# Patient Record
Sex: Female | Born: 1940 | Race: White | Hispanic: No | Marital: Married | Smoking: Never smoker
Health system: Southern US, Community
[De-identification: ages and names within clinical notes are randomized; demographics above are authoritative.]

## PROBLEM LIST (undated history)

## (undated) DIAGNOSIS — Z8719 Personal history of other diseases of the digestive system: Secondary | ICD-10-CM

## (undated) DIAGNOSIS — W19XXXA Unspecified fall, initial encounter: Secondary | ICD-10-CM

## (undated) DIAGNOSIS — J45909 Unspecified asthma, uncomplicated: Secondary | ICD-10-CM

## (undated) DIAGNOSIS — D126 Benign neoplasm of colon, unspecified: Secondary | ICD-10-CM

## (undated) DIAGNOSIS — E119 Type 2 diabetes mellitus without complications: Secondary | ICD-10-CM

## (undated) DIAGNOSIS — J309 Allergic rhinitis, unspecified: Secondary | ICD-10-CM

## (undated) DIAGNOSIS — I10 Essential (primary) hypertension: Secondary | ICD-10-CM

## (undated) DIAGNOSIS — K317 Polyp of stomach and duodenum: Secondary | ICD-10-CM

## (undated) DIAGNOSIS — K7460 Unspecified cirrhosis of liver: Secondary | ICD-10-CM

## (undated) DIAGNOSIS — Z8659 Personal history of other mental and behavioral disorders: Secondary | ICD-10-CM

## (undated) DIAGNOSIS — K219 Gastro-esophageal reflux disease without esophagitis: Secondary | ICD-10-CM

## (undated) DIAGNOSIS — R9431 Abnormal electrocardiogram [ECG] [EKG]: Secondary | ICD-10-CM

## (undated) DIAGNOSIS — D696 Thrombocytopenia, unspecified: Secondary | ICD-10-CM

## (undated) DIAGNOSIS — I2510 Atherosclerotic heart disease of native coronary artery without angina pectoris: Secondary | ICD-10-CM

## (undated) DIAGNOSIS — R29898 Other symptoms and signs involving the musculoskeletal system: Secondary | ICD-10-CM

## (undated) DIAGNOSIS — Z973 Presence of spectacles and contact lenses: Secondary | ICD-10-CM

## (undated) DIAGNOSIS — K2210 Ulcer of esophagus without bleeding: Secondary | ICD-10-CM

## (undated) DIAGNOSIS — E785 Hyperlipidemia, unspecified: Secondary | ICD-10-CM

## (undated) DIAGNOSIS — G8929 Other chronic pain: Secondary | ICD-10-CM

## (undated) DIAGNOSIS — M25519 Pain in unspecified shoulder: Secondary | ICD-10-CM

## (undated) DIAGNOSIS — K2950 Unspecified chronic gastritis without bleeding: Secondary | ICD-10-CM

## (undated) DIAGNOSIS — D61818 Other pancytopenia: Secondary | ICD-10-CM

## (undated) DIAGNOSIS — R42 Dizziness and giddiness: Secondary | ICD-10-CM

## (undated) DIAGNOSIS — K859 Acute pancreatitis without necrosis or infection, unspecified: Secondary | ICD-10-CM

## (undated) DIAGNOSIS — E669 Obesity, unspecified: Secondary | ICD-10-CM

## (undated) DIAGNOSIS — G609 Hereditary and idiopathic neuropathy, unspecified: Secondary | ICD-10-CM

## (undated) HISTORY — PX: OTHER SURGICAL HISTORY: SHX169

## (undated) HISTORY — DX: Polyp of stomach and duodenum: K31.7

## (undated) HISTORY — DX: Personal history of other mental and behavioral disorders: Z86.59

## (undated) HISTORY — PX: ABDOMINAL HYSTERECTOMY: SHX81

## (undated) HISTORY — DX: Benign neoplasm of colon, unspecified: D12.6

## (undated) HISTORY — PX: BREAST SURGERY: SHX581

## (undated) HISTORY — DX: Obesity, unspecified: E66.9

## (undated) HISTORY — DX: Unspecified chronic gastritis without bleeding: K29.50

## (undated) HISTORY — DX: Thrombocytopenia, unspecified: D69.6

## (undated) HISTORY — DX: Unspecified fall, initial encounter: W19.XXXA

## (undated) HISTORY — PX: PARTIAL GASTRECTOMY: SHX2172

## (undated) HISTORY — DX: Hyperlipidemia, unspecified: E78.5

## (undated) HISTORY — PX: TUBAL LIGATION: SHX77

## (undated) HISTORY — PX: EYE SURGERY: SHX253

## (undated) HISTORY — DX: Hereditary and idiopathic neuropathy, unspecified: G60.9

## (undated) HISTORY — DX: Essential (primary) hypertension: I10

## (undated) HISTORY — DX: Unspecified asthma, uncomplicated: J45.909

## (undated) HISTORY — DX: Personal history of other diseases of the digestive system: Z87.19

## (undated) HISTORY — DX: Ulcer of esophagus without bleeding: K22.10

## (undated) HISTORY — DX: Atherosclerotic heart disease of native coronary artery without angina pectoris: I25.10

## (undated) HISTORY — DX: Allergic rhinitis, unspecified: J30.9

## (undated) HISTORY — DX: Type 2 diabetes mellitus without complications: E11.9

---

## 1999-09-13 ENCOUNTER — Ambulatory Visit (HOSPITAL_COMMUNITY): Admission: RE | Admit: 1999-09-13 | Discharge: 1999-09-13 | Payer: Self-pay | Admitting: Specialist

## 2000-10-09 ENCOUNTER — Encounter (INDEPENDENT_AMBULATORY_CARE_PROVIDER_SITE_OTHER): Payer: Self-pay | Admitting: Specialist

## 2000-10-09 ENCOUNTER — Other Ambulatory Visit: Admission: RE | Admit: 2000-10-09 | Discharge: 2000-10-09 | Payer: Self-pay | Admitting: Gastroenterology

## 2001-06-11 ENCOUNTER — Ambulatory Visit (HOSPITAL_COMMUNITY): Admission: RE | Admit: 2001-06-11 | Discharge: 2001-06-11 | Payer: Self-pay | Admitting: Specialist

## 2001-06-16 ENCOUNTER — Ambulatory Visit (HOSPITAL_COMMUNITY): Admission: RE | Admit: 2001-06-16 | Discharge: 2001-06-16 | Payer: Self-pay | Admitting: Specialist

## 2001-08-21 ENCOUNTER — Ambulatory Visit (HOSPITAL_COMMUNITY): Admission: RE | Admit: 2001-08-21 | Discharge: 2001-08-21 | Payer: Self-pay

## 2001-11-12 HISTORY — PX: FOOT SURGERY: SHX648

## 2001-11-12 HISTORY — PX: TUMOR EXCISION: SHX421

## 2002-12-30 ENCOUNTER — Encounter: Payer: Self-pay | Admitting: *Deleted

## 2002-12-30 ENCOUNTER — Inpatient Hospital Stay (HOSPITAL_COMMUNITY): Admission: AD | Admit: 2002-12-30 | Discharge: 2002-12-31 | Payer: Self-pay | Admitting: *Deleted

## 2003-12-01 ENCOUNTER — Emergency Department (HOSPITAL_COMMUNITY): Admission: EM | Admit: 2003-12-01 | Discharge: 2003-12-01 | Payer: Self-pay | Admitting: Emergency Medicine

## 2007-02-24 ENCOUNTER — Inpatient Hospital Stay (HOSPITAL_COMMUNITY): Admission: EM | Admit: 2007-02-24 | Discharge: 2007-02-25 | Payer: Self-pay | Admitting: *Deleted

## 2007-02-25 HISTORY — PX: CARDIAC CATHETERIZATION: SHX172

## 2007-07-29 ENCOUNTER — Ambulatory Visit (HOSPITAL_COMMUNITY): Admission: RE | Admit: 2007-07-29 | Discharge: 2007-07-29 | Payer: Self-pay | Admitting: Internal Medicine

## 2007-10-15 ENCOUNTER — Ambulatory Visit (HOSPITAL_COMMUNITY): Admission: RE | Admit: 2007-10-15 | Discharge: 2007-10-15 | Payer: Self-pay | Admitting: Internal Medicine

## 2008-03-19 ENCOUNTER — Ambulatory Visit (HOSPITAL_COMMUNITY): Admission: RE | Admit: 2008-03-19 | Discharge: 2008-03-19 | Payer: Self-pay | Admitting: Internal Medicine

## 2009-01-13 ENCOUNTER — Ambulatory Visit: Payer: Self-pay | Admitting: Cardiology

## 2009-02-04 DIAGNOSIS — E669 Obesity, unspecified: Secondary | ICD-10-CM | POA: Insufficient documentation

## 2009-02-04 DIAGNOSIS — I10 Essential (primary) hypertension: Secondary | ICD-10-CM | POA: Insufficient documentation

## 2009-02-04 DIAGNOSIS — E785 Hyperlipidemia, unspecified: Secondary | ICD-10-CM

## 2009-02-04 DIAGNOSIS — E119 Type 2 diabetes mellitus without complications: Secondary | ICD-10-CM | POA: Insufficient documentation

## 2009-02-18 ENCOUNTER — Ambulatory Visit: Payer: Self-pay | Admitting: Pulmonary Disease

## 2009-02-18 DIAGNOSIS — R0602 Shortness of breath: Secondary | ICD-10-CM

## 2009-02-18 DIAGNOSIS — J309 Allergic rhinitis, unspecified: Secondary | ICD-10-CM | POA: Insufficient documentation

## 2009-03-16 ENCOUNTER — Ambulatory Visit: Payer: Self-pay | Admitting: Pulmonary Disease

## 2009-03-16 DIAGNOSIS — J45909 Unspecified asthma, uncomplicated: Secondary | ICD-10-CM | POA: Insufficient documentation

## 2009-05-18 ENCOUNTER — Ambulatory Visit: Payer: Self-pay | Admitting: Pulmonary Disease

## 2009-12-04 ENCOUNTER — Observation Stay (HOSPITAL_COMMUNITY): Admission: EM | Admit: 2009-12-04 | Discharge: 2009-12-06 | Payer: Self-pay | Admitting: Emergency Medicine

## 2009-12-20 ENCOUNTER — Ambulatory Visit (HOSPITAL_COMMUNITY): Admission: RE | Admit: 2009-12-20 | Discharge: 2009-12-20 | Payer: Self-pay | Admitting: Family Medicine

## 2010-01-04 DIAGNOSIS — D696 Thrombocytopenia, unspecified: Secondary | ICD-10-CM

## 2010-01-04 DIAGNOSIS — G609 Hereditary and idiopathic neuropathy, unspecified: Secondary | ICD-10-CM

## 2010-01-04 DIAGNOSIS — Z8659 Personal history of other mental and behavioral disorders: Secondary | ICD-10-CM

## 2010-01-04 DIAGNOSIS — I2510 Atherosclerotic heart disease of native coronary artery without angina pectoris: Secondary | ICD-10-CM

## 2010-01-21 ENCOUNTER — Observation Stay (HOSPITAL_COMMUNITY): Admission: EM | Admit: 2010-01-21 | Discharge: 2010-01-24 | Payer: Self-pay | Admitting: Emergency Medicine

## 2010-01-21 ENCOUNTER — Ambulatory Visit: Payer: Self-pay | Admitting: Cardiology

## 2010-01-23 ENCOUNTER — Ambulatory Visit: Payer: Self-pay | Admitting: Surgery

## 2010-01-23 ENCOUNTER — Encounter (INDEPENDENT_AMBULATORY_CARE_PROVIDER_SITE_OTHER): Payer: Self-pay | Admitting: Internal Medicine

## 2010-01-25 ENCOUNTER — Inpatient Hospital Stay (HOSPITAL_COMMUNITY): Admission: EM | Admit: 2010-01-25 | Discharge: 2010-01-28 | Payer: Self-pay | Admitting: Emergency Medicine

## 2010-02-03 ENCOUNTER — Encounter: Payer: Self-pay | Admitting: Internal Medicine

## 2010-02-07 ENCOUNTER — Ambulatory Visit: Payer: Self-pay | Admitting: Internal Medicine

## 2010-02-07 DIAGNOSIS — K7460 Unspecified cirrhosis of liver: Secondary | ICD-10-CM | POA: Insufficient documentation

## 2010-02-10 ENCOUNTER — Encounter: Payer: Self-pay | Admitting: Internal Medicine

## 2010-02-13 ENCOUNTER — Telehealth (INDEPENDENT_AMBULATORY_CARE_PROVIDER_SITE_OTHER): Payer: Self-pay | Admitting: *Deleted

## 2010-02-13 DIAGNOSIS — Z860100 Personal history of colon polyps, unspecified: Secondary | ICD-10-CM | POA: Insufficient documentation

## 2010-02-13 LAB — CONVERTED CEMR LAB
ALT: 24 units/L (ref 0–35)
AST: 24 units/L (ref 0–37)
Albumin: 4.1 g/dL (ref 3.5–5.2)
Alkaline Phosphatase: 84 units/L (ref 39–117)
BUN: 13 mg/dL (ref 6–23)
Basophils Absolute: 0.1 10*3/uL (ref 0.0–0.1)
Basophils Relative: 1 % (ref 0–1)
Ceruloplasmin: 35 mg/dL (ref 21–63)
Creatinine, Ser: 0.58 mg/dL (ref 0.40–1.20)
Eosinophils Relative: 3 % (ref 0–5)
Glucose, Bld: 172 mg/dL — ABNORMAL HIGH (ref 70–99)
Hepatitis B Surface Ag: NEGATIVE
INR: 1.13 (ref <1.50)
Iron: 136 ug/dL (ref 42–145)
Lymphs Abs: 2 10*3/uL (ref 0.7–4.0)
MCHC: 34.1 g/dL (ref 30.0–36.0)
Monocytes Absolute: 0.3 10*3/uL (ref 0.1–1.0)
Neutro Abs: 2.5 10*3/uL (ref 1.7–7.7)
Prothrombin Time: 14.4 s (ref 11.6–15.2)
Saturation Ratios: 41 % (ref 20–55)
Sodium: 140 meq/L (ref 135–145)
TIBC: 332 ug/dL (ref 250–470)
Total Protein: 6.2 g/dL (ref 6.0–8.3)
UIBC: 196 ug/dL
WBC: 5 10*3/uL (ref 4.0–10.5)

## 2010-02-14 ENCOUNTER — Encounter: Payer: Self-pay | Admitting: Internal Medicine

## 2010-03-02 ENCOUNTER — Telehealth (INDEPENDENT_AMBULATORY_CARE_PROVIDER_SITE_OTHER): Payer: Self-pay

## 2010-03-08 ENCOUNTER — Ambulatory Visit (HOSPITAL_COMMUNITY): Admission: RE | Admit: 2010-03-08 | Discharge: 2010-03-08 | Payer: Self-pay | Admitting: Internal Medicine

## 2010-03-08 ENCOUNTER — Ambulatory Visit: Payer: Self-pay | Admitting: Internal Medicine

## 2010-03-08 DIAGNOSIS — D126 Benign neoplasm of colon, unspecified: Secondary | ICD-10-CM

## 2010-03-08 DIAGNOSIS — Z8719 Personal history of other diseases of the digestive system: Secondary | ICD-10-CM

## 2010-03-08 DIAGNOSIS — K2210 Ulcer of esophagus without bleeding: Secondary | ICD-10-CM

## 2010-03-08 DIAGNOSIS — K317 Polyp of stomach and duodenum: Secondary | ICD-10-CM

## 2010-03-08 HISTORY — PX: ESOPHAGOGASTRODUODENOSCOPY: SHX1529

## 2010-03-08 HISTORY — PX: COLONOSCOPY: SHX174

## 2010-03-08 HISTORY — DX: Benign neoplasm of colon, unspecified: D12.6

## 2010-03-08 HISTORY — DX: Polyp of stomach and duodenum: K31.7

## 2010-03-08 HISTORY — DX: Personal history of other diseases of the digestive system: Z87.19

## 2010-03-08 HISTORY — DX: Ulcer of esophagus without bleeding: K22.10

## 2010-03-10 ENCOUNTER — Encounter: Payer: Self-pay | Admitting: Internal Medicine

## 2010-04-11 ENCOUNTER — Ambulatory Visit: Payer: Self-pay | Admitting: Internal Medicine

## 2010-09-27 ENCOUNTER — Ambulatory Visit: Payer: Self-pay | Admitting: Internal Medicine

## 2010-09-29 DIAGNOSIS — K7581 Nonalcoholic steatohepatitis (NASH): Secondary | ICD-10-CM

## 2010-09-29 DIAGNOSIS — K219 Gastro-esophageal reflux disease without esophagitis: Secondary | ICD-10-CM | POA: Insufficient documentation

## 2010-09-29 DIAGNOSIS — K7460 Unspecified cirrhosis of liver: Secondary | ICD-10-CM | POA: Insufficient documentation

## 2010-10-04 ENCOUNTER — Encounter: Payer: Self-pay | Admitting: Internal Medicine

## 2010-11-28 ENCOUNTER — Telehealth (INDEPENDENT_AMBULATORY_CARE_PROVIDER_SITE_OTHER): Payer: Self-pay

## 2010-12-05 ENCOUNTER — Encounter: Payer: Self-pay | Admitting: Internal Medicine

## 2010-12-08 ENCOUNTER — Ambulatory Visit (HOSPITAL_COMMUNITY)
Admission: RE | Admit: 2010-12-08 | Discharge: 2010-12-08 | Payer: Self-pay | Source: Home / Self Care | Attending: Internal Medicine | Admitting: Internal Medicine

## 2010-12-12 ENCOUNTER — Encounter: Payer: Self-pay | Admitting: Internal Medicine

## 2010-12-12 NOTE — Letter (Signed)
Summary: REFERRAL FROM [facility]  REFERRAL FROM [facility]   Imported By: Rexene Alberts 10/04/2010 16:00:49  _____________________________________________________________________  External Attachment:    Type:   Image     Comment:   External Document

## 2010-12-12 NOTE — Assessment & Plan Note (Signed)
Summary: CIRRHOSIS/SS   Visit Type:  Follow-up Visit Primary Care Provider:  golding  Chief Complaint:  follow up visit- doing ok.  History of Present Illness: 70 year old lady with [name] [name] cirrhosis returns for 6 month followup. She's done well. Her ammonia was slightly elevated recently and her lactulose was increased. She takes 30 cc daily. Husband states she's been doing well; she has had no melena, hematochezia or  hematemesis. History of colonic adenoma. Due for surveillance exam for half years from now. She'll be due for a screening esophageal varices one year from now. She's been vaccinated against hepatitis A and B.  She has cut back on her dietary intake.  Reflux symptoms well-controlled.  Current Problems (verified): 1)  Colonic Polyps, Hx of  (ICD-V12.72) 2)  Cirrhosis  (ICD-571.5) 3)  Cad, Native Vessel  (ICD-414.01) 4)  Thrombocytopenia, Chronic  (ICD-287.5) 5)  Peripheral Neuropathy  (ICD-356.9) 6)  Accidental Falls, Recurrent  (ICD-E888.9) 7)  Hyperlipidemia  (ICD-272.4) 8)  Hypertension  (ICD-401.9) 9)  Obesity  (ICD-278.00) 10)  Dyspnea  (ICD-786.05) 11)  Anxiety Disorder, Hx of  (ICD-V11.2) 12)  Diabetes, Type 2  (ICD-250.00) 13)  Intrinsic Asthma, Unspecified  (ICD-493.10) 14)  Allergic Rhinitis  (ICD-477.9)  Current Medications (verified): 1)  Bayer Low Strength 81 Mg Tbec (Aspirin) .... Take 1 Tablet By Mouth Once A Day 2)  Centrum Silver  Tabs (Multiple Vitamins-Minerals) .... Take 1 Tablet By Mouth Once A Day 3)  Novolog 100 Unit/ml Soln (Insulin Aspart) .... Sliding Scale 4)  Metoprolol Tartrate 25 Mg Tabs (Metoprolol Tartrate) .Marland Kitchen.. 1 Tab At Bedtime 5)  Symbicort 80-4.5 Mcg/act Aero (Budesonide-Formoterol Fumarate) .... 2 Puffs Two Times A Day 6)  Nexium 40 Mg Cpdr (Esomeprazole Magnesium) .Marland Kitchen.. 1 Tab Once Daily 7)  Alprazolam 0.5 Mg Tabs (Alprazolam) .Marland Kitchen.. 1 Tab Every 8 Hours As Needed 8)  Novolin N 100 Unit/ml Susp (Insulin Isophane Human) .... 70 Units in Am  60 Units Q Pm 9)  Lactulose Encephalopathy 10 Gm/97ml Soln (Lactulose Encephalopathy) .... 30 Ml Once Daily  Allergies (verified): 1)  ! Compazine  Past History:  Past Medical History: Last updated: 02/07/2010 CAD, NATIVE VESSEL (ICD-414.01) THROMBOCYTOPENIA, CHRONIC (ICD-287.5) PERIPHERAL NEUROPATHY (ICD-356.9) ACCIDENTAL FALLS, RECURRENT (ICD-E888.9) HYPERLIPIDEMIA (ICD-272.4) HYPERTENSION (ICD-401.9) OBESITY (ICD-278.00) DYSPNEA (ICD-786.05) ANXIETY DISORDER, HX OF (ICD-V11.2) DIABETES, TYPE 2 (ICD-250.00) INTRINSIC ASTHMA, UNSPECIFIED (ICD-493.10) ALLERGIC RHINITIS (ICD-477.9) high amonia levels  Past Surgical History: Last updated: 02/18/2009 s/p hysterectomy and btl  Family History: Last updated: 01/04/2010 Family history of CAD and diabetes heart disease: mother, father  Mainly coronary artery disease and cancer.  Also,   diabetes runs in her family.  The patient has 3 children and many   grandchildren, all relatively healthy.   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 She is accompanied by her Lungs:  clear to auscultation Heart:  regular rate and rhythm without murmur gallop or Abdomen:  obese positive bowel sounds soft nontender liver edge indistinct. Spleen not definitely ballotable. No shifting dullness. The abdomen is soft and nontender  Impression & Recommendations: Impression: 70 year old lady with  [name] [name] cirrhosis well compensated at this time. History of a colonic adenoma. No prior varices on EGD. She may have had some low-grade encephalopathy since she was last seen. She's tolerating her lactulose veery welll.  Recommendations: Continue lactulose as per plan. She may take an extra dose during the day if she doesn't have 3-4 semiformed BM's daily. Plan for repeat colonoscopy four and a  half years. EGD in one year. Would like to retrieve the labs done through Dr. Beatrice Lecher office. She may be due for ultrasound and alpha-fetoprotein tumor marker now.  Further recommendations to follow.  Other Orders: Est. Patient Level IV ([address]109)

## 2010-12-12 NOTE — Letter (Signed)
Summary: Patient Notice, Colon Biopsy Results  [facility]  [address]   [place], [place] [address]   Phone: [phone]  Fax: [phone]       March 10, 2010   [name] [name] [address] [place], [place]  [address] Mar 19, 1941    Dear Ms. Ostergaard,  I am pleased to inform you that the biopsies taken during your recent colonoscopy did not show any evidence of cancer upon pathologic examination. Stomach biopsies revealed only mild inflammation.  Additional information/recommendations:  Continue with the treatment plan as outlined on the day of your exam.  You should have a repeat colonoscopy examination  in 5 years.  You should have a repeat EGD to check for esophageal varices in 18 months  Please call us if you are having persistent problems or have questions about your condition that have not been fully answered at this time.  Sincerely,    R. Roetta Sessions MD, FACP [facility] Ph: [phone]    Fax: [phone]   Appended Document: Patient Notice, Colon Biopsy Results letter mailed to pt  Appended Document: Patient Notice, Colon Biopsy Results Reminder appts made for EGD & TCS- cdg

## 2010-12-12 NOTE — Progress Notes (Signed)
Summary: pt came by for prep for tcs/instructions  Phone Note Call from Patient   Caller: Patient Summary of Call: Pt came by to pick-up prep and instructions. Reviewed the instructions with her. She asked about her insulin, and said she has  not been having to use it for over a week. BS running around 120. I told her i would let Dr. Darrick Penna know since Dr. Jena Gauss is out of town. She also said she is having alot of cramps in her legs and thinks her potassium might be low. Please advise. Initial call taken by: Cloria Spring LPN,  March 02, 2010 2:25 PM     Appended Document: pt came by for prep for tcs/instructions Please call pt. If she is not using her insulin then no adjustment needs to be made. If she thinks her potassium is low she should see her PCP for a blood draw.  Appended Document: pt came by for prep for tcs/instructions LM for pt to call.  Appended Document: pt came by for prep for tcs/instructions Pt informed. She will try to see someone at [place] today.

## 2010-12-12 NOTE — Assessment & Plan Note (Signed)
Summary: f/u GERD   Visit Type:  Follow-up Consult Primary Care Provider:  cresenzo  Chief Complaint:  follow up- gerd and high amonia levels.  History of Present Illness: 70 year old lady sent over out of the courtesy of Dr. Nobie Putnam to further evaluate a couple month history of intermittent confusion, word finding difficulties, vertigo symptoms and an elevated ammonia level. She is has been seen at the emergency department on multiple occasions. She has been seen by 2 different neurologistss.  Recently hospitalized here and was found to have a nodular liver and small gallstones and an enlarged spleen.  She has had intermittent episodes of confusion. She was started on lactulose syrup and dose been titrated. She takes 3 tablespoons 2-3 times daily and has 1-2 formed bowel movements daily. Recently, her blood work from  Dr. Geanie Logan office indicated her ammonia was elevated at 96  LFTs were good except for a slightly elevated total bilirubin indirect and direct component. So far she knows, she has not had a serological workup as of yet in determining the cause of cirrhosis. She is a long-term obese diabetic. She denies any history of yellow jaundice or or been told previously she had liver disease; she gives a history a single blood transfusion as child in [place] for "not eating".  There is no history of alcohol consumption;  no family history of liver disease. There is no history of illicit/ parenteral drug use.   During the last couple months of workup she has become quite anxious and agitated at times. She has been started on alprazolam which he takes at bedtime.  Past GI history is significant for colonic polyps. She previously had a colonoscopy by Dr. Sheryn Bison down in [place]. She is way overdue for surveillance exam as she reports.   Current Medications (verified): 1)  Bayer Low Strength 81 Mg Tbec (Aspirin) .... Take 1 Tablet By Mouth Once A Day 2)  Centrum Silver   Tabs (Multiple Vitamins-Minerals) .... Take 1 Tablet By Mouth Once A Day 3)  Novolog 100 Unit/ml Soln (Insulin Aspart) .... Sliding Scale 4)  Humulin N 100 Unit/ml Susp (Insulin Isophane Human) .... Use As Directed 5)  Metoprolol Tartrate 25 Mg Tabs (Metoprolol Tartrate) .Marland Kitchen.. 1 Tab At Bedtime 6)  Symbicort 80-4.5 Mcg/act Aero (Budesonide-Formoterol Fumarate) .... 2 Puffs Two Times A Day 7)  Nexium 40 Mg Cpdr (Esomeprazole Magnesium) .Marland Kitchen.. 1 Tab Once Daily 8)  Alprazolam 0.5 Mg Tabs (Alprazolam) .Marland Kitchen.. 1 Tab Every 8 Hours As Needed 9)  Novolin N 100 Unit/ml Susp (Insulin Isophane Human) .... 60 Units Three Times A Day 10)  Lactulose Encephalopathy 10 Gm/20ml Soln (Lactulose Encephalopathy) .... 30 Ml Two Times A Day  Allergies (verified): 1)  ! Compazine  Past History:  Past Surgical History: Last updated: 02/18/2009 s/p hysterectomy and btl  Risk Factors: Smoking Status: never (02/18/2009)  Past Medical History: CAD, NATIVE VESSEL (ICD-414.01) THROMBOCYTOPENIA, CHRONIC (ICD-287.5) PERIPHERAL NEUROPATHY (ICD-356.9) ACCIDENTAL FALLS, RECURRENT (ICD-E888.9) HYPERLIPIDEMIA (ICD-272.4) HYPERTENSION (ICD-401.9) OBESITY (ICD-278.00) DYSPNEA (ICD-786.05) ANXIETY DISORDER, HX OF (ICD-V11.2) DIABETES, TYPE 2 (ICD-250.00) INTRINSIC ASTHMA, UNSPECIFIED (ICD-493.10) ALLERGIC RHINITIS (ICD-477.9) high amonia levels  Family History: Reviewed history from 01/04/2010 and no changes required. Family history of CAD and diabetes heart disease: mother, father  Mainly coronary artery disease and cancer.  Also,   diabetes runs in her family.  The patient has 3 children and many   grandchildren, all relatively healthy.   Social History: Reviewed history from 01/04/2010 and no changes required. 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 not jaundiced. Do not see much in the way of any spider angiomata. She does have mild asterixis( right greater than left) Eyes:  no scleral icterus present time are pink Lungs:  clear to auscultation Heart:  regular rate and rhythm without murmur gallop rub Abdomen:  obese positive bowel sounds soft no shifting dullness or fluid wave. Abdomen is nontender. Spleen is ballotable. Liver is not enlarged by percussion. She's a right costal margin Rectal:  deferred Colonoscopy  Impression & Recommendations: Impression: Very pleasant 70 year old lady who appears to have a new diagnosis of cirrhosis, etiology not clear. It appears that some of her mental status changes are likely related to encephalopathy. Her ataxia and focal motor deficits may not all be attributable to hepatic encephalopathy. 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 It is  possible she has 2 different processes contributing to her neurological problems. Findings on ultrasound are impressive for cirrhosis. Differential diagnosis  would include steatohepatitis secondary to obesity and diabetes, or occult viral hepatitis B or C., iron overload or burned out autoimmune hepatitis.  Recommendations: Assess hepatitis A BC status; she may need vaccination.  Titrate lactulose to 3-4 semi-formed almost diarrhea stools daily. I've asked her to increase the lactulose to 45 cc orally q.i.d. One or 2 bowel movements daily is not sufficient in this setting.  Baseline alpha-fetoprotein tumor marker  Serological workup for cause of liver disease including  hepatitis B. surface antigen hepatitis C antibody, iron studies, repeat chem 20 and CBC today along with autoimmune markers including ANA and anti-smooth muscle antibody along her serum immunoglobulins  She has a history of colonic polyps and is a overdo for surveillance.  She needs to be screened for esophageal varices via  EGD.  I've offered this nice lady both a surveillance EGD and colonoscopy in the near future at the hospital. She states that her colonoscopy last time was a terrible experience; she felt everything and it hurt". Therefore, will in list the services of Dr. Jayme Cloud and associates to provide [place] MAC anesthesia in the OR. Potential for esophageal band ligation has been reviewed;  risks, benefits, alternatives and imponderables have been reviewed. Her questions were answered Her and her husband are agreeable.   We'll like further recommendations in the very near future.  Other Orders: T-AFP Tumor Markers ([id number]) T-Hepatitis B Surface Antigen [phone]) T-Hepatitis B Surface Antibody ([id number]) T-Hepatitis A Antibody ([id number]) T-Hepatitis C Antibody ([id number]) T-Iron Binding Capacity (TIBC) ([address]) T-Ferritin (40347-42595) Augusto Gamble ([id number]) T-ANA [phone]) T-Immunoglobulins, Quantitative (63016) T-Anti SMA ([id number]) T-Comprehensive Metabolic Panel [phone]) T-CBC w/Diff ([id number]) T-PT (Prothrombin Time) (17616) T-Ceruloplasmin ([id number])  Appended Document: Orders Update    Clinical Lists Changes  Problems: Added new problem of COLONIC POLYPS, HX OF (ICD-V12.72) Orders: Added new Service  order of Consultation Level IV [phone]) - Signed

## 2010-12-12 NOTE — Progress Notes (Signed)
Summary: Prep Instructions from Dr Jena Gauss  ---- Converted from flag ---- ---- 02/13/2010 1:02 PM, Jonathon Bellows MD, Caleen Essex wrote: OK... Lets try the new prep and see what happens /thx  ---- 02/13/2010 1:01 PM, Ave Filter wrote: This is the one where the pt stated she couldn't drink Colytley..You said lets wait until her labs come back and you will have a game plan.  ---- 02/13/2010 12:55 PM, R. Roetta Sessions MD, Caleen Essex wrote: what am I missing; I thought we were going to set up both an egd and tcs  ---- 02/13/2010 12:23 PM, Ave Filter wrote: What do you want to do about colonoscopy? ------------------------------

## 2010-12-12 NOTE — Letter (Signed)
Summary: TCS/EGD ORDER  TCS/EGD ORDER   Imported By: Ave Filter 02/14/2010 09:36:36  _____________________________________________________________________  External Attachment:    Type:   Image     Comment:   External Document

## 2010-12-12 NOTE — Assessment & Plan Note (Signed)
Summary: PP FU IN ONE MONTH/PT WANTS RMR ONLY/SS   Chief Complaint:  follow up- doing ok.  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 No varices on recent EGD. She multiple colonic adenomas. She is due for surveillance colonoscopy 2016. Due for screening EGD in 18 months; due for alpha-fetoprotein repeat and ultrasound 6 months from now.  Husband states she is doing well;  taking lactulose as directed having 2-4 bowel movements daily. She has been immunized against hepatitis A and B. viral markers for B. and C. came back negative acid autoimmune and iron markers. She is mildly hypokalemic and that was supplemented. Overall, she feels she is doing well.  Recent CT demonstrated no evidence of hepatoma.  Current Problems (verified): 1)  Colonic Polyps, Hx of  (ICD-V12.72) 2)  Cirrhosis  (ICD-571.5) 3)  Cad, Native Vessel  (ICD-414.01) 4)  Thrombocytopenia, Chronic  (ICD-287.5) 5)  Peripheral Neuropathy  (ICD-356.9) 6)  Accidental Falls, Recurrent  (ICD-E888.9) 7)  Hyperlipidemia  (ICD-272.4) 8)  Hypertension  (ICD-401.9) 9)  Obesity  (ICD-278.00) 10)  Dyspnea  (ICD-786.05) 11)  Anxiety Disorder, Hx of  (ICD-V11.2) 12)  Diabetes, Type 2  (ICD-250.00) 13)  Intrinsic Asthma, Unspecified  (ICD-493.10) 14)  Allergic Rhinitis  (ICD-477.9)  Current Medications (verified): 1)  Bayer Low Strength 81 Mg Tbec (Aspirin) .... Take 1 Tablet By Mouth Once A Day 2)  Centrum Silver  Tabs (Multiple Vitamins-Minerals) .... Take 1 Tablet By Mouth Once A Day 3)  Novolog 100 Unit/ml Soln (Insulin Aspart) .... Sliding Scale 4)  Humulin N 100 Unit/ml Susp (Insulin Isophane Human) .... Use As Directed 5)  Metoprolol Tartrate 25 Mg Tabs (Metoprolol Tartrate) .Marland Kitchen.. 1 Tab At Bedtime 6)  Symbicort 80-4.5 Mcg/act Aero (Budesonide-Formoterol Fumarate) .... 2 Puffs Two Times A Day 7)  Nexium 40 Mg Cpdr (Esomeprazole Magnesium) .Marland Kitchen.. 1 Tab Once Daily 8)   Alprazolam 0.5 Mg Tabs (Alprazolam) .Marland Kitchen.. 1 Tab Every 8 Hours As Needed 9)  Novolin N 100 Unit/ml Susp (Insulin Isophane Human) .... 60 Units Once Daily 10)  Lactulose Encephalopathy 10 Gm/13ml Soln (Lactulose Encephalopathy) .... 30 Ml Once Daily  Allergies (verified): 1)  ! Compazine  Past History:  Past Medical History: Last updated: 02/07/2010 CAD, NATIVE VESSEL (ICD-414.01) THROMBOCYTOPENIA, CHRONIC (ICD-287.5) PERIPHERAL NEUROPATHY (ICD-356.9) ACCIDENTAL FALLS, RECURRENT (ICD-E888.9) HYPERLIPIDEMIA (ICD-272.4) HYPERTENSION (ICD-401.9) OBESITY (ICD-278.00) DYSPNEA (ICD-786.05) ANXIETY DISORDER, HX OF (ICD-V11.2) DIABETES, TYPE 2 (ICD-250.00) INTRINSIC ASTHMA, UNSPECIFIED (ICD-493.10) ALLERGIC RHINITIS (ICD-477.9) high amonia levels  Past Surgical History: Last updated: 02/18/2009 s/p hysterectomy and btl  Family History: Last updated: 01/04/2010 Family history of CAD and diabetes heart disease: mother, father  Mainly coronary artery disease and cancer.  Also,   diabetes runs in her family.  The patient has 3 children and many   grandchildren, all relatively healthy.   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 Accompanied by her husband. Eyes:  no scleral icterus Abdomen:  massively obese positive bowel sounds soft ID Bilotta spleen liver edge palpable right costal margin no obvious fluid wave or shifting dullness she  has trace lower extremity edema  Impression & Recommendations: Impression: Nash/cirrhosis well compensated this time -  no evidence of encephalopathy.  She is to continue her lactulose regimen and titrate to 3-4 bowel movements daily. Ultrasound and AFP in 6 months from now.  EGD 18 months from now.  History of GERD - stable on Nexium continued at regimen.  History of colonic polyps due for surveillance examination 2016.  Office visit here with me 6 months.  Appended Document: Orders Update    Clinical Lists Changes  Orders: Added new Service order of Est. Patient Level III (43329) - Signed      Appended Document: PP FU IN ONE MONTH/PT WANTS RMR ONLY/SS reminder in computer

## 2010-12-14 NOTE — Progress Notes (Addendum)
Summary: ? about labs and ultrasound  Phone Note Call from Patient Call back at Home Phone [phone]   Caller: Patient Summary of Call: pt called- wants to know if she needs to have ultrasound and afp done now. Her labs from [facility] did not include an afp. please advise.  Initial call taken by: Hendricks Limes LPN,  November 28, 2010 10:06 AM     Appended Document: ? about labs and ultrasound I do not know details regarding last u/s or afp; lets go ahead and get both now and then every 6 mos  Appended Document: ? about labs and ultrasound Informed pt. Lab order faxed to [facility] for the AFP and Durward Mallard will call her for the [place] appt.  Appended Document: ? about labs and ultrasound Pt scheduled for [place] on 12/08/10@11 :00a.m. Pt aware of appt.

## 2010-12-14 NOTE — Miscellaneous (Signed)
Summary: Orders Update  Clinical Lists Changes  Orders: Added new Test order of T-AFP Tumor Markers ([id number]) - Signed 

## 2010-12-20 NOTE — Miscellaneous (Signed)
Summary: Orders Update  Clinical Lists Changes  Orders: Added new Test order of T-AFP Tumor Markers ([id number]) - Signed 

## 2011-01-29 LAB — BASIC METABOLIC PANEL
BUN: 12 mg/dL (ref 6–23)
CO2: 24 mEq/L (ref 19–32)
Creatinine, Ser: 0.62 mg/dL (ref 0.4–1.2)
GFR calc non Af Amer: >60 mL/min (ref >60)
Glucose, Bld: 196 mg/dL — ABNORMAL HIGH (ref 70–99)
Potassium: 3.5 mEq/L (ref 3.5–5.1)

## 2011-01-29 LAB — COMPREHENSIVE METABOLIC PANEL
Albumin: 3.4 g/dL — ABNORMAL LOW (ref 3.5–5.2)
Alkaline Phosphatase: 49 U/L (ref 39–117)
BUN: 11 mg/dL (ref 6–23)
Calcium: 8.7 mg/dL (ref 8.4–10.5)
Creatinine, Ser: 0.72 mg/dL (ref 0.4–1.2)
Potassium: 3.7 mEq/L (ref 3.5–5.1)
Total Protein: 5.7 g/dL — ABNORMAL LOW (ref 6.0–8.3)

## 2011-01-29 LAB — CBC
HCT: 36 % (ref 36.0–46.0)
HCT: 37.7 % (ref 36.0–46.0)
Hemoglobin: 13.1 g/dL (ref 12.0–15.0)
MCHC: 34.9 g/dL (ref 30.0–36.0)
Platelets: 100 10*3/uL — ABNORMAL LOW (ref 150–400)
Platelets: 102 10*3/uL — ABNORMAL LOW (ref 150–400)
RBC: 4.32 MIL/uL (ref 3.87–5.11)
RDW: 14 % (ref 11.5–15.5)
RDW: 14.1 % (ref 11.5–15.5)

## 2011-01-29 LAB — DIFFERENTIAL
Basophils Absolute: 0 10*3/uL (ref 0.0–0.1)
Basophils Relative: 1 % (ref 0–1)
Eosinophils Relative: 3 % (ref 0–5)
Lymphocytes Relative: 45 % (ref 12–46)
Lymphs Abs: 1.8 10*3/uL (ref 0.7–4.0)
Monocytes Absolute: 0.3 10*3/uL (ref 0.1–1.0)
Monocytes Absolute: 0.4 10*3/uL (ref 0.1–1.0)
Monocytes Relative: 8 % (ref 3–12)
Neutro Abs: 1.8 10*3/uL (ref 1.7–7.7)

## 2011-01-29 LAB — URINE MICROSCOPIC-ADD ON

## 2011-01-29 LAB — TSH: TSH: 2.395 u[IU]/mL (ref 0.350–4.500)

## 2011-01-29 LAB — GLUCOSE, CAPILLARY
Glucose-Capillary: 172 mg/dL — ABNORMAL HIGH (ref 70–99)
Glucose-Capillary: 210 mg/dL — ABNORMAL HIGH (ref 70–99)
Glucose-Capillary: 264 mg/dL — ABNORMAL HIGH (ref 70–99)

## 2011-01-29 LAB — POCT CARDIAC MARKERS
CKMB, poc: <1 ng/mL — ABNORMAL LOW (ref 1.0–8.0)
Troponin i, poc: <0.05 ng/mL (ref 0.00–0.09)

## 2011-01-29 LAB — LIPID PANEL
LDL Cholesterol: 64 mg/dL (ref 0–99)
Total CHOL/HDL Ratio: 2.5 RATIO
Triglycerides: 104 mg/dL (ref <150)
VLDL: 21 mg/dL (ref 0–40)

## 2011-01-29 LAB — URINALYSIS, ROUTINE W REFLEX MICROSCOPIC
Glucose, UA: 250 mg/dL — AB
pH: 5 (ref 5.0–8.0)

## 2011-01-29 LAB — VITAMIN B12: Vitamin B-12: 395 pg/mL (ref 211–911)

## 2011-01-29 LAB — PROTIME-INR: Prothrombin Time: 14 seconds (ref 11.6–15.2)

## 2011-01-29 LAB — CULTURE, BLOOD (ROUTINE X 2)
Culture: NO GROWTH
Report Status: 1282011
Report Status: 1282011

## 2011-01-29 LAB — HEMOGLOBIN A1C: Mean Plasma Glucose: 189 mg/dL

## 2011-01-29 LAB — HOMOCYSTEINE: Homocysteine: 10.4 umol/L (ref 4.0–15.4)

## 2011-01-30 LAB — GLUCOSE, CAPILLARY: Glucose-Capillary: 186 mg/dL — ABNORMAL HIGH (ref 70–99)

## 2011-02-02 LAB — COMPREHENSIVE METABOLIC PANEL
Albumin: 3.5 g/dL (ref 3.5–5.2)
Albumin: 3.7 g/dL (ref 3.5–5.2)
BUN: 12 mg/dL (ref 6–23)
BUN: 9 mg/dL (ref 6–23)
Chloride: 105 mEq/L (ref 96–112)
Creatinine, Ser: 0.64 mg/dL (ref 0.4–1.2)
Creatinine, Ser: 0.68 mg/dL (ref 0.4–1.2)
Total Bilirubin: 2.4 mg/dL — ABNORMAL HIGH (ref 0.3–1.2)
Total Protein: 6 g/dL (ref 6.0–8.3)
Total Protein: 6.4 g/dL (ref 6.0–8.3)

## 2011-02-02 LAB — CK TOTAL AND CKMB (NOT AT ARMC)
CK, MB: 1 ng/mL (ref 0.3–4.0)
Relative Index: INVALID (ref 0.0–2.5)
Total CK: 69 U/L (ref 7–177)

## 2011-02-02 LAB — GLUCOSE, CAPILLARY
Glucose-Capillary: 113 mg/dL — ABNORMAL HIGH (ref 70–99)
Glucose-Capillary: 130 mg/dL — ABNORMAL HIGH (ref 70–99)
Glucose-Capillary: 135 mg/dL — ABNORMAL HIGH (ref 70–99)
Glucose-Capillary: 159 mg/dL — ABNORMAL HIGH (ref 70–99)
Glucose-Capillary: 190 mg/dL — ABNORMAL HIGH (ref 70–99)
Glucose-Capillary: 240 mg/dL — ABNORMAL HIGH (ref 70–99)
Glucose-Capillary: 254 mg/dL — ABNORMAL HIGH (ref 70–99)
Glucose-Capillary: 293 mg/dL — ABNORMAL HIGH (ref 70–99)

## 2011-02-02 LAB — CBC
HCT: 40.3 % (ref 36.0–46.0)
MCHC: 35.1 g/dL (ref 30.0–36.0)
MCV: 89.1 fL (ref 78.0–100.0)
MCV: 89.5 fL (ref 78.0–100.0)
Platelets: 132 10*3/uL — ABNORMAL LOW (ref 150–400)
Platelets: 135 10*3/uL — ABNORMAL LOW (ref 150–400)
Platelets: 146 10*3/uL — ABNORMAL LOW (ref 150–400)
RDW: 13.7 % (ref 11.5–15.5)
RDW: 13.7 % (ref 11.5–15.5)
RDW: 14.1 % (ref 11.5–15.5)
WBC: 6.8 10*3/uL (ref 4.0–10.5)

## 2011-02-02 LAB — TROPONIN I: Troponin I: 0.03 ng/mL (ref 0.00–0.06)

## 2011-02-02 LAB — URINALYSIS, ROUTINE W REFLEX MICROSCOPIC
Bilirubin Urine: NEGATIVE
Ketones, ur: NEGATIVE mg/dL
Nitrite: NEGATIVE
Protein, ur: NEGATIVE mg/dL
Specific Gravity, Urine: 1.018 (ref 1.005–1.030)
Urobilinogen, UA: 2 mg/dL — ABNORMAL HIGH (ref 0.0–1.0)

## 2011-02-02 LAB — FOLATE: Folate: 7.4 ng/mL

## 2011-02-02 LAB — DIFFERENTIAL
Basophils Absolute: 0 10*3/uL (ref 0.0–0.1)
Basophils Absolute: 0 10*3/uL (ref 0.0–0.1)
Lymphocytes Relative: 29 % (ref 12–46)
Lymphocytes Relative: 37 % (ref 12–46)
Monocytes Absolute: 0.5 10*3/uL (ref 0.1–1.0)
Monocytes Absolute: 0.6 10*3/uL (ref 0.1–1.0)
Monocytes Relative: 9 % (ref 3–12)
Neutro Abs: 3.1 10*3/uL (ref 1.7–7.7)
Neutro Abs: 3.2 10*3/uL (ref 1.7–7.7)

## 2011-02-02 LAB — BASIC METABOLIC PANEL
BUN: 17 mg/dL (ref 6–23)
Chloride: 111 mEq/L (ref 96–112)
Creatinine, Ser: 0.76 mg/dL (ref 0.4–1.2)
Glucose, Bld: 115 mg/dL — ABNORMAL HIGH (ref 70–99)

## 2011-02-02 LAB — VITAMIN B12: Vitamin B-12: 426 pg/mL (ref 211–911)

## 2011-02-02 LAB — HEMOGLOBIN A1C: Mean Plasma Glucose: 169 mg/dL

## 2011-02-02 LAB — PROTIME-INR: Prothrombin Time: 14.7 seconds (ref 11.6–15.2)

## 2011-02-02 LAB — APTT: aPTT: 37 seconds (ref 24–37)

## 2011-02-05 LAB — GLUCOSE, CAPILLARY
Glucose-Capillary: 111 mg/dL — ABNORMAL HIGH (ref 70–99)
Glucose-Capillary: 120 mg/dL — ABNORMAL HIGH (ref 70–99)
Glucose-Capillary: 130 mg/dL — ABNORMAL HIGH (ref 70–99)
Glucose-Capillary: 177 mg/dL — ABNORMAL HIGH (ref 70–99)
Glucose-Capillary: 229 mg/dL — ABNORMAL HIGH (ref 70–99)
Glucose-Capillary: 229 mg/dL — ABNORMAL HIGH (ref 70–99)
Glucose-Capillary: 340 mg/dL — ABNORMAL HIGH (ref 70–99)
Glucose-Capillary: 95 mg/dL (ref 70–99)

## 2011-02-05 LAB — POCT CARDIAC MARKERS: CKMB, poc: <1 ng/mL — ABNORMAL LOW (ref 1.0–8.0)

## 2011-02-05 LAB — ANA: Anti Nuclear Antibody(ANA): NEGATIVE

## 2011-02-05 LAB — BLOOD GAS, ARTERIAL
Bicarbonate: 20.4 mEq/L (ref 20.0–24.0)
TCO2: 17.5 mmol/L (ref 0–100)
pCO2 arterial: 27.6 mmHg — ABNORMAL LOW (ref 35.0–45.0)
pH, Arterial: 7.481 — ABNORMAL HIGH (ref 7.350–7.400)

## 2011-02-05 LAB — COMPREHENSIVE METABOLIC PANEL
AST: 20 U/L (ref 0–37)
AST: 24 U/L (ref 0–37)
Albumin: 3.7 g/dL (ref 3.5–5.2)
Albumin: 3.7 g/dL (ref 3.5–5.2)
Alkaline Phosphatase: 50 U/L (ref 39–117)
CO2: 19 mEq/L (ref 19–32)
Calcium: 9.3 mg/dL (ref 8.4–10.5)
Chloride: 111 mEq/L (ref 96–112)
Creatinine, Ser: 0.73 mg/dL (ref 0.4–1.2)
Creatinine, Ser: 0.88 mg/dL (ref 0.4–1.2)
GFR calc Af Amer: >60 mL/min (ref >60)
GFR calc Af Amer: >60 mL/min (ref >60)
GFR calc non Af Amer: >60 mL/min (ref >60)
Potassium: 3.7 mEq/L (ref 3.5–5.1)
Total Bilirubin: 3 mg/dL — ABNORMAL HIGH (ref 0.3–1.2)
Total Protein: 6.2 g/dL (ref 6.0–8.3)

## 2011-02-05 LAB — RAPID URINE DRUG SCREEN, HOSP PERFORMED
Amphetamines: NOT DETECTED
Barbiturates: NOT DETECTED
Benzodiazepines: POSITIVE — AB
Opiates: NOT DETECTED

## 2011-02-05 LAB — CARDIAC PANEL(CRET KIN+CKTOT+MB+TROPI)
CK, MB: 1 ng/mL (ref 0.3–4.0)
CK, MB: 1 ng/mL (ref 0.3–4.0)
CK, MB: 1 ng/mL (ref 0.3–4.0)
Relative Index: INVALID (ref 0.0–2.5)
Total CK: 63 U/L (ref 7–177)
Total CK: 68 U/L (ref 7–177)
Troponin I: 0.01 ng/mL (ref 0.00–0.06)
Troponin I: <0.01 ng/mL (ref 0.00–0.06)

## 2011-02-05 LAB — PROTIME-INR: INR: 1.11 (ref 0.00–1.49)

## 2011-02-05 LAB — DIFFERENTIAL
Basophils Absolute: 0 10*3/uL (ref 0.0–0.1)
Eosinophils Relative: 3 % (ref 0–5)
Eosinophils Relative: 3 % (ref 0–5)
Lymphocytes Relative: 32 % (ref 12–46)
Lymphocytes Relative: 38 % (ref 12–46)
Lymphs Abs: 1.7 10*3/uL (ref 0.7–4.0)
Monocytes Absolute: 0.5 10*3/uL (ref 0.1–1.0)
Monocytes Relative: 8 % (ref 3–12)

## 2011-02-05 LAB — URINALYSIS, ROUTINE W REFLEX MICROSCOPIC
Bilirubin Urine: NEGATIVE
Hgb urine dipstick: NEGATIVE
Ketones, ur: NEGATIVE mg/dL
Nitrite: NEGATIVE
Protein, ur: NEGATIVE mg/dL
Urobilinogen, UA: 1 mg/dL (ref 0.0–1.0)

## 2011-02-05 LAB — CBC
MCHC: 35.5 g/dL (ref 30.0–36.0)
MCV: 86.8 fL (ref 78.0–100.0)
Platelets: 139 10*3/uL — ABNORMAL LOW (ref 150–400)
Platelets: 156 10*3/uL (ref 150–400)
RDW: 13.4 % (ref 11.5–15.5)
WBC: 6 10*3/uL (ref 4.0–10.5)

## 2011-02-05 LAB — HEPATITIS PANEL, ACUTE: Hepatitis B Surface Ag: NEGATIVE

## 2011-02-05 LAB — APTT: aPTT: 34 seconds (ref 24–37)

## 2011-02-05 LAB — HEMOGLOBIN A1C
Hgb A1c MFr Bld: 6.9 % — ABNORMAL HIGH (ref 4.6–6.1)
Mean Plasma Glucose: 151 mg/dL

## 2011-02-28 ENCOUNTER — Telehealth: Payer: Self-pay | Admitting: Internal Medicine

## 2011-02-28 NOTE — Telephone Encounter (Signed)
May send pt to GI clinic at [facility]

## 2011-03-09 DIAGNOSIS — K2950 Unspecified chronic gastritis without bleeding: Secondary | ICD-10-CM

## 2011-03-09 HISTORY — DX: Unspecified chronic gastritis without bleeding: K29.50

## 2011-03-27 NOTE — Assessment & Plan Note (Signed)
[facility]                            CARDIOLOGY OFFICE NOTE   NAME:Mcglinchey, AMILIANA FOUTZ                       MRN:          [id number]  DATE:01/13/2009                            DOB:          1941/10/16    I was asked by Dr. Patrica Duel to evaluate [name] [name] with shortness  of breath.   HISTORY OF PRESENT ILLNESS:  She is 70 years of age married white female  who has had increased shortness of breath.  She says sometimes she  wheezes at night.  She denies any true reflux symptoms, though there has  been some increased snoring of late.  Her husband has not noticed her  not breathing, however.   She has a history of nonobstructive coronary disease by last cath in  2008, one before that in 2004.  She has normal left ventricular systolic  function.   Other factors that might contribute her shortness of breath include  physical deconditioning, obesity, type 2 diabetes, which is up and down,  it sounds like, hypertension.  She has never been a smoker.   She denies any orthopnea, PND, or peripheral edema.  Her weight has been  relatively stable.  There has been no tachy palpitations, no syncope.   PAST MEDICAL HISTORY:  She has type 2 diabetes for about 5 years,  hypertension, hyperlipidemia.   PAST SURGICAL HISTORY:  Bilateral cataract surgery, partial gastrectomy,  bilateral tubal ligation.   ALLERGIES:  COMPAZINE.   CURRENT MEDICATIONS:  1. Lisinopril 10 mg a day.  2. Metformin 1000 mg b.i.d.  3. Lopressor 50 mg.  4. Aspirin 81 mg a day.  5. Centrum Silver.  6. Lyrica 100 mg a day.  7. Xanax 0.5 mg nightly.  8. Provera daily.  9. NovoLog 65 units b.i.d. and 75 units at night.   SOCIAL HISTORY:  She is married.  She has 3 children.  She does not use  any tobacco or alcohol.  She lives in [place].   FAMILY HISTORY:  Notable for coronary artery disease.  There is also a  history of diabetes.   REVIEW OF SYSTEMS:  Other than HPI all  review of systems are negative.  She specifically denies any hematemesis, hemoptysis, or melena.   PHYSICAL EXAMINATION:  GENERAL:  She is a very pleasant well-developed,  well-nourished overweight white female in no acute distress.  VITAL SIGNS:  Her blood pressure is 106/73.  We checked orthostatics and  she drops from 106 systolic to 98 systolic standing.  Her heart rate  stays in the low 60s and does not increase.  She stopped.  Today, weight  is 256.  HEENT:  Really unremarkable.  NECK:  Supple.  Carotid upstrokes were equal bilaterally without bruits.  Thyroid is not enlarged.  Trachea is midline.  There is no  lymphadenopathy.  CHEST:  Lungs are clear to auscultation and percussion with expiratory  rhonchi, but no true wheezing.  HEART:  Poorly-appreciated PMI, soft S1, S2.  No murmur, rub, or gallop.  ABDOMEN:  Obese with good bowel sounds.  There is no obvious  organomegaly or midline bruit or pulsatile mass.  EXTREMITIES:  No  significant edema.  Pulses are intact.  NEURO:  Intact.  There is no sign of DVT or cords.  SKIN:  Unremarkable.   EKG is normal except for mild left axis deviation.   ASSESSMENT:  1. Shortness of breath which is probably multifactorial as mentioned      above.  Contributing factors include deconditioning, obesity, type      2 diabetes, hypertension.  We need to rule out a component of sleep      apnea or aspiration.  2. Relative bradycardia, particularly in setting of standing with      orthostatic changes.   PLAN:  1. Decrease Lopressor by half.  I think she is taking 50 mg per day,      but she did not have her list.  We have cut it to 25 mg once a day      or 25 mg b.i.d. depending on what she is taking.  2. Pulmonary referral with sleep evaluation potentially.     Thomas C. Daleen Squibb, MD, [facility]  Electronically Signed    TCW/MedQ  DD: 01/13/2009  DT: 01/13/2009  Job #: [id number]   cc:   Patrica Duel, M.D.

## 2011-03-30 NOTE — Discharge Summary (Signed)
   NAME:  [name] [name], [name] [name]                          ACCOUNT NO.:  [id number]   MEDICAL RECORD NO.:  [id number]                   PATIENT TYPE:  INP   LOCATION:  4705                                 FACILITY:  [facility]   PHYSICIAN:  Cristy Hilts. Jacinto Halim, M.D.                  DATE OF BIRTH:  [birth year]   DATE OF ADMISSION:  12/30/2002  DATE OF DISCHARGE:  12/31/2002                                 DISCHARGE SUMMARY   DISCHARGE DIAGNOSES:  1. Chest pain, noncardiac in origin with essentially normal coronaries by     catheterization this admission.  2. Hypertension.  3. Non-insulin-dependent diabetes.  4. Dyslipidemia.   HOSPITAL COURSE:  The patient is a 70 year old female with a history of  hypertension and diabetes and hyperlipidemia who was referred to Dr.  Domingo Sep 12/30/02 for chest pain.  She had a remote catheterization in 1992  by Dr.Winikur which was reportedly normal.  She apparently developed some  increasing chest pain and increasing dyspnea over the last couple of months.  She was admitted from the office 12/30/02 for diagnostic catheterization.  She was put on IV heparin and nitrates.  The patient ruled out for an MI.  She was taken to the cath lab by Dr. Jacinto Halim 12/31/02.  Catheterization  revealed essentially normal coronaries.  Dr. Jacinto Halim adjusted her medications,  taking her off Lipitor and putting her on Lescol XL 80 and adding Accupril  10 mg b.i.d.  We also added Prevacid.  She is to continue her Glucotrol 10  mg twice a day and a baby aspirin.   LABS:  White count 7200, hemoglobin 13.4, hematocrit 38.3, platelets  195,000.  Sodium 143, potassium 3.8, BUN 12, creatinine 0.6.  INR 0.9.  Amylase and lipase were within normal limits.  CK-MBs and Troponins are  negative x3.  Lipid profile was pending at the time of this dictation.  Hemoglobin A1C was 7.8.  EKG reveals normal sinus rhythm with nonspecific ST  changes.   DISPOSITION:  The patient is discharged in stable condition and  will follow  up with Dr. Domingo Sep in [facility].     Abelino Derrick, P.A.                      Cristy Hilts. Jacinto Halim, M.D.    Lenard Lance  D:  12/31/2002  T:  12/31/2002  Job:  [id number]   cc:   Kem Boroughs, M.D.  [phone] [address]  [place]  [place] [address]  Fax: [phone]   Madelin Rear. Sherwood Gambler, M.D.  [address]  [place]  [place] [address]  Fax: [phone]

## 2011-03-30 NOTE — H&P (Signed)
NAMEKIT, BRUBACHER                ACCOUNT NO.:  [id number]   MEDICAL RECORD NO.:  [id number]           PATIENT TYPE:   LOCATION:                                 FACILITY:   PHYSICIAN:  Dani Gobble, MD       DATE OF BIRTH:  [birth year]   DATE OF ADMISSION:  02/24/2007  DATE OF DISCHARGE:                              HISTORY & PHYSICAL   REFERRING PHYSICIAN:  Dr. Phillips Odor and Dr. Domingo Sep.   Ms. Hoffart is a very pleasant 69 year old female with a past medical  history of diabetes mellitus, hypertension, and hyperlipidemia whom we  evaluated in 2004 for chest discomfort and shortness of breath. At that  time, she had a cardiac catheterization at [facility] which  revealed sequential 20% followed 40% stenoses in the LAD, and otherwise  her coronaries were without significant disease.  She had an ejection  fraction estimated at 50% with mild global hypokinesis.  She has had  prior renal Dopplers which suggested 60% or greater stenosis in the  proximal right renal artery.   Dr. Phillips Odor called and asked me to see Ms. Starlin urgently in the office  today with which I was happy to comply.  She reports a one-month history  of chest discomfort which she locates in the mid substernal region  associated with shortness of breath.  There is no radiation of the  discomfort.  There is no nausea, vomiting, or diaphoresis.  Duration is  approximately 5-10 minutes.  It is clearly exertional in nature and is  increased in frequency to the point where it occurs daily now.   Along the same time frame, she now has a one-month history of worsening  shortness of breath and increasing dyspnea on exertion.  She admits to  palpitations which occur 3-4 times a day and are brief in duration and  which are essentially asymptomatic.  She describes it as her heart  beating fast.  She denies lower extremity edema, PND, and orthopnea.  She does not exercise on a regular basis.  Her cardiac risk factors are  numerous including diabetes, hypertension, hyperlipidemia, and a strong  family history of coronary artery disease.  She is a nonsmoker.   PAST MEDICAL HISTORY:  1. Diabetes mellitus.  2. Hypertension.  3. Hyperlipidemia.  4. Nonobstructive CAD at catheterization in 2004.  5. EF of approximately 50% by catheterization in 2004.   PAST SURGICAL HISTORY:  1. Bilateral cataract surgery.  2. Partial gastrectomy.  3. Bilateral tubal ligation.   ALLERGIES:  COMPAZINE.   CURRENT MEDICATIONS:  1. Aspirin 81 daily.  2. Centrum Silver daily.  3. Lopressor 25 b.i.d.  4. NovoLog sliding scale insulin.  5. Lantus 55 units b.i.d.  6. Zocor 20 daily.  7. Xanax 0.5 b.i.d.   SOCIAL HISTORY:  She is married with 3 children.  She denies tobacco,  alcohol, or illicit drug use.   FAMILY HISTORY:  Notable for CAD and cancer, type unspecified, as well  as diabetes mellitus.   REVIEW OF SYSTEMS:  GENERAL: She has had significant weight gain in the  last 4 years of approximately 40 pounds.  MUSCULOSKELETAL: Negative for  arthritis.  SKIN:  Negative.  EYES:  Negative.  She does have her eyes  checked yearly given her diabetic status.  RESPIRATORY AND  CARDIOVASCULAR:  As outlined above.  GI:  Notable for indigestion but no  blood in her stool and no diarrhea or constipation.  GU:  Negative.  ENDOCRINE:  Notable for diabetes.  NEUROLOGIC:  She does admit to nurse  some dizziness and lightheadedness without frank syncope.  ENT:  Negative.  PSYCH:  She uses Xanax for anxiety disorder.  VASCULAR:  Negative, although under musculoskeletal she did mention that she has  some leg pains at night when she lies down.   PHYSICAL EXAMINATION:  GENERAL:  A pleasant, morbidly obese white female  in no acute distress.  Alert and oriented x3.  VITAL SIGNS:  Height 5 feet 7-1/2 inches tall, weight 256 pounds, up 40  pounds from last visit.  Blood pressure 130/80 with heart rate of 68.  NECK:  Negative for JVD at  90 degrees.  No bruits are noted.  No  lymphadenopathy, no thyromegaly.  LUNGS:  Clear.  CARDIAC:  Regular rate and rhythm without definitive murmurs, gallops,  or rubs appreciated.  PMI does not appear to be displaced.  No heaves or  lifts.  Carotid upstrokes normal, 2+ bilaterally without delay.  ABDOMEN:  Obese, soft, nontender, nondistended with positive bowel  sounds in all four quadrants.  She has a small bruise at the site of her  insular injections in the mid abdomen.  EXTREMITIES:  Lower extremities are negative for edema.  Distal pulses  are intact and equal bilaterally.   An EKG performed in the office today reveals normal sinus rhythm at 68  beats per minute with nonspecific ST changes.  This does not appear to  be a significant change from her prior EKG.   There is no recent lab work for review.   IMPRESSION:  1. Chest discomfort and shortness of breath worrisome for unstable      angina.  2. Nonobstructive coronary artery disease at catheterization in 2004.  3. Last ejection fraction by catheterization 50%.  4. Renal Dopplers in 2004 suggestive of possible right renal artery      stenosis.  5. Diabetes mellitus.  6. Hypertension.  7. Hyperlipidemia.  8. Not currently on Accupril as she self discontinued this due to a      low blood pressure.  9. Exogenous obesity.  10.Body habitus worrisome for problems with obstructive sleep apnea.   RECOMMENDATIONS:  1. Admit to [facility] on rule out MI protocol with serial      cardiac enzymes, chest x-ray, and routine lab work.  2. Plan for IV heparin, IV nitroglycerin, beta blocker, statin,      aspirin, and hold ACE inhibitor until after      contrast challenge given her diabetic kidneys.  3. Plan for catheterization tomorrow morning or sooner if she becomes      unstable.  4. She will need a sleep study as an outpatient at a later date.          ______________________________  Dani Gobble, MD      AB/MEDQ  D:  02/24/2007  T:  02/24/2007  Job:  [id number]

## 2011-03-30 NOTE — Cardiovascular Report (Signed)
NAME:  [name] [name], [name] [name]                          ACCOUNT NO.:  [id number]   MEDICAL RECORD NO.:  [id number]                   PATIENT TYPE:  INP   LOCATION:  4705                                 FACILITY:  [facility]   PHYSICIAN:  Cristy Hilts. Jacinto Halim, M.D.                  DATE OF BIRTH:  28-Jan-1941   DATE OF PROCEDURE:  12/31/2002  DATE OF DISCHARGE:                              CARDIAC CATHETERIZATION   PROCEDURE PERFORMED:  1. Left ventriculography.  2. Selective right and left coronary arteriography.   INDICATIONS FOR PROCEDURE:  The patient is a 70 year old female with a  history of hypertension, diabetes, hypercholesterolemia who was admitted to  the hospital with ongoing chest pain.  Although she had atypical chest pain,  given her multiple cardiac risk factors, she was brought to the cardiac  catheterization lab to evaluate her coronary anatomy.   HEMODYNAMIC DATA:  1. The left ventricular pressures were 136/12 with an end diastolic pressure     of 23 mmHg.  2. The aortic pressures were 136/82 with a mean of 106 mmHg.  3. There was no pressure gradient across the aortic valve.   ANGIOGRAPHIC DATA:  1. Left ventricle:  The left ventricular systolic function was in the lower     limit of normal, and the EF was estimated at 50% with mild global     hypokinesis.  There was no significant mitral regurgitation.  2. Right coronary artery:  The right coronary artery is a large caliber     vessel.  It was a dominant vessel.  It has a large PDA and PLV branches.  3. Left main coronary artery:  The left main coronary artery is a large     caliber vessel.  It is normal.  4. Circumflex coronary artery:  The circumflex coronary artery is a very     large caliber vessel.  It has an unusual anatomy in which the circumflex     continues as a large obtuse marginal and gives several secondary marginal     branches, and supplies the large part of the lateral wall and part of the     posterior wall.   It appears to be codominant with the right coronary     artery.  It is normal.  5. Left anterior descending artery:  The left anterior descending artery is     a large caliber vessel.  In its proximal segment, it has mild 20% luminal     irregularity followed by mild ectasia at the trifurcation region.  The     region was a trifurcation into diagonal, septal perforator, and mid LAD.     There is negative remottling noted at this trifurcation point and has, at     most, 40%  stenosis.  The LAD itself ends just at the apex.   IMPRESSION:  Mild coronary artery disease in the mid left  anterior  descending artery at the trifurcation point of diagonal 1 and septal  perforator.  This region has mild ectasia.  There is about 40% luminal  narrowing.  This appears to be slightly progressed compared to the December  1992 cardiac catheterization; however, this does not appear to be clinically  significant.   RECOMMENDATIONS:  Aggressive risk factor modification is indicated.  If in  spite of aggressive risk factor modification, she continues to have  recurrent chest pain, consideration can be given for Cardiolite stress test  to evaluate the LAD stenosis significance.   TECHNIQUE OF PROCEDURE:  Under usual sterile precautions, using 6-French  right femoral arterial access, a 6-French multipurpose B2 catheter was  advanced into the ascending aorta over a 0.035-inch J wire.  The catheter  was gently advanced to the left ventricle, and left ventricular pressures  were monitored.  Hand contrast injection of the left ventricle was  performed, both in LAO and RAO projection.  The catheter was flushed with  saline, pulled back into the ascending aorta, and pressure gradient across  the aortic valve was monitored.  The right coronary artery was selectively  engaged, and angiography was performed.  In a similar fashion, the left main  coronary artery was selectively engaged, and angiography was performed.   Then the catheter was pulled out of the body in the usual fashion.  The  patient tolerated the procedure well.                                               Cristy Hilts. Jacinto Halim, M.D.    Pilar Plate  D:  12/31/2002  T:  12/31/2002  Job:  [id number]   cc:   [facility]   [facility]   Kem Boroughs, M.D.  [phone] [address]  [place]  [place] [address]  Fax: [phone]

## 2011-03-30 NOTE — Cardiovascular Report (Signed)
[name] [name], [name] [name]                ACCOUNT NO.:  [id number]   MEDICAL RECORD NO.:  [id number]          PATIENT TYPE:  INP   LOCATION:  3732                         FACILITY:  [facility]   PHYSICIAN:  Thereasa Solo. Little, M.D. DATE OF BIRTH:  [birth year]   DATE OF PROCEDURE:  02/25/2007  DATE OF DISCHARGE:                            CARDIAC CATHETERIZATION   INDICATIONS FOR TEST:  This 70 year old female presents with a  relatively new and sudden onset of dyspnea on exertion and in the past  had had mild chest pain.  Because of this. she was admitted to [facility].  [facility] and comes in for a cardiac catheterization today.   After obtaining informed consent, the patient was prepped and draped in  the usual sterile fashion exposing the right groin.  Following local  anesthesia with 1% Xylocaine, a Smart needle was used and the right  femoral artery was cannulated.  A 5-French introducer sheath was placed  and left and right coronary arteriography and ventriculography in the  RAO projection was performed.   COMPLICATIONS:  None.   We used 5-French Judkins configuration catheters.   TOTAL CONTRAST USED:  80 mL.   RESULTS:  1. Hemodynamic monitoring:  Central aortic pressure 120/64.  Left      ventricular pressure 124/4 and there was no significant gradient      noted at the time of pullback.  2. Ventriculography.  Ventriculography in the RAO projection revealed      normal LV systolic function, EF in excess of 60%. There was no      mitral regurgitation.  End-diastolic pressure was 13.  3. Coronary arteriography.      a.     Left main normal.  It bifurcated.      b.     LAD.  The LAD had mild irregularities in the proximal       portion.  The first diagonal was free of disease.  The distal       vessel reached the apex of the heart.      c.     Circumflex.  The circumflex gave rise to a large OM vessel       that was about 4 mm in diameter.  It bifurcated.  It was free of    disease.      d.     Right coronary artery.  This was a dominant vessel with a       PDA and two posterior lateral vessels, all of which were free of       disease.   CONCLUSION:  1. Normal left ventricular systolic function.  2. Mild irregularities in the left anterior descending with no high-      grade coronary disease noted.   I have stopped her IV heparin and nitroglycerin.  She will need  outpatient pulmonary function studies and she may be able to be  discharged home later today.           ______________________________  Thereasa Solo. Little, M.D.     ABL/MEDQ  D:  02/25/2007  T:  02/25/2007  Job:  [id number]   cc:   [facility]  Dani Gobble, MD  Cath Lab

## 2011-03-30 NOTE — Discharge Summary (Signed)
[name] [name], [name] [name]                ACCOUNT NO.:  [id number]   MEDICAL RECORD NO.:  [id number]          PATIENT TYPE:  INP   LOCATION:  3732                         FACILITY:  [facility]   PHYSICIAN:  Charmian Muff, NP        DATE OF BIRTH:  18-Nov-1940   DATE OF ADMISSION:  02/24/2007  DATE OF DISCHARGE:  02/25/2007                               DISCHARGE SUMMARY   PRIMARY CARE PHYSICIAN:  Dr. Phillips Odor.   DISCHARGE DIAGNOSES:  1. Chest pain.  2. Dyspnea on exertion.  3. Palpitations.  4. Known obstructive coronary artery disease.  5. Diabetes mellitus.  6. Hypertension.  7. Dyslipidemia.  8. Normal left ventricular function.   PROCEDURES:  Left heart catheterization, coronary arteriogram were  performed on February 25, 2007, by Dr. Caprice Kluver.  The left main was  normal.  Circumflex revealed a large OM that bifurcates.  The LAD was  mildly irregular with the first diagonal okay.  The right coronary  artery consists of PDA and 2 PL, which were okay.  Left main LV revealed  a normal systolic ejection fraction of greater than 65%.  There was no  MR.   HISTORY OF PRESENT ILLNESS:  [name] is a 70 year old female with  history of diabetes, hypertension and dyslipidemia, and known  obstructive disease with sequential 40% stenosis in the LAD in 2004.  She was admitted with a 72-month history of chest pain and progressive  dyspnea on exertion, as well as palpitations.  Please see dictated  history and physical for further details.   HOSPITAL COURSE:  Ms. Kiesling was admitted as an observation patient.  She  was ruled out for myocardial infarction with serial cardiac enzymes  negative x3.  She obtained a chest x-ray and routine lab work.  She was  started on IV heparin, as well as nitroglycerin, beta blocker and statin  therapy, as well as aspirin.  She underwent cardiac catheterization on  the following morning on February 25, 2007, with the findings as listed  above.  There were no complications.   It was recommended that she have  outpatient PFTs; however, was deemed suitable for discharge later in the  day.  She received IV fluids for 4 hours and ambulated without any  difficulty, and she was deemed ready for discharge.  She is to follow up  with Dr. Domingo Sep on Mar 20, 2007, at 11:15 a.m.           ______________________________  Charmian Muff, NP     LS/MEDQ  D:  04/08/2007  T:  04/08/2007  Job:  [id number]   cc:   Edsel Petrin, D.O.

## 2011-05-02 ENCOUNTER — Other Ambulatory Visit (HOSPITAL_COMMUNITY): Payer: Self-pay | Admitting: Family Medicine

## 2011-05-02 ENCOUNTER — Ambulatory Visit (HOSPITAL_COMMUNITY)
Admission: RE | Admit: 2011-05-02 | Discharge: 2011-05-02 | Disposition: A | Discharge status: Home / Self Care | Payer: Medicare Other | Source: Ambulatory Visit | Attending: Family Medicine | Admitting: Family Medicine

## 2011-05-02 DIAGNOSIS — M79609 Pain in unspecified limb: Secondary | ICD-10-CM | POA: Insufficient documentation

## 2011-05-02 DIAGNOSIS — M7989 Other specified soft tissue disorders: Secondary | ICD-10-CM

## 2011-05-03 ENCOUNTER — Ambulatory Visit (HOSPITAL_COMMUNITY): Payer: Medicare Other

## 2011-05-08 ENCOUNTER — Encounter: Payer: Self-pay | Admitting: Internal Medicine

## 2011-05-23 ENCOUNTER — Other Ambulatory Visit: Payer: Self-pay | Admitting: Internal Medicine

## 2011-05-23 LAB — AFP TUMOR MARKER: AFP-Tumor Marker: 2.2 ng/mL (ref 0.0–8.0)

## 2011-05-29 NOTE — Progress Notes (Signed)
Reminder opv is in the computer

## 2011-05-30 ENCOUNTER — Encounter (HOSPITAL_COMMUNITY): Payer: Self-pay | Admitting: *Deleted

## 2011-05-30 ENCOUNTER — Inpatient Hospital Stay (HOSPITAL_COMMUNITY)
Admission: EM | Admit: 2011-05-30 | Discharge: 2011-06-01 | DRG: 441 | Disposition: A | Discharge status: Home Health | Payer: Medicare Other | Attending: Internal Medicine | Admitting: Internal Medicine

## 2011-05-30 DIAGNOSIS — T4275XA Adverse effect of unspecified antiepileptic and sedative-hypnotic drugs, initial encounter: Secondary | ICD-10-CM | POA: Diagnosis present

## 2011-05-30 DIAGNOSIS — E782 Mixed hyperlipidemia: Secondary | ICD-10-CM | POA: Diagnosis present

## 2011-05-30 DIAGNOSIS — IMO0001 Reserved for inherently not codable concepts without codable children: Secondary | ICD-10-CM | POA: Diagnosis present

## 2011-05-30 DIAGNOSIS — T424X5A Adverse effect of benzodiazepines, initial encounter: Secondary | ICD-10-CM | POA: Diagnosis present

## 2011-05-30 DIAGNOSIS — K7290 Hepatic failure, unspecified without coma: Principal | ICD-10-CM | POA: Diagnosis present

## 2011-05-30 DIAGNOSIS — IMO0002 Reserved for concepts with insufficient information to code with codable children: Secondary | ICD-10-CM | POA: Diagnosis present

## 2011-05-30 DIAGNOSIS — E785 Hyperlipidemia, unspecified: Secondary | ICD-10-CM | POA: Diagnosis present

## 2011-05-30 DIAGNOSIS — Z794 Long term (current) use of insulin: Secondary | ICD-10-CM

## 2011-05-30 DIAGNOSIS — I2510 Atherosclerotic heart disease of native coronary artery without angina pectoris: Secondary | ICD-10-CM | POA: Diagnosis present

## 2011-05-30 DIAGNOSIS — R739 Hyperglycemia, unspecified: Secondary | ICD-10-CM

## 2011-05-30 DIAGNOSIS — G92 Toxic encephalopathy: Secondary | ICD-10-CM | POA: Diagnosis present

## 2011-05-30 DIAGNOSIS — G929 Unspecified toxic encephalopathy: Secondary | ICD-10-CM | POA: Diagnosis present

## 2011-05-30 DIAGNOSIS — I10 Essential (primary) hypertension: Secondary | ICD-10-CM | POA: Diagnosis not present

## 2011-05-30 DIAGNOSIS — E1142 Type 2 diabetes mellitus with diabetic polyneuropathy: Secondary | ICD-10-CM | POA: Diagnosis present

## 2011-05-30 DIAGNOSIS — K7682 Hepatic encephalopathy: Principal | ICD-10-CM | POA: Diagnosis present

## 2011-05-30 DIAGNOSIS — K7460 Unspecified cirrhosis of liver: Secondary | ICD-10-CM | POA: Diagnosis present

## 2011-05-30 HISTORY — DX: Unspecified cirrhosis of liver: K74.60

## 2011-05-30 LAB — COMPREHENSIVE METABOLIC PANEL
ALT: 20 U/L (ref 0–35)
AST: 31 U/L (ref 0–37)
Alkaline Phosphatase: 76 U/L (ref 39–117)
CO2: 25 mEq/L (ref 19–32)
Calcium: 9.3 mg/dL (ref 8.4–10.5)
GFR calc Af Amer: >60 mL/min (ref >60)
GFR calc non Af Amer: >60 mL/min (ref >60)
Glucose, Bld: 351 mg/dL — ABNORMAL HIGH (ref 70–99)
Potassium: 4.2 mEq/L (ref 3.5–5.1)
Sodium: 136 mEq/L (ref 135–145)
Total Protein: 6.7 g/dL (ref 6.0–8.3)

## 2011-05-30 LAB — PROTIME-INR
INR: 1.1 (ref 0.00–1.49)
Prothrombin Time: 14.4 seconds (ref 11.6–15.2)

## 2011-05-30 LAB — DIFFERENTIAL
Basophils Absolute: 0.1 10*3/uL (ref 0.0–0.1)
Eosinophils Relative: 3 % (ref 0–5)
Lymphocytes Relative: 32 % (ref 12–46)
Lymphs Abs: 1.6 10*3/uL (ref 0.7–4.0)
Neutrophils Relative %: 55 % (ref 43–77)

## 2011-05-30 LAB — CBC
Platelets: 140 10*3/uL — ABNORMAL LOW (ref 150–400)
RBC: 4.93 MIL/uL (ref 3.87–5.11)
RDW: 13.9 % (ref 11.5–15.5)
WBC: 4.9 10*3/uL (ref 4.0–10.5)

## 2011-05-30 LAB — GLUCOSE, CAPILLARY

## 2011-05-30 MED ORDER — BUDESONIDE-FORMOTEROL FUMARATE 80-4.5 MCG/ACT IN AERO
2.0000 | INHALATION_SPRAY | Freq: Two times a day (BID) | RESPIRATORY_TRACT | Status: DC
  Administered 2011-05-30 – 2011-06-01 (×4): 2 via RESPIRATORY_TRACT
  Filled 2011-05-30: qty 6.9

## 2011-05-30 MED ORDER — SODIUM CHLORIDE 0.9 % IJ SOLN
3.0000 mL | INTRAMUSCULAR | Status: DC | PRN
  Filled 2011-05-30: qty 3

## 2011-05-30 MED ORDER — ALPRAZOLAM 0.5 MG PO TABS
0.5000 mg | ORAL_TABLET | Freq: Three times a day (TID) | ORAL | Status: DC | PRN
  Administered 2011-05-30 – 2011-05-31 (×2): 0.5 mg via ORAL
  Filled 2011-05-30 (×2): qty 1

## 2011-05-30 MED ORDER — METOPROLOL TARTRATE 50 MG PO TABS
50.0000 mg | ORAL_TABLET | Freq: Every evening | ORAL | Status: DC
Start: 2011-05-30 — End: 2011-06-01
  Administered 2011-05-31: 50 mg via ORAL
  Filled 2011-05-30: qty 1

## 2011-05-30 MED ORDER — LACTULOSE 10 GM/15ML PO SOLN
20.0000 g | Freq: Two times a day (BID) | ORAL | Status: DC
  Administered 2011-05-30 – 2011-05-31 (×3): 20 g via ORAL
  Filled 2011-05-30 (×4): qty 30

## 2011-05-30 MED ORDER — LACTULOSE ENCEPHALOPATHY 10 GM/15ML PO SOLN
20.0000 g | Freq: Two times a day (BID) | ORAL | Status: DC
  Filled 2011-05-30 (×4): qty 30

## 2011-05-30 MED ORDER — ONDANSETRON HCL 4 MG PO TABS: 4.0000 mg | ORAL_TABLET | Freq: Four times a day (QID) | ORAL | Status: DC | PRN

## 2011-05-30 MED ORDER — INSULIN ASPART 100 UNIT/ML ~~LOC~~ SOLN: 0.0000 [IU] | SUBCUTANEOUS | Status: DC

## 2011-05-30 MED ORDER — LACTULOSE 10 GM/15ML PO SOLN
30.0000 g | Freq: Every day | ORAL | Status: DC | PRN
  Administered 2011-05-30: 20 g via ORAL
  Administered 2011-05-30: 10 g via ORAL
  Filled 2011-05-30: qty 45

## 2011-05-30 MED ORDER — ASPIRIN EC 81 MG PO TBEC
81.0000 mg | DELAYED_RELEASE_TABLET | Freq: Every day | ORAL | Status: DC
  Administered 2011-05-30 – 2011-06-01 (×3): 81 mg via ORAL
  Filled 2011-05-30 (×3): qty 1

## 2011-05-30 MED ORDER — PANTOPRAZOLE SODIUM 40 MG PO PACK
80.0000 mg | PACK | Freq: Every day | ORAL | Status: DC
  Filled 2011-05-30 (×3): qty 40

## 2011-05-30 MED ORDER — INSULIN ASPART PROT & ASPART (70-30 MIX) 100 UNIT/ML ~~LOC~~ SUSP
60.0000 [IU] | Freq: Two times a day (BID) | SUBCUTANEOUS | Status: DC
  Administered 2011-05-31 – 2011-06-01 (×3): 60 [IU] via SUBCUTANEOUS
  Filled 2011-05-30: qty 3

## 2011-05-30 MED ORDER — ONDANSETRON HCL 4 MG/2ML IJ SOLN: 4.0000 mg | Freq: Four times a day (QID) | INTRAMUSCULAR | Status: DC | PRN

## 2011-05-30 MED ORDER — MECLIZINE HCL 12.5 MG PO TABS: 25.0000 mg | ORAL_TABLET | Freq: Three times a day (TID) | ORAL | Status: DC | PRN

## 2011-05-30 MED ORDER — SODIUM CHLORIDE 0.9 % IV SOLN
Freq: Once | INTRAVENOUS | Status: AC
  Administered 2011-05-30: 19:00:00 via INTRAVENOUS

## 2011-05-30 MED ORDER — INSULIN NPH ISOPHANE & REGULAR (70-30) 100 UNIT/ML ~~LOC~~ SUSP
60.0000 [IU] | Freq: Two times a day (BID) | SUBCUTANEOUS | Status: DC
  Filled 2011-05-30: qty 10

## 2011-05-30 MED ORDER — INSULIN ASPART 100 UNIT/ML ~~LOC~~ SOLN
0.0000 [IU] | Freq: Every day | SUBCUTANEOUS | Status: DC
  Administered 2011-05-30: 2 [IU] via SUBCUTANEOUS
  Filled 2011-05-30: qty 3

## 2011-05-30 MED ORDER — SODIUM CHLORIDE 0.9 % IJ SOLN
3.0000 mL | Freq: Two times a day (BID) | INTRAMUSCULAR | Status: DC
  Administered 2011-05-30 – 2011-05-31 (×3): 3 mL via INTRAVENOUS
  Filled 2011-05-30 (×2): qty 3

## 2011-05-30 MED ORDER — INSULIN ASPART 100 UNIT/ML ~~LOC~~ SOLN
0.0000 [IU] | Freq: Three times a day (TID) | SUBCUTANEOUS | Status: DC
  Administered 2011-05-31: 5 [IU] via SUBCUTANEOUS
  Administered 2011-05-31: 8 [IU] via SUBCUTANEOUS

## 2011-05-30 MED ORDER — THERA M PLUS PO TABS
1.0000 | ORAL_TABLET | Freq: Every day | ORAL | Status: DC
  Administered 2011-05-31 – 2011-06-01 (×2): 1 via ORAL
  Filled 2011-05-30 (×3): qty 1

## 2011-05-30 NOTE — ED Notes (Signed)
Patient given a sandwich, chips and drink; ok per T Triplett, PA.  Family at bedside.  Patient awaiting admission.

## 2011-05-30 NOTE — ED Provider Notes (Signed)
History     Chief Complaint  Patient presents with  . Altered Mental Status   HPI Comments: Patient with a hx of cirrhosis of the liver c/o intermittent episodes of confusion.  States she was seen by her PMD on Friday and told her ammonia level was slightly elevated and to come to ER if the confusion became worse.  She denies chest pain, focal weakness, fever, vomiting or numbness.  She also c/o pain to both lower legs for several weeks.  States her legs have been hurting since she was taken off her Lyrica.  States she has been taking it intermittently since being told to discontinue it.  She denies redness, calf swelling or numbness to her lower extremities.    Patient is a 70 y.o. female presenting with altered mental status. The history is provided by the patient and the spouse.  Altered Mental Status This is a recurrent problem. The current episode started in the past 7 days. The problem occurs intermittently. The problem has been gradually improving. Associated symptoms include abdominal pain and weakness. Pertinent negatives include no anorexia, chest pain, chills, fever, headaches, myalgias, nausea, neck pain, numbness, sore throat or vomiting. The symptoms are aggravated by nothing. Treatments tried: her own medication. The treatment provided no relief.  Altered Mental Status This is a recurrent problem. The current episode started in the past 7 days. The problem occurs intermittently. The problem has been gradually improving. Associated symptoms include abdominal pain. Pertinent negatives include no chest pain and no headaches. The symptoms are aggravated by nothing. Treatments tried: her own medication. The treatment provided no relief.    Past Medical History  Diagnosis Date  . CAD in native artery   . Thrombocytopenia, unspecified   . Unspecified hereditary and idiopathic peripheral neuropathy   . Unspecified fall   . Other and unspecified hyperlipidemia   . Unspecified essential  hypertension   . Obesity, unspecified   . Shortness of breath   . Personal history of neurosis   . Type II or unspecified type diabetes mellitus without mention of complication, not stated as uncontrolled   . Intrinsic asthma, unspecified   . Allergic rhinitis, cause unspecified   . Cirrhosis of liver     Past Surgical History  Procedure Date  . Hysterectomy and btl     s/p  . Abdominal hysterectomy   . Tumor excision     rt arm and left foot    Family History  Problem Relation Age of Onset  . Coronary artery disease      FH  . Diabetes      FH  . Heart disease Mother     History  Substance Use Topics  . Smoking status: Never Smoker   . Smokeless tobacco: Not on file  . Alcohol Use: No    OB History    Grav Para Term Preterm Abortions TAB SAB Ect Mult Living                  Review of Systems  Constitutional: Negative for fever and chills.  HENT: Negative for ear pain, sore throat and neck pain.   Respiratory: Negative.   Cardiovascular: Negative.  Negative for chest pain.  Gastrointestinal: Positive for abdominal pain. Negative for nausea, vomiting, constipation, abdominal distention, rectal pain and anorexia.  Genitourinary: Negative for dysuria, flank pain and pelvic pain.  Musculoskeletal: Negative for myalgias and back pain.  Skin: Negative.   Neurological: Positive for weakness. Negative for dizziness, numbness and  headaches.  Hematological: Does not bruise/bleed easily.  Psychiatric/Behavioral: Positive for confusion and altered mental status. Negative for hallucinations. The patient is not nervous/anxious.     Physical Exam  BP 130/67  Pulse 75  Temp(Src) 98.6 F (37 C) (Oral)  Resp 20  Ht 5\' 8"  (1.727 m)  Wt 275 lb (124.739 kg)  BMI 41.81 kg/m2  SpO2 98%  Physical Exam  Constitutional: She is oriented to person, place, and time. She appears well-developed and well-nourished.  Non-toxic appearance.  HENT:  Head: Normocephalic and  atraumatic.  Eyes: EOM are normal. Pupils are equal, round, and reactive to light.  Neck: Normal range of motion. No JVD present. No thyromegaly present.  Cardiovascular: Normal rate, regular rhythm and normal heart sounds.   Pulmonary/Chest: Effort normal and breath sounds normal.  Abdominal: Soft. Bowel sounds are normal. She exhibits no distension. There is tenderness. There is no rebound and no guarding.  Musculoskeletal: She exhibits edema. She exhibits no tenderness.  Lymphadenopathy:    She has no cervical adenopathy.  Neurological: She is alert and oriented to person, place, and time. No cranial nerve deficit. She exhibits normal muscle tone.  Skin: Skin is warm and dry.  Psychiatric: She has a normal mood and affect. Judgment and thought content normal.    ED Course  Procedures  MDM     1910  Patient with hx of cirrhosis and intermittent episodes of confusion and elevated ammonia level today.  Lactulose given in ED.  Patient care plan was discussed with EDP.  Will consult for admission.    Results for orders placed during the hospital encounter of 05/30/11  CBC      Component Value Range   WBC 4.9  4.0 - 10.5 (K/uL)   RBC 4.93  3.87 - 5.11 (MIL/uL)   Hemoglobin 14.8  12.0 - 15.0 (g/dL)   HCT 16.1  09.6 - 04.5 (%)   MCV 86.4  78.0 - 100.0 (fL)   MCH 30.0  26.0 - 34.0 (pg)   MCHC 34.7  30.0 - 36.0 (g/dL)   RDW 40.9  81.1 - 91.4 (%)   Platelets 140 (*) 150 - 400 (K/uL)  DIFFERENTIAL      Component Value Range   Neutrophils Relative 55  43 - 77 (%)   Neutro Abs 2.7  1.7 - 7.7 (K/uL)   Lymphocytes Relative 32  12 - 46 (%)   Lymphs Abs 1.6  0.7 - 4.0 (K/uL)   Monocytes Relative 9  3 - 12 (%)   Monocytes Absolute 0.4  0.1 - 1.0 (K/uL)   Eosinophils Relative 3  0 - 5 (%)   Eosinophils Absolute 0.2  0.0 - 0.7 (K/uL)   Basophils Relative 1  0 - 1 (%)   Basophils Absolute 0.1  0.0 - 0.1 (K/uL)  COMPREHENSIVE METABOLIC PANEL      Component Value Range   Sodium 136  135  - 145 (mEq/L)   Potassium 4.2  3.5 - 5.1 (mEq/L)   Chloride 103  96 - 112 (mEq/L)   CO2 25  19 - 32 (mEq/L)   Glucose, Bld 351 (*) 70 - 99 (mg/dL)   BUN 14  6 - 23 (mg/dL)   Creatinine, Ser 7.82  0.50 - 1.10 (mg/dL)   Calcium 9.3  8.4 - 95.6 (mg/dL)   Total Protein 6.7  6.0 - 8.3 (g/dL)   Albumin 3.3 (*) 3.5 - 5.2 (g/dL)   AST 31  0 - 37 (U/L)   ALT  20  0 - 35 (U/L)   Alkaline Phosphatase 76  39 - 117 (U/L)   Total Bilirubin 2.0 (*) 0.3 - 1.2 (mg/dL)   GFR calc non Af Amer >60  >60 (mL/min)   GFR calc Af Amer >60  >60 (mL/min)  AMMONIA - [place] ONLY      Component Value Range   Ammonia 111 (*) 11 - 60 (umol/L)  APTT      Component Value Range   aPTT 39 (*) 24 - 37 (seconds)  PROTIME-INR      Component Value Range   Prothrombin Time 14.4  11.6 - 15.2 (seconds)   INR 1.10  0.00 - 1.49       Medical screening examination/treatment/procedure(s) were conducted as a shared visit with non-physician practitioner(s) and myself.  I personally evaluated the patient during the encounter   Tammy L. Bushong, [place] 05/30/11 1943  Sunnie Nielsen, MD 06/02/11 1944

## 2011-05-30 NOTE — ED Notes (Signed)
Pt c/o being dizzy and confused. Pt also c/o pain in her legs. Pt was told by Dr. Phillips Odor on Friday that her ammonia level was 90 and she needed to come to the ED if she got worse. Pt's family states that her confusion is getting worse. Pt alert and oriented x 3 at this time.

## 2011-05-30 NOTE — H&P (Signed)
  Job [id number]

## 2011-05-30 NOTE — ED Notes (Signed)
Pt medicated, med from pharmacy. Pt sitting up in bed talking with family. NAD.

## 2011-05-31 DIAGNOSIS — IMO0002 Reserved for concepts with insufficient information to code with codable children: Secondary | ICD-10-CM | POA: Diagnosis present

## 2011-05-31 DIAGNOSIS — K7290 Hepatic failure, unspecified without coma: Principal | ICD-10-CM | POA: Diagnosis present

## 2011-05-31 DIAGNOSIS — E1142 Type 2 diabetes mellitus with diabetic polyneuropathy: Secondary | ICD-10-CM | POA: Diagnosis present

## 2011-05-31 DIAGNOSIS — I10 Essential (primary) hypertension: Secondary | ICD-10-CM | POA: Diagnosis not present

## 2011-05-31 DIAGNOSIS — K7460 Unspecified cirrhosis of liver: Secondary | ICD-10-CM | POA: Diagnosis present

## 2011-05-31 DIAGNOSIS — E782 Mixed hyperlipidemia: Secondary | ICD-10-CM | POA: Diagnosis present

## 2011-05-31 LAB — COMPREHENSIVE METABOLIC PANEL
ALT: 18 U/L (ref 0–35)
Alkaline Phosphatase: 69 U/L (ref 39–117)
CO2: 26 mEq/L (ref 19–32)
Chloride: 105 mEq/L (ref 96–112)
Glucose, Bld: 224 mg/dL — ABNORMAL HIGH (ref 70–99)
Potassium: 3.6 mEq/L (ref 3.5–5.1)
Sodium: 139 mEq/L (ref 135–145)
Total Bilirubin: 2.4 mg/dL — ABNORMAL HIGH (ref 0.3–1.2)

## 2011-05-31 LAB — GLUCOSE, CAPILLARY: Glucose-Capillary: 232 mg/dL — ABNORMAL HIGH (ref 70–99)

## 2011-05-31 MED ORDER — PANTOPRAZOLE SODIUM 40 MG PO TBEC
40.0000 mg | DELAYED_RELEASE_TABLET | Freq: Every day | ORAL | Status: DC
  Administered 2011-06-01: 40 mg via ORAL
  Filled 2011-05-31: qty 1

## 2011-05-31 NOTE — Progress Notes (Signed)
Subjective: 70 year old female with past medical history of cirrhosis of unclear etiology. No past medical history of alcohol consumption with negative ANA negative & acute hepatic panel. She is alert and oriented x3, confusion has resolved. Has an appetite. No complaints. Objective: Vital signs in last 24 hours: Filed Vitals:   05/30/11 2139 05/30/11 2325 05/31/11 0700 05/31/11 0832  BP:   100/50   Pulse:   68   Temp:   98.2 F (36.8 C)   TempSrc:      Resp:   20   Height:      Weight:   123.8 kg (272 lb 14.9 oz)   SpO2: 97% 97% 97% 95%   Weight change:   General appearance: alert and cooperative Resp: clear to auscultation bilaterally Cardio: regular rate and rhythm, S1, S2 normal, no murmur, click, rub or gallop Pulses: 2+ and symmetric Neurologic: Grossly normal  Lab Results:  Basename 05/31/11 0526 05/30/11 1422  NA 139 136  K 3.6 4.2  CL 105 103  CO2 26 25  GLUCOSE 224* 351*  BUN 13 14  CREATININE <0.47* 0.51  CALCIUM 9.1 9.3    Basename 05/30/11 1422  WBC 4.9  HGB 14.8  HCT 42.6  PLT 140*    Studies/Results: US Venous Img Lower Unilateral Left  05/02/2011  *RADIOLOGY REPORT*  Clinical Data: Pain and swelling in the left leg.  LEFT LOWER EXTREMITY VENOUS DUPLEX ULTRASOUND  Technique:  Gray-scale sonography with graded compression, as well as color Doppler and duplex ultrasound, were performed to evaluate the deep venous system of the lower extremity from the level of the common femoral vein through the popliteal and proximal calf veins. Spectral Doppler was utilized to evaluate flow at rest and with distal augmentation maneuvers.  Comparison:  None.  Findings: Normal color Doppler flow, compressibility, phasicity, and augmentation is present within the left common femoral vein, deep femoral vein, superficial femoral vein, popliteal vein. Limited evaluation of the veins of the calf is unremarkable.  IMPRESSION: No evidence for deep venous thrombosis.  Original  Report Authenticated By: Jamesetta Orleans. MATTERN, M.D.    Medications: I have reviewed the patient's current medications.  Assessment/Plan: 1. Acute Hepatic encephalopathy, patient alert and oriented x3 his her confusion has resolved. We'll resume her diet. Continue lactulose at current dose. Patient remains afebrile with normal white blood cell count. 2. Cirrhosis of liver, as per above  3.Uncontrolled Dm 2, discontinue sliding scale insulin, continue CBGs a.c. and at bedtime. Continue her 70/30 insulin home dose. Check a hemoglobin A1c. 4. Essential hypertension, well controlled no change.   LOS: 1 day   FELIZ ORTIZ, Sincere Berlanga 05/31/2011, 1:50 PM

## 2011-05-31 NOTE — H&P (Signed)
NAMEWILLADEEN, [name] [name]                ACCOUNT NO.:  [id number]  MEDICAL RECORD NO.:  [id number]  LOCATION:  A314                          FACILITY:  [facility]  PHYSICIAN:  Tarry Kos, MD       DATE OF BIRTH:  [birth year]  DATE OF ADMISSION:  05/30/2011 DATE OF DISCHARGE:  LH                             HISTORY & PHYSICAL   CHIEF COMPLAINT:  Confusion.  HISTORY OF PRESENT ILLNESS:  [name] [name] is a 70 year old female who has a history of cirrhosis of unclear etiology, who has been having some confusion that has worsened over the last several days.  She is already on lactulose at home.  She is compliant with this.  She has approximately 3-4 bowel movements a day.  She has had an extensive workup in the past as to what is the cause of her cirrhosis.  She states she was placed in a study about 3 years ago for her high cholesterol and diabetes and it was thought that maybe her cirrhosis with from medications she received during that study.  She has been following a GI physician.  She does not have a history of significant alcohol use, and she says she has never had a liver biopsy.  Her mental status right now is clear, but her family says that she has been more confused that has been waxing and waning and she has been more somnolent than normal.  PAST MEDICAL HISTORY: 1. Insulin-dependent diabetes. 2. Hypertension. 3. Cirrhosis of unclear etiology. 4. Hyperlipidemia. 5. Several heart caths in the past that did not require any stenting     with an EF of 50%. 6. Status post bilateral cataract surgery. 7. Status post partial gastrectomy. 8. Status post BTL.  ALLERGIES:  COMPAZINE.  MEDICATIONS:  Please see list.  SOCIAL HISTORY:  She does not smoke, never has drank any alcohol.  No IV drug abuse.  PHYSICAL EXAMINATION:  VITAL SIGNS:  Temperature is 97.8, pulse 60, respirations 20, blood pressure 123/76, and 98% O2 sats on room air. GENERAL:  She is alert and oriented with x4 right now.   No apparent distress, cooperative and friendly. HEENT:  Extraocular movements intact.  Pupils equal and reactive to light.  Oropharynx clear.  Mucous membranes moist. NECK:  No JVD.  No carotid bruit. CARDIAC:  Regular rate and rhythm without murmurs or gallops. CHEST:  Clear to auscultation bilaterally.  No rhonchi or rales. ABDOMEN:  Soft, nontender, nondistended.  Positive bowel sounds.  No hepatosplenomegaly.  EXTREMITIES:  No clubbing, cyanosis, or edema. PSYCHIATRIC:  Normal affect.  No focal neurologic deficits.  Electrolytes are normal.  Glucose is 351.  Ammonia level is 111.  Total bili is 2.  Platelets 140.  INR is 1.1.  ASSESSMENT AND PLAN:  This is a 70 year old female with a hepatic encephalopathy. 1. 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  We will increase her     lactulose dose and repeat her ammonia level in the morning.  She     will need continued outpatient followup with GI in the future. 2. Insulin-dependent diabetes.  Continue her insulin.  She is on     70/30, and we will continue  sliding scale insulin also. 3. Nonobstructive coronary artery disease, stable. 4. Morbid obesity, stable. 5. The patient is full code.  Further recommendation depending on     hospital course.                                           ______________________________ Tarry Kos, MD     RD/MEDQ  D:  05/30/2011  T:  05/31/2011  Job:  [id number]

## 2011-05-31 NOTE — Progress Notes (Signed)
UR Chart Review Completed  

## 2011-06-01 DIAGNOSIS — K7290 Hepatic failure, unspecified without coma: Principal | ICD-10-CM | POA: Diagnosis present

## 2011-06-01 DIAGNOSIS — K7682 Hepatic encephalopathy: Principal | ICD-10-CM | POA: Diagnosis present

## 2011-06-01 LAB — GLUCOSE, CAPILLARY: Glucose-Capillary: 222 mg/dL — ABNORMAL HIGH (ref 70–99)

## 2011-06-01 MED ORDER — LACTULOSE 10 GM/15ML PO SOLN: 20.0000 g | Freq: Two times a day (BID) | ORAL | Status: AC

## 2011-06-01 NOTE — Progress Notes (Signed)
Pt discharged home today with home health per Dr. Robb Matar. Pt provided with home medication list and discharge instructions. Pt verbalized understanding. Pt made aware of F/U appointments already in place. Verbalized understanding. Pt's IV site D/C'd and WNL. Pt's VS stable at this time. Pt left floor via WC accompanied by Rene Paci, NT in stable condition. Dagoberto Ligas, RN

## 2011-06-01 NOTE — Discharge Summary (Signed)
Physician Discharge Summary  Patient ID: [name] [name] MRN: [id number] DOB/AGE: 1941-09-12 70 y.o.  Admit date: 05/30/2011 Discharge date: 06/01/2011  Primary Care Physician:  Acute medical hepatic encephalopathy secondary to cirrhosis and/or medications.   Discharge Diagnoses:    Patient Active Problem List  Diagnoses  . DIABETES, TYPE 2  . HYPERLIPIDEMIA  . OBESITY  . THROMBOCYTOPENIA, CHRONIC  . PERIPHERAL NEUROPATHY  . HYPERTENSION  . CAD, NATIVE VESSEL  . ALLERGIC RHINITIS  . INTRINSIC ASTHMA, UNSPECIFIED  . GERD  . CIRRHOSIS  . OTHER CHRONIC NONALCOHOLIC LIVER DISEASE  . DYSPNEA  . ANXIETY DISORDER, HX OF  . COLONIC POLYPS, HX OF  . Cirrhosis of liver  . Hyperlipidemia  . Essential hypertension  . Uncontrolled type 2 diabetes mellitus with complication    Current Discharge Medication List    START taking these medications   Details  lactulose (CHRONULAC) 10 GM/15ML solution Take 30 mLs (20 g total) by mouth 2 (two) times daily. Qty: 240 mL, Refills: 0      CONTINUE these medications which have NOT CHANGED   Details  ALPRAZolam (XANAX) 0.5 MG tablet Take 0.5 mg by mouth every 8 (eight) hours as needed.     aspirin (BAYER LOW STRENGTH) 81 MG EC tablet Take 81 mg by mouth daily.      budesonide-formoterol (SYMBICORT) 160-4.5 MCG/ACT inhaler Inhale 2 puffs into the lungs 2 (two) times daily.      budesonide-formoterol (SYMBICORT) 80-4.5 MCG/ACT inhaler Inhale 2 puffs into the lungs 2 (two) times daily.      esomeprazole (NEXIUM) 40 MG capsule Take 40 mg by mouth daily before breakfast.      esomeprazole (NEXIUM) 40 MG packet Take 40 mg by mouth daily.      !! insulin aspart (NOVOLOG) 100 UNIT/ML injection Inject into the skin 3 (three) times daily before meals. Sliding scale    !! insulin aspart (NOVOLOG) 100 UNIT/ML injection Inject 10-40 Units into the skin daily as needed. Per sliding scale. Patient injects 10-40 units for levels above 150 to 300       insulin NPH-insulin regular (NOVOLIN 70/30) (70-30) 100 UNIT/ML injection Inject into the skin. 60 units am and 70 units at pm    !! meclizine (ANTIVERT) 25 MG tablet Take 25 mg by mouth 3 (three) times daily as needed.      !! meclizine (ANTIVERT) 25 MG tablet Take 25 mg by mouth daily as needed. For dizziness/nausea     !! metoprolol (LOPRESSOR) 50 MG tablet Take 50 mg by mouth daily at 6 (six) AM.      !! metoprolol tartrate (LOPRESSOR) 25 MG tablet Take 50 mg by mouth Nightly.     Multiple Vitamins-Minerals (CENTRUM SILVER PO) Take by mouth daily.     Multiple Vitamins-Minerals (CENTRUM SILVER PO) Take 1 tablet by mouth daily. OTC    pregabalin (LYRICA) 150 MG capsule Take 150 mg by mouth daily.     insulin NPH (HUMULIN N,NOVOLIN N) 100 UNIT/ML injection Inject into the skin. 70 units in am. 60 units q pm     VITAMIN D, CHOLECALCIFEROL, PO Take 1 tablet by mouth daily.       !! - Potential duplicate medications found. Please discuss with provider.    STOP taking these medications     lactulose, encephalopathy, (CHRONULAC) 10 GM/15ML SOLN        HISTORY OF PRESENT ILLNESS:  [name] [name] is a 70 year old female who has a  history of cirrhosis of  unclear etiology, who has been having some  confusion that has worsened over the last several days. She is already  on lactulose at home. She is compliant with this. She has  approximately 3-4 bowel movements a day. She has had an extensive  workup in the past as to what is the cause of her cirrhosis. She states  she was placed in a study about 3 years ago for her high cholesterol and  diabetes and it was thought that maybe her cirrhosis with from  medications she received during that study. She has been following a GI  physician. She does not have a history of significant alcohol use, and  she says she has never had a liver biopsy. Her mental status right now  is clear, but her family says that she has been more confused that has   been waxing and waning and she has been more somnolent than normal.  PHYSICAL EXAMINATION: VITAL SIGNS: Temperature is 97.8, pulse 60,  respirations 20, blood pressure 123/76, and 98% O2 sats on room air.  GENERAL: She is alert and oriented with x4 right now. No apparent  distress, cooperative and friendly.  HEENT: Extraocular movements intact. Pupils equal and reactive to  light. Oropharynx clear. Mucous membranes moist.  NECK: No JVD. No carotid bruit.  CARDIAC: Regular rate and rhythm without murmurs or gallops.  CHEST: Clear to auscultation bilaterally. No rhonchi or rales.  ABDOMEN: Soft, nontender, nondistended. Positive bowel sounds. No  hepatosplenomegaly. EXTREMITIES: No clubbing, cyanosis, or edema.  PSYCHIATRIC: Normal affect. No focal neurologic deficits.  Labs on admission: Electrolytes are normal. Glucose is 351. Ammonia level is 111. Total  bili is 2. Platelets 140. INR is 1.1.    Consults:  none   Significant Diagnostic Studies:  LEFT LOWER EXTREMITY VENOUS DUPLEX ULTRASOUND IMPRESSION: No evidence for deep venous thrombosis. Original Report Authenticated By: Jamesetta Orleans. MATTERN, M.D.      Hospital Course:  1 Acute metabolic encephalopathy most likely secondary to liver cirrhosis and or medications lyrica and Xanax. Her psychotropic medications were held on admission she was getting IV fluids her lactulose was increased. And by the next day her mentation was much improved. 2. Cirrhosis of liver, her lactulose was increased as above she will continue to take this. To have her bowel movements a day. 3. Hyperlipidemia,  no changes were made. 4. Essential hypertension, changes were made. 5. Uncontrolled type 2 diabetes mellitus with complication, on admission her insulin was held. As her mentation resolved she started to eat some her blood glucose was high. She was resumed on his 70/30 and has remained in around 200's. She will follow with her primary care doctor  to titrate her insulin as needed. Her hemoglobin A1c is 7.4 Disposition and Follow-up:  1.Followup with her gastroenterologist in 2-4 weeks. 2. Followup with her primary care doctor to titrate her insulin as needed in 2 weeks.   Signed: Marinda Elk 06/01/2011, 9:37 AM

## 2011-06-13 ENCOUNTER — Encounter: Payer: Self-pay | Admitting: General Practice

## 2011-06-18 ENCOUNTER — Telehealth: Payer: Self-pay | Admitting: General Practice

## 2011-06-18 ENCOUNTER — Other Ambulatory Visit: Payer: Self-pay | Admitting: General Practice

## 2011-06-18 DIAGNOSIS — K7460 Unspecified cirrhosis of liver: Secondary | ICD-10-CM

## 2011-06-18 NOTE — Telephone Encounter (Signed)
Pt scheduled for abd u/s 06/19/2011@10 :00a.m. I tried to call pt to give appt,no answer.

## 2011-06-18 NOTE — Telephone Encounter (Signed)
Pt is aware of appt

## 2011-06-19 ENCOUNTER — Ambulatory Visit (HOSPITAL_COMMUNITY)
Admission: RE | Admit: 2011-06-19 | Discharge: 2011-06-19 | Disposition: A | Discharge status: Home / Self Care | Payer: Medicare Other | Source: Ambulatory Visit | Attending: Internal Medicine | Admitting: Internal Medicine

## 2011-06-19 DIAGNOSIS — K7460 Unspecified cirrhosis of liver: Secondary | ICD-10-CM | POA: Insufficient documentation

## 2011-06-19 DIAGNOSIS — R161 Splenomegaly, not elsewhere classified: Secondary | ICD-10-CM | POA: Insufficient documentation

## 2011-06-28 ENCOUNTER — Encounter: Payer: Self-pay | Admitting: Gastroenterology

## 2011-06-28 ENCOUNTER — Ambulatory Visit (INDEPENDENT_AMBULATORY_CARE_PROVIDER_SITE_OTHER): Payer: Medicare Other | Admitting: Gastroenterology

## 2011-06-28 DIAGNOSIS — K7460 Unspecified cirrhosis of liver: Secondary | ICD-10-CM

## 2011-06-28 NOTE — Assessment & Plan Note (Addendum)
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 Resolved now. [place] of abd with concern for portal vein thrombosis, although exam was quite limited due to pt's body habitus. Mass noted on kidney as well. No confusion noted now. Do not believe lower extremity ankle edema is secondary to complications of cirrhosis. No ascites noted on exam. Pt is to see orthopedist soon due to ankle discomfort. Needs updated AFP and further evaluation of possible portal vein thrombosis. Discussed with Dr. Tyron Russell CT as well as Dr. Jena Gauss.  MRI w/wo contrast to evaluate hepatic vasculature and renal mass AFP, PT/INR (all other labs already completed while pt in hospital) Continue lactulose Surveillance EGD in Nov 2012 Low fat diet, encourage wt loss Further recommendations after MRI

## 2011-06-28 NOTE — Progress Notes (Signed)
Referring Provider: No ref. provider found Primary Care Physician:  Colette Ribas, MD Primary Gastroenterologist: Dr. Jena Gauss   Chief Complaint  Patient presents with  . Hospita follow-up    HPI:   Ms. 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 She is on lactulose. She reports 4-5 BMs per day, no confusion or mental status changes currently. Denies abdominal pain, rare nausea. Ate a biscuit today with mayonnaise on it and experienced nausea. Husband present in room and states mayonnaise does this to her.  Reports left lower extremity edema, mainly localized around ankle. Has pain on bottom of foot and ankle. Negative doppler for DVT. No RLE swelling. Denies jaundice, pruritis. No melena or brbpr.  Due for surveillance EGD in Nov 2012. Vaccinated for Hep A and B. Was 250 in Nov 2011, now 279.   [place] abd for [facility] Aug 2012: No detectable flow could be identified within the portal vein. Large body habitus made exam difficult. Mass noted on kidney, please see full report.   Past Medical History  Diagnosis Date  . CAD in native artery   . Thrombocytopenia, unspecified   . Unspecified hereditary and idiopathic peripheral neuropathy   . Unspecified fall   . Other and unspecified hyperlipidemia   . Unspecified essential hypertension   . Obesity, unspecified   . Shortness of breath   . Personal history of neurosis   . Type II or unspecified type diabetes mellitus without mention of complication, not stated as uncontrolled   . Intrinsic asthma, unspecified   . Allergic rhinitis, cause unspecified   . Cirrhosis of liver     Past Surgical History  Procedure Date  . Hysterectomy and btl     s/p  . Abdominal hysterectomy   . Tumor excision     rt arm and left foot    Current Outpatient Prescriptions  Medication Sig Dispense Refill  . ALPRAZolam (XANAX) 0.5 MG tablet Take 0.5 mg by mouth every 8 (eight) hours as needed.         Marland Kitchen aspirin (BAYER LOW STRENGTH) 81 MG EC tablet Take 81 mg by mouth daily.        . budesonide-formoterol (SYMBICORT) 160-4.5 MCG/ACT inhaler Inhale 2 puffs into the lungs 2 (two) times daily.        . enalapril (VASOTEC) 5 MG tablet Take 5 mg by mouth daily.        Marland Kitchen esomeprazole (NEXIUM) 40 MG capsule Take 40 mg by mouth daily before breakfast.        . insulin aspart (NOVOLOG) 100 UNIT/ML injection Inject 10-40 Units into the skin daily as needed. Per sliding scale. Patient injects 10-40 units for levels above 150 to 300       . insulin NPH-insulin regular (NOVOLIN 70/30) (70-30) 100 UNIT/ML injection Inject into the skin. 60 units am and 70 units at pm      . lactulose (CHRONULAC) 10 GM/15ML solution Take 20 g by mouth 3 (three) times daily. Takes 60 cc once daily in the AM       . meclizine (ANTIVERT) 25 MG tablet Take 25 mg by mouth 3 (three) times daily as needed.        . metoprolol (LOPRESSOR) 50 MG tablet Take 50 mg by mouth daily at 6 (six) AM.        . Multiple Vitamins-Minerals (CENTRUM SILVER PO) Take by mouth daily.       . pregabalin (LYRICA) 150 MG  capsule Take 150 mg by mouth daily.       Marland Kitchen VITAMIN D, CHOLECALCIFEROL, PO Take 1 tablet by mouth daily.        . budesonide-formoterol (SYMBICORT) 80-4.5 MCG/ACT inhaler Inhale 2 puffs into the lungs 2 (two) times daily.        Marland Kitchen esomeprazole (NEXIUM) 40 MG packet Take 40 mg by mouth daily.        . insulin aspart (NOVOLOG) 100 UNIT/ML injection Inject into the skin 3 (three) times daily before meals. Sliding scale      . insulin NPH (HUMULIN N,NOVOLIN N) 100 UNIT/ML injection Inject into the skin. 70 units in am. 60 units q pm       . meclizine (ANTIVERT) 25 MG tablet Take 25 mg by mouth daily as needed. For dizziness/nausea       . metoprolol tartrate (LOPRESSOR) 25 MG tablet Take 50 mg by mouth Nightly.       . Multiple Vitamins-Minerals (CENTRUM SILVER PO) Take 1 tablet by mouth daily. OTC        Allergies as of 06/28/2011 -  Review Complete 06/28/2011  Allergen Reaction Noted  . Prochlorperazine edisylate Other (See Comments)     Family History  Problem Relation Age of Onset  . Coronary artery disease      FH  . Diabetes      FH  . Heart disease Mother     History   Social History  . Marital Status: Married    Spouse Name: N/A    Number of Children: 3  . Years of Education: N/A   Social History Main Topics  . Smoking status: Never Smoker   . Smokeless tobacco: None  . Alcohol Use: No  . Drug Use: No  . Sexually Active: None   Other Topics Concern  . None   Social History Narrative  . None    Review of Systems: Gen: Denies fever, chills, anorexia. Denies fatigue, weakness, weight loss.  CV: Denies chest pain, palpitations, syncope, peripheral edema, and claudication. Resp: Denies dyspnea at rest, cough, wheezing, coughing up blood, and pleurisy. GI: Denies vomiting blood, jaundice, and fecal incontinence.   Denies dysphagia or odynophagia. Derm: Denies rash, itching, dry skin Psych: Denies depression, anxiety, memory loss, confusion. No homicidal or suicidal ideation.  Heme: Denies bruising, bleeding, and enlarged lymph nodes.  Physical Exam: BP 123/64  Pulse 66  Temp(Src) 98.5 F (36.9 C) (Tympanic)  Ht 5\' 8"  (1.727 m)  Wt 279 lb 9.6 oz (126.826 kg)  BMI 42.51 kg/m2 General:   Alert and oriented. No distress noted. Pleasant and cooperative.  Head:  Normocephalic and atraumatic. Eyes:  Conjuctiva clear without scleral icterus. Mouth:  Oral mucosa pink and moist. Good dentition. No lesions. Neck:  Supple, without mass or thyromegaly. Heart:  S1, S2 present without murmurs, rubs, or gallops. Regular rate and rhythm. Abdomen:  +BS, soft, largely obese, very difficult to appreciate HSM due to body habitus. non-tender and non-distended. No rebound or guarding. No ascites noted.  Msk:  Symmetrical without gross deformities. Normal posture. Extremities:  Trace edema left ankle, no  edema right lower extremity.  Neurologic:  Alert and  oriented x4;  grossly normal neurologically. Skin:  Intact without significant lesions or rashes. Cervical Nodes:  No significant cervical adenopathy. Psych:  Alert and cooperative. Normal mood and affect.

## 2011-06-28 NOTE — Patient Instructions (Signed)
We have set you up for an MRI to assess your liver and kidneys better.  Please complete labs.  We will be in touch very soon regarding next steps.

## 2011-06-29 ENCOUNTER — Ambulatory Visit (HOSPITAL_COMMUNITY)
Admission: RE | Admit: 2011-06-29 | Discharge: 2011-06-29 | Disposition: A | Discharge status: Home / Self Care | Payer: Medicare Other | Source: Ambulatory Visit | Attending: Gastroenterology | Admitting: Gastroenterology

## 2011-06-29 ENCOUNTER — Other Ambulatory Visit: Payer: Self-pay | Admitting: Gastroenterology

## 2011-06-29 DIAGNOSIS — K7460 Unspecified cirrhosis of liver: Secondary | ICD-10-CM | POA: Insufficient documentation

## 2011-06-29 DIAGNOSIS — R935 Abnormal findings on diagnostic imaging of other abdominal regions, including retroperitoneum: Secondary | ICD-10-CM | POA: Insufficient documentation

## 2011-06-29 DIAGNOSIS — R161 Splenomegaly, not elsewhere classified: Secondary | ICD-10-CM | POA: Insufficient documentation

## 2011-06-29 DIAGNOSIS — I81 Portal vein thrombosis: Secondary | ICD-10-CM | POA: Insufficient documentation

## 2011-06-29 DIAGNOSIS — N289 Disorder of kidney and ureter, unspecified: Secondary | ICD-10-CM | POA: Insufficient documentation

## 2011-06-29 LAB — AFP TUMOR MARKER: AFP-Tumor Marker: 2.6 ng/mL (ref 0.0–8.0)

## 2011-06-29 LAB — PROTIME-INR: INR: 1.13 (ref <1.50)

## 2011-06-29 MED ORDER — GADOBENATE DIMEGLUMINE 529 MG/ML IV SOLN
20.0000 mL | Freq: Once | INTRAVENOUS | Status: AC | PRN
  Administered 2011-06-29: 20 mL via INTRAVENOUS

## 2011-06-29 NOTE — Progress Notes (Signed)
Cc to PCP 

## 2011-07-03 NOTE — Progress Notes (Signed)
Quick Note:  Noted. MELD score: 4.  Needs AFP in 6 mos. Please have pt f/u in October with our office. Needs EGD for varices Nov 2012. ______

## 2011-07-04 ENCOUNTER — Other Ambulatory Visit: Payer: Self-pay | Admitting: Gastroenterology

## 2011-07-04 DIAGNOSIS — K7460 Unspecified cirrhosis of liver: Secondary | ICD-10-CM

## 2011-07-04 NOTE — Progress Notes (Signed)
Cc to PCP 

## 2011-07-09 ENCOUNTER — Encounter: Payer: Self-pay | Admitting: Internal Medicine

## 2011-07-12 ENCOUNTER — Encounter: Payer: Self-pay | Admitting: Orthopedic Surgery

## 2011-07-12 ENCOUNTER — Ambulatory Visit (INDEPENDENT_AMBULATORY_CARE_PROVIDER_SITE_OTHER): Payer: Medicare Other | Admitting: Orthopedic Surgery

## 2011-07-12 VITALS — HR 72 | Resp 22 | Wt 271.0 lb

## 2011-07-12 DIAGNOSIS — M76829 Posterior tibial tendinitis, unspecified leg: Secondary | ICD-10-CM | POA: Insufficient documentation

## 2011-07-12 DIAGNOSIS — M6789 Other specified disorders of synovium and tendon, multiple sites: Secondary | ICD-10-CM

## 2011-07-12 NOTE — Progress Notes (Signed)
Encounter addended by: Clarene Critchley on: 07/12/2011  6:43 AM<BR>     Documentation filed: Flowsheet VN

## 2011-07-12 NOTE — Patient Instructions (Signed)
We're Cam Walker for all ambulation for 6 weeks and return for reevaluation.  Soak foot and ice bath 20 minutes 3 times a day

## 2011-07-12 NOTE — Progress Notes (Signed)
   The patient has been referred as a new patient from [facility]  She complains of pain on the medial side of her LEFT ankle has been present for one month and start gradually.  She denies any trauma.  She describes sharp stabbing burning pain 8/10 which comes and goes and is associated with medial swelling.  She has noted Progressive flatfoot deformity.  All systems were reviewed total of 14 she reported fatigue shortness of breath wheezing unsteady gait dizziness and nervousness.  Past Medical History  Diagnosis Date  . CAD in native artery   . Thrombocytopenia, unspecified   . Unspecified hereditary and idiopathic peripheral neuropathy   . Unspecified fall   . Other and unspecified hyperlipidemia   . Unspecified essential hypertension   . Obesity, unspecified   . Shortness of breath   . Personal history of neurosis   . Type II or unspecified type diabetes mellitus without mention of complication, not stated as uncontrolled   . Intrinsic asthma, unspecified   . Allergic rhinitis, cause unspecified   . Cirrhosis of liver     Past Surgical History  Procedure Date  . Hysterectomy and btl     s/p  . Abdominal hysterectomy   . Tumor excision     rt arm and left foot     Examination her vital signs are weight 271 pounds pulse 72 pounds respiratory rate 32  The patient has mild obesity the general appearance is normal She is oriented to person she seems to have trouble with comprehension her mood and affect are flat.  Her husband tells [place] that she is recently diagnosed with cirrhosis  Her ambulation is slow and unsteady.  He has a obvious flatfoot deformity.  Examination of the LEFT ankle reveals tenderness of the posterior tibial tendon with swelling along the posterior tibial tendon sheath.  She has normal plantarflexion and dorsiflexion and her subtalar joint is still mobile.  The ankle itself is stable.  She is weak posterior tibial tendon with a poor single-leg  heel rise.  She is a positive too many toes sign.  Skin is intact.  Pulse and temperature are normal.  Sensation is normal.  No pathologic reflexes.  Balance again is poor.  X-rays were obtained:X-rays show non-collinear line between TALUS and midfoot showing joint collapse.  Midfoot arthritis mild.  Impression osteoarthritis with flatfoot deformity  Diagnoses Posterior tibial tendon dysfunction  Plan Cam Walker x 6 weeks reevaluate at that time if no improvement refer her to foot and ankle specialist

## 2011-08-28 ENCOUNTER — Ambulatory Visit: Payer: Medicare Other | Admitting: Orthopedic Surgery

## 2011-09-14 ENCOUNTER — Ambulatory Visit (INDEPENDENT_AMBULATORY_CARE_PROVIDER_SITE_OTHER): Payer: Medicare Other | Admitting: Internal Medicine

## 2011-09-14 ENCOUNTER — Encounter: Payer: Self-pay | Admitting: Internal Medicine

## 2011-09-14 DIAGNOSIS — K7460 Unspecified cirrhosis of liver: Secondary | ICD-10-CM

## 2011-09-14 DIAGNOSIS — G9349 Other encephalopathy: Secondary | ICD-10-CM | POA: Insufficient documentation

## 2011-09-14 NOTE — Progress Notes (Signed)
Primary Care Physician:  Colette Ribas, MD Primary Gastroenterologist:  Dr.   Pre-Procedure History & Physical: HPI:  [name] [name] is a 70 y.o. female here for [name] cirrhosis. Husband says no encephalopathy symptoms lately. She takes 60 cc of lactulose daily on average 5 bowel movements. Rare episodes of incontinence. Having trouble getting lactulose from [employer]. Having trouble with her left foot. She's going to get an injection in it in the near future. She also states she's never had hepatitis A vaccine as implied in our last office note. This needs to be checked into further. She states she has had hepatitis B. She's going to get the flu vaccine from Dr. Phillips Odor in the near future. She is due for a screening EGD this month  Past Medical History  Diagnosis Date  . CAD in native artery   . Thrombocytopenia, unspecified   . Unspecified hereditary and idiopathic peripheral neuropathy   . Unspecified fall   . Other and unspecified hyperlipidemia   . Unspecified essential hypertension   . Obesity, unspecified   . Shortness of breath   . Personal history of neurosis   . Type II or unspecified type diabetes mellitus without mention of complication, not stated as uncontrolled   . Intrinsic asthma, unspecified   . Allergic rhinitis, cause unspecified   . Cirrhosis of liver   . Colon adenomas 03/08/10    tcs by Dr. Jena Gauss  . Hyperplastic polyps of stomach 03/08/10    tcs by Dr. Geni Bers  . Chronic gastritis 03/09/11    egd by Dr. Jena Gauss  . History of hemorrhoids 03/08/10    tcs- internal and external  . Diverticula of colon 03/08/10    L side  . Hiatal hernia 03/08/10  . Esophagitis, erosive 03/08/10    Past Surgical History  Procedure Date  . Hysterectomy and btl     s/p  . Abdominal hysterectomy   . Tumor excision     rt arm and left foot  . Colonoscopy 03/08/10    Dr. Jena Gauss  . Esophagogastroduodenoscopy 03/08/10    Dr. Jena Gauss    Prior to Admission medications   Medication Sig  Start Date End Date Taking? Authorizing Provider  ALPRAZolam Prudy Feeler) 0.5 MG tablet Take 0.5 mg by mouth every 8 (eight) hours as needed.    Yes Historical Provider, MD  aspirin (BAYER LOW STRENGTH) 81 MG EC tablet Take 81 mg by mouth daily.     Yes Historical Provider, MD  budesonide-formoterol (SYMBICORT) 160-4.5 MCG/ACT inhaler Inhale 2 puffs into the lungs 2 (two) times daily.     Yes Historical Provider, MD  enalapril (VASOTEC) 5 MG tablet Take 5 mg by mouth daily.     Yes Historical Provider, MD  esomeprazole (NEXIUM) 40 MG capsule Take 40 mg by mouth daily before breakfast.     Yes Historical Provider, MD  insulin aspart (NOVOLOG) 100 UNIT/ML injection Inject into the skin 3 (three) times daily before meals. Sliding scale   Yes Historical Provider, MD  insulin aspart (NOVOLOG) 100 UNIT/ML injection Inject 10-40 Units into the skin daily as needed. Per sliding scale. Patient injects 10-40 units for levels above 150 to 300    Yes Historical Provider, MD  insulin detemir (LEVEMIR) 100 UNIT/ML injection Inject 50 Units into the skin 3 (three) times daily.     Yes Historical Provider, MD  lactulose (CHRONULAC) 10 GM/15ML solution Take 20 g by mouth 3 (three) times daily. Takes 60 cc once daily in the AM  Yes Historical Provider, MD  meclizine (ANTIVERT) 25 MG tablet Take 25 mg by mouth daily as needed. For dizziness/nausea    Yes Historical Provider, MD  metoprolol (LOPRESSOR) 50 MG tablet Take 50 mg by mouth daily at 6 (six) AM.     Yes Historical Provider, MD  Multiple Vitamins-Minerals (CENTRUM SILVER PO) Take by mouth daily.    Yes Historical Provider, MD  pregabalin (LYRICA) 150 MG capsule Take 150 mg by mouth daily.    Yes Historical Provider, MD  traMADol (ULTRAM) 50 MG tablet Take 50 mg by mouth every 6 (six) hours as needed.     Yes Historical Provider, MD  VITAMIN D, CHOLECALCIFEROL, PO Take 1 tablet by mouth daily.     Yes Historical Provider, MD  budesonide-formoterol (SYMBICORT)  80-4.5 MCG/ACT inhaler Inhale 2 puffs into the lungs 2 (two) times daily.      Historical Provider, MD  esomeprazole (NEXIUM) 40 MG packet Take 40 mg by mouth daily.      Historical Provider, MD  insulin NPH (HUMULIN N,NOVOLIN N) 100 UNIT/ML injection Inject into the skin. 70 units in am. 60 units q pm    Historical Provider, MD  insulin NPH-insulin regular (NOVOLIN 70/30) (70-30) 100 UNIT/ML injection Inject into the skin. 60 units am and 70 units at pm    Historical Provider, MD  meclizine (ANTIVERT) 25 MG tablet Take 25 mg by mouth 3 (three) times daily as needed.      Historical Provider, MD  metoprolol tartrate (LOPRESSOR) 25 MG tablet Take 50 mg by mouth Nightly.     Historical Provider, MD  Multiple Vitamins-Minerals (CENTRUM SILVER PO) Take 1 tablet by mouth daily. OTC    Historical Provider, MD    Allergies as of 09/14/2011 - Review Complete 09/14/2011  Allergen Reaction Noted  . Compazine  07/12/2011  . Prochlorperazine edisylate Other (See Comments)     Family History  Problem Relation Age of Onset  . Coronary artery disease      FH  . Diabetes      FH  . Heart disease Mother     History   Social History  . Marital Status: Married    Spouse Name: N/A    Number of Children: 3  . Years of Education: N/A   Occupational History  . Not on file.   Social History Main Topics  . Smoking status: Never Smoker   . Smokeless tobacco: Not on file  . Alcohol Use: No  . Drug Use: No  . Sexually Active: Not on file   Other Topics Concern  . Not on file   Social History Narrative  . No narrative on file    Review of Systems: See HPI, otherwise negative ROS  Physical Exam: BP 124/67  Pulse 80  Temp(Src) 98 F (36.7 C) (Temporal)  Ht 5\' 8"  (1.727 m)  Wt 269 lb 3.2 oz (122.108 kg)  BMI 40.93 kg/m2 General:   Alert,  Well-developed, well-nourished, pleasant and cooperative in NAD. No flap Eyes:  Sclera clear, no icterus.   Conjunctiva pink. Neck:  Supple; no masses  or thyromegaly. Abdomen:  Obese. Positive bowel sounds. Liver edge a good hand width below right costal margin smooth and nontender. Spleen palpable. No obvious ascites.  Extremities:  Without clubbing or edema. Trace lower extremity  edema Neurologic:  Alert and  oriented x4;  grossly normal neurologically. Impression/Plan:

## 2011-09-14 NOTE — Patient Instructions (Signed)
We'll set up an EGD in the near future to check for esophageal varices  We'll also check to see about your hepatitis a vaccine  He will continue lactulose at your current dose. We will check with the pharmacy to see about supply.

## 2011-09-14 NOTE — Assessment & Plan Note (Addendum)
History of encephalopathy with elevated ammonia. She appears to be back to baseline. I feel the dose of lactulose now is appropriate.  We'll need to investigate her hepatitis A status further  Recommendations: Screening EGD in the near future. Risks, benefits, limitations, alternatives and imponderables have been reviewed with the patient. Potential for esophageal dilation, biopsy etc. Have also been reviewed.  Questions have been answered. All parties agreeable.  Ultrasound and alpha-fetoprotein at 6 month intervals

## 2011-09-25 ENCOUNTER — Encounter (HOSPITAL_COMMUNITY): Payer: Self-pay | Admitting: Pharmacy Technician

## 2011-10-08 ENCOUNTER — Ambulatory Visit (HOSPITAL_COMMUNITY)
Admission: RE | Admit: 2011-10-08 | Discharge: 2011-10-08 | Disposition: A | Discharge status: Home / Self Care | Payer: Medicare Other | Source: Ambulatory Visit | Attending: Internal Medicine | Admitting: Internal Medicine

## 2011-10-08 ENCOUNTER — Encounter (HOSPITAL_COMMUNITY)
Admission: RE | Disposition: A | Discharge status: Home / Self Care | Payer: Self-pay | Source: Ambulatory Visit | Attending: Internal Medicine

## 2011-10-08 ENCOUNTER — Encounter (HOSPITAL_COMMUNITY): Payer: Self-pay | Admitting: *Deleted

## 2011-10-08 DIAGNOSIS — K2990 Gastroduodenitis, unspecified, without bleeding: Secondary | ICD-10-CM

## 2011-10-08 DIAGNOSIS — K7460 Unspecified cirrhosis of liver: Secondary | ICD-10-CM | POA: Insufficient documentation

## 2011-10-08 DIAGNOSIS — E119 Type 2 diabetes mellitus without complications: Secondary | ICD-10-CM | POA: Insufficient documentation

## 2011-10-08 DIAGNOSIS — K2970 Gastritis, unspecified, without bleeding: Secondary | ICD-10-CM

## 2011-10-08 DIAGNOSIS — I10 Essential (primary) hypertension: Secondary | ICD-10-CM | POA: Insufficient documentation

## 2011-10-08 DIAGNOSIS — G9349 Other encephalopathy: Secondary | ICD-10-CM

## 2011-10-08 DIAGNOSIS — Z01812 Encounter for preprocedural laboratory examination: Secondary | ICD-10-CM | POA: Insufficient documentation

## 2011-10-08 DIAGNOSIS — Z794 Long term (current) use of insulin: Secondary | ICD-10-CM | POA: Insufficient documentation

## 2011-10-08 DIAGNOSIS — Z79899 Other long term (current) drug therapy: Secondary | ICD-10-CM | POA: Insufficient documentation

## 2011-10-08 HISTORY — DX: Gastro-esophageal reflux disease without esophagitis: K21.9

## 2011-10-08 HISTORY — PX: ESOPHAGOGASTRODUODENOSCOPY: SHX5428

## 2011-10-08 LAB — GLUCOSE, CAPILLARY: Glucose-Capillary: 177 mg/dL — ABNORMAL HIGH (ref 70–99)

## 2011-10-08 SURGERY — EGD (ESOPHAGOGASTRODUODENOSCOPY)
Anesthesia: Moderate Sedation

## 2011-10-08 MED ORDER — MEPERIDINE HCL 100 MG/ML IJ SOLN
INTRAMUSCULAR | Status: AC
  Filled 2011-10-08: qty 2

## 2011-10-08 MED ORDER — MEPERIDINE HCL 100 MG/ML IJ SOLN
INTRAMUSCULAR | Status: DC | PRN
  Administered 2011-10-08: 25 mg via INTRAVENOUS
  Administered 2011-10-08: 50 mg via INTRAVENOUS

## 2011-10-08 MED ORDER — MIDAZOLAM HCL 5 MG/5ML IJ SOLN
INTRAMUSCULAR | Status: DC | PRN
  Administered 2011-10-08 (×2): 1 mg via INTRAVENOUS
  Administered 2011-10-08: 2 mg via INTRAVENOUS

## 2011-10-08 MED ORDER — BUTAMBEN-TETRACAINE-BENZOCAINE 2-2-14 % EX AERO
INHALATION_SPRAY | CUTANEOUS | Status: DC | PRN
  Administered 2011-10-08: 2 via TOPICAL

## 2011-10-08 MED ORDER — MIDAZOLAM HCL 5 MG/5ML IJ SOLN
INTRAMUSCULAR | Status: AC
  Filled 2011-10-08: qty 10

## 2011-10-08 MED ORDER — SODIUM CHLORIDE 0.45 % IV SOLN
Freq: Once | INTRAVENOUS | Status: AC
  Administered 2011-10-08: 10:00:00 via INTRAVENOUS

## 2011-10-08 MED ORDER — STERILE WATER FOR IRRIGATION IR SOLN
Status: DC | PRN
  Administered 2011-10-08: 11:00:00

## 2011-10-08 NOTE — H&P (Signed)
  I have seen & examined the patient prior to the procedure(s) today and reviewed the history and physical/consultation.  There have been no changes.  After consideration of the risks, benefits, alternatives and imponderables, the patient has consented to the procedure(s).   

## 2011-10-17 ENCOUNTER — Encounter (HOSPITAL_COMMUNITY): Payer: Self-pay | Admitting: Internal Medicine

## 2011-12-14 ENCOUNTER — Other Ambulatory Visit: Payer: Self-pay | Admitting: Gastroenterology

## 2011-12-17 NOTE — Progress Notes (Signed)
Quick Note:  Noted. Repeat in 6 mos. Needs [place] of abd now, then repeat in 6 mos.  Office visit May 2013. ______

## 2011-12-18 ENCOUNTER — Other Ambulatory Visit: Payer: Self-pay | Admitting: Gastroenterology

## 2011-12-18 DIAGNOSIS — K7460 Unspecified cirrhosis of liver: Secondary | ICD-10-CM

## 2011-12-19 ENCOUNTER — Other Ambulatory Visit: Payer: Self-pay | Admitting: Gastroenterology

## 2011-12-19 DIAGNOSIS — K7460 Unspecified cirrhosis of liver: Secondary | ICD-10-CM

## 2011-12-21 ENCOUNTER — Ambulatory Visit (HOSPITAL_COMMUNITY)
Admission: RE | Admit: 2011-12-21 | Discharge: 2011-12-21 | Disposition: A | Discharge status: Home / Self Care | Payer: Medicare Other | Source: Ambulatory Visit | Attending: Gastroenterology | Admitting: Gastroenterology

## 2011-12-21 DIAGNOSIS — K7460 Unspecified cirrhosis of liver: Secondary | ICD-10-CM

## 2011-12-21 DIAGNOSIS — R748 Abnormal levels of other serum enzymes: Secondary | ICD-10-CM | POA: Insufficient documentation

## 2011-12-25 ENCOUNTER — Telehealth: Payer: Self-pay | Admitting: Gastroenterology

## 2011-12-25 NOTE — Telephone Encounter (Signed)
Pt called wanting her results from her US done last week, she can be reached at [phone]

## 2011-12-25 NOTE — Progress Notes (Signed)
Quick Note:    Her last [place] was concerning for portal vein thrombosis and also renal mass. 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 CT was suggested. This was told to patient and faxed to her PCP.   First: did she have a CT performed?  Second: I will discuss with Dr. Tyron Russell the best method for following up on this specific [place], as now CBD seems to be more dilated then previously. However, noted in march 2011, CBD was 7mm. Is she having any pain or issues?  We will be in touch with the next test that needs to be done. ______

## 2011-12-25 NOTE — Telephone Encounter (Signed)
Routed to AS 

## 2011-12-26 ENCOUNTER — Telehealth: Payer: Self-pay | Admitting: Internal Medicine

## 2011-12-26 NOTE — Telephone Encounter (Signed)
Patient is calling for [place] results, Please advise??

## 2011-12-26 NOTE — Progress Notes (Signed)
Quick Note:  Tried to call pt- phone number busy. ______

## 2011-12-26 NOTE — Telephone Encounter (Signed)
Tried to call pt- phone number busy. 

## 2011-12-27 ENCOUNTER — Telehealth: Payer: Self-pay | Admitting: Internal Medicine

## 2011-12-27 NOTE — Telephone Encounter (Signed)
Pt has already had AFP done and future order on file.

## 2011-12-27 NOTE — Telephone Encounter (Signed)
Feb 2013 recall list has patient is due AFP in 6 months

## 2011-12-27 NOTE — Telephone Encounter (Signed)
Spoke with pt

## 2011-12-28 ENCOUNTER — Ambulatory Visit (HOSPITAL_COMMUNITY)
Admission: RE | Admit: 2011-12-28 | Discharge: 2011-12-28 | Disposition: A | Discharge status: Home / Self Care | Payer: Medicare Other | Source: Ambulatory Visit | Attending: Family Medicine | Admitting: Family Medicine

## 2011-12-28 ENCOUNTER — Other Ambulatory Visit (HOSPITAL_COMMUNITY): Payer: Self-pay | Admitting: Family Medicine

## 2011-12-28 DIAGNOSIS — R509 Fever, unspecified: Secondary | ICD-10-CM | POA: Insufficient documentation

## 2011-12-28 DIAGNOSIS — R05 Cough: Secondary | ICD-10-CM

## 2011-12-28 DIAGNOSIS — R059 Cough, unspecified: Secondary | ICD-10-CM | POA: Insufficient documentation

## 2011-12-28 DIAGNOSIS — R0602 Shortness of breath: Secondary | ICD-10-CM | POA: Insufficient documentation

## 2012-01-03 NOTE — Telephone Encounter (Signed)
Ultrasound not significantly different than prior study. However CAT scan recommended over 6 months ago not done. At this point, I would repeat the MRI of the liver (the abdomen including the kidneys) to evaluate the left kidney as suggested by the radiologist -  this we'll get a good look at the liver once again as well

## 2012-01-08 NOTE — Telephone Encounter (Signed)
Crystal, please schedule MRI

## 2012-01-08 NOTE — Progress Notes (Signed)
Quick Note:  Pt set up for MRI per RMR. Please see notes under telephone call. Thanks! ______

## 2012-01-09 ENCOUNTER — Other Ambulatory Visit: Payer: Self-pay | Admitting: Gastroenterology

## 2012-01-09 DIAGNOSIS — N2889 Other specified disorders of kidney and ureter: Secondary | ICD-10-CM

## 2012-01-09 NOTE — Telephone Encounter (Signed)
MRI scheduled for 03/06 @ 8am- pt aware

## 2012-01-16 ENCOUNTER — Ambulatory Visit (HOSPITAL_COMMUNITY)
Admission: RE | Admit: 2012-01-16 | Discharge: 2012-01-16 | Disposition: A | Discharge status: Home / Self Care | Payer: Medicare Other | Source: Ambulatory Visit | Attending: Internal Medicine | Admitting: Internal Medicine

## 2012-01-16 DIAGNOSIS — N289 Disorder of kidney and ureter, unspecified: Secondary | ICD-10-CM | POA: Insufficient documentation

## 2012-01-16 DIAGNOSIS — K7460 Unspecified cirrhosis of liver: Secondary | ICD-10-CM | POA: Insufficient documentation

## 2012-01-16 DIAGNOSIS — D3000 Benign neoplasm of unspecified kidney: Secondary | ICD-10-CM | POA: Insufficient documentation

## 2012-01-16 DIAGNOSIS — N2889 Other specified disorders of kidney and ureter: Secondary | ICD-10-CM

## 2012-01-16 LAB — CREATININE, SERUM: GFR calc non Af Amer: 87 mL/min — ABNORMAL LOW (ref >90)

## 2012-01-16 MED ORDER — GADOBENATE DIMEGLUMINE 529 MG/ML IV SOLN
20.0000 mL | Freq: Once | INTRAVENOUS | Status: AC | PRN
  Administered 2012-01-16: 20 mL via INTRAVENOUS

## 2012-03-26 ENCOUNTER — Inpatient Hospital Stay (HOSPITAL_COMMUNITY)
Admission: EM | Admit: 2012-03-26 | Discharge: 2012-03-28 | DRG: 694 | Disposition: A | Discharge status: Home / Self Care | Payer: Medicare Other | Attending: Internal Medicine | Admitting: Internal Medicine

## 2012-03-26 ENCOUNTER — Emergency Department (HOSPITAL_COMMUNITY): Payer: Medicare Other

## 2012-03-26 ENCOUNTER — Encounter (HOSPITAL_COMMUNITY): Payer: Self-pay | Admitting: *Deleted

## 2012-03-26 DIAGNOSIS — J45909 Unspecified asthma, uncomplicated: Secondary | ICD-10-CM | POA: Diagnosis present

## 2012-03-26 DIAGNOSIS — I10 Essential (primary) hypertension: Secondary | ICD-10-CM | POA: Diagnosis present

## 2012-03-26 DIAGNOSIS — K7290 Hepatic failure, unspecified without coma: Secondary | ICD-10-CM

## 2012-03-26 DIAGNOSIS — E119 Type 2 diabetes mellitus without complications: Secondary | ICD-10-CM

## 2012-03-26 DIAGNOSIS — D696 Thrombocytopenia, unspecified: Secondary | ICD-10-CM | POA: Diagnosis present

## 2012-03-26 DIAGNOSIS — E785 Hyperlipidemia, unspecified: Secondary | ICD-10-CM

## 2012-03-26 DIAGNOSIS — K7460 Unspecified cirrhosis of liver: Secondary | ICD-10-CM

## 2012-03-26 DIAGNOSIS — N201 Calculus of ureter: Secondary | ICD-10-CM

## 2012-03-26 DIAGNOSIS — IMO0002 Reserved for concepts with insufficient information to code with codable children: Secondary | ICD-10-CM | POA: Diagnosis present

## 2012-03-26 DIAGNOSIS — E1165 Type 2 diabetes mellitus with hyperglycemia: Secondary | ICD-10-CM

## 2012-03-26 DIAGNOSIS — N23 Unspecified renal colic: Secondary | ICD-10-CM

## 2012-03-26 DIAGNOSIS — M76829 Posterior tibial tendinitis, unspecified leg: Secondary | ICD-10-CM

## 2012-03-26 DIAGNOSIS — Z8659 Personal history of other mental and behavioral disorders: Secondary | ICD-10-CM

## 2012-03-26 DIAGNOSIS — I2510 Atherosclerotic heart disease of native coronary artery without angina pectoris: Secondary | ICD-10-CM | POA: Diagnosis present

## 2012-03-26 DIAGNOSIS — Z86010 Personal history of colonic polyps: Secondary | ICD-10-CM

## 2012-03-26 DIAGNOSIS — E669 Obesity, unspecified: Secondary | ICD-10-CM | POA: Diagnosis present

## 2012-03-26 DIAGNOSIS — R0602 Shortness of breath: Secondary | ICD-10-CM

## 2012-03-26 DIAGNOSIS — K7689 Other specified diseases of liver: Secondary | ICD-10-CM

## 2012-03-26 DIAGNOSIS — J309 Allergic rhinitis, unspecified: Secondary | ICD-10-CM

## 2012-03-26 DIAGNOSIS — G9349 Other encephalopathy: Secondary | ICD-10-CM

## 2012-03-26 DIAGNOSIS — G609 Hereditary and idiopathic neuropathy, unspecified: Secondary | ICD-10-CM

## 2012-03-26 DIAGNOSIS — K219 Gastro-esophageal reflux disease without esophagitis: Secondary | ICD-10-CM

## 2012-03-26 DIAGNOSIS — E1142 Type 2 diabetes mellitus with diabetic polyneuropathy: Secondary | ICD-10-CM | POA: Diagnosis present

## 2012-03-26 LAB — DIFFERENTIAL
Eosinophils Absolute: 0 10*3/uL (ref 0.0–0.7)
Eosinophils Relative: 1 % (ref 0–5)
Lymphocytes Relative: 30 % (ref 12–46)
Lymphs Abs: 1.1 10*3/uL (ref 0.7–4.0)
Monocytes Absolute: 0.5 10*3/uL (ref 0.1–1.0)
Monocytes Relative: 13 % — ABNORMAL HIGH (ref 3–12)

## 2012-03-26 LAB — COMPREHENSIVE METABOLIC PANEL
ALT: 18 U/L (ref 0–35)
BUN: 15 mg/dL (ref 6–23)
CO2: 20 mEq/L (ref 19–32)
Calcium: 9.1 mg/dL (ref 8.4–10.5)
Creatinine, Ser: 0.69 mg/dL (ref 0.50–1.10)
GFR calc Af Amer: >90 mL/min (ref >90)
GFR calc non Af Amer: 87 mL/min — ABNORMAL LOW (ref >90)
Glucose, Bld: 238 mg/dL — ABNORMAL HIGH (ref 70–99)
Total Protein: 6.1 g/dL (ref 6.0–8.3)

## 2012-03-26 LAB — CBC
HCT: 40.2 % (ref 36.0–46.0)
Hemoglobin: 14.1 g/dL (ref 12.0–15.0)
MCH: 29.5 pg (ref 26.0–34.0)
MCV: 84.1 fL (ref 78.0–100.0)
Platelets: 129 10*3/uL — ABNORMAL LOW (ref 150–400)
RBC: 4.78 MIL/uL (ref 3.87–5.11)
WBC: 3.6 10*3/uL — ABNORMAL LOW (ref 4.0–10.5)

## 2012-03-26 LAB — URINALYSIS, ROUTINE W REFLEX MICROSCOPIC
Glucose, UA: 100 mg/dL — AB
Specific Gravity, Urine: 1.015 (ref 1.005–1.030)
Urobilinogen, UA: 1 mg/dL (ref 0.0–1.0)

## 2012-03-26 LAB — URINE MICROSCOPIC-ADD ON

## 2012-03-26 LAB — LIPASE, BLOOD: Lipase: 25 U/L (ref 11–59)

## 2012-03-26 MED ORDER — ACETAMINOPHEN 650 MG RE SUPP: 650.0000 mg | Freq: Four times a day (QID) | RECTAL | Status: DC | PRN

## 2012-03-26 MED ORDER — ENOXAPARIN SODIUM 40 MG/0.4ML ~~LOC~~ SOLN
40.0000 mg | SUBCUTANEOUS | Status: DC
  Administered 2012-03-26 – 2012-03-27 (×2): 40 mg via SUBCUTANEOUS
  Filled 2012-03-26 (×2): qty 0.4

## 2012-03-26 MED ORDER — ONDANSETRON HCL 4 MG/2ML IJ SOLN: 4.0000 mg | Freq: Three times a day (TID) | INTRAMUSCULAR | Status: DC | PRN

## 2012-03-26 MED ORDER — LACTULOSE 10 GM/15ML PO SOLN
40.0000 g | Freq: Two times a day (BID) | ORAL | Status: DC
  Administered 2012-03-27 – 2012-03-28 (×3): 40 g via ORAL
  Filled 2012-03-26 (×4): qty 60

## 2012-03-26 MED ORDER — HYDROMORPHONE HCL PF 1 MG/ML IJ SOLN: 1.0000 mg | INTRAMUSCULAR | Status: DC | PRN

## 2012-03-26 MED ORDER — INSULIN DETEMIR 100 UNIT/ML ~~LOC~~ SOLN
50.0000 [IU] | Freq: Every day | SUBCUTANEOUS | Status: DC
  Administered 2012-03-26 – 2012-03-27 (×2): 50 [IU] via SUBCUTANEOUS
  Filled 2012-03-26: qty 10

## 2012-03-26 MED ORDER — HYDROMORPHONE HCL PF 1 MG/ML IJ SOLN
INTRAMUSCULAR | Status: AC
  Filled 2012-03-26: qty 1

## 2012-03-26 MED ORDER — INSULIN ASPART 100 UNIT/ML ~~LOC~~ SOLN
0.0000 [IU] | Freq: Three times a day (TID) | SUBCUTANEOUS | Status: DC
  Administered 2012-03-27: 5 [IU] via SUBCUTANEOUS
  Administered 2012-03-27 – 2012-03-28 (×2): 3 [IU] via SUBCUTANEOUS

## 2012-03-26 MED ORDER — PANTOPRAZOLE SODIUM 40 MG PO TBEC
80.0000 mg | DELAYED_RELEASE_TABLET | Freq: Every day | ORAL | Status: DC
  Administered 2012-03-27: 80 mg via ORAL
  Filled 2012-03-26 (×2): qty 2

## 2012-03-26 MED ORDER — INSULIN DETEMIR 100 UNIT/ML ~~LOC~~ SOLN
SUBCUTANEOUS | Status: AC
  Filled 2012-03-26: qty 10

## 2012-03-26 MED ORDER — TRAZODONE HCL 50 MG PO TABS: 25.0000 mg | ORAL_TABLET | Freq: Every evening | ORAL | Status: DC | PRN

## 2012-03-26 MED ORDER — HYDROMORPHONE HCL PF 1 MG/ML IJ SOLN
1.0000 mg | Freq: Once | INTRAMUSCULAR | Status: AC
  Administered 2012-03-26: 1 mg via INTRAVENOUS
  Filled 2012-03-26: qty 1

## 2012-03-26 MED ORDER — HYDROMORPHONE HCL PF 1 MG/ML IJ SOLN
1.0000 mg | INTRAMUSCULAR | Status: DC | PRN
  Administered 2012-03-26: 1 mg via INTRAVENOUS

## 2012-03-26 MED ORDER — OXYCODONE HCL 5 MG PO TABS: 5.0000 mg | ORAL_TABLET | ORAL | Status: DC | PRN

## 2012-03-26 MED ORDER — INSULIN DETEMIR 100 UNIT/ML ~~LOC~~ SOLN
60.0000 [IU] | Freq: Every day | SUBCUTANEOUS | Status: DC
  Filled 2012-03-26: qty 10

## 2012-03-26 MED ORDER — ASPIRIN EC 81 MG PO TBEC
81.0000 mg | DELAYED_RELEASE_TABLET | Freq: Every day | ORAL | Status: DC
  Administered 2012-03-27 – 2012-03-28 (×2): 81 mg via ORAL
  Filled 2012-03-26 (×3): qty 1

## 2012-03-26 MED ORDER — PREGABALIN 50 MG PO CAPS
100.0000 mg | ORAL_CAPSULE | Freq: Every day | ORAL | Status: DC
  Administered 2012-03-27 – 2012-03-28 (×2): 100 mg via ORAL
  Filled 2012-03-26 (×2): qty 2

## 2012-03-26 MED ORDER — ONDANSETRON HCL 4 MG/2ML IJ SOLN
4.0000 mg | Freq: Once | INTRAMUSCULAR | Status: AC
  Administered 2012-03-26: 4 mg via INTRAVENOUS
  Filled 2012-03-26: qty 2

## 2012-03-26 MED ORDER — ONDANSETRON HCL 4 MG/2ML IJ SOLN
4.0000 mg | Freq: Four times a day (QID) | INTRAMUSCULAR | Status: DC | PRN
  Administered 2012-03-27: 4 mg via INTRAVENOUS
  Filled 2012-03-26: qty 2

## 2012-03-26 MED ORDER — ONDANSETRON HCL 4 MG PO TABS: 4.0000 mg | ORAL_TABLET | ORAL | Status: DC | PRN

## 2012-03-26 MED ORDER — ALPRAZOLAM 0.5 MG PO TABS
0.5000 mg | ORAL_TABLET | Freq: Three times a day (TID) | ORAL | Status: DC | PRN
  Administered 2012-03-27: 0.5 mg via ORAL
  Filled 2012-03-26 (×2): qty 1

## 2012-03-26 MED ORDER — CLOTRIMAZOLE 1 % EX CREA
TOPICAL_CREAM | Freq: Two times a day (BID) | CUTANEOUS | Status: DC
  Administered 2012-03-27 (×2): via TOPICAL
  Filled 2012-03-26: qty 15

## 2012-03-26 MED ORDER — ACETAMINOPHEN 500 MG PO TABS: 500.0000 mg | ORAL_TABLET | Freq: Four times a day (QID) | ORAL | Status: DC | PRN

## 2012-03-26 MED ORDER — BUDESONIDE-FORMOTEROL FUMARATE 160-4.5 MCG/ACT IN AERO
2.0000 | INHALATION_SPRAY | Freq: Two times a day (BID) | RESPIRATORY_TRACT | Status: DC
  Administered 2012-03-27 – 2012-03-28 (×3): 2 via RESPIRATORY_TRACT
  Filled 2012-03-26: qty 6

## 2012-03-26 MED ORDER — POTASSIUM CHLORIDE IN NACL 20-0.9 MEQ/L-% IV SOLN
INTRAVENOUS | Status: DC
  Administered 2012-03-26 – 2012-03-27 (×2): via INTRAVENOUS

## 2012-03-26 MED ORDER — IOHEXOL 300 MG/ML  SOLN
100.0000 mL | Freq: Once | INTRAMUSCULAR | Status: AC | PRN
  Administered 2012-03-26: 100 mL via INTRAVENOUS

## 2012-03-26 MED ORDER — HYDROMORPHONE HCL PF 1 MG/ML IJ SOLN
0.5000 mg | INTRAMUSCULAR | Status: DC | PRN
  Administered 2012-03-26 – 2012-03-27 (×3): 1 mg via INTRAVENOUS
  Filled 2012-03-26 (×3): qty 1

## 2012-03-26 MED ORDER — SODIUM CHLORIDE 0.9 % IV SOLN: INTRAVENOUS | Status: DC

## 2012-03-26 MED ORDER — INSULIN ASPART 100 UNIT/ML ~~LOC~~ SOLN
0.0000 [IU] | Freq: Every day | SUBCUTANEOUS | Status: DC
  Administered 2012-03-27: 5 [IU] via SUBCUTANEOUS

## 2012-03-26 MED ORDER — LISINOPRIL 5 MG PO TABS
5.0000 mg | ORAL_TABLET | Freq: Every day | ORAL | Status: DC
  Administered 2012-03-27 – 2012-03-28 (×2): 5 mg via ORAL
  Filled 2012-03-26 (×2): qty 1

## 2012-03-26 MED ORDER — LACTULOSE 10 GM/15ML PO SOLN
30.0000 g | ORAL | Status: AC
  Administered 2012-03-26: 40 g via ORAL
  Filled 2012-03-26: qty 60

## 2012-03-26 NOTE — ED Notes (Signed)
Family at bedside. Patient does not need anything at this time. 

## 2012-03-26 NOTE — ED Notes (Signed)
Pt c/o left flank pain since Sunday. Pt denies urinary symptoms. Also c/o nausea and dizziness. Denies vomiting or diarrhea.

## 2012-03-26 NOTE — H&P (Signed)
PCP:   Colette Ribas, MD, MD   Chief Complaint:  Left flank pain for 5 days  HPI: [name] [name] is an 71 y.o. female.   Caucasian lady with multiple medical problems including diabetes with neuropathy, hypertension, hyperlipidemia, and Nash related liver cirrhosis, dense with 5 days of excruciating left posterior flank pain radiating to the anterior abdomen. Associated with anorexia and nausea, but no vomiting. She has had cold chills but has not measured any fever. She has chronic urinary frequency, and has not noted any change in urinary frequency. She has noted no hematuria but has noticed her urine is considerably darker than usual. Because of her liver condition, she takes lactulose to maintain 4-5 loose stools per day, and this has not changed.  In the emergency room a CT scan of the abdomen and pelvis with IV contrast revealed a 3 mm left ureter total stone, left hydroureter and hydronephrosis as the only acute problems.  Rewiew of Systems:  The patient denies , fever, weight loss,, vision loss, decreased hearing, hoarseness, chest pain, syncope, dyspnea on exertion, peripheral edema, balance deficits, hemoptysis, abdominal pain, melena, hematochezia, severe indigestion/heartburn, hematuria, incontinence, genital sores, muscle weakness, suspicious skin lesions, transient blindness, difficulty walking, depression, unusual weight change, abnormal bleeding, enlarged lymph nodes, angioedema, and breast masses.    Past Medical History  Diagnosis Date  . CAD in native artery   . Thrombocytopenia, unspecified   . Unspecified hereditary and idiopathic peripheral neuropathy   . Unspecified fall   . Other and unspecified hyperlipidemia   . Unspecified essential hypertension   . Obesity, unspecified   . Shortness of breath   . Personal history of neurosis   . Type II or unspecified type diabetes mellitus without mention of complication, not stated as uncontrolled   . Intrinsic asthma,  unspecified   . Allergic rhinitis, cause unspecified   . Cirrhosis of liver   . Colon adenomas 03/08/10    tcs by Dr. Jena Gauss  . Hyperplastic polyps of stomach 03/08/10    tcs by Dr. Geni Bers  . Chronic gastritis 03/09/11    egd by Dr. Jena Gauss  . History of hemorrhoids 03/08/10    tcs- internal and external  . Diverticula of colon 03/08/10    L side  . Hiatal hernia 03/08/10  . Esophagitis, erosive 03/08/10  . GERD (gastroesophageal reflux disease)     Past Surgical History  Procedure Date  . Hysterectomy and btl     s/p  . Abdominal hysterectomy   . Tumor excision     rt arm and left foot  . Colonoscopy 03/08/10    Dr. Jena Gauss  . Esophagogastroduodenoscopy 03/08/10    Dr. Jena Gauss  . Tubal ligation   . Esophagogastroduodenoscopy 10/08/2011    Procedure: ESOPHAGOGASTRODUODENOSCOPY (EGD);  Surgeon: Corbin Ade, MD;  Location: [facility];  Service: Endoscopy;  Laterality: N/A;  10:15    Medications:  HOME MEDS: Prior to Admission medications   Medication Sig Start Date End Date Taking? Authorizing Provider  ALPRAZolam Prudy Feeler) 0.5 MG tablet Take 0.5 mg by mouth 3 (three) times daily. For anxiety   Yes Historical Provider, MD  aspirin (BAYER LOW STRENGTH) 81 MG EC tablet Take 81 mg by mouth daily.     Yes Historical Provider, MD  budesonide-formoterol (SYMBICORT) 160-4.5 MCG/ACT inhaler Inhale 2 puffs into the lungs 2 (two) times daily.     Yes Historical Provider, MD  enalapril (VASOTEC) 5 MG tablet Take 5 mg by mouth daily.  Yes Historical Provider, MD  esomeprazole (NEXIUM) 40 MG capsule Take 40 mg by mouth daily before breakfast.     Yes Historical Provider, MD  insulin aspart (NOVOLOG) 100 UNIT/ML injection Inject 10-40 Units into the skin daily as needed. Per sliding scale. Patient injects 10-40 units for levels above 150 to 300    Yes Historical Provider, MD  insulin detemir (LEVEMIR) 100 UNIT/ML injection Inject 60 Units into the skin at bedtime.    Yes Historical Provider, MD    lactulose (CHRONULAC) 10 GM/15ML solution Take 60 mLs by mouth daily.    Yes Historical Provider, MD  linagliptin (TRADJENTA) 5 MG TABS tablet Take 5 mg by mouth daily.   Yes Historical Provider, MD  meclizine (ANTIVERT) 25 MG tablet Take 25 mg by mouth 3 (three) times daily as needed. For dizziness/nausea   Yes Historical Provider, MD  Multiple Vitamins-Minerals (CENTRUM SILVER PO) Take 1 tablet by mouth daily.    Yes Historical Provider, MD  pregabalin (LYRICA) 100 MG capsule Take 100 mg by mouth daily.   Yes Historical Provider, MD  quinapril (ACCUPRIL) 5 MG tablet Take 5 mg by mouth daily.   Yes Historical Provider, MD     Allergies:  Allergies  Allergen Reactions  . Prochlorperazine Edisylate Other (See Comments)    Causes anxiety/numbness    Social History:   reports that she has never smoked. She does not have any smokeless tobacco history on file. She reports that she does not drink alcohol or use illicit drugs.  Family History: Family History  Problem Relation Age of Onset  . Coronary artery disease      FH  . Diabetes      FH  . Heart disease Mother   . Colon cancer Neg Hx      Physical Exam: Filed Vitals:   03/26/12 1431 03/26/12 1640 03/26/12 2013  BP: 137/61 136/70 121/73  Pulse: 86 76 82  Temp: 97.9 F (36.6 C)  98.3 F (36.8 C)  TempSrc: Oral  Oral  Resp: 18 18 20   Height: 5\' 8"  (1.727 m)    Weight: 110.678 kg (244 lb)    SpO2: 98% 96% 95%   Blood pressure 121/73, pulse 82, temperature 98.3 F (36.8 C), temperature source Oral, resp. rate 20, height 5\' 8"  (1.727 m), weight 110.678 kg (244 lb), SpO2 95.00%.  GEN:  Pleasant elderly obese Caucasian lady lying in the stretcher in no acute distress; cooperative with exam PSYCH:  alert and oriented x4; does not appear anxious or depressed; affect is appropriate. HEENT: Mucous membranes pink and icteric; PERRLA; EOM intact; no cervical lymphadenopathy nor thyromegaly or carotid bruit; no JVD; Breasts:: Not  examined CHEST WALL: No tenderness CHEST: Normal respiration, clear to auscultation bilaterally HEART: Regular rate and rhythm; no murmurs rubs or gallops BACK: No kyphosis or scoliosis; no CVA tenderness ABDOMEN: Obese, marked tenderness left flank; no masses, normal abdominal bowel sounds; large pannus;  intertriginous candida left abdomen. Rectal Exam: Not done EXTREMITIES: arthropathy of the hands and knees and ankles; tenderness of the area the above the left ankle medially;no edema;  Genitalia: not examined PULSES: 2+ and symmetric SKIN: Normal hydration no significant rash or ulceration CNS: Cranial nerves 2-12 grossly intact no focal lateralizing neurologic deficit; no asterixis.   Labs & Imaging Results for orders placed during the hospital encounter of 03/26/12 (from the past 48 hour(s))  CBC     Status: Abnormal   Collection Time   03/26/12  3:59 PM  Component Value Range Comment   WBC 3.6 (*) 4.0 - 10.5 (K/uL)    RBC 4.78  3.87 - 5.11 (MIL/uL)    Hemoglobin 14.1  12.0 - 15.0 (g/dL)    HCT 21.3  08.6 - 57.8 (%)    MCV 84.1  78.0 - 100.0 (fL)    MCH 29.5  26.0 - 34.0 (pg)    MCHC 35.1  30.0 - 36.0 (g/dL)    RDW 46.9  62.9 - 52.8 (%)    Platelets 129 (*) 150 - 400 (K/uL)   DIFFERENTIAL     Status: Abnormal   Collection Time   03/26/12  3:59 PM      Component Value Range Comment   Neutrophils Relative 56  43 - 77 (%)    Neutro Abs 2.0  1.7 - 7.7 (K/uL)    Lymphocytes Relative 30  12 - 46 (%)    Lymphs Abs 1.1  0.7 - 4.0 (K/uL)    Monocytes Relative 13 (*) 3 - 12 (%)    Monocytes Absolute 0.5  0.1 - 1.0 (K/uL)    Eosinophils Relative 1  0 - 5 (%)    Eosinophils Absolute 0.0  0.0 - 0.7 (K/uL)    Basophils Relative 1  0 - 1 (%)    Basophils Absolute 0.0  0.0 - 0.1 (K/uL)   COMPREHENSIVE METABOLIC PANEL     Status: Abnormal   Collection Time   03/26/12  3:59 PM      Component Value Range Comment   Sodium 140  135 - 145 (mEq/L)    Potassium 3.5  3.5 - 5.1 (mEq/L)      Chloride 108  96 - 112 (mEq/L)    CO2 20  19 - 32 (mEq/L)    Glucose, Bld 238 (*) 70 - 99 (mg/dL)    BUN 15  6 - 23 (mg/dL)    Creatinine, Ser 4.13  0.50 - 1.10 (mg/dL)    Calcium 9.1  8.4 - 10.5 (mg/dL)    Total Protein 6.1  6.0 - 8.3 (g/dL)    Albumin 3.2 (*) 3.5 - 5.2 (g/dL)    AST 28  0 - 37 (U/L)    ALT 18  0 - 35 (U/L)    Alkaline Phosphatase 73  39 - 117 (U/L)    Total Bilirubin 4.7 (*) 0.3 - 1.2 (mg/dL)    GFR calc non Af Amer 87 (*) >90 (mL/min)    GFR calc Af Amer >90  >90 (mL/min)   LIPASE, BLOOD     Status: Normal   Collection Time   03/26/12  3:59 PM      Component Value Range Comment   Lipase 25  11 - 59 (U/L)   AMMONIA     Status: Abnormal   Collection Time   03/26/12  4:00 PM      Component Value Range Comment   Ammonia 114 (*) 11 - 60 (umol/L)   URINALYSIS, ROUTINE W REFLEX MICROSCOPIC     Status: Abnormal   Collection Time   03/26/12  5:45 PM      Component Value Range Comment   Color, Urine YELLOW  YELLOW     APPearance CLEAR  CLEAR     Specific Gravity, Urine 1.015  1.005 - 1.030     pH 5.5  5.0 - 8.0     Glucose, UA 100 (*) NEGATIVE (mg/dL)    Hgb urine dipstick LARGE (*) NEGATIVE     Bilirubin Urine SMALL (*)  NEGATIVE     Ketones, ur NEGATIVE  NEGATIVE (mg/dL)    Protein, ur NEGATIVE  NEGATIVE (mg/dL)    Urobilinogen, UA 1.0  0.0 - 1.0 (mg/dL)    Nitrite NEGATIVE  NEGATIVE     Leukocytes, UA NEGATIVE  NEGATIVE    URINE MICROSCOPIC-ADD ON     Status: Abnormal   Collection Time   03/26/12  5:45 PM      Component Value Range Comment   Squamous Epithelial / LPF FEW (*) RARE     WBC, UA 0-2  <3 (WBC/hpf)    RBC / HPF 11-20  <3 (RBC/hpf)    Bacteria, UA RARE  RARE     Ct Abdomen Pelvis W Contrast  03/26/2012  *RADIOLOGY REPORT*  Clinical Data: Left flank pain, nausea, dizziness, history hypertension, diabetes, diverticulosis, cirrhosis, hiatal hernia, colonic adenomas, GERD, hysterectomy, asthma  CT ABDOMEN AND PELVIS WITH CONTRAST  Technique:   Multidetector CT imaging of the abdomen and pelvis was performed following the standard protocol during bolus administration of intravenous contrast. Sagittal and coronal MPR images reconstructed from axial data set.  Contrast: OMNIPAQUE IOHEXOL 300 MG/ML SOLN. Dilute oral contrast.  Comparison: None Correlation:  MRI abdomen 01/16/2012, 06/29/2011  Findings: Minimal atelectasis left lung base. Fat attenuation mass lesion left kidney 1.8 x 1.2 cm compatible with angiomyolipoma. Second tiny angiomyolipoma 6 mm diameter lateral mid left kidney coronal image 62. 1.5 x 1.2 cm diameter lesion centrally within the liver of intermediate attenuation, unchanged since 06/29/2011, question cyst. Second questionable lesion within the left lobe image 12 question present on prior MRI and unchanged as well. Hepatic margins are nodular consistent with cirrhosis.  Dependent calculi within gallbladder. Spleen is prominent size measuring 13.9 x 8.5 x 14.0 cm in size. Minimal dilatation of the left renal collecting system of left ureter terminating at a 3 mm distal left ureteral calculus image 75. No additional abnormalities of the liver, spleen, pancreas, kidneys, or adrenal glands. Normal appendix. Tiny umbilical hernia containing fat. Uterus surgically absent.  Probable tiny bilateral ovarian cysts, larger on left 2.2 x 1.7 cm. Sigmoid diverticulosis with slight hyperemia of the sigmoid mesocolon question mild sigmoid wall thickening, though this could be an underdistension artifact. No definite pericolic inflammatory changes identified. Cannot completely exclude subtle diverticulitis. Bladder unremarkable. Stomach and remaining bowel loops normal appearance. No additional mass, adenopathy, free fluid, or acute osseous findings.  IMPRESSION:  Cirrhotic liver with splenomegaly. Nonspecific small stable hepatic lesions. Angiomyolipomas of the left kidney. Mild left hydronephrosis and hydroureter secondary to a 3 mm diameter  distal left ureteral calculus. Cholelithiasis. Sigmoid diverticulosis with questionable subtle sigmoid wall thickening versus underdistension artifact and slight hypervascularity of the sigmoid mesocolon; unable to exclude subtle diverticulitis in this setting.  Original Report Authenticated By: Lollie Marrow, M.D.      Assessment Present on Admission:   .Renal colic .Uncontrolled type 2 diabetes mellitus with complication  .HYPERLIPIDEMIA .OBESITY .Posterior tibial tendon dysfunction  .Essential hypertension .Other chronic nonalcoholic liver disease .CIRRHOSIS .GERD .Intrinsic asthma, unspecified .CAD, NATIVE VESSEL .PERIPHERAL NEUROPATHY .THROMBOCYTOPENIA, CHRONIC   PLAN: Admit patient for pain control and hydration; Consult urology; Continue management of chronic medical conditions  Other plans as per orders.    Miah Boye 03/26/2012, 8:22 PM

## 2012-03-26 NOTE — ED Provider Notes (Signed)
History   This chart was scribed for [name] Booze, MD scribed by Magnus Sinning. The patient was seen in room [facility]11/[facility]11 seen at 15:31    CSN: [id number]  Arrival date & time 03/26/12  1422   First MD Initiated Contact with Patient 03/26/12 1517      Chief Complaint  Patient presents with  . Flank Pain    (Consider location/radiation/quality/duration/timing/severity/associated sxs/prior treatment) HPI AWA BACHICHA is a 71 y.o. female who presents to the Emergency Department complaining of constant moderate sharp left flank pain with associated nausea, onset 5 days ago. Patient rates the pain a 8/10 currently and a 9/10 at its worse. She explains that she is unable to lay on her right side. Denies fever, chills, diaphoresis, vomiting, and diarrhea. Pt says that she does not smoke, but does has h/o cirrhosis of the liver. PCP: Dr. Phillips Odor Past Medical History  Diagnosis Date  . CAD in native artery   . Thrombocytopenia, unspecified   . Unspecified hereditary and idiopathic peripheral neuropathy   . Unspecified fall   . Other and unspecified hyperlipidemia   . Unspecified essential hypertension   . Obesity, unspecified   . Shortness of breath   . Personal history of neurosis   . Type II or unspecified type diabetes mellitus without mention of complication, not stated as uncontrolled   . Intrinsic asthma, unspecified   . Allergic rhinitis, cause unspecified   . Cirrhosis of liver   . Colon adenomas 03/08/10    tcs by Dr. Jena Gauss  . Hyperplastic polyps of stomach 03/08/10    tcs by Dr. Geni Bers  . Chronic gastritis 03/09/11    egd by Dr. Jena Gauss  . History of hemorrhoids 03/08/10    tcs- internal and external  . Diverticula of colon 03/08/10    L side  . Hiatal hernia 03/08/10  . Esophagitis, erosive 03/08/10  . GERD (gastroesophageal reflux disease)     Past Surgical History  Procedure Date  . Hysterectomy and btl     s/p  . Abdominal hysterectomy   . Tumor excision     rt arm  and left foot  . Colonoscopy 03/08/10    Dr. Jena Gauss  . Esophagogastroduodenoscopy 03/08/10    Dr. Jena Gauss  . Tubal ligation   . Esophagogastroduodenoscopy 10/08/2011    Procedure: ESOPHAGOGASTRODUODENOSCOPY (EGD);  Surgeon: Corbin Ade, MD;  Location: [facility];  Service: Endoscopy;  Laterality: N/A;  10:15    Family History  Problem Relation Age of Onset  . Coronary artery disease      FH  . Diabetes      FH  . Heart disease Mother   . Colon cancer Neg Hx     History  Substance Use Topics  . Smoking status: Never Smoker   . Smokeless tobacco: Not on file  . Alcohol Use: No   Review of Systems  All other systems reviewed and are negative.   10 Systems reviewed and are negative for acute change except as noted in the HPI. Allergies  Prochlorperazine edisylate  Home Medications   Current Outpatient Rx  Name Route Sig Dispense Refill  . ALPRAZOLAM 0.5 MG PO TABS Oral Take 0.5 mg by mouth every 8 (eight) hours as needed. For anxiety    . ASPIRIN 81 MG PO TBEC Oral Take 81 mg by mouth daily.      . BUDESONIDE-FORMOTEROL FUMARATE 160-4.5 MCG/ACT IN AERO Inhalation Inhale 2 puffs into the lungs 2 (two) times daily.      Marland Kitchen  ENALAPRIL MALEATE 5 MG PO TABS Oral Take 5 mg by mouth daily.      Marland Kitchen ESOMEPRAZOLE MAGNESIUM 40 MG PO CPDR Oral Take 40 mg by mouth daily before breakfast.      . INSULIN ASPART 100 UNIT/ML Okay SOLN Subcutaneous Inject 10-40 Units into the skin daily as needed. Per sliding scale. Patient injects 10-40 units for levels above 150 to 300     . INSULIN DETEMIR 100 UNIT/ML  SOLN Subcutaneous Inject 50 Units into the skin 3 (three) times daily.      Marland Kitchen LACTULOSE 10 GM/15ML PO SOLN Oral Take 60 mLs by mouth daily.     Marland Kitchen MECLIZINE HCL 25 MG PO TABS Oral Take 25 mg by mouth 3 (three) times daily as needed. For dizziness/nausea    . METOPROLOL TARTRATE 50 MG PO TABS Oral Take 50 mg by mouth daily at 6 (six) AM.      . CENTRUM SILVER PO Oral Take 1 tablet by mouth  daily.     Marland Kitchen PREGABALIN 150 MG PO CAPS Oral Take 150 mg by mouth daily.     . TRAMADOL HCL 50 MG PO TABS Oral Take 50 mg by mouth every 6 (six) hours as needed. For pain    . VITAMIN D (CHOLECALCIFEROL) PO Oral Take 1 tablet by mouth daily.       BP 137/61  Pulse 86  Temp(Src) 97.9 F (36.6 C) (Oral)  Resp 18  Ht 5\' 8"  (1.727 m)  Wt 244 lb (110.678 kg)  BMI 37.10 kg/m2  SpO2 98%  Physical Exam  Nursing note and vitals reviewed. Constitutional: She is oriented to person, place, and time. She appears well-developed and well-nourished. No distress.  HENT:  Head: Normocephalic and atraumatic.  Eyes: EOM are normal. Pupils are equal, round, and reactive to light.  Neck: Neck supple. No tracheal deviation present.  Cardiovascular: Normal rate.   Pulmonary/Chest: Effort normal. No respiratory distress.  Abdominal: Soft. She exhibits no distension. There is tenderness.       Mild LUQ tenderness and mild left CVA tenderness.   Musculoskeletal: Normal range of motion. She exhibits no edema.  Neurological: She is alert and oriented to person, place, and time. No sensory deficit.  Skin: Skin is warm and dry.  Psychiatric: She has a normal mood and affect. Her behavior is normal.    ED Course  Procedures (including critical care time) DIAGNOSTIC STUDIES: Oxygen Saturation is 98% on room air, normal by my interpretation.    COORDINATION OF CARE: Medication Orders 1545: DILAUDID injection 1 mg Once              ZOFRAN injection 4 mg Once 1700:  DILAUDID injection 1 mg Once  17:27:OMNIPAQUE 300 MG/ML solution 100 mL  1815: CHRONULAC 10 GM/15ML solution 30 g   18:12: Physician informs patient that she has a small kidney stone and that her ammonium level is up, similar to the two times she was previously admitted. Physician also recommends pt be admitted to be kept for further monitoring and evaluation. Patient reports feeling better at this time and agrees with the plan of action.    18:22: Consult complete with Dr. Lendell Caprice, internist. Patient case explained and discussed. Dr. Lendell Caprice agrees to admit patient for further evaluation and treatment.   Results for orders placed during the hospital encounter of 03/26/12  URINALYSIS, ROUTINE W REFLEX MICROSCOPIC      Component Value Range   Color, Urine YELLOW  YELLOW  APPearance CLEAR  CLEAR    Specific Gravity, Urine 1.015  1.005 - 1.030    pH 5.5  5.0 - 8.0    Glucose, UA 100 (*) NEGATIVE (mg/dL)   Hgb urine dipstick LARGE (*) NEGATIVE    Bilirubin Urine SMALL (*) NEGATIVE    Ketones, ur NEGATIVE  NEGATIVE (mg/dL)   Protein, ur NEGATIVE  NEGATIVE (mg/dL)   Urobilinogen, UA 1.0  0.0 - 1.0 (mg/dL)   Nitrite NEGATIVE  NEGATIVE    Leukocytes, UA NEGATIVE  NEGATIVE   CBC      Component Value Range   WBC 3.6 (*) 4.0 - 10.5 (K/uL)   RBC 4.78  3.87 - 5.11 (MIL/uL)   Hemoglobin 14.1  12.0 - 15.0 (g/dL)   HCT 16.1  09.6 - 04.5 (%)   MCV 84.1  78.0 - 100.0 (fL)   MCH 29.5  26.0 - 34.0 (pg)   MCHC 35.1  30.0 - 36.0 (g/dL)   RDW 40.9  81.1 - 91.4 (%)   Platelets 129 (*) 150 - 400 (K/uL)  DIFFERENTIAL      Component Value Range   Neutrophils Relative 56  43 - 77 (%)   Neutro Abs 2.0  1.7 - 7.7 (K/uL)   Lymphocytes Relative 30  12 - 46 (%)   Lymphs Abs 1.1  0.7 - 4.0 (K/uL)   Monocytes Relative 13 (*) 3 - 12 (%)   Monocytes Absolute 0.5  0.1 - 1.0 (K/uL)   Eosinophils Relative 1  0 - 5 (%)   Eosinophils Absolute 0.0  0.0 - 0.7 (K/uL)   Basophils Relative 1  0 - 1 (%)   Basophils Absolute 0.0  0.0 - 0.1 (K/uL)  COMPREHENSIVE METABOLIC PANEL      Component Value Range   Sodium 140  135 - 145 (mEq/L)   Potassium 3.5  3.5 - 5.1 (mEq/L)   Chloride 108  96 - 112 (mEq/L)   CO2 20  19 - 32 (mEq/L)   Glucose, Bld 238 (*) 70 - 99 (mg/dL)   BUN 15  6 - 23 (mg/dL)   Creatinine, Ser 7.82  0.50 - 1.10 (mg/dL)   Calcium 9.1  8.4 - 95.6 (mg/dL)   Total Protein 6.1  6.0 - 8.3 (g/dL)   Albumin 3.2 (*) 3.5 - 5.2 (g/dL)   AST  28  0 - 37 (U/L)   ALT 18  0 - 35 (U/L)   Alkaline Phosphatase 73  39 - 117 (U/L)   Total Bilirubin 4.7 (*) 0.3 - 1.2 (mg/dL)   GFR calc non Af Amer 87 (*) >90 (mL/min)   GFR calc Af Amer >90  >90 (mL/min)  LIPASE, BLOOD      Component Value Range   Lipase 25  11 - 59 (U/L)  AMMONIA      Component Value Range   Ammonia 114 (*) 11 - 60 (umol/L)  URINE MICROSCOPIC-ADD ON      Component Value Range   Squamous Epithelial / LPF FEW (*) RARE    WBC, UA 0-2  <3 (WBC/hpf)   RBC / HPF 11-20  <3 (RBC/hpf)   Bacteria, UA RARE  RARE    Ct Abdomen Pelvis W Contrast  03/26/2012  *RADIOLOGY REPORT*  Clinical Data: Left flank pain, nausea, dizziness, history hypertension, diabetes, diverticulosis, cirrhosis, hiatal hernia, colonic adenomas, GERD, hysterectomy, asthma  CT ABDOMEN AND PELVIS WITH CONTRAST  Technique:  Multidetector CT imaging of the abdomen and pelvis was performed following the standard protocol during  bolus administration of intravenous contrast. Sagittal and coronal MPR images reconstructed from axial data set.  Contrast: OMNIPAQUE IOHEXOL 300 MG/ML SOLN. Dilute oral contrast.  Comparison: None Correlation:  MRI abdomen 01/16/2012, 06/29/2011  Findings: Minimal atelectasis left lung base. Fat attenuation mass lesion left kidney 1.8 x 1.2 cm compatible with angiomyolipoma. Second tiny angiomyolipoma 6 mm diameter lateral mid left kidney coronal image 62. 1.5 x 1.2 cm diameter lesion centrally within the liver of intermediate attenuation, unchanged since 06/29/2011, question cyst. Second questionable lesion within the left lobe image 12 question present on prior MRI and unchanged as well. Hepatic margins are nodular consistent with cirrhosis.  Dependent calculi within gallbladder. Spleen is prominent size measuring 13.9 x 8.5 x 14.0 cm in size. Minimal dilatation of the left renal collecting system of left ureter terminating at a 3 mm distal left ureteral calculus image 75. No additional  abnormalities of the liver, spleen, pancreas, kidneys, or adrenal glands. Normal appendix. Tiny umbilical hernia containing fat. Uterus surgically absent.  Probable tiny bilateral ovarian cysts, larger on left 2.2 x 1.7 cm. Sigmoid diverticulosis with slight hyperemia of the sigmoid mesocolon question mild sigmoid wall thickening, though this could be an underdistension artifact. No definite pericolic inflammatory changes identified. Cannot completely exclude subtle diverticulitis. Bladder unremarkable. Stomach and remaining bowel loops normal appearance. No additional mass, adenopathy, free fluid, or acute osseous findings.  IMPRESSION:  Cirrhotic liver with splenomegaly. Nonspecific small stable hepatic lesions. Angiomyolipomas of the left kidney. Mild left hydronephrosis and hydroureter secondary to a 3 mm diameter distal left ureteral calculus. Cholelithiasis. Sigmoid diverticulosis with questionable subtle sigmoid wall thickening versus underdistension artifact and slight hypervascularity of the sigmoid mesocolon; unable to exclude subtle diverticulitis in this setting.  Original Report Authenticated By: Lollie Marrow, M.D.       1. Hepatic encephalopathy   2. Ureteral calculus, left       MDM  Left flank pain. Old records of been reviewed and she had an MRI of the abdomen which shows that she has a fatty tumor on the left kidney. I am concerned that she may have hemorrhaged into the tumor. CT scan will be obtained. Also, since the patient states that she is noted that she has been confused and this happens when she has an elevated ammonia level, ammonia level will be checked.  CT scan shows ureterolithiasis with hydronephrosis which accounts for her pain. She got good pain relief with IV hydromorphone. I'm concerned that giving her narcotics may worsen the symptoms of hepatic encephalopathy. Therefore, she will be admitted to and get those problems are treated where she could be observed. She is  given initial dose of lactulose. Case has been discussed with Dr. Lendell Caprice of [facility] who agrees to admit the patient.   I personally performed the services described in this documentation, which was scribed in my presence. The recorded information has been reviewed and considered.           [name] Booze, MD 03/26/12 [phone]

## 2012-03-27 DIAGNOSIS — N23 Unspecified renal colic: Secondary | ICD-10-CM

## 2012-03-27 DIAGNOSIS — K7460 Unspecified cirrhosis of liver: Secondary | ICD-10-CM

## 2012-03-27 DIAGNOSIS — E1165 Type 2 diabetes mellitus with hyperglycemia: Secondary | ICD-10-CM

## 2012-03-27 DIAGNOSIS — IMO0001 Reserved for inherently not codable concepts without codable children: Secondary | ICD-10-CM

## 2012-03-27 LAB — COMPREHENSIVE METABOLIC PANEL
ALT: 16 U/L (ref 0–35)
AST: 24 U/L (ref 0–37)
Alkaline Phosphatase: 66 U/L (ref 39–117)
Calcium: 8.5 mg/dL (ref 8.4–10.5)
Glucose, Bld: 158 mg/dL — ABNORMAL HIGH (ref 70–99)
Potassium: 3.3 mEq/L — ABNORMAL LOW (ref 3.5–5.1)
Sodium: 139 mEq/L (ref 135–145)
Total Protein: 5.1 g/dL — ABNORMAL LOW (ref 6.0–8.3)

## 2012-03-27 LAB — GLUCOSE, CAPILLARY: Glucose-Capillary: 167 mg/dL — ABNORMAL HIGH (ref 70–99)

## 2012-03-27 LAB — MAGNESIUM: Magnesium: 1.6 mg/dL (ref 1.5–2.5)

## 2012-03-27 LAB — CBC
Hemoglobin: 13 g/dL (ref 12.0–15.0)
MCH: 30 pg (ref 26.0–34.0)
MCHC: 35.4 g/dL (ref 30.0–36.0)
Platelets: 125 10*3/uL — ABNORMAL LOW (ref 150–400)
RBC: 4.34 MIL/uL (ref 3.87–5.11)

## 2012-03-27 MED ORDER — TAMSULOSIN HCL 0.4 MG PO CAPS
0.4000 mg | ORAL_CAPSULE | Freq: Every day | ORAL | Status: DC
  Administered 2012-03-27 – 2012-03-28 (×2): 0.4 mg via ORAL
  Filled 2012-03-27 (×2): qty 1

## 2012-03-27 MED ORDER — POTASSIUM CHLORIDE CRYS ER 20 MEQ PO TBCR
40.0000 meq | EXTENDED_RELEASE_TABLET | Freq: Once | ORAL | Status: AC
  Administered 2012-03-27: 40 meq via ORAL
  Filled 2012-03-27: qty 2

## 2012-03-27 NOTE — Consult Note (Signed)
Dictation [id number]

## 2012-03-27 NOTE — Progress Notes (Signed)
UR Chart Review Completed  

## 2012-03-27 NOTE — Progress Notes (Signed)
Subjective: This lady was admitted with classic symptoms of left-sided ureteric colic. Indeed she does have a distal ureter 3 mm stone. She does not have any nausea vomiting. She is receiving analgesics. 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 She is diabetic.           Physical Exam: Blood pressure 118/76, pulse 72, temperature 98.3 F (36.8 C), temperature source Oral, resp. rate 20, height 5\' 8"  (1.727 m), weight 113.3 kg (249 lb 12.5 oz), SpO2 95.00%. She does look systemically well at the present time. She is obese. Abdomen is soft and nontender. The left loin is particularly not tender. Heart sounds are present and normal. Lung fields are clear. She is alert and orientated. There is no evidence of hepatic encephalopathy.   Investigations:     Basic Metabolic Panel:  Basename 03/27/12 0535 03/26/12 1559  NA 139 140  K 3.3* 3.5  CL 106 108  CO2 22 20  GLUCOSE 158* 238*  BUN 13 15  CREATININE 0.65 0.69  CALCIUM 8.5 9.1  MG 1.6 --  PHOS -- --   Liver Function Tests:  [facility] 03/27/12 0535 03/26/12 1559  AST 24 28  ALT 16 18  ALKPHOS 66 73  BILITOT 4.3* 4.7*  PROT 5.1* 6.1  ALBUMIN 2.9* 3.2*     CBC:  Basename 03/27/12 0535 03/26/12 1559  WBC 4.9 3.6*  NEUTROABS -- 2.0  HGB 13.0 14.1  HCT 36.7 40.2  MCV 84.6 84.1  PLT 125* 129*    Ct Abdomen Pelvis W Contrast  03/26/2012  *RADIOLOGY REPORT*  Clinical Data: Left flank pain, nausea, dizziness, history hypertension, diabetes, diverticulosis, cirrhosis, hiatal hernia, colonic adenomas, GERD, hysterectomy, asthma  CT ABDOMEN AND PELVIS WITH CONTRAST  Technique:  Multidetector CT imaging of the abdomen and pelvis was performed following the standard protocol during bolus administration of intravenous contrast. Sagittal and coronal MPR images reconstructed from axial data set.  Contrast: OMNIPAQUE IOHEXOL 300 MG/ML SOLN. Dilute oral contrast.  Comparison: None Correlation:  MRI  abdomen 01/16/2012, 06/29/2011  Findings: Minimal atelectasis left lung base. Fat attenuation mass lesion left kidney 1.8 x 1.2 cm compatible with angiomyolipoma. Second tiny angiomyolipoma 6 mm diameter lateral mid left kidney coronal image 62. 1.5 x 1.2 cm diameter lesion centrally within the liver of intermediate attenuation, unchanged since 06/29/2011, question cyst. Second questionable lesion within the left lobe image 12 question present on prior MRI and unchanged as well. Hepatic margins are nodular consistent with cirrhosis.  Dependent calculi within gallbladder. Spleen is prominent size measuring 13.9 x 8.5 x 14.0 cm in size. Minimal dilatation of the left renal collecting system of left ureter terminating at a 3 mm distal left ureteral calculus image 75. No additional abnormalities of the liver, spleen, pancreas, kidneys, or adrenal glands. Normal appendix. Tiny umbilical hernia containing fat. Uterus surgically absent.  Probable tiny bilateral ovarian cysts, larger on left 2.2 x 1.7 cm. Sigmoid diverticulosis with slight hyperemia of the sigmoid mesocolon question mild sigmoid wall thickening, though this could be an underdistension artifact. No definite pericolic inflammatory changes identified. Cannot completely exclude subtle diverticulitis. Bladder unremarkable. Stomach and remaining bowel loops normal appearance. No additional mass, adenopathy, free fluid, or acute osseous findings.  IMPRESSION:  Cirrhotic liver with splenomegaly. Nonspecific small stable hepatic lesions. Angiomyolipomas of the left kidney. Mild left hydronephrosis and hydroureter secondary to a 3 mm diameter distal left ureteral calculus. Cholelithiasis. Sigmoid diverticulosis with questionable subtle sigmoid wall thickening  versus underdistension artifact and slight hypervascularity of the sigmoid mesocolon; unable to exclude subtle diverticulitis in this setting.  Original Report Authenticated By: Lollie Marrow, M.D.       Medications:  Scheduled:   . aspirin EC  81 mg Oral Daily  . budesonide-formoterol  2 puff Inhalation BID  . clotrimazole   Topical BID  . enoxaparin  40 mg Subcutaneous Q24H  . HYDROmorphone      . HYDROmorphone  1 mg Intravenous Once  . HYDROmorphone  1 mg Intravenous Once  . insulin aspart  0-15 Units Subcutaneous TID WC  . insulin aspart  0-5 Units Subcutaneous QHS  . insulin detemir  50 Units Subcutaneous QHS  . lactulose  30 g Oral STAT  . lactulose  40 g Oral BID  . lisinopril  5 mg Oral Daily  . ondansetron  4 mg Intravenous Once  . pantoprazole  80 mg Oral Q1200  . potassium chloride  40 mEq Oral Once  . pregabalin  100 mg Oral Daily  . DISCONTD: sodium chloride   Intravenous STAT  . DISCONTD: insulin detemir  60 Units Subcutaneous QHS    Impression: 1. Left ureteric colic. 2. Cirrhosis of the liver. 3. Type 2 diabetes mellitus. 4. Obesity.     Plan: 1. Continue with conservative management, pain relief. 2. Urology consultation today. Hopefully she will pass this stone spontaneously.     LOS: 1 day   Wilson Singer Pager [phone]  03/27/2012, 8:37 AM

## 2012-03-28 DIAGNOSIS — K7460 Unspecified cirrhosis of liver: Secondary | ICD-10-CM

## 2012-03-28 DIAGNOSIS — E1165 Type 2 diabetes mellitus with hyperglycemia: Secondary | ICD-10-CM

## 2012-03-28 DIAGNOSIS — N23 Unspecified renal colic: Secondary | ICD-10-CM

## 2012-03-28 DIAGNOSIS — IMO0001 Reserved for inherently not codable concepts without codable children: Secondary | ICD-10-CM

## 2012-03-28 LAB — CBC
MCH: 30.3 pg (ref 26.0–34.0)
MCV: 85.5 fL (ref 78.0–100.0)
Platelets: 97 10*3/uL — ABNORMAL LOW (ref 150–400)
RDW: 14.8 % (ref 11.5–15.5)
WBC: 3.3 10*3/uL — ABNORMAL LOW (ref 4.0–10.5)

## 2012-03-28 LAB — COMPREHENSIVE METABOLIC PANEL
AST: 21 U/L (ref 0–37)
Albumin: 2.8 g/dL — ABNORMAL LOW (ref 3.5–5.2)
Calcium: 8.4 mg/dL (ref 8.4–10.5)
Creatinine, Ser: 0.61 mg/dL (ref 0.50–1.10)
Total Protein: 4.8 g/dL — ABNORMAL LOW (ref 6.0–8.3)

## 2012-03-28 LAB — GLUCOSE, CAPILLARY: Glucose-Capillary: 152 mg/dL — ABNORMAL HIGH (ref 70–99)

## 2012-03-28 MED ORDER — OXYCODONE HCL 5 MG PO TABS: 5.0000 mg | ORAL_TABLET | ORAL | Status: AC | PRN

## 2012-03-28 MED ORDER — CLOTRIMAZOLE 1 % EX CREA: TOPICAL_CREAM | Freq: Two times a day (BID) | CUTANEOUS | Status: AC

## 2012-03-28 NOTE — Progress Notes (Signed)
Patient received discharge instructions along with follow up appointments. Patient verbalized understanding of all instructions. Patient was escorted by staff via wheelchair to vehicle. Patient discharged to home in stable condition. 

## 2012-03-28 NOTE — Care Management Note (Signed)
    Page 1 of 1   03/28/2012     9:31:21 AM   CARE MANAGEMENT NOTE 03/28/2012  Patient:  [name] [name], [name] [name]   Account Number:  [id number]  Date Initiated:  03/28/2012  Documentation initiated by:  Rosemary Holms  Subjective/Objective Assessment:   Pt admitted from home where he lives with spouse. C/o abdominal pain which was diagnosed as a kidney stone.     Action/Plan:   Pt to discharge home to follow as outpatient. No HH needs identified   Anticipated DC Date:  03/28/2012   Anticipated DC Plan:  HOME/SELF CARE      DC Planning Services  CM consult      Choice offered to / List presented to:             Status of service:  In process, will continue to follow Medicare Important Message given?   (If response is "NO", the following Medicare IM given date fields will be blank) Date Medicare IM given:   Date Additional Medicare IM given:    Discharge Disposition:  HOME/SELF CARE  Per UR Regulation:    If discussed at Long Length of Stay Meetings, dates discussed:    Comments:  03/28/12 920 Airelle Everding RN BSN CM

## 2012-03-28 NOTE — Consult Note (Signed)
NAMETAMORA, HUNEKE                ACCOUNT NO.:  [id number]  MEDICAL RECORD NO.:  [id number]  LOCATION:  A304                          FACILITY:  [facility]  PHYSICIAN:  Ky Barban, M.D.DATE OF BIRTH:  [birth year]  DATE OF CONSULTATION: DATE OF DISCHARGE:                                CONSULTATION   CHIEF COMPLAINT:  Recurrent left renal colic.  HISTORY:  A 71 year old female for several days having pain in her left flank associated with nausea, no vomiting, fever, or chills.  Came to the emergency room where a CT scan showed that she has distal left ureteral calculus, 3 mm in size causing minimum obstruction.  She had a CT scan done with contrast.  She has two small angio lipomas in left kidney.  Minimum dilatation of left renal collecting system, left ureter, terminating at 3 mm distal left ureter.  No other stones were seen in the urinary tract.  She is getting IV fluids, parenteral analgesia.  When the pain goes away, she can be discharged home.  It is a 3 mm stone, good chance if she will pass it.  If she goes home, then I can follow her in the office in 2 weeks.  I appreciate as far as angiomyolipoma that small benign tumor they can be left alone.  Her past medical history includes coronary artery disease, thrombocytopenia, idiopathic peripheral neuropathy, essential hypertension, obesity, type 2 diabetes and cirrhosis of liver, colon adenoma.  PAST SURGICAL HISTORY:  Include hysterectomy, bilateral salpingo oophorectomy.  Benign tumor removed, right from on left foot.  PERSONAL HISTORY:  She never smoked.  Does not take any drugs.  REVIEW OF SYSTEMS:  Unremarkable.  PHYSICAL EXAMINATION:  VITAL SIGNS:  Blood pressure 121/73, temperature is normal. GENERAL:  Moderately obese female, not in acute distress.  ABDOMEN: Soft, flat.  Liver, spleen, kidney is not palpable.  No CVA tenderness. PELVIC:  Deferred.  LABORATORY DATA:  WBC count is 3.6, hematocrit 40.2.  Sodium  140, potassium 3.5, chloride 108, CO2 is 20, BUN 15, creatinine 0.69. Urinalysis shows 11-20 rbc's.  IMPRESSION:  As I mentioned above, distal right ureteral calculus, 3 mm good chance she will pass it.  If she continues to have pain, she may need stone basket.  We will follow.  Thank you very much.     Ky Barban, M.D.     MIJ/MEDQ  D:  03/27/2012  T:  03/28/2012  Job:  [id number]

## 2012-03-28 NOTE — Discharge Summary (Signed)
Physician Discharge Summary  Patient ID: [name] [name] MRN: [id number] DOB/AGE: 1941/06/02 71 y.o. Primary Care Physician:GOLDING,JOHN CABOT, MD, MD Admit date: 03/26/2012 Discharge date: 03/28/2012    Discharge Diagnoses:  1. Left ureteric colic, resolved. 3 mm distal ureter stone on the left side. 2. Cirrhosis secondary to NASH. 3. Hypertension. 4. Type 2 diabetes mellitus. 5. Obesity.   Medication List  As of 03/28/2012  8:31 AM   STOP taking these medications         quinapril 5 MG tablet         TAKE these medications         ALPRAZolam 0.5 MG tablet   Commonly known as: XANAX   Take 0.5 mg by mouth 3 (three) times daily. For anxiety      BAYER LOW STRENGTH 81 MG EC tablet   Generic drug: aspirin   Take 81 mg by mouth daily.      budesonide-formoterol 160-4.5 MCG/ACT inhaler   Commonly known as: SYMBICORT   Inhale 2 puffs into the lungs 2 (two) times daily.      CENTRUM SILVER PO   Take 1 tablet by mouth daily.      clotrimazole 1 % cream   Commonly known as: LOTRIMIN   Apply topically 2 (two) times daily.      enalapril 5 MG tablet   Commonly known as: VASOTEC   Take 5 mg by mouth daily.      esomeprazole 40 MG capsule   Commonly known as: NEXIUM   Take 40 mg by mouth daily before breakfast.      insulin aspart 100 UNIT/ML injection   Commonly known as: novoLOG   Inject 10-40 Units into the skin daily as needed. Per sliding scale. Patient injects 10-40 units for levels above 150 to 300      insulin detemir 100 UNIT/ML injection   Commonly known as: LEVEMIR   Inject 60 Units into the skin at bedtime.      lactulose 10 GM/15ML solution   Commonly known as: CHRONULAC   Take 60 mLs by mouth daily.      meclizine 25 MG tablet   Commonly known as: ANTIVERT   Take 25 mg by mouth 3 (three) times daily as needed. For dizziness/nausea      oxyCODONE 5 MG immediate release tablet   Commonly known as: Oxy IR/ROXICODONE   Take 1 tablet (5 mg total) by  mouth every 4 (four) hours as needed.      pregabalin 100 MG capsule   Commonly known as: LYRICA   Take 100 mg by mouth daily.      TRADJENTA 5 MG Tabs tablet   Generic drug: linagliptin   Take 5 mg by mouth daily.            Discharged Condition: Stable and improved.    Consults: Urology, Dr. Jerre Simon.  Significant Diagnostic Studies: Ct Abdomen Pelvis W Contrast  03/26/2012  *RADIOLOGY REPORT*  Clinical Data: Left flank pain, nausea, dizziness, history hypertension, diabetes, diverticulosis, cirrhosis, hiatal hernia, colonic adenomas, GERD, hysterectomy, asthma  CT ABDOMEN AND PELVIS WITH CONTRAST  Technique:  Multidetector CT imaging of the abdomen and pelvis was performed following the standard protocol during bolus administration of intravenous contrast. Sagittal and coronal MPR images reconstructed from axial data set.  Contrast: OMNIPAQUE IOHEXOL 300 MG/ML SOLN. Dilute oral contrast.  Comparison: None Correlation:  MRI abdomen 01/16/2012, 06/29/2011  Findings: Minimal atelectasis left lung base. Fat attenuation mass lesion left  kidney 1.8 x 1.2 cm compatible with angiomyolipoma. Second tiny angiomyolipoma 6 mm diameter lateral mid left kidney coronal image 62. 1.5 x 1.2 cm diameter lesion centrally within the liver of intermediate attenuation, unchanged since 06/29/2011, question cyst. Second questionable lesion within the left lobe image 12 question present on prior MRI and unchanged as well. Hepatic margins are nodular consistent with cirrhosis.  Dependent calculi within gallbladder. Spleen is prominent size measuring 13.9 x 8.5 x 14.0 cm in size. Minimal dilatation of the left renal collecting system of left ureter terminating at a 3 mm distal left ureteral calculus image 75. No additional abnormalities of the liver, spleen, pancreas, kidneys, or adrenal glands. Normal appendix. Tiny umbilical hernia containing fat. Uterus surgically absent.  Probable tiny bilateral ovarian  cysts, larger on left 2.2 x 1.7 cm. Sigmoid diverticulosis with slight hyperemia of the sigmoid mesocolon question mild sigmoid wall thickening, though this could be an underdistension artifact. No definite pericolic inflammatory changes identified. Cannot completely exclude subtle diverticulitis. Bladder unremarkable. Stomach and remaining bowel loops normal appearance. No additional mass, adenopathy, free fluid, or acute osseous findings.  IMPRESSION:  Cirrhotic liver with splenomegaly. Nonspecific small stable hepatic lesions. Angiomyolipomas of the left kidney. Mild left hydronephrosis and hydroureter secondary to a 3 mm diameter distal left ureteral calculus. Cholelithiasis. Sigmoid diverticulosis with questionable subtle sigmoid wall thickening versus underdistension artifact and slight hypervascularity of the sigmoid mesocolon; unable to exclude subtle diverticulitis in this setting.  Original Report Authenticated By: Lollie Marrow, M.D.    Lab Results: Basic Metabolic Panel:  Basename 03/28/12 0543 03/27/12 0535  NA 142 139  K 3.6 3.3*  CL 109 106  CO2 23 22  GLUCOSE 191* 158*  BUN 12 13  CREATININE 0.61 0.65  CALCIUM 8.4 8.5  MG -- 1.6  PHOS -- --   Liver Function Tests:  [facility] 03/28/12 0543 03/27/12 0535  AST 21 24  ALT 14 16  ALKPHOS 62 66  BILITOT 1.8* 4.3*  PROT 4.8* 5.1*  ALBUMIN 2.8* 2.9*     CBC:  Basename 03/28/12 0543 03/27/12 0535 03/26/12 1559  WBC 3.3* 4.9 --  NEUTROABS -- -- 2.0  HGB 11.5* 13.0 --  HCT 32.4* 36.7 --  MCV 85.5 84.6 --  PLT 97* 125* --       Hospital Course: This very pleasant 70 year old lady was admitted with symptoms of left flank pain for 5 days. This was associated with nausea and anorexia but no vomiting. She noticed her urine was darker than usual. When she was seen in the emergency room, a CT scan of abdomen and pelvis with IV contrast revealed a 3 mm left ureter distal stone, left hydroureter and hydronephrosis. She was  therefore admitted, given IV fluids and analgesics. She was seen by Dr. Jerre Simon, urology, who felt that she would probably pass the stone. Indeed, this morning she feels much better and her pain is resolved. She cannot remember if she passed a stone or visualized it in the urine. I suspect she has in fact passed a stone.  Discharge Exam: Blood pressure 108/68, pulse 86, temperature 98 F (36.7 C), temperature source Oral, resp. rate 20, height 5\' 8"  (1.727 m), weight 114.5 kg (252 lb 6.8 oz), SpO2 94.00%. She looks systemically well. Heart sounds are present and normal. Lung fields are clear. Abdomen is soft nontender. She is alert and orientated without any focal neurological signs.  Disposition: Home. She will followup with Dr. Jerre Simon in the next couple weeks.  Discharge Orders    Future Appointments: Provider: Department: Dept Phone: Center:   04/08/2012 9:00 AM Tiffany Kocher, PA [facility] [phone] [facility]     Future Orders Please Complete By Expires   Diet - low sodium heart healthy      Increase activity slowly         Follow-up Information    Follow up with Alleen Borne I, MD in 2 weeks. (If symptoms worsen if symptoms worsen)    Contact information:   [address] [place] [address] [phone]          Signed: Wilson Singer Pager [phone]  03/28/2012, 8:31 AM

## 2012-03-28 NOTE — Progress Notes (Signed)
Discharge summary sent to payer through [facility]  

## 2012-04-04 ENCOUNTER — Other Ambulatory Visit (HOSPITAL_COMMUNITY): Payer: Self-pay | Admitting: Family Medicine

## 2012-04-04 ENCOUNTER — Encounter: Payer: Self-pay | Admitting: Internal Medicine

## 2012-04-04 DIAGNOSIS — Z139 Encounter for screening, unspecified: Secondary | ICD-10-CM

## 2012-04-08 ENCOUNTER — Encounter: Payer: Self-pay | Admitting: Gastroenterology

## 2012-04-08 ENCOUNTER — Ambulatory Visit: Payer: Medicare Other | Admitting: Gastroenterology

## 2012-04-08 ENCOUNTER — Ambulatory Visit (INDEPENDENT_AMBULATORY_CARE_PROVIDER_SITE_OTHER): Payer: Medicare Other | Admitting: Gastroenterology

## 2012-04-08 VITALS — BP 117/74 | HR 90 | Temp 98.1°F | Ht 68.0 in | Wt 256.6 lb

## 2012-04-08 DIAGNOSIS — D649 Anemia, unspecified: Secondary | ICD-10-CM | POA: Insufficient documentation

## 2012-04-08 DIAGNOSIS — G9349 Other encephalopathy: Secondary | ICD-10-CM

## 2012-04-08 DIAGNOSIS — K7460 Unspecified cirrhosis of liver: Secondary | ICD-10-CM

## 2012-04-08 NOTE — Patient Instructions (Signed)
Please have your blood work done. Please split your current lactulose dose into two doses, one in morning and one after lunch.

## 2012-04-08 NOTE — Progress Notes (Signed)
Primary Care Physician: Colette Ribas, MD, MD  Primary Gastroenterologist:  Roetta Sessions, MD   Chief Complaint  Patient presents with  . Follow-up    HPI: [name] [name] is a 71 y.o. female here for six month f/u. Last seen by Dr. Jena Gauss 09/2011. H/O NASH cirrhosis. In hospital 02/2012 with left ureteric colic. Ammonia level elavated (114) during hospitalization as well as Tbili (4.7 but not differentiated). Some confusion in the hospital, still feels sluggish. Takes lactulose once daily (cupful, ?4tbsp). BM 5 per day, watery. No blood, melena. No abdominal cramps/gas. Intentional weight loss 13 pounds since 09/2011). Trying to get of insulin. Seeing Dr. Fransico Him for DM management. Denies abdominal pain, vomiting, GERD.   Current Outpatient Prescriptions  Medication Sig Dispense Refill  . ALPRAZolam (XANAX) 0.5 MG tablet Take 0.5 mg by mouth 3 (three) times daily. For anxiety      . aspirin (BAYER LOW STRENGTH) 81 MG EC tablet Take 81 mg by mouth daily.        . budesonide-formoterol (SYMBICORT) 160-4.5 MCG/ACT inhaler Inhale 2 puffs into the lungs 2 (two) times daily.        . clotrimazole (LOTRIMIN) 1 % cream Apply topically 2 (two) times daily.  30 g  0  . enalapril (VASOTEC) 5 MG tablet Take 5 mg by mouth daily.        Marland Kitchen esomeprazole (NEXIUM) 40 MG capsule Take 40 mg by mouth daily before breakfast.        . insulin aspart (NOVOLOG) 100 UNIT/ML injection Inject 10-40 Units into the skin daily as needed. Per sliding scale. Patient injects 10-40 units for levels above 150 to 300       . insulin detemir (LEVEMIR) 100 UNIT/ML injection Inject 60 Units into the skin at bedtime.       Marland Kitchen lactulose (CHRONULAC) 10 GM/15ML solution Take 60 mLs by mouth daily.       Marland Kitchen linagliptin (TRADJENTA) 5 MG TABS tablet Take 5 mg by mouth daily.      . meclizine (ANTIVERT) 25 MG tablet Take 25 mg by mouth 3 (three) times daily as needed. For dizziness/nausea      . Multiple Vitamins-Minerals (CENTRUM SILVER  PO) Take 1 tablet by mouth daily.       . pregabalin (LYRICA) 100 MG capsule Take 100 mg by mouth daily.      Marland Kitchen oxyCODONE (OXY IR/ROXICODONE) 5 MG immediate release tablet Take 1 tablet (5 mg total) by mouth every 4 (four) hours as needed.  30 tablet  0    Allergies as of 04/08/2012 - Review Complete 04/08/2012  Allergen Reaction Noted  . Prochlorperazine edisylate Other (See Comments)     ROS:  General: Negative for anorexia, unintentional weight loss, fever, chills, fatigue, weakness. C/o unsteady on feet. ENT: Negative for hoarseness, difficulty swallowing, nasal congestion. CV: Negative for chest pain, angina, palpitations, dyspnea on exertion, peripheral edema.  Respiratory: Negative for dyspnea at rest, dyspnea on exertion, cough, sputum, wheezing.  GI: See history of present illness. GU:  Negative for dysuria, hematuria, urinary incontinence, urinary frequency, nocturnal urination.  Endo: Negative for unusual weight change.    Physical Examination:   BP 117/74  Pulse 90  Temp(Src) 98.1 F (36.7 C) (Temporal)  Ht 5\' 8"  (1.727 m)  Wt 256 lb 9.6 oz (116.393 kg)  BMI 39.02 kg/m2  General: Well-nourished, well-developed in no acute distress. Accompanied by husband. Eyes: No icterus. Mouth: Oropharyngeal mucosa moist and pink , no lesions erythema or  exudate. Lungs: Clear to auscultation bilaterally.  Heart: Regular rate and rhythm, no murmurs rubs or gallops.  Abdomen: Bowel sounds are normal, nontender, nondistended, no hepatosplenomegaly or masses, no abdominal bruits or hernia , no rebound or guarding.   Extremities: No lower extremity edema. No clubbing or deformities. Neuro: Alert and oriented x 4. No obvious asterixis. Slight tremor. Skin: Warm and dry, no jaundice.   Psych: Alert and cooperative, normal mood and affect.  Labs:  Ammonia 114. Lab Results  Component Value Date   WBC 3.3* 03/28/2012   HGB 11.5* 03/28/2012   HCT 32.4* 03/28/2012   MCV 85.5 03/28/2012     PLT 97* 03/28/2012   Lab Results  Component Value Date   ALT 14 03/28/2012   AST 21 03/28/2012   ALKPHOS 62 03/28/2012   BILITOT 1.8* 03/28/2012   Lab Results  Component Value Date   CREATININE 0.61 03/28/2012   BUN 12 03/28/2012   NA 142 03/28/2012   K 3.6 03/28/2012   CL 109 03/28/2012   CO2 23 03/28/2012    Imaging Studies: Ct Abdomen Pelvis W Contrast  03/26/2012  *RADIOLOGY REPORT*  Clinical Data: Left flank pain, nausea, dizziness, history hypertension, diabetes, diverticulosis, cirrhosis, hiatal hernia, colonic adenomas, GERD, hysterectomy, asthma  CT ABDOMEN AND PELVIS WITH CONTRAST  Technique:  Multidetector CT imaging of the abdomen and pelvis was performed following the standard protocol during bolus administration of intravenous contrast. Sagittal and coronal MPR images reconstructed from axial data set.  Contrast: OMNIPAQUE IOHEXOL 300 MG/ML SOLN. Dilute oral contrast.  Comparison: None Correlation:  MRI abdomen 01/16/2012, 06/29/2011  Findings: Minimal atelectasis left lung base. Fat attenuation mass lesion left kidney 1.8 x 1.2 cm compatible with angiomyolipoma. Second tiny angiomyolipoma 6 mm diameter lateral mid left kidney coronal image 62. 1.5 x 1.2 cm diameter lesion centrally within the liver of intermediate attenuation, unchanged since 06/29/2011, question cyst. Second questionable lesion within the left lobe image 12 question present on prior MRI and unchanged as well. Hepatic margins are nodular consistent with cirrhosis.  Dependent calculi within gallbladder. Spleen is prominent size measuring 13.9 x 8.5 x 14.0 cm in size. Minimal dilatation of the left renal collecting system of left ureter terminating at a 3 mm distal left ureteral calculus image 75. No additional abnormalities of the liver, spleen, pancreas, kidneys, or adrenal glands. Normal appendix. Tiny umbilical hernia containing fat. Uterus surgically absent.  Probable tiny bilateral ovarian cysts, larger on left  2.2 x 1.7 cm. Sigmoid diverticulosis with slight hyperemia of the sigmoid mesocolon question mild sigmoid wall thickening, though this could be an underdistension artifact. No definite pericolic inflammatory changes identified. Cannot completely exclude subtle diverticulitis. Bladder unremarkable. Stomach and remaining bowel loops normal appearance. No additional mass, adenopathy, free fluid, or acute osseous findings.  IMPRESSION:  Cirrhotic liver with splenomegaly. Nonspecific small stable hepatic lesions. Angiomyolipomas of the left kidney. Mild left hydronephrosis and hydroureter secondary to a 3 mm diameter distal left ureteral calculus. Cholelithiasis. Sigmoid diverticulosis with questionable subtle sigmoid wall thickening versus underdistension artifact and slight hypervascularity of the sigmoid mesocolon; unable to exclude subtle diverticulitis in this setting.  Original Report Authenticated By: Lollie Marrow, M.D.

## 2012-04-08 NOTE — Progress Notes (Signed)
Faxed to PCP

## 2012-04-08 NOTE — Assessment & Plan Note (Signed)
NASH cirrhosis. Next liver imaging due in 09/2012 given recent CT. Due AFP 06/2012 as planned. Some hepatic encephalopathy during recent hospitalization for renal colic/stones. Patient having numerous stools daily. C/O feeling mentally slow and tremors. Will check ammonia level. Recommend she split her lactulose dose to BID. If no improvement, could consider adding Xifaxan or changing to Xifaxan and stopping lactulose. OV with Dr. Jena Gauss in 09/2012.

## 2012-04-09 LAB — CBC WITH DIFFERENTIAL/PLATELET
Eosinophils Absolute: 0.1 10*3/uL (ref 0.0–0.7)
Lymphocytes Relative: 20 % (ref 12–46)
Lymphs Abs: 0.7 10*3/uL (ref 0.7–4.0)
MCH: 29.6 pg (ref 26.0–34.0)
Neutrophils Relative %: 70 % (ref 43–77)
Platelets: 116 10*3/uL — ABNORMAL LOW (ref 150–400)
RBC: 4.49 MIL/uL (ref 3.87–5.11)
WBC: 3.6 10*3/uL — ABNORMAL LOW (ref 4.0–10.5)

## 2012-04-10 ENCOUNTER — Ambulatory Visit (HOSPITAL_COMMUNITY)
Admission: RE | Admit: 2012-04-10 | Discharge: 2012-04-10 | Disposition: A | Discharge status: Home / Self Care | Payer: Medicare Other | Source: Ambulatory Visit | Attending: Family Medicine | Admitting: Family Medicine

## 2012-04-10 DIAGNOSIS — R928 Other abnormal and inconclusive findings on diagnostic imaging of breast: Secondary | ICD-10-CM | POA: Insufficient documentation

## 2012-04-10 DIAGNOSIS — Z1231 Encounter for screening mammogram for malignant neoplasm of breast: Secondary | ICD-10-CM | POA: Insufficient documentation

## 2012-04-10 DIAGNOSIS — Z90710 Acquired absence of both cervix and uterus: Secondary | ICD-10-CM | POA: Insufficient documentation

## 2012-04-10 DIAGNOSIS — Z13820 Encounter for screening for osteoporosis: Secondary | ICD-10-CM | POA: Insufficient documentation

## 2012-04-10 DIAGNOSIS — Z139 Encounter for screening, unspecified: Secondary | ICD-10-CM

## 2012-04-10 DIAGNOSIS — Z780 Asymptomatic menopausal state: Secondary | ICD-10-CM | POA: Insufficient documentation

## 2012-04-11 ENCOUNTER — Telehealth: Payer: Self-pay | Admitting: Internal Medicine

## 2012-04-11 NOTE — Telephone Encounter (Signed)
Pt was seen earlier this week and had labs done. She is calling to see if results are back. Please call her at [phone]

## 2012-04-11 NOTE — Telephone Encounter (Signed)
Spoke with pt- informed of ammonia level and cbc and informed her that I would see if LSL has any further recommendations and call her back on Monday.

## 2012-04-14 NOTE — Progress Notes (Signed)
Quick Note:  H/H now normal.  Ammonia better, but elevated a little. Would encourage her to split her lactulose dose into twice a day instead of once daily (as discussed at OV). If she feels like she still has some confusion/lethargy, we could add Xifaxan 550mg  BID. Let me know what she wants to do. ______

## 2012-04-14 NOTE — Telephone Encounter (Signed)
See result note.  

## 2012-04-17 ENCOUNTER — Other Ambulatory Visit: Payer: Self-pay | Admitting: Family Medicine

## 2012-04-17 DIAGNOSIS — R928 Other abnormal and inconclusive findings on diagnostic imaging of breast: Secondary | ICD-10-CM

## 2012-04-30 ENCOUNTER — Other Ambulatory Visit: Payer: Self-pay | Admitting: Family Medicine

## 2012-04-30 ENCOUNTER — Ambulatory Visit (HOSPITAL_COMMUNITY)
Admission: RE | Admit: 2012-04-30 | Discharge: 2012-04-30 | Disposition: A | Discharge status: Home / Self Care | Payer: Medicare Other | Source: Ambulatory Visit | Attending: Family Medicine | Admitting: Family Medicine

## 2012-04-30 DIAGNOSIS — R921 Mammographic calcification found on diagnostic imaging of breast: Secondary | ICD-10-CM

## 2012-04-30 DIAGNOSIS — R928 Other abnormal and inconclusive findings on diagnostic imaging of breast: Secondary | ICD-10-CM | POA: Insufficient documentation

## 2012-05-06 ENCOUNTER — Ambulatory Visit
Admission: RE | Admit: 2012-05-06 | Discharge: 2012-05-06 | Disposition: A | Discharge status: Home / Self Care | Payer: Medicare Other | Source: Ambulatory Visit | Attending: Family Medicine | Admitting: Family Medicine

## 2012-05-06 DIAGNOSIS — R921 Mammographic calcification found on diagnostic imaging of breast: Secondary | ICD-10-CM

## 2012-05-08 ENCOUNTER — Other Ambulatory Visit: Payer: Self-pay | Admitting: Family Medicine

## 2012-05-08 DIAGNOSIS — R921 Mammographic calcification found on diagnostic imaging of breast: Secondary | ICD-10-CM

## 2012-05-21 ENCOUNTER — Ambulatory Visit
Admission: RE | Admit: 2012-05-21 | Discharge: 2012-05-21 | Disposition: A | Discharge status: Home / Self Care | Payer: Medicare Other | Source: Ambulatory Visit | Attending: Family Medicine | Admitting: Family Medicine

## 2012-05-21 DIAGNOSIS — R921 Mammographic calcification found on diagnostic imaging of breast: Secondary | ICD-10-CM

## 2012-05-22 ENCOUNTER — Other Ambulatory Visit: Payer: Self-pay

## 2012-05-22 DIAGNOSIS — K7460 Unspecified cirrhosis of liver: Secondary | ICD-10-CM

## 2012-05-29 ENCOUNTER — Emergency Department (HOSPITAL_COMMUNITY): Payer: Medicare Other

## 2012-05-29 ENCOUNTER — Emergency Department (HOSPITAL_COMMUNITY)
Admission: EM | Admit: 2012-05-29 | Discharge: 2012-05-29 | Disposition: A | Discharge status: Home / Self Care | Payer: Medicare Other | Attending: Emergency Medicine | Admitting: Emergency Medicine

## 2012-05-29 ENCOUNTER — Encounter (HOSPITAL_COMMUNITY): Payer: Self-pay | Admitting: *Deleted

## 2012-05-29 DIAGNOSIS — S93609A Unspecified sprain of unspecified foot, initial encounter: Secondary | ICD-10-CM | POA: Insufficient documentation

## 2012-05-29 DIAGNOSIS — S93602A Unspecified sprain of left foot, initial encounter: Secondary | ICD-10-CM

## 2012-05-29 DIAGNOSIS — K7460 Unspecified cirrhosis of liver: Secondary | ICD-10-CM | POA: Insufficient documentation

## 2012-05-29 DIAGNOSIS — M79609 Pain in unspecified limb: Secondary | ICD-10-CM | POA: Insufficient documentation

## 2012-05-29 DIAGNOSIS — Z79899 Other long term (current) drug therapy: Secondary | ICD-10-CM | POA: Insufficient documentation

## 2012-05-29 DIAGNOSIS — I2510 Atherosclerotic heart disease of native coronary artery without angina pectoris: Secondary | ICD-10-CM | POA: Insufficient documentation

## 2012-05-29 DIAGNOSIS — Y92009 Unspecified place in unspecified non-institutional (private) residence as the place of occurrence of the external cause: Secondary | ICD-10-CM | POA: Insufficient documentation

## 2012-05-29 DIAGNOSIS — E785 Hyperlipidemia, unspecified: Secondary | ICD-10-CM | POA: Insufficient documentation

## 2012-05-29 DIAGNOSIS — Z794 Long term (current) use of insulin: Secondary | ICD-10-CM | POA: Insufficient documentation

## 2012-05-29 DIAGNOSIS — E119 Type 2 diabetes mellitus without complications: Secondary | ICD-10-CM | POA: Insufficient documentation

## 2012-05-29 DIAGNOSIS — I10 Essential (primary) hypertension: Secondary | ICD-10-CM | POA: Insufficient documentation

## 2012-05-29 DIAGNOSIS — R296 Repeated falls: Secondary | ICD-10-CM | POA: Insufficient documentation

## 2012-05-29 MED ORDER — OXYCODONE HCL 5 MG PO TABS: 5.0000 mg | ORAL_TABLET | Freq: Four times a day (QID) | ORAL | Status: DC | PRN

## 2012-05-29 MED ORDER — OXYCODONE-ACETAMINOPHEN 5-325 MG PO TABS: 1.0000 | ORAL_TABLET | Freq: Four times a day (QID) | ORAL | Status: DC | PRN

## 2012-05-29 MED ORDER — OXYCODONE-ACETAMINOPHEN 5-325 MG PO TABS
1.0000 | ORAL_TABLET | Freq: Once | ORAL | Status: DC
  Filled 2012-05-29: qty 1

## 2012-05-29 NOTE — ED Notes (Signed)
Slipped in bathtub this am , injury to lt foot.

## 2012-05-29 NOTE — ED Notes (Signed)
Pt c/o left ankle pain after falling in the shower today.

## 2012-05-29 NOTE — ED Provider Notes (Signed)
History     CSN: [id number]  Arrival date & time 05/29/12  [name] [name]   First MD Initiated Contact with Patient 05/29/12 1844      Chief Complaint  Patient presents with  . Fall    (Consider location/radiation/quality/duration/timing/severity/associated sxs/prior treatment) Patient is a 71 y.o. female presenting with foot injury. The history is provided by the patient.  Foot Injury  The incident occurred 6 to 12 hours ago. The incident occurred at home. The injury mechanism was a fall and a direct blow. The pain is present in the left foot. The quality of the pain is described as aching and throbbing. The pain is moderate. The pain has been constant since onset. Pertinent negatives include no numbness, no inability to bear weight, no loss of motion, no muscle weakness, no loss of sensation and no tingling. She reports no foreign bodies present. The symptoms are aggravated by bearing weight, palpation and activity. She has tried nothing for the symptoms. The treatment provided no relief.    Past Medical History  Diagnosis Date  . CAD in native artery   . Thrombocytopenia, unspecified   . Unspecified hereditary and idiopathic peripheral neuropathy   . Unspecified fall   . Other and unspecified hyperlipidemia   . Unspecified essential hypertension   . Obesity, unspecified   . Shortness of breath   . Personal history of neurosis   . Type II or unspecified type diabetes mellitus without mention of complication, not stated as uncontrolled   . Intrinsic asthma, unspecified   . Allergic rhinitis, cause unspecified   . Cirrhosis of liver     NASH  . Colon adenomas 03/08/10    tcs by Dr. Jena Gauss  . Hyperplastic polyps of stomach 03/08/10    tcs by Dr. Geni Bers  . Chronic gastritis 03/09/11    egd by Dr. Jena Gauss  . History of hemorrhoids 03/08/10    tcs- internal and external  . Diverticula of colon 03/08/10    L side  . Hiatal hernia 03/08/10  . Esophagitis, erosive 03/08/10  . GERD  (gastroesophageal reflux disease)     Past Surgical History  Procedure Date  . Hysterectomy and btl     s/p  . Abdominal hysterectomy   . Tumor excision     rt arm and left foot  . Colonoscopy 03/08/10    Dr. [name]-->ext/int hemorrhoids, anal paipilla, rectal polyps, desc polyps, cecal polyp, left-sided diverticula. suboptiomal prep. next TCS 02/2015. multiple adenomas  . Esophagogastroduodenoscopy 03/08/10    Dr. Dellie Catholic erosive RE, small hh, antral erosions, small 1cm are of mucosal indentation along gastric body of doubtful significance, cystic nodularity of hypophyarynx, base of tongue, base of epiglottis. benign gastric biopsies  . Tubal ligation   . Esophagogastroduodenoscopy 10/08/2011    no esophageal varices, antral erosion. next egd 09/2013  . Foot surgery   . Breast surgery     Family History  Problem Relation Age of Onset  . Coronary artery disease      FH  . Diabetes      FH  . Heart disease Mother   . Colon cancer Neg Hx     History  Substance Use Topics  . Smoking status: Never Smoker   . Smokeless tobacco: Not on file  . Alcohol Use: No    OB History    Grav Para Term Preterm Abortions TAB SAB Ect Mult Living                  Review  of Systems  Constitutional: Negative for fever, chills and fatigue.  HENT: Negative for trouble swallowing, neck pain and neck stiffness.   Respiratory: Negative for cough, shortness of breath and wheezing.   Cardiovascular: Negative for chest pain and palpitations.  Genitourinary: Negative for dysuria and difficulty urinating.  Musculoskeletal: Positive for joint swelling and arthralgias. Negative for myalgias and back pain.  Skin: Negative.  Negative for rash.  Neurological: Negative for dizziness, tingling, weakness, numbness and headaches.  Hematological: Does not bruise/bleed easily.  All other systems reviewed and are negative.    Allergies  Prochlorperazine edisylate  Home Medications   Current  Outpatient Rx  Name Route Sig Dispense Refill  . ALPRAZOLAM 0.5 MG PO TABS Oral Take 0.5 mg by mouth 3 (three) times daily. For anxiety    . ASPIRIN 81 MG PO TBEC Oral Take 81 mg by mouth daily.      . BUDESONIDE-FORMOTEROL FUMARATE 160-4.5 MCG/ACT IN AERO Inhalation Inhale 2 puffs into the lungs 2 (two) times daily.      Marland Kitchen CLOTRIMAZOLE 1 % EX CREA Topical Apply topically 2 (two) times daily. 30 g 0  . ENALAPRIL MALEATE 5 MG PO TABS Oral Take 5 mg by mouth daily.      Marland Kitchen ESOMEPRAZOLE MAGNESIUM 40 MG PO CPDR Oral Take 40 mg by mouth daily before breakfast.      . INSULIN ASPART 100 UNIT/ML [place] SOLN Subcutaneous Inject 10-40 Units into the skin daily as needed. Per sliding scale. Patient injects 10-40 units for levels above 150 to 300     . INSULIN DETEMIR 100 UNIT/ML [place] SOLN Subcutaneous Inject 60 Units into the skin at bedtime.     Marland Kitchen LACTULOSE 10 GM/15ML PO SOLN Oral Take 60 mLs by mouth daily.     Marland Kitchen LINAGLIPTIN 5 MG PO TABS Oral Take 5 mg by mouth daily.    Marland Kitchen MECLIZINE HCL 25 MG PO TABS Oral Take 25 mg by mouth 3 (three) times daily as needed. For dizziness/nausea    . CENTRUM SILVER PO Oral Take 1 tablet by mouth daily.     Marland Kitchen PREGABALIN 100 MG PO CAPS Oral Take 100 mg by mouth daily.      BP 141/58  Pulse 87  Temp 98.3 F (36.8 C) (Oral)  Resp 20  Ht 5\' 8"  (1.727 m)  Wt 250 lb (113.399 kg)  BMI 38.01 kg/m2  SpO2 98%  Physical Exam  Nursing note and vitals reviewed. Constitutional: She is oriented to person, place, and time. She appears well-developed and well-nourished. No distress.  HENT:  Head: Normocephalic and atraumatic.  Cardiovascular: Normal rate, regular rhythm, normal heart sounds and intact distal pulses.   Pulmonary/Chest: Effort normal and breath sounds normal.  Musculoskeletal: She exhibits tenderness.       Left foot: She exhibits tenderness and swelling. She exhibits normal range of motion, no bony tenderness, normal capillary refill, no crepitus, no deformity and  no laceration.       Feet:       ttp of the dorsal left foot  ROM is preserved.  Mild bruising just proximal to the second and third toes of the right foot.DP pulse is brisk, sensation intact.  No obvious deformity  Neurological: She is alert and oriented to person, place, and time. She exhibits normal muscle tone. Coordination normal.  Skin: Skin is warm and dry.    ED Course  Procedures (including critical care time)  Labs Reviewed - No data to display Dg Foot  Complete Left  05/29/2012  *RADIOLOGY REPORT*  Clinical Data: Fall, pain in the region of the head of the second metatarsal  LEFT FOOT - COMPLETE 3+ VIEW  Comparison: None.  Findings: Evidence of remote fifth metatarsal fracture.  Minimal first metatarsal phalangeal joint degenerative change.  No acute fracture or dislocation.  Plantar calcaneal spurring.  IMPRESSION: No acute finding.  Remote fifth metatarsal fracture deformity.  Original Report Authenticated By: Harrel Lemon, M.D.        MDM   Mild STS and early bruising to the proximal second and third toes of the left foot.  DP pulse is brisk,. Sensation intact, CR< 2 sec.  Pain likely related to sprain.  Advised pt to f/u in 1-2 weeks if pain is not improving.  No proximal tenderness or swelling.  Pt has a walker at home.  Prefers to f/u with Eul[facility] Pont and [facility].    The patient appears reasonably screened and/or stabilized for discharge and I doubt any other medical condition or other [facility] requiring further screening, evaluation, or treatment in the ED at this time prior to discharge.     Prescribed: Percocet #[id number]     [name] [name], [place] 06/04/12 2221

## 2012-06-04 ENCOUNTER — Encounter (INDEPENDENT_AMBULATORY_CARE_PROVIDER_SITE_OTHER): Payer: Self-pay | Admitting: General Surgery

## 2012-06-04 ENCOUNTER — Ambulatory Visit (INDEPENDENT_AMBULATORY_CARE_PROVIDER_SITE_OTHER): Payer: Medicare Other | Admitting: Surgery

## 2012-06-04 ENCOUNTER — Encounter (INDEPENDENT_AMBULATORY_CARE_PROVIDER_SITE_OTHER): Payer: Self-pay | Admitting: Surgery

## 2012-06-04 DIAGNOSIS — N6099 Unspecified benign mammary dysplasia of unspecified breast: Secondary | ICD-10-CM | POA: Insufficient documentation

## 2012-06-04 DIAGNOSIS — N6089 Other benign mammary dysplasias of unspecified breast: Secondary | ICD-10-CM

## 2012-06-04 NOTE — Progress Notes (Signed)
Patient ID: [name] [name], female   DOB: [birth year], 71 y.o.   MRN: [id number]  No chief complaint on file.   HPI [name] [name] is a 71 y.o. female. Referred by Dr. Anselmo Pickler for evaluation of right breast atypical ductal hyperplasia. Her cardiologist is Dr. Juanito Doom. Her gastroenterologist is Dr. Jena Gauss.  HPI This is a 71 year old female with multiple significant past medical problems including coronary artery disease, nonalcoholic hepatic cirrhosis, diabetes, who presents after a recent abnormal mammogram. She was having no problems prior to a screening mammogram. An abnormality was noted in both breasts. The right side had a cluster of microcalcifications at 9:00 located posteriorly in the breast. This area was biopsied and showed atypical ductal hyperplasia which was negative for malignancy. She had a small area on the left which also underwent stereotactic biopsy but this turned out to be only fibrocystic disease. She is now referred for excision on the right.  The patient has frequent angina but has not seen Dr. Daleen Squibb in at least 2 years. She also sees Dr. Jena Gauss for her nonalcoholic cirrhosis. She is on lactulose to help control her ammonia level. She does have a degree of hepatic encephalopathy.  Past Medical History  Diagnosis Date  . CAD in native artery   . Thrombocytopenia, unspecified   . Unspecified hereditary and idiopathic peripheral neuropathy   . Unspecified fall   . Other and unspecified hyperlipidemia   . Unspecified essential hypertension   . Obesity, unspecified   . Shortness of breath   . Personal history of neurosis   . Type II or unspecified type diabetes mellitus without mention of complication, not stated as uncontrolled   . Intrinsic asthma, unspecified   . Allergic rhinitis, cause unspecified   . Cirrhosis of liver     NASH  . Colon adenomas 03/08/10    tcs by Dr. Jena Gauss  . Hyperplastic polyps of stomach 03/08/10    tcs by Dr. Geni Bers  . Chronic gastritis  03/09/11    egd by Dr. Jena Gauss  . History of hemorrhoids 03/08/10    tcs- internal and external  . Diverticula of colon 03/08/10    L side  . Hiatal hernia 03/08/10  . Esophagitis, erosive 03/08/10  . GERD (gastroesophageal reflux disease)     Past Surgical History  Procedure Date  . Hysterectomy and btl     s/p  . Abdominal hysterectomy   . Tumor excision     rt arm and left foot  . Colonoscopy 03/08/10    Dr. [name]-->ext/int hemorrhoids, anal paipilla, rectal polyps, desc polyps, cecal polyp, left-sided diverticula. suboptiomal prep. next TCS 02/2015. multiple adenomas  . Esophagogastroduodenoscopy 03/08/10    Dr. Dellie Catholic erosive RE, small hh, antral erosions, small 1cm are of mucosal indentation along gastric body of doubtful significance, cystic nodularity of hypophyarynx, base of tongue, base of epiglottis. benign gastric biopsies  . Tubal ligation   . Esophagogastroduodenoscopy 10/08/2011    no esophageal varices, antral erosion. next egd 09/2013  . Foot surgery   . Breast surgery     Family History  Problem Relation Age of Onset  . Coronary artery disease      FH  . Diabetes      FH  . Heart disease Mother   . Colon cancer Neg Hx     Social History History  Substance Use Topics  . Smoking status: Never Smoker   . Smokeless tobacco: Not on file  . Alcohol Use: No  Allergies  Allergen Reactions  . Prochlorperazine Edisylate Other (See Comments)    Causes anxiety/numbness  . Compazine (Prochlorperazine Edisylate) Anxiety    Causes anxiety & nervousness    Current Outpatient Prescriptions  Medication Sig Dispense Refill  . ALPRAZolam (XANAX) 0.5 MG tablet Take 0.5 mg by mouth 3 (three) times daily. For anxiety      . aspirin (BAYER LOW STRENGTH) 81 MG EC tablet Take 81 mg by mouth daily.        Marland Kitchen BAYER CONTOUR TEST test strip       . budesonide-formoterol (SYMBICORT) 160-4.5 MCG/ACT inhaler Inhale 2 puffs into the lungs 2 (two) times daily.        .  clotrimazole (LOTRIMIN) 1 % cream Apply topically 2 (two) times daily.  30 g  0  . enalapril (VASOTEC) 5 MG tablet Take 5 mg by mouth daily.        Marland Kitchen esomeprazole (NEXIUM) 40 MG capsule Take 40 mg by mouth daily before breakfast.        . insulin aspart (NOVOLOG) 100 UNIT/ML injection Inject 10-40 Units into the skin daily as needed. Per sliding scale. Patient injects 10-40 units for levels above 150 to 300       . insulin detemir (LEVEMIR) 100 UNIT/ML injection Inject 60 Units into the skin at bedtime.       Marland Kitchen lactulose (CHRONULAC) 10 GM/15ML solution Take 60 mLs by mouth daily.       Marland Kitchen linagliptin (TRADJENTA) 5 MG TABS tablet Take 5 mg by mouth daily.      . meclizine (ANTIVERT) 25 MG tablet Take 25 mg by mouth 3 (three) times daily as needed. For dizziness/nausea      . Multiple Vitamins-Minerals (CENTRUM SILVER PO) Take 1 tablet by mouth daily.       Marland Kitchen oxyCODONE (ROXICODONE) 5 MG immediate release tablet Take 1 tablet (5 mg total) by mouth every 6 (six) hours as needed for pain.  6 tablet  0  . pregabalin (LYRICA) 100 MG capsule Take 150 mg by mouth 2 (two) times daily.       . ciprofloxacin (CIPRO) 500 MG tablet       . GENERLAC 10 GM/15ML SOLN       . lidocaine (XYLOCAINE) 5 % ointment       . traMADol (ULTRAM) 50 MG tablet         Review of Systems Review of Systems  Constitutional: Positive for fatigue. Negative for fever, chills and unexpected weight change.  HENT: Negative for hearing loss, congestion, sore throat, trouble swallowing and voice change.   Eyes: Negative for visual disturbance.  Respiratory: Negative for cough and wheezing.   Cardiovascular: Negative for chest pain, palpitations and leg swelling.  Gastrointestinal: Positive for abdominal distention. Negative for nausea, vomiting, abdominal pain, diarrhea, constipation, blood in stool and anal bleeding.  Genitourinary: Negative for hematuria, vaginal bleeding and difficulty urinating.  Musculoskeletal: Negative for  arthralgias.  Skin: Negative for rash and wound.  Neurological: Positive for dizziness and light-headedness. Negative for seizures, syncope and headaches.  Hematological: Negative for adenopathy. Does not bruise/bleed easily.  Psychiatric/Behavioral: Negative for confusion.    Physical Exam Physical Exam WDWN in NAD She appears mildly unsteady on her feet HEENT:  EOMI, sclera anicteric Neck:  No masses, no thyromegaly Breasts:  Symmetric; healed needle biopsy sites; no palpable masses in either breast; no axillary lymphadenopathy on either side Lungs:  CTA bilaterally; normal respiratory effort CV:  Regular rate and  rhythm; no murmurs Abd:  +bowel sounds, soft, non-tender, no masses Ext:  Well-perfused; no edema Skin:  Warm, dry; no sign of jaundice  Data Reviewed *RADIOLOGY REPORT*  Clinical Data: Screening.  Findings: Two views of each breast demonstrate heterogeneously  dense tissue. In the left breast, calcifications warrant further  imaging evaluation with magnified views. In the right breast,  calcifications warrant further imaging evaluation with magnified  views. Compared with priors.  Images were processed with CAD.  IMPRESSION:  Further imaging evaluation is suggested for calcifications in the  left breast.  Further imaging evaluation is suggested for calcifications in the  right breast.  RECOMMENDATION:  Diagnostic mammogramof both breasts. (Code:FI-B-11M)  BI-RADS CATEGORY 0: Incomplete. Need additional imaging  evaluation and/or prior mammograms for comparison.  Original Report Authenticated By: Patterson Hammersmith, M.D.  *RADIOLOGY REPORT*  Clinical Data: Abnormal bilateral screening mammogram for  calcifications  DIGITAL DIAGNOSTIC BILATERAL MAMMOGRAM  Comparison: Analogue mammograms dated 07/29/2007  Findings: Magnification views of the upper outer quadrant of the  right breast were performed. Far posteriorly there is a developing  cluster of  calcifications spanning an area of approximately 2.5 cm.  Calcifications are mostly punctate but some follow a ductal  pattern. There is no associated mass. Magnification views of the  left upper outer quadrant demonstrate a cluster of calcifications  spanning an area of approximately 5 mm. These are round with no  linear forms and are felt to likely be benign.  IMPRESSION:  1. Suspicious right breast calcifications. We discussed surgical  excision of the calcifications. The patient would like to attempt  a stereotactic biopsy. We discussed the procedure may be difficult  secondary to the far posterior location of calcifications but this  will be attempted at the [facility] on 05/06/2012.  2. Probable benign calcifications in the left breast. Biopsy of  the right breast calcifications will be performed and if they are  benign than I would recommend short-term interval follow-up left  mammogram in 6 months.  BI-RADS CATEGORY 4: Suspicious abnormality - biopsy should be  considered.  Original Report Authenticated By: Littie Deeds. [name] [name], M.D.      Stoney Bang, MD Fri May 09, 2012 10:20:18 AM EDT       **ADDENDUM** CREATED: 05/09/2012 10:15:22  The pathology associated with the right breast stereotactic core  biopsy demonstrates atypical ductal hyperplasia. Surgical excision  is recommended. I have discussed the findings by telephone with  the patient and answered her questions. The patient states her  biopsy site is clean and dry without hematoma formation. Surgical  consultation has been arranged with Dr. Corliss Skains on 06/04/2012. Prior  to this consultation, in view of atypical ductal hyperplasia  present on the right sided biopsy, stereotactic core biopsy of the  left sided calcifications is recommended. This has been scheduled  for 05/21/2012. The patient was encouraged to call the [facility] for additional questions or concerns.  Addended by: Rolla Plate, M.D. on 05/09/2012 10:15:22.  **END ADDENDUM** SIGNED BY: Rolla Plate, M.D.      Study Result     *RADIOLOGY REPORT*  Clinical Data: Right breast calcifications.  STEREOTACTIC-GUIDED VACUUM ASSISTED BIOPSY OF THE RIGHT BREAST AND  SPECIMEN RADIOGRAPH  Comparison: Previous exams  I met with the patient and we discussed the procedure of  stereotactic-guided biopsy, including benefits and alternatives.  We discussed the high likelihood of a successful procedure. We  discussed the risks of the procedure, including infection,  bleeding, tissue injury, clip migration, and inadequate sampling.  Informed, written consent was given.  Using sterile technique, 2% lidocaine, stereotactic guidance, and a  9 gauge vacuum assisted device, biopsy was performed of the  calcifications located posteriorly and slightly laterally within  the right breast at the 9 o'clock position. Specimen radiograph  was performed, showing representative calcifications within the  specimen. Specimens with calcifications are identified for  pathology.  At the conclusion of the procedure, a T-shaped tissue marker clip  was deployed into the biopsy cavity. Follow-up 2-view mammogram  confirmed clip to be in appropriate position.  IMPRESSION:  Stereotactic-guided biopsy of right breast calcifications as  discussed above. No apparent complications.  Original Report Authenticated By: Rolla Plate, M.D.      Assessment    Atypical ductal hyperplasia right breast     Plan    Cardiac clearance by Dr. Daleen Squibb Recommend right breast needle-localized lumpectomy.  The surgical procedure has been discussed with the patient.  Potential risks, benefits, alternative treatments, and expected outcomes have been explained.  All of the patient's questions at this time have been answered.  The likelihood of reaching the patient's treatment goal is good.  The patient understand the proposed surgical procedure and  wishes to proceed.         Loza Prell K. 06/04/2012, 4:28 PM

## 2012-06-06 ENCOUNTER — Ambulatory Visit (INDEPENDENT_AMBULATORY_CARE_PROVIDER_SITE_OTHER): Payer: Medicare Other | Admitting: Cardiology

## 2012-06-06 ENCOUNTER — Telehealth: Payer: Self-pay | Admitting: *Deleted

## 2012-06-06 ENCOUNTER — Ambulatory Visit: Payer: Medicare Other | Admitting: Cardiology

## 2012-06-06 ENCOUNTER — Encounter: Payer: Self-pay | Admitting: Cardiology

## 2012-06-06 VITALS — BP 122/70 | HR 88 | Ht 68.0 in | Wt 250.8 lb

## 2012-06-06 DIAGNOSIS — E1165 Type 2 diabetes mellitus with hyperglycemia: Secondary | ICD-10-CM

## 2012-06-06 DIAGNOSIS — Z01818 Encounter for other preprocedural examination: Secondary | ICD-10-CM

## 2012-06-06 DIAGNOSIS — Z01810 Encounter for preprocedural cardiovascular examination: Secondary | ICD-10-CM

## 2012-06-06 DIAGNOSIS — E785 Hyperlipidemia, unspecified: Secondary | ICD-10-CM

## 2012-06-06 DIAGNOSIS — I10 Essential (primary) hypertension: Secondary | ICD-10-CM

## 2012-06-06 DIAGNOSIS — I2510 Atherosclerotic heart disease of native coronary artery without angina pectoris: Secondary | ICD-10-CM

## 2012-06-06 LAB — AFP TUMOR MARKER: AFP-Tumor Marker: 3.7 ng/mL (ref 0.0–8.0)

## 2012-06-06 NOTE — Assessment & Plan Note (Addendum)
I have cleared her at low surgical risk for breast surgery. She does have multiple cardiovascular risk factors but currently has no signs of clinical disease. EKG is normal as is her exam. Her last catheterization in 2008 showed nonobstructive disease in the LAD. I have sent note to Dr.Tsui as well as anesthesia. I'll see her back when necessary.

## 2012-06-06 NOTE — Patient Instructions (Addendum)
Your physician recommends that you continue on your current medications as directed. Please refer to the Current Medication list given to you today.  Your physician recommends that you schedule a follow-up appointment as needed with Dr. Daleen Squibb.  You have been cleared for your upcoming surgery.

## 2012-06-06 NOTE — Progress Notes (Signed)
HPI [name] [name] is referred by Dr Kendrick Ranch for preoperative clearance for breast biopsy.  I have seen her in the past for some atypical chest pain and shortness of breath. At that time I felt was multifactorial.  She does have a history of nonobstructive coronary disease in the LAD with her last catheterization being in 2008. She has normal left ventricular function.  She does have multiple cardiac risk factors for progressive coronary disease. She denies any angina or chest discomfort or other ischemic symptoms.  She has normal left ventricular function with an echocardiogram in 2011. There's a question of a TIA in the past. She has not had any recurrent events.  She denies any orthopnea, PND or edema. She's had no syncope presyncope or palpitations.  Past Medical History  Diagnosis Date  . CAD in native artery   . Thrombocytopenia, unspecified   . Unspecified hereditary and idiopathic peripheral neuropathy   . Unspecified fall   . Other and unspecified hyperlipidemia   . Unspecified essential hypertension   . Obesity, unspecified   . Shortness of breath   . Personal history of neurosis   . Type II or unspecified type diabetes mellitus without mention of complication, not stated as uncontrolled   . Intrinsic asthma, unspecified   . Allergic rhinitis, cause unspecified   . Cirrhosis of liver     NASH  . Colon adenomas 03/08/10    tcs by Dr. Jena Gauss  . Hyperplastic polyps of stomach 03/08/10    tcs by Dr. Geni Bers  . Chronic gastritis 03/09/11    egd by Dr. Jena Gauss  . History of hemorrhoids 03/08/10    tcs- internal and external  . Diverticula of colon 03/08/10    L side  . Hiatal hernia 03/08/10  . Esophagitis, erosive 03/08/10  . GERD (gastroesophageal reflux disease)     Current Outpatient Prescriptions  Medication Sig Dispense Refill  . ALPRAZolam (XANAX) 0.5 MG tablet Take 0.5 mg by mouth 3 (three) times daily. For anxiety      . aspirin (BAYER LOW STRENGTH) 81 MG EC tablet  Take 81 mg by mouth daily.        Marland Kitchen BAYER CONTOUR TEST test strip       . budesonide-formoterol (SYMBICORT) 160-4.5 MCG/ACT inhaler Inhale 2 puffs into the lungs 2 (two) times daily.        . clotrimazole (LOTRIMIN) 1 % cream Apply topically 2 (two) times daily.  30 g  0  . enalapril (VASOTEC) 5 MG tablet Take 5 mg by mouth daily.        Marland Kitchen esomeprazole (NEXIUM) 40 MG capsule Take 40 mg by mouth daily before breakfast.        . insulin aspart (NOVOLOG) 100 UNIT/ML injection Inject 10-40 Units into the skin daily as needed. Per sliding scale. Patient injects 10-40 units for levels above 150 to 300       . insulin detemir (LEVEMIR) 100 UNIT/ML injection Inject 60 Units into the skin at bedtime.       Marland Kitchen lactulose (CHRONULAC) 10 GM/15ML solution Take 60 mLs by mouth daily.       Marland Kitchen lidocaine (XYLOCAINE) 5 % ointment Apply 1 application topically daily.       Marland Kitchen linagliptin (TRADJENTA) 5 MG TABS tablet Take 5 mg by mouth daily.      . meclizine (ANTIVERT) 25 MG tablet Take 25 mg by mouth 3 (three) times daily as needed. For dizziness/nausea      .  Multiple Vitamins-Minerals (CENTRUM SILVER PO) Take 1 tablet by mouth daily.       . pregabalin (LYRICA) 100 MG capsule Take 150 mg by mouth 2 (two) times daily.       . traMADol (ULTRAM) 50 MG tablet Take 50 mg by mouth daily.         Allergies  Allergen Reactions  . Prochlorperazine Edisylate Other (See Comments)    Causes anxiety/numbness  . Compazine (Prochlorperazine Edisylate) Anxiety    Causes anxiety & nervousness    Family History  Problem Relation Age of Onset  . Coronary artery disease      FH  . Diabetes      FH  . Heart disease Mother   . Colon cancer Neg Hx     History   Social History  . Marital Status: Married    Spouse Name: N/A    Number of Children: 3  . Years of Education: N/A   Occupational History  . Not on file.   Social History Main Topics  . Smoking status: Never Smoker   . Smokeless tobacco: Not on file  .  Alcohol Use: No  . Drug Use: No  . Sexually Active: Not Currently   Other Topics Concern  . Not on file   Social History Narrative  . No narrative on file    ROS ALL NEGATIVE EXCEPT THOSE NOTED IN HPI  PE  General Appearance: well developed, well nourished in no acute distress, obese HEENT: symmetrical face, PERRLA, good dentition  Neck: no JVD, thyromegaly, or adenopathy, trachea midline Chest: symmetric without deformity Cardiac: PMI non-displaced, RRR, normal S1, S2, no gallop or murmur Lung: clear to ausculation and percussion Vascular: all pulses full without bruits  Abdominal: nondistended, nontender, good bowel sounds, no HSM, no bruits Extremities: no cyanosis, clubbing or edema, no sign of DVT, no varicosities  Skin: normal color, no rashes Neuro: alert and oriented x 3, non-focal Pysch: normal affect  EKG Normal sinus rhythm, right superior axis otherwise unremarkable. BMET    Component Value Date/Time   NA 142 03/28/2012 0543   K 3.6 03/28/2012 0543   CL 109 03/28/2012 0543   CO2 23 03/28/2012 0543   GLUCOSE 191* 03/28/2012 0543   BUN 12 03/28/2012 0543   CREATININE 0.61 03/28/2012 0543   CALCIUM 8.4 03/28/2012 0543   GFRNONAA >90 03/28/2012 0543   GFRAA >90 03/28/2012 0543    Lipid Panel     Component Value Date/Time   CHOL  Value: 141        ATP III CLASSIFICATION:  <200     mg/dL   Desirable  454-098  mg/dL   Borderline High  >=119    mg/dL   High        1/47/8295 1738   TRIG 104 12/04/2009 1738   HDL 56 12/04/2009 1738   CHOLHDL 2.5 12/04/2009 1738   VLDL 21 12/04/2009 1738   LDLCALC  Value: 64        Total Cholesterol/HDL:CHD Risk Coronary Heart Disease Risk Table                     Men   Women  1/2 Average Risk   3.4   3.3  Average Risk       5.0   4.4  2 X Average Risk   9.6   7.1  3 X Average Risk  23.4   11.0        Use the calculated  Patient Ratio above and the CHD Risk Table to determine the patient's CHD Risk.        ATP III CLASSIFICATION (LDL):  <100      mg/dL   Optimal  409-811  mg/dL   Near or Above                    Optimal  130-159  mg/dL   Borderline  914-782  mg/dL   High  >956     mg/dL   Very High 12/26/863 7846    CBC    Component Value Date/Time   WBC 3.6* 04/08/2012 0948   RBC 4.49 04/08/2012 0948   HGB 13.3 04/08/2012 0948   HCT 38.9 04/08/2012 0948   PLT 116* 04/08/2012 0948   MCV 86.6 04/08/2012 0948   MCH 29.6 04/08/2012 0948   MCHC 34.2 04/08/2012 0948   RDW 14.3 04/08/2012 0948   LYMPHSABS 0.7 04/08/2012 0948   MONOABS 0.3 04/08/2012 0948   EOSABS 0.1 04/08/2012 0948   BASOSABS 0.0 04/08/2012 0948

## 2012-06-06 NOTE — ED Provider Notes (Signed)
Medical screening examination/treatment/procedure(s) were performed by non-physician practitioner and as supervising physician I was immediately available for consultation/collaboration.  Darica Goren, MD 06/06/12 0745 

## 2012-06-06 NOTE — Telephone Encounter (Signed)
Called pt about moving appt time up to 3pm. Pt and husband agree Mylo Red RN

## 2012-06-16 ENCOUNTER — Telehealth: Payer: Self-pay

## 2012-06-16 NOTE — Telephone Encounter (Signed)
Pt called wanting lab results  

## 2012-06-17 ENCOUNTER — Other Ambulatory Visit: Payer: Self-pay | Admitting: Gastroenterology

## 2012-06-17 DIAGNOSIS — K7460 Unspecified cirrhosis of liver: Secondary | ICD-10-CM

## 2012-06-17 NOTE — Progress Notes (Signed)
Quick Note:  AFP stable, normal. Repeat in 6 months. OV with RMR Oct 2013 ______

## 2012-06-17 NOTE — Telephone Encounter (Signed)
Addressed under result notes.  

## 2012-06-20 ENCOUNTER — Other Ambulatory Visit (INDEPENDENT_AMBULATORY_CARE_PROVIDER_SITE_OTHER): Payer: Self-pay | Admitting: Surgery

## 2012-06-20 DIAGNOSIS — N6099 Unspecified benign mammary dysplasia of unspecified breast: Secondary | ICD-10-CM

## 2012-06-25 ENCOUNTER — Encounter (HOSPITAL_BASED_OUTPATIENT_CLINIC_OR_DEPARTMENT_OTHER): Payer: Self-pay | Admitting: *Deleted

## 2012-06-25 ENCOUNTER — Other Ambulatory Visit: Payer: Self-pay | Admitting: Internal Medicine

## 2012-06-25 ENCOUNTER — Telehealth: Payer: Self-pay | Admitting: Internal Medicine

## 2012-06-25 DIAGNOSIS — K7460 Unspecified cirrhosis of liver: Secondary | ICD-10-CM

## 2012-06-25 NOTE — Telephone Encounter (Signed)
Patient is getting ready to have surgery and she will call back to schedule the U/S when she has recovered

## 2012-06-25 NOTE — Progress Notes (Signed)
Pt saw dr wall for cardiac clearance ekg done 7/13 Echo-3/11 Does not drive-neuropathy pain Will need istat  Denies any sleep apnea or snoring

## 2012-06-25 NOTE — Telephone Encounter (Signed)
August recall has that patient needs a repeat ultrasound in 6 months

## 2012-06-27 ENCOUNTER — Encounter (HOSPITAL_BASED_OUTPATIENT_CLINIC_OR_DEPARTMENT_OTHER): Payer: Self-pay | Admitting: *Deleted

## 2012-06-30 ENCOUNTER — Ambulatory Visit
Admission: RE | Admit: 2012-06-30 | Discharge: 2012-06-30 | Disposition: A | Discharge status: Home / Self Care | Payer: Medicare Other | Source: Ambulatory Visit | Attending: Surgery | Admitting: Surgery

## 2012-06-30 ENCOUNTER — Encounter (HOSPITAL_BASED_OUTPATIENT_CLINIC_OR_DEPARTMENT_OTHER): Payer: Self-pay | Admitting: Anesthesiology

## 2012-06-30 ENCOUNTER — Ambulatory Visit (HOSPITAL_BASED_OUTPATIENT_CLINIC_OR_DEPARTMENT_OTHER): Payer: Medicare Other | Admitting: Anesthesiology

## 2012-06-30 ENCOUNTER — Encounter (HOSPITAL_BASED_OUTPATIENT_CLINIC_OR_DEPARTMENT_OTHER)
Admission: RE | Disposition: A | Discharge status: Home / Self Care | Payer: Self-pay | Source: Ambulatory Visit | Attending: Surgery

## 2012-06-30 ENCOUNTER — Telehealth (INDEPENDENT_AMBULATORY_CARE_PROVIDER_SITE_OTHER): Payer: Self-pay

## 2012-06-30 ENCOUNTER — Ambulatory Visit (HOSPITAL_BASED_OUTPATIENT_CLINIC_OR_DEPARTMENT_OTHER)
Admission: RE | Admit: 2012-06-30 | Discharge: 2012-06-30 | Disposition: A | Discharge status: Home / Self Care | Payer: Medicare Other | Source: Ambulatory Visit | Attending: Surgery | Admitting: Surgery

## 2012-06-30 DIAGNOSIS — N6099 Unspecified benign mammary dysplasia of unspecified breast: Secondary | ICD-10-CM

## 2012-06-30 DIAGNOSIS — I2510 Atherosclerotic heart disease of native coronary artery without angina pectoris: Secondary | ICD-10-CM | POA: Insufficient documentation

## 2012-06-30 DIAGNOSIS — N6089 Other benign mammary dysplasias of unspecified breast: Secondary | ICD-10-CM | POA: Insufficient documentation

## 2012-06-30 DIAGNOSIS — I10 Essential (primary) hypertension: Secondary | ICD-10-CM | POA: Insufficient documentation

## 2012-06-30 DIAGNOSIS — F430 Acute stress reaction: Secondary | ICD-10-CM | POA: Insufficient documentation

## 2012-06-30 DIAGNOSIS — Z8711 Personal history of peptic ulcer disease: Secondary | ICD-10-CM | POA: Insufficient documentation

## 2012-06-30 DIAGNOSIS — K449 Diaphragmatic hernia without obstruction or gangrene: Secondary | ICD-10-CM | POA: Insufficient documentation

## 2012-06-30 DIAGNOSIS — E109 Type 1 diabetes mellitus without complications: Secondary | ICD-10-CM | POA: Insufficient documentation

## 2012-06-30 DIAGNOSIS — K7460 Unspecified cirrhosis of liver: Secondary | ICD-10-CM | POA: Insufficient documentation

## 2012-06-30 DIAGNOSIS — J45909 Unspecified asthma, uncomplicated: Secondary | ICD-10-CM | POA: Insufficient documentation

## 2012-06-30 HISTORY — DX: Presence of spectacles and contact lenses: Z97.3

## 2012-06-30 LAB — POCT I-STAT, CHEM 8
Calcium, Ion: 1.17 mmol/L (ref 1.13–1.30)
Creatinine, Ser: 0.7 mg/dL (ref 0.50–1.10)
Hemoglobin: 14.6 g/dL (ref 12.0–15.0)
Sodium: 139 mEq/L (ref 135–145)
TCO2: 24 mmol/L (ref 0–100)

## 2012-06-30 LAB — GLUCOSE, CAPILLARY: Glucose-Capillary: 210 mg/dL — ABNORMAL HIGH (ref 70–99)

## 2012-06-30 SURGERY — BREAST LUMPECTOMY WITH NEEDLE LOCALIZATION
Anesthesia: General | Site: Breast | Laterality: Right | Wound class: Clean

## 2012-06-30 MED ORDER — ONDANSETRON HCL 4 MG/2ML IJ SOLN
INTRAMUSCULAR | Status: DC | PRN
  Administered 2012-06-30: 4 mg via INTRAVENOUS

## 2012-06-30 MED ORDER — ONDANSETRON HCL 4 MG/2ML IJ SOLN: 4.0000 mg | INTRAMUSCULAR | Status: DC | PRN

## 2012-06-30 MED ORDER — FENTANYL CITRATE 0.05 MG/ML IJ SOLN
INTRAMUSCULAR | Status: DC | PRN
  Administered 2012-06-30: 100 ug via INTRAVENOUS
  Administered 2012-06-30: 25 ug via INTRAVENOUS

## 2012-06-30 MED ORDER — LACTATED RINGERS IV SOLN
INTRAVENOUS | Status: DC
  Administered 2012-06-30 (×2): via INTRAVENOUS

## 2012-06-30 MED ORDER — DEXAMETHASONE SODIUM PHOSPHATE 4 MG/ML IJ SOLN
INTRAMUSCULAR | Status: DC | PRN
  Administered 2012-06-30: 10 mg via INTRAVENOUS

## 2012-06-30 MED ORDER — HYDROCODONE-ACETAMINOPHEN 5-325 MG PO TABS: 1.0000 | ORAL_TABLET | ORAL | Status: AC | PRN

## 2012-06-30 MED ORDER — HYDROCODONE-ACETAMINOPHEN 5-325 MG PO TABS: 1.0000 | ORAL_TABLET | ORAL | Status: DC | PRN

## 2012-06-30 MED ORDER — LIDOCAINE HCL (CARDIAC) 20 MG/ML IV SOLN
INTRAVENOUS | Status: DC | PRN
  Administered 2012-06-30: 50 mg via INTRAVENOUS

## 2012-06-30 MED ORDER — HYDROMORPHONE HCL PF 1 MG/ML IJ SOLN: 0.2500 mg | INTRAMUSCULAR | Status: DC | PRN

## 2012-06-30 MED ORDER — INSULIN ASPART 100 UNIT/ML ~~LOC~~ SOLN
6.0000 [IU] | Freq: Once | SUBCUTANEOUS | Status: AC
  Administered 2012-06-30: 6 [IU] via SUBCUTANEOUS

## 2012-06-30 MED ORDER — BUPIVACAINE-EPINEPHRINE 0.25% -1:200000 IJ SOLN
INTRAMUSCULAR | Status: DC | PRN
  Administered 2012-06-30: 6 mL

## 2012-06-30 MED ORDER — DEXTROSE 5 % IV SOLN
3.0000 g | INTRAVENOUS | Status: AC
  Administered 2012-06-30: 3 g via INTRAVENOUS

## 2012-06-30 MED ORDER — MORPHINE SULFATE 2 MG/ML IJ SOLN: 2.0000 mg | INTRAMUSCULAR | Status: DC | PRN

## 2012-06-30 MED ORDER — PROPOFOL 10 MG/ML IV EMUL
INTRAVENOUS | Status: DC | PRN
  Administered 2012-06-30: 150 mg via INTRAVENOUS
  Administered 2012-06-30: 30 mg via INTRAVENOUS

## 2012-06-30 MED ORDER — SUCCINYLCHOLINE CHLORIDE 20 MG/ML IJ SOLN
INTRAMUSCULAR | Status: DC | PRN
  Administered 2012-06-30: 100 mg via INTRAVENOUS

## 2012-06-30 SURGICAL SUPPLY — 48 items
APPLICATOR COTTON TIP 6IN STRL (MISCELLANEOUS) ×2 IMPLANT
BENZOIN TINCTURE PRP APPL 2/3 (GAUZE/BANDAGES/DRESSINGS) ×2 IMPLANT
BLADE HEX COATED 2.75 (ELECTRODE) ×2 IMPLANT
BLADE SURG 15 STRL LF DISP TIS (BLADE) ×1 IMPLANT
BLADE SURG 15 STRL SS (BLADE) ×1
CANISTER SUCTION 1200CC (MISCELLANEOUS) ×2 IMPLANT
CHLORAPREP W/TINT 26ML (MISCELLANEOUS) ×2 IMPLANT
CLOTH BEACON ORANGE TIMEOUT ST (SAFETY) ×2 IMPLANT
COVER MAYO STAND STRL (DRAPES) ×2 IMPLANT
COVER TABLE BACK 60X90 (DRAPES) ×2 IMPLANT
DECANTER SPIKE VIAL GLASS SM (MISCELLANEOUS) IMPLANT
DEVICE DUBIN W/COMP PLATE 8390 (MISCELLANEOUS) IMPLANT
DRAPE PED LAPAROTOMY (DRAPES) ×2 IMPLANT
DRAPE UTILITY XL STRL (DRAPES) ×2 IMPLANT
DRSG TEGADERM 4X4.75 (GAUZE/BANDAGES/DRESSINGS) ×2 IMPLANT
ELECT BLADE 6.5 .24CM SHAFT (ELECTRODE) ×2 IMPLANT
ELECT REM PT RETURN 9FT ADLT (ELECTROSURGICAL) ×2
ELECTRODE REM PT RTRN 9FT ADLT (ELECTROSURGICAL) ×1 IMPLANT
GAUZE SPONGE 4X4 12PLY STRL LF (GAUZE/BANDAGES/DRESSINGS) IMPLANT
GLOVE BIO SURGEON STRL SZ7 (GLOVE) ×2 IMPLANT
GLOVE BIOGEL M STRL SZ7.5 (GLOVE) ×4 IMPLANT
GLOVE BIOGEL PI IND STRL 7.5 (GLOVE) ×1 IMPLANT
GLOVE BIOGEL PI IND STRL 8 (GLOVE) ×1 IMPLANT
GLOVE BIOGEL PI INDICATOR 7.5 (GLOVE) ×1
GLOVE BIOGEL PI INDICATOR 8 (GLOVE) ×1
GOWN PREVENTION PLUS XLARGE (GOWN DISPOSABLE) ×2 IMPLANT
GOWN PREVENTION PLUS XXLARGE (GOWN DISPOSABLE) ×2 IMPLANT
KIT MARKER MARGIN INK (KITS) ×2 IMPLANT
NEEDLE HYPO 25X1 1.5 SAFETY (NEEDLE) ×2 IMPLANT
NS IRRIG 1000ML POUR BTL (IV SOLUTION) ×2 IMPLANT
PACK BASIN DAY SURGERY FS (CUSTOM PROCEDURE TRAY) ×2 IMPLANT
PENCIL BUTTON HOLSTER BLD 10FT (ELECTRODE) ×2 IMPLANT
SLEEVE SCD COMPRESS KNEE MED (MISCELLANEOUS) ×2 IMPLANT
SPONGE LAP 4X18 X RAY DECT (DISPOSABLE) ×2 IMPLANT
STRIP CLOSURE SKIN 1/2X4 (GAUZE/BANDAGES/DRESSINGS) ×2 IMPLANT
STRIP CLOSURE SKIN 1/4X4 (GAUZE/BANDAGES/DRESSINGS) ×2 IMPLANT
SUT CHROMIC 3 0 SH 27 (SUTURE) IMPLANT
SUT MON AB 4-0 PC3 18 (SUTURE) ×2 IMPLANT
SUT SILK 2 0 SH (SUTURE) IMPLANT
SUT VIC AB 3-0 SH 27 (SUTURE) ×1
SUT VIC AB 3-0 SH 27X BRD (SUTURE) ×1 IMPLANT
SYR BULB IRRIGATION 50ML (SYRINGE) ×2 IMPLANT
SYR CONTROL 10ML LL (SYRINGE) ×2 IMPLANT
TOWEL OR 17X24 6PK STRL BLUE (TOWEL DISPOSABLE) ×2 IMPLANT
TOWEL OR NON WOVEN STRL DISP B (DISPOSABLE) ×2 IMPLANT
TUBE CONNECTING 20X1/4 (TUBING) ×2 IMPLANT
WATER STERILE IRR 1000ML POUR (IV SOLUTION) IMPLANT
YANKAUER SUCT BULB TIP NO VENT (SUCTIONS) ×2 IMPLANT

## 2012-06-30 NOTE — Anesthesia Postprocedure Evaluation (Signed)
  Anesthesia Post-op Note  Patient: [name] [name]  Procedure(s) Performed: Procedure(s) (LRB): BREAST LUMPECTOMY WITH NEEDLE LOCALIZATION (Right)  Patient Location: PACU  Anesthesia Type: General  Level of Consciousness: awake, alert  and oriented  Airway and Oxygen Therapy: Patient Spontanous Breathing  Post-op Pain: none  Post-op Assessment: Post-op Vital signs reviewed, Patient's Cardiovascular Status Stable, Respiratory Function Stable, Patent Airway and No signs of Nausea or vomiting  Post-op Vital Signs: Reviewed and stable  Complications: No apparent anesthesia complications

## 2012-06-30 NOTE — Anesthesia Preprocedure Evaluation (Addendum)
Anesthesia Evaluation  Patient identified by MRN, date of birth, ID band Patient awake    Reviewed: Allergy & Precautions, H&P , NPO status , Patient's Chart, lab work & pertinent test results  Airway Mallampati: III TM Distance: >3 FB Neck ROM: Full    Dental No notable dental hx. (+) Poor Dentition and Dental Advisory Given   Pulmonary shortness of breath, asthma ,  breath sounds clear to auscultation  Pulmonary exam normal       Cardiovascular hypertension, On Medications + CAD negative cardio ROS  Rhythm:Regular Rate:Normal     Neuro/Psych  Neuromuscular disease negative psych ROS   GI/Hepatic hiatal hernia, PUD, GERD-  Medicated and Controlled,(+) Cirrhosis -       ,   Endo/Other  Type 1, Insulin Dependent  Renal/GU negative Renal ROS  negative genitourinary   Musculoskeletal   Abdominal   Peds  Hematology negative hematology ROS (+)   Anesthesia Other Findings   Reproductive/Obstetrics negative OB ROS                         Anesthesia Physical Anesthesia Plan  ASA: III  Anesthesia Plan: General   Post-op Pain Management:    Induction: Intravenous  Airway Management Planned: Oral ETT  Additional Equipment:   Intra-op Plan:   Post-operative Plan: Extubation in OR  Informed Consent: I have reviewed the patients History and Physical, chart, labs and discussed the procedure including the risks, benefits and alternatives for the proposed anesthesia with the patient or authorized representative who has indicated his/her understanding and acceptance.   Dental advisory given  Plan Discussed with: CRNA  Anesthesia Plan Comments:         Anesthesia Quick Evaluation

## 2012-06-30 NOTE — H&P (View-Only) (Signed)
Patient ID: [name] [name], female   DOB: 01/12/1941, 70 y.o.   MRN: [id number]  No chief complaint on file.   HPI [name] [name] is a 70 y.o. female. Referred by Dr. Randy Jackson for evaluation of right breast atypical ductal hyperplasia. Her cardiologist is Dr. Tom Wall. Her gastroenterologist is Dr. [name].  HPI This is a 70-year-old female with multiple significant past medical problems including coronary artery disease, nonalcoholic hepatic cirrhosis, diabetes, who presents after a recent abnormal mammogram. She was having no problems prior to a screening mammogram. An abnormality was noted in both breasts. The right side had a cluster of microcalcifications at 9:00 located posteriorly in the breast. This area was biopsied and showed atypical ductal hyperplasia which was negative for malignancy. She had a small area on the left which also underwent stereotactic biopsy but this turned out to be only fibrocystic disease. She is now referred for excision on the right.  The patient has frequent angina but has not seen Dr. Wall in at least 2 years. She also sees Dr. [name] for her nonalcoholic cirrhosis. She is on lactulose to help control her ammonia level. She does have a degree of hepatic encephalopathy.  Past Medical History  Diagnosis Date  . CAD in native artery   . Thrombocytopenia, unspecified   . Unspecified hereditary and idiopathic peripheral neuropathy   . Unspecified fall   . Other and unspecified hyperlipidemia   . Unspecified essential hypertension   . Obesity, unspecified   . Shortness of breath   . Personal history of neurosis   . Type II or unspecified type diabetes mellitus without mention of complication, not stated as uncontrolled   . Intrinsic asthma, unspecified   . Allergic rhinitis, cause unspecified   . Cirrhosis of liver     NASH  . Colon adenomas 03/08/10    tcs by Dr. [name]  . Hyperplastic polyps of stomach 03/08/10    tcs by Dr. Rouk  . Chronic gastritis  03/09/11    egd by Dr. [name]  . History of hemorrhoids 03/08/10    tcs- internal and external  . Diverticula of colon 03/08/10    L side  . Hiatal hernia 03/08/10  . Esophagitis, erosive 03/08/10  . GERD (gastroesophageal reflux disease)     Past Surgical History  Procedure Date  . Hysterectomy and btl     s/p  . Abdominal hysterectomy   . Tumor excision     rt arm and left foot  . Colonoscopy 03/08/10    Dr. [name]-->ext/int hemorrhoids, anal paipilla, rectal polyps, desc polyps, cecal polyp, left-sided diverticula. suboptiomal prep. next TCS 02/2015. multiple adenomas  . Esophagogastroduodenoscopy 03/08/10    Dr. [name]-->mild erosive RE, small hh, antral erosions, small 1cm are of mucosal indentation along gastric body of doubtful significance, cystic nodularity of hypophyarynx, base of tongue, base of epiglottis. benign gastric biopsies  . Tubal ligation   . Esophagogastroduodenoscopy 10/08/2011    no esophageal varices, antral erosion. next egd 09/2013  . Foot surgery   . Breast surgery     Family History  Problem Relation Age of Onset  . Coronary artery disease      FH  . Diabetes      FH  . Heart disease Mother   . Colon cancer Neg Hx     Social History History  Substance Use Topics  . Smoking status: Never Smoker   . Smokeless tobacco: Not on file  . Alcohol Use: No      Allergies  Allergen Reactions  . Prochlorperazine Edisylate Other (See Comments)    Causes anxiety/numbness  . Compazine (Prochlorperazine Edisylate) Anxiety    Causes anxiety & nervousness    Current Outpatient Prescriptions  Medication Sig Dispense Refill  . ALPRAZolam (XANAX) 0.5 MG tablet Take 0.5 mg by mouth 3 (three) times daily. For anxiety      . aspirin (BAYER LOW STRENGTH) 81 MG EC tablet Take 81 mg by mouth daily.        . BAYER CONTOUR TEST test strip       . budesonide-formoterol (SYMBICORT) 160-4.5 MCG/ACT inhaler Inhale 2 puffs into the lungs 2 (two) times daily.        .  clotrimazole (LOTRIMIN) 1 % cream Apply topically 2 (two) times daily.  30 g  0  . enalapril (VASOTEC) 5 MG tablet Take 5 mg by mouth daily.        . esomeprazole (NEXIUM) 40 MG capsule Take 40 mg by mouth daily before breakfast.        . insulin aspart (NOVOLOG) 100 UNIT/ML injection Inject 10-40 Units into the skin daily as needed. Per sliding scale. Patient injects 10-40 units for levels above 150 to 300       . insulin detemir (LEVEMIR) 100 UNIT/ML injection Inject 60 Units into the skin at bedtime.       . lactulose (CHRONULAC) 10 GM/15ML solution Take 60 mLs by mouth daily.       . linagliptin (TRADJENTA) 5 MG TABS tablet Take 5 mg by mouth daily.      . meclizine (ANTIVERT) 25 MG tablet Take 25 mg by mouth 3 (three) times daily as needed. For dizziness/nausea      . Multiple Vitamins-Minerals (CENTRUM SILVER PO) Take 1 tablet by mouth daily.       . oxyCODONE (ROXICODONE) 5 MG immediate release tablet Take 1 tablet (5 mg total) by mouth every 6 (six) hours as needed for pain.  6 tablet  0  . pregabalin (LYRICA) 100 MG capsule Take 150 mg by mouth 2 (two) times daily.       . ciprofloxacin (CIPRO) 500 MG tablet       . GENERLAC 10 GM/15ML SOLN       . lidocaine (XYLOCAINE) 5 % ointment       . traMADol (ULTRAM) 50 MG tablet         Review of Systems Review of Systems  Constitutional: Positive for fatigue. Negative for fever, chills and unexpected weight change.  HENT: Negative for hearing loss, congestion, sore throat, trouble swallowing and voice change.   Eyes: Negative for visual disturbance.  Respiratory: Negative for cough and wheezing.   Cardiovascular: Negative for chest pain, palpitations and leg swelling.  Gastrointestinal: Positive for abdominal distention. Negative for nausea, vomiting, abdominal pain, diarrhea, constipation, blood in stool and anal bleeding.  Genitourinary: Negative for hematuria, vaginal bleeding and difficulty urinating.  Musculoskeletal: Negative for  arthralgias.  Skin: Negative for rash and wound.  Neurological: Positive for dizziness and light-headedness. Negative for seizures, syncope and headaches.  Hematological: Negative for adenopathy. Does not bruise/bleed easily.  Psychiatric/Behavioral: Negative for confusion.    Physical Exam Physical Exam WDWN in NAD She appears mildly unsteady on her feet HEENT:  EOMI, sclera anicteric Neck:  No masses, no thyromegaly Breasts:  Symmetric; healed needle biopsy sites; no palpable masses in either breast; no axillary lymphadenopathy on either side Lungs:  CTA bilaterally; normal respiratory effort CV:  Regular rate and   rhythm; no murmurs Abd:  +bowel sounds, soft, non-tender, no masses Ext:  Well-perfused; no edema Skin:  Warm, dry; no sign of jaundice  Data Reviewed *RADIOLOGY REPORT*  Clinical Data: Screening.  Findings: Two views of each breast demonstrate heterogeneously  dense tissue. In the left breast, calcifications warrant further  imaging evaluation with magnified views. In the right breast,  calcifications warrant further imaging evaluation with magnified  views. Compared with priors.  Images were processed with CAD.  IMPRESSION:  Further imaging evaluation is suggested for calcifications in the  left breast.  Further imaging evaluation is suggested for calcifications in the  right breast.  RECOMMENDATION:  Diagnostic mammogramof both breasts. (Code:FI-B-00M)  BI-RADS CATEGORY 0: Incomplete. Need additional imaging  evaluation and/or prior mammograms for comparison.  Original Report Authenticated By: ELIZABETH D. BROWN, M.D.  *RADIOLOGY REPORT*  Clinical Data: Abnormal bilateral screening mammogram for  calcifications  DIGITAL DIAGNOSTIC BILATERAL MAMMOGRAM  Comparison: Analogue mammograms dated 07/29/2007  Findings: Magnification views of the upper outer quadrant of the  right breast were performed. Far posteriorly there is a developing  cluster of  calcifications spanning an area of approximately 2.5 cm.  Calcifications are mostly punctate but some follow a ductal  pattern. There is no associated mass. Magnification views of the  left upper outer quadrant demonstrate a cluster of calcifications  spanning an area of approximately 5 mm. These are round with no  linear forms and are felt to likely be benign.  IMPRESSION:  1. Suspicious right breast calcifications. We discussed surgical  excision of the calcifications. The patient would like to attempt  a stereotactic biopsy. We discussed the procedure may be difficult  secondary to the far posterior location of calcifications but this  will [facility]  on 05/06/2012.  2. Probable benign calcifications in the left breast. Biopsy of  the right breast calcifications will be performed and if they are  benign than I would recommend short-term interval follow-up left  mammogram in 6 months.  BI-RADS CATEGORY 4: Suspicious abnormality - biopsy should be  considered.  Original Report Authenticated By: DINA L. ARCEO, M.D.      F. [place] Jackson, MD Fri May 09, 2012 10:20:18 AM EDT       **ADDENDUM** CREATED: 05/09/2012 10:15:22  The pathology associated with the right breast stereotactic core  biopsy demonstrates atypical ductal hyperplasia. Surgical excision  is recommended. I have discussed the findings by telephone with  the patient and answered her questions. The patient states her  biopsy site is clean and dry without hematoma formation. Surgical  consultation has been arranged with Dr. Ashanta Amoroso on 06/04/2012. Prior  to this consultation, in view of atypical ductal hyperplasia  present on the right sided biopsy, stereotactic core biopsy of the  left sided calcifications is recommended. This has been scheduled  for 05/21/2012. The patient was encouraged to call the [facility] for additional questions or concerns.  Addended by: Kent  Jackson, M.D. on 05/09/2012 10:15:22.  **END ADDENDUM** SIGNED BY: [place] Jackson, M.D.      Study Result     *RADIOLOGY REPORT*  Clinical Data: Right breast calcifications.  STEREOTACTIC-GUIDED VACUUM ASSISTED BIOPSY OF THE RIGHT BREAST AND  SPECIMEN RADIOGRAPH  Comparison: Previous exams  I met with the patient and we discussed the procedure of  stereotactic-guided biopsy, including benefits and alternatives.  We discussed the high likelihood of a successful procedure. We  discussed the risks of the procedure, including infection,    bleeding, tissue injury, clip migration, and inadequate sampling.  Informed, written consent was given.  Using sterile technique, 2% lidocaine, stereotactic guidance, and a  9 gauge vacuum assisted device, biopsy was performed of the  calcifications located posteriorly and slightly laterally within  the right breast at the 9 o'clock position. Specimen radiograph  was performed, showing representative calcifications within the  specimen. Specimens with calcifications are identified for  pathology.  At the conclusion of the procedure, a T-shaped tissue marker clip  was deployed into the biopsy cavity. Follow-up 2-view mammogram  confirmed clip to be in appropriate position.  IMPRESSION:  Stereotactic-guided biopsy of right breast calcifications as  discussed above. No apparent complications.  Original Report Authenticated By: [place] JACKSON, M.D.      Assessment    Atypical ductal hyperplasia right breast     Plan    Cardiac clearance by Dr. Wall Recommend right breast needle-localized lumpectomy.  The surgical procedure has been discussed with the patient.  Potential risks, benefits, alternative treatments, and expected outcomes have been explained.  All of the patient's questions at this time have been answered.  The likelihood of reaching the patient's treatment goal is good.  The patient understand the proposed surgical procedure and  wishes to proceed.         Gamaliel Charney K. 06/04/2012, 4:28 PM    

## 2012-06-30 NOTE — Telephone Encounter (Signed)
Patient d/c with pain medication that contains Tylenol, patient has h/o non-alcohol cirrhosis.  Does patient medication need to be changed or okay to continue taking Hydrocodone.  Please advise

## 2012-06-30 NOTE — Op Note (Signed)
Pre-Op Diagnosis: Atypical ductal hyperplasia - right breast Post-op Diagnosis:  Same Procedure performed: Right needle localized lumpectomy  Surgeon:Jandel Patriarca K. Anesthesia: Gen. Endotracheal Indications: This is a 71 year old female with multiple significant past medical problems who presents with abnormal mammogram. She had a cluster of microcalcifications at night clot in the right breast located posteriorly. Core biopsy showed atypical ductal hyperplasia. She presents now for needle localized lumpectomy. Unfortunately the radiologist, Dr. Zella Ball, had difficulty placing the wire and had to come from a lateral approach. The wire traverses almost 13 cm of her breast right on the chest wall.  Description of procedure: The patient was brought to the operating room placed in a supine position on the operating room table. After an adequate level of general anesthesia was obtained, the patient's right breast was prepped with chlor prep and draped in sterile fashion. A timeout was taken to ensure the proper patient proper procedure. We infiltrated the area around the wire with cortisone Marcaine with epinephrine. We made a transversely oriented elliptical incision around the wire. We raised skin flaps for a couple of centimeters of distraction. We then excised a very long cylinder of tissue from the posterior portion of the breast oriented a lateral to medial direction. We went all the way down to the chest wall. Some muscle fibers were taken with the specimen. The specimen is so long to my finger does not reach the end of it. We used a 10 measure to measure 13 cm. Once we had reached this area I was able to amputate the specimen. The tip of the wire is seen at the distal and of the specimen. Specimen mammogram shows that the previous biopsy clip is within the specimen. However is unclear whether all the microcalcifications have been excised. I cannot palpate any masses within the biopsy cavity as the  cavity is far too long to palpate the distal end of it. We irrigated thoroughly and cauterized for hemostasis. The wound was closed with a deep layer of 3-0 Vicryl. 4 Monocryl was used to close the skin. Steri-Strips and clean dressings were applied. The patient was extubated and brought to recovery in stable condition. All sponge, initially, and needle counts are correct.  Wilmon Arms. Corliss Skains, MD, [facility] Surgery  06/30/2012 2:52 PM

## 2012-06-30 NOTE — Interval H&P Note (Signed)
History and Physical Interval Note:  06/30/2012 12:48 PM  [name] [name]  has presented today for surgery, with the diagnosis of Right breast atypical ductal hyperplasia  The various methods of treatment have been discussed with the patient and family. After consideration of risks, benefits and other options for treatment, the patient has consented to  Procedure(s) (LRB): BREAST LUMPECTOMY WITH NEEDLE LOCALIZATION (Right) as a surgical intervention .  The patient's history has been reviewed, patient examined, no change in status, stable for surgery.  I have reviewed the patient's chart and labs.  Questions were answered to the patient's satisfaction.     Alpa Salvo K.

## 2012-06-30 NOTE — Transfer of Care (Signed)
Immediate Anesthesia Transfer of Care Note  Patient: [name] [name]  Procedure(s) Performed: Procedure(s) (LRB): BREAST LUMPECTOMY WITH NEEDLE LOCALIZATION (Right)  Patient Location: PACU  Anesthesia Type: General  Level of Consciousness: awake  Airway & Oxygen Therapy: Patient Spontanous Breathing and Patient connected to face mask oxygen  Post-op Assessment: Report given to PACU RN and Post -op Vital signs reviewed and stable  Post vital signs: Reviewed and stable  Complications: No apparent anesthesia complications

## 2012-06-30 NOTE — Anesthesia Procedure Notes (Addendum)
Procedure Name: Intubation Performed by: York Grice Pre-anesthesia Checklist: Patient identified, Timeout performed, Emergency Drugs available, Suction available and Patient being monitored Patient Re-evaluated:Patient Re-evaluated prior to inductionOxygen Delivery Method: Circle system utilized   Performed by: York Grice Preoxygenation: Pre-oxygenation with 100% oxygen Intubation Type: IV induction Ventilation: Mask ventilation without difficulty Laryngoscope Size: Miller and 2 Grade View: Grade I Tube type: Oral Number of attempts: 1 Airway Equipment and Method: Stylet Placement Confirmation: ETT inserted through vocal cords under direct vision,  breath sounds checked- equal and bilateral and positive ETCO2 Secured at: 21 cm Tube secured with: Tape Dental Injury: Teeth and Oropharynx as per pre-operative assessment

## 2012-07-01 ENCOUNTER — Telehealth (INDEPENDENT_AMBULATORY_CARE_PROVIDER_SITE_OTHER): Payer: Self-pay | Admitting: General Surgery

## 2012-07-01 NOTE — Telephone Encounter (Signed)
If she can just get by with taking her Ultram, that would be best.  If she needs something stronger, she will have to come by to get a written prescription for Oxycodone IR 5 mg PO q4-6 PRN #20 no refills.  Somebody else will have to write it.

## 2012-07-01 NOTE — Telephone Encounter (Signed)
Patient called in asking if prescription that she asked for has been issued. Advised that the prescription was written out and at the front desk to be picked up. Patient then advised that her husband went to the pharmacy yesterday and already picked up the prescription. Advised her that this prescription could not have been filled since it was not even picked up yet, it was just written this morning. The patient sounded confused.

## 2012-07-01 NOTE — Telephone Encounter (Signed)
Called pt this morning told her that we have her a written RX for her at the check in desk. I also made her a appt to come back in to see Dr Corliss Skains

## 2012-07-03 ENCOUNTER — Telehealth (INDEPENDENT_AMBULATORY_CARE_PROVIDER_SITE_OTHER): Payer: Self-pay | Admitting: General Surgery

## 2012-07-03 NOTE — Telephone Encounter (Signed)
Called pt back and told her that the pathology dr is running more test on her path and we will give her a call once we get it back

## 2012-07-15 ENCOUNTER — Encounter (INDEPENDENT_AMBULATORY_CARE_PROVIDER_SITE_OTHER): Payer: Self-pay | Admitting: Surgery

## 2012-07-15 ENCOUNTER — Ambulatory Visit (INDEPENDENT_AMBULATORY_CARE_PROVIDER_SITE_OTHER): Payer: Medicare Other | Admitting: Surgery

## 2012-07-15 VITALS — BP 150/88 | HR 74 | Temp 96.6°F | Resp 18 | Ht 68.0 in | Wt 254.0 lb

## 2012-07-15 DIAGNOSIS — N6089 Other benign mammary dysplasias of unspecified breast: Secondary | ICD-10-CM

## 2012-07-15 DIAGNOSIS — N6099 Unspecified benign mammary dysplasia of unspecified breast: Secondary | ICD-10-CM

## 2012-07-15 NOTE — Progress Notes (Signed)
This patient underwent right needle localized lumpectomy on 06/30/12. Pathology showed only atypical ductal hyperplasia with no sign of malignancy. Her incision is well-healed with no sign of infection or seroma. She still some soreness at the edge for pectoralis muscle. This is likely due to the fact that the wire localization ran through the edge of her pectoralis. She still is full range of motion of her arm and shoulder. I encouraged her to use anti-inflammatory medication such as ibuprofen or Aleve and to continue moving that shoulder. This should get better over time. Otherwise she should continue annual mammograms. Follow-up when necessary.  [name] [name]. Corliss Skains, MD, [facility] Surgery  07/15/2012 4:14 PM

## 2012-07-21 ENCOUNTER — Encounter: Payer: Self-pay | Admitting: Internal Medicine

## 2012-07-25 ENCOUNTER — Ambulatory Visit (HOSPITAL_COMMUNITY)
Admission: RE | Admit: 2012-07-25 | Discharge: 2012-07-25 | Disposition: A | Discharge status: Home / Self Care | Payer: Medicare Other | Source: Ambulatory Visit | Attending: Family Medicine | Admitting: Family Medicine

## 2012-07-25 ENCOUNTER — Other Ambulatory Visit (HOSPITAL_COMMUNITY): Payer: Self-pay | Admitting: Family Medicine

## 2012-07-25 DIAGNOSIS — M545 Low back pain, unspecified: Secondary | ICD-10-CM

## 2012-08-07 ENCOUNTER — Encounter (HOSPITAL_COMMUNITY): Payer: Self-pay

## 2012-08-07 ENCOUNTER — Emergency Department (HOSPITAL_COMMUNITY): Payer: Medicare Other

## 2012-08-07 ENCOUNTER — Inpatient Hospital Stay (HOSPITAL_COMMUNITY)
Admission: EM | Admit: 2012-08-07 | Discharge: 2012-08-09 | DRG: 093 | Disposition: A | Discharge status: Home / Self Care | Payer: Medicare Other | Attending: Internal Medicine | Admitting: Internal Medicine

## 2012-08-07 DIAGNOSIS — K449 Diaphragmatic hernia without obstruction or gangrene: Secondary | ICD-10-CM | POA: Diagnosis present

## 2012-08-07 DIAGNOSIS — I2510 Atherosclerotic heart disease of native coronary artery without angina pectoris: Secondary | ICD-10-CM | POA: Diagnosis present

## 2012-08-07 DIAGNOSIS — I10 Essential (primary) hypertension: Secondary | ICD-10-CM | POA: Diagnosis present

## 2012-08-07 DIAGNOSIS — G629 Polyneuropathy, unspecified: Secondary | ICD-10-CM | POA: Diagnosis present

## 2012-08-07 DIAGNOSIS — Z6838 Body mass index (BMI) 38.0-38.9, adult: Secondary | ICD-10-CM

## 2012-08-07 DIAGNOSIS — T424X5A Adverse effect of benzodiazepines, initial encounter: Secondary | ICD-10-CM | POA: Diagnosis present

## 2012-08-07 DIAGNOSIS — E860 Dehydration: Secondary | ICD-10-CM | POA: Diagnosis present

## 2012-08-07 DIAGNOSIS — Z888 Allergy status to other drugs, medicaments and biological substances status: Secondary | ICD-10-CM

## 2012-08-07 DIAGNOSIS — K219 Gastro-esophageal reflux disease without esophagitis: Secondary | ICD-10-CM | POA: Diagnosis present

## 2012-08-07 DIAGNOSIS — W19XXXA Unspecified fall, initial encounter: Secondary | ICD-10-CM

## 2012-08-07 DIAGNOSIS — E119 Type 2 diabetes mellitus without complications: Secondary | ICD-10-CM

## 2012-08-07 DIAGNOSIS — Z8249 Family history of ischemic heart disease and other diseases of the circulatory system: Secondary | ICD-10-CM

## 2012-08-07 DIAGNOSIS — Z9181 History of falling: Secondary | ICD-10-CM

## 2012-08-07 DIAGNOSIS — Z820 Family history of epilepsy and other diseases of the nervous system: Secondary | ICD-10-CM

## 2012-08-07 DIAGNOSIS — R42 Dizziness and giddiness: Secondary | ICD-10-CM

## 2012-08-07 DIAGNOSIS — Y92009 Unspecified place in unspecified non-institutional (private) residence as the place of occurrence of the external cause: Secondary | ICD-10-CM

## 2012-08-07 DIAGNOSIS — G609 Hereditary and idiopathic neuropathy, unspecified: Secondary | ICD-10-CM | POA: Diagnosis present

## 2012-08-07 DIAGNOSIS — E669 Obesity, unspecified: Secondary | ICD-10-CM | POA: Diagnosis present

## 2012-08-07 DIAGNOSIS — Z79899 Other long term (current) drug therapy: Secondary | ICD-10-CM

## 2012-08-07 DIAGNOSIS — J45909 Unspecified asthma, uncomplicated: Secondary | ICD-10-CM | POA: Diagnosis present

## 2012-08-07 DIAGNOSIS — R270 Ataxia, unspecified: Secondary | ICD-10-CM | POA: Diagnosis present

## 2012-08-07 DIAGNOSIS — R410 Disorientation, unspecified: Secondary | ICD-10-CM

## 2012-08-07 DIAGNOSIS — T40605A Adverse effect of unspecified narcotics, initial encounter: Secondary | ICD-10-CM | POA: Diagnosis present

## 2012-08-07 DIAGNOSIS — D696 Thrombocytopenia, unspecified: Secondary | ICD-10-CM | POA: Diagnosis present

## 2012-08-07 DIAGNOSIS — Z8719 Personal history of other diseases of the digestive system: Secondary | ICD-10-CM

## 2012-08-07 DIAGNOSIS — R296 Repeated falls: Secondary | ICD-10-CM

## 2012-08-07 DIAGNOSIS — R269 Unspecified abnormalities of gait and mobility: Principal | ICD-10-CM | POA: Diagnosis present

## 2012-08-07 DIAGNOSIS — Z794 Long term (current) use of insulin: Secondary | ICD-10-CM

## 2012-08-07 DIAGNOSIS — K7460 Unspecified cirrhosis of liver: Secondary | ICD-10-CM

## 2012-08-07 DIAGNOSIS — E785 Hyperlipidemia, unspecified: Secondary | ICD-10-CM | POA: Diagnosis present

## 2012-08-07 DIAGNOSIS — Z7982 Long term (current) use of aspirin: Secondary | ICD-10-CM

## 2012-08-07 DIAGNOSIS — Z90710 Acquired absence of both cervix and uterus: Secondary | ICD-10-CM

## 2012-08-07 LAB — URINALYSIS, ROUTINE W REFLEX MICROSCOPIC
Glucose, UA: 500 mg/dL — AB
Leukocytes, UA: NEGATIVE
Protein, ur: NEGATIVE mg/dL
Specific Gravity, Urine: 1.025 (ref 1.005–1.030)
Urobilinogen, UA: 2 mg/dL — ABNORMAL HIGH (ref 0.0–1.0)

## 2012-08-07 LAB — RAPID URINE DRUG SCREEN, HOSP PERFORMED
Amphetamines: NOT DETECTED
Cocaine: NOT DETECTED
Opiates: NOT DETECTED
Tetrahydrocannabinol: NOT DETECTED

## 2012-08-07 MED ORDER — SODIUM CHLORIDE 0.9 % IV SOLN
INTRAVENOUS | Status: DC
  Administered 2012-08-07: via INTRAVENOUS

## 2012-08-07 NOTE — ED Provider Notes (Signed)
History   This chart was scribed for Flint Melter, MD by Albertha Ghee Rifaie. This patient was seen in room APA08/APA08 and the patient's care was started at 22:37.  CSN: [id number]  Arrival date & time 08/07/12  2111   First MD Initiated Contact with Patient 08/07/12 2237      Chief Complaint  Patient presents with  . Dizziness  . Fall    The history is provided by the patient and a relative. No language interpreter was used.    [name] [name] is a 71 y.o. female who presents to the Emergency Department complaining of a fall into the floor and hit her head and leg. Pt reports falling frequently; Pt is worried that her ammonia level is high. Pt denies having fluids drained from her stomach. Pt also c/o dizziness. The daughter reports the pt has had episodes of confusion and states that the pt is confused now. Pt states that she has been having trouble walking and doesn't use a walker to help her ambulate. The pt has a h/o cirrhosis of the liver that is associated with high level of ammonia. Pt denies smoking and alcohol use.    Past Medical History  Diagnosis Date  . CAD in native artery   . Thrombocytopenia, unspecified   . Unspecified hereditary and idiopathic peripheral neuropathy   . Unspecified fall   . Other and unspecified hyperlipidemia   . Unspecified essential hypertension   . Obesity, unspecified   . Shortness of breath   . Personal history of neurosis   . Intrinsic asthma, unspecified   . Allergic rhinitis, cause unspecified   . Cirrhosis of liver     NA[facility]  . Colon adenomas 03/08/10    tcs by Dr. Jena Gauss  . Hyperplastic polyps of stomach 03/08/10    tcs by Dr. Geni Bers  . Chronic gastritis 03/09/11    egd by Dr. Jena Gauss  . History of hemorrhoids 03/08/10    tcs- internal and external  . Diverticula of colon 03/08/10    L side  . Hiatal hernia 03/08/10  . Esophagitis, erosive 03/08/10  . GERD (gastroesophageal reflux disease)   . Wears glasses   . Type II or  unspecified type diabetes mellitus without mention of complication, not stated as uncontrolled     Past Surgical History  Procedure Date  . Hysterectomy and btl     s/p  . Abdominal hysterectomy   . Tumor excision 2003    rt arm and left foot  . Colonoscopy 03/08/10    Dr. [name]-->ext/int hemorrhoids, anal paipilla, rectal polyps, desc polyps, cecal polyp, left-sided diverticula. suboptiomal prep. next TCS 02/2015. multiple adenomas  . Esophagogastroduodenoscopy 03/08/10    Dr. Dellie Catholic erosive RE, small hh, antral erosions, small 1cm are of mucosal indentation along gastric body of doubtful significance, cystic nodularity of hypophyarynx, base of tongue, base of epiglottis. benign gastric biopsies  . Tubal ligation   . Esophagogastroduodenoscopy 10/08/2011    no esophageal varices, antral erosion. next egd 09/2013  . Foot surgery 2003    lt foot  . Breast surgery   . Eye surgery     cataracts    Family History  Problem Relation Age of Onset  . Coronary artery disease      FH  . Diabetes      FH  . Heart disease Mother   . Colon cancer Neg Hx     History  Substance Use Topics  . Smoking status: Never Smoker   .  Smokeless tobacco: Not on file  . Alcohol Use: No    OB History    Grav Para Term Preterm Abortions TAB SAB Ect Mult Living                  Review of Systems  All other systems reviewed and are negative.    Allergies  Prochlorperazine edisylate and Compazine  Home Medications   Current Outpatient Rx  Name Route Sig Dispense Refill  . ALPRAZOLAM 0.5 MG PO TABS Oral Take 0.5 mg by mouth 3 (three) times daily. For anxiety    . ASPIRIN 81 MG PO TBEC Oral Take 81 mg by mouth daily.      . BUDESONIDE-FORMOTEROL FUMARATE 160-4.5 MCG/ACT IN AERO Inhalation Inhale 2 puffs into the lungs 2 (two) times daily.      Marland Kitchen CLOTRIMAZOLE 1 % EX CREA Topical Apply topically 2 (two) times daily. 30 g 0  . ENALAPRIL MALEATE 5 MG PO TABS Oral Take 5 mg by mouth daily.       Marland Kitchen ESOMEPRAZOLE MAGNESIUM 40 MG PO CPDR Oral Take 40 mg by mouth daily before breakfast.      . GENERLAC 10 GM/15ML PO SOLN Oral Take 10 g by mouth daily.     Marland Kitchen HYDROCODONE-ACETAMINOPHEN 5-325 MG PO TABS Oral Take 1 tablet by mouth every 4 (four) hours as needed. For pain    . INSULIN ASPART 100 UNIT/ML [place] SOLN Subcutaneous Inject 10-40 Units into the skin daily as needed. Per sliding scale. Patient injects 10-40 units for levels above 150 to 300     . INSULIN DETEMIR 100 UNIT/ML [place] SOLN Subcutaneous Inject 60 Units into the skin at bedtime.     Marland Kitchen LACTULOSE 10 GM/15ML PO SOLN Oral Take 60 mLs by mouth daily.     Marland Kitchen LINAGLIPTIN 5 MG PO TABS Oral Take 5 mg by mouth daily.    . CENTRUM SILVER PO Oral Take 1 tablet by mouth daily.     Marland Kitchen PREGABALIN 100 MG PO CAPS Oral Take 100 mg by mouth 2 (two) times daily.     Marland Kitchen MECLIZINE HCL 25 MG PO TABS Oral Take 25 mg by mouth 3 (three) times daily as needed. For dizziness/nausea      BP 136/53  Pulse 82  Temp 98.4 F (36.9 C) (Oral)  Resp 20  Ht 5' 7.5" (1.715 m)  Wt 250 lb (113.399 kg)  BMI 38.58 kg/m2  SpO2 100%  Physical Exam  Nursing note and vitals reviewed. Constitutional: She is oriented to person, place, and time. She appears well-developed and well-nourished. No distress.  HENT:  Head: Normocephalic and atraumatic.  Eyes: Conjunctivae normal and EOM are normal.  Neck: Neck supple. No tracheal deviation present.  Cardiovascular: Normal rate, regular rhythm and normal heart sounds.   Pulmonary/Chest: Effort normal. No respiratory distress.  Abdominal: She exhibits no distension.       Mild upper abd tenderness   Musculoskeletal: Normal range of motion.       Mild right upper back tenderness   Neurological: She is alert and oriented to person, place, and time. No sensory deficit.  Skin: Skin is dry.  Psychiatric: She has a normal mood and affect. Her behavior is normal.    ED Course  Procedures (including critical care  time)  DIAGNOSTIC STUDIES: Oxygen Saturation is 100% on room air, normal by my interpretation.    COORDINATION OF CARE: 10:50PM Discussed treatment plan with pt that includes ordering a CT scan  at bedside and pt agreed to plan.     Labs Reviewed  URINALYSIS, ROUTINE W REFLEX MICROSCOPIC - Abnormal; Notable for the following:    Glucose, UA 500 (*)     Urobilinogen, UA 2.0 (*)     All other components within normal limits  URINE RAPID DRUG SCREEN (HOSP PERFORMED)  COMPREHENSIVE METABOLIC PANEL  CBC WITH DIFFERENTIAL  LIPASE, BLOOD  URINE CULTURE  AMMONIA  ETHANOL  PROTIME-INR   Ct Head Wo Contrast  08/07/2012  *RADIOLOGY REPORT*  Clinical Data: Fall.  Head trauma.  Confusion.  CT HEAD WITHOUT CONTRAST  Technique:  Contiguous axial images were obtained from the base of the skull through the vertex without contrast.  Comparison: 01/25/2010 head CT.  Findings: No mass lesion, mass effect, midline shift, hydrocephalus, hemorrhage.  No acute territorial cortical ischemia/infarct. Atrophy and chronic ischemic white matter disease is present.  Paranasal sinuses clear.  IMPRESSION: Negative CT head.   Original Report Authenticated By: Andreas Newport, M.D.      1. Fall   2. Confusion   3. Cirrhosis       MDM  Mild confusion, and fall. No clear cause on clinical exam. CT negative for intracranial injury or CVA. Neurologic exam does not support a stroke diagnosis.      I personally performed the services described in this documentation, which was scribed in my presence. The recorded information has been reviewed and considered.     00:25- patient's initial blood was hemolyzed and required, redrawn. Care to Dr. Bebe Shaggy for disposition after labs returned    Flint Melter, MD 08/08/12 [phone]

## 2012-08-07 NOTE — ED Notes (Signed)
Falling and dizzy. History of cirrhosis of the liver and this happens when her ammonia levels get to high per family.

## 2012-08-08 ENCOUNTER — Encounter (HOSPITAL_COMMUNITY): Payer: Self-pay | Admitting: Internal Medicine

## 2012-08-08 ENCOUNTER — Observation Stay (HOSPITAL_COMMUNITY): Payer: Medicare Other

## 2012-08-08 DIAGNOSIS — E119 Type 2 diabetes mellitus without complications: Secondary | ICD-10-CM

## 2012-08-08 DIAGNOSIS — W19XXXA Unspecified fall, initial encounter: Secondary | ICD-10-CM

## 2012-08-08 DIAGNOSIS — R270 Ataxia, unspecified: Secondary | ICD-10-CM | POA: Diagnosis present

## 2012-08-08 DIAGNOSIS — R279 Unspecified lack of coordination: Secondary | ICD-10-CM

## 2012-08-08 DIAGNOSIS — R296 Repeated falls: Secondary | ICD-10-CM

## 2012-08-08 DIAGNOSIS — G629 Polyneuropathy, unspecified: Secondary | ICD-10-CM | POA: Diagnosis present

## 2012-08-08 DIAGNOSIS — K7460 Unspecified cirrhosis of liver: Secondary | ICD-10-CM

## 2012-08-08 LAB — CBC WITH DIFFERENTIAL/PLATELET
Basophils Relative: 1 % (ref 0–1)
HCT: 39.1 % (ref 36.0–46.0)
Hemoglobin: 13.5 g/dL (ref 12.0–15.0)
Lymphocytes Relative: 39 % (ref 12–46)
MCHC: 34.5 g/dL (ref 30.0–36.0)
Monocytes Relative: 7 % (ref 3–12)
Neutro Abs: 1.8 10*3/uL (ref 1.7–7.7)
Neutrophils Relative %: 51 % (ref 43–77)
RBC: 4.68 MIL/uL (ref 3.87–5.11)
WBC: 3.4 10*3/uL — ABNORMAL LOW (ref 4.0–10.5)

## 2012-08-08 LAB — COMPREHENSIVE METABOLIC PANEL
ALT: 11 U/L (ref 0–35)
BUN: 11 mg/dL (ref 6–23)
CO2: 23 mEq/L (ref 19–32)
Calcium: 9 mg/dL (ref 8.4–10.5)
Creatinine, Ser: 0.68 mg/dL (ref 0.50–1.10)
GFR calc Af Amer: >90 mL/min (ref >90)
GFR calc non Af Amer: 87 mL/min — ABNORMAL LOW (ref >90)
Glucose, Bld: 227 mg/dL — ABNORMAL HIGH (ref 70–99)
Total Protein: 5.5 g/dL — ABNORMAL LOW (ref 6.0–8.3)

## 2012-08-08 LAB — GLUCOSE, CAPILLARY: Glucose-Capillary: 249 mg/dL — ABNORMAL HIGH (ref 70–99)

## 2012-08-08 LAB — URINE CULTURE: Colony Count: NO GROWTH

## 2012-08-08 MED ORDER — SODIUM CHLORIDE 0.9 % IJ SOLN
3.0000 mL | Freq: Two times a day (BID) | INTRAMUSCULAR | Status: DC
  Administered 2012-08-08 – 2012-08-09 (×2): 3 mL via INTRAVENOUS
  Filled 2012-08-08 (×2): qty 3

## 2012-08-08 MED ORDER — ALBUTEROL SULFATE (5 MG/ML) 0.5% IN NEBU: 2.5000 mg | INHALATION_SOLUTION | RESPIRATORY_TRACT | Status: DC | PRN

## 2012-08-08 MED ORDER — INSULIN DETEMIR 100 UNIT/ML ~~LOC~~ SOLN
60.0000 [IU] | Freq: Every day | SUBCUTANEOUS | Status: DC
  Administered 2012-08-08: 60 [IU] via SUBCUTANEOUS
  Filled 2012-08-08: qty 10

## 2012-08-08 MED ORDER — HYDROCODONE-ACETAMINOPHEN 5-325 MG PO TABS: 1.0000 | ORAL_TABLET | ORAL | Status: DC | PRN

## 2012-08-08 MED ORDER — INSULIN ASPART 100 UNIT/ML ~~LOC~~ SOLN
0.0000 [IU] | Freq: Every day | SUBCUTANEOUS | Status: DC
  Administered 2012-08-08: 2 [IU] via SUBCUTANEOUS

## 2012-08-08 MED ORDER — PANTOPRAZOLE SODIUM 40 MG PO TBEC
40.0000 mg | DELAYED_RELEASE_TABLET | Freq: Every day | ORAL | Status: DC
  Administered 2012-08-08 – 2012-08-09 (×2): 40 mg via ORAL
  Filled 2012-08-08 (×2): qty 1

## 2012-08-08 MED ORDER — ASPIRIN EC 81 MG PO TBEC
81.0000 mg | DELAYED_RELEASE_TABLET | Freq: Every day | ORAL | Status: DC
  Administered 2012-08-08 – 2012-08-09 (×2): 81 mg via ORAL
  Filled 2012-08-08 (×2): qty 1

## 2012-08-08 MED ORDER — ENALAPRIL MALEATE 5 MG PO TABS
5.0000 mg | ORAL_TABLET | Freq: Every day | ORAL | Status: DC
  Administered 2012-08-08 – 2012-08-09 (×2): 5 mg via ORAL
  Filled 2012-08-08 (×2): qty 1

## 2012-08-08 MED ORDER — ONDANSETRON HCL 4 MG/2ML IJ SOLN: 4.0000 mg | Freq: Four times a day (QID) | INTRAMUSCULAR | Status: DC | PRN

## 2012-08-08 MED ORDER — PREGABALIN 50 MG PO CAPS
100.0000 mg | ORAL_CAPSULE | Freq: Two times a day (BID) | ORAL | Status: DC
  Administered 2012-08-08 – 2012-08-09 (×3): 100 mg via ORAL
  Filled 2012-08-08 (×3): qty 2

## 2012-08-08 MED ORDER — ALPRAZOLAM 0.5 MG PO TABS
0.5000 mg | ORAL_TABLET | Freq: Three times a day (TID) | ORAL | Status: DC | PRN
  Administered 2012-08-08: 0.5 mg via ORAL
  Filled 2012-08-08: qty 1

## 2012-08-08 MED ORDER — LACTULOSE 10 GM/15ML PO SOLN
40.0000 g | Freq: Every day | ORAL | Status: DC
  Administered 2012-08-08 – 2012-08-09 (×2): 40 g via ORAL
  Filled 2012-08-08 (×2): qty 60

## 2012-08-08 MED ORDER — ONDANSETRON HCL 4 MG PO TABS: 4.0000 mg | ORAL_TABLET | Freq: Four times a day (QID) | ORAL | Status: DC | PRN

## 2012-08-08 MED ORDER — SODIUM CHLORIDE 0.9 % IV SOLN
INTRAVENOUS | Status: AC
  Administered 2012-08-08: 12:00:00 via INTRAVENOUS

## 2012-08-08 MED ORDER — BUDESONIDE-FORMOTEROL FUMARATE 160-4.5 MCG/ACT IN AERO
2.0000 | INHALATION_SPRAY | Freq: Two times a day (BID) | RESPIRATORY_TRACT | Status: DC
  Administered 2012-08-08 – 2012-08-09 (×2): 2 via RESPIRATORY_TRACT
  Filled 2012-08-08: qty 6

## 2012-08-08 MED ORDER — INSULIN ASPART 100 UNIT/ML ~~LOC~~ SOLN
0.0000 [IU] | Freq: Three times a day (TID) | SUBCUTANEOUS | Status: DC
  Administered 2012-08-08: 09:00:00 via SUBCUTANEOUS
  Administered 2012-08-08: 11 [IU] via SUBCUTANEOUS
  Administered 2012-08-08 – 2012-08-09 (×2): 5 [IU] via SUBCUTANEOUS

## 2012-08-08 NOTE — ED Notes (Addendum)
Ambulated patient with assistance from ER Tech. Patient unsteady on her feet. States that she feels dizzy and weak. Patient swaying from side to side and back and forth at times. Patient returned to the bed.  Family concerned that she will fall again if taken back home. Family states that she has not been eating right and she has not been managing her diabetes well. Patient states that she has abdominal cramping all the time due to taking the lactulose that her family doctor has her on. Patient states that she tries not to eat so she will not have the abdominal cramping. She has had several episodes of being incontinent of stool due to the medication. MD notified.

## 2012-08-08 NOTE — Evaluation (Signed)
Occupational Therapy Evaluation Patient Details Name: AUNISTY EGGEBRECHT MRN: [id number] DOB: 08-21-1941 Today's Date: 08/08/2012 Time: 1345-1400 OT Time Calculation (min): 15 min  OT Assessment / Plan / Recommendation Clinical Impression  Patient is a 71 y/o female s/p Ataxia presenting to acute OT with all education complete. Patient is functioning at baseline for bathing and dressing. Patient reports that husband is with her at all times for supervision. Recommended patient intall grab bars and buy a tub bench for showers to increase safety. Patient was given red theraband and verbalized HEP. Patient verbalized and demonstrated understanding.    OT Assessment  Patient does not need any further OT services    Follow Up Recommendations  No OT follow up       Equipment Recommendations  Tub/shower seat;Other (comment) (grab bars in shower)          Precautions / Restrictions Precautions Precautions: Fall Restrictions Weight Bearing Restrictions: No   Pertinent Vitals/Pain No report of pain.    ADL  Lower Body Dressing: Performed;Supervision/safety Where Assessed - Lower Body Dressing: Supported sit to stand Toilet Transfer: Research scientist (life sciences) Method: Surveyor, minerals: Other (comment) (to recliner)      Visit Information  Last OT Received On: 08/08/12 Assistance Needed: +1    Subjective Data  Subjective: "My husband is always with me." Patient Stated Goal: To go home.   Prior Functioning     Home Living Lives With: Spouse Available Help at Discharge: Family;Available 24 hours/day Type of Home: House Home Access: Stairs to enter [employer] of Steps: 4 Entrance Stairs-Rails: Right Home Layout: One level Bathroom Shower/Tub: Engineer, manufacturing systems: Standard Home Adaptive Equipment: Bedside commode/3-in-1;Walker - rolling;Straight cane Prior Function Level of Independence: Needs assistance Needs  Assistance: Bathing;Dressing;Light Housekeeping;Meal Prep Bath: Supervision/set-up Dressing: Supervision/set-up Meal Prep: Total Light Housekeeping: Total Able to Take Stairs?: Yes Driving: No Vocation: Retired Musician: No difficulties Dominant Hand: Right         Vision/Perception   Cognition  Overall Cognitive Status: Appears within functional limits for tasks assessed/performed Arousal/Alertness: Awake/alert Orientation Level: Appears intact for tasks assessed Behavior During Session: [facility] for tasks performed    Extremity/Trunk Assessment Right Upper Extremity Assessment RUE ROM/Strength/Tone: Deficits RUE ROM/Strength/Tone Deficits: A/ROM WFL in all ranges. MMT: shoulder flexion: 4/5; elbow flexion/extension: 5/5 Left Upper Extremity Assessment LUE ROM/Strength/Tone: Deficits LUE ROM/Strength/Tone Deficits: A/ROM WFL in all ranges. MMT: shoulder flexon: 4-/5; elbow flexion/extension: 4-/5     Mobility Bed Mobility Bed Mobility: Rolling Left;Rolling Right Rolling Right: 6: Modified independent (Device/Increase time);With rail Rolling Left: 6: Modified independent (Device/Increase time);With rail Supine to Sit: HOB elevated;5: Supervision Transfers Transfers: Stand to Sit;Sit to Stand Sit to Stand: 5: Supervision;With upper extremity assist;From bed Stand to Sit: 5: Supervision;With upper extremity assist;To chair/3-in-1        Exercise General Exercises - Upper Extremity Shoulder Flexion: Strengthening;Both;10 reps;Theraband;Seated Theraband Level (Shoulder Flexion): Level 2 (Red) Shoulder Extension: Strengthening;Both;10 reps;Seated;Theraband Theraband Level (Shoulder Extension): Level 2 (Red) Shoulder Horizontal ABduction: Strengthening;10 reps;Both;Seated;Theraband Theraband Level (Shoulder Horizontal Abduction): Level 2 (Red) Shoulder Horizontal ADduction: Strengthening;10 reps;Both;Seated;Theraband Theraband Level (Shoulder Horizontal  Adduction): Level 2 (Red)       End of Session OT - End of Session Equipment Utilized During Treatment: Other (comment) (rolling walker) Activity Tolerance: Patient tolerated treatment well Patient left: in bed;with call bell/phone within reach;with family/visitor present   Limmie Patricia, OTR/L 08/08/2012, 2:08 PM

## 2012-08-08 NOTE — Progress Notes (Signed)
     Subjective: This lady came in yesterday because of unsteady gait. She has not really been confused. Since being admitted and given some intravenous fluids, she says she feels much improved now.           Physical Exam: Blood pressure 106/66, pulse 67, temperature 97.5 F (36.4 C), temperature source Oral, resp. rate 18, height 5' 7.5" (1.715 m), weight 112.4 kg (247 lb 12.8 oz), SpO2 95.00%. She looks systemically well. She is not encephalopathic. There are no signs of chronic liver disease. Heart sounds are present and normal. Lung fields are clear. She is alert and orientated without any focal neurological signs.        Basic Metabolic Panel:  Basename 08/08/12 0030  NA 136  K 3.5  CL 106  CO2 23  GLUCOSE 227*  BUN 11  CREATININE 0.68  CALCIUM 9.0  MG --  PHOS --   Liver Function Tests:  Basename 08/08/12 0030  AST 18  ALT 11  ALKPHOS 78  BILITOT 1.6*  PROT 5.5*  ALBUMIN 3.0*     CBC:  Basename 08/08/12 0030  WBC 3.4*  NEUTROABS 1.8  HGB 13.5  HCT 39.1  MCV 83.5  PLT 107*    Ct Head Wo Contrast  08/07/2012  *RADIOLOGY REPORT*  Clinical Data: Fall.  Head trauma.  Confusion.  CT HEAD WITHOUT CONTRAST  Technique:  Contiguous axial images were obtained from the base of the skull through the vertex without contrast.  Comparison: 01/25/2010 head CT.  Findings: No mass lesion, mass effect, midline shift, hydrocephalus, hemorrhage.  No acute territorial cortical ischemia/infarct. Atrophy and chronic ischemic white matter disease is present.  Paranasal sinuses clear.  IMPRESSION: Negative CT head.   Original Report Authenticated By: Andreas Newport, M.D.       Medications: I have reviewed the patient's current medications.  Impression: 1. Ataxia/gait abnormality, unclear etiology although seems to have improved now. 2. Cirrhosis of the liver secondary to [facility]. No encephalopathy clinically. 3. Type 2 diabetes mellitus. 4.  Obesity.     Plan: 1. Cancel MRI brain scan. 2. We will ask physical and occupational therapy to assess this patient for stability. 3. If she remains stable today, I think it is reasonable for her to be discharged home tomorrow.     LOS: 1 day   Wilson Singer Pager [phone]  08/08/2012, 10:02 AM

## 2012-08-08 NOTE — Plan of Care (Signed)
Problem: Phase I Progression Outcomes Goal: OOB as tolerated unless otherwise ordered Outcome: Progressing Ambulating to bathroom with x1 assist

## 2012-08-08 NOTE — Plan of Care (Signed)
Problem: Phase II Progression Outcomes Goal: Discharge plan established Outcome: Completed/Met Date Met:  08/08/12 Plans to discharge back home with husband.

## 2012-08-08 NOTE — ED Provider Notes (Signed)
Assumed patient in signout to f/u on labs Labs unremarkable, but patient has difficulty walking, she can barely get out of bed on her own She is high risk for falls ?occult cerebellar infarct (no focal motor deficits, no arm/leg drift, no facial droop) D/w dr Rito Ehrlich to admit for OBS tele   Date: 08/08/2012  Rate: 70  Rhythm: normal sinus rhythm  QRS Axis: normal  Intervals: PR prolonged  ST/T Wave abnormalities: nonspecific ST changes  Conduction Disutrbances:none  Narrative Interpretation:   Old EKG Reviewed: unchanged    Joya Gaskins, MD 08/08/12 [phone]

## 2012-08-08 NOTE — Progress Notes (Signed)
Inpatient Diabetes Program Recommendations  AACE/ADA: New Consensus Statement on Inpatient Glycemic Control  Target Ranges:  Prepandial:   less than 140 mg/dL      Peak postprandial:   less than 180 mg/dL (1-2 hours)      Critically ill patients:  140 - 180 mg/dL  Pager:  [phone] Hours:  8 am-10pm   Reason for Visit: Elevated prandial glucose:  168, 249 mg/dL  Inpatient Diabetes Program Recommendations Insulin - Meal Coverage: Add Novolog 10 units TID for meal coverage  Alfredia Client PhD, RN, BC-ADM Diabetes Coordinator  Office:  [phone] Team Pager:  [phone]

## 2012-08-08 NOTE — Plan of Care (Signed)
Problem: Phase I Progression Outcomes Goal: Initial discharge plan identified Outcome: Completed/Met Date Met:  08/08/12 Plans to return home with husband.

## 2012-08-08 NOTE — H&P (Signed)
[name] [name] is an 71 y.o. female.    PCP: Colette Ribas, MD  Her gastroenterologist is Dr. Jena Gauss  Chief Complaint: Unsteady gait  HPI: This is a 71 year old, Caucasian female, with a past medical history of [name] liver cirrhosis, hepatic encephalopathy, diabetes, who was in her usual state of health about 2 weeks ago, when she started noticing that she was very unsteady on her feet. And, she's had multiple falls in the last 2 weeks. Specifically, about 4-5 times. It has happened under different circumstances. One time she actually hit her head and at that time she went to her doctor's office and was told she may have injured her coccyx. She mentioned that she does feel a little dizzy at times. However, denies any symptoms suggestive of vertigo. She denies any visual disturbances. She denies any nausea, vomiting. No difficulty with urination or bowel movements. Her family was concerned, that she may be having encephalopathy, so, they brought her to the hospital. She doesn't have a history of strokes. Did not have any weakness on any one side of the body. No difficulty swallowing or with speech. Denies any chest pains. She is right-handed.   Home Medications: Prior to Admission medications   Medication Sig Start Date End Date Taking? Authorizing Provider  ALPRAZolam Prudy Feeler) 0.5 MG tablet Take 0.5 mg by mouth 3 (three) times daily. For anxiety   Yes Historical Provider, MD  aspirin (BAYER LOW STRENGTH) 81 MG EC tablet Take 81 mg by mouth daily.     Yes Historical Provider, MD  budesonide-formoterol (SYMBICORT) 160-4.5 MCG/ACT inhaler Inhale 2 puffs into the lungs 2 (two) times daily.     Yes Historical Provider, MD  clotrimazole (LOTRIMIN) 1 % cream Apply topically 2 (two) times daily. 03/28/12 03/28/13 Yes Nimish Normajean Glasgow, MD  enalapril (VASOTEC) 5 MG tablet Take 5 mg by mouth daily.     Yes Historical Provider, MD  esomeprazole (NEXIUM) 40 MG capsule Take 40 mg by mouth daily before  breakfast.     Yes Historical Provider, MD  GENERLAC 10 GM/15ML SOLN Take 10 g by mouth daily.  07/02/12  Yes Historical Provider, MD  HYDROcodone-acetaminophen (NORCO/VICODIN) 5-325 MG per tablet Take 1 tablet by mouth every 4 (four) hours as needed. For pain 06/30/12  Yes Historical Provider, MD  insulin aspart (NOVOLOG) 100 UNIT/ML injection Inject 10-40 Units into the skin daily as needed. Per sliding scale. Patient injects 10-40 units for levels above 150 to 300    Yes Historical Provider, MD  insulin detemir (LEVEMIR) 100 UNIT/ML injection Inject 60 Units into the skin at bedtime.    Yes Historical Provider, MD  lactulose (CHRONULAC) 10 GM/15ML solution Take 60 mLs by mouth daily.    Yes Historical Provider, MD  linagliptin (TRADJENTA) 5 MG TABS tablet Take 5 mg by mouth daily.   Yes Historical Provider, MD  Multiple Vitamins-Minerals (CENTRUM SILVER PO) Take 1 tablet by mouth daily.    Yes Historical Provider, MD  pregabalin (LYRICA) 100 MG capsule Take 100 mg by mouth 2 (two) times daily.    Yes Historical Provider, MD  meclizine (ANTIVERT) 25 MG tablet Take 25 mg by mouth 3 (three) times daily as needed. For dizziness/nausea    Historical Provider, MD    Allergies:  Allergies  Allergen Reactions  . Prochlorperazine Edisylate Other (See Comments)    Causes anxiety/numbness  . Compazine (Prochlorperazine Edisylate) Anxiety    Causes anxiety & nervousness    Past Medical History: Past Medical  History  Diagnosis Date  . CAD in native artery   . Thrombocytopenia, unspecified   . Unspecified hereditary and idiopathic peripheral neuropathy   . Unspecified fall   . Other and unspecified hyperlipidemia   . Unspecified essential hypertension   . Obesity, unspecified   . Shortness of breath   . Personal history of neurosis   . Intrinsic asthma, unspecified   . Allergic rhinitis, cause unspecified   . Cirrhosis of liver     NA[facility]  . Colon adenomas 03/08/10    tcs by Dr. Jena Gauss  .  Hyperplastic polyps of stomach 03/08/10    tcs by Dr. Geni Bers  . Chronic gastritis 03/09/11    egd by Dr. Jena Gauss  . History of hemorrhoids 03/08/10    tcs- internal and external  . Diverticula of colon 03/08/10    L side  . Hiatal hernia 03/08/10  . Esophagitis, erosive 03/08/10  . GERD (gastroesophageal reflux disease)   . Wears glasses   . Type II or unspecified type diabetes mellitus without mention of complication, not stated as uncontrolled     Past Surgical History  Procedure Date  . Hysterectomy and btl     s/p  . Abdominal hysterectomy   . Tumor excision 2003    rt arm and left foot  . Colonoscopy 03/08/10    Dr. [name]-->ext/int hemorrhoids, anal paipilla, rectal polyps, desc polyps, cecal polyp, left-sided diverticula. suboptiomal prep. next TCS 02/2015. multiple adenomas  . Esophagogastroduodenoscopy 03/08/10    Dr. Dellie Catholic erosive RE, small hh, antral erosions, small 1cm are of mucosal indentation along gastric body of doubtful significance, cystic nodularity of hypophyarynx, base of tongue, base of epiglottis. benign gastric biopsies  . Tubal ligation   . Esophagogastroduodenoscopy 10/08/2011    no esophageal varices, antral erosion. next egd 09/2013  . Foot surgery 2003    lt foot  . Breast surgery   . Eye surgery     cataracts    Social History:  reports that she has never smoked. She does not have any smokeless tobacco history on file. She reports that she does not drink alcohol or use illicit drugs.  Family History:  Family History  Problem Relation Age of Onset  . Coronary artery disease      FH  . Diabetes      FH  . Heart disease Mother   . Colon cancer Neg Hx     Review of Systems - History obtained from the patient General ROS: negative Psychological ROS: negative Ophthalmic ROS: negative ENT ROS: negative Allergy and Immunology ROS: negative Hematological and Lymphatic ROS: negative Endocrine ROS: negative Respiratory ROS: no cough, shortness of  breath, or wheezing Cardiovascular ROS: no chest pain or dyspnea on exertion Gastrointestinal ROS: she does mention some abdominal discomfort at times. Genito-Urinary ROS: no dysuria, trouble voiding, or hematuria Musculoskeletal ROS: negative Neurological ROS: as in hpi Dermatological ROS: negative  Physical Examination Blood pressure 120/55, pulse 69, temperature 97.5 F (36.4 C), temperature source Oral, resp. rate 16, height 5' 7.5" (1.715 m), weight 113.399 kg (250 lb), SpO2 99.00%.  General appearance: alert, cooperative, appears stated age, no distress and moderately obese Head: Normocephalic, without obvious abnormality, atraumatic Eyes: conjunctivae/corneas clear. PERRL, EOM's intact. Fundi benign. Throat: lips, mucosa, and tongue normal; teeth and gums normal Neck: no adenopathy, no carotid bruit, no JVD, supple, symmetrical, trachea midline and thyroid not enlarged, symmetric, no tenderness/mass/nodules Resp: clear to auscultation bilaterally Cardio: regular rate and rhythm, S1, S2 normal, no  murmur, click, rub or gallop GI: soft, non-tender; bowel sounds normal; no masses,  no organomegaly Extremities: extremities normal, atraumatic, no cyanosis or edema Pulses: 2+ and symmetric Skin: Skin color, texture, turgor normal. No rashes or lesions Lymph nodes: Cervical, supraclavicular, and axillary nodes normal. Neurologic: She is alert and oriented x3. Cranial nerves are intact. No motor deficits are present. No pronator drift is identified. Finger-to-nose was normal. She could not do the testing for dysdiadochokinesis. The heel knee shin test was also equal. Gait was not assessed.  Laboratory Data: Results for orders placed during the hospital encounter of 08/07/12 (from the past 48 hour(s))  AMMONIA     Status: Normal   Collection Time   08/07/12 10:40 PM      Component Value Range Comment   Ammonia 58  11 - 60 umol/L   URINALYSIS, ROUTINE W REFLEX MICROSCOPIC     Status:  Abnormal   Collection Time   08/07/12 11:20 PM      Component Value Range Comment   Color, Urine YELLOW  YELLOW    APPearance CLEAR  CLEAR    Specific Gravity, Urine 1.025  1.005 - 1.030    pH 6.0  5.0 - 8.0    Glucose, UA 500 (*) NEGATIVE mg/dL    Hgb urine dipstick NEGATIVE  NEGATIVE    Bilirubin Urine NEGATIVE  NEGATIVE    Ketones, ur NEGATIVE  NEGATIVE mg/dL    Protein, ur NEGATIVE  NEGATIVE mg/dL    Urobilinogen, UA 2.0 (*) 0.0 - 1.0 mg/dL    Nitrite NEGATIVE  NEGATIVE    Leukocytes, UA NEGATIVE  NEGATIVE MICROSCOPIC NOT DONE ON URINES WITH NEGATIVE PROTEIN, BLOOD, LEUKOCYTES, NITRITE, OR GLUCOSE <1000 mg/dL.  URINE RAPID DRUG SCREEN (HOSP PERFORMED)     Status: Normal   Collection Time   08/07/12 11:20 PM      Component Value Range Comment   Opiates NONE DETECTED  NONE DETECTED    Cocaine NONE DETECTED  NONE DETECTED    Benzodiazepines NONE DETECTED  NONE DETECTED    Amphetamines NONE DETECTED  NONE DETECTED    Tetrahydrocannabinol NONE DETECTED  NONE DETECTED    Barbiturates NONE DETECTED  NONE DETECTED   COMPREHENSIVE METABOLIC PANEL     Status: Abnormal   Collection Time   08/08/12 12:30 AM      Component Value Range Comment   Sodium 136  135 - 145 mEq/L    Potassium 3.5  3.5 - 5.1 mEq/L    Chloride 106  96 - 112 mEq/L    CO2 23  19 - 32 mEq/L    Glucose, Bld 227 (*) 70 - 99 mg/dL    BUN 11  6 - 23 mg/dL    Creatinine, Ser 6.21  0.50 - 1.10 mg/dL    Calcium 9.0  8.4 - 30.8 mg/dL    Total Protein 5.5 (*) 6.0 - 8.3 g/dL    Albumin 3.0 (*) 3.5 - 5.2 g/dL    AST 18  0 - 37 U/L    ALT 11  0 - 35 U/L    Alkaline Phosphatase 78  39 - 117 U/L    Total Bilirubin 1.6 (*) 0.3 - 1.2 mg/dL    GFR calc non Af Amer 87 (*) >90 mL/min    GFR calc Af Amer >90  >90 mL/min   CBC WITH DIFFERENTIAL     Status: Abnormal   Collection Time   08/08/12 12:30 AM      Component  Value Range Comment   WBC 3.4 (*) 4.0 - 10.5 K/uL    RBC 4.68  3.87 - 5.11 MIL/uL    Hemoglobin 13.5  12.0 -  15.0 g/dL    HCT 47.8  29.5 - 62.1 %    MCV 83.5  78.0 - 100.0 fL    MCH 28.8  26.0 - 34.0 pg    MCHC 34.5  30.0 - 36.0 g/dL    RDW 30.8  65.7 - 84.6 %    Platelets 107 (*) 150 - 400 K/uL    Neutrophils Relative 51  43 - 77 %    Neutro Abs 1.8  1.7 - 7.7 K/uL    Lymphocytes Relative 39  12 - 46 %    Lymphs Abs 1.3  0.7 - 4.0 K/uL    Monocytes Relative 7  3 - 12 %    Monocytes Absolute 0.2  0.1 - 1.0 K/uL    Eosinophils Relative 2  0 - 5 %    Eosinophils Absolute 0.1  0.0 - 0.7 K/uL    Basophils Relative 1  0 - 1 %    Basophils Absolute 0.0  0.0 - 0.1 K/uL   LIPASE, BLOOD     Status: Normal   Collection Time   08/08/12 12:30 AM      Component Value Range Comment   Lipase 38  11 - 59 U/L   ETHANOL     Status: Normal   Collection Time   08/08/12 12:30 AM      Component Value Range Comment   Alcohol, Ethyl (B) <11  0 - 11 mg/dL   PROTIME-INR     Status: Normal   Collection Time   08/08/12 12:30 AM      Component Value Range Comment   Prothrombin Time 14.2  11.6 - 15.2 seconds    INR 1.11  0.00 - 1.49     Radiology Reports: Ct Head Wo Contrast  08/07/2012  *RADIOLOGY REPORT*  Clinical Data: Fall.  Head trauma.  Confusion.  CT HEAD WITHOUT CONTRAST  Technique:  Contiguous axial images were obtained from the base of the skull through the vertex without contrast.  Comparison: 01/25/2010 head CT.  Findings: No mass lesion, mass effect, midline shift, hydrocephalus, hemorrhage.  No acute territorial cortical ischemia/infarct. Atrophy and chronic ischemic white matter disease is present.  Paranasal sinuses clear.  IMPRESSION: Negative CT head.   Original Report Authenticated By: Andreas Newport, M.D.     Electrocardiogram: Sinus rhythm at 74 beats per minute. Left axis deviation. No Q waves. No concerning ST changes are noted. Nonspecific T wave, changes. Intervals are normal.  Assessment/Plan  Principal Problem:  *Ataxia Active Problems:  DIABETES, TYPE 2  OBESITY  CIRRHOSIS   Frequent falls  Peripheral neuropathy   #1 unsteady gait/ataxia/frequent falls: MRI of the brain will be done to rule out any intracranial reason for her symptoms. Will check a vitamin B 12 level, along with TSH. Physical and occupational therapy will be asked to see the patient. Further workup, will depend on results of initial workup.  #2 history of, diabetes, type II: She will be placed on sliding scale insulin. Continue with her long-acting insulin. HbA1c will be checked.  #3 history of NASH Liver cirrhosis: Appears to be stable. Ammonia level is normal. Continue to monitor.  #4 she's a full code.  DVT, prophylaxis with SCDs.  Further management decisions will depend on results of further testing and patient's response to treatment.  Osvaldo Shipper  [facility] Pager [phone]  08/08/2012, 3:12 AM

## 2012-08-08 NOTE — Evaluation (Signed)
Physical Therapy Evaluation Patient Details Name: [name] [name] MRN: [id number] DOB: 1941/08/05 Today's Date: 08/08/2012 Time: 0865-7846 PT Time Calculation (min): 49 min  PT Assessment / Plan / Recommendation Clinical Impression  Pt is seen for eval.  She has a long history of gait instability, known to this therapist.  She does have weakness in L extremeties compared to the R as well a LE peripheral neuropathy  and decreased dynamic standing balance.  Her husband c/o that she doesn't have the strength to get up off of the floor when she falls and has trouble standing from a low sitting chair.  She has a walker at home but doesn't use it..."furniture walks" instead.  I have reviewed home safety issues with them and strongly advised that the pt use her walker for all gait.  I am also recommending that she come in for outpatient PT to work on strengtheninig and balance activities.  She is agreeable to this.          PT Assessment  All further PT needs can be met in the next venue of care    Follow Up Recommendations  Outpatient PT    Barriers to Discharge None      Equipment Recommendations  Tub/shower bench    Recommendations for Other Services OT consult   Frequency      Precautions / Restrictions Precautions Precautions: Fall Restrictions Weight Bearing Restrictions: No   Pertinent Vitals/Pain       Mobility  Bed Mobility Bed Mobility: Supine to Sit;Sit to Supine Supine to Sit: HOB elevated;5: Supervision Sit to Supine: Not Tested (comment) Transfers Transfers: Sit to Stand;Stand to Sit Sit to Stand: 5: Supervision;From bed;With upper extremity assist Stand to Sit: 5: Supervision;With upper extremity assist;To chair/3-in-1 Ambulation/Gait Ambulation/Gait Assistance: 5: Supervision Ambulation Distance (Feet): 80 Feet Assistive device: Rolling walker Ambulation/Gait Assistance Details: gait without walker is very unstable Gait Pattern: Within Functional Limits General  Gait Details: gait is very much compromised by poor dynamic balance and possibly peripheral neuropathy...she was strongly encouraged to use a walker for all gait (she previously had been  "furniture walking"). Stairs: No Wheelchair Mobility Wheelchair Mobility: No    Shoulder Instructions     Exercises General Exercises - Lower Extremity Ankle Circles/Pumps: Strengthening;Both;10 reps;Supine Heel Slides: AROM;Both;10 reps;Supine;Strengthening Hip ABduction/ADduction: AROM;Strengthening;10 reps;Supine Straight Leg Raises: AROM;Both;10 reps;Supine   PT Diagnosis: Difficulty walking;Generalized weakness  PT Problem List: Decreased strength;Decreased activity tolerance;Decreased balance;Decreased mobility;Decreased coordination;Impaired sensation;Obesity PT Treatment Interventions:     PT Goals    Visit Information  Last PT Received On: 08/22/12    Subjective Data  Subjective: I'm very weak Patient Stated Goal: wants to be stronger   Prior Functioning  Home Living Lives With: Spouse Available Help at Discharge: Family Type of Home: House Home Access: Stairs to enter Secretary/administrator of Steps: 4 Entrance Stairs-Rails: Right Home Layout: One level Bathroom Shower/Tub: Engineer, manufacturing systems: Standard Home Adaptive Equipment: Bedside commode/3-in-1;Walker - rolling;Straight cane;Shower chair with back Prior Function Level of Independence: Independent Able to Take Stairs?: Yes Driving: No Vocation: Retired Musician: No difficulties    Cognition  Overall Cognitive Status: Appears within functional limits for tasks assessed/performed Arousal/Alertness: Awake/alert Orientation Level: Appears intact for tasks assessed Behavior During Session: [facility] for tasks performed    Extremity/Trunk Assessment Right Lower Extremity Assessment RLE ROM/Strength/Tone: Within functional levels RLE Sensation: History of peripheral neuropathy;WFL -  Proprioception RLE Coordination: WFL - gross motor Left Lower Extremity Assessment LLE ROM/Strength/Tone: Deficits LLE ROM/Strength/Tone  Deficits: strength is generally 3/5 LLE Sensation: History of peripheral neuropathy LLE Coordination: Deficits (decreased coordination) Trunk Assessment Trunk Assessment: Normal   Balance Balance Balance Assessed: Yes Dynamic Standing Balance Dynamic Standing - Balance Support: No upper extremity supported Dynamic Standing - Level of Assistance: 3: Mod assist High Level Balance High Level Balance Comments: pt is unable to stand with a narrow base of support  End of Session PT - End of Session Equipment Utilized During Treatment: Gait belt Activity Tolerance: Patient tolerated treatment well Patient left: in chair;with call bell/phone within reach;with family/visitor present Nurse Communication: Mobility status  GP     Myrlene Broker L 08/08/2012, 1:09 PM

## 2012-08-08 NOTE — Plan of Care (Signed)
Problem: Discharge Progression Outcomes Goal: Discharge plan in place and appropriate Outcome: Completed/Met Date Met:  08/08/12 Plans to discharge home with husband.

## 2012-08-09 DIAGNOSIS — R42 Dizziness and giddiness: Secondary | ICD-10-CM

## 2012-08-09 LAB — CBC
Hemoglobin: 13.6 g/dL (ref 12.0–15.0)
MCH: 29 pg (ref 26.0–34.0)
RBC: 4.69 MIL/uL (ref 3.87–5.11)

## 2012-08-09 LAB — GLUCOSE, CAPILLARY: Glucose-Capillary: 228 mg/dL — ABNORMAL HIGH (ref 70–99)

## 2012-08-09 LAB — COMPREHENSIVE METABOLIC PANEL
ALT: 10 U/L (ref 0–35)
Alkaline Phosphatase: 72 U/L (ref 39–117)
CO2: 25 mEq/L (ref 19–32)
GFR calc Af Amer: >90 mL/min (ref >90)
GFR calc non Af Amer: 89 mL/min — ABNORMAL LOW (ref >90)
Glucose, Bld: 86 mg/dL (ref 70–99)
Potassium: 3.4 mEq/L — ABNORMAL LOW (ref 3.5–5.1)
Sodium: 142 mEq/L (ref 135–145)
Total Bilirubin: 1.6 mg/dL — ABNORMAL HIGH (ref 0.3–1.2)

## 2012-08-09 LAB — VITAMIN B12: Vitamin B-12: 433 pg/mL (ref 211–911)

## 2012-08-09 LAB — HEMOGLOBIN A1C: Mean Plasma Glucose: 180 mg/dL — ABNORMAL HIGH (ref <117)

## 2012-08-09 MED ORDER — ALPRAZOLAM 0.5 MG PO TABS: 0.5000 mg | ORAL_TABLET | Freq: Three times a day (TID) | ORAL | Status: DC | PRN

## 2012-08-09 NOTE — Discharge Summary (Signed)
Physician Discharge Summary  [name] [name] JYN:[id number] DOB: 05-25-1941 DOA: 08/07/2012  PCP: Colette Ribas, MD  Admit date: 08/07/2012 Discharge date: 08/09/2012  Recommendations for Outpatient Follow-up:  1. Follow up with primary care doctor in 1-2 weeks 2. Follow up with Dr. Jena Gauss as scheduled 3. She has been referred for outpatient physical therapy  Discharge Diagnoses:  Principal Problem:  *Ataxia Active Problems:  DIABETES, TYPE 2  OBESITY  CIRRHOSIS  Frequent falls  Peripheral neuropathy Dehydration  Discharge Condition: improved  Diet recommendation: low salt  Filed Weights   08/07/12 2113 08/08/12 0436  Weight: 113.399 kg (250 lb) 112.4 kg (247 lb 12.8 oz)    History of present illness:  This is a 71 year old, Caucasian female, with a past medical history of [name] liver cirrhosis, hepatic encephalopathy, diabetes, who was in her usual state of health about 2 weeks ago, when she started noticing that she was very unsteady on her feet. And, she's had multiple falls in the last 2 weeks. Specifically, about 4-5 times. It has happened under different circumstances. One time she actually hit her head and at that time she went to her doctor's office and was told she may have injured her coccyx. She mentioned that she does feel a little dizzy at times. However, denies any symptoms suggestive of vertigo. She denies any visual disturbances. She denies any nausea, vomiting. No difficulty with urination or bowel movements. Her family was concerned, that she may be having encephalopathy, so, they brought her to the hospital. She doesn't have a history of strokes. Did not have any weakness on any one side of the body. No difficulty swallowing or with speech. Denies any chest pains. She is right-handed.   Hospital Course:  This lady was admitted to the hospital with dizziness and falls.  She was monitored on the telemetry unit and received IV fluids.  She reportedly feels  significantly improved now, and says her symptoms have resolved.  Work up in the hospital has been unrevealing.  CT head was negative, infectious work up was negative and ammonia was normal.  We have changed her xanax from scheduled to prn basis.  She was seen by physical therapy and OT and recommended outpatient PT.  Initially plans were for MRI brain, but due to rapid improvement of her symptoms, MRI was cancelled.  She feels that she is well enough to go home.  She will be discharged later day and can follow up with her primary doctor.  Her unsteady gait and falls were likely multifactorial due to her peripheral neuropathy, element of dehydration, as well as medications including opiates and benzos.  Procedures:  none  Consultations:  none  Discharge Exam: Filed Vitals:   08/08/12 1432 08/08/12 1956 08/08/12 2106 08/09/12 0625  BP: 110/66  129/69 118/60  Pulse: 77  76 61  Temp: 98.4 F (36.9 C)  98.2 F (36.8 C) 97.4 F (36.3 C)  TempSrc: Oral  Oral Oral  Resp: 20   18  Height:      Weight:      SpO2: 97% 97% 97% 95%    General: NAD Cardiovascular: s1, s2, rrr Respiratory: CTA B  Discharge Instructions  Discharge Orders    Future Appointments: Provider: Department: Dept Phone: Center:   08/26/2012 2:30 PM Corbin Ade, MD [facility] [phone] [facility]     Future Orders Please Complete By Expires   Ambulatory referral to Physical Therapy      Diet - low sodium heart healthy  Increase activity slowly      Call MD for:  persistant dizziness or light-headedness      Call MD for:  extreme fatigue      Call MD for:  temperature >100.4          Medication List     As of 08/09/2012 12:45 PM    TAKE these medications         ALPRAZolam 0.5 MG tablet   Commonly known as: XANAX   Take 1 tablet (0.5 mg total) by mouth 3 (three) times daily as needed for sleep. For anxiety      BAYER LOW STRENGTH 81 MG EC tablet   Generic drug: aspirin   Take 81 mg by  mouth daily.      budesonide-formoterol 160-4.5 MCG/ACT inhaler   Commonly known as: SYMBICORT   Inhale 2 puffs into the lungs 2 (two) times daily.      CENTRUM SILVER PO   Take 1 tablet by mouth daily.      clotrimazole 1 % cream   Commonly known as: LOTRIMIN   Apply topically 2 (two) times daily.      enalapril 5 MG tablet   Commonly known as: VASOTEC   Take 5 mg by mouth daily.      esomeprazole 40 MG capsule   Commonly known as: NEXIUM   Take 40 mg by mouth daily before breakfast.      GENERLAC 10 GM/15ML Soln   Generic drug: lactulose (encephalopathy)   Take 10 g by mouth daily.      HYDROcodone-acetaminophen 5-325 MG per tablet   Commonly known as: NORCO/VICODIN   Take 1 tablet by mouth every 4 (four) hours as needed. For pain      insulin aspart 100 UNIT/ML injection   Commonly known as: novoLOG   Inject 10-40 Units into the skin daily as needed. Per sliding scale. Patient injects 10-40 units for levels above 150 to 300      insulin detemir 100 UNIT/ML injection   Commonly known as: LEVEMIR   Inject 60 Units into the skin at bedtime.      lactulose 10 GM/15ML solution   Commonly known as: CHRONULAC   Take 60 mLs by mouth daily.      meclizine 25 MG tablet   Commonly known as: ANTIVERT   Take 25 mg by mouth 3 (three) times daily as needed. For dizziness/nausea      pregabalin 100 MG capsule   Commonly known as: LYRICA   Take 100 mg by mouth 2 (two) times daily.      TRADJENTA 5 MG Tabs tablet   Generic drug: linagliptin   Take 5 mg by mouth daily.           Follow-up Information    Follow up with Colette Ribas, MD. Schedule an appointment as soon as possible for a visit in 2 weeks.   Contact information:   [address] [place] [address] [phone]       Follow up with Eula Listen, MD. (as scheduled)    Contact information:   [address] [place] [address] [phone]            The results of significant diagnostics from this hospitalization (including imaging, microbiology, ancillary and laboratory) are listed below for reference.    Significant Diagnostic Studies: Dg Sacrum/coccyx  07/25/2012  *RADIOLOGY REPORT*  Clinical Data: Low back pain since  falling 1.5 weeks ago.  SACRUM AND COCCYX - 2+ VIEW  Comparison: Abdominal pelvic CT 03/26/2012.  Findings: There is no evidence of acute fracture or dislocation. Lower lumbar spine and sacroiliac degenerative changes appear stable.  There are stable degenerative changes within the coccyx.  IMPRESSION: No evidence of acute sacral fracture or sacroiliac joint diastasis.   Original Report Authenticated By: Gerrianne Scale, M.D.    Ct Head Wo Contrast  08/07/2012  *RADIOLOGY REPORT*  Clinical Data: Fall.  Head trauma.  Confusion.  CT HEAD WITHOUT CONTRAST  Technique:  Contiguous axial images were obtained from the base of the skull through the vertex without contrast.  Comparison: 01/25/2010 head CT.  Findings: No mass lesion, mass effect, midline shift, hydrocephalus, hemorrhage.  No acute territorial cortical ischemia/infarct. Atrophy and chronic ischemic white matter disease is present.  Paranasal sinuses clear.  IMPRESSION: Negative CT head.   Original Report Authenticated By: Andreas Newport, M.D.     Microbiology: Recent Results (from the past 240 hour(s))  URINE CULTURE     Status: Normal   Collection Time   08/07/12 11:20 PM      Component Value Range Status Comment   Specimen Description URINE, CLEAN CATCH   Final    Special Requests NONE   Final    Culture  Setup Time 08/07/2012 23:45   Final    Colony Count NO GROWTH   Final    Culture NO GROWTH   Final    Report Status 08/08/2012 FINAL   Final      Labs: Basic Metabolic Panel:  Lab 08/09/12 1610 08/08/12 0030  NA 142 136  K 3.4* 3.5  CL 111 106  CO2 25 23  GLUCOSE 86 227*  BUN 9 11  CREATININE 0.63 0.68  CALCIUM 8.8 9.0  MG -- --    PHOS -- --   Liver Function Tests:  Lab 08/09/12 0604 08/08/12 0030  AST 17 18  ALT 10 11  ALKPHOS 72 78  BILITOT 1.6* 1.6*  PROT 5.3* 5.5*  ALBUMIN 3.0* 3.0*    Lab 08/08/12 0030  LIPASE 38  AMYLASE --    Lab 08/07/12 2240  AMMONIA 58   CBC:  Lab 08/09/12 0604 08/08/12 0030  WBC 3.3* 3.4*  NEUTROABS -- 1.8  HGB 13.6 13.5  HCT 39.1 39.1  MCV 83.4 83.5  PLT 112* 107*   Cardiac Enzymes: No results found for this basename: CKTOTAL:5,CKMB:5,CKMBINDEX:5,TROPONINI:5 in the last 168 hours BNP: BNP (last 3 results) No results found for this basename: PROBNP:3 in the last 8760 hours CBG:  Lab 08/09/12 1152 08/09/12 0733 08/08/12 2137 08/08/12 1644 08/08/12 1139  GLUCAP 228* 86 205* 316* 249*    Time coordinating discharge: greater than 30 minutes  Signed:  Grant Swager  [facility] 08/09/2012, 12:45 PM

## 2012-08-09 NOTE — Progress Notes (Signed)
Pt has been d/c'd with instructions.  She verbalizes understanding.  She left the floor via w/c with staff in stable condition.  She voices no further complaints or concerns at this time.

## 2012-08-20 ENCOUNTER — Inpatient Hospital Stay (HOSPITAL_COMMUNITY): Admission: RE | Admit: 2012-08-20 | Payer: Medicare Other | Source: Ambulatory Visit | Admitting: Physical Therapy

## 2012-08-26 ENCOUNTER — Encounter: Payer: Self-pay | Admitting: Internal Medicine

## 2012-08-26 ENCOUNTER — Ambulatory Visit (INDEPENDENT_AMBULATORY_CARE_PROVIDER_SITE_OTHER): Payer: Medicare Other | Admitting: Internal Medicine

## 2012-08-26 VITALS — BP 117/72 | HR 84 | Temp 98.0°F | Ht 68.0 in | Wt 246.4 lb

## 2012-08-26 DIAGNOSIS — K7689 Other specified diseases of liver: Secondary | ICD-10-CM

## 2012-08-26 DIAGNOSIS — K7460 Unspecified cirrhosis of liver: Secondary | ICD-10-CM

## 2012-08-26 NOTE — Patient Instructions (Addendum)
EGD in 1 year  Colonoscopy in 2016  U/s of liver now; continue every 6 months with AFP  Continue lactulose 60cc twice daily  See Dr. Phillips Odor as planned  Hepatitis A total antibody  OV here in 6 months

## 2012-08-26 NOTE — Progress Notes (Deleted)
Primary Care Physician:  Colette Ribas, MD Primary Gastroenterologist:  Dr. Jena Gauss  Pre-Procedure History & Physical: HPI:  [name] [name] is a 71 y.o. female here for   Past Medical History  Diagnosis Date  . CAD in native artery   . Thrombocytopenia, unspecified   . Unspecified hereditary and idiopathic peripheral neuropathy   . Unspecified fall   . Other and unspecified hyperlipidemia   . Unspecified essential hypertension   . Obesity, unspecified   . Shortness of breath   . Personal history of neurosis   . Intrinsic asthma, unspecified   . Allergic rhinitis, cause unspecified   . Cirrhosis of liver     NASH, afp on 06/17/12 =3.7, per pt she had hep B vaccines in 1996  . Colon adenomas 03/08/10    tcs by Dr. Jena Gauss  . Hyperplastic polyps of stomach 03/08/10    tcs by Dr. Geni Bers  . Chronic gastritis 03/09/11    egd by Dr. Jena Gauss  . History of hemorrhoids 03/08/10    tcs- internal and external  . Diverticula of colon 03/08/10    L side  . Hiatal hernia 03/08/10  . Esophagitis, erosive 03/08/10  . GERD (gastroesophageal reflux disease)   . Wears glasses   . Type II or unspecified type diabetes mellitus without mention of complication, not stated as uncontrolled     Past Surgical History  Procedure Date  . Hysterectomy and btl     s/p  . Abdominal hysterectomy   . Tumor excision 2003    rt arm and left foot  . Colonoscopy 03/08/10    Dr. Emin Foree-->ext/int hemorrhoids, anal paipilla, rectal polyps, desc polyps, cecal polyp, left-sided diverticula. suboptiomal prep. next TCS 02/2015. multiple adenomas  . Esophagogastroduodenoscopy 03/08/10    Dr. Dellie Catholic erosive RE, small hh, antral erosions, small 1cm are of mucosal indentation along gastric body of doubtful significance, cystic nodularity of hypophyarynx, base of tongue, base of epiglottis. benign gastric biopsies  . Tubal ligation   . Esophagogastroduodenoscopy 10/08/2011    Dr. Dionisio David esophageal varices, antral erosion.  next egd 09/2013  . Foot surgery 2003    lt foot  . Breast surgery   . Eye surgery     cataracts    Prior to Admission medications   Medication Sig Start Date End Date Taking? Authorizing Provider  ALPRAZolam Prudy Feeler) 0.5 MG tablet Take 1 tablet (0.5 mg total) by mouth 3 (three) times daily as needed for sleep. For anxiety 08/09/12  Yes Erick Blinks, MD  aspirin (BAYER LOW STRENGTH) 81 MG EC tablet Take 81 mg by mouth daily.     Yes Historical Provider, MD  budesonide-formoterol (SYMBICORT) 160-4.5 MCG/ACT inhaler Inhale 2 puffs into the lungs 2 (two) times daily.     Yes Historical Provider, MD  clotrimazole (LOTRIMIN) 1 % cream Apply topically 2 (two) times daily. 03/28/12 03/28/13 Yes Nimish Normajean Glasgow, MD  enalapril (VASOTEC) 5 MG tablet Take 5 mg by mouth daily.     Yes Historical Provider, MD  esomeprazole (NEXIUM) 40 MG capsule Take 40 mg by mouth daily before breakfast.     Yes Historical Provider, MD  GENERLAC 10 GM/15ML SOLN Take 10 g by mouth daily.  07/02/12  Yes Historical Provider, MD  HYDROcodone-acetaminophen (NORCO/VICODIN) 5-325 MG per tablet Take 1 tablet by mouth every 4 (four) hours as needed. For pain 06/30/12  Yes Historical Provider, MD  insulin aspart (NOVOLOG) 100 UNIT/ML injection Inject 10-40 Units into the skin daily as needed. Per sliding  scale. Patient injects 10-40 units for levels above 150 to 300    Yes Historical Provider, MD  insulin detemir (LEVEMIR) 100 UNIT/ML injection Inject 60 Units into the skin at bedtime.    Yes Historical Provider, MD  lactulose (CHRONULAC) 10 GM/15ML solution Take 60 mLs by mouth daily.    Yes Historical Provider, MD  linagliptin (TRADJENTA) 5 MG TABS tablet Take 5 mg by mouth daily.   Yes Historical Provider, MD  meclizine (ANTIVERT) 25 MG tablet Take 25 mg by mouth 3 (three) times daily as needed. For dizziness/nausea   Yes Historical Provider, MD  Multiple Vitamins-Minerals (CENTRUM SILVER PO) Take 1 tablet by mouth daily.    Yes  Historical Provider, MD  pregabalin (LYRICA) 100 MG capsule Take 100 mg by mouth 2 (two) times daily.    Yes Historical Provider, MD    Allergies as of 08/26/2012 - Review Complete 08/26/2012  Allergen Reaction Noted  . Prochlorperazine edisylate Other (See Comments)   . Compazine (prochlorperazine edisylate) Anxiety 06/04/2012    Family History  Problem Relation Age of Onset  . Coronary artery disease      FH  . Diabetes      FH  . Heart disease Mother   . Colon cancer Neg Hx     History   Social History  . Marital Status: Married    Spouse Name: N/A    Number of Children: 3  . Years of Education: N/A   Occupational History  . Not on file.   Social History Main Topics  . Smoking status: Never Smoker   . Smokeless tobacco: Not on file  . Alcohol Use: No  . Drug Use: No  . Sexually Active: Not Currently   Other Topics Concern  . Not on file   Social History Narrative  . No narrative on file    Review of Systems: See HPI, otherwise negative ROS  Physical Exam: BP 117/72  Pulse 84  Temp 98 F (36.7 C) (Temporal)  Ht 5\' 8"  (1.727 m)  Wt 246 lb 6.4 oz (111.766 kg)  BMI 37.46 kg/m2 General:   Alert,  Well-developed, well-nourished, pleasant and cooperative in NAD Skin:  Intact without significant lesions or rashes. Eyes:  Sclera clear, no icterus.   Conjunctiva pink. Ears:  Normal auditory acuity. Nose:  No deformity, discharge,  or lesions. Mouth:  No deformity or lesions. Neck:  Supple; no masses or thyromegaly. No significant cervical adenopathy. Lungs:  Clear throughout to auscultation.   No wheezes, crackles, or rhonchi. No acute distress. Heart:  Regular rate and rhythm; no murmurs, clicks, rubs,  or gallops. Abdomen: Non-distended, normal bowel sounds.  Soft and nontender without appreciable mass or hepatosplenomegaly.  Pulses:  Normal pulses noted. Extremities:  Without clubbing or edema.  Impression/Plan:  ***

## 2012-08-26 NOTE — Progress Notes (Signed)
Primary Care Physician:  Colette Ribas, MD Primary Gastroenterologist:  Dr. Jena Gauss  Pre-Procedure History & Physical: HPI:  [name] [name] is a 71 y.o. female here for followup of [name] cirrhosis. Patient reports being hospitalized for a few days with "dehydration". Having for about 4 bowel movements daily on lactulose 60 cc orally twice daily. No Xifaxin at this time. Husband states mental status at baseline recently. She has received hepatitis B vaccine. Hepatitis A status unknown. No esophageal varices on EGD one year ago. She'll be due for surveillance examination in one year for now. History of colonic adenomas-due for surveillance colonoscopy 2016. Due for ultrasound screening at this time.   Past Medical History  Diagnosis Date  . CAD in native artery   . Thrombocytopenia, unspecified   . Unspecified hereditary and idiopathic peripheral neuropathy   . Unspecified fall   . Other and unspecified hyperlipidemia   . Unspecified essential hypertension   . Obesity, unspecified   . Shortness of breath   . Personal history of neurosis   . Intrinsic asthma, unspecified   . Allergic rhinitis, cause unspecified   . Cirrhosis of liver     NASH, afp on 06/17/12 =3.7, per pt she had hep B vaccines in 1996  . Colon adenomas 03/08/10    tcs by Dr. Jena Gauss  . Hyperplastic polyps of stomach 03/08/10    tcs by Dr. Geni Bers  . Chronic gastritis 03/09/11    egd by Dr. Jena Gauss  . History of hemorrhoids 03/08/10    tcs- internal and external  . Diverticula of colon 03/08/10    L side  . Hiatal hernia 03/08/10  . Esophagitis, erosive 03/08/10  . GERD (gastroesophageal reflux disease)   . Wears glasses   . Type II or unspecified type diabetes mellitus without mention of complication, not stated as uncontrolled     Past Surgical History  Procedure Date  . Hysterectomy and btl     s/p  . Abdominal hysterectomy   . Tumor excision 2003    rt arm and left foot  . Colonoscopy 03/08/10    Dr. Lendora Keys-->ext/int  hemorrhoids, anal paipilla, rectal polyps, desc polyps, cecal polyp, left-sided diverticula. suboptiomal prep. next TCS 02/2015. multiple adenomas  . Esophagogastroduodenoscopy 03/08/10    Dr. Dellie Catholic erosive RE, small hh, antral erosions, small 1cm are of mucosal indentation along gastric body of doubtful significance, cystic nodularity of hypophyarynx, base of tongue, base of epiglottis. benign gastric biopsies  . Tubal ligation   . Esophagogastroduodenoscopy 10/08/2011    Dr. Dionisio David esophageal varices, antral erosion. next egd 09/2013  . Foot surgery 2003    lt foot  . Breast surgery   . Eye surgery     cataracts    Prior to Admission medications   Medication Sig Start Date End Date Taking? Authorizing Provider  ALPRAZolam Prudy Feeler) 0.5 MG tablet Take 1 tablet (0.5 mg total) by mouth 3 (three) times daily as needed for sleep. For anxiety 08/09/12  Yes Erick Blinks, MD  aspirin (BAYER LOW STRENGTH) 81 MG EC tablet Take 81 mg by mouth daily.     Yes Historical Provider, MD  budesonide-formoterol (SYMBICORT) 160-4.5 MCG/ACT inhaler Inhale 2 puffs into the lungs 2 (two) times daily.     Yes Historical Provider, MD  clotrimazole (LOTRIMIN) 1 % cream Apply topically 2 (two) times daily. 03/28/12 03/28/13 Yes Nimish Normajean Glasgow, MD  enalapril (VASOTEC) 5 MG tablet Take 5 mg by mouth daily.     Yes Historical Provider, MD  esomeprazole (NEXIUM) 40 MG capsule Take 40 mg by mouth daily before breakfast.     Yes Historical Provider, MD  GENERLAC 10 GM/15ML SOLN Take 10 g by mouth daily.  07/02/12  Yes Historical Provider, MD  HYDROcodone-acetaminophen (NORCO/VICODIN) 5-325 MG per tablet Take 1 tablet by mouth every 4 (four) hours as needed. For pain 06/30/12  Yes Historical Provider, MD  insulin aspart (NOVOLOG) 100 UNIT/ML injection Inject 10-40 Units into the skin daily as needed. Per sliding scale. Patient injects 10-40 units for levels above 150 to 300    Yes Historical Provider, MD  insulin  detemir (LEVEMIR) 100 UNIT/ML injection Inject 60 Units into the skin at bedtime.    Yes Historical Provider, MD  lactulose (CHRONULAC) 10 GM/15ML solution Take 60 mLs by mouth daily.    Yes Historical Provider, MD  linagliptin (TRADJENTA) 5 MG TABS tablet Take 5 mg by mouth daily.   Yes Historical Provider, MD  meclizine (ANTIVERT) 25 MG tablet Take 25 mg by mouth 3 (three) times daily as needed. For dizziness/nausea   Yes Historical Provider, MD  Multiple Vitamins-Minerals (CENTRUM SILVER PO) Take 1 tablet by mouth daily.    Yes Historical Provider, MD  pregabalin (LYRICA) 100 MG capsule Take 100 mg by mouth 2 (two) times daily.    Yes Historical Provider, MD    Allergies as of 08/26/2012 - Review Complete 08/26/2012  Allergen Reaction Noted  . Prochlorperazine edisylate Other (See Comments)   . Compazine (prochlorperazine edisylate) Anxiety 06/04/2012    Family History  Problem Relation Age of Onset  . Coronary artery disease      FH  . Diabetes      FH  . Heart disease Mother   . Colon cancer Neg Hx     History   Social History  . Marital Status: Married    Spouse Name: N/A    Number of Children: 3  . Years of Education: N/A   Occupational History  . Not on file.   Social History Main Topics  . Smoking status: Never Smoker   . Smokeless tobacco: Not on file  . Alcohol Use: No  . Drug Use: No  . Sexually Active: Not Currently   Other Topics Concern  . Not on file   Social History Narrative  . No narrative on file    Review of Systems: See HPI, otherwise negative ROS  Physical Exam: BP 117/72  Pulse 84  Temp 98 F (36.7 C) (Temporal)  Ht 5\' 8"  (1.727 m)  Wt 246 lb 6.4 oz (111.766 kg)  BMI 37.46 kg/m2 General:   Alert,  Well-developed, well-nourished, pleasant and cooperative in NAD. Accompanied by husband. Patient well oriented x4. No asterixis Skin:  Intact without significant lesions or rashes. Eyes:  Sclera clear, no icterus.   Conjunctiva  pink. Ears:  Normal auditory acuity. Nose:  No deformity, discharge,  or lesions. Mouth:  No deformity or lesions. Neck:  Supple; no masses or thyromegaly. No significant cervical adenopathy. Lungs:  Clear throughout to auscultation.   No wheezes, crackles, or rhonchi. No acute distress. Heart:  Regular rate and rhythm; no murmurs, clicks, rubs,  or gallops. Abdomen: Obese. Positive bowel sounds soft nontender. No obvious mass or hepatosplenomegaly. No shifting dullness or fluid wave.  Pulses:  Normal pulses noted. Extremities:  Without clubbing or edema.  Impression/Plan:  71 year old lady with [name] cirrhosis currently fairly well compensated. Clinically, no significant encephalopathy at this time on lactulose therapy.  We nee to determine  hepatitis A immune status. She'll be vaccinated if she is susceptible. She was urged to get her flu vaccine.  She is due for a screening ultrasound at this time. Alpha-fetoprotein has been okay.     Recommendations:  EGD in 1 year  Colonoscopy in 2016  U/s of liver now; continue every 6 months with AFP  Continue lactulose 60cc twice daily  See Dr. Phillips Odor as planned  Hepatitis A total antibody  OV here in 6 months

## 2012-08-27 ENCOUNTER — Ambulatory Visit (HOSPITAL_COMMUNITY): Payer: Medicare Other | Admitting: Physical Therapy

## 2012-08-29 ENCOUNTER — Ambulatory Visit (HOSPITAL_COMMUNITY)
Admission: RE | Admit: 2012-08-29 | Discharge: 2012-08-29 | Disposition: A | Discharge status: Home / Self Care | Payer: Medicare Other | Source: Ambulatory Visit | Attending: Internal Medicine | Admitting: Internal Medicine

## 2012-08-29 DIAGNOSIS — Q619 Cystic kidney disease, unspecified: Secondary | ICD-10-CM | POA: Insufficient documentation

## 2012-08-29 DIAGNOSIS — K7460 Unspecified cirrhosis of liver: Secondary | ICD-10-CM | POA: Insufficient documentation

## 2012-08-29 DIAGNOSIS — K8020 Calculus of gallbladder without cholecystitis without obstruction: Secondary | ICD-10-CM | POA: Insufficient documentation

## 2012-09-02 ENCOUNTER — Ambulatory Visit (HOSPITAL_COMMUNITY)
Admission: RE | Admit: 2012-09-02 | Discharge: 2012-09-02 | Disposition: A | Discharge status: Home / Self Care | Payer: Medicare Other | Source: Ambulatory Visit | Attending: Internal Medicine | Admitting: Internal Medicine

## 2012-09-02 DIAGNOSIS — R42 Dizziness and giddiness: Secondary | ICD-10-CM | POA: Insufficient documentation

## 2012-09-02 DIAGNOSIS — IMO0001 Reserved for inherently not codable concepts without codable children: Secondary | ICD-10-CM | POA: Insufficient documentation

## 2012-09-02 DIAGNOSIS — R269 Unspecified abnormalities of gait and mobility: Secondary | ICD-10-CM | POA: Insufficient documentation

## 2012-09-02 DIAGNOSIS — Z9181 History of falling: Secondary | ICD-10-CM | POA: Insufficient documentation

## 2012-09-02 DIAGNOSIS — E119 Type 2 diabetes mellitus without complications: Secondary | ICD-10-CM | POA: Insufficient documentation

## 2012-09-02 DIAGNOSIS — I10 Essential (primary) hypertension: Secondary | ICD-10-CM | POA: Insufficient documentation

## 2012-09-02 DIAGNOSIS — M6281 Muscle weakness (generalized): Secondary | ICD-10-CM | POA: Insufficient documentation

## 2012-09-02 DIAGNOSIS — G629 Polyneuropathy, unspecified: Secondary | ICD-10-CM | POA: Diagnosis present

## 2012-09-02 LAB — HEPATITIS A ANTIBODY, TOTAL: Hep A Total Ab: NEGATIVE

## 2012-09-02 NOTE — Evaluation (Signed)
Physical Therapy Evaluation  Patient Details  Name: TANEE HENERY MRN: [id number] Date of Birth: May 12, 1941  Today's Date: 09/02/2012 Time: 1300-1345 PT Time Calculation (min): 45 min Charges: 1 eval Visit#: 1  of 8   Re-eval: 10/02/12 Assessment Diagnosis: Dizziness Next MD Visit: Dr. Kerry Hough - unscheduled; Dr. Phillips Odor (PCP) - 1 month Prior Therapy: None  Authorization: [facility] Medicare  Authorization Time Period:    Authorization Visit#: 1  of 10    Past Medical History:  Past Medical History  Diagnosis Date  . CAD in native artery   . Thrombocytopenia, unspecified   . Unspecified hereditary and idiopathic peripheral neuropathy   . Unspecified fall   . Other and unspecified hyperlipidemia   . Unspecified essential hypertension   . Obesity, unspecified   . Shortness of breath   . Personal history of neurosis   . Intrinsic asthma, unspecified   . Allergic rhinitis, cause unspecified   . Cirrhosis of liver     NASH, afp on 06/17/12 =3.7, per pt she had hep B vaccines in 1996  . Colon adenomas 03/08/10    tcs by Dr. Jena Gauss  . Hyperplastic polyps of stomach 03/08/10    tcs by Dr. Geni Bers  . Chronic gastritis 03/09/11    egd by Dr. Jena Gauss  . History of hemorrhoids 03/08/10    tcs- internal and external  . Diverticula of colon 03/08/10    L side  . Hiatal hernia 03/08/10  . Esophagitis, erosive 03/08/10  . GERD (gastroesophageal reflux disease)   . Wears glasses   . Type II or unspecified type diabetes mellitus without mention of complication, not stated as uncontrolled    Past Surgical History:  Past Surgical History  Procedure Date  . Hysterectomy and btl     s/p  . Abdominal hysterectomy   . Tumor excision 2003    rt arm and left foot  . Colonoscopy 03/08/10    Dr. [name]-->ext/int hemorrhoids, anal paipilla, rectal polyps, desc polyps, cecal polyp, left-sided diverticula. suboptiomal prep. next TCS 02/2015. multiple adenomas  . Esophagogastroduodenoscopy 03/08/10    Dr.  Dellie Catholic erosive RE, small hh, antral erosions, small 1cm are of mucosal indentation along gastric body of doubtful significance, cystic nodularity of hypophyarynx, base of tongue, base of epiglottis. benign gastric biopsies  . Tubal ligation   . Esophagogastroduodenoscopy 10/08/2011    Dr. Dionisio David esophageal varices, antral erosion. next egd 09/2013  . Foot surgery 2003    lt foot  . Breast surgery   . Eye surgery     cataracts   Subjective Symptoms/Limitations Symptoms: PMH: see hx section.  Pertinent History: Pt reports that she has been very dizzy for about 2 years.  Her greatest fear is walking into her bathroom and requires supervision from her husband.  She reports she has a lot of trouble getting into and out of her bathtub.  She states she has fallen about 10-12 times in the past year with most being in the bathroom. Reports a history of B&B weakness with stress urinary incontinece.  Reports coccyx pain and has purchased a cushion for it, however does not use it.  She states that she has periphreal neuropathy to her BLE which is unbearable, also states that her eyes are burning today.  her c/co and concerns are her impaired balace and decreased strength to her legs.  Patient Stated Goals: I want to strengthening my legs in order to balance better.   Pain Assessment Currently in Pain?: Yes Pain Score:  (  did not rate pain) Pain Location: Coccyx  Precautions/Restrictions  Precautions Precautions: Fall  Prior Functioning  Home Living Lives With: Spouse Prior Function Driving: Yes (occasionaly.  Limited by dizziness) Comments: She enjoys getting out with her children and grandchildren.    Cognition/Observation Observation/Other Assessments Observations: CN Testing: + for occulomotor, + glossopharengeal,  weakness to B eye musculature with inability to trach inwards.  + RombergDizziness with R head rotation.  Other Assessments: Morbid obesity, atrophy to B gluteal region,  weakness throughout core musculature, unable to long sit secondary to decrased LE flexibility, impaired body awareness with activities.  Sensation/Coordination/Flexibility/Functional Tests Sensation Additional Comments: Decreased to BLE secondary to periphreal neuropathy Coordination Gross Motor Movements are Fluid and Coordinated: No Coordination and Movement Description: impaired core motor control Functional Tests Functional Tests: ABC: 425.%  Assessment RLE Strength Right Hip Flexion: 5/5 Right Hip Extension: 3-/5 Right Hip ABduction: 3+/5 Right Knee Flexion: 5/5 Right Knee Extension: 5/5 LLE Strength Left Hip Flexion: 5/5 Left Hip Extension: 3-/5 Left Hip ABduction: 3+/5 Left Knee Flexion: 4/5 Left Knee Extension: 5/5  Mobility/Balance  Ambulation/Gait Ambulation/Gait: Yes Gait Pattern: Trendelenburg;Trunk flexed Posture/Postural Control Posture/Postural Control: Postural limitations Postural Limitations: upper cross and lower cross sydnrome Static Standing Balance Static Standing - Comment/# of Minutes: Each position held for a max of 10 sec Single Leg Stance - Right Leg: 2  (gluteal weakness) Single Leg Stance - Left Leg: 3  (gluteal weakness) Tandem Stance - Right Leg: 3  Tandem Stance - Left Leg: 3  Rhomberg - Eyes Opened: 10  Rhomberg - Eyes Closed: 6  (imparied ankle and hip strategy)   Exercise/Treatments Supine Bridges: 10 reps;Right;Left Straight Leg Raises: Right;Left;10 reps;Limitations Straight Leg Raises Limitations: cueing for proper technique and body awareness, and TrA activaition Supine Canalith Repositioning - Right: complete without signs or symptoms of dizziness Canalith Repositioning - Left: completed without signs or symptoms of dizziness    Physical Therapy Assessment and Plan PT Assessment and Plan Clinical Impression Statement: Pt is a 71 year old female referred to PT for personal hx of falls and dizziness.  After examination it was  found she has significant weakness to muscles innervated by occulomotor as well as significant postural defects which are likely the cause of her dizziness.  She has significant LE and core weakness as well as poor body awareness which is likely causng her to fall.   Pt will benefit from skilled therapeutic intervention in order to improve on the following deficits: Decreased strength;Decreased activity tolerance;Decreased coordination;Pain;Improper body mechanics;Impaired perceived functional ability Rehab Potential: Fair Clinical Impairments Affecting Rehab Potential: secondary to co-morbitities PT Frequency: Min 2X/week PT Duration: 6 weeks PT Treatment/Interventions: Therapeutic exercise;Balance training;Therapeutic activities;Functional mobility training;Neuromuscular re-education;Patient/family education PT Plan: Please perform BERG.  Start with low level balance activities (feet together (eyes opened and closed), add pertabations when ready, tandem stance, standing on step), continue to encourage appropriate posture (t-band activities), eye exercises (gaze stabilzation and saccades)  to improve strength and decrease dizziness.    Goals Home Exercise Program Pt will Perform Home Exercise Program: Independently PT Goal: Perform Home Exercise Program - Progress: Goal set today PT Short Term Goals Time to Complete Short Term Goals: 3 weeks PT Short Term Goal 1: Pt will report a decrease in dizziness by 50% PT Short Term Goal 2: Pt will improve LE strength by 1 muscle grade.  PT Short Term Goal 3: Pt will demonstrate tandem stance x10 sec on static surface.  PT Short Term Goal 4: Pt  will improve core strength in order to sit with appropriate posture w/mod cueing x5 minutes.  PT Short Term Goal 5: Pt will demonstrate R and L stance on 6 in. step with head movements with reports of minmal dizziness. PT Long Term Goals Time to Complete Long Term Goals: Other (comment) (6 weeks) PT Long Term Goal  1: Pt will improve LE and core coordination and strength in order to tolerate ambulating on outdoor surface x10 minutes in order to participate in community ambulation with her family.  PT Long Term Goal 2: Pt will improve her core strength in order to sit and stand with appropriate posture with min cueing in order to decrease her dizziness.  Long Term Goal 3: Pt will improve her ABC to 60% for improved percieved functional ability.  Long Term Goal 4: Pt will report 0 falls and decrease in stumbling by 75% for 3 weeks to decrease fall risk and secondary impairments.  PT Long Term Goal 5: Pt will improve static and dynamic balance and score a 48/56 on BBS.   Problem List Patient Active Problem List  Diagnosis  . DIABETES, TYPE 2  . HYPERLIPIDEMIA  . OBESITY  . THROMBOCYTOPENIA, CHRONIC  . PERIPHERAL NEUROPATHY  . HYPERTENSION  . CAD, NATIVE VESSEL  . ALLERGIC RHINITIS  . Intrinsic asthma, unspecified  . GERD  . CIRRHOSIS  . Other chronic nonalcoholic liver disease  . DYSPNEA  . ANXIETY DISORDER, HX OF  . COLONIC POLYPS, HX OF  . Hyperlipidemia  . Essential hypertension  . Uncontrolled type 2 diabetes mellitus with complication  . Posterior tibial tendon dysfunction  . Encephalopathy chronic  . Renal colic  . Anemia  . 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 Preoperative clearance  . Frequent falls  . Ataxia  . Peripheral neuropathy  . Personal history of fall  . Muscle weakness (generalized)  . Dizziness   General Behavior During Session: [facility] for tasks performed Cognition: [facility] for tasks performed PT Plan of Care PT Home Exercise Plan: see scanned report  PT Patient Instructions: importance of core strength, body awareness, posture.  Discussed gaze stabilization.  Discussed talking to her optomotrist. Discussed NS and Cx policy Consulted and Agree with Plan of Care: Patient  GP Functional Assessment Tool Used: ABC 42.5% (57.5% impairment) Functional  Limitation: Mobility: Walking and moving around Mobility: Walking and Moving Around Current Status ([id number]): At least 40 percent but less than 60 percent impaired, limited or restricted Mobility: Walking and Moving Around Goal Status [phone]): At least 1 percent but less than 20 percent impaired, limited or restricted  Apollos Tenbrink, PT  09/02/2012, 2:12 PM  Physician Documentation Your signature is required to indicate approval of the treatment plan as stated above.  Please sign and either send electronically or make a copy of this report for your files and return this physician signed original.   Please mark one 1.__approve of plan  2. ___approve of plan with the following conditions.   ______________________________                                                          _____________________ Physician Signature  Date  

## 2012-09-04 ENCOUNTER — Ambulatory Visit (HOSPITAL_COMMUNITY)
Admission: RE | Admit: 2012-09-04 | Discharge: 2012-09-04 | Disposition: A | Discharge status: Home / Self Care | Payer: Medicare Other | Source: Ambulatory Visit | Attending: Internal Medicine | Admitting: Internal Medicine

## 2012-09-04 NOTE — Progress Notes (Signed)
Physical Therapy Treatment Patient Details  Name: [name] [name] MRN: [id number] Date of Birth: [birth year]  Today's Date: 09/04/2012 Time: 5784-6962 PT Time Calculation (min): 39 min  Visit#: 2  of 8   Re-eval: 10/02/12 Assessment Diagnosis: Dizziness Next MD Visit: Dr. Kerry Hough - unscheduled; Dr. Phillips Odor (PCP) - 1 month Prior Therapy: None Charge: Physical performance testing x 20' NMR 18'  Authorization: [facility] Medicare  Authorization Time Period:    Authorization Visit#: 2  of 10    Subjective: Symptoms/Limitations Symptoms: Pt reported pain free this session besides her tailbone that she hurt when falling.  Pt stated not dizzy at entrance.  Objective:   Exercise/Treatments Mobility/Balance     Berg Balance Test Sit to Stand: Able to stand without using hands and stabilize independently Standing Unsupported: Able to stand safely 2 minutes Sitting with Back Unsupported but Feet Supported on Floor or Stool: Able to sit safely and securely 2 minutes Stand to Sit: Sits safely with minimal use of hands Transfers: Able to transfer safely, minor use of hands Standing Unsupported with Eyes Closed: Able to stand 10 seconds with supervision Standing Ubsupported with Feet Together: Needs help to attain position but able to stand for 30 seconds with feet together From Standing, Reach Forward with Outstretched Arm: Can reach forward >5 cm safely (2") From Standing Position, Pick up Object from Floor: Able to pick up shoe, needs supervision From Standing Position, Turn to Look Behind Over each Shoulder: Looks behind one side only/other side shows less weight shift (left further than right) Turn 360 Degrees: Able to turn 360 degrees safely but slowly (7 seconds each direction) Standing Unsupported, Alternately Place Feet on Step/Stool: Able to complete 4 steps without aid or supervision Standing Unsupported, One Foot in Front: Needs help to step but can hold 15 seconds Standing on One Leg:  Tries to lift leg/unable to hold 3 seconds but remains standing independently Total Score: 38   Balance Exercises Standing Standing Eyes Opened: 3 reps;30 secs Standing Eyes Closed: 5 reps;Time Standing Eyes Closed Time: max 8" before LOB Tandem Stance: Eyes open;3 reps;30 secs;Limitations Tandem Stance Limitations: with min assistance Sit to Stand: Limitations;Standard surface Sit to Stand Limitations: 5 STS without HHA   Physical Therapy Assessment and Plan PT Assessment and Plan Clinical Impression Statement: BERG balance test complete, pt scored 38/52, pt explained her high predictable chance of falls and encouraged to start using her AD indoors and outdoors.  Pt encouraged to remove any loose rugs around the house and to place non-skid rugs in bathroom to reduce risk of falls.  Pt did require mod assistance with static standing balance activities and vc-ing for spatial awareness. PT Plan: Continue with low level balance activities (feet together (eyes opened and closed), add pertabations when ready, tandem stance, standing on step), continue to encourage appropriate posture (t-band activities), eye exercises (gaze stabilzation and saccades) to improve strength and decrease dizziness.    Goals    Problem List Patient Active Problem List  Diagnosis  . DIABETES, TYPE 2  . HYPERLIPIDEMIA  . OBESITY  . THROMBOCYTOPENIA, CHRONIC  . PERIPHERAL NEUROPATHY  . HYPERTENSION  . CAD, NATIVE VESSEL  . ALLERGIC RHINITIS  . Intrinsic asthma, unspecified  . GERD  . CIRRHOSIS  . Other chronic nonalcoholic liver disease  . DYSPNEA  . ANXIETY DISORDER, HX OF  . COLONIC POLYPS, HX OF  . Hyperlipidemia  . Essential hypertension  . Uncontrolled type 2 diabetes mellitus with complication  . Posterior tibial tendon  dysfunction  . Encephalopathy chronic  . Renal colic  . Anemia  . 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 Preoperative clearance  . Frequent falls  . Ataxia  .  Peripheral neuropathy  . Personal history of fall  . Muscle weakness (generalized)  . Dizziness    PT - End of Session Equipment Utilized During Treatment: Gait belt Activity Tolerance: Patient tolerated treatment well;Patient limited by fatigue General Behavior During Session: [facility] for tasks performed Cognition: [facility] for tasks performed  GP    Juel Burrow 09/04/2012, 6:26 PM

## 2012-09-09 ENCOUNTER — Ambulatory Visit (HOSPITAL_COMMUNITY): Payer: Medicare Other | Admitting: Physical Therapy

## 2012-09-11 ENCOUNTER — Ambulatory Visit (HOSPITAL_COMMUNITY)
Admission: RE | Admit: 2012-09-11 | Discharge: 2012-09-11 | Disposition: A | Discharge status: Home / Self Care | Payer: Medicare Other | Source: Ambulatory Visit | Attending: Internal Medicine | Admitting: Internal Medicine

## 2012-09-11 NOTE — Progress Notes (Signed)
Physical Therapy Treatment Patient Details  Name: [name] [name] MRN: [id number] Date of Birth: [birth year]  Today's Date: 09/11/2012 Time: 4098-1191 PT Time Calculation (min): 41 min Charges: 25' NMR, 16' TE Visit#: 3  of 8   Re-eval: 10/02/12    Authorization: [employer] Medicare  Authorization Time Period:    Authorization Visit#: 3  of 10    Subjective: Symptoms/Limitations Symptoms: Pt reports that she feels that her balance is doing better.  However comes in today stumbling and requires min A for stumbling.   Precautions/Restrictions     Exercise/Treatments Standing Standing Eyes Opened: Narrow base of support (BOS);3 reps;Time Standing Eyes Opened Time: 60 sec, 1 rep w/head turns Standing Eyes Closed: Narrow base of support (BOS);Wide (BOA);1 rep;Time Standing Eyes Closed Time: 60 sec both w.min guard Tandem Stance: Eyes open;3 reps;30 secs;Limitations Tandem Stance Limitations: min A w/ staggard stance Marching: Hand held assist (HHA) 2 Sit to Stand Limitations: 5 STS without HHA  Heel Raises: 15 reps;Limitations Heel Raises Limitations: toe raises x15 reps Hip ADduction: Limitations Hip ADduction Limitations: Hip Abduction x10 BLE 3# w/TC and VC's for proper positioning. Functional Squat: 20 reps;Limitations Functional Squat Limitations: w/manual facilitain for proper technique Rocker Board: 1 minute (S<>S) Seated Long Arc Quad: Both;15 reps;Weights Long Arc Quad Weight: 3 lbs.   Physical Therapy Assessment and Plan PT Assessment and Plan Clinical Impression Statement: Pt continues to require encouragment to use RW at home.  Comes in with significant stumbling and requires min A.  She has improved balance with eyes closed.  Overall has impaired LE coordination and requires max assistance to obtain proper positioning and needs encouragement to decrease impulsivity.  PT Plan: Continue with low level balance activities (feet together (eyes opened and closed), add  pertabations when ready, tandem stance, standing on step), improve LE strength, continue to encourage appropriate posture (t-band activities), eye exercises (gaze stabilzation and saccades) to improve strength and decrease dizziness.    Goals    Problem List Patient Active Problem List  Diagnosis  . DIABETES, TYPE 2  . HYPERLIPIDEMIA  . OBESITY  . THROMBOCYTOPENIA, CHRONIC  . PERIPHERAL NEUROPATHY  . HYPERTENSION  . CAD, NATIVE VESSEL  . ALLERGIC RHINITIS  . Intrinsic asthma, unspecified  . GERD  . CIRRHOSIS  . Other chronic nonalcoholic liver disease  . DYSPNEA  . ANXIETY DISORDER, HX OF  . COLONIC POLYPS, HX OF  . Hyperlipidemia  . Essential hypertension  . Uncontrolled type 2 diabetes mellitus with complication  . Posterior tibial tendon dysfunction  . Encephalopathy chronic  . Renal colic  . Anemia  . 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 Preoperative clearance  . Frequent falls  . Ataxia  . Peripheral neuropathy  . Personal history of fall  . Muscle weakness (generalized)  . Dizziness    PT - End of Session Equipment Utilized During Treatment: Gait belt Activity Tolerance: Patient tolerated treatment well;Patient limited by fatigue General Behavior During Session: [facility] for tasks performed Cognition: [facility] for tasks performed  GP    Cyncere Sontag 09/11/2012, 2:28 PM

## 2012-09-12 ENCOUNTER — Telehealth: Payer: Self-pay | Admitting: *Deleted

## 2012-09-12 NOTE — Telephone Encounter (Signed)
[name] [name] called today. She needs a prescription faxed to the [facility] for a hepatitis shot. Please follow up with her. Thanks.

## 2012-09-12 NOTE — Telephone Encounter (Signed)
Routed to refill box 

## 2012-09-12 NOTE — Telephone Encounter (Signed)
[facility]

## 2012-09-12 NOTE — Telephone Encounter (Signed)
Handwritten RX for Hep A vaccination per protocol done. [facility].

## 2012-09-16 ENCOUNTER — Ambulatory Visit (HOSPITAL_COMMUNITY)
Admission: RE | Admit: 2012-09-16 | Discharge: 2012-09-16 | Disposition: A | Discharge status: Home / Self Care | Payer: Medicare Other | Source: Ambulatory Visit | Attending: Internal Medicine | Admitting: Internal Medicine

## 2012-09-16 DIAGNOSIS — IMO0001 Reserved for inherently not codable concepts without codable children: Secondary | ICD-10-CM | POA: Insufficient documentation

## 2012-09-16 DIAGNOSIS — R269 Unspecified abnormalities of gait and mobility: Secondary | ICD-10-CM | POA: Insufficient documentation

## 2012-09-16 DIAGNOSIS — M6281 Muscle weakness (generalized): Secondary | ICD-10-CM | POA: Insufficient documentation

## 2012-09-16 DIAGNOSIS — E119 Type 2 diabetes mellitus without complications: Secondary | ICD-10-CM | POA: Insufficient documentation

## 2012-09-16 DIAGNOSIS — I10 Essential (primary) hypertension: Secondary | ICD-10-CM | POA: Insufficient documentation

## 2012-09-16 NOTE — Progress Notes (Signed)
Physical Therapy Treatment Patient Details  Name: [name] [name] MRN: [id number] Date of Birth: 13-Feb-1941  Today's Date: 09/16/2012 Time: 1338 (started by PTA (AF))-1423 (ended with PT (LM)) PT Time Calculation (min): 45 min Charges: 12' TE Visit#: 4  of 8   Re-eval: 10/02/12    Authorization: [employer] Medicare  Authorization Time Period:    Authorization Visit#: 4  of 10    Subjective: Symptoms/Limitations Symptoms: Pt. states MD found the amonia level in her blood is high from her Cirrhosis and that is what is causing her instability.  Currently on meds to help level this out.  Pt. came to therapy today without AD, though was advised by therapist she needs a RW.  Pt. very unstable. Pain Assessment Currently in Pain?: No/denies  Precautions/Restrictions     Exercise/Treatments Machines for Strengthening Cybex Knee Flexion: 2.5 PL BLE Cybex Leg Press: 4 PL x15 BLE Standing Heel Raises: 15 reps;Limitations Heel Raises Limitations: toe raises x15 reps Hip ADduction: Limitations Hip ADduction Limitations: Hip Abduction x10 BLE no weight, TC and VC's for proper positioning. Functional Squat: 15 reps Functional Squat Limitations: w/manual facilitain for proper technique Supine Bridges: 10 reps (3 sec holds) Straight Leg Raises: Both;10 reps Other Supine Knee Exercises: hip adduction 10x10 sec holds Other Supine Knee Exercises: iso hip flexion 10x5 sec holds BLE Sidelying Hip ABduction: Both;10 reps  Standing Sidestepping: Limitations Sidestepping Limitations: in paralell bars 5 RT w/VC for L foot placement   Physical Therapy Assessment and Plan PT Assessment and Plan Clinical Impression Statement: Pt continues to demonstrate impaired coordination with L LE movements.which may also be due to significant weakness.  Added LE strengthening activities to improve balance.  Discussed with pt and her husband importance of using an AD in community to decrease fall risk.  PT Plan:  Continue with low level balance activities (feet together (eyes opened and closed), add pertabations when ready, tandem stance, standing on step), improve LE strength, continue to encourage appropriate posture (t-band activities), eye exercises (gaze stabilzation and saccades) to improve strength and decrease dizziness.    Goals    Problem List Patient Active Problem List  Diagnosis  . DIABETES, TYPE 2  . HYPERLIPIDEMIA  . OBESITY  . THROMBOCYTOPENIA, CHRONIC  . PERIPHERAL NEUROPATHY  . HYPERTENSION  . CAD, NATIVE VESSEL  . ALLERGIC RHINITIS  . Intrinsic asthma, unspecified  . GERD  . CIRRHOSIS  . Other chronic nonalcoholic liver disease  . DYSPNEA  . ANXIETY DISORDER, HX OF  . COLONIC POLYPS, HX OF  . Hyperlipidemia  . Essential hypertension  . Uncontrolled type 2 diabetes mellitus with complication  . Posterior tibial tendon dysfunction  . Encephalopathy chronic  . Renal colic  . Anemia  . 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 Preoperative clearance  . Frequent falls  . Ataxia  . Peripheral neuropathy  . Personal history of fall  . Muscle weakness (generalized)  . Dizziness    PT - End of Session Equipment Utilized During Treatment: Gait belt Activity Tolerance: Patient tolerated treatment well;Patient limited by fatigue General Behavior During Session: [facility] for tasks performed Cognition: [facility] for tasks performed  GP    [name] [name] 09/16/2012, 2:31 PM

## 2012-09-17 ENCOUNTER — Ambulatory Visit (HOSPITAL_COMMUNITY): Payer: Medicare Other

## 2012-09-23 ENCOUNTER — Ambulatory Visit (HOSPITAL_COMMUNITY)
Admission: RE | Admit: 2012-09-23 | Discharge: 2012-09-23 | Disposition: A | Discharge status: Home / Self Care | Payer: Medicare Other | Source: Ambulatory Visit | Attending: Internal Medicine | Admitting: Internal Medicine

## 2012-09-23 NOTE — Progress Notes (Signed)
Physical Therapy Treatment Patient Details  Name: [name] [name] MRN: [id number] Date of Birth: 1941-10-19  Today's Date: 09/23/2012 Time: 1520-1600 PT Time Calculation (min): 40 min  Visit#: 5  of 8   Re-eval: 10/02/12  Charge: gait 10', NMR 18', therex 12'  Authorization: [employer] Medicare  Authorization Time Period:    Authorization Visit#: 5  of 10    Subjective: Symptoms/Limitations Symptoms: Pt entered dept with her RW.  Pt reported B anterior LE pain scale 6/10, reported pain reduced with exercises. Pain Assessment Currently in Pain?: Yes Pain Score:   6 Pain Location: Leg Pain Orientation: Left;Right;Anterior  Objective:   Exercise/Treatments Machines for Strengthening Cybex Knee Flexion: 2.5 PL BLE x 15 Cybex Leg Press: 4 PL x15 BLE Standing Functional Squat: 15 reps Functional Squat Limitations: w/manual facilitain for proper technique Rocker Board: 2 minutes;Limitations Rocker Board Limitations: R/L Gait Training: Gait training with RW with cueing for posture, heel to toe gait, and equal stride length Other Standing Knee Exercises: feet side by side eyes open 2x 1', eyes closed 5x 20" Other Standing Knee Exercises: tandem stance 2x30" each foot forward   Physical Therapy Assessment and Plan PT Assessment and Plan Clinical Impression Statement: Session focus on safe mechanics while ambulating with RW, improving balance and general LE strengthening.  Pt continues to demonstrate impaired coordination and decreased spatial awareness.  Progressed balance activities requiring min-mod assistance with LOB episodes and vc-ing to focus. PT Plan: Continue with low level balance activities (feet together (eyes opened and closed), add pertabations when ready, tandem stance, standing on step), improve LE strength, continue to encourage appropriate posture (t-band activities), eye exercises (gaze stabilzation and saccades) to improve strength and decrease dizziness.    Goals     Problem List Patient Active Problem List  Diagnosis  . DIABETES, TYPE 2  . HYPERLIPIDEMIA  . OBESITY  . THROMBOCYTOPENIA, CHRONIC  . PERIPHERAL NEUROPATHY  . HYPERTENSION  . CAD, NATIVE VESSEL  . ALLERGIC RHINITIS  . Intrinsic asthma, unspecified  . GERD  . CIRRHOSIS  . Other chronic nonalcoholic liver disease  . DYSPNEA  . ANXIETY DISORDER, HX OF  . COLONIC POLYPS, HX OF  . Hyperlipidemia  . Essential hypertension  . Uncontrolled type 2 diabetes mellitus with complication  . Posterior tibial tendon dysfunction  . Encephalopathy chronic  . Renal colic  . Anemia  . 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 Preoperative clearance  . Frequent falls  . Ataxia  . Peripheral neuropathy  . Personal history of fall  . Muscle weakness (generalized)  . Dizziness    PT - End of Session Equipment Utilized During Treatment: Gait belt Activity Tolerance: Patient tolerated treatment well;Patient limited by fatigue General Behavior During Session: [facility] for tasks performed Cognition: [facility] for tasks performed  GP    Juel Burrow 09/23/2012, 4:43 PM

## 2012-09-25 ENCOUNTER — Ambulatory Visit (HOSPITAL_COMMUNITY): Payer: Medicare Other | Admitting: Physical Therapy

## 2012-09-30 ENCOUNTER — Ambulatory Visit (HOSPITAL_COMMUNITY): Payer: Medicare Other | Admitting: Physical Therapy

## 2012-10-02 ENCOUNTER — Ambulatory Visit (HOSPITAL_COMMUNITY)
Admission: RE | Admit: 2012-10-02 | Discharge: 2012-10-02 | Disposition: A | Discharge status: Home / Self Care | Payer: Medicare Other | Source: Ambulatory Visit | Attending: Internal Medicine | Admitting: Internal Medicine

## 2012-10-02 NOTE — Evaluation (Signed)
Physical Therapy Re-Evaluation  Patient Details  Name: [name] [name] MRN: [id number] Date of Birth: 04/01/1941  Today's Date: 10/02/2012 Time: 6644-0347 PT Time Calculation (min): 44 min Charges: 15' TE, 15' PPT, 1 MMT, 10' Self Care Visit#: 6  of 10   Re-eval: 10/16/12 Assessment Diagnosis: Dizziness Next MD Visit:  Dr. Phillips Odor (PCP) - 1 month  Authorization: [facility] Medicare  Authorization Time Period:    Authorization Visit#: 6  of 10    Subjective Symptoms/Limitations Symptoms: Pt reports that she is doing some of her exercises.  She states that she has taking her medication to help with her balance about 1 hour before therapy today.  Comes in with her RW  Precautions/Restrictions  Precautions Precautions: Fall  Cognition/Observation Observation/Other Assessments Observations: + glossopharengeal, - occulomotor.    Sensation/Coordination/Flexibility/Functional Tests Coordination Gross Motor Movements are Fluid and Coordinated: No Coordination and Movement Description: impaired core motor control, impaired to L LE Finger Nose Finger Test: impaired coordination to L UE Heel Shin Test: impaired to L LE Functional Tests Functional Tests: ABC: 71.25% was: 42.5%  RLE Strength Right Hip Flexion: 4/5 (was 5/5) Right Hip Extension: 3+/5 (was 3-/5) Right Hip ABduction: 3+/5 (was 3+/5) Right Knee Flexion: 5/5 (was 5/5) Right Knee Extension: 5/5 (was 5/5) LLE Strength Left Hip Flexion: 4/5 (was 5/5) Left Hip Extension: 3+/5 (was 3+/5) Left Hip ABduction: 3+/5 (was 3+/5) Left Knee Flexion: 5/5 (was 5/5) Left Knee Extension: 5/5 (was 5/5)  Exercise/Treatments Mobility/Balance  Static Standing Balance Single Leg Stance - Right Leg: 0  (was 2) Single Leg Stance - Left Leg: 0  (was 3) Tandem Stance - Right Leg: 10  (w/supervision, was 3 sec) Tandem Stance - Left Leg: 3  Rhomberg - Eyes Opened:  (w/ supervison, was 3 sec)  Berg Balance Test: 43/56 Sit to Stand: Able to  stand without using hands and stabilize independently Standing Unsupported: Able to stand safely 2 minutes Sitting with Back Unsupported but Feet Supported on Floor or Stool: Able to sit safely and securely 2 minutes Stand to Sit: Sits safely with minimal use of hands Transfers: Able to transfer safely, minor use of hands Standing Unsupported with Eyes Closed: Able to stand 10 seconds safely Standing Ubsupported with Feet Together: Able to place feet together independently but unable to hold for 30 seconds From Standing, Reach Forward with Outstretched Arm: Can reach forward >12 cm safely (5") From Standing Position, Pick up Object from Floor: Able to pick up shoe, needs supervision From Standing Position, Turn to Look Behind Over each Shoulder: Looks behind from both sides and weight shifts well Turn 360 Degrees: Able to turn 360 degrees safely one side only in 4 seconds or less Standing Unsupported, Alternately Place Feet on Step/Stool: Able to complete 4 steps without aid or supervision Standing Unsupported, One Foot in Front: Needs help to step but can hold 15 seconds Standing on One Leg: Tries to lift leg/unable to hold 3 seconds but remains standing independently Total Score: 43    Supine Bridges: 20 reps Straight Leg Raises: Both;15 reps Other Supine Knee Exercises: hip adduction 10x10 sec holds Sidelying Hip ABduction: Both;15 reps;Limitations Hip ABduction Limitations: cueing for appropriate posture Prone  Hip Extension: Both;10 reps  Physical Therapy Assessment and Plan PT Assessment and Plan Clinical Impression Statement: Ms. Rocha has attended 6 OP PT visits to address dizziness.  She has improved occular strength, however continues to lack with LE and core strength.  As well as significant limitations with L UE  coordinated movements.  Pt reports taking medication to control balance disorders however continues to require assistance with most activities as she has significant  sway and does not have awareness of her balance disorder which is evident by her ABC score (71.25%),  Her Berg  improved to 43/56 and she is now ambulating with a RW which was encouraged due to her significant stumbling and  hx of falls. Pt will benefit from skilled therapeutic intervention in order to improve on the following deficits: Decreased balance;Decreased strength PT Frequency: Min 2X/week PT Duration:  (2 weeks) PT Plan: Focus on LE strengthening with standing and bed activities.  When time allows progress to balance activities to improve overall function. Continue to encourage slow and controlled movmements, especially with transitional movmements.      Goals Pt will Perform Home Exercise Program: Independently: Partly met PT Short Term Goals: 3 weeks Goal 1: Pt will report a decrease in dizziness by 50%: Progressing toward goal (taking meclazyne for dizziness) Goal 2: Pt will improve LE strength by 1 muscle grade: Progressing toward goal Goal 3: Pt will demonstrate tandem stance x10 sec on static surface. : Met Goal 4: Pt will improve core strength in order to sit with appropriate posture w/mod cueing x5 minutes. : Progressing toward goal Goal 5: Pt will demonstrate R and L stance on 6 in. step with head movements with reports of minmal dizziness.: Partly met (without dizziness) PT Long Term Goals: 6 weeks Goal 1: Pt will improve LE and core coordination and strength in order to tolerate ambulating on outdoor surface x10 minutes in order to participate in community ambulation with her family. : Partly met (able to ambulate with RW) Goal 2: Pt will improve her core strength in order to sit and stand with appropriate posture with min cueing in order to decrease her dizziness. : Met Goal 3: Pt will improve her ABC to 60% for improved percieved functional ability.  Goal 4: Pt will report 0 falls and decrease in stumbling by 75% for 3 weeks to decrease fall risk and secondary impairments.:  Progressing toward goal (25% decrease in stumbling, 0 falls) Goal 5: Pt will improve static and dynamic balance and score a 48/56 on BBS. : Progressing toward goal (43/56)  Problem List Patient Active Problem List  Diagnosis  . DIABETES, TYPE 2  . HYPERLIPIDEMIA  . OBESITY  . THROMBOCYTOPENIA, CHRONIC  . PERIPHERAL NEUROPATHY  . HYPERTENSION  . CAD, NATIVE VESSEL  . ALLERGIC RHINITIS  . Intrinsic asthma, unspecified  . GERD  . CIRRHOSIS  . Other chronic nonalcoholic liver disease  . DYSPNEA  . ANXIETY DISORDER, HX OF  . COLONIC POLYPS, HX OF  . Hyperlipidemia  . Essential hypertension  . Uncontrolled type 2 diabetes mellitus with complication  . Posterior tibial tendon dysfunction  . Encephalopathy chronic  . Renal colic  . Anemia  . 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 Preoperative clearance  . Frequent falls  . Ataxia  . Peripheral neuropathy  . Personal history of fall  . Muscle weakness (generalized)  . Dizziness    PT - End of Session Equipment Utilized During Treatment: Gait belt Activity Tolerance: Patient tolerated treatment well;Patient limited by fatigue General Behavior During Session: [facility] for tasks performed Cognition: [facility] for tasks performed PT Plan of Care PT Home Exercise Plan: updated with mat activities Consulted and Agree with Plan of Care: Patient  Annett Fabian, PT 10/02/2012, 4:32 PM  Physician Documentation Your signature is  required to indicate approval of the treatment plan as stated above.  Please sign and either send electronically or make a copy of this report for your files and return this physician signed original.   Please mark one 1.__approve of plan  2. ___approve of plan with the following conditions.   ______________________________                                                          _____________________ Physician Signature                                                                                                              Date

## 2012-10-06 ENCOUNTER — Ambulatory Visit (HOSPITAL_COMMUNITY)
Admission: RE | Admit: 2012-10-06 | Discharge: 2012-10-06 | Disposition: A | Discharge status: Home / Self Care | Payer: Medicare Other | Source: Ambulatory Visit | Attending: Internal Medicine | Admitting: Internal Medicine

## 2012-10-06 NOTE — Progress Notes (Signed)
Physical Therapy Treatment Patient Details  Name: [name] [name] MRN: [id number] Date of Birth: [birth year]  Today's Date: 10/06/2012 Time: 1191-4782 PT Time Calculation (min): 38 min Charges: 34' TE Visit#: 7  of 10   Re-eval: 10/16/12    Authorization: [employer] Medicare  Authorization Time Period:    Authorization Visit#: 7  of 10    Subjective: Symptoms/Limitations Symptoms: Pt reports she went to see Dr. Phillips Odor who reports that her ammonia levels are up which is causing her gait disturbances.  States he continues to recommend PT to improve her strength.  Pain Assessment Currently in Pain?: No/denies  Exercise/Treatments Aerobic Stationary Bike: 6 min @ 2.0 for strengthening Machines for Strengthening Cybex Knee Flexion: 3 PL x20 BLE Cybex Leg Press: 5 PL x20 BLE Standing Heel Raises: 10 reps (0 HHA, Toe Raises 10 reps 0 HHA) Functional Squat: 15 reps Functional Squat Limitations: w/manual facilitain for proper technique Supine Bridges: 20 reps (w/ball squeeze) Straight Leg Raises: Both;2 sets;10 reps;Limitations Straight Leg Raises Limitations: Floating Other Supine Knee Exercises: isometric hip flexion 2 sets 5x5 sec holds BLE  Physical Therapy Assessment and Plan PT Assessment and Plan Clinical Impression Statement: Added exercises to concentrate on improving LE strength (L>R).  Continues to demonstrate improved coordination with RLE, and impaired to LLE.  Encouraged pt to continue w/HEP to improve LE strength and function.  Pt will benefit from skilled therapeutic intervention in order to improve on the following deficits: Decreased balance;Decreased strength PT Frequency: Min 2X/week PT Duration:  (2 weeks) PT Plan: Continue to focus on LE strengthenging (L>R)     Goals    Problem List Patient Active Problem List  Diagnosis  . DIABETES, TYPE 2  . HYPERLIPIDEMIA  . OBESITY  . THROMBOCYTOPENIA, CHRONIC  . PERIPHERAL NEUROPATHY  . HYPERTENSION  . CAD, NATIVE  VESSEL  . ALLERGIC RHINITIS  . Intrinsic asthma, unspecified  . GERD  . CIRRHOSIS  . Other chronic nonalcoholic liver disease  . DYSPNEA  . ANXIETY DISORDER, HX OF  . COLONIC POLYPS, HX OF  . Hyperlipidemia  . Essential hypertension  . Uncontrolled type 2 diabetes mellitus with complication  . Posterior tibial tendon dysfunction  . Encephalopathy chronic  . Renal colic  . Anemia  . 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 Preoperative clearance  . Frequent falls  . Ataxia  . Peripheral neuropathy  . Personal history of fall  . Muscle weakness (generalized)  . Dizziness    PT - End of Session Equipment Utilized During Treatment: Gait belt Activity Tolerance: Patient tolerated treatment well;Patient limited by fatigue General Behavior During Session: [facility] for tasks performed Cognition: [facility] for tasks performed PT Plan of Care PT Home Exercise Plan: updated with mat activities Consulted and Agree with Plan of Care: Patient  GP    [name] [name] 10/06/2012, 2:29 PM

## 2012-10-08 ENCOUNTER — Inpatient Hospital Stay (HOSPITAL_COMMUNITY): Admission: RE | Admit: 2012-10-08 | Payer: Medicare Other | Source: Ambulatory Visit

## 2012-10-13 ENCOUNTER — Ambulatory Visit (HOSPITAL_COMMUNITY): Payer: Medicare Other | Admitting: Physical Therapy

## 2012-10-15 ENCOUNTER — Ambulatory Visit (HOSPITAL_COMMUNITY)
Admission: RE | Admit: 2012-10-15 | Discharge: 2012-10-15 | Disposition: A | Discharge status: Home / Self Care | Payer: Medicare Other | Source: Ambulatory Visit | Attending: Internal Medicine | Admitting: Internal Medicine

## 2012-10-15 DIAGNOSIS — E119 Type 2 diabetes mellitus without complications: Secondary | ICD-10-CM | POA: Insufficient documentation

## 2012-10-15 DIAGNOSIS — IMO0001 Reserved for inherently not codable concepts without codable children: Secondary | ICD-10-CM | POA: Insufficient documentation

## 2012-10-15 DIAGNOSIS — I10 Essential (primary) hypertension: Secondary | ICD-10-CM | POA: Insufficient documentation

## 2012-10-15 DIAGNOSIS — R269 Unspecified abnormalities of gait and mobility: Secondary | ICD-10-CM | POA: Insufficient documentation

## 2012-10-15 DIAGNOSIS — M6281 Muscle weakness (generalized): Secondary | ICD-10-CM | POA: Insufficient documentation

## 2012-10-15 NOTE — Progress Notes (Signed)
Physical Therapy Treatment Patient Details  Name: DORELLA LASTER MRN: [id number] Date of Birth: [birth year]  Today's Date: 10/15/2012 Time: 6295-2841 PT Time Calculation (min): 40 min Charges: 34' TE Visit#: 8  of 10   Re-eval: 10/16/12    Authorization: [facility] Medicare  Authorization Time Period:    Authorization Visit#: 8  of 10    Subjective: Symptoms/Limitations Symptoms: Pt reports she has fallen 2x since her last visit.  The first time on her BSC (due to mechanical issues with her chair), then when sitting on EOB fell off her bed into the closet.  She reports it is the first time she has fallen  Precautions/Restrictions     Exercise/Treatments Aerobic Stationary Bike: 8 min @ 2.0 for strengthening Machines for Strengthening Cybex Knee Flexion: 3.5 PL x20  Cybex Leg Press: 5.5 PL x20 Standing Heel Raises: 15 reps Forward Step Up: Both;10 reps;Hand Hold: 1;Step Height: 4" Gait Training: w/RW w VC and manual assistance for appropriate technique to keep walker on ground and pt centered in RW x910 feet   Physical Therapy Assessment and Plan PT Assessment and Plan Clinical Impression Statement: Pt able to maintain appropriate posture in RW at end of session. Continues to demonstrate significant weakness to BLE, however reports improved ability to ambulate overall and improved confidence.  Pt will benefit from skilled therapeutic intervention in order to improve on the following deficits: Decreased balance;Decreased strength PT Frequency: Min 2X/week PT Duration:  (2 weeks) PT Plan: Continue to focus on LE strengthenging (L>R)     Goals    Problem List Patient Active Problem List  Diagnosis  . DIABETES, TYPE 2  . HYPERLIPIDEMIA  . OBESITY  . THROMBOCYTOPENIA, CHRONIC  . PERIPHERAL NEUROPATHY  . HYPERTENSION  . CAD, NATIVE VESSEL  . ALLERGIC RHINITIS  . Intrinsic asthma, unspecified  . GERD  . CIRRHOSIS  . Other chronic nonalcoholic liver disease  . DYSPNEA  .  ANXIETY DISORDER, HX OF  . COLONIC POLYPS, HX OF  . Hyperlipidemia  . Essential hypertension  . Uncontrolled type 2 diabetes mellitus with complication  . Posterior tibial tendon dysfunction  . Encephalopathy chronic  . Renal colic  . Anemia  . 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 Preoperative clearance  . Frequent falls  . Ataxia  . Peripheral neuropathy  . Personal history of fall  . Muscle weakness (generalized)  . Dizziness    PT - End of Session Equipment Utilized During Treatment: Gait belt Activity Tolerance: Patient tolerated treatment well;Patient limited by fatigue General Behavior During Session: [facility] for tasks performed Cognition: [facility] for tasks performed  GP    Shamon Lobo 10/15/2012, 2:36 PM

## 2012-10-20 ENCOUNTER — Ambulatory Visit (HOSPITAL_COMMUNITY)
Admission: RE | Admit: 2012-10-20 | Discharge: 2012-10-20 | Disposition: A | Discharge status: Home / Self Care | Payer: Medicare Other | Source: Ambulatory Visit | Attending: Internal Medicine | Admitting: Internal Medicine

## 2012-10-20 NOTE — Progress Notes (Signed)
Physical Therapy Treatment Patient Details  Name: [name] [name] MRN: [id number] Date of Birth: 1941/11/03  Today's Date: 10/20/2012 Time: 1350-1430 PT Time Calculation (min): 40 min Charges: 30' TE Visit#: 9  of 12   Re-eval: 10/30/12 (updated from re-eval on 11/21)    Authorization: [employer] Medicare  Authorization Time Period: G-code on first visit  Authorization Visit#: 9  of 10    Subjective: Symptoms/Limitations Symptoms: Pt reports she has not been in the mood to do much lately. She reports she has not lost her balance since last visit.  Precautions/Restrictions     Exercise/Treatments Bike: 8' @ 2.0 for activity tolerance Standing Standing Eyes Opened: Wide (BOA);Time;Limitations Standing Eyes Opened Time: 5:45, 5:20,  Standing Eyes Opened Limitations: Decorating Tree   Seated Dynamic Sitting: Eyes opened;Time;Limitations Dynamic Sitting Time: 6:45 Dynamic Sitting Limitations: decorating tree with reaching to ground for ornaments   Physical Therapy Assessment and Plan PT Assessment and Plan Clinical Impression Statement: treatment consisted on improving activity tolerance with dynamic standing and sitting activities.  Pt able to stand for 5:45 and then has signficant weakness and pain to her back due to decreased core and LE strength.  Encouraged pt to continue with functional standing activities at home to improve LE strength and improve balance.  PT Frequency: Min 2X/week PT Duration:  (2 weeks) PT Plan: update G-code next visit w/score from ABC.  Continue to focus on improving LE strength.     Goals    Problem List Patient Active Problem List  Diagnosis  . DIABETES, TYPE 2  . HYPERLIPIDEMIA  . OBESITY  . THROMBOCYTOPENIA, CHRONIC  . PERIPHERAL NEUROPATHY  . HYPERTENSION  . CAD, NATIVE VESSEL  . ALLERGIC RHINITIS  . Intrinsic asthma, unspecified  . GERD  . CIRRHOSIS  . Other chronic nonalcoholic liver disease  . DYSPNEA  . ANXIETY DISORDER, HX OF  .  COLONIC POLYPS, HX OF  . Hyperlipidemia  . Essential hypertension  . Uncontrolled type 2 diabetes mellitus with complication  . Posterior tibial tendon dysfunction  . Encephalopathy chronic  . Renal colic  . Anemia  . 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 Preoperative clearance  . Frequent falls  . Ataxia  . Peripheral neuropathy  . Personal history of fall  . Muscle weakness (generalized)  . Dizziness    PT - End of Session Equipment Utilized During Treatment: Gait belt Activity Tolerance: Patient tolerated treatment well;Patient limited by fatigue General Behavior During Session: [facility] for tasks performed Cognition: [facility] for tasks performed  Jahmiya Guidotti, PT 10/20/2012, 5:04 PM

## 2012-10-22 ENCOUNTER — Ambulatory Visit (HOSPITAL_COMMUNITY)
Admission: RE | Admit: 2012-10-22 | Discharge: 2012-10-22 | Disposition: A | Discharge status: Home / Self Care | Payer: Medicare Other | Source: Ambulatory Visit | Attending: Internal Medicine | Admitting: Internal Medicine

## 2012-10-22 NOTE — Progress Notes (Signed)
Physical Therapy Treatment Patient Details  Name: TUNISHA RULAND MRN: [id number] Date of Birth: July 06, 1941  Today's Date: 10/22/2012 Time: 7829-5621 PT Time Calculation (min): 39 min  Visit#: 10  of 12   Re-eval: 10/30/12 (updated from re-eval on 11/21)  Charge: NMR 23', therex 15'  Authorization: [employer] Medicare  Authorization Time Period: G-code on first visit, and 10h visit  Authorization Visit#: 10  of 20    Subjective: Symptoms/Limitations Symptoms: Pt reports she has had no falls in last 2 weeks, last fall was from sitting on toilet.  Pt does feel her overall legs are getting stronger and balance improving slowly. Pain Assessment Currently in Pain?: No/denies  Objective:   Exercise/Treatments Aerobic Stationary Bike: 8 min @ 3.0 for strengthening Standing Heel Raises: 15 reps;Limitations Heel Raises Limitations: toe raises Functional Squat: 15 reps Functional Squat Limitations: w/manual facilitain for proper form Other Standing Knee Exercises: tandem stance 4x 30" min-mod assistance; side stepping 1RT Other Standing Knee Exercises: cone rotation  Seated Other Seated Knee Exercises: 10 STS w/o HHA     Physical Therapy Assessment and Plan PT Assessment and Plan Clinical Impression Statement: Session focus on LE functional strengthening and improving spatial awareness to improve BERG balance scale.  Pt required manual facilitation for form with standing activities and min-mod assistance for LOB episodes.  Added cone rotation to improve balance and hip mobility.  Pt continues to have significant weakness in core and LE musculature. PT Frequency: Min 2X/week PT Duration:  (2 weeks) PT Plan: update G-code next visit w/score from ABC.  Continue to focus on improving LE strength.     Goals    Problem List Patient Active Problem List  Diagnosis  . DIABETES, TYPE 2  . HYPERLIPIDEMIA  . OBESITY  . THROMBOCYTOPENIA, CHRONIC  . PERIPHERAL NEUROPATHY  . HYPERTENSION  .  CAD, NATIVE VESSEL  . ALLERGIC RHINITIS  . Intrinsic asthma, unspecified  . GERD  . CIRRHOSIS  . Other chronic nonalcoholic liver disease  . DYSPNEA  . ANXIETY DISORDER, HX OF  . COLONIC POLYPS, HX OF  . Hyperlipidemia  . Essential hypertension  . Uncontrolled type 2 diabetes mellitus with complication  . Posterior tibial tendon dysfunction  . Encephalopathy chronic  . Renal colic  . Anemia  . 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 Preoperative clearance  . Frequent falls  . Ataxia  . Peripheral neuropathy  . Personal history of fall  . Muscle weakness (generalized)  . Dizziness    PT - End of Session Equipment Utilized During Treatment: Gait belt Activity Tolerance: Patient tolerated treatment well;Patient limited by fatigue General Behavior During Session: [facility] for tasks performed Cognition: [facility] for tasks performed  GP Functional Limitation: Mobility: Walking and moving around Mobility: Walking and Moving Around Current Status ([id number]): At least 20 percent but less than 40 percent impaired, limited or restricted Mobility: Walking and Moving Around Goal Status [phone]): At least 1 percent but less than 20 percent impaired, limited or restricted  Juel Burrow 10/22/2012, 4:28 PM

## 2012-10-27 ENCOUNTER — Ambulatory Visit (HOSPITAL_COMMUNITY)
Admission: RE | Admit: 2012-10-27 | Discharge: 2012-10-27 | Disposition: A | Discharge status: Home / Self Care | Payer: Medicare Other | Source: Ambulatory Visit | Attending: Internal Medicine | Admitting: Internal Medicine

## 2012-10-27 NOTE — Progress Notes (Signed)
Physical Therapy Treatment Patient Details  Name: [name] [name] MRN: [id number] Date of Birth: [birth year]  Today's Date: 10/27/2012 Time: 1352-1440 PT Time Calculation (min): 48 min Visit#: 11  of 12   Re-eval: 10/30/12 (updated from re-eval on 11/21) Authorization: [facility] Medicare  Authorization Time Period: G-code on first visit, and 10h visit  Authorization Visit#: 11  of 20   Charges:  NMR 26', therex 15'  Subjective: Symptoms/Limitations Symptoms: Pt. states she continues to be fall-free since fall from toilet.  Pt. states she feels the cybex machines are helping the most.  No pain reported today.   Exercise/Treatments Aerobic Stationary Bike: 8 min @ 3.0 for strengthening Machines for Strengthening Cybex Knee Extension: 3.5 PL X 15 reps Cybex Knee Flexion: 5.5 PL X 15 reps Standing Heel Raises: 15 reps Heel Raises Limitations: toe raises 15 reps Lateral Step Up: 15 reps;Step Height: 4";Hand Hold: 1;Both Other Standing Knee Exercises: tandem stance 2x 30" each (4 total) min-mod assistance; side stepping 2RT Other Standing Knee Exercises: #1-15 on foam 1 RT Seated Other Seated Knee Exercises: 10 STS no UE      Physical Therapy Assessment and Plan PT Assessment and Plan Clinical Impression Statement: Added new balance activity (#'s on foam) with min-mod assist to maintain balance and safely.  Pt. somewhat impulsive at times and requires safety cues with activities.  Pt. fatigues easily requiring rest breaks between activities. Reluctant to lift R toe off floor completely with L lateral step ups. PT Plan: Re-evaluate next visit.     Problem List Patient Active Problem List  Diagnosis  . DIABETES, TYPE 2  . HYPERLIPIDEMIA  . OBESITY  . THROMBOCYTOPENIA, CHRONIC  . PERIPHERAL NEUROPATHY  . HYPERTENSION  . CAD, NATIVE VESSEL  . ALLERGIC RHINITIS  . Intrinsic asthma, unspecified  . GERD  . CIRRHOSIS  . Other chronic nonalcoholic liver disease  . DYSPNEA  .  ANXIETY DISORDER, HX OF  . COLONIC POLYPS, HX OF  . Hyperlipidemia  . Essential hypertension  . Uncontrolled type 2 diabetes mellitus with complication  . Posterior tibial tendon dysfunction  . Encephalopathy chronic  . Renal colic  . Anemia  . 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 Preoperative clearance  . Frequent falls  . Ataxia  . Peripheral neuropathy  . Personal history of fall  . Muscle weakness (generalized)  . Dizziness    PT - End of Session Equipment Utilized During Treatment: Gait belt Activity Tolerance: Patient tolerated treatment well;Patient limited by fatigue General Behavior During Session: [facility] for tasks performed Cognition: [facility] for tasks performed   Lurena Nida, PTA/CLT 10/27/2012, 2:54 PM

## 2012-10-29 ENCOUNTER — Ambulatory Visit (HOSPITAL_COMMUNITY)
Admission: RE | Admit: 2012-10-29 | Discharge: 2012-10-29 | Disposition: A | Discharge status: Home / Self Care | Payer: Medicare Other | Source: Ambulatory Visit | Attending: Internal Medicine | Admitting: Internal Medicine

## 2012-10-29 NOTE — Evaluation (Signed)
Physical Therapy discharge and treatment Patient Details  Name: [name] [name] MRN: [id number] Date of Birth: 1941/01/09  Today's Date: 10/29/2012 Time: 0272-5366 PT Time Calculation (min): 36 min Charges: 1 MMT, 15' self care, 10' NMR, 5' TE Visit#: 12  of 12   Re-eval: 10/30/12 (updated from re-eval on 11/21) Assessment Diagnosis: Dizziness Next MD Visit:  Dr. Phillips Odor (PCP) - unscheduled  Authorization: [facility] Medicare  Authorization Time Period: G-code on first visit, and 10h visit  Authorization Visit#: 12  of 20    Subjective Symptoms/Limitations Symptoms: I have been able to decorate my home for Christmas.  I still feel like I cannot do what I want to because my husband will not let me do anything on my own.   Precautions/Restrictions  Precautions Precautions: Fall  Sensation/Coordination/Flexibility/Functional Tests Functional Tests Functional Tests: ABC 83.75% (was 71.25%)  Assessment RLE Strength Right Hip Flexion:  (4+/5 (was 4/5)) Right Hip Extension: 4/5 (was 3+/5) Right Hip ABduction: 4/5 (was 3+/5) Right Knee Flexion: 5/5 (was 5/5) Right Knee Extension: 5/5 (was 5/5) LLE Strength Left Hip Flexion: 4/5 (was 4/5) Left Hip Extension: 4/5 (was 3+/5) Left Hip ABduction: 4/5 (was 3+/5) Left Knee Flexion: 5/5 (was 5/5) Left Knee Extension: 5/5 (was 5/5)  Exercise/Treatments Mobility/Balance  Static Standing Balance Single Leg Stance - Right Leg: 0  Single Leg Stance - Left Leg: 0  Tandem Stance - Right Leg: 5  Tandem Stance - Left Leg: 5  Rhomberg - Eyes Opened: 60  (was 10) Berg Balance Test Sit to Stand: Able to stand without using hands and stabilize independently Standing Unsupported: Able to stand safely 2 minutes Sitting with Back Unsupported but Feet Supported on Floor or Stool: Able to sit safely and securely 2 minutes Stand to Sit: Sits safely with minimal use of hands Transfers: Able to transfer safely, minor use of hands Standing Unsupported  with Eyes Closed: Able to stand 10 seconds safely Standing Ubsupported with Feet Together: Able to place feet together independently and stand 1 minute safely From Standing, Reach Forward with Outstretched Arm: Can reach forward >12 cm safely (5") From Standing Position, Pick up Object from Floor: Able to pick up shoe, needs supervision From Standing Position, Turn to Look Behind Over each Shoulder: Looks behind from both sides and weight shifts well Turn 360 Degrees: Able to turn 360 degrees safely one side only in 4 seconds or less Standing Unsupported, Alternately Place Feet on Step/Stool: Able to complete >2 steps/needs minimal assist Standing Unsupported, One Foot in Front: Loses balance while stepping or standing Standing on One Leg: Unable to try or needs assist to prevent fall Total Score: 42    Standing  Hip extension: 2x10 BLE Hip Abduction 2x10 BLE Toe Raises x20 Heel Raises x10 Foot on Step: 6 inches 2x30 sec hold BLE Static Standing: solid surface x1 minute w/pertabations  Discussed with pt in great detail about importance to continue to socialize and get out in the community to decrease depression.   Physical Therapy Assessment and Plan PT Assessment and Plan Clinical Impression Statement: [name] [name] has attended 12 OP PT visits with following findings: has met 2/5 STG and 2/5 LTG, Berg: 42/56 (decreased) continues to have greatest difficulty with L LE balanance and strength, has significant dizziness related to increased ammonia levels, mod complaince with HEP (however increased depression about laying around house does not motivate her).  She feels as if overall she has improved her balance and is now walking safely with a RW and  has improved her LE strength in order to help prevent falls.  At this time recommend f/u w/MD to discuss continued dizziness.  PT Duration:  (2 weeks) PT Plan: D/C    Goals Home Exercise Program Pt will Perform Home Exercise Program:  Independently PT Goal: Perform Home Exercise Program - Progress: Partly met PT Short Term Goals Time to Complete Short Term Goals: 3 weeks PT Short Term Goal 1: Pt will report a decrease in dizziness by 50% PT Short Term Goal 1 - Progress: Not met (Continues to be dizzy 75% of her day. ) PT Short Term Goal 2: Pt will improve LE strength by 1 muscle grade.  PT Short Term Goal 2 - Progress: Met PT Short Term Goal 3: Pt will demonstrate tandem stance x10 sec on static surface.  PT Short Term Goal 3 - Progress: Progressing toward goal PT Short Term Goal 4: Pt will improve core strength in order to sit with appropriate posture w/mod cueing x5 minutes.  PT Short Term Goal 4 - Progress: Met PT Short Term Goal 5: Pt will demonstrate R and L stance on 6 in. step with head movements with reports of minmal dizziness. PT Short Term Goal 5 - Progress: Progressing toward goal PT Long Term Goals Time to Complete Long Term Goals: Other (comment) (6 weeks) PT Long Term Goal 1: Pt will improve LE and core coordination and strength in order to tolerate ambulating on outdoor surface x10 minutes in order to participate in community ambulation with her family.  PT Long Term Goal 1 - Progress: Met (10 minutes with RW) PT Long Term Goal 2: Pt will improve her core strength in order to sit and stand with appropriate posture with min cueing in order to decrease her dizziness.  PT Long Term Goal 2 - Progress: Progressing toward goal Long Term Goal 3: Pt will improve her ABC to 60% for improved percieved functional ability.  Long Term Goal 3 Progress: Met Long Term Goal 4: Pt will report 0 falls and decrease in stumbling by 75% for 3 weeks to decrease fall risk and secondary impairments.  Long Term Goal 4 Progress: Progressing toward goal PT Long Term Goal 5: Pt will improve static and dynamic balance and score a 48/56 on BBS.  Long Term Goal 5 Progress: Not met (42/56)  Problem List Patient Active Problem List   Diagnosis  . DIABETES, TYPE 2  . HYPERLIPIDEMIA  . OBESITY  . THROMBOCYTOPENIA, CHRONIC  . PERIPHERAL NEUROPATHY  . HYPERTENSION  . CAD, NATIVE VESSEL  . ALLERGIC RHINITIS  . Intrinsic asthma, unspecified  . GERD  . CIRRHOSIS  . Other chronic nonalcoholic liver disease  . DYSPNEA  . ANXIETY DISORDER, HX OF  . COLONIC POLYPS, HX OF  . Hyperlipidemia  . Essential hypertension  . Uncontrolled type 2 diabetes mellitus with complication  . Posterior tibial tendon dysfunction  . Encephalopathy chronic  . Renal colic  . Anemia  . 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 Preoperative clearance  . Frequent falls  . Ataxia  . Peripheral neuropathy  . Personal history of fall  . Muscle weakness (generalized)  . Dizziness    PT - End of Session Equipment Utilized During Treatment: Gait belt Activity Tolerance: Patient tolerated treatment well;Patient limited by fatigue General Behavior During Session: [facility] for tasks performed Cognition: [facility] for tasks performed  GP Functional Assessment Tool Used: ABC: 83.75% Functional Limitation: Mobility: Walking and moving around Mobility: Walking and Moving Around  Goal Status [phone]): At least 1 percent but less than 20 percent impaired, limited or restricted Mobility: Walking and Moving Around Discharge Status [phone]): At least 1 percent but less than 20 percent impaired, limited or restricted  Damya Comley 10/29/2012, 2:38 PM  Physician Documentation Your signature is required to indicate approval of the treatment plan as stated above.  Please sign and either send electronically or make a copy of this report for your files and return this physician signed original.   Please mark one 1.__approve of plan  2. ___approve of plan with the following conditions.   ______________________________                                                          _____________________ Physician Signature                                                                                                              Date

## 2012-11-11 ENCOUNTER — Telehealth: Payer: Self-pay | Admitting: *Deleted

## 2012-11-11 NOTE — Telephone Encounter (Signed)
Pt on Nov recall for ABD U/S in 09/2012

## 2012-11-11 NOTE — Telephone Encounter (Signed)
Patient had U/S in Oct 2013 Results says to repeat in 6 months which will be April 2014 will send reminder then

## 2012-11-18 ENCOUNTER — Other Ambulatory Visit (HOSPITAL_COMMUNITY): Payer: Self-pay | Admitting: Family Medicine

## 2012-11-18 ENCOUNTER — Ambulatory Visit (HOSPITAL_COMMUNITY)
Admission: RE | Admit: 2012-11-18 | Discharge: 2012-11-18 | Disposition: A | Discharge status: Home / Self Care | Payer: Medicare Other | Source: Ambulatory Visit | Attending: Family Medicine | Admitting: Family Medicine

## 2012-11-18 DIAGNOSIS — J069 Acute upper respiratory infection, unspecified: Secondary | ICD-10-CM | POA: Insufficient documentation

## 2012-11-18 DIAGNOSIS — R059 Cough, unspecified: Secondary | ICD-10-CM | POA: Insufficient documentation

## 2012-11-18 DIAGNOSIS — R062 Wheezing: Secondary | ICD-10-CM | POA: Insufficient documentation

## 2012-11-18 DIAGNOSIS — R05 Cough: Secondary | ICD-10-CM | POA: Insufficient documentation

## 2012-12-01 ENCOUNTER — Other Ambulatory Visit: Payer: Self-pay

## 2012-12-01 DIAGNOSIS — K7460 Unspecified cirrhosis of liver: Secondary | ICD-10-CM

## 2012-12-04 ENCOUNTER — Encounter (HOSPITAL_COMMUNITY): Payer: Self-pay | Admitting: Emergency Medicine

## 2012-12-04 ENCOUNTER — Emergency Department (HOSPITAL_COMMUNITY): Payer: Medicare Other

## 2012-12-04 ENCOUNTER — Emergency Department (HOSPITAL_COMMUNITY)
Admission: EM | Admit: 2012-12-04 | Discharge: 2012-12-04 | Disposition: A | Discharge status: Home / Self Care | Payer: Medicare Other | Attending: Emergency Medicine | Admitting: Emergency Medicine

## 2012-12-04 DIAGNOSIS — W108XXA Fall (on) (from) other stairs and steps, initial encounter: Secondary | ICD-10-CM | POA: Insufficient documentation

## 2012-12-04 DIAGNOSIS — Z87898 Personal history of other specified conditions: Secondary | ICD-10-CM | POA: Insufficient documentation

## 2012-12-04 DIAGNOSIS — S0511XA Contusion of eyeball and orbital tissues, right eye, initial encounter: Secondary | ICD-10-CM

## 2012-12-04 DIAGNOSIS — Z85038 Personal history of other malignant neoplasm of large intestine: Secondary | ICD-10-CM | POA: Insufficient documentation

## 2012-12-04 DIAGNOSIS — I10 Essential (primary) hypertension: Secondary | ICD-10-CM | POA: Insufficient documentation

## 2012-12-04 DIAGNOSIS — E785 Hyperlipidemia, unspecified: Secondary | ICD-10-CM | POA: Insufficient documentation

## 2012-12-04 DIAGNOSIS — Z9181 History of falling: Secondary | ICD-10-CM | POA: Insufficient documentation

## 2012-12-04 DIAGNOSIS — E119 Type 2 diabetes mellitus without complications: Secondary | ICD-10-CM | POA: Insufficient documentation

## 2012-12-04 DIAGNOSIS — Z7982 Long term (current) use of aspirin: Secondary | ICD-10-CM | POA: Insufficient documentation

## 2012-12-04 DIAGNOSIS — S0510XA Contusion of eyeball and orbital tissues, unspecified eye, initial encounter: Secondary | ICD-10-CM | POA: Insufficient documentation

## 2012-12-04 DIAGNOSIS — R42 Dizziness and giddiness: Secondary | ICD-10-CM | POA: Insufficient documentation

## 2012-12-04 DIAGNOSIS — S8002XA Contusion of left knee, initial encounter: Secondary | ICD-10-CM

## 2012-12-04 DIAGNOSIS — S8000XA Contusion of unspecified knee, initial encounter: Secondary | ICD-10-CM | POA: Insufficient documentation

## 2012-12-04 DIAGNOSIS — J45909 Unspecified asthma, uncomplicated: Secondary | ICD-10-CM | POA: Insufficient documentation

## 2012-12-04 DIAGNOSIS — Z79899 Other long term (current) drug therapy: Secondary | ICD-10-CM | POA: Insufficient documentation

## 2012-12-04 DIAGNOSIS — Z8709 Personal history of other diseases of the respiratory system: Secondary | ICD-10-CM | POA: Insufficient documentation

## 2012-12-04 DIAGNOSIS — Y929 Unspecified place or not applicable: Secondary | ICD-10-CM | POA: Insufficient documentation

## 2012-12-04 DIAGNOSIS — K219 Gastro-esophageal reflux disease without esophagitis: Secondary | ICD-10-CM | POA: Insufficient documentation

## 2012-12-04 DIAGNOSIS — Z8669 Personal history of other diseases of the nervous system and sense organs: Secondary | ICD-10-CM | POA: Insufficient documentation

## 2012-12-04 DIAGNOSIS — I2510 Atherosclerotic heart disease of native coronary artery without angina pectoris: Secondary | ICD-10-CM | POA: Insufficient documentation

## 2012-12-04 DIAGNOSIS — Z8719 Personal history of other diseases of the digestive system: Secondary | ICD-10-CM | POA: Insufficient documentation

## 2012-12-04 DIAGNOSIS — E669 Obesity, unspecified: Secondary | ICD-10-CM | POA: Insufficient documentation

## 2012-12-04 DIAGNOSIS — W19XXXA Unspecified fall, initial encounter: Secondary | ICD-10-CM

## 2012-12-04 DIAGNOSIS — Z794 Long term (current) use of insulin: Secondary | ICD-10-CM | POA: Insufficient documentation

## 2012-12-04 DIAGNOSIS — S8001XA Contusion of right knee, initial encounter: Secondary | ICD-10-CM

## 2012-12-04 DIAGNOSIS — Y9301 Activity, walking, marching and hiking: Secondary | ICD-10-CM | POA: Insufficient documentation

## 2012-12-04 LAB — COMPREHENSIVE METABOLIC PANEL
ALT: 12 U/L (ref 0–35)
Alkaline Phosphatase: 111 U/L (ref 39–117)
BUN: 9 mg/dL (ref 6–23)
CO2: 24 mEq/L (ref 19–32)
Chloride: 100 mEq/L (ref 96–112)
GFR calc Af Amer: >90 mL/min (ref >90)
GFR calc non Af Amer: >90 mL/min (ref >90)
Glucose, Bld: 489 mg/dL — ABNORMAL HIGH (ref 70–99)
Potassium: 4.1 mEq/L (ref 3.5–5.1)
Sodium: 134 mEq/L — ABNORMAL LOW (ref 135–145)
Total Bilirubin: 2.6 mg/dL — ABNORMAL HIGH (ref 0.3–1.2)

## 2012-12-04 LAB — URINALYSIS, ROUTINE W REFLEX MICROSCOPIC
Bilirubin Urine: NEGATIVE
Hgb urine dipstick: NEGATIVE
Ketones, ur: NEGATIVE mg/dL
Protein, ur: NEGATIVE mg/dL
Specific Gravity, Urine: 1.02 (ref 1.005–1.030)
Urobilinogen, UA: 0.2 mg/dL (ref 0.0–1.0)

## 2012-12-04 LAB — PROTIME-INR: INR: 1.16 (ref 0.00–1.49)

## 2012-12-04 LAB — CBC WITH DIFFERENTIAL/PLATELET
Basophils Absolute: 0 10*3/uL (ref 0.0–0.1)
Basophils Relative: 1 % (ref 0–1)
Eosinophils Absolute: 0.1 10*3/uL (ref 0.0–0.7)
MCHC: 35.2 g/dL (ref 30.0–36.0)
Monocytes Absolute: 0.3 10*3/uL (ref 0.1–1.0)
Neutro Abs: 2.7 10*3/uL (ref 1.7–7.7)
Neutrophils Relative %: 64 % (ref 43–77)
RDW: 14.1 % (ref 11.5–15.5)

## 2012-12-04 LAB — AMMONIA: Ammonia: 54 umol/L (ref 11–60)

## 2012-12-04 LAB — URINE MICROSCOPIC-ADD ON

## 2012-12-04 MED ORDER — HYDROCODONE-ACETAMINOPHEN 5-325 MG PO TABS
ORAL_TABLET | ORAL | Status: AC
  Administered 2012-12-04: 1
  Filled 2012-12-04: qty 1

## 2012-12-04 MED ORDER — HYDROCODONE-ACETAMINOPHEN 5-325 MG PO TABS: 1.0000 | ORAL_TABLET | ORAL | Status: DC | PRN

## 2012-12-04 MED ORDER — INSULIN ASPART 100 UNIT/ML ~~LOC~~ SOLN
10.0000 [IU] | SUBCUTANEOUS | Status: AC
  Administered 2012-12-04: 10 [IU] via SUBCUTANEOUS
  Filled 2012-12-04: qty 1

## 2012-12-04 MED ORDER — HYDROCODONE-ACETAMINOPHEN 5-325 MG PO TABS
1.0000 | ORAL_TABLET | Freq: Once | ORAL | Status: AC
  Administered 2012-12-04: 1 via ORAL

## 2012-12-04 NOTE — ED Notes (Signed)
Pt back from xray.  Lab notified of pts return.

## 2012-12-04 NOTE — ED Provider Notes (Signed)
History   This chart was scribed for [name] Booze, MD by Sofie Rower, ED Scribe. The patient was seen in room APA03/APA03 and the patient's care was started at 3:17PM.     CSN: [id number]  Arrival date & time 12/04/12  1420   First MD Initiated Contact with Patient 12/04/12 1517      Chief Complaint  Patient presents with  . Fall    (Consider location/radiation/quality/duration/timing/severity/associated sxs/prior treatment) Patient is a 72 y.o. female presenting with fall. The history is provided by the patient and the spouse. No language interpreter was used.  Fall The accident occurred 6 to 12 hours ago. The fall occurred while walking (While walking up an outdoor staircase. ). She fell from an unknown height. She landed on concrete. The volume of blood lost was minimal. The point of impact was the head, right knee and left knee. The pain is present in the head. The pain is at a severity of 10/10. The pain is moderate. She was ambulatory at the scene. There was no entrapment after the fall. There was no drug use involved in the accident. There was no alcohol use involved in the accident. Associated symptoms include visual change, headaches and loss of consciousness. The symptoms are aggravated by pressure on the injury, extension and flexion. She has tried ice for the symptoms. The treatment provided no relief.    [name] [name] is a 72 y.o. female , with a hx of hepatic encephalopathy, hypertension and arthwritis, who presents to the Emergency Department complaining of sudden, moderate, fall, onset today (12/04/12).  Associated symptoms include LOC, headache, blurred vision located at the right eye, and bilateral knee pain. The pt's husband reports the pt suddenly slipped, fell, and impacted directly upon the right side of her head while walking up a staircase earlier this afternoon. The pt rates her headache at a 10/10 at present. In addition, the pt's husband informs that the pt has been  increasingly sleepy as of late, in comparison to her baseline. Modifying factors include certain movements and positions of the lower extremities which intensifies the knee pain.  The pt does not smoke or drink alcohol.   PCP is Dr. Phillips Odor.    Past Medical History  Diagnosis Date  . CAD in native artery   . Thrombocytopenia, unspecified   . Unspecified hereditary and idiopathic peripheral neuropathy   . Unspecified fall   . Other and unspecified hyperlipidemia   . Unspecified essential hypertension   . Obesity, unspecified   . Shortness of breath   . Personal history of neurosis   . Intrinsic asthma, unspecified   . Allergic rhinitis, cause unspecified   . Cirrhosis of liver     NASH, afp on 06/17/12 =3.7, per pt she had hep B vaccines in 1996  . Colon adenomas 03/08/10    tcs by Dr. Jena Gauss  . Hyperplastic polyps of stomach 03/08/10    tcs by Dr. Geni Bers  . Chronic gastritis 03/09/11    egd by Dr. Jena Gauss  . History of hemorrhoids 03/08/10    tcs- internal and external  . Diverticula of colon 03/08/10    L side  . Hiatal hernia 03/08/10  . Esophagitis, erosive 03/08/10  . GERD (gastroesophageal reflux disease)   . Wears glasses   . Type II or unspecified type diabetes mellitus without mention of complication, not stated as uncontrolled     Past Surgical History  Procedure Date  . Hysterectomy and btl     s/p  .  Abdominal hysterectomy   . Tumor excision 2003    rt arm and left foot  . Colonoscopy 03/08/10    Dr. [name]-->ext/int hemorrhoids, anal paipilla, rectal polyps, desc polyps, cecal polyp, left-sided diverticula. suboptiomal prep. next TCS 02/2015. multiple adenomas  . Esophagogastroduodenoscopy 03/08/10    Dr. Dellie Catholic erosive RE, small hh, antral erosions, small 1cm are of mucosal indentation along gastric body of doubtful significance, cystic nodularity of hypophyarynx, base of tongue, base of epiglottis. benign gastric biopsies  . Tubal ligation   .  Esophagogastroduodenoscopy 10/08/2011    Dr. Dionisio  esophageal varices, antral erosion. next egd 09/2013  . Foot surgery 2003    lt foot  . Breast surgery   . Eye surgery     cataracts    Family History  Problem Relation Age of Onset  . Coronary artery disease      FH  . Diabetes      FH  . Heart disease Mother   . Colon cancer Neg Hx     History  Substance Use Topics  . Smoking status: Never Smoker   . Smokeless tobacco: Not on file  . Alcohol Use: No    OB History    Grav Para Term Preterm Abortions TAB SAB Ect Mult Living                  Review of Systems  Musculoskeletal: Positive for arthralgias.  Neurological: Positive for loss of consciousness and headaches.  All other systems reviewed and are negative.    Allergies  Prochlorperazine edisylate and Compazine  Home Medications   Current Outpatient Rx  Name  Route  Sig  Dispense  Refill  . ALPRAZOLAM 0.5 MG PO TABS   Oral   Take 1 tablet (0.5 mg total) by mouth 3 (three) times daily as needed for sleep. For anxiety         . AMOXICILLIN-POT CLAVULANATE 875-125 MG PO TABS   Oral   Take 1 tablet by mouth 2 (two) times daily. For 10 days         . ASPIRIN 81 MG PO TBEC   Oral   Take 81 mg by mouth daily.           . BUDESONIDE-FORMOTEROL FUMARATE 160-4.5 MCG/ACT IN AERO   Inhalation   Inhale 2 puffs into the lungs 2 (two) times daily.           Marland Kitchen CLOTRIMAZOLE 1 % EX CREA   Topical   Apply topically 2 (two) times daily.   30 g   0   . ENALAPRIL MALEATE 5 MG PO TABS   Oral   Take 5 mg by mouth daily.           Marland Kitchen ESOMEPRAZOLE MAGNESIUM 40 MG PO CPDR   Oral   Take 40 mg by mouth daily before breakfast.           . INSULIN ASPART 100 UNIT/ML [place] SOLN   Subcutaneous   Inject 10-40 Units into the skin daily as needed. Per sliding scale. Patient injects 10-40 units for levels above 150 to 300          . INSULIN DETEMIR 100 UNIT/ML [place] SOLN   Subcutaneous   Inject 60 Units into  the skin at bedtime.          Marland Kitchen LACTULOSE 10 GM/15ML PO SOLN   Oral   Take 60 mLs by mouth daily.          Marland Kitchen  LINAGLIPTIN 5 MG PO TABS   Oral   Take 5 mg by mouth daily.         Marland Kitchen MECLIZINE HCL 25 MG PO TABS   Oral   Take 25 mg by mouth 3 (three) times daily as needed. For dizziness/nausea         . CENTRUM SILVER PO   Oral   Take 1 tablet by mouth daily.          Marland Kitchen NAPROXEN SODIUM 220 MG PO TABS   Oral   Take 440 mg by mouth daily as needed. For pain         . PREGABALIN 100 MG PO CAPS   Oral   Take 100 mg by mouth 2 (two) times daily.          Marland Kitchen CIPROFLOXACIN HCL 500 MG PO TABS   Oral   Take 500 mg by mouth 2 (two) times daily. For 7 days           BP 120/59  Pulse 77  Temp 97.9 F (36.6 C) (Oral)  Ht 5\' 8"  (1.727 m)  Wt 230 lb (104.327 kg)  BMI 34.97 kg/m2  SpO2 100%  Physical Exam  Nursing note and vitals reviewed. Constitutional: She is oriented to person, place, and time. She appears well-developed and well-nourished. No distress.  HENT:  Head: Normocephalic. Head is with abrasion.  Nose: Nose normal.  Eyes: EOM are normal. Pupils are equal, round, and reactive to light.       3 cm hematoma right periorbital with abrasion. No orbital rim step off.   Neck: Neck supple. No tracheal deviation present.       Neck mildly tender of the cervical spine.   Cardiovascular: Normal rate, regular rhythm and normal heart sounds.   Pulmonary/Chest: Effort normal and breath sounds normal. No respiratory distress.       Ecchymosis over the left breast.   Abdominal: Soft. Bowel sounds are normal. She exhibits no distension.  Musculoskeletal: Normal range of motion. She exhibits no edema.       Cervical back: She exhibits tenderness.       Left forearm : Ecchymosis of the left forearm. Bilateral Knees : Abrasions over both knees. No swelling, no deformity, nor effusion. Mild to moderate pain with passive ROM.   Neurological: She is alert and oriented to  person, place, and time. No sensory deficit.  Skin: Skin is warm and dry.  Psychiatric: She has a normal mood and affect. Her behavior is normal.    ED Course  Procedures (including critical care time)  DIAGNOSTIC STUDIES: Oxygen Saturation is 100% on room air, normal by my interpretation.    COORDINATION OF CARE:  3:23 PM- Treatment plan concerning x-ray of knees and head discussed with patient. Pt agrees with treatment.      Results for orders placed during the hospital encounter of 12/04/12  CBC WITH DIFFERENTIAL      Component Value Range   WBC 4.2  4.0 - 10.5 K/uL   RBC 4.95  3.87 - 5.11 MIL/uL   Hemoglobin 14.5  12.0 - 15.0 g/dL   HCT 96.0  45.4 - 09.8 %   MCV 83.2  78.0 - 100.0 fL   MCH 29.3  26.0 - 34.0 pg   MCHC 35.2  30.0 - 36.0 g/dL   RDW 11.9  14.7 - 82.9 %   Platelets 110 (*) 150 - 400 K/uL   Neutrophils Relative 64  43 - 77 %  Neutro Abs 2.7  1.7 - 7.7 K/uL   Lymphocytes Relative 26  12 - 46 %   Lymphs Abs 1.1  0.7 - 4.0 K/uL   Monocytes Relative 7  3 - 12 %   Monocytes Absolute 0.3  0.1 - 1.0 K/uL   Eosinophils Relative 2  0 - 5 %   Eosinophils Absolute 0.1  0.0 - 0.7 K/uL   Basophils Relative 1  0 - 1 %   Basophils Absolute 0.0  0.0 - 0.1 K/uL  PROTIME-INR      Component Value Range   Prothrombin Time 14.6  11.6 - 15.2 seconds   INR 1.16  0.00 - 1.49  COMPREHENSIVE METABOLIC PANEL      Component Value Range   Sodium 134 (*) 135 - 145 mEq/L   Potassium 4.1  3.5 - 5.1 mEq/L   Chloride 100  96 - 112 mEq/L   CO2 24  19 - 32 mEq/L   Glucose, Bld 489 (*) 70 - 99 mg/dL   BUN 9  6 - 23 mg/dL   Creatinine, Ser 1.61  0.50 - 1.10 mg/dL   Calcium 9.2  8.4 - 09.6 mg/dL   Total Protein 6.3  6.0 - 8.3 g/dL   Albumin 3.5  3.5 - 5.2 g/dL   AST 19  0 - 37 U/L   ALT 12  0 - 35 U/L   Alkaline Phosphatase 111  39 - 117 U/L   Total Bilirubin 2.6 (*) 0.3 - 1.2 mg/dL   GFR calc non Af Amer >90  >90 mL/min   GFR calc Af Amer >90  >90 mL/min  AMMONIA       Component Value Range   Ammonia 54  11 - 60 umol/L  URINALYSIS, ROUTINE W REFLEX MICROSCOPIC      Component Value Range   Color, Urine YELLOW  YELLOW   APPearance CLEAR  CLEAR   Specific Gravity, Urine 1.020  1.005 - 1.030   pH 5.5  5.0 - 8.0   Glucose, UA >1000 (*) NEGATIVE mg/dL   Hgb urine dipstick NEGATIVE  NEGATIVE   Bilirubin Urine NEGATIVE  NEGATIVE   Ketones, ur NEGATIVE  NEGATIVE mg/dL   Protein, ur NEGATIVE  NEGATIVE mg/dL   Urobilinogen, UA 0.2  0.0 - 1.0 mg/dL   Nitrite NEGATIVE  NEGATIVE   Leukocytes, UA NEGATIVE  NEGATIVE  URINE MICROSCOPIC-ADD ON      Component Value Range   Squamous Epithelial / LPF MANY (*) RARE   WBC, UA 7-10  <3 WBC/hpf   RBC / HPF 0-2  <3 RBC/hpf   Urine-Other YEAST      Ct Head Wo Contrast  12/04/2012  *RADIOLOGY REPORT*  Clinical Data:  Larey Seat on outside steps striking right side of head on concrete, hematoma lateral to right eye, visual changes, headache, loss of consciousness, somnolent, posterior neck pain, history diabetes, hypertension, coronary artery disease  CT HEAD WITHOUT CONTRAST CT MAXILLOFACIAL WITHOUT CONTRAST CT CERVICAL SPINE WITHOUT CONTRAST  Technique:  Multidetector CT imaging of the head, cervical spine, and maxillofacial structures were performed using the standard protocol without intravenous contrast. Multiplanar CT image reconstructions of the cervical spine and maxillofacial structures were also generated.  Comparison:  CT head 08/07/2012, CT cervical spine 01/21/2010  CT HEAD  Findings: Generalized atrophy. Normal ventricular morphology. No midline shift or mass effect. Minimal small vessel chronic ischemic changes of deep cerebral white matter. Few streak artifacts at skull base. No definite intracranial hemorrhage, mass lesion, or  evidence of acute infarction. No extra-axial fluid collections. Scalp hematoma identified lateral to the right supraorbital ridge. Calvaria intact. Small right parietal lucency image 59 unchanged.   IMPRESSION: Atrophy with minimal small vessel chronic ischemic changes of deep cerebral white matter. No acute intracranial abnormalities.  CT MAXILLOFACIAL  Findings: Visualized intracranial contents significant only for generalized atrophy. Tiny osteoma of the right ethmoid air cells. Paranasal sinuses, mastoid air cells and middle ear cavities otherwise clear. Intraorbital soft tissue planes clear. Focal hematoma lateral to the superior right orbit. Minimal nasal septal deviation to the right with aeration of bilateral middle turbinates. No facial bone fractures identified.  IMPRESSION:  No acute facial bony abnormalities.  CT CERVICAL SPINE  Findings: Degradation of image quality at inferior cervical spine secondary to beam hardening from the patients shoulders. Visualized skull base intact. Prevertebral soft tissues normal thickness. Disc space narrowing with endplate spur formation at C5-C6 and C6- C7. Uncovertebral spurs encroach upon bilateral cervical neural foramina at C6-C7 as well as at right C5-C6. Vertebral body heights maintained without fracture or subluxation. AP narrowing of spinal canal at C5-C6 and C6-C7. Visualized lung apices clear.  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 No definite acute cervical spine abnormalities identified.   Original Report Authenticated By: Ulyses Southward, M.D.    Ct Cervical Spine Wo Contrast  12/04/2012  *RADIOLOGY REPORT*  Clinical Data:  Larey Seat on outside steps striking right side of head on concrete, hematoma lateral to right eye, visual changes, headache, loss of consciousness, somnolent, posterior neck pain, history diabetes, hypertension, coronary artery disease  CT HEAD WITHOUT CONTRAST CT MAXILLOFACIAL WITHOUT CONTRAST CT CERVICAL SPINE WITHOUT CONTRAST  Technique:  Multidetector CT imaging of the head, cervical spine, and maxillofacial structures were performed using the standard protocol without intravenous contrast.  Multiplanar CT image reconstructions of the cervical spine and maxillofacial structures were also generated.  Comparison:  CT head 08/07/2012, CT cervical spine 01/21/2010  CT HEAD  Findings: Generalized atrophy. Normal ventricular morphology. No midline shift or mass effect. Minimal small vessel chronic ischemic changes of deep cerebral white matter. Few streak artifacts at skull base. No definite intracranial hemorrhage, mass lesion, or evidence of acute infarction. No extra-axial fluid collections. Scalp hematoma identified lateral to the right supraorbital ridge. Calvaria intact. Small right parietal lucency image 59 unchanged.  IMPRESSION: Atrophy with minimal small vessel chronic ischemic changes of deep cerebral white matter. No acute intracranial abnormalities.  CT MAXILLOFACIAL  Findings: Visualized intracranial contents significant only for generalized atrophy. Tiny osteoma of the right ethmoid air cells. Paranasal sinuses, mastoid air cells and middle ear cavities otherwise clear. Intraorbital soft tissue planes clear. Focal hematoma lateral to the superior right orbit. Minimal nasal septal deviation to the right with aeration of bilateral middle turbinates. No facial bone fractures identified.  IMPRESSION:  No acute facial bony abnormalities.  CT CERVICAL SPINE  Findings: Degradation of image quality at inferior cervical spine secondary to beam hardening from the patients shoulders. Visualized skull base intact. Prevertebral soft tissues normal thickness. Disc space narrowing with endplate spur formation at C5-C6 and C6- C7. Uncovertebral spurs encroach upon bilateral cervical neural foramina at C6-C7 as well as at right C5-C6. Vertebral body heights maintained without fracture or subluxation. AP narrowing of spinal canal at C5-C6 and C6-C7. Visualized lung apices clear.  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 No definite acute cervical spine abnormalities identified.    Original Report Authenticated By: Ulyses Southward,  M.D.    Dg Knee Complete 4 Views Left  12/04/2012  *RADIOLOGY REPORT*  Clinical Data: Fall.  Pain and bruising.  LEFT KNEE - COMPLETE 4+ VIEW  Comparison: None.  Findings: Mild prepatellar and infrapatellar soft tissue swelling is noted.  No evidence of fracture, dislocation, or knee joint effusion.  Mild degenerative spurring of the patella is noted.  No other significant bone abnormality or joint space narrowing is seen.  IMPRESSION:  1.  Mild prepatellar and infrapatellar soft tissue swelling.  No evidence of fracture or joint effusion. 2.  Mild degenerative spurring of the patella.   Original Report Authenticated By: Myles Rosenthal, M.D.    Dg Knee Complete 4 Views Right  12/04/2012  *RADIOLOGY REPORT*  Clinical Data: Fall. Right knee pain and bruising.  RIGHT KNEE - COMPLETE 4+ VIEW  Comparison: None.  Findings: No evidence of fracture or dislocation.  No evidence of knee joint effusion.  Mild tricompartmental degenerative spurring is seen without significant joint space narrowing.  Mild chondrocalcinosis also noted.  No other bone abnormality identified.  IMPRESSION:  1.  No acute findings. 2.  Mild tricompartmental osteoarthritis and chondrocalcinosis.   Original Report Authenticated By: Myles Rosenthal, M.D.    Ct Maxillofacial Wo Cm  12/04/2012  *RADIOLOGY REPORT*  Clinical Data:  Larey Seat on outside steps striking right side of head on concrete, hematoma lateral to right eye, visual changes, headache, loss of consciousness, somnolent, posterior neck pain, history diabetes, hypertension, coronary artery disease  CT HEAD WITHOUT CONTRAST CT MAXILLOFACIAL WITHOUT CONTRAST CT CERVICAL SPINE WITHOUT CONTRAST  Technique:  Multidetector CT imaging of the head, cervical spine, and maxillofacial structures were performed using the standard protocol without intravenous contrast. Multiplanar CT image reconstructions of the cervical spine and maxillofacial structures were  also generated.  Comparison:  CT head 08/07/2012, CT cervical spine 01/21/2010  CT HEAD  Findings: Generalized atrophy. Normal ventricular morphology. No midline shift or mass effect. Minimal small vessel chronic ischemic changes of deep cerebral white matter. Few streak artifacts at skull base. No definite intracranial hemorrhage, mass lesion, or evidence of acute infarction. No extra-axial fluid collections. Scalp hematoma identified lateral to the right supraorbital ridge. Calvaria intact. Small right parietal lucency image 59 unchanged.  IMPRESSION: Atrophy with minimal small vessel chronic ischemic changes of deep cerebral white matter. No acute intracranial abnormalities.  CT MAXILLOFACIAL  Findings: Visualized intracranial contents significant only for generalized atrophy. Tiny osteoma of the right ethmoid air cells. Paranasal sinuses, mastoid air cells and middle ear cavities otherwise clear. Intraorbital soft tissue planes clear. Focal hematoma lateral to the superior right orbit. Minimal nasal septal deviation to the right with aeration of bilateral middle turbinates. No facial bone fractures identified.  IMPRESSION:  No acute facial bony abnormalities.  CT CERVICAL SPINE  Findings: Degradation of image quality at inferior cervical spine secondary to beam hardening from the patients shoulders. Visualized skull base intact. Prevertebral soft tissues normal thickness. Disc space narrowing with endplate spur formation at C5-C6 and C6- C7. Uncovertebral spurs encroach upon bilateral cervical neural foramina at C6-C7 as well as at right C5-C6. Vertebral body heights maintained without fracture or subluxation. AP narrowing of spinal canal at C5-C6 and C6-C7. Visualized lung apices clear.  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 No definite acute cervical spine abnormalities identified.   Original Report Authenticated By: Ulyses Southward, M.D.       1. Fall   2. Periorbital  contusion of right eye  3. Contusion of knee, right   4. Contusion of knee, left       MDM  Fall with hematoma in the right periorbital area and bruises of both legs. Recent fall with bruises of the left breast and left arm. She has a history of hepatic encephalopathy so ammonia level be checked as well as electrolytes. CT has been requested of head, face, neck.  X-rays and CT scans are unremarkable. Ammonia level is normal. She is worried about her urine for urinalysis is obtained. It is significant for a decreased but no evidence of acute urinary tract infection. She'll be sent home with fall instructions and contusion instructions and prescriptions given for Norco for pain.   I personally performed the services described in this documentation, which was scribed in my presence. The recorded information has been reviewed and is accurate.     [name] Booze, MD 12/04/12 [phone]

## 2012-12-04 NOTE — ED Notes (Signed)
Pt states she tripped and fell. Pt states dizziness after the fall. Goose egg to right eye with abrasions to eye also. Ice pack applied to area of swelling.

## 2012-12-04 NOTE — ED Notes (Addendum)
Pt tripped and fell approximately 10 minutes pta. Pt c/o right side head and facial pain. Bleeding/hematoma noted to right eyebrow. Pt husband denies loc after fall.

## 2012-12-18 LAB — AFP TUMOR MARKER: AFP-Tumor Marker: 2.9 ng/mL (ref 0.0–8.0)

## 2012-12-18 NOTE — Progress Notes (Signed)
Quick Note:  Pt is aware. Are we still doing afp q 6 months?   Darl Pikes, please nic ov in April ______

## 2012-12-18 NOTE — Progress Notes (Signed)
Quick Note:  AFP normal OV planned for April 2014. Repeat AFP in 6 mos ______

## 2012-12-29 NOTE — Progress Notes (Signed)
Quick Note:  No, actually no longer doing AFPs. I discussed this with Dr. Jena Gauss. Will only do [place] of abdomen for hepatoma surveillance every 6 months. ______

## 2013-01-26 ENCOUNTER — Ambulatory Visit (HOSPITAL_COMMUNITY)
Admission: RE | Admit: 2013-01-26 | Discharge: 2013-01-26 | Disposition: A | Discharge status: Home / Self Care | Payer: Medicare Other | Source: Ambulatory Visit | Attending: Family Medicine | Admitting: Family Medicine

## 2013-01-26 DIAGNOSIS — R29898 Other symptoms and signs involving the musculoskeletal system: Secondary | ICD-10-CM | POA: Insufficient documentation

## 2013-01-26 DIAGNOSIS — R262 Difficulty in walking, not elsewhere classified: Secondary | ICD-10-CM | POA: Insufficient documentation

## 2013-01-26 DIAGNOSIS — R296 Repeated falls: Secondary | ICD-10-CM | POA: Insufficient documentation

## 2013-01-26 DIAGNOSIS — M6281 Muscle weakness (generalized): Secondary | ICD-10-CM | POA: Insufficient documentation

## 2013-01-26 DIAGNOSIS — IMO0001 Reserved for inherently not codable concepts without codable children: Secondary | ICD-10-CM | POA: Insufficient documentation

## 2013-01-26 DIAGNOSIS — I10 Essential (primary) hypertension: Secondary | ICD-10-CM | POA: Insufficient documentation

## 2013-01-26 DIAGNOSIS — E119 Type 2 diabetes mellitus without complications: Secondary | ICD-10-CM | POA: Insufficient documentation

## 2013-01-26 NOTE — Evaluation (Signed)
Physical Therapy Evaluation  Patient Details  Name: [name] [name] MRN: [id number] Date of Birth: 07/04/1941 Charge:  Evaluation Today's Date: 01/26/2013 Time: 1520-1608 PT Time Calculation (min): 48 min              Visit#: 1 of 12  Re-eval: 02/25/13 Assessment Diagnosis: Gait instability Prior Therapy: 2013  Authorization: [employer]       Authorization Visit#: 1 of 10   Past Medical History:  Past Medical History  Diagnosis Date  . CAD in native artery   . Thrombocytopenia, unspecified   . Unspecified hereditary and idiopathic peripheral neuropathy   . Unspecified fall   . Other and unspecified hyperlipidemia   . Unspecified essential hypertension   . Obesity, unspecified   . Shortness of breath   . Personal history of neurosis   . Intrinsic asthma, unspecified   . Allergic rhinitis, cause unspecified   . Cirrhosis of liver     NASH, afp on 06/17/12 =3.7, per pt she had hep B vaccines in 1996  . Colon adenomas 03/08/10    tcs by Dr. Jena Gauss  . Hyperplastic polyps of stomach 03/08/10    tcs by Dr. Geni Bers  . Chronic gastritis 03/09/11    egd by Dr. Jena Gauss  . History of hemorrhoids 03/08/10    tcs- internal and external  . Diverticula of colon 03/08/10    L side  . Hiatal hernia 03/08/10  . Esophagitis, erosive 03/08/10  . GERD (gastroesophageal reflux disease)   . Wears glasses   . Type II or unspecified type diabetes mellitus without mention of complication, not stated as uncontrolled    Past Surgical History:  Past Surgical History  Procedure Laterality Date  . Hysterectomy and btl      s/p  . Abdominal hysterectomy    . Tumor excision  2003    rt arm and left foot  . Colonoscopy  03/08/10    Dr. [name]-->ext/int hemorrhoids, anal paipilla, rectal polyps, desc polyps, cecal polyp, left-sided diverticula. suboptiomal prep. next TCS 02/2015. multiple adenomas  . Esophagogastroduodenoscopy  03/08/10    Dr. Dellie Catholic erosive RE, small hh, antral erosions,  small 1cm are of mucosal indentation along gastric body of doubtful significance, cystic nodularity of hypophyarynx, base of tongue, base of epiglottis. benign gastric biopsies  . Tubal ligation    . Esophagogastroduodenoscopy  10/08/2011    Dr. Dionisio David esophageal varices, antral erosion. next egd 09/2013  . Foot surgery  2003    lt foot  . Breast surgery    . Eye surgery      cataracts    Subjective Symptoms/Limitations Symptoms: [name] [name] was seen for therapy from 09/02/2012 thru 10/29/2013 for a total of 12 visits.  The patient states that since December it just seems like she is getting weaker and weaker.  She states that she has had several falls the latest being about a month ago.  How long can you sit comfortably?: sitting is no problem How long can you stand comfortably?: three minutes. How long can you walk comfortably?: Pt is walking with her walker.  She is unable to walk more than three minutes. Pain Assessment Currently in Pain?: No/denies  Precautions/Restrictions  Precautions Precautions: Fall  Balance Screening Balance Screen Has the patient fallen in the past 6 months: Yes How many times?:  (6) Has the patient had a decrease in activity level because of a fear of falling? : Yes Is the patient reluctant to leave their home because of  a fear of falling? : Yes   Cognition/Observation Observation/Other Assessments Observations: + nystagmus; pt states she is dizzy all the time.  Sensation/Coordination/Flexibility/Functional Tests Functional Tests Functional Tests: 30 sec. to stand  4/30 seconds Functional Tests: ABC 15 %; 240  Assessment RLE Strength Right Hip Flexion: 5/5 Right Hip Extension: 3/5 Right Hip ABduction: 3/5 Right Hip ADduction: 3+/5 Right Knee Flexion: 4/5 Right Knee Extension: 4/5 Right Ankle Dorsiflexion:  (4-/5) LLE Strength Left Hip Flexion:  (4-/5) Left Hip Extension: 4/5 Left Hip ABduction: 3/5 Left Hip ADduction: 3/5 Left Knee  Flexion: 3+/5 Left Knee Extension: 3+/5 Left Ankle Dorsiflexion: 3+/5  Exercise/Treatments Mobility/Balance  Berg Balance Test Sit to Stand: Able to stand without using hands and stabilize independently Standing Unsupported: Able to stand 2 minutes with supervision Sitting with Back Unsupported but Feet Supported on Floor or Stool: Able to sit safely and securely 2 minutes Stand to Sit: Uses backs of legs against chair to control descent Transfers: Able to transfer safely, definite need of hands Standing Unsupported with Eyes Closed: Able to stand 10 seconds with supervision Standing Ubsupported with Feet Together: Able to place feet together independently and stand for 1 minute with supervision From Standing, Reach Forward with Outstretched Arm: Can reach confidently >25 cm (10") From Standing Position, Pick up Object from Floor: Able to pick up shoe safely and easily From Standing Position, Turn to Look Behind Over each Shoulder: Looks behind one side only/other side shows less weight shift Turn 360 Degrees: Able to turn 360 degrees safely but slowly Standing Unsupported, Alternately Place Feet on Step/Stool: Able to stand independently and complete 8 steps >20 seconds Standing Unsupported, One Foot in Front: Able to take small step independently and hold 30 seconds Standing on One Leg: Tries to lift leg/unable to hold 3 seconds but remains standing independently Total Score: 41     Standing SLS:  (3 x 1 second max)   Supine Bridges: 5 reps Straight Leg Raises: Both;5 reps Sidelying Hip ABduction: Both;10 reps     Physical Therapy Assessment and Plan PT Assessment and Plan Clinical Impression Statement: Pt with decreased balance and decreased strength who will benefit from skilled PT to increase functional ability and reduce the risk of falling Pt will benefit from skilled therapeutic intervention in order to improve on the following deficits: Abnormal gait;Decreased activity  tolerance;Decreased balance;Decreased strength;Difficulty walking Rehab Potential: Good PT Frequency: Min 3X/week PT Duration: 4 weeks PT Treatment/Interventions: Gait training;Therapeutic activities;Therapeutic exercise;Balance training PT Plan: begin nustep; SLS, heel raise, minisquat, marching in place, heel toe gt, retro gt, sidestepping; lateral and forward step up.  Progress to rockerboard, wall squats, foam rotation , 1-5, tandem walking on foam..    Goals Home Exercise Program Pt will Perform Home Exercise Program: Independently PT Short Term Goals PT Short Term Goal 1: improved strength by 1/2 grade to allow sit to stand easier  PT Short Term Goal 2: improve Berg by 5 level to allow pt to feel more confident walking with her walker PT Short Term Goal 3: Pt to be able to stand for 5 minutes to bush teeth PT Long Term Goals Time to Complete Long Term Goals: 4 weeks PT Long Term Goal 1: improve strength by 1 grade to allow pt to come up from squatted position PT Long Term Goal 2: improve berg by 10 to have no falls in the past two weeks Long Term Goal 3: Pt to be walking with walker for 15 minutes.  Long Term Goal  4: Pt to be able to stand at kitchen counter for 10 minutes to be able to meal prep  Problem List Patient Active Problem List  Diagnosis  . DIABETES, TYPE 2  . HYPERLIPIDEMIA  . OBESITY  . THROMBOCYTOPENIA, CHRONIC  . PERIPHERAL NEUROPATHY  . HYPERTENSION  . CAD, NATIVE VESSEL  . ALLERGIC RHINITIS  . Intrinsic asthma, unspecified  . GERD  . CIRRHOSIS  . Other chronic nonalcoholic liver disease  . DYSPNEA  . ANXIETY DISORDER, HX OF  . COLONIC POLYPS, HX OF  . Hyperlipidemia  . Essential hypertension  . Uncontrolled type 2 diabetes mellitus with complication  . Posterior tibial tendon dysfunction  . Encephalopathy chronic  . Renal colic  . Anemia  . 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 Preoperative clearance  . Frequent falls  . Ataxia  .  Peripheral neuropathy  . Personal history of fall  . Muscle weakness (generalized)  . Dizziness  . Multiple falls  . Difficulty walking  . Bilateral leg weakness    PT - End of Session Equipment Utilized During Treatment: Gait belt Activity Tolerance: Patient tolerated treatment well General Behavior During Session: [facility] for tasks performed Cognition: [facility] for tasks performed PT Plan of Care PT Home Exercise Plan: given for strengthening  GP Functional Assessment Tool Used: ABC Functional Limitation: Mobility: Walking and moving around Mobility: Walking and Moving Around Current Status ([id number]): At least 80 percent but less than 100 percent impaired, limited or restricted Mobility: Walking and Moving Around Goal Status [id number]): At least 20 percent but less than 40 percent impaired, limited or restricted  Maleea Camilo,CINDY 01/26/2013, 4:33 PM  Physician Documentation Your signature is required to indicate approval of the treatment plan as stated above.  Please sign and either send electronically or make a copy of this report for your files and return this physician signed original.   Please mark one 1.__approve of plan  2. ___approve of plan with the following conditions.   ______________________________                                                          _____________________ Physician Signature                                                                                                             Date

## 2013-01-28 ENCOUNTER — Ambulatory Visit (HOSPITAL_COMMUNITY)
Admission: RE | Admit: 2013-01-28 | Discharge: 2013-01-28 | Disposition: A | Discharge status: Home / Self Care | Payer: Medicare Other | Source: Ambulatory Visit | Attending: Family Medicine | Admitting: Family Medicine

## 2013-01-28 NOTE — Progress Notes (Signed)
Physical Therapy Treatment Patient Details  Name: [name] [name] MRN: [id number] Date of Birth: 12-29-1940  Today's Date: 01/28/2013 Time: 4098-1191 PT Time Calculation (min): 39 min  Visit#: 2 of 12  Re-eval: 02/25/13 Charges: Therex x 35'  Authorization: [employer]are  Authorization Visit#: 2 of 10   Subjective: Symptoms/Limitations Symptoms: Pt reports HEP compliance. Pain Assessment Currently in Pain?: No/denies   Exercise/Treatments Aerobic Stationary Bike: NuStep 8'@1 .0 Standing Heel Raises: 10 reps;Limitations Heel Raises Limitations: toe raises x 10 Functional Squat: 10 reps SLS: R:5" L:1" max of 3 Supine Bridges: 10 reps Straight Leg Raises: 10 reps;Both Sidelying Hip ABduction: 10 reps  Physical Therapy Assessment and Plan PT Assessment and Plan Clinical Impression Statement: Progressed standing exercises with need of one standing rest break. Pt requires multi modal cueing with mat exercises in order to stabilize properly. Pt reports no increase in pain throughout session.  PT Plan: Progress to step ups, retro gait and marching in place.     Problem List Patient Active Problem List  Diagnosis  . DIABETES, TYPE 2  . HYPERLIPIDEMIA  . OBESITY  . THROMBOCYTOPENIA, CHRONIC  . PERIPHERAL NEUROPATHY  . HYPERTENSION  . CAD, NATIVE VESSEL  . ALLERGIC RHINITIS  . Intrinsic asthma, unspecified  . GERD  . CIRRHOSIS  . Other chronic nonalcoholic liver disease  . DYSPNEA  . ANXIETY DISORDER, HX OF  . COLONIC POLYPS, HX OF  . Hyperlipidemia  . Essential hypertension  . Uncontrolled type 2 diabetes mellitus with complication  . Posterior tibial tendon dysfunction  . Encephalopathy chronic  . Renal colic  . Anemia  . 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 Preoperative clearance  . Frequent falls  . Ataxia  . Peripheral neuropathy  . Personal history of fall  . Muscle weakness (generalized)  . Dizziness  . Multiple falls  .  Difficulty walking  . Bilateral leg weakness    PT - End of Session Equipment Utilized During Treatment: Gait belt Activity Tolerance: Patient tolerated treatment well General Behavior During Session: [facility] for tasks performed Cognition: [facility] for tasks performed PT Plan of Care PT Home Exercise Plan: given for strengthening  GP Functional Assessment Tool Used: ABC  Seth Bake, PTA  01/28/2013, 4:38 PM

## 2013-01-30 ENCOUNTER — Ambulatory Visit (HOSPITAL_COMMUNITY): Admission: RE | Admit: 2013-01-30 | Payer: Medicare Other | Source: Ambulatory Visit

## 2013-02-02 ENCOUNTER — Ambulatory Visit (HOSPITAL_COMMUNITY)
Admission: RE | Admit: 2013-02-02 | Discharge: 2013-02-02 | Disposition: A | Discharge status: Home / Self Care | Payer: Medicare Other | Source: Ambulatory Visit | Attending: Family Medicine | Admitting: Family Medicine

## 2013-02-02 NOTE — Progress Notes (Signed)
Physical Therapy Treatment Patient Details  Name: [name] [name] MRN: [id number] Date of Birth: [birth year]  Today's Date: 02/02/2013 Time: 4098-1191 PT Time Calculation (min): 40 min  Visit#: 3 of 12  Re-eval: 02/25/13 Charges: Therex x 38'  Authorization: united [employer] medicare  Authorization Visit#: 3 of 10   Subjective: Symptoms/Limitations Symptoms: Pt states that she lost her balance the other morning and almost fell but caught herself on the kitchen table. Pain Assessment Currently in Pain?: No/denies   Exercise/Treatments Aerobic Stationary Bike: NuStep 10'@1 .0 Standing Heel Raises: 10 reps;Limitations Heel Raises Limitations: toe raises x 10 Functional Squat: 10 reps Rocker Board: 2 minutes SLS: R:4" L:2" max of 3 Other Standing Knee Exercises: Retro gait x 1RT Other Standing Knee Exercises: Tall marching in place x 10 w/o UE assist and min/mod assist from therapist  Physical Therapy Assessment and Plan PT Assessment and Plan Clinical Impression Statement: Pt continues to require frequent rest breaks throughout session. Pt continues to be limited by her fear of falling. Tx focus on improving balance and activity tolerance. Pt is completing mat activities at home. Pt reports 0/10 pain at end of session. PT Plan: Progress to step ups next session.     Problem List Patient Active Problem List  Diagnosis  . DIABETES, TYPE 2  . HYPERLIPIDEMIA  . OBESITY  . THROMBOCYTOPENIA, CHRONIC  . PERIPHERAL NEUROPATHY  . HYPERTENSION  . CAD, NATIVE VESSEL  . ALLERGIC RHINITIS  . Intrinsic asthma, unspecified  . GERD  . CIRRHOSIS  . Other chronic nonalcoholic liver disease  . DYSPNEA  . ANXIETY DISORDER, HX OF  . COLONIC POLYPS, HX OF  . Hyperlipidemia  . Essential hypertension  . Uncontrolled type 2 diabetes mellitus with complication  . Posterior tibial tendon dysfunction  . Encephalopathy chronic  . Renal colic  . Anemia  . Atypical ductal hyperplasia of  breast - right   . Preoperative clearance  . Frequent falls  . Ataxia  . Peripheral neuropathy  . Personal history of fall  . Muscle weakness (generalized)  . Dizziness  . Multiple falls  . Difficulty walking  . Bilateral leg weakness    PT - End of Session Equipment Utilized During Treatment: Gait belt Activity Tolerance: Patient tolerated treatment well General Behavior During Session: [facility] for tasks performed Cognition: [facility] for tasks performed  GP Functional Assessment Tool Used: ABC  Seth Bake, PTA 02/02/2013, 4:37 PM

## 2013-02-04 ENCOUNTER — Ambulatory Visit (HOSPITAL_COMMUNITY)
Admission: RE | Admit: 2013-02-04 | Discharge: 2013-02-04 | Disposition: A | Discharge status: Home / Self Care | Payer: Medicare Other | Source: Ambulatory Visit | Attending: Internal Medicine | Admitting: Internal Medicine

## 2013-02-04 DIAGNOSIS — R29898 Other symptoms and signs involving the musculoskeletal system: Secondary | ICD-10-CM

## 2013-02-04 DIAGNOSIS — R296 Repeated falls: Secondary | ICD-10-CM

## 2013-02-04 DIAGNOSIS — R262 Difficulty in walking, not elsewhere classified: Secondary | ICD-10-CM

## 2013-02-04 NOTE — Progress Notes (Signed)
out Physical Therapy Treatment Patient Details  Name: [name] [name] MRN: [id number] Date of Birth: October 28, 1941 Charge there activity x 42 Today's Date: 02/04/2013 Time:1308  - 1349    Visit#: 4 of 12  Re-eval: 02/25/13    Authorization: [employer] medicare     Authorization Visit#: 4 of 10   Subjective: Symptoms/Limitations Symptoms: Pt states she is doing her exercises at home Pain Assessment Currently in Pain?: No/denies  Exercise/Treatments  Aerobic Stationary Bike: NuStep 10'@1 .0   Standing Heel Raises: 10 reps;Limitations Heel Raises Limitations: toe raises x 10 Lateral Step Up: 10 reps Forward Step Up: Both;10 reps Functional Squat: 10 reps Rocker Board: 2 minutes SLS: R:6" L:4" max of 3 Other Standing Knee Exercises: side step, tandem and retro gait  Other Standing Knee Exercises: Tall marching in place x 10 w/o UE assist and min/mod assist from therapist  Physical Therapy Assessment and Plan PT Assessment and Plan Clinical Impression Statement: Pt needed less rest breaks but frequently lost her balance today.  Added side step/ tandem gt to improve balance; lateral and forward step ups to improve LE strength PT Frequency: Min 3X/week PT Duration: 4 weeks PT Treatment/Interventions: Gait training;Therapeutic activities;Therapeutic exercise;Balance training PT Plan: begin tandem stance and proprioceptive toe tap to different areas next visit.    Goals Home Exercise Program Pt will Perform Home Exercise Program: Independently PT Short Term Goals PT Short Term Goal 1: improved strength by 1/2 grade to allow sit to stand easier  PT Short Term Goal 1 - Progress: Progressing toward goal PT Short Term Goal 2: improve Berg by 5 level to allow pt to feel more confident walking with her walker PT Short Term Goal 2 - Progress: Progressing toward goal PT Short Term Goal 3: Pt to be able to stand for 5 minutes to bush teeth PT Short Term Goal 3 - Progress:  Progressing toward goal PT Long Term Goals PT Long Term Goal 1: improve strength by 1 grade to allow pt to come up from squatted position PT Long Term Goal 1 - Progress: Progressing toward goal PT Long Term Goal 2: improve berg by 10 to have no falls in the past two weeks PT Long Term Goal 2 - Progress: Progressing toward goal Long Term Goal 3: Pt to be walking with walker for 15 minutes.  Long Term Goal 3 Progress: Progressing toward goal Long Term Goal 4: Pt to be able to stand at kitchen counter for 10 minutes to be able to meal prep  Problem List Patient Active Problem List  Diagnosis  . DIABETES, TYPE 2  . HYPERLIPIDEMIA  . OBESITY  . THROMBOCYTOPENIA, CHRONIC  . PERIPHERAL NEUROPATHY  . HYPERTENSION  . CAD, NATIVE VESSEL  . ALLERGIC RHINITIS  . Intrinsic asthma, unspecified  . GERD  . CIRRHOSIS  . Other chronic nonalcoholic liver disease  . DYSPNEA  . ANXIETY DISORDER, HX OF  . COLONIC POLYPS, HX OF  . Hyperlipidemia  . Essential hypertension  . Uncontrolled type 2 diabetes mellitus with complication  . Posterior tibial tendon dysfunction  . Encephalopathy chronic  . Renal colic  . Anemia  . 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 Preoperative clearance  . Frequent falls  . Ataxia  . Peripheral neuropathy  . Personal history of fall  . Muscle weakness (generalized)  . Dizziness  . Multiple falls  . Difficulty walking  . Bilateral leg weakness    PT - End of Session Equipment Utilized During Treatment:  Gait belt Activity Tolerance: Patient tolerated treatment well General Behavior During Session: [facility] for tasks performed Cognition: [facility] for tasks performed  GP Functional Assessment Tool Used: ABC Functional Limitation: Mobility: Walking and moving around Mobility: Walking and Moving Around Current Status (Y7829): At least 80 percent but less than 100 percent impaired, limited or restricted Mobility: Walking and Moving Around Goal Status [phone]):  At least 20 percent but less than 40 percent impaired, limited or restricted  RUSSELL,CINDY 02/04/2013, 1:51 PM

## 2013-02-06 ENCOUNTER — Ambulatory Visit (HOSPITAL_COMMUNITY): Payer: Medicare Other | Admitting: Physical Therapy

## 2013-02-09 ENCOUNTER — Ambulatory Visit (HOSPITAL_COMMUNITY)
Admission: RE | Admit: 2013-02-09 | Discharge: 2013-02-09 | Disposition: A | Discharge status: Home / Self Care | Payer: Medicare Other | Source: Ambulatory Visit | Attending: Family Medicine | Admitting: Family Medicine

## 2013-02-09 NOTE — Progress Notes (Signed)
Physical Therapy Treatment Patient Details  Name: [name] [name] MRN: [id number] Date of Birth: June 22, 1941  Today's Date: 02/09/2013 Time: 1300-1352 PT Time Calculation (min): 52 min Charge: There activity x 42 Visit#: 5 of 12  Re-eval: 02/25/13   Authorization: [employer] medicare  Authorization Time Period:    Authorization Visit#: 5 of 10  Subjective: Symptoms/Limitations Symptoms: Pt is doing well; walking 2-3 times a day now.  Exercise/Treatments Aerobic Stationary Bike: NuStep 10'@2 .0 Standing Heel Raises: 10 reps;Limitations Heel Raises Limitations: toe raises x 10 Lateral Step Up: 15 reps Forward Step Up: Both;15 reps Functional Squat: 10 reps Rocker Board: 2 minutes SLS: R:6" L:4" max of 3 Other Standing Knee Exercises: side step, tandem and retro gait  Other Standing Knee Exercises: Tall marching in place x 10 w/o UE assist and min/mod assist from therapist     Physical Therapy Assessment and Plan PT Assessment and Plan Clinical Impression Statement: Pt required multiple rest breaks with therapy today. Multiple loss of balance with therapist having to manually correct x 1 the rest of the time pt was able to self recover. PT Plan: begin tandem stance and proprioceptive toe tap to different areas next visit.    Goals Home Exercise Program Pt will Perform Home Exercise Program: Independently PT Goal: Perform Home Exercise Program - Progress: Met PT Short Term Goals PT Short Term Goal 1: improved strength by 1/2 grade to allow sit to stand easier  PT Short Term Goal 1 - Progress: Met PT Short Term Goal 2: improve Berg by 5 level to allow pt to feel more confident walking with her walker PT Short Term Goal 2 - Progress: Met PT Short Term Goal 3: Pt to be able to stand for 5 minutes to bush teeth PT Short Term Goal 3 - Progress: Progressing toward goal PT Long Term Goals PT Long Term Goal 1: improve strength by 1 grade to allow pt to come up from squatted  position PT Long Term Goal 1 - Progress: Progressing toward goal PT Long Term Goal 2: improve berg by 10 to have no falls in the past two weeks PT Long Term Goal 2 - Progress: Progressing toward goal Long Term Goal 3: Pt to be walking with walker for 15 minutes.  Long Term Goal 3 Progress: Met Long Term Goal 4: Pt to be able to stand at kitchen counter for 10 minutes to be able to meal prep Long Term Goal 4 Progress: Progressing toward goal  Problem List Patient Active Problem List  Diagnosis  . DIABETES, TYPE 2  . HYPERLIPIDEMIA  . OBESITY  . THROMBOCYTOPENIA, CHRONIC  . PERIPHERAL NEUROPATHY  . HYPERTENSION  . CAD, NATIVE VESSEL  . ALLERGIC RHINITIS  . Intrinsic asthma, unspecified  . GERD  . CIRRHOSIS  . Other chronic nonalcoholic liver disease  . DYSPNEA  . ANXIETY DISORDER, HX OF  . COLONIC POLYPS, HX OF  . Hyperlipidemia  . Essential hypertension  . Uncontrolled type 2 diabetes mellitus with complication  . Posterior tibial tendon dysfunction  . Encephalopathy chronic  . Renal colic  . Anemia  . 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 Preoperative clearance  . Frequent falls  . Ataxia  . Peripheral neuropathy  . Personal history of fall  . Muscle weakness (generalized)  . Dizziness  . Multiple falls  . Difficulty walking  . Bilateral leg weakness    PT - End of Session Equipment Utilized During Treatment: Gait belt Activity Tolerance: Patient  tolerated treatment well  GP    RUSSELL,CINDY 02/09/2013, 1:58 PM

## 2013-02-11 ENCOUNTER — Emergency Department (HOSPITAL_COMMUNITY): Payer: Medicare Other

## 2013-02-11 ENCOUNTER — Encounter (HOSPITAL_COMMUNITY): Payer: Self-pay | Admitting: *Deleted

## 2013-02-11 ENCOUNTER — Ambulatory Visit (HOSPITAL_COMMUNITY): Payer: Medicare Other | Admitting: *Deleted

## 2013-02-11 ENCOUNTER — Other Ambulatory Visit: Payer: Self-pay | Admitting: Orthopedic Surgery

## 2013-02-11 ENCOUNTER — Emergency Department (EMERGENCY_DEPARTMENT_HOSPITAL)
Admission: EM | Admit: 2013-02-11 | Discharge: 2013-02-11 | Disposition: A | Discharge status: Home / Self Care | Payer: Medicare Other | Source: Home / Self Care | Attending: Emergency Medicine | Admitting: Emergency Medicine

## 2013-02-11 DIAGNOSIS — Z9181 History of falling: Secondary | ICD-10-CM | POA: Insufficient documentation

## 2013-02-11 DIAGNOSIS — E785 Hyperlipidemia, unspecified: Secondary | ICD-10-CM | POA: Insufficient documentation

## 2013-02-11 DIAGNOSIS — Y9389 Activity, other specified: Secondary | ICD-10-CM | POA: Insufficient documentation

## 2013-02-11 DIAGNOSIS — Z8709 Personal history of other diseases of the respiratory system: Secondary | ICD-10-CM | POA: Insufficient documentation

## 2013-02-11 DIAGNOSIS — K219 Gastro-esophageal reflux disease without esophagitis: Secondary | ICD-10-CM | POA: Insufficient documentation

## 2013-02-11 DIAGNOSIS — Y92009 Unspecified place in unspecified non-institutional (private) residence as the place of occurrence of the external cause: Secondary | ICD-10-CM | POA: Insufficient documentation

## 2013-02-11 DIAGNOSIS — Z862 Personal history of diseases of the blood and blood-forming organs and certain disorders involving the immune mechanism: Secondary | ICD-10-CM | POA: Insufficient documentation

## 2013-02-11 DIAGNOSIS — Z860100 Personal history of colon polyps, unspecified: Secondary | ICD-10-CM | POA: Insufficient documentation

## 2013-02-11 DIAGNOSIS — Z8679 Personal history of other diseases of the circulatory system: Secondary | ICD-10-CM | POA: Insufficient documentation

## 2013-02-11 DIAGNOSIS — Z8719 Personal history of other diseases of the digestive system: Secondary | ICD-10-CM | POA: Insufficient documentation

## 2013-02-11 DIAGNOSIS — S0990XA Unspecified injury of head, initial encounter: Secondary | ICD-10-CM | POA: Insufficient documentation

## 2013-02-11 DIAGNOSIS — I10 Essential (primary) hypertension: Secondary | ICD-10-CM | POA: Insufficient documentation

## 2013-02-11 DIAGNOSIS — S4431XA Injury of axillary nerve, right arm, initial encounter: Secondary | ICD-10-CM

## 2013-02-11 DIAGNOSIS — S42301B Unspecified fracture of shaft of humerus, right arm, initial encounter for open fracture: Secondary | ICD-10-CM

## 2013-02-11 DIAGNOSIS — E669 Obesity, unspecified: Secondary | ICD-10-CM | POA: Insufficient documentation

## 2013-02-11 DIAGNOSIS — S4430XA Injury of axillary nerve, unspecified arm, initial encounter: Secondary | ICD-10-CM | POA: Insufficient documentation

## 2013-02-11 DIAGNOSIS — S42209A Unspecified fracture of upper end of unspecified humerus, initial encounter for closed fracture: Secondary | ICD-10-CM | POA: Insufficient documentation

## 2013-02-11 DIAGNOSIS — E119 Type 2 diabetes mellitus without complications: Secondary | ICD-10-CM | POA: Insufficient documentation

## 2013-02-11 DIAGNOSIS — I2510 Atherosclerotic heart disease of native coronary artery without angina pectoris: Secondary | ICD-10-CM | POA: Insufficient documentation

## 2013-02-11 DIAGNOSIS — S42301A Unspecified fracture of shaft of humerus, right arm, initial encounter for closed fracture: Secondary | ICD-10-CM

## 2013-02-11 DIAGNOSIS — W07XXXA Fall from chair, initial encounter: Secondary | ICD-10-CM | POA: Insufficient documentation

## 2013-02-11 DIAGNOSIS — S42201A Unspecified fracture of upper end of right humerus, initial encounter for closed fracture: Secondary | ICD-10-CM

## 2013-02-11 DIAGNOSIS — W19XXXA Unspecified fall, initial encounter: Secondary | ICD-10-CM

## 2013-02-11 DIAGNOSIS — Z8669 Personal history of other diseases of the nervous system and sense organs: Secondary | ICD-10-CM | POA: Insufficient documentation

## 2013-02-11 DIAGNOSIS — S42309A Unspecified fracture of shaft of humerus, unspecified arm, initial encounter for closed fracture: Secondary | ICD-10-CM

## 2013-02-11 HISTORY — DX: Dizziness and giddiness: R42

## 2013-02-11 MED ORDER — HYDROCODONE-ACETAMINOPHEN 10-325 MG PO TABS: 1.0000 | ORAL_TABLET | ORAL | Status: DC | PRN

## 2013-02-11 MED ORDER — MORPHINE SULFATE 4 MG/ML IJ SOLN
4.0000 mg | Freq: Once | INTRAMUSCULAR | Status: AC
  Administered 2013-02-11: 4 mg via INTRAVENOUS
  Filled 2013-02-11: qty 1

## 2013-02-11 MED ORDER — DIAZEPAM 5 MG/ML IJ SOLN
5.0000 mg | Freq: Once | INTRAMUSCULAR | Status: AC
  Administered 2013-02-11: 5 mg via INTRAVENOUS
  Filled 2013-02-11: qty 2

## 2013-02-11 MED ORDER — OXYCODONE HCL 5 MG PO TABS: 5.0000 mg | ORAL_TABLET | ORAL | Status: DC | PRN

## 2013-02-11 NOTE — ED Notes (Signed)
Ortho MD at bedside to splint arm, supplies provided

## 2013-02-11 NOTE — ED Notes (Signed)
Discharge instructions reviewed with pt, questions answered. Pt verbalized understanding.  

## 2013-02-11 NOTE — Consult Note (Signed)
Reason for Consult: Fracture right proximal humeral shaft Referring Physician: Dr. [name] [name] [name] is an 72 y.o. female.  HPI: The patient was at home fell off the potty chair injured her right arm came in with deformity pain swelling axillary nerve related numbness and inability to use her upper extremity. Consults obtained to evaluate axillary nerve and advise appropriate treatment for humeral shaft fracture  As listed below patient has significant and serious medical problems  Past Medical History  Diagnosis Date  . CAD in native artery   . Thrombocytopenia, unspecified   . Unspecified hereditary and idiopathic peripheral neuropathy   . Unspecified fall   . Other and unspecified hyperlipidemia   . Unspecified essential hypertension   . Obesity, unspecified   . Shortness of breath   . Personal history of neurosis   . Intrinsic asthma, unspecified   . Allergic rhinitis, cause unspecified   . Cirrhosis of liver     NASH, afp on 06/17/12 =3.7, per pt she had hep B vaccines in 1996  . Colon adenomas 03/08/10    tcs by Dr. Jena Gauss  . Hyperplastic polyps of stomach 03/08/10    tcs by Dr. Geni Bers  . Chronic gastritis 03/09/11    egd by Dr. Jena Gauss  . History of hemorrhoids 03/08/10    tcs- internal and external  . Diverticula of colon 03/08/10    L side  . Hiatal hernia 03/08/10  . Esophagitis, erosive 03/08/10  . GERD (gastroesophageal reflux disease)   . Wears glasses   . Type II or unspecified type diabetes mellitus without mention of complication, not stated as uncontrolled   . Vertigo     Past Surgical History  Procedure Laterality Date  . Hysterectomy and btl      s/p  . Abdominal hysterectomy    . Tumor excision  2003    rt arm and left foot  . Colonoscopy  03/08/10    Dr. [name]-->ext/int hemorrhoids, anal paipilla, rectal polyps, desc polyps, cecal polyp, left-sided diverticula. suboptiomal prep. next TCS 02/2015. multiple adenomas  . Esophagogastroduodenoscopy  03/08/10     Dr. Dellie Catholic erosive RE, small hh, antral erosions, small 1cm are of mucosal indentation along gastric body of doubtful significance, cystic nodularity of hypophyarynx, base of tongue, base of epiglottis. benign gastric biopsies  . Tubal ligation    . Esophagogastroduodenoscopy  10/08/2011    Dr. Dionisio David esophageal varices, antral erosion. next egd 09/2013  . Foot surgery  2003    lt foot  . Breast surgery    . Eye surgery      cataracts    Family History  Problem Relation Age of Onset  . Coronary artery disease      FH  . Diabetes      FH  . Heart disease Mother   . Colon cancer Neg Hx     Social History:  reports that she has never smoked. She does not have any smokeless tobacco history on file. She reports that she does not drink alcohol or use illicit drugs.  Allergies:  Allergies  Allergen Reactions  . Prochlorperazine Edisylate Other (See Comments)    Causes anxiety/numbness  . Compazine (Prochlorperazine Edisylate) Anxiety    Causes anxiety & nervousness    Medications: I have reviewed the patient's current medications.  No results found for this or any previous visit (from the past 48 hour(s)).  Ct Head Wo Contrast  02/11/2013  *RADIOLOGY REPORT*  Clinical Data:  Unwitnessed fall.  Dizziness  with headache and right shoulder pain.  CT HEAD WITHOUT CONTRAST CT CERVICAL SPINE WITHOUT CONTRAST  Technique:  Multidetector CT imaging of the head and cervical spine was performed following the standard protocol without intravenous contrast.  Multiplanar CT image reconstructions of the cervical spine were also generated.  Comparison:  Prior examinations 12/04/2012.  CT HEAD  Findings: There is no evidence of acute intracranial hemorrhage, mass effect, brain edema or extra-axial fluid collection.  The ventricles and subarachnoid spaces are diffusely prominent consistent with  atrophy.  No acute cortical infarction is seen.  The visualized paranasal sinuses are clear. There is a  stable lucent lesion within the right parietal bone on image 61.  No acute osseous findings are identified.  IMPRESSION: Stable examination.  No acute intracranial or calvarial findings.  CT CERVICAL SPINE  Findings: As before, bone detail is limited inferiorly secondary to body habitus.  The alignment is normal.  There is no evidence of acute fracture or traumatic subluxation.  There is stable spondylosis with posterior osteophytes and uncinate spurring at the C5-C6 and C6-C7 levels.  No acute soft tissue abnormalities are identified.  IMPRESSION: Stable examination demonstrating no evidence of acute fracture, subluxation or static signs of instability.  Similar spondylosis.   Original Report Authenticated By: Carey Bullocks, M.D.    Ct Cervical Spine Wo Contrast  02/11/2013  *RADIOLOGY REPORT*  Clinical Data:  Unwitnessed fall.  Dizziness with headache and right shoulder pain.  CT HEAD WITHOUT CONTRAST CT CERVICAL SPINE WITHOUT CONTRAST  Technique:  Multidetector CT imaging of the head and cervical spine was performed following the standard protocol without intravenous contrast.  Multiplanar CT image reconstructions of the cervical spine were also generated.  Comparison:  Prior examinations 12/04/2012.  CT HEAD  Findings: There is no evidence of acute intracranial hemorrhage, mass effect, brain edema or extra-axial fluid collection.  The ventricles and subarachnoid spaces are diffusely prominent consistent with  atrophy.  No acute cortical infarction is seen.  The visualized paranasal sinuses are clear. There is a stable lucent lesion within the right parietal bone on image 61.  No acute osseous findings are identified.  IMPRESSION: Stable examination.  No acute intracranial or calvarial findings.  CT CERVICAL SPINE  Findings: As before, bone detail is limited inferiorly secondary to body habitus.  The alignment is normal.  There is no evidence of acute fracture or traumatic subluxation.  There is stable  spondylosis with posterior osteophytes and uncinate spurring at the C5-C6 and C6-C7 levels.  No acute soft tissue abnormalities are identified.  IMPRESSION: Stable examination demonstrating no evidence of acute fracture, subluxation or static signs of instability.  Similar spondylosis.   Original Report Authenticated By: Carey Bullocks, M.D.    Dg Humerus Right  02/11/2013  *RADIOLOGY REPORT*  Clinical Data: Fall.  RIGHT HUMERUS - 2+ VIEW  Comparison: None.  Findings: There is an oblique fracture of the proximal shaft of the humerus about 6 cm distal to the surgical neck.  The principle distal fragment is displaced anteriorly and medially greater than 50%. It also is angulated medially about 20 degrees.  There is no additional evidence of acute skeletal trauma.  The shoulder joint and the elbow joint are normally maintained.  IMPRESSION: Displaced fracture of the proximal right humerus.   Original Report Authenticated By: Sander Radon, M.D.     Review of Systems  Constitutional: Negative for fever.  Eyes: Negative for blurred vision.  Respiratory: Positive for shortness of breath.   Cardiovascular: Negative  for chest pain.  Gastrointestinal: Positive for abdominal pain.  Genitourinary: Negative for dysuria.  Musculoskeletal: Positive for myalgias, joint pain and falls.  Skin: Negative for rash.  Neurological: Positive for tingling. Negative for headaches.       Axillary nerve related numbness resolving   Psychiatric/Behavioral: Negative for depression.   Blood pressure 117/73, pulse 86, temperature 98.5 F (36.9 C), temperature source Oral, resp. rate 20, height 5\' 8"  (1.727 m), weight 244 lb (110.678 kg), SpO2 97.00%. Physical Exam BP 117/73  Pulse 86  Temp(Src) 98.5 F (36.9 C) (Oral)  Resp 20  Ht 5\' 8"  (1.727 m)  Wt 244 lb (110.678 kg)  BMI 37.11 kg/m2  SpO2 97%  the patient is severely obese I did not observe ambulation secondary to nature of the injury  The right arm shows  some deformity which is masked by the adipose tissue. She has normal range of motion in her hand including wrist extension. Her wrist and elbow are stable her shoulder joint is not involved the muscle tone is normal skin is intact she has a good pulse normal sensation except along the lateral deltoid. Note over the course of splinting the sensory changes resolved  She has no lymphadenopathy we cannot test her right upper extremity deep tendon reflexes. Her balance has been poor although we cannot test that she has had frequent falls  Her left upper extremity shows no deformity no contracture no subluxation normal muscle tone normal skin lower extremities are without deformity. No contractures are noted. Muscle tone normal. Skin normal. Contractures not noted. Normal perfusion and sensation  she is oriented x3 her mood is pleasant Assessment/Plan:  Up initial evaluation of the fracture consideration for surgical intervention was entertained however the patient cirrhosis, obesity, shortness of breath on minimal exertion and coronary artery disease make this somewhat questionable.  Splinting will take care of things in the short-term. She may not be a candidate for a fracture cuff because of the shape of her arm  However, I would like Dr. Jaymes Graff to evaluate the patient in our make arrangements for such evaluation. Surgery not an intervention that is deemed possible with her in splinting and bracing with a fracture cuff would be advisable. If she needs surgery it would have to be done at [facility].   Fuller Canada 02/11/2013, 5:06 PM

## 2013-02-11 NOTE — ED Provider Notes (Signed)
History    This chart was scribed for Ward Givens, MD by Charolett Bumpers, ED Scribe. The patient was seen in room APA12/APA12. Patient's care was started at 11:50.   CSN: [id number]  Arrival date & time 02/11/13  1144   First MD Initiated Contact with Patient 02/11/13 1150      Chief Complaint  Patient presents with  . Fall  . Shoulder Pain    The history is provided by the patient. No language interpreter was used.   [name] [name] is a 72 y.o. female who presents to the Emergency Department via EMS complaining of un-witnessed fall that occurred PTA. She states that she got up to use her potty chair when she fell, hitting her head on the door and landed with her right shoulder into the door frame. She reports associated numbness in her right upper arm. She states that she is unsteady on her feet and ambulates with a walker at baseline. She states that she felt unsteady and was dizzy prior to her fall today. She now complains of a headache and right shoulder pain. She takes Antivert for her dizziness but did not take any today. A sling was applied PTA and EMS administered 2 mg Dilaudid en route IV.   PCP: Dr. Janace LittenEulah Pont    Past Medical History  Diagnosis Date  . CAD in native artery   . Thrombocytopenia, unspecified   . Unspecified hereditary and idiopathic peripheral neuropathy   . Unspecified fall   . Other and unspecified hyperlipidemia   . Unspecified essential hypertension   . Obesity, unspecified   . Shortness of breath   . Personal history of neurosis   . Intrinsic asthma, unspecified   . Allergic rhinitis, cause unspecified   . Cirrhosis of liver     NASH, afp on 06/17/12 =3.7, per pt she had hep B vaccines in 1996  . Colon adenomas 03/08/10    tcs by Dr. Jena Gauss  . Hyperplastic polyps of stomach 03/08/10    tcs by Dr. Geni Bers  . Chronic gastritis 03/09/11    egd by Dr. Jena Gauss  . History of hemorrhoids 03/08/10    tcs- internal and external  . Diverticula  of colon 03/08/10    L side  . Hiatal hernia 03/08/10  . Esophagitis, erosive 03/08/10  . GERD (gastroesophageal reflux disease)   . Wears glasses   . Type II or unspecified type diabetes mellitus without mention of complication, not stated as uncontrolled   . Vertigo     Past Surgical History  Procedure Laterality Date  . Hysterectomy and btl      s/p  . Abdominal hysterectomy    . Tumor excision  2003    rt arm and left foot  . Colonoscopy  03/08/10    Dr. [name]-->ext/int hemorrhoids, anal paipilla, rectal polyps, desc polyps, cecal polyp, left-sided diverticula. suboptiomal prep. next TCS 02/2015. multiple adenomas  . Esophagogastroduodenoscopy  03/08/10    Dr. Dellie Catholic erosive RE, small hh, antral erosions, small 1cm are of mucosal indentation along gastric body of doubtful significance, cystic nodularity of hypophyarynx, base of tongue, base of epiglottis. benign gastric biopsies  . Tubal ligation    . Esophagogastroduodenoscopy  10/08/2011    Dr. Dionisio David esophageal varices, antral erosion. next egd 09/2013  . Foot surgery  2003    lt foot  . Breast surgery    . Eye surgery      cataracts    Family History  Problem Relation Age of Onset  . Coronary artery disease      FH  . Diabetes      FH  . Heart disease Mother   . Colon cancer Neg Hx     History  Substance Use Topics  . Smoking status: Never Smoker   . Smokeless tobacco: Not on file  . Alcohol Use: No  She lives at home with husband Ambulates with walker at baseline She denies smoking  OB History   Grav Para Term Preterm Abortions TAB SAB Ect Mult Living                  Review of Systems  Musculoskeletal: Positive for arthralgias.  Neurological: Positive for headaches.  All other systems reviewed and are negative.    Allergies  Prochlorperazine edisylate and Compazine  Home Medications   Current Outpatient Rx  Name  Route  Sig  Dispense  Refill  . ALPRAZolam (XANAX) 0.5 MG tablet    Oral   Take 1 tablet (0.5 mg total) by mouth 3 (three) times daily as needed for sleep. For anxiety         . aspirin (BAYER LOW STRENGTH) 81 MG EC tablet   Oral   Take 81 mg by mouth daily.           . budesonide-formoterol (SYMBICORT) 160-4.5 MCG/ACT inhaler   Inhalation   Inhale 2 puffs into the lungs 2 (two) times daily.           . clotrimazole (LOTRIMIN) 1 % cream   Topical   Apply topically 2 (two) times daily.   30 g   0   . enalapril (VASOTEC) 5 MG tablet   Oral   Take 5 mg by mouth daily.           Marland Kitchen esomeprazole (NEXIUM) 40 MG capsule   Oral   Take 40 mg by mouth daily before breakfast.           . HYDROcodone-acetaminophen (NORCO/VICODIN) 5-325 MG per tablet   Oral   Take 1 tablet by mouth every 4 (four) hours as needed for pain.   15 tablet   0   . insulin aspart (NOVOLOG) 100 UNIT/ML injection   Subcutaneous   Inject 10-40 Units into the skin daily as needed. Per sliding scale. Patient injects 10-40 units for levels above 150 to 300          . insulin detemir (LEVEMIR) 100 UNIT/ML injection   Subcutaneous   Inject 60 Units into the skin at bedtime.          Marland Kitchen lactulose (CHRONULAC) 10 GM/15ML solution   Oral   Take 60 mLs by mouth daily.          Marland Kitchen linagliptin (TRADJENTA) 5 MG TABS tablet   Oral   Take 5 mg by mouth daily.         . meclizine (ANTIVERT) 25 MG tablet   Oral   Take 25 mg by mouth 3 (three) times daily as needed. For dizziness/nausea         . Multiple Vitamins-Minerals (CENTRUM SILVER PO)   Oral   Take 1 tablet by mouth daily.          . naproxen sodium (ALEVE) 220 MG tablet   Oral   Take 440 mg by mouth daily as needed. For pain         . pregabalin (LYRICA) 100 MG capsule   Oral   Take  100 mg by mouth 2 (two) times daily.            Triage Vitals: BP 153/33  Pulse 86  Temp(Src) 98.5 F (36.9 C) (Oral)  Resp 18  Ht 5\' 8"  (1.727 m)  Wt 244 lb (110.678 kg)  BMI 37.11 kg/m2  SpO2 95%  Vital  signs normal    Physical Exam  Nursing note and vitals reviewed. Constitutional: She is oriented to person, place, and time. She appears well-developed and well-nourished.  HENT:  Head: Normocephalic and atraumatic.  Right Ear: External ear normal.  Left Ear: External ear normal.  Nose: Nose normal.  Mouth/Throat: Oropharynx is clear and moist. No oropharyngeal exudate.  Head non-tender  Eyes: Conjunctivae and EOM are normal. Pupils are equal, round, and reactive to light.  Neck: Normal range of motion. Neck supple. No tracheal deviation present.  Cardiovascular: Normal rate, regular rhythm and normal heart sounds.  Exam reveals no gallop and no friction rub.   No murmur heard. Pulmonary/Chest: Effort normal and breath sounds normal. No respiratory distress. She has no wheezes. She has no rales. She exhibits no tenderness.  Abdominal: Soft. Bowel sounds are normal. She exhibits no distension. There is no tenderness. There is no rebound and no guarding.  Musculoskeletal: Normal range of motion. She exhibits tenderness.  Tenderness to the right lateral clavical shoulder area. Able to move fingers, intact distal pulses. Ribcage and pelvis non-tender.   Neurological: She is alert and oriented to person, place, and time.  Skin: Skin is warm and dry.  Small epidermal abrasion on anterior right knee. No joint effusion noted.   Psychiatric: She has a normal mood and affect. Her behavior is normal.    ED Course  Procedures (including critical care time)  Medications  morphine 4 MG/ML injection 4 mg (4 mg Intravenous Given 02/11/13 1244)  diazepam (VALIUM) injection 5 mg (5 mg Intravenous Given 02/11/13 1243)    DIAGNOSTIC STUDIES: Oxygen Saturation is 95% on room air, adequate by my interpretation.    COORDINATION OF CARE:  11:58 AM-Discussed planned course of treatment with the patient including pain medication and imaging, who is agreeable at this time.   Talked to pt and husband, she  continues to have numbness in the area of the axillary nerve, would like to talk to orthopedist, they would like to stay here  15:58 have been waiting about 2 hrs to talk to ortho about patient.  Secretary has recalled. Pt turned over to Dr Manus Gunning at change of shift.   Ct Head Wo Contrast  Ct Cervical Spine Wo Contrast  02/11/2013  *RADIOLOGY REPORT*  Clinical Data:  Unwitnessed fall.  Dizziness with headache and right shoulder pain.  CT HEAD WITHOUT CONTRAST CT CERVICAL SPINE WITHOUT CONTRAST  Technique:  Multidetector CT imaging of the head and cervical spine was performed following the standard protocol without intravenous contrast.  Multiplanar CT image reconstructions of the cervical spine were also generated.  Comparison:  Prior examinations 12/04/2012.  CT HEAD  Findings: There is no evidence of acute intracranial hemorrhage, mass effect, brain edema or extra-axial fluid collection.  The ventricles and subarachnoid spaces are diffusely prominent consistent with  atrophy.  No acute cortical infarction is seen.  The visualized paranasal sinuses are clear. There is a stable lucent lesion within the right parietal bone on image 61.  No acute osseous findings are identified.  IMPRESSION: Stable examination.  No acute intracranial or calvarial findings.  CT CERVICAL SPINE  Findings: As before,  bone detail is limited inferiorly secondary to body habitus.  The alignment is normal.  There is no evidence of acute fracture or traumatic subluxation.  There is stable spondylosis with posterior osteophytes and uncinate spurring at the C5-C6 and C6-C7 levels.  No acute soft tissue abnormalities are identified.  IMPRESSION: Stable examination demonstrating no evidence of acute fracture, subluxation or static signs of instability.  Similar spondylosis.   Original Report Authenticated By: Carey Bullocks, M.D.    Dg Humerus Right  02/11/2013  *RADIOLOGY REPORT*  Clinical Data: Fall.  RIGHT HUMERUS - 2+ VIEW   Comparison: None.  Findings: There is an oblique fracture of the proximal shaft of the humerus about 6 cm distal to the surgical neck.  The principle distal fragment is displaced anteriorly and medially greater than 50%. It also is angulated medially about 20 degrees.  There is no additional evidence of acute skeletal trauma.  The shoulder joint and the elbow joint are normally maintained.  IMPRESSION: Displaced fracture of the proximal right humerus.   Original Report Authenticated By: Sander Radon, M.D.      1. Fall at home, initial encounter   2. Fracture, humerus, proximal, right, closed, initial encounter   3. Axillary nerve injury, right, initial encounter    Disposition pending   MDM   I personally performed the services described in this documentation, which was scribed in my presence. The recorded information has been reviewed and considered.  Devoria Albe, MD, FACEP       Ward Givens, MD 02/11/13 1600

## 2013-02-11 NOTE — ED Notes (Signed)
Pt has vertigo and fell into a door, now c/o pain to right shoulder, sling applied PTA, was given a total of 2mg  of dilaudid en route IV

## 2013-02-11 NOTE — ED Provider Notes (Signed)
This chart was scribed for Glynn Octave, MD by Shari Heritage, ED Scribe. The patient was seen in room APA12/APA12. Patient's care was started at 1610.  Patient's care asumed from Dr. Devoria Albe at shift change.  Patient presents after un witnessed mechanical fall while trying to get to the bathroom. She denies syncope, lightheadedness or dizziness prior to the fall. Patient is complaining of right shoulder and upper arm pain. Patient denies headache, chest pain or any other symptoms at this time.  Physical Exam: +2 radial pulse. Cardinal hand movements intact. Paresthesias in axillary nerve distribution.   Dr. Lynelle Doctor was concerned about the patient's paresthesias in the axillary nerve distribution. Discussed with Dr. Romeo Apple who will evaluate the patient and place a splint.  Remains neurovascularly intact after splint placement.  Paresthesias have improved.  Patient to followup with Dr. Carola Frost as arranged by Dr. Romeo Apple.  BP 117/73  Pulse 86  Temp(Src) 98.5 F (36.9 C) (Oral)  Resp 20  Ht 5\' 8"  (1.727 m)  Wt 244 lb (110.678 kg)  BMI 37.11 kg/m2  SpO2 97%  I personally performed the services described in this documentation, which was scribed in my presence. The recorded information has been reviewed and is accurate.   Glynn Octave, MD 02/11/13 [phone]

## 2013-02-13 ENCOUNTER — Ambulatory Visit (HOSPITAL_COMMUNITY): Payer: Medicare Other | Admitting: Physical Therapy

## 2013-02-14 ENCOUNTER — Inpatient Hospital Stay (HOSPITAL_COMMUNITY)
Admission: EM | Admit: 2013-02-14 | Discharge: 2013-02-19 | DRG: 981 | Disposition: A | Discharge status: Skilled Nursing Facility | Payer: Medicare Other | Attending: Internal Medicine | Admitting: Internal Medicine

## 2013-02-14 ENCOUNTER — Encounter (HOSPITAL_COMMUNITY): Payer: Self-pay | Admitting: Emergency Medicine

## 2013-02-14 ENCOUNTER — Emergency Department (HOSPITAL_COMMUNITY): Payer: Medicare Other

## 2013-02-14 DIAGNOSIS — IMO0002 Reserved for concepts with insufficient information to code with codable children: Secondary | ICD-10-CM | POA: Diagnosis present

## 2013-02-14 DIAGNOSIS — D696 Thrombocytopenia, unspecified: Secondary | ICD-10-CM | POA: Diagnosis present

## 2013-02-14 DIAGNOSIS — D649 Anemia, unspecified: Secondary | ICD-10-CM

## 2013-02-14 DIAGNOSIS — R5381 Other malaise: Secondary | ICD-10-CM | POA: Diagnosis present

## 2013-02-14 DIAGNOSIS — R739 Hyperglycemia, unspecified: Secondary | ICD-10-CM

## 2013-02-14 DIAGNOSIS — E1142 Type 2 diabetes mellitus with diabetic polyneuropathy: Secondary | ICD-10-CM | POA: Diagnosis present

## 2013-02-14 DIAGNOSIS — G629 Polyneuropathy, unspecified: Secondary | ICD-10-CM | POA: Diagnosis present

## 2013-02-14 DIAGNOSIS — E785 Hyperlipidemia, unspecified: Secondary | ICD-10-CM | POA: Diagnosis present

## 2013-02-14 DIAGNOSIS — Z6838 Body mass index (BMI) 38.0-38.9, adult: Secondary | ICD-10-CM

## 2013-02-14 DIAGNOSIS — R4182 Altered mental status, unspecified: Secondary | ICD-10-CM

## 2013-02-14 DIAGNOSIS — N390 Urinary tract infection, site not specified: Principal | ICD-10-CM | POA: Diagnosis present

## 2013-02-14 DIAGNOSIS — S42309A Unspecified fracture of shaft of humerus, unspecified arm, initial encounter for closed fracture: Secondary | ICD-10-CM | POA: Diagnosis present

## 2013-02-14 DIAGNOSIS — S42301A Unspecified fracture of shaft of humerus, right arm, initial encounter for closed fracture: Secondary | ICD-10-CM

## 2013-02-14 DIAGNOSIS — E118 Type 2 diabetes mellitus with unspecified complications: Secondary | ICD-10-CM | POA: Diagnosis present

## 2013-02-14 DIAGNOSIS — S42301D Unspecified fracture of shaft of humerus, right arm, subsequent encounter for fracture with routine healing: Secondary | ICD-10-CM

## 2013-02-14 DIAGNOSIS — G9340 Encephalopathy, unspecified: Secondary | ICD-10-CM | POA: Diagnosis present

## 2013-02-14 DIAGNOSIS — E871 Hypo-osmolality and hyponatremia: Secondary | ICD-10-CM | POA: Diagnosis present

## 2013-02-14 DIAGNOSIS — E1165 Type 2 diabetes mellitus with hyperglycemia: Secondary | ICD-10-CM

## 2013-02-14 DIAGNOSIS — E876 Hypokalemia: Secondary | ICD-10-CM | POA: Clinically undetermined

## 2013-02-14 DIAGNOSIS — R42 Dizziness and giddiness: Secondary | ICD-10-CM

## 2013-02-14 DIAGNOSIS — K219 Gastro-esophageal reflux disease without esophagitis: Secondary | ICD-10-CM | POA: Diagnosis present

## 2013-02-14 DIAGNOSIS — IMO0001 Reserved for inherently not codable concepts without codable children: Secondary | ICD-10-CM | POA: Diagnosis present

## 2013-02-14 DIAGNOSIS — E669 Obesity, unspecified: Secondary | ICD-10-CM | POA: Diagnosis present

## 2013-02-14 DIAGNOSIS — G608 Other hereditary and idiopathic neuropathies: Secondary | ICD-10-CM | POA: Diagnosis present

## 2013-02-14 DIAGNOSIS — W19XXXA Unspecified fall, initial encounter: Secondary | ICD-10-CM | POA: Diagnosis present

## 2013-02-14 DIAGNOSIS — D62 Acute posthemorrhagic anemia: Secondary | ICD-10-CM | POA: Diagnosis not present

## 2013-02-14 DIAGNOSIS — K7460 Unspecified cirrhosis of liver: Secondary | ICD-10-CM | POA: Diagnosis present

## 2013-02-14 DIAGNOSIS — R531 Weakness: Secondary | ICD-10-CM | POA: Diagnosis present

## 2013-02-14 DIAGNOSIS — R29898 Other symptoms and signs involving the musculoskeletal system: Secondary | ICD-10-CM

## 2013-02-14 DIAGNOSIS — E559 Vitamin D deficiency, unspecified: Secondary | ICD-10-CM | POA: Diagnosis present

## 2013-02-14 DIAGNOSIS — K7689 Other specified diseases of liver: Secondary | ICD-10-CM | POA: Diagnosis present

## 2013-02-14 DIAGNOSIS — K5900 Constipation, unspecified: Secondary | ICD-10-CM | POA: Diagnosis present

## 2013-02-14 LAB — COMPREHENSIVE METABOLIC PANEL
ALT: 9 U/L (ref 0–35)
Alkaline Phosphatase: 74 U/L (ref 39–117)
CO2: 22 mEq/L (ref 19–32)
GFR calc Af Amer: >90 mL/min (ref >90)
GFR calc non Af Amer: 86 mL/min — ABNORMAL LOW (ref >90)
Glucose, Bld: 309 mg/dL — ABNORMAL HIGH (ref 70–99)
Potassium: 3.3 mEq/L — ABNORMAL LOW (ref 3.5–5.1)
Sodium: 133 mEq/L — ABNORMAL LOW (ref 135–145)

## 2013-02-14 LAB — URINALYSIS, ROUTINE W REFLEX MICROSCOPIC
Glucose, UA: >1000 mg/dL — AB
Ketones, ur: 15 mg/dL — AB
Nitrite: POSITIVE — AB
Protein, ur: 30 mg/dL — AB
pH: 5.5 (ref 5.0–8.0)

## 2013-02-14 LAB — CBC WITH DIFFERENTIAL/PLATELET
Eosinophils Relative: 1 % (ref 0–5)
Hemoglobin: 12.2 g/dL (ref 12.0–15.0)
Lymphocytes Relative: 15 % (ref 12–46)
Lymphs Abs: 1.9 10*3/uL (ref 0.7–4.0)
MCV: 81.4 fL (ref 78.0–100.0)
Monocytes Relative: 10 % (ref 3–12)
Neutrophils Relative %: 74 % (ref 43–77)
Platelets: 145 10*3/uL — ABNORMAL LOW (ref 150–400)
RBC: 4.15 MIL/uL (ref 3.87–5.11)
WBC: 12.8 10*3/uL — ABNORMAL HIGH (ref 4.0–10.5)

## 2013-02-14 LAB — PROTIME-INR: INR: 1.16 (ref 0.00–1.49)

## 2013-02-14 LAB — URINE MICROSCOPIC-ADD ON

## 2013-02-14 LAB — MAGNESIUM: Magnesium: 2.1 mg/dL (ref 1.5–2.5)

## 2013-02-14 LAB — HEMOGLOBIN A1C: Hgb A1c MFr Bld: 8.1 % — ABNORMAL HIGH (ref <5.7)

## 2013-02-14 MED ORDER — INSULIN ASPART 100 UNIT/ML ~~LOC~~ SOLN
6.0000 [IU] | Freq: Three times a day (TID) | SUBCUTANEOUS | Status: DC
  Administered 2013-02-14: 6 [IU] via SUBCUTANEOUS
  Administered 2013-02-15: 12:00:00 via SUBCUTANEOUS
  Administered 2013-02-15 – 2013-02-16 (×5): 6 [IU] via SUBCUTANEOUS

## 2013-02-14 MED ORDER — OXYCODONE HCL 5 MG PO TABS
5.0000 mg | ORAL_TABLET | ORAL | Status: DC | PRN
  Administered 2013-02-14 – 2013-02-18 (×13): 5 mg via ORAL
  Filled 2013-02-14 (×13): qty 1

## 2013-02-14 MED ORDER — LACTULOSE 10 GM/15ML PO SOLN
40.0000 g | Freq: Every day | ORAL | Status: DC
  Filled 2013-02-14: qty 60

## 2013-02-14 MED ORDER — ALPRAZOLAM 0.5 MG PO TABS
0.5000 mg | ORAL_TABLET | Freq: Three times a day (TID) | ORAL | Status: DC | PRN
  Administered 2013-02-14 – 2013-02-18 (×6): 0.5 mg via ORAL
  Filled 2013-02-14 (×6): qty 1

## 2013-02-14 MED ORDER — FENTANYL CITRATE 0.05 MG/ML IJ SOLN
100.0000 ug | Freq: Once | INTRAMUSCULAR | Status: AC
  Administered 2013-02-14: 100 ug via INTRAVENOUS
  Filled 2013-02-14: qty 2

## 2013-02-14 MED ORDER — LINAGLIPTIN 5 MG PO TABS
5.0000 mg | ORAL_TABLET | Freq: Every day | ORAL | Status: DC
  Administered 2013-02-15 – 2013-02-16 (×2): 5 mg via ORAL
  Filled 2013-02-14 (×4): qty 1

## 2013-02-14 MED ORDER — DEXTROSE 5 % IV SOLN
1.0000 g | Freq: Once | INTRAVENOUS | Status: AC
  Administered 2013-02-14: 1 g via INTRAVENOUS
  Filled 2013-02-14: qty 10

## 2013-02-14 MED ORDER — SODIUM CHLORIDE 0.9 % IV BOLUS (SEPSIS)
500.0000 mL | Freq: Once | INTRAVENOUS | Status: AC
  Administered 2013-02-14: 500 mL via INTRAVENOUS

## 2013-02-14 MED ORDER — PANTOPRAZOLE SODIUM 40 MG PO TBEC
80.0000 mg | DELAYED_RELEASE_TABLET | Freq: Every day | ORAL | Status: DC
  Administered 2013-02-15 – 2013-02-19 (×4): 80 mg via ORAL
  Filled 2013-02-14: qty 1
  Filled 2013-02-14 (×5): qty 2

## 2013-02-14 MED ORDER — ONDANSETRON HCL 4 MG/2ML IJ SOLN: 4.0000 mg | Freq: Four times a day (QID) | INTRAMUSCULAR | Status: DC | PRN

## 2013-02-14 MED ORDER — SODIUM CHLORIDE 0.9 % IV SOLN: INTRAVENOUS | Status: DC

## 2013-02-14 MED ORDER — ACETAMINOPHEN 325 MG PO TABS
650.0000 mg | ORAL_TABLET | Freq: Four times a day (QID) | ORAL | Status: DC | PRN
  Administered 2013-02-14 – 2013-02-15 (×2): 650 mg via ORAL
  Filled 2013-02-14 (×2): qty 2

## 2013-02-14 MED ORDER — GUAIFENESIN-DM 100-10 MG/5ML PO SYRP
5.0000 mL | ORAL_SOLUTION | ORAL | Status: DC | PRN
  Filled 2013-02-14: qty 5

## 2013-02-14 MED ORDER — ASPIRIN EC 81 MG PO TBEC
81.0000 mg | DELAYED_RELEASE_TABLET | Freq: Every day | ORAL | Status: DC
  Administered 2013-02-15 – 2013-02-19 (×4): 81 mg via ORAL
  Filled 2013-02-14 (×5): qty 1

## 2013-02-14 MED ORDER — POTASSIUM CHLORIDE CRYS ER 20 MEQ PO TBCR
40.0000 meq | EXTENDED_RELEASE_TABLET | Freq: Every day | ORAL | Status: AC
  Administered 2013-02-14: 40 meq via ORAL
  Filled 2013-02-14 (×2): qty 2

## 2013-02-14 MED ORDER — ALUM & MAG HYDROXIDE-SIMETH 200-200-20 MG/5ML PO SUSP: 30.0000 mL | Freq: Four times a day (QID) | ORAL | Status: DC | PRN

## 2013-02-14 MED ORDER — INSULIN ASPART 100 UNIT/ML ~~LOC~~ SOLN
0.0000 [IU] | Freq: Every day | SUBCUTANEOUS | Status: DC
Start: 2013-02-14 — End: 2013-02-19
  Administered 2013-02-14 – 2013-02-15 (×2): 4 [IU] via SUBCUTANEOUS
  Administered 2013-02-16: 3 [IU] via SUBCUTANEOUS
  Administered 2013-02-17 – 2013-02-19 (×2): 2 [IU] via SUBCUTANEOUS

## 2013-02-14 MED ORDER — DIPHENHYDRAMINE HCL 12.5 MG/5ML PO ELIX
12.5000 mg | ORAL_SOLUTION | Freq: Four times a day (QID) | ORAL | Status: DC | PRN
  Administered 2013-02-14 – 2013-02-18 (×3): 12.5 mg via ORAL
  Filled 2013-02-14 (×3): qty 10

## 2013-02-14 MED ORDER — ONDANSETRON HCL 4 MG PO TABS: 4.0000 mg | ORAL_TABLET | Freq: Four times a day (QID) | ORAL | Status: DC | PRN

## 2013-02-14 MED ORDER — SENNA 8.6 MG PO TABS
1.0000 | ORAL_TABLET | Freq: Every day | ORAL | Status: DC
  Administered 2013-02-14 – 2013-02-19 (×3): 8.6 mg via ORAL
  Filled 2013-02-14 (×7): qty 1

## 2013-02-14 MED ORDER — ASPIRIN 81 MG PO TBEC: 81.0000 mg | DELAYED_RELEASE_TABLET | Freq: Every day | ORAL | Status: DC

## 2013-02-14 MED ORDER — ALBUTEROL SULFATE (5 MG/ML) 0.5% IN NEBU: 2.5000 mg | INHALATION_SOLUTION | RESPIRATORY_TRACT | Status: DC | PRN

## 2013-02-14 MED ORDER — BUDESONIDE-FORMOTEROL FUMARATE 160-4.5 MCG/ACT IN AERO
2.0000 | INHALATION_SPRAY | Freq: Two times a day (BID) | RESPIRATORY_TRACT | Status: DC
  Administered 2013-02-14 – 2013-02-19 (×10): 2 via RESPIRATORY_TRACT
  Filled 2013-02-14 (×2): qty 6

## 2013-02-14 MED ORDER — CLOTRIMAZOLE 1 % EX CREA
TOPICAL_CREAM | Freq: Two times a day (BID) | CUTANEOUS | Status: DC
  Administered 2013-02-14 – 2013-02-19 (×8): via TOPICAL
  Filled 2013-02-14 (×2): qty 15

## 2013-02-14 MED ORDER — ACETAMINOPHEN 650 MG RE SUPP: 650.0000 mg | Freq: Four times a day (QID) | RECTAL | Status: DC | PRN

## 2013-02-14 MED ORDER — POTASSIUM CHLORIDE IN NACL 20-0.9 MEQ/L-% IV SOLN
INTRAVENOUS | Status: DC
  Administered 2013-02-14: 60 mL/h via INTRAVENOUS
  Filled 2013-02-14 (×2): qty 1000

## 2013-02-14 MED ORDER — INSULIN ASPART 100 UNIT/ML ~~LOC~~ SOLN
0.0000 [IU] | Freq: Three times a day (TID) | SUBCUTANEOUS | Status: DC
  Administered 2013-02-14: 15 [IU] via SUBCUTANEOUS
  Administered 2013-02-15: 4 [IU] via SUBCUTANEOUS
  Administered 2013-02-15: 11 [IU] via SUBCUTANEOUS
  Administered 2013-02-15: 4 [IU] via SUBCUTANEOUS
  Administered 2013-02-16 – 2013-02-18 (×5): 7 [IU] via SUBCUTANEOUS
  Administered 2013-02-18: 11 [IU] via SUBCUTANEOUS
  Administered 2013-02-19: 4 [IU] via SUBCUTANEOUS
  Administered 2013-02-19: 3 [IU] via SUBCUTANEOUS

## 2013-02-14 MED ORDER — ENOXAPARIN SODIUM 40 MG/0.4ML ~~LOC~~ SOLN
40.0000 mg | SUBCUTANEOUS | Status: DC
  Administered 2013-02-14: 40 mg via SUBCUTANEOUS
  Filled 2013-02-14 (×2): qty 0.4

## 2013-02-14 MED ORDER — SODIUM CHLORIDE 0.9 % IV BOLUS (SEPSIS)
2000.0000 mL | Freq: Once | INTRAVENOUS | Status: AC
  Administered 2013-02-14: 2000 mL via INTRAVENOUS

## 2013-02-14 MED ORDER — DEXTROSE 5 % IV SOLN
1.0000 g | Freq: Every morning | INTRAVENOUS | Status: DC
  Administered 2013-02-15 – 2013-02-19 (×5): 1 g via INTRAVENOUS
  Filled 2013-02-14 (×8): qty 10

## 2013-02-14 MED ORDER — INSULIN DETEMIR 100 UNIT/ML ~~LOC~~ SOLN
60.0000 [IU] | Freq: Every day | SUBCUTANEOUS | Status: DC
  Administered 2013-02-14 – 2013-02-16 (×3): 60 [IU] via SUBCUTANEOUS
  Filled 2013-02-14 (×3): qty 0.6

## 2013-02-14 MED ORDER — DIPHENHYDRAMINE HCL 50 MG/ML IJ SOLN
25.0000 mg | Freq: Once | INTRAMUSCULAR | Status: AC
  Administered 2013-02-14: 25 mg via INTRAVENOUS
  Filled 2013-02-14: qty 1

## 2013-02-14 MED ORDER — PREGABALIN 50 MG PO CAPS
100.0000 mg | ORAL_CAPSULE | Freq: Every day | ORAL | Status: DC
  Administered 2013-02-14 – 2013-02-19 (×5): 100 mg via ORAL
  Filled 2013-02-14 (×5): qty 2

## 2013-02-14 MED ORDER — ENALAPRIL MALEATE 5 MG PO TABS
5.0000 mg | ORAL_TABLET | Freq: Every day | ORAL | Status: DC
  Administered 2013-02-15 – 2013-02-19 (×4): 5 mg via ORAL
  Filled 2013-02-14 (×5): qty 1

## 2013-02-14 NOTE — ED Notes (Signed)
[facility] EMT completed manual BP: 119/51

## 2013-02-14 NOTE — H&P (Signed)
[facility] History and Physical  [name] [name]:[id number] DOB: 09/05/1941 DOA: 02/14/2013  Referring physician: ED physician, Dr. Fonnie Jarvis PCP: Colette Ribas, MD  Specialists: orthopedic surgeon, Dr. Romeo Apple; gastroenterologist, Dr. Jena Gauss.  Chief Complaint: Confusion and generalized weakness.  HPI: [name] [name] is a 72 y.o. female  with a past medical history significant for chronic bilateral lower extremity weakness, [name] cirrhosis, peripheral neuropathy, morbid obesity, and uncontrolled diabetes, who presents to the hospital today with her family because of reported generalized weakness and altered mental status. Currently, the patient is alert and oriented. Her daughter, who is in the room, states that she appears to be back to herself. The patient has a complaint of right arm pain which she has had since she sustained a humeral fracture a few days ago after falling. According to her daughter, she has not been quite the same since the fracture. She has had more generalized weakness, slightly more confusion, and a decrease in her appetite. She has had intermittent lower abdominal pain, but she denies severe pain. She has had some discomfort with urination. She denies nausea, vomiting, and diarrhea. She has had no chest pain or worsening shortness of breath.   In the emergency department, she is borderline febrile with temperature of 99.7 and hemodynamically stable. Her urinalysis is consistent with infection. Her white blood cell count is 12.8, platelet count 145, sodium 133, and potassium is 3.3. Her ammonia level is 45. Her glucose is 309. Her total bilirubin is 3.6. All of her other laboratory data are unremarkable. She is being admitted for further evaluation and management.   Review of Systems:  positive as above in history present illness. The patient denies weight loss,, vision loss, decreased hearing, hoarseness, chest pain, syncope,, hemoptysis, melena, hematochezia, severe  indigestion/heartburn, hematuria, genital sores, suspicious skin lesions, transient blindness, depression, unusual weight change, abnormal bleeding, enlarged lymph nodes, angioedema, and breast masses.    Past Medical History  Diagnosis Date  . CAD in native artery   . Thrombocytopenia, unspecified   . Unspecified hereditary and idiopathic peripheral neuropathy   . Unspecified fall   . Other and unspecified hyperlipidemia   . Unspecified essential hypertension   . Obesity, unspecified   . Shortness of breath   . Personal history of neurosis   . Intrinsic asthma, unspecified   . Allergic rhinitis, cause unspecified   . Cirrhosis of liver     NASH, afp on 06/17/12 =3.7, per pt she had hep B vaccines in 1996  . Colon adenomas 03/08/10    tcs by Dr. Jena Gauss  . Hyperplastic polyps of stomach 03/08/10    tcs by Dr. Geni Bers  . Chronic gastritis 03/09/11    egd by Dr. Jena Gauss  . History of hemorrhoids 03/08/10    tcs- internal and external  . Diverticula of colon 03/08/10    L side  . Hiatal hernia 03/08/10  . Esophagitis, erosive 03/08/10  . GERD (gastroesophageal reflux disease)   . Wears glasses   . Type II or unspecified type diabetes mellitus without mention of complication, not stated as uncontrolled   . Vertigo    Past Surgical History  Procedure Laterality Date  . Hysterectomy and btl      s/p  . Abdominal hysterectomy    . Tumor excision  2003    rt arm and left foot  . Colonoscopy  03/08/10    Dr. [name]-->ext/int hemorrhoids, anal paipilla, rectal polyps, desc polyps, cecal polyp, left-sided diverticula. suboptiomal prep. next  TCS 02/2015. multiple adenomas  . Esophagogastroduodenoscopy  03/08/10    Dr. Dellie Catholic erosive RE, small hh, antral erosions, small 1cm are of mucosal indentation along gastric body of doubtful significance, cystic nodularity of hypophyarynx, base of tongue, base of epiglottis. benign gastric biopsies  . Tubal ligation    . Esophagogastroduodenoscopy   10/08/2011    Dr. Dionisio David esophageal varices, antral erosion. next egd 09/2013  . Foot surgery  2003    lt foot  . Breast surgery    . Eye surgery      cataracts   Social History: The patient is married. She lives in [place]. She has 3 children. She is retired. She denies tobacco, alcohol, and illicit drug use. She is undergoing outpatient therapy for her right arm fracture 3 times weekly.    Allergies  Allergen Reactions  . Prochlorperazine Edisylate Other (See Comments)    Causes anxiety/numbness  . Compazine (Prochlorperazine Edisylate) Anxiety    Causes anxiety & nervousness    Family History  Problem Relation Age of Onset  . Coronary artery disease      FH  . Diabetes      FH  . Heart disease Mother   . Colon cancer Neg Hx   Her parents are deceased. Her mother had diabetes and heart disease. Her father had heart disease.   Prior to Admission medications   Medication Sig Start Date End Date Taking? Authorizing Provider  ALPRAZolam Prudy Feeler) 0.5 MG tablet Take 0.5 mg by mouth 3 (three) times daily as needed for anxiety. 08/09/12  Yes Erick Blinks, MD  aspirin (BAYER LOW STRENGTH) 81 MG EC tablet Take 81 mg by mouth daily.     Yes Historical Provider, MD  budesonide-formoterol (SYMBICORT) 160-4.5 MCG/ACT inhaler Inhale 2 puffs into the lungs 2 (two) times daily.     Yes Historical Provider, MD  clotrimazole (LOTRIMIN) 1 % cream Apply topically 2 (two) times daily. 03/28/12 03/28/13 Yes Nimish Normajean Glasgow, MD  enalapril (VASOTEC) 5 MG tablet Take 5 mg by mouth daily.     Yes Historical Provider, MD  esomeprazole (NEXIUM) 40 MG capsule Take 40 mg by mouth daily before breakfast.     Yes Historical Provider, MD  HYDROcodone-acetaminophen (NORCO) 10-325 MG per tablet Take 1 tablet by mouth every 4 (four) hours as needed for pain. 02/11/13  Yes Vickki Hearing, MD  insulin aspart (NOVOLOG) 100 UNIT/ML injection Inject 10-40 Units into the skin daily as needed. Per sliding scale.  Patient injects 10-40 units for levels above 150 to 300    Yes Historical Provider, MD  insulin detemir (LEVEMIR) 100 UNIT/ML injection Inject 60 Units into the skin at bedtime.    Yes Historical Provider, MD  lactulose (CHRONULAC) 10 GM/15ML solution Take 60 mLs by mouth daily.    Yes Historical Provider, MD  linagliptin (TRADJENTA) 5 MG TABS tablet Take 5 mg by mouth daily.   Yes Historical Provider, MD  meclizine (ANTIVERT) 25 MG tablet Take 25 mg by mouth 3 (three) times daily as needed for dizziness or nausea.    Yes Historical Provider, MD  Multiple Vitamins-Minerals (CENTRUM SILVER PO) Take 1 tablet by mouth daily.    Yes Historical Provider, MD  naproxen sodium (ALEVE) 220 MG tablet Take 440 mg by mouth daily as needed (pain).    Yes Historical Provider, MD  oxyCODONE (OXY IR/ROXICODONE) 5 MG immediate release tablet Take 5 mg by mouth every 4 (four) hours as needed for pain. 02/11/13  Yes Glynn Octave,  MD  pregabalin (LYRICA) 100 MG capsule Take 100 mg by mouth 2 (two) times daily.    Yes Historical Provider, MD   Physical Exam: Filed Vitals:   02/14/13 1432 02/14/13 1437 02/14/13 1550 02/14/13 1629  BP: 110/49 119/51 121/55 122/64  Pulse:   88 83  Temp:   99.7 F (37.6 C) 98.4 F (36.9 C)  TempSrc:   Oral   Resp:   17   SpO2:   98%      General: Alert, morbidly obese 72 year old Caucasian woman laying in bed, in no acute distress.  Head: No nodules or abrasions.  Eyes: pupils are equal, round, reactive to light. Extraocular movements are intact.  ENT: oropharynx reveals mildly dry mucous membranes. No posterior exudates or erythema.  Neck: obese, supple, no thyromegaly, no JVD.  Cardiovascular: S1, S2, with no murmurs rubs or gallops.  Respiratory: clear anteriorly with decreased breath sounds in the bases. Breathing is nonlabored.  Abdomen: morbidly obese, positive bowel sounds, soft, mildly tender over the lower abdomen/suprapubic area. No masses palpated. No  distention. No obvious hepatosplenomegaly.  Skin: mild abrasion on her right knee and scant ecchymosis on her left knee.  Musculoskeletal: right upper extremity is in a splint (not taken off). She is able to wiggle  her fingers. Pedal pulses palpable bilaterally.  Psychiatric: flat affect. Her speech is clear.   Neurologic: Cranial nerves II through XII are grossly intact. Her speech is clear. She is alert and oriented x3. She has a good recall of her medical history. Good handgrip on the left. She is able to raise each of her legs against gravity approximately 40.   Labs on Admission:  Basic Metabolic Panel:  Recent Labs Lab 02/14/13 1200  NA 133*  K 3.3*  CL 100  CO2 22  GLUCOSE 309*  BUN 14  CREATININE 0.70  CALCIUM 8.2*   Liver Function Tests:  Recent Labs Lab 02/14/13 1200  AST 20  ALT 9  ALKPHOS 74  BILITOT 3.6*  PROT 5.7*  ALBUMIN 3.0*   No results found for this basename: LIPASE, AMYLASE,  in the last 168 hours  Recent Labs Lab 02/14/13 1505  AMMONIA 45   CBC:  Recent Labs Lab 02/14/13 1200  WBC 12.8*  NEUTROABS 9.5*  HGB 12.2  HCT 33.8*  MCV 81.4  PLT 145*   Cardiac Enzymes: No results found for this basename: CKTOTAL, CKMB, CKMBINDEX, TROPONINI,  in the last 168 hours  BNP (last 3 results) No results found for this basename: PROBNP,  in the last 8760 hours CBG:  Recent Labs Lab 02/14/13 1217  GLUCAP 289*    Radiological Exams on Admission: Dg Chest 1 View  02/14/2013  *RADIOLOGY REPORT*  Clinical Data: Weakness and recent right humeral fracture.  CHEST - 1 VIEW  Comparison: 11/28/2012  Findings: Lung volumes are low and the heart is moderately enlarged.  There is some prominence of pulmonary vascularity without overt edema.  No pleural fluid or pneumothorax.  IMPRESSION: Low lung volumes and cardiomegaly.   Original Report Authenticated By: Irish Lack, M.D.    Dg Humerus Right  02/14/2013  *RADIOLOGY REPORT*  Clinical Data:  Increased pain at site of recent humeral fracture.  RIGHT HUMERUS - 2+ VIEW  Comparison: 02/11/2013  Findings: The proximal humeral shaft fracture demonstrates similar degree of displacement and angulation since the prior study with maximal displacement of approximately a full shaft width.  No associated dislocation.  Stable soft tissue prominence.  IMPRESSION: Similar displacement  and angulation at the level of the proximal shaft fracture of the right humerus.   Original Report Authenticated By: Irish Lack, M.D.     EKG: not ordered.  Assessment/Plan Principal Problem:   Encephalopathy acute Active Problems:   UTI (urinary tract infection)   OBESITY   THROMBOCYTOPENIA, CHRONIC   CIRRHOSIS   Uncontrolled type 2 diabetes mellitus with complication   Peripheral neuropathy   Muscle weakness (generalized)   Fracture of humeral shaft, right, closed   Hyponatremia   Hypokalemia   1. Acute encephalopathy and worsening generalized weakness. Her symptomatology is likely secondary to the urinary tract infection. Her ammonia level is normal, so doubt hepatic encephalopathy. Medication change is also a consideration as the patient had been taking hydrocodone but was recently switched to oxycodone due to worsening pain from her fracture. She does not believe the oxycodone is causing confusion. Nevertheless, she appears to be back to her baseline and there were no confusion or encephalopathy per my assessment and exam. 2. Urinary tract infection. 3. Hyponatremia. This is mild. Likely secondary to mild hypovolemia. 4. Hypokalemia. 5. Recent humeral shaft fracture on the right. She is followed by orthopedic surgeon Dr. Romeo Apple in [place]. We'll continue splint, therapy, and analgesics as needed. 6. Chronic generalized weakness, peripheral neuropathy, gait disorder. 7. Nash cirrhosis. She is followed by gastroenterologist Dr. Jena Gauss. She has hyperbilirubinemia and thrombocytopenia, both secondary to  cirrhosis. 8. Uncontrolled type 2 diabetes mellitus with peripheral neuropathy. We'll continue insulin with adjustments to try to improve her glycemic control.     Plan: 1. The patient was given IV fluids and IV Rocephin in the emergency department. 2. We'll continue gentle IV fluids. We'll continue Rocephin. 3. We'll continue Levemir. Will increase dose if needed. Start sliding scale NovoLog with mealtime coverage NovoLog. Adjust accordingly. We'll order hemoglobin A1c. 4. Will replete potassium chloride orally and in the IV fluids. 5. Order physical therapy and occupational therapy. 6. Urine culture was ordered in the ED. For additional evaluation, we'll order TSH and blood magnesium level.     Code Status: Full code.  Family Communication: discussed with her daughter, Angie Disposition Plan: anticipate discharge to home in 24-48 hours.   Time spent: 1 hour.  [facility] Pager [phone]  If 7PM-7AM, please contact night-coverage [email or web address] Password [email or web address] 02/14/2013, 4:30 PM

## 2013-02-14 NOTE — ED Notes (Signed)
Sling placed back on pts right arm after returning from Xray

## 2013-02-14 NOTE — ED Notes (Signed)
Report given to floor nurse Claris Che, RN. Nurse has no further questions upon report; pt will be transported to floor via [place], EMT

## 2013-02-14 NOTE — ED Provider Notes (Signed)
History     CSN: [id number]  Arrival date & time 02/14/13  1106   First MD Initiated Contact with Patient 02/14/13 1112      Chief Complaint  Patient presents with  . Arm Injury  AMS Weakness  (Consider location/radiation/quality/duration/timing/severity/associated sxs/prior treatment) HPI This 72 year old female has coronary artery disease, diabetes, cirrhosis, history of hepatic encephalopathy, at baseline has generalized weakness and walks with a walker, several days ago she fell and suffered a right humerus fracture, since she was discharged from the emergency department at any [facility] hospital she is developed progressively worsening weakness with new onset mild altered mental status and family is unable to care for her at home, they have a wheelchair but is still takes two assistants just to try to get the patient out of the wheelchair, she is now too weak to stand without two assistants, she is constant severe pain to the right upper arm since her fall worse with palpation and position changes, the splint was placed and heard any [place] has dislodged, she has continuous unchanged numbness to the upper arm from 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 Treatment prior to arrival consisted of either hydrocodone or oxycodone which made her itch and did not help her arm pain much but she states all narcotics make her itch and she takes Benadryl  whenever she takes narcotics. Past Medical History  Diagnosis Date  . CAD in native artery   . Thrombocytopenia, unspecified   . Unspecified hereditary and idiopathic peripheral neuropathy   . Unspecified fall   . Other and unspecified hyperlipidemia   . Unspecified essential hypertension   . Obesity, unspecified   . Shortness of breath   . Personal history of neurosis   . Intrinsic asthma, unspecified   . Allergic rhinitis, cause unspecified   . Cirrhosis of liver     NASH, afp on 06/17/12 =3.7, per pt she had hep B vaccines in 1996  . Colon adenomas 03/08/10    tcs by Dr. Jena Gauss  . Hyperplastic polyps of stomach 03/08/10    tcs by Dr. Geni Bers  . Chronic gastritis 03/09/11    egd by Dr. Jena Gauss  . History of hemorrhoids 03/08/10    tcs- internal and external  . Diverticula of colon 03/08/10    L side  . Hiatal hernia 03/08/10  . Esophagitis, erosive 03/08/10  . GERD (gastroesophageal reflux disease)   . Wears glasses   . Type II or unspecified type diabetes mellitus without mention of complication, not stated as uncontrolled   . Vertigo     Past Surgical History  Procedure Laterality Date  . Hysterectomy and btl      s/p  . Abdominal hysterectomy    . Tumor excision  2003    rt arm and left foot  . Colonoscopy  03/08/10    Dr. [name]-->ext/int hemorrhoids, anal paipilla, rectal polyps, desc polyps, cecal polyp, left-sided diverticula. suboptiomal prep. next  TCS 02/2015. multiple adenomas  . Esophagogastroduodenoscopy  03/08/10    Dr. Dellie Catholic erosive RE, small hh, antral erosions, small 1cm are of mucosal indentation along gastric body of doubtful significance, cystic nodularity of hypophyarynx, base of tongue, base of epiglottis. benign gastric biopsies  . Tubal ligation    . Esophagogastroduodenoscopy  10/08/2011    Dr. Dionisio David esophageal varices, antral erosion. next egd 09/2013  . Foot surgery  2003    lt foot  . Breast surgery    . Eye surgery      cataracts    Family  History  Problem Relation Age of Onset  . Coronary artery disease      FH  . Diabetes      FH  . Heart disease Mother   . Colon cancer Neg Hx     History  Substance Use Topics  . Smoking status: Never Smoker   . Smokeless tobacco: Not on file  . Alcohol Use: No    OB History   Grav Para Term Preterm Abortions TAB SAB Ect Mult Living                  Review of Systems 10 Systems reviewed and are negative for acute change except as noted in the HPI. Allergies  Prochlorperazine edisylate and Compazine  Home Medications   No current outpatient prescriptions on file.  BP 124/58  Pulse 82  Temp(Src) 98.4 F (36.9 C) (Oral)  Resp 16  Ht 5\' 8"  (1.727 m)  Wt 251 lb 5.2 oz (114 kg)  BMI 38.22 kg/m2  SpO2 99%  Physical Exam  Nursing note and vitals reviewed. Constitutional:  Awake, alert, nontoxic appearance with baseline speech for patient.  HENT:  Head: Atraumatic.  Mouth/Throat: No oropharyngeal exudate.  Eyes: EOM are normal. Pupils are equal, round, and reactive to light. Right eye exhibits no discharge. Left eye exhibits no discharge.  Neck: Neck supple.  Cardiovascular: Normal rate and regular rhythm.   No murmur heard. Pulmonary/Chest: Effort normal and breath sounds normal. No stridor. No respiratory distress. She has no wheezes. She has no rales. She exhibits no tenderness.  Abdominal: Soft. Bowel sounds are normal. She exhibits no mass. There is no tenderness. There is no rebound.  Musculoskeletal: She exhibits tenderness. She exhibits no edema.  Baseline ROM left arm and legs, moves extremities with no obvious new focal weakness. 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  Neurological:  Awake, alert at times but drowsy and falls asleep easily awakes to voice, cooperative and aware of situation but admits she is confused to time and that is unusual for her she is oriented to person and place but not to time; motor strength 3/5 bilaterally; sensation to light touch baseline all 4 extremities other than right upper arm has new decreased light touch which she has had consistently since her fall, she has baseline decreased light touch to her right hand from prior neuropathy; peripheral visual fields full to confrontation; no facial asymmetry; tongue midline; major cranial nerves appear intact; no pronator drift, normal finger to nose left arm  Skin: No rash noted.  Psychiatric: She has a normal mood and affect.  ED Course  Procedures (including critical care time) Patient / Family / Caregiver understand and agree with initial ED impression and plan with expectations set for ED visit. [facility] paged for admit when initial labs back with UTI (ammonia pending) Pt remains awake alert cooperative in ED and no longer feels sleepy like upon arrival. Automated BP cuff reading systolic BP 50-60s but Pt not presyncopal and no new clinical Sxs so ordered manual BP check. (lactate still pending)1430 D/w [facility] for admit, manual SBP 119 but feel IVF bolus reasonable since Pt has had little po intake for a few days.1445  Labs Reviewed  CBC WITH DIFFERENTIAL - Abnormal; Notable for the following:    WBC 12.8 (*)    HCT 33.8 (*)    MCHC 36.1 (*)    Platelets 145 (*)    Neutro Abs 9.5 (*)    Monocytes Absolute 1.3 (*)    All other components within normal limits  COMPREHENSIVE METABOLIC PANEL - Abnormal; Notable for the following:    Sodium 133 (*)    Potassium 3.3 (*)    Glucose, Bld 309 (*)    Calcium 8.2 (*)    Total Protein 5.7 (*)    Albumin 3.0 (*)    Total Bilirubin 3.6 (*)    GFR calc non Af Amer 86 (*)    All other components within  normal limits  URINALYSIS, ROUTINE W REFLEX MICROSCOPIC - Abnormal; Notable for the following:    Color, Urine AMBER (*)    APPearance TURBID (*)    Specific Gravity, Urine 1.039 (*)    Glucose, UA >1000 (*)    Hgb urine dipstick LARGE (*)    Bilirubin Urine SMALL (*)    Ketones, ur 15 (*)    Protein, ur 30 (*)    Nitrite POSITIVE (*)    Leukocytes, UA MODERATE (*)    All other components within normal limits  GLUCOSE, CAPILLARY - Abnormal; Notable for the following:    Glucose-Capillary 289 (*)    All other components within normal limits  URINE MICROSCOPIC-ADD ON - Abnormal; Notable for the following:    Bacteria, UA MANY (*)    All other components within normal limits  HEMOGLOBIN A1C - Abnormal; Notable for the following:    Hemoglobin A1C 8.1 (*)    Mean Plasma Glucose 186 (*)    All other components within normal limits  COMPREHENSIVE METABOLIC PANEL - Abnormal; Notable for the following:    Glucose, Bld 206 (*)    Calcium 8.3 (*)    Total Protein 5.3 (*)    Albumin 2.6 (*)    Total Bilirubin 2.4 (*)    GFR calc non Af Amer 66 (*)    GFR calc Af Amer 76 (*)    All other components within normal limits  CBC - Abnormal; Notable for the following:    Hemoglobin 11.5 (*)    HCT 32.4 (*)    All other components within normal limits  GLUCOSE, CAPILLARY - Abnormal; Notable for the following:    Glucose-Capillary 308 (*)    All other components within normal limits  GLUCOSE, CAPILLARY - Abnormal; Notable for the following:    Glucose-Capillary 305 (*)    All other components within normal limits  GLUCOSE, CAPILLARY - Abnormal; Notable for the following:    Glucose-Capillary 199 (*)    All other components within normal limits  GLUCOSE, CAPILLARY - Abnormal; Notable for the following:    Glucose-Capillary 290 (*)  All other components within normal limits  GLUCOSE, CAPILLARY - Abnormal; Notable for the following:    Glucose-Capillary 200 (*)    All other components  within normal limits  GLUCOSE, CAPILLARY - Abnormal; Notable for the following:    Glucose-Capillary 302 (*)    All other components within normal limits  URINE CULTURE  PROTIME-INR  AMMONIA  MAGNESIUM  TSH  CBC  COMPREHENSIVE METABOLIC PANEL   Dg Chest 1 View  02/14/2013  *RADIOLOGY REPORT*  Clinical Data: Weakness and recent right humeral fracture.  CHEST - 1 VIEW  Comparison: 11/28/2012  Findings: Lung volumes are low and the heart is moderately enlarged.  There is some prominence of pulmonary vascularity without overt edema.  No pleural fluid or pneumothorax.  IMPRESSION: Low lung volumes and cardiomegaly.   Original Report Authenticated By: Irish Lack, M.D.    US Abdomen Limited  02/16/2013  *RADIOLOGY REPORT*  Clinical Data: Abdominal discomfort.  Assess for ascites.  History of cirrhosis and diabetes.  LIMITED ABDOMEN ULTRASOUND FOR ASCITES  Technique:  Limited ultrasound survey for ascites was performed in all four abdominal quadrants.  Comparison:  Abdominal ultrasound performed 08/29/2012  Findings: Evaluation of all four quadrants of the abdomen demonstrates no evidence for ascites.  No focal abnormalities are identified.  IMPRESSION: No ultrasonographic evidence of ascites within the abdomen.   Original Report Authenticated By: Tonia Ghent, M.D.    Dg Humerus Right  02/14/2013  *RADIOLOGY REPORT*  Clinical Data: Increased pain at site of recent humeral fracture.  RIGHT HUMERUS - 2+ VIEW  Comparison: 02/11/2013  Findings: The proximal humeral shaft fracture demonstrates similar degree of displacement and angulation since the prior study with maximal displacement of approximately a full shaft width.  No associated dislocation.  Stable soft tissue prominence.  IMPRESSION: Similar displacement and angulation at the level of the proximal shaft fracture of the right humerus.   Original Report Authenticated By: Irish Lack, M.D.      1. Altered mental status   2. UTI (urinary tract  infection)   3. Fracture of humeral shaft, right, closed, with routine healing, subsequent encounter   4. Generalized weakness   5. Hyperglycemia   6. Encephalopathy acute   7. Hyponatremia   8. Uncontrolled type 2 diabetes mellitus with complication   9. Cirrhosis of liver without mention of alcohol       MDM  The patient appears reasonably stabilized for admission considering the current resources, flow, and capabilities available in the ED at this time, and I doubt any other [facility] requiring further screening and/or treatment in the ED prior to admission.        Hurman Horn, MD 02/16/13 [phone]

## 2013-02-14 NOTE — ED Notes (Signed)
Dr Fonnie Jarvis at bedside; pt states she was seen at [facility] on Wednesday for arm injury; pt and family states she has had difficulty taking care of her self at home; family states the splint has moved from originally position; pt states arm is numb after injury and c/o pain in arm; pt and family states pain medication given was making her itch.

## 2013-02-14 NOTE — ED Notes (Signed)
Pt alert and mentating appropriately; pt family states they feel like pt is more alert than she was previously after getting some IV fluids; pt and family notified that pt would be admitted

## 2013-02-14 NOTE — ED Notes (Signed)
Per family, since fall, pt has gotten progressively weaker and confused; per Dr Jonn Shingles assessment, pt unable to hold legs up against gravity. Pt alert with family at bedside

## 2013-02-14 NOTE — ED Notes (Signed)
Pt returned from Xray and placed back on monitor.

## 2013-02-15 ENCOUNTER — Inpatient Hospital Stay (HOSPITAL_COMMUNITY): Payer: Medicare Other

## 2013-02-15 DIAGNOSIS — K7460 Unspecified cirrhosis of liver: Secondary | ICD-10-CM

## 2013-02-15 DIAGNOSIS — G9340 Encephalopathy, unspecified: Secondary | ICD-10-CM

## 2013-02-15 DIAGNOSIS — R4182 Altered mental status, unspecified: Secondary | ICD-10-CM

## 2013-02-15 LAB — COMPREHENSIVE METABOLIC PANEL
ALT: 7 U/L (ref 0–35)
BUN: 21 mg/dL (ref 6–23)
CO2: 23 mEq/L (ref 19–32)
Calcium: 8.3 mg/dL — ABNORMAL LOW (ref 8.4–10.5)
GFR calc Af Amer: 76 mL/min — ABNORMAL LOW (ref >90)
GFR calc non Af Amer: 66 mL/min — ABNORMAL LOW (ref >90)
Glucose, Bld: 206 mg/dL — ABNORMAL HIGH (ref 70–99)
Total Protein: 5.3 g/dL — ABNORMAL LOW (ref 6.0–8.3)

## 2013-02-15 LAB — CBC
HCT: 32.4 % — ABNORMAL LOW (ref 36.0–46.0)
Hemoglobin: 11.5 g/dL — ABNORMAL LOW (ref 12.0–15.0)
MCHC: 35.5 g/dL (ref 30.0–36.0)
RDW: 14.6 % (ref 11.5–15.5)
WBC: 7.1 10*3/uL (ref 4.0–10.5)

## 2013-02-15 LAB — GLUCOSE, CAPILLARY
Glucose-Capillary: 308 mg/dL — ABNORMAL HIGH (ref 70–99)
Glucose-Capillary: 290 mg/dL — ABNORMAL HIGH (ref 70–99)
Glucose-Capillary: 200 mg/dL — ABNORMAL HIGH (ref 70–99)

## 2013-02-15 MED ORDER — FLUTICASONE PROPIONATE 50 MCG/ACT NA SUSP
1.0000 | Freq: Every day | NASAL | Status: DC
  Administered 2013-02-15 – 2013-02-19 (×4): 1 via NASAL
  Filled 2013-02-15: qty 16

## 2013-02-15 MED ORDER — LACTULOSE 10 GM/15ML PO SOLN
30.0000 g | Freq: Two times a day (BID) | ORAL | Status: DC
  Administered 2013-02-15 – 2013-02-19 (×6): 30 g via ORAL
  Filled 2013-02-15 (×11): qty 45

## 2013-02-15 MED ORDER — BISACODYL 10 MG RE SUPP
10.0000 mg | Freq: Every day | RECTAL | Status: DC | PRN
  Administered 2013-02-15: 10 mg via RECTAL
  Filled 2013-02-15: qty 1

## 2013-02-15 MED ORDER — DOCUSATE SODIUM 100 MG PO CAPS
100.0000 mg | ORAL_CAPSULE | Freq: Every day | ORAL | Status: DC
  Administered 2013-02-15 – 2013-02-19 (×4): 100 mg via ORAL
  Filled 2013-02-15 (×5): qty 1

## 2013-02-15 NOTE — Progress Notes (Signed)
Pt continues to have dizziness when up. She is very unsteady when moving to [facility]. Requires two person assist. Her urine output remains low and urine is dark amber color with a strong urine odor noted. She is drinking po's well when fluids are offered to her. Remains alert and oriented.

## 2013-02-15 NOTE — Progress Notes (Signed)
Utilization Review Completed.   Blair Lundeen, RN, BSN Nurse Case Manager  [phone]  

## 2013-02-15 NOTE — Evaluation (Signed)
Occupational Therapy Evaluation Patient Details Name: [name] [name] MRN: [id number] DOB: Jul 10, 1941 Today's Date: 02/15/2013 Time: 1535-1600 OT Time Calculation (min): 25 min  OT Assessment / Plan / Recommendation Clinical Impression  Pt admitted with acute encephalopathy. Sustained Right humeral fx several days ago s/p fall. Pt with significant dizziness with all mobility (recommending PT/OT vestibular order).  Will benefit from acute OT services to address below problem list. Recommending SNF for d/c planning.    OT Assessment  Patient needs continued OT Services    Follow Up Recommendations  SNF    Barriers to Discharge      Equipment Recommendations  3 in 1 bedside comode    Recommendations for Other Services  PT/OT vestibular order  Frequency  Min 2X/week    Precautions / Restrictions Precautions Precautions: Fall Required Braces or Orthoses: Other Brace/Splint Other Brace/Splint: right shoulder sling Restrictions Weight Bearing Restrictions: Yes Other Position/Activity Restrictions: RUE NWB until further clarified from MD   Pertinent Vitals/Pain See vitals    ADL  Grooming: Performed;Moderate assistance Where Assessed - Grooming: Supported sitting Upper Body Bathing: Simulated;Moderate assistance Where Assessed - Upper Body Bathing: Supported sitting Lower Body Bathing: Simulated;Maximal assistance Where Assessed - Lower Body Bathing: Supported sitting Upper Body Dressing: Simulated;+1 Total assistance Where Assessed - Upper Body Dressing: Supported sitting Lower Body Dressing: Performed;+1 Total assistance Where Assessed - Lower Body Dressing: Supported sitting Toilet Transfer: Simulated;+2 Total assistance Toilet Transfer: Patient Percentage: 50% Statistician Method: Surveyor, minerals:  (bed-chair) Equipment Used: Gait belt Transfers/Ambulation Related to ADLs: +2 assist for SPT from bed to chair. Pt with heavy posterior lean and  significantly limited by dizziness (has nystagmus).  Assist for steadying and step by step VCs for sequencing and posture. ADL Comments: Pt limited by dizziness and right shoulder pain.  Total assist to adjust R shoulder sling.     OT Diagnosis: Generalized weakness;Disturbance of vision;Cognitive deficits;Acute pain  OT Problem List: Decreased strength;Decreased range of motion;Decreased activity tolerance;Impaired balance (sitting and/or standing);Impaired vision/perception;Decreased knowledge of use of DME or AE;Decreased knowledge of precautions;Obesity;Impaired UE functional use;Pain;Decreased cognition OT Treatment Interventions: Self-care/ADL training;DME and/or AE instruction;Therapeutic activities;Cognitive remediation/compensation;Visual/perceptual remediation/compensation;Patient/family education;Balance training   OT Goals Acute Rehab OT Goals OT Goal Formulation: With patient Time For Goal Achievement: 03/01/13 Potential to Achieve Goals: Good ADL Goals Pt Will Perform Grooming: with min assist;Unsupported;Sitting, edge of bed ADL Goal: Grooming - Progress: Goal set today Pt Will Transfer to Toilet: with max assist;Stand pivot transfer;3-in-1 ADL Goal: Toilet Transfer - Progress: Goal set today Miscellaneous OT Goals Miscellaneous OT Goal #1: Pt will independently use compensatory strategies during functional mobility to assist in minimizing dizziness. OT Goal: Miscellaneous Goal #1 - Progress: Goal set today Miscellaneous OT Goal #2: Pt will demonstrate correct positioning of Right shoulder sling with min assist and caregiver independent in assisting. OT Goal: Miscellaneous Goal #2 - Progress: Goal set today Miscellaneous OT Goal #3: Pt will perform static sitting balance task EOB >10 min with supervision as precursor for ADL retraining. OT Goal: Miscellaneous Goal #3 - Progress: Goal set today  Visit Information  Last OT Received On: 02/15/13 Assistance Needed: +2 PT/OT  Co-Evaluation/Treatment: Yes    Subjective Data      Prior Functioning     Home Living Lives With: Spouse Available Help at Discharge: Family;Available 24 hours/day Type of Home: House Home Access: Stairs to enter [employer] of Steps: 4 Entrance Stairs-Rails: Right;Left;Can reach both Home Layout: One level Bathroom Shower/Tub: Tub/shower unit  Bathroom Toilet: Standard Bathroom Accessibility: Yes How Accessible: Accessible via walker Home Adaptive Equipment: Walker - standard;Wheelchair - manual;Bedside commode/3-in-1 Prior Function Level of Independence: Independent Driving: No Comments: Pt fell at home 4 days ago (sustaining humeral fx) and has required assist from family since. Communication Communication: No difficulties Dominant Hand: Right         Vision/Perception Vision - History Patient Visual Report: Blurring of vision;Nausea/blurring vision with head movement Vision - Assessment Additional Comments: pt with nystagmus while sitting EOB and with mobility.   Cognition  Cognition Overall Cognitive Status: Impaired Area of Impairment: Memory Arousal/Alertness: Awake/alert Orientation Level: Appears intact for tasks assessed Behavior During Session: [facility] for tasks performed Memory Deficits: difficulty recalling home setup, family having to correct pt's answers (possibly due to pain meds?)    Extremity/Trunk Assessment Right Upper Extremity Assessment RUE ROM/Strength/Tone: Deficits;Unable to fully assess;Due to pain;Due to precautions RUE ROM/Strength/Tone Deficits: shoulder and elbow not tested due to pain and humeral fx.  digit and wrist ROM WFL. Will await clarification from MD before performing any shoulder ROM. RUE Sensation: Deficits RUE Sensation Deficits: numbness Left Upper Extremity Assessment LUE ROM/Strength/Tone: WFL for tasks assessed     Mobility Bed Mobility Bed Mobility: Supine to Sit;Sitting - Scoot to Edge of Bed Supine to  Sit: 1: +2 Total assist Supine to Sit: Patient Percentage: 50% Sitting - Scoot to Edge of Bed: 2: Max assist Details for Bed Mobility Assistance: Assist to support trunk OOB. Transfers Transfers: Sit to Stand;Stand to Sit Sit to Stand: 1: +2 Total assist;From bed Sit to Stand: Patient Percentage: 50% Stand to Sit: 1: +2 Total assist;To chair/3-in-1 Stand to Sit: Patient Percentage: 50% Details for Transfer Assistance: Assist for steadying and balance due to heavy posterior lean.  Incr dizziness upon standing. VCs to stick trunk forward and bottom out to assist with balance.      Exercise     Balance Balance Balance Assessed: Yes Static Sitting Balance Static Sitting - Balance Support: Left upper extremity supported;Feet supported Static Sitting - Level of Assistance: 4: Min assist Static Sitting - Comment/# of Minutes: Pt with anterior/posterior sway due to dizziness. Sat EOB ~5 min.   End of Session OT - End of Session Equipment Utilized During Treatment: Gait belt Activity Tolerance: Patient limited by pain;Other (comment) (limited by dizziness) Patient left: in chair;with call bell/phone within reach;with family/visitor present Nurse Communication: Mobility status  GO    02/15/2013 Cipriano Mile OTR/L Pager [phone] Office [phone]  Cipriano Mile 02/15/2013, 5:02 PM

## 2013-02-15 NOTE — Consult Note (Signed)
Reason for Consult: Right humerus fracture Referring Physician: Loletta Specter is an 72 y.o. female.  HPI: Larey Seat seen by Dr. Romeo Apple splinted taken off in ER. Scheduled to see Dr. Carola Frost tomorrow.  Past Medical History  Diagnosis Date  . CAD in native artery   . Thrombocytopenia, unspecified   . Unspecified hereditary and idiopathic peripheral neuropathy   . Unspecified fall   . Other and unspecified hyperlipidemia   . Unspecified essential hypertension   . Obesity, unspecified   . Shortness of breath   . Personal history of neurosis   . Intrinsic asthma, unspecified   . Allergic rhinitis, cause unspecified   . Cirrhosis of liver     NASH, afp on 06/17/12 =3.7, per pt she had hep B vaccines in 1996  . Colon adenomas 03/08/10    tcs by Dr. Jena Gauss  . Hyperplastic polyps of stomach 03/08/10    tcs by Dr. Geni Bers  . Chronic gastritis 03/09/11    egd by Dr. Jena Gauss  . History of hemorrhoids 03/08/10    tcs- internal and external  . Diverticula of colon 03/08/10    L side  . Hiatal hernia 03/08/10  . Esophagitis, erosive 03/08/10  . GERD (gastroesophageal reflux disease)   . Wears glasses   . Type II or unspecified type diabetes mellitus without mention of complication, not stated as uncontrolled   . Vertigo     Past Surgical History  Procedure Laterality Date  . Hysterectomy and btl      s/p  . Abdominal hysterectomy    . Tumor excision  2003    rt arm and left foot  . Colonoscopy  03/08/10    Dr. [name]-->ext/int hemorrhoids, anal paipilla, rectal polyps, desc polyps, cecal polyp, left-sided diverticula. suboptiomal prep. next TCS 02/2015. multiple adenomas  . Esophagogastroduodenoscopy  03/08/10    Dr. Dellie Catholic erosive RE, small hh, antral erosions, small 1cm are of mucosal indentation along gastric body of doubtful significance, cystic nodularity of hypophyarynx, base of tongue, base of epiglottis. benign gastric biopsies  . Tubal ligation    . Esophagogastroduodenoscopy   10/08/2011    Dr. Dionisio David esophageal varices, antral erosion. next egd 09/2013  . Foot surgery  2003    lt foot  . Breast surgery    . Eye surgery      cataracts    Family History  Problem Relation Age of Onset  . Coronary artery disease      FH  . Diabetes      FH  . Heart disease Mother   . Colon cancer Neg Hx     Social History:  reports that she has never smoked. She does not have any smokeless tobacco history on file. She reports that she does not drink alcohol or use illicit drugs.  Allergies:  Allergies  Allergen Reactions  . Prochlorperazine Edisylate Other (See Comments)    Causes anxiety/numbness  . Compazine (Prochlorperazine Edisylate) Anxiety    Causes anxiety & nervousness    Medications: reviewed  Results for orders placed during the hospital encounter of 02/14/13 (from the past 48 hour(s))  URINALYSIS, ROUTINE W REFLEX MICROSCOPIC     Status: Abnormal   Collection Time    02/14/13 11:56 AM      Result Value Range   Color, Urine AMBER (*) YELLOW   Comment: BIOCHEMICALS MAY BE AFFECTED BY COLOR   APPearance TURBID (*) CLEAR   Specific Gravity, Urine 1.039 (*) 1.005 - 1.030   pH 5.5  5.0 - 8.0   Glucose, UA >1000 (*) NEGATIVE mg/dL   Hgb urine dipstick LARGE (*) NEGATIVE   Bilirubin Urine SMALL (*) NEGATIVE   Ketones, ur 15 (*) NEGATIVE mg/dL   Protein, ur 30 (*) NEGATIVE mg/dL   Urobilinogen, UA 1.0  0.0 - 1.0 mg/dL   Nitrite POSITIVE (*) NEGATIVE   Leukocytes, UA MODERATE (*) NEGATIVE  URINE MICROSCOPIC-ADD ON     Status: Abnormal   Collection Time    02/14/13 11:56 AM      Result Value Range   Squamous Epithelial / LPF RARE  RARE   WBC, UA TOO NUMEROUS TO COUNT  <3 WBC/hpf   RBC / HPF 21-50  <3 RBC/hpf   Bacteria, UA MANY (*) RARE   Urine-Other RARE YEAST    URINE CULTURE     Status: None   Collection Time    02/14/13 11:56 AM      Result Value Range   Specimen Description URINE, CLEAN CATCH     Special Requests NONE     Culture  Setup  Time 02/14/2013 22:05     Colony Count >=100,000 COLONIES/ML     Culture ESCHERICHIA COLI     Report Status PENDING    CBC WITH DIFFERENTIAL     Status: Abnormal   Collection Time    02/14/13 12:00 PM      Result Value Range   WBC 12.8 (*) 4.0 - 10.5 K/uL   RBC 4.15  3.87 - 5.11 MIL/uL   Hemoglobin 12.2  12.0 - 15.0 g/dL   HCT 16.1 (*) 09.6 - 04.5 %   MCV 81.4  78.0 - 100.0 fL   MCH 29.4  26.0 - 34.0 pg   MCHC 36.1 (*) 30.0 - 36.0 g/dL   RDW 40.9  81.1 - 91.4 %   Platelets 145 (*) 150 - 400 K/uL   Neutrophils Relative 74  43 - 77 %   Neutro Abs 9.5 (*) 1.7 - 7.7 K/uL   Lymphocytes Relative 15  12 - 46 %   Lymphs Abs 1.9  0.7 - 4.0 K/uL   Monocytes Relative 10  3 - 12 %   Monocytes Absolute 1.3 (*) 0.1 - 1.0 K/uL   Eosinophils Relative 1  0 - 5 %   Eosinophils Absolute 0.1  0.0 - 0.7 K/uL   Basophils Relative 0  0 - 1 %   Basophils Absolute 0.1  0.0 - 0.1 K/uL  COMPREHENSIVE METABOLIC PANEL     Status: Abnormal   Collection Time    02/14/13 12:00 PM      Result Value Range   Sodium 133 (*) 135 - 145 mEq/L   Potassium 3.3 (*) 3.5 - 5.1 mEq/L   Chloride 100  96 - 112 mEq/L   CO2 22  19 - 32 mEq/L   Glucose, Bld 309 (*) 70 - 99 mg/dL   BUN 14  6 - 23 mg/dL   Creatinine, Ser 7.82  0.50 - 1.10 mg/dL   Calcium 8.2 (*) 8.4 - 10.5 mg/dL   Total Protein 5.7 (*) 6.0 - 8.3 g/dL   Albumin 3.0 (*) 3.5 - 5.2 g/dL   AST 20  0 - 37 U/L   ALT 9  0 - 35 U/L   Alkaline Phosphatase 74  39 - 117 U/L   Total Bilirubin 3.6 (*) 0.3 - 1.2 mg/dL   GFR calc non Af Amer 86 (*) >90 mL/min   GFR calc Af Amer >90  >90  mL/min   Comment:            The eGFR has been calculated     using the CKD EPI equation.     This calculation has not been     validated in all clinical     situations.     eGFR's persistently     <90 mL/min signify     possible Chronic Kidney Disease.  PROTIME-INR     Status: None   Collection Time    02/14/13 12:00 PM      Result Value Range   Prothrombin Time 14.6  11.6  - 15.2 seconds   INR 1.16  0.00 - 1.49  MAGNESIUM     Status: None   Collection Time    02/14/13 12:00 PM      Result Value Range   Magnesium 2.1  1.5 - 2.5 mg/dL   Comment: HEMOLYSIS AT THIS LEVEL MAY AFFECT RESULT  HEMOGLOBIN A1C     Status: Abnormal   Collection Time    02/14/13 12:00 PM      Result Value Range   Hemoglobin A1C 8.1 (*) <5.7 %   Comment: (NOTE)                                                                               According to the ADA Clinical Practice Recommendations for 2011, when     HbA1c is used as a screening test:      >=6.5%   Diagnostic of Diabetes Mellitus               (if abnormal result is confirmed)     5.7-6.4%   Increased risk of developing Diabetes Mellitus     References:Diagnosis and Classification of Diabetes Mellitus,Diabetes     Care,2011,34(Suppl 1):S62-S69 and Standards of Medical Care in             Diabetes - 2011,Diabetes Care,2011,34 (Suppl 1):S11-S61.   Mean Plasma Glucose 186 (*) <117 mg/dL  GLUCOSE, CAPILLARY     Status: Abnormal   Collection Time    02/14/13 12:17 PM      Result Value Range   Glucose-Capillary 289 (*) 70 - 99 mg/dL  AMMONIA     Status: None   Collection Time    02/14/13  3:05 PM      Result Value Range   Ammonia 45  11 - 60 umol/L  GLUCOSE, CAPILLARY     Status: Abnormal   Collection Time    02/14/13  4:26 PM      Result Value Range   Glucose-Capillary 308 (*) 70 - 99 mg/dL  TSH     Status: None   Collection Time    02/14/13  6:05 PM      Result Value Range   TSH 1.288  0.350 - 4.500 uIU/mL  GLUCOSE, CAPILLARY     Status: Abnormal   Collection Time    02/14/13 10:15 PM      Result Value Range   Glucose-Capillary 305 (*) 70 - 99 mg/dL  GLUCOSE, CAPILLARY     Status: Abnormal   Collection Time    02/15/13  6:43 AM      Result  Value Range   Glucose-Capillary 199 (*) 70 - 99 mg/dL  COMPREHENSIVE METABOLIC PANEL     Status: Abnormal   Collection Time    02/15/13  6:44 AM      Result Value  Range   Sodium 135  135 - 145 mEq/L   Potassium 4.2  3.5 - 5.1 mEq/L   Chloride 103  96 - 112 mEq/L   CO2 23  19 - 32 mEq/L   Glucose, Bld 206 (*) 70 - 99 mg/dL   BUN 21  6 - 23 mg/dL   Creatinine, Ser 4.09  0.50 - 1.10 mg/dL   Calcium 8.3 (*) 8.4 - 10.5 mg/dL   Total Protein 5.3 (*) 6.0 - 8.3 g/dL   Albumin 2.6 (*) 3.5 - 5.2 g/dL   AST 17  0 - 37 U/L   ALT 7  0 - 35 U/L   Alkaline Phosphatase 69  39 - 117 U/L   Total Bilirubin 2.4 (*) 0.3 - 1.2 mg/dL   GFR calc non Af Amer 66 (*) >90 mL/min   GFR calc Af Amer 76 (*) >90 mL/min   Comment:            The eGFR has been calculated     using the CKD EPI equation.     This calculation has not been     validated in all clinical     situations.     eGFR's persistently     <90 mL/min signify     possible Chronic Kidney Disease.  CBC     Status: Abnormal   Collection Time    02/15/13  6:44 AM      Result Value Range   WBC 7.1  4.0 - 10.5 K/uL   RBC 3.98  3.87 - 5.11 MIL/uL   Hemoglobin 11.5 (*) 12.0 - 15.0 g/dL   HCT 81.1 (*) 91.4 - 78.2 %   MCV 81.4  78.0 - 100.0 fL   MCH 28.9  26.0 - 34.0 pg   MCHC 35.5  30.0 - 36.0 g/dL   RDW 95.6  21.3 - 08.6 %   Platelets PLATELETS APPEAR ADEQUATE  150 - 400 K/uL   Comment: REPEATED TO VERIFY     SPECIMEN CHECKED FOR CLOTS  GLUCOSE, CAPILLARY     Status: Abnormal   Collection Time    02/15/13 11:28 AM      Result Value Range   Glucose-Capillary 290 (*) 70 - 99 mg/dL   Comment 1 Notify RN    GLUCOSE, CAPILLARY     Status: Abnormal   Collection Time    02/15/13  4:06 PM      Result Value Range   Glucose-Capillary 200 (*) 70 - 99 mg/dL    Dg Chest 1 View  03/18/8468  *RADIOLOGY REPORT*  Clinical Data: Weakness and recent right humeral fracture.  CHEST - 1 VIEW  Comparison: 11/28/2012  Findings: Lung volumes are low and the heart is moderately enlarged.  There is some prominence of pulmonary vascularity without overt edema.  No pleural fluid or pneumothorax.  IMPRESSION: Low lung volumes  and cardiomegaly.   Original Report Authenticated By: Irish Lack, M.D.    Dg Humerus Right  02/14/2013  *RADIOLOGY REPORT*  Clinical Data: Increased pain at site of recent humeral fracture.  RIGHT HUMERUS - 2+ VIEW  Comparison: 02/11/2013  Findings: The proximal humeral shaft fracture demonstrates similar degree of displacement and angulation since the prior study with maximal displacement of approximately a full  shaft width.  No associated dislocation.  Stable soft tissue prominence.  IMPRESSION: Similar displacement and angulation at the level of the proximal shaft fracture of the right humerus.   Original Report Authenticated By: Irish Lack, M.D.     Review of Systems  Musculoskeletal: Positive for joint pain.  All other systems reviewed and are negative.   Blood pressure 115/53, pulse 94, temperature 99.5 F (37.5 C), temperature source Oral, resp. rate 18, height 5\' 8"  (1.727 m), weight 114 kg (251 lb 5.2 oz), SpO2 98.00%. Physical Exam  Constitutional: She is oriented to person, place, and time. She appears well-developed.  HENT:  Head: Normocephalic.  Eyes: Pupils are equal, round, and reactive to light.  Neck: Normal range of motion.  Cardiovascular: Normal rate.   Respiratory: Effort normal.  GI: Soft.  Musculoskeletal:  Right Arm splint intact. NVI.   Neurological: She is alert and oriented to person, place, and time.  Skin: Skin is warm and dry.  Psychiatric: She has a normal mood and affect.    Assessment/Plan: Right proximal humerus fracture. Plan Closed reduction splinting. NV checks. Dr. Carola Frost to see tomorrow. Will probably need ORIF humerus. XRAY following splinting pending.  Lucky Alverson C 02/15/2013, 7:14 PM

## 2013-02-15 NOTE — Evaluation (Signed)
Physical Therapy Evaluation Patient Details Name: [name] [name] MRN: [id number] DOB: 1941-02-06 Today's Date: 02/15/2013 Time: 1534-1600 PT Time Calculation (min): 26 min  PT Assessment / Plan / Recommendation Clinical Impression  Pt is a 72 yo female who recently fell and now has a R humerus fracture in conjuction with severe vestibular deficits inhibiting safe OOB mobiltiy at this time. Recommending Vestibular PT/OT consult in acute setting.  Pt to strongly benefit from vestibular therapy and SNF placement upon d/c to achieve safe mod I function prior to transition home.    PT Assessment  Patient needs continued PT services    Follow Up Recommendations  SNF;Supervision/Assistance - 24 hour    Does the patient have the potential to tolerate intense rehabilitation      Barriers to Discharge Decreased caregiver support      Equipment Recommendations  None recommended by PT    Recommendations for Other Services     Frequency Min 4X/week    Precautions / Restrictions Precautions Precautions: Fall Required Braces or Orthoses: Other Brace/Splint Other Brace/Splint: right shoulder sling Restrictions Weight Bearing Restrictions: Yes Other Position/Activity Restrictions: RUE NWB until further clarified from MD   Pertinent Vitals/Pain Pt did not rate pain, but c/o R shoulder/arm pain      Mobility  Bed Mobility Bed Mobility: Supine to Sit;Sitting - Scoot to Edge of Bed Supine to Sit: 1: +2 Total assist Supine to Sit: Patient Percentage: 50% Sitting - Scoot to Edge of Bed: 2: Max assist Details for Bed Mobility Assistance: Assist to support trunk OOB. Transfers Sit to Stand: 1: +2 Total assist;From bed Sit to Stand: Patient Percentage: 50% Stand to Sit: 1: +2 Total assist;To chair/3-in-1 Stand to Sit: Patient Percentage: 50% Details for Transfer Assistance: Assist for steadying and balance due to heavy posterior lean.  Incr dizziness upon standing. VCs to stick trunk forward  and bottom out to assist with balance.  Ambulation/Gait Ambulation/Gait Assistance: Not tested (comment) (only able to take 4 steps to chair) Assistive device: 2 person hand held assist Ambulation/Gait Assistance Details: pt with severe dizziness causing pt to have strong posterior lean requiring max verbal cues to bring shlds fwd and increase bilat hip flex for safe transfer. General Gait Details: pt with noted vestibular issues inhibiting safe ambulation at this time Stairs: No    Exercises     PT Diagnosis: Difficulty walking  PT Problem List: Decreased strength;Decreased balance;Decreased mobility (vestibular issures) PT Treatment Interventions: Gait training;Therapeutic activities;Therapeutic exercise;Balance training (vestibular therapy)   PT Goals Acute Rehab PT Goals PT Goal Formulation: With patient Time For Goal Achievement: 03/01/13 Potential to Achieve Goals: Good Pt will go Supine/Side to Sit: with min assist;with HOB 0 degrees;with rail PT Goal: Supine/Side to Sit - Progress: Goal set today Pt will go Sit to Supine/Side: with min assist;with HOB 0 degrees;with rail PT Goal: Sit to Supine/Side - Progress: Goal set today Pt will go Sit to Stand: with mod assist;with upper extremity assist PT Goal: Sit to Stand - Progress: Goal set today Pt will Transfer Bed to Chair/Chair to Bed: with mod assist PT Transfer Goal: Bed to Chair/Chair to Bed - Progress: Goal set today Pt will Ambulate: 16 - 50 feet;with mod assist;with least restrictive assistive device PT Goal: Ambulate - Progress: Goal set today Additional Goals Additional Goal #1: Pt to maintain standing x 1 min with minAx1 without posterior lean.  Visit Information  Last PT Received On: 02/15/13 Assistance Needed: +2 PT/OT Co-Evaluation/Treatment: Yes    Subjective  Data      Prior Functioning  Home Living Lives With: Spouse Available Help at Discharge: Family;Available 24 hours/day Type of Home: House Home  Access: Stairs to enter [employer] of Steps: 4 Entrance Stairs-Rails: Right;Left;Can reach both Home Layout: One level Bathroom Shower/Tub: Engineer, manufacturing systems: Standard Bathroom Accessibility: Yes How Accessible: Accessible via walker Home Adaptive Equipment: Walker - standard;Wheelchair - manual;Bedside commode/3-in-1 Prior Function Level of Independence: Independent Driving: No Comments: Pt fell at home 4 days ago (sustaining humeral fx) and has required assist from family since. Communication Communication: No difficulties Dominant Hand: Right    Cognition  Cognition Overall Cognitive Status: Impaired Area of Impairment: Memory Arousal/Alertness: Awake/alert Orientation Level: Appears intact for tasks assessed Behavior During Session: [facility] for tasks performed Memory Deficits: difficulty recalling home setup, family having to correct pt's answers (possibly due to pain meds?)    Extremity/Trunk Assessment Right Upper Extremity Assessment RUE ROM/Strength/Tone: Deficits;Unable to fully assess;Due to pain;Due to precautions RUE ROM/Strength/Tone Deficits: shoulder and elbow not tested due to pain and humeral fx.  digit and wrist ROM WFL. Will await clarification from MD before performing any shoulder ROM. RUE Sensation: Deficits RUE Sensation Deficits: numbness Left Upper Extremity Assessment LUE ROM/Strength/Tone: WFL for tasks assessed Right Lower Extremity Assessment RLE ROM/Strength/Tone: WFL for tasks assessed Left Lower Extremity Assessment LLE ROM/Strength/Tone: WFL for tasks assessed Trunk Assessment Trunk Assessment: Normal   Balance Balance Balance Assessed: Yes Static Sitting Balance Static Sitting - Balance Support: Left upper extremity supported;Feet supported Static Sitting - Level of Assistance: 4: Min assist Static Sitting - Comment/# of Minutes: Pt with anterior/posterior sway due to dizziness. Sat EOB ~5 min.  End of Session PT -  End of Session Equipment Utilized During Treatment: Gait belt Activity Tolerance:  (limited by dizziness) Patient left: in chair;with call bell/phone within reach;with family/visitor present Nurse Communication: Mobility status (vestibular issues)  GP     Marcene Brawn 02/15/2013, 5:09 PM  Lewis Shock, PT, DPT Pager #: [phone] Office #: [phone]

## 2013-02-15 NOTE — Progress Notes (Addendum)
[facility] PROGRESS NOTE  [name] [name] JYN:[id number] DOB: [birth year] DOA: 02/14/2013 PCP: Colette Ribas, MD  Assessment/Plan: 1. Encephalopathy: Improved, probably related to UTI.  2. UTI: Continue with Ceftriaxone. Follow urine culture.    3. Hyponatremia: resolved with IV fluids.  4. Hypokalemia: Resolved. 5. Nash Cirrhosis: Bilirubin mildly increase. Repeat in am. Unlikely SBP.  6. Diabetes: Continue with Linagliptin, Levemir, novolog.  7. Constipation: increase lactulose.   Code Status: Full  Family Communication: none at bedside.  Disposition Plan: expect home in 24 to 48 hours.    Consultants:  None  Procedures:  none  Antibiotics: Ceftriaxone 4-5  HPI/Subjective: Feeling ok, was eating breakfast. No BM for last couple of days. Abdominal pain is better. She is oriented to time, place.   Objective: Filed Vitals:   02/14/13 2219 02/15/13 0500 02/15/13 0546 02/15/13 0810  BP: 113/47  126/52   Pulse: 85  82   Temp: 98.4 F (36.9 C)  99.4 F (37.4 C)   TempSrc:      Resp: 16  16   Height:      Weight:  114 kg (251 lb 5.2 oz)    SpO2: 99%  100% 100%    Intake/Output Summary (Last 24 hours) at 02/15/13 1308 Last data filed at 02/15/13 0825  Gross per 24 hour  Intake   1504 ml  Output     50 ml  Net   1454 ml   Filed Weights   02/14/13 1700 02/15/13 0500  Weight: 108.863 kg (240 lb) 114 kg (251 lb 5.2 oz)    Exam:   General:  Awake, in no distress.   Cardiovascular: S 1, S 2 RRR  Respiratory: CTA  Abdomen: Bs present, soft.  Musculoskeletal: trace edema.   Data Reviewed: Basic Metabolic Panel:  Recent Labs Lab 02/14/13 1200 02/15/13 0644  NA 133* 135  K 3.3* 4.2  CL 100 103  CO2 22 23  GLUCOSE 309* 206*  BUN 14 21  CREATININE 0.70 0.88  CALCIUM 8.2* 8.3*  MG 2.1  --    Liver Function Tests:  Recent Labs Lab 02/14/13 1200 02/15/13 0644  AST 20 17  ALT 9 7  ALKPHOS 74 69  BILITOT 3.6* 2.4*  PROT 5.7* 5.3*   ALBUMIN 3.0* 2.6*   No results found for this basename: LIPASE, AMYLASE,  in the last 168 hours  Recent Labs Lab 02/14/13 1505  AMMONIA 45   CBC:  Recent Labs Lab 02/14/13 1200 02/15/13 0644  WBC 12.8* 7.1  NEUTROABS 9.5*  --   HGB 12.2 11.5*  HCT 33.8* 32.4*  MCV 81.4 81.4  PLT 145* PLATELETS APPEAR ADEQUATE   Cardiac Enzymes: No results found for this basename: CKTOTAL, CKMB, CKMBINDEX, TROPONINI,  in the last 168 hours BNP (last 3 results) No results found for this basename: PROBNP,  in the last 8760 hours CBG:  Recent Labs Lab 02/14/13 1217  GLUCAP 289*    No results found for this or any previous visit (from the past 240 hour(s)).   Studies: Dg Chest 1 View  02/14/2013  *RADIOLOGY REPORT*  Clinical Data: Weakness and recent right humeral fracture.  CHEST - 1 VIEW  Comparison: 11/28/2012  Findings: Lung volumes are low and the heart is moderately enlarged.  There is some prominence of pulmonary vascularity without overt edema.  No pleural fluid or pneumothorax.  IMPRESSION: Low lung volumes and cardiomegaly.   Original Report Authenticated By: Irish Lack, M.D.    Dg Humerus  Right  02/14/2013  *RADIOLOGY REPORT*  Clinical Data: Increased pain at site of recent humeral fracture.  RIGHT HUMERUS - 2+ VIEW  Comparison: 02/11/2013  Findings: The proximal humeral shaft fracture demonstrates similar degree of displacement and angulation since the prior study with maximal displacement of approximately a full shaft width.  No associated dislocation.  Stable soft tissue prominence.  IMPRESSION: Similar displacement and angulation at the level of the proximal shaft fracture of the right humerus.   Original Report Authenticated By: Irish Lack, M.D.     Scheduled Meds: . aspirin EC  81 mg Oral Daily  . budesonide-formoterol  2 puff Inhalation BID  . cefTRIAXone (ROCEPHIN)  IV  1 g Intravenous q morning - 10a  . clotrimazole   Topical BID  . docusate sodium  100 mg Oral  Daily  . enalapril  5 mg Oral Daily  . enoxaparin (LOVENOX) injection  40 mg Subcutaneous Q24H  . fluticasone  1 spray Each Nare Daily  . insulin aspart  0-20 Units Subcutaneous TID WC  . insulin aspart  0-5 Units Subcutaneous QHS  . insulin aspart  6 Units Subcutaneous TID WC  . insulin detemir  60 Units Subcutaneous QHS  . lactulose  30 g Oral BID  . linagliptin  5 mg Oral Daily  . pantoprazole  80 mg Oral Q1200  . pregabalin  100 mg Oral QHS  . senna  1 tablet Oral QHS   Continuous Infusions:   Principal Problem:   Encephalopathy acute Active Problems:   OBESITY   THROMBOCYTOPENIA, CHRONIC   CIRRHOSIS   Uncontrolled type 2 diabetes mellitus with complication   Peripheral neuropathy   Muscle weakness (generalized)   Fracture of humeral shaft, right, closed   UTI (urinary tract infection)   Hyponatremia   Hypokalemia    Time spent: 25 minutes.     REGALADO,BELKYS  [facility] Pager [phone]. If 7PM-7AM, please contact night-coverage at [email or web address], password [facility] 02/15/2013, 1:08 PM  LOS: 1 day    Right Humerus fracture: patient splint was removed by ED physician. I spoke with Dr Jillyn Hidden he will speak with  Ortho tech and give recommendation for splint. Dr Pilar Plate will be available tomorrow. I will consult him tomorrow.

## 2013-02-15 NOTE — Progress Notes (Signed)
Orthopedic Tech Progress Note Patient Details:  [name] [name] Apr 08, 1941 [id number]  Ortho Devices Type of Ortho Device: Ace wrap;Coapt;Sugartong splint Ortho Device/Splint Location: (R) UE Ortho Device/Splint Interventions: Ordered;Application   Jennye Moccasin 02/15/2013, 5:56 PM

## 2013-02-16 ENCOUNTER — Ambulatory Visit (HOSPITAL_COMMUNITY): Payer: Medicare Other | Admitting: *Deleted

## 2013-02-16 ENCOUNTER — Encounter (HOSPITAL_COMMUNITY): Payer: Self-pay | Admitting: Anesthesiology

## 2013-02-16 DIAGNOSIS — I517 Cardiomegaly: Secondary | ICD-10-CM

## 2013-02-16 DIAGNOSIS — D649 Anemia, unspecified: Secondary | ICD-10-CM

## 2013-02-16 LAB — COMPREHENSIVE METABOLIC PANEL
ALT: 7 U/L (ref 0–35)
AST: 15 U/L (ref 0–37)
CO2: 24 mEq/L (ref 19–32)
Calcium: 8.1 mg/dL — ABNORMAL LOW (ref 8.4–10.5)
GFR calc non Af Amer: 87 mL/min — ABNORMAL LOW (ref >90)
Potassium: 3.5 mEq/L (ref 3.5–5.1)
Sodium: 134 mEq/L — ABNORMAL LOW (ref 135–145)
Total Protein: 4.8 g/dL — ABNORMAL LOW (ref 6.0–8.3)

## 2013-02-16 LAB — URINE CULTURE

## 2013-02-16 LAB — CBC
MCH: 28.9 pg (ref 26.0–34.0)
Platelets: 116 10*3/uL — ABNORMAL LOW (ref 150–400)
RBC: 3.39 MIL/uL — ABNORMAL LOW (ref 3.87–5.11)
WBC: 5 10*3/uL (ref 4.0–10.5)

## 2013-02-16 LAB — GLUCOSE, CAPILLARY: Glucose-Capillary: 234 mg/dL — ABNORMAL HIGH (ref 70–99)

## 2013-02-16 MED ORDER — CEFAZOLIN SODIUM 1-5 GM-% IV SOLN
1.0000 g | Freq: Once | INTRAVENOUS | Status: DC
  Filled 2013-02-16 (×3): qty 50

## 2013-02-16 NOTE — Progress Notes (Addendum)
Physical Therapy Treatment/Vestibular Evaluation Patient Details Name: [name] [name] MRN: [id number] DOB: Nov 30, 1940 Today's Date: 02/16/2013 Time: 4098-1191 PT Time Calculation (min): 30 min  PT Assessment / Plan / Recommendation Comments on Treatment Session  Patient demonstrates more central cause of vertigo and imbalance with question h/o SDH or "concussion."  Potentially had peripheral problem initially and with frequent falls hit head creating central problem.  Will benefit from oculomotor exercises to improve smooth pursuits and saccades and from visual compensation for mobility as well as habituation for particular movements that aggravate her symptoms (as tolerated with right humeral fracture,) and continued balance and mobility training.  Agree with recommendation for SNF level rehab.    Follow Up Recommendations  SNF;Supervision/Assistance - 24 hour           Equipment Recommendations  None recommended by PT       Frequency Min 3X/week   Plan Discharge plan remains appropriate    Precautions / Restrictions Precautions Precautions: Fall Required Braces or Orthoses: Other Brace/Splint Other Brace/Splint: right shoulder sling, shoulder splint Restrictions RUE Weight Bearing: Non weight bearing   Pertinent Vitals/Pain C/o right UE pain with mobility    Mobility  Bed Mobility Bed Mobility: Rolling Left;Left Sidelying to Sit Rolling Left: With rail;4: Min assist Left Sidelying to Sit: 2: Max assist Sitting - Scoot to [place] of Bed: 3: Mod assist Details for Bed Mobility Assistance: assist to get legs off bed and lift trunk upright, pt pulling up with rail with left UE Transfers Sit to Stand: 1: +2 Total assist;From bed Sit to Stand: Patient Percentage: 60% Stand to Sit: 1: +2 Total assist Stand to Sit: Patient Percentage: 70% Details for Transfer Assistance: assist for balance and to increase confidence with cues for eyes on stationary  target. Ambulation/Gait Ambulation/Gait Assistance: 1: +2 Total assist Ambulation/Gait: Patient Percentage: 60% Ambulation Distance (Feet): 12 Feet Assistive device: 2 person hand held assist Ambulation/Gait Assistance Details: still dizzy, but tolerates with cues for stationary target Gait Pattern: Wide base of support;Decreased stride length;Decreased hip/knee flexion - left;Decreased hip/knee flexion - right    Vestibular Assessment   Supine positional testing: performed supine head roll Lt & Rt. and simulated dix hallpike with bed in trendelenberg.  C/o mild symptoms with simulated dix hallpike, but no testing demonstrated nystagmus or definate dizziness symptoms. Supine oculomotor assessment: smooth pursuits: demonstrates some saccadic movements with eyes to left mainly.  Has great difficulty with target maintenance due to compliance with onset of symptoms versus high distractibility.  Saccades demonstrated variety of changes including frog hopping and occasional overshooting.    Seated oculmotor assessment (con't): noted difficulty with vestibular ocular reflex (VOR) due to difficulty with target maintenance (better with me moving her head and constant cues to keep eyes on target.)  VOR cancellation with catch up saccades or nystagmus noted with leftward head movements.  Head thrust test positive bilateral for refixation saccade more iintense with left head movement.  Hearing intact to scratch test bilaterally; demonstrated slowed coordination with toe tapping and decreased coordination with heel to shin on rt more than lt.     PT Goals Acute Rehab PT Goals Pt will go Supine/Side to Sit: with min assist;with HOB 0 degrees;with rail PT Goal: Supine/Side to Sit - Progress: Progressing toward goal Pt will go Sit to Stand: with mod assist;with upper extremity assist PT Goal: Sit to Stand - Progress: Progressing toward goal Pt will Transfer Bed to Chair/Chair to Bed: with mod assist PT  Transfer Goal:  Bed to Chair/Chair to Bed - Progress: Progressing toward goal Pt will Ambulate: 16 - 50 feet;with mod assist;with least restrictive assistive device PT Goal: Ambulate - Progress: Progressing toward goal Additional Goals Additional Goal #1: Pt to maintain standing x 1 min with minAx1 without posterior lean. PT Goal: Additional Goal #1 - Progress: Progressing toward goal  Visit Information  Last PT Received On: 02/16/13    Subjective Data  Subjective: Reports having dizziness and balance trouble about 4 years.  Takes Meclizine at home; sometimes even dizzy just sitting still.  Describes as light headed and sometimes room spinning.  Has had several falls (maybe 4 in 6 months.)  Just started using walker after recent hospitalization. Reports no visual problems except uses readers.  Has had no difficulty with hearing.  Spouse states was checked out for ministrokes and found not to have, stated coming from her liver.  Reports sometimes falls due to dizziness and somtimes she trips.  Has hit her head before and had "concussion" per spouse.     Cognition  Cognition Overall Cognitive Status: Impaired Area of Impairment: Memory Arousal/Alertness: Awake/alert Orientation Level: Appears intact for tasks assessed Behavior During Session: [facility] for tasks performed    Balance  Static Sitting Balance Static Sitting - Balance Support: Bilateral upper extremity supported;Feet supported Static Sitting - Level of Assistance: 5: Stand by assistance Static Standing Balance Static Standing - Balance Support: Bilateral upper extremity supported Static Standing - Level of Assistance: 3: Mod assist Static Standing - Comment/# of Minutes: standing few seconds to allow decreased dizziness prior to ambulating in room  End of Session PT - End of Session Equipment Utilized During Treatment: Gait belt Activity Tolerance: Treatment limited secondary to medical complications (Comment) Patient left: in  chair;with family/visitor present   GP     [facility] 02/16/2013, 3:01 PM [place], PT [phone] 02/16/2013

## 2013-02-16 NOTE — Progress Notes (Signed)
Inpatient Diabetes Program Recommendations  AACE/ADA: New Consensus Statement on Inpatient Glycemic Control (2013)  Target Ranges:  Prepandial:   less than 140 mg/dL      Peak postprandial:   less than 180 mg/dL (1-2 hours)      Critically ill patients:  140 - 180 mg/dL  Results for UCHECHI, DENISON (MRN [id number]) as of 02/16/2013 10:21  Ref. Range 02/15/2013 06:43 02/15/2013 11:28 02/15/2013 16:06 02/15/2013 22:32 02/16/2013 06:30  Glucose-Capillary Latest Range: 70-99 mg/dL 409 (H) 811 (H) 914 (H) 302 (H) 243 (H)    Inpatient Diabetes Program Recommendations Insulin - Basal: Increase Levemir to 70 units  Thank you  Piedad Climes BSN, RN,CDE Inpatient Diabetes Coordinator [phone] (team pager)

## 2013-02-16 NOTE — Consult Note (Signed)
[facility] Consult Note  Referring Provider: No ref. provider found Primary Care Physician:  Colette Ribas, MD Primary Gastroenterologist:  Dr.  Antony Contras Complaint: Fractured right humerus HPI: [name] [name] is an 72 y.o. 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 She has other medical problems including asthma coronary artery disease, obesity and non-insulin-dependent diabetes mellitus. Her workup in the past has apparently been negative for any specific serologically identifiable causes of liver disease and she has never been a drinker. I.e. she has been told that she has Nash cirrhosis. She takes lactulose daily with generally good control of encephalopathy. On recent CT scan she has no ascites but does have a nodular liver and splenomegaly. Her child-Pugh score is 9 making her at the poor end of the range 4 child class B.  Past Medical History  Diagnosis Date  . CAD in native artery   . Thrombocytopenia, unspecified   . Unspecified hereditary and idiopathic peripheral neuropathy   . Unspecified fall   . Other and unspecified hyperlipidemia   . Unspecified essential hypertension   . Obesity, unspecified   . Shortness of breath   . Personal history of neurosis   . Intrinsic asthma, unspecified   . Allergic rhinitis, cause unspecified   . Cirrhosis of liver     NASH, afp on 06/17/12 =3.7, per pt she had hep B vaccines in 1996  . Colon adenomas 03/08/10    tcs by Dr. Jena Gauss  . Hyperplastic polyps of stomach 03/08/10    tcs by Dr. Geni Bers  . Chronic gastritis 03/09/11    egd by Dr. Jena Gauss  . History of hemorrhoids 03/08/10    tcs- internal and external  . Diverticula of colon 03/08/10    L side  . Hiatal hernia 03/08/10  . Esophagitis, erosive 03/08/10  . GERD (gastroesophageal reflux disease)    . Wears glasses   . Type II or unspecified type diabetes mellitus without mention of complication, not stated as uncontrolled   . Vertigo     Past Surgical History  Procedure Laterality Date  . Hysterectomy and btl      s/p  . Abdominal hysterectomy    . Tumor excision  2003    rt arm and left foot  . Colonoscopy  03/08/10    Dr. [name]-->ext/int hemorrhoids, anal paipilla, rectal polyps, desc polyps, cecal polyp, left-sided diverticula. suboptiomal prep. next TCS 02/2015. multiple adenomas  . Esophagogastroduodenoscopy  03/08/10    Dr. Dellie Catholic erosive RE, small hh, antral erosions, small 1cm are of mucosal indentation along gastric body of doubtful significance, cystic nodularity of hypophyarynx, base of tongue, base of epiglottis. benign gastric biopsies  . Tubal ligation    . Esophagogastroduodenoscopy  10/08/2011    Dr. Dionisio David esophageal varices, antral erosion. next egd 09/2013  . Foot surgery  2003    lt foot  . Breast surgery    . Eye surgery      cataracts    Medications Prior to Admission  Medication Sig Dispense Refill  . ALPRAZolam (XANAX) 0.5 MG tablet Take 0.5 mg by mouth 3 (three) times daily as needed for anxiety.      Marland Kitchen aspirin (BAYER LOW STRENGTH) 81 MG EC tablet Take 81 mg by mouth daily.        . budesonide-formoterol (SYMBICORT) 160-4.5 MCG/ACT inhaler Inhale 2 puffs into the lungs 2 (  two) times daily.        . clotrimazole (LOTRIMIN) 1 % cream Apply topically 2 (two) times daily.  30 g  0  . enalapril (VASOTEC) 5 MG tablet Take 5 mg by mouth daily.        Marland Kitchen esomeprazole (NEXIUM) 40 MG capsule Take 40 mg by mouth daily before breakfast.        . HYDROcodone-acetaminophen (NORCO) 10-325 MG per tablet Take 1 tablet by mouth every 4 (four) hours as needed for pain.      Marland Kitchen insulin aspart (NOVOLOG) 100 UNIT/ML injection Inject 10-40 Units into the skin daily as needed. Per sliding scale. Patient injects 10-40 units for levels above 150 to 300       . insulin  detemir (LEVEMIR) 100 UNIT/ML injection Inject 60 Units into the skin at bedtime.       Marland Kitchen lactulose (CHRONULAC) 10 GM/15ML solution Take 60 mLs by mouth daily.       Marland Kitchen linagliptin (TRADJENTA) 5 MG TABS tablet Take 5 mg by mouth daily.      . meclizine (ANTIVERT) 25 MG tablet Take 25 mg by mouth 3 (three) times daily as needed for dizziness or nausea.       . Multiple Vitamins-Minerals (CENTRUM SILVER PO) Take 1 tablet by mouth daily.       . naproxen sodium (ALEVE) 220 MG tablet Take 440 mg by mouth daily as needed (pain).       Marland Kitchen oxyCODONE (OXY IR/ROXICODONE) 5 MG immediate release tablet Take 5 mg by mouth every 4 (four) hours as needed for pain.      . pregabalin (LYRICA) 100 MG capsule Take 100 mg by mouth 2 (two) times daily.         Allergies:  Allergies  Allergen Reactions  . Prochlorperazine Edisylate Other (See Comments)    Causes anxiety/numbness  . Compazine (Prochlorperazine Edisylate) Anxiety    Causes anxiety & nervousness    Family History  Problem Relation Age of Onset  . Coronary artery disease      FH  . Diabetes      FH  . Heart disease Mother   . Colon cancer Neg Hx     Social History:  reports that she has never smoked. She does not have any smokeless tobacco history on file. She reports that she does not drink alcohol or use illicit drugs.  Review of Systems: negative except as above   Blood pressure 124/58, pulse 82, temperature 98.4 F (36.9 C), temperature source Oral, resp. rate 16, height 5\' 8"  (1.727 m), weight 114 kg (251 lb 5.2 oz), SpO2 99.00%. Head: Normocephalic, without obvious abnormality, atraumatic Neck: no adenopathy, no carotid bruit, no JVD, supple, symmetrical, trachea midline and thyroid not enlarged, symmetric, no tenderness/mass/nodules Resp: clear to auscultation bilaterally Cardio: regular rate and rhythm, S1, S2 normal, no murmur, click, rub or gallop GI: Abdomen soft nondistended, obese, no organomegaly palpable Extremities:  extremities normal, atraumatic, no cyanosis or edema  Results for orders placed during the hospital encounter of 02/14/13 (from the past 48 hour(s))  CG4 I-STAT (LACTIC ACID)     Status: Abnormal   Collection Time    02/14/13 12:21 PM      Result Value Range   Lactic Acid, Venous 2.36 (*) 0.5 - 2.2 mmol/L  AMMONIA     Status: None   Collection Time    02/14/13  3:05 PM      Result Value Range   Ammonia 45  11 - 60 umol/L  GLUCOSE, CAPILLARY     Status: Abnormal   Collection Time    02/14/13  4:26 PM      Result Value Range   Glucose-Capillary 308 (*) 70 - 99 mg/dL  TSH     Status: None   Collection Time    02/14/13  6:05 PM      Result Value Range   TSH 1.288  0.350 - 4.500 uIU/mL  GLUCOSE, CAPILLARY     Status: Abnormal   Collection Time    02/14/13 10:15 PM      Result Value Range   Glucose-Capillary 305 (*) 70 - 99 mg/dL  GLUCOSE, CAPILLARY     Status: Abnormal   Collection Time    02/15/13  6:43 AM      Result Value Range   Glucose-Capillary 199 (*) 70 - 99 mg/dL  COMPREHENSIVE METABOLIC PANEL     Status: Abnormal   Collection Time    02/15/13  6:44 AM      Result Value Range   Sodium 135  135 - 145 mEq/L   Potassium 4.2  3.5 - 5.1 mEq/L   Chloride 103  96 - 112 mEq/L   CO2 23  19 - 32 mEq/L   Glucose, Bld 206 (*) 70 - 99 mg/dL   BUN 21  6 - 23 mg/dL   Creatinine, Ser 1.61  0.50 - 1.10 mg/dL   Calcium 8.3 (*) 8.4 - 10.5 mg/dL   Total Protein 5.3 (*) 6.0 - 8.3 g/dL   Albumin 2.6 (*) 3.5 - 5.2 g/dL   AST 17  0 - 37 U/L   ALT 7  0 - 35 U/L   Alkaline Phosphatase 69  39 - 117 U/L   Total Bilirubin 2.4 (*) 0.3 - 1.2 mg/dL   GFR calc non Af Amer 66 (*) >90 mL/min   GFR calc Af Amer 76 (*) >90 mL/min   Comment:            The eGFR has been calculated     using the CKD EPI equation.     This calculation has not been     validated in all clinical     situations.     eGFR's persistently     <90 mL/min signify     possible Chronic Kidney Disease.  CBC      Status: Abnormal   Collection Time    02/15/13  6:44 AM      Result Value Range   WBC 7.1  4.0 - 10.5 K/uL   RBC 3.98  3.87 - 5.11 MIL/uL   Hemoglobin 11.5 (*) 12.0 - 15.0 g/dL   HCT 09.6 (*) 04.5 - 40.9 %   MCV 81.4  78.0 - 100.0 fL   MCH 28.9  26.0 - 34.0 pg   MCHC 35.5  30.0 - 36.0 g/dL   RDW 81.1  91.4 - 78.2 %   Platelets PLATELETS APPEAR ADEQUATE  150 - 400 K/uL   Comment: REPEATED TO VERIFY     SPECIMEN CHECKED FOR CLOTS  GLUCOSE, CAPILLARY     Status: Abnormal   Collection Time    02/15/13 11:28 AM      Result Value Range   Glucose-Capillary 290 (*) 70 - 99 mg/dL   Comment 1 Notify RN    GLUCOSE, CAPILLARY     Status: Abnormal   Collection Time    02/15/13  4:06 PM      Result Value Range  Glucose-Capillary 200 (*) 70 - 99 mg/dL  GLUCOSE, CAPILLARY     Status: Abnormal   Collection Time    02/15/13 10:32 PM      Result Value Range   Glucose-Capillary 302 (*) 70 - 99 mg/dL  CBC     Status: Abnormal   Collection Time    02/16/13  6:27 AM      Result Value Range   WBC 5.0  4.0 - 10.5 K/uL   RBC 3.39 (*) 3.87 - 5.11 MIL/uL   Hemoglobin 9.8 (*) 12.0 - 15.0 g/dL   HCT 16.1 (*) 09.6 - 04.5 %   MCV 80.2  78.0 - 100.0 fL   MCH 28.9  26.0 - 34.0 pg   MCHC 36.0  30.0 - 36.0 g/dL   RDW 40.9  81.1 - 91.4 %   Platelets 116 (*) 150 - 400 K/uL   Comment: PLATELET COUNT CONFIRMED BY SMEAR  COMPREHENSIVE METABOLIC PANEL     Status: Abnormal   Collection Time    02/16/13  6:27 AM      Result Value Range   Sodium 134 (*) 135 - 145 mEq/L   Potassium 3.5  3.5 - 5.1 mEq/L   Chloride 102  96 - 112 mEq/L   CO2 24  19 - 32 mEq/L   Glucose, Bld 250 (*) 70 - 99 mg/dL   BUN 17  6 - 23 mg/dL   Creatinine, Ser 7.82  0.50 - 1.10 mg/dL   Calcium 8.1 (*) 8.4 - 10.5 mg/dL   Total Protein 4.8 (*) 6.0 - 8.3 g/dL   Albumin 2.3 (*) 3.5 - 5.2 g/dL   AST 15  0 - 37 U/L   ALT 7  0 - 35 U/L   Alkaline Phosphatase 60  39 - 117 U/L   Total Bilirubin 2.1 (*) 0.3 - 1.2 mg/dL   GFR calc non  Af Amer 87 (*) >90 mL/min   GFR calc Af Amer >90  >90 mL/min   Comment:            The eGFR has been calculated     using the CKD EPI equation.     This calculation has not been     validated in all clinical     situations.     eGFR's persistently     <90 mL/min signify     possible Chronic Kidney Disease.  GLUCOSE, CAPILLARY     Status: Abnormal   Collection Time    02/16/13  6:30 AM      Result Value Range   Glucose-Capillary 243 (*) 70 - 99 mg/dL  GLUCOSE, CAPILLARY     Status: Abnormal   Collection Time    02/16/13 11:46 AM      Result Value Range   Glucose-Capillary 234 (*) 70 - 99 mg/dL   Comment 1 Notify RN     Comment 2 Documented in Chart     Dg Chest 1 View  02/14/2013  *RADIOLOGY REPORT*  Clinical Data: Weakness and recent right humeral fracture.  CHEST - 1 VIEW  Comparison: 11/28/2012  Findings: Lung volumes are low and the heart is moderately enlarged.  There is some prominence of pulmonary vascularity without overt edema.  No pleural fluid or pneumothorax.  IMPRESSION: Low lung volumes and cardiomegaly.   Original Report Authenticated By: Irish Lack, M.D.    US Abdomen Limited  02/16/2013  *RADIOLOGY REPORT*  Clinical Data: Abdominal discomfort.  Assess for ascites.  History of cirrhosis and  diabetes.  LIMITED ABDOMEN ULTRASOUND FOR ASCITES  Technique:  Limited ultrasound survey for ascites was performed in all four abdominal quadrants.  Comparison:  Abdominal ultrasound performed 08/29/2012  Findings: Evaluation of all four quadrants of the abdomen demonstrates no evidence for ascites.  No focal abnormalities are identified.  IMPRESSION: No ultrasonographic evidence of ascites within the abdomen.   Original Report Authenticated By: Tonia Ghent, M.D.    Dg Humerus Right  02/14/2013  *RADIOLOGY REPORT*  Clinical Data: Increased pain at site of recent humeral fracture.  RIGHT HUMERUS - 2+ VIEW  Comparison: 02/11/2013  Findings: The proximal humeral shaft fracture  demonstrates similar degree of displacement and angulation since the prior study with maximal displacement of approximately a full shaft width.  No associated dislocation.  Stable soft tissue prominence.  IMPRESSION: Similar displacement and angulation at the level of the proximal shaft fracture of the right humerus.   Original Report Authenticated By: Irish Lack, M.D.     Assessment: Preoperative assessment for operative/anesthesia risk a stone liver disease. Plan:  The patient is a child's B. categories with a score of 9. Arrange for child's B. is 7-9. This in and of itself would put her at least moderate surgical risk and it has been estimated that 1 year survival for surgeries in general with general anesthesia would be 80% for child's B. and 60% 2 year survival. Type of surgery, other comorbid factors would also certainly affect her individual risk in various ways. I talked to her and all her close family members and answered questions and they expressed understanding of  the discussion and the  inherent limitations of these sorts of assessments in a given individual. Perlie Scheuring C 02/16/2013, 12:18 PM

## 2013-02-16 NOTE — Progress Notes (Signed)
  Echocardiogram 2D Echocardiogram has been performed.  [name] [name] 02/16/2013, 11:06 AM

## 2013-02-16 NOTE — Consult Note (Signed)
Orthopaedic Trauma Service Consult   Reason for Consult: Right Proximal Humerus Fracture Referring Physician: S. Romeo Apple, MD (Ortho, Victory Dakin, MD (medicine)   HPI:   Pt is a 72 y/o RHD white female with numerous medical comorbidities that was seen at [facility] by Dr. Romeo Apple on 02/11/2013 for R proximal humerus fracture after a ground level fall.  Pt was splinted and sent out from the ED on 02/11/2013. She was scheduled to see OTS today 02/16/2013.  Pt was [facility]-admitted to [facility] due to altered mental status. Pt was admitted to the medicine service, was found to have UTI as well as elevated ammonia levels. Pt has been under tx for both and is doing better.   Currently pt is in room 5N14 and c/o R arm pain.  She reports baseline numbness and tingling in her right thumb and index finger and notes no acute changes.  She is concerned about potential surgery and her cirrhosis.  Pt denies any additional injuries   Pt does use a walker at baseline to mobilize with but has been in a WC since fall and has been requiring a 2 person assist    Past Medical History  Diagnosis Date  . CAD in native artery   . Thrombocytopenia, unspecified   . Unspecified hereditary and idiopathic peripheral neuropathy   . Unspecified fall   . Other and unspecified hyperlipidemia   . Unspecified essential hypertension   . Obesity, unspecified   . Shortness of breath   . Personal history of neurosis   . Intrinsic asthma, unspecified   . Allergic rhinitis, cause unspecified   . Cirrhosis of liver     NASH, afp on 06/17/12 =3.7, per pt she had hep B vaccines in 1996  . Colon adenomas 03/08/10    tcs by Dr. Jena Gauss  . Hyperplastic polyps of stomach 03/08/10    tcs by Dr. Geni Bers  . Chronic gastritis 03/09/11    egd by Dr. Jena Gauss  . History of hemorrhoids 03/08/10    tcs- internal and external  . Diverticula of colon 03/08/10    L side  . Hiatal hernia 03/08/10  . Esophagitis, erosive 03/08/10  . GERD  (gastroesophageal reflux disease)   . Wears glasses   . Type II or unspecified type diabetes mellitus without mention of complication, not stated as uncontrolled   . Vertigo     Past Surgical History  Procedure Laterality Date  . Hysterectomy and btl      s/p  . Abdominal hysterectomy    . Tumor excision  2003    rt arm and left foot  . Colonoscopy  03/08/10    Dr. [name]-->ext/int hemorrhoids, anal paipilla, rectal polyps, desc polyps, cecal polyp, left-sided diverticula. suboptiomal prep. next TCS 02/2015. multiple adenomas  . Esophagogastroduodenoscopy  03/08/10    Dr. Dellie Catholic erosive RE, small hh, antral erosions, small 1cm are of mucosal indentation along gastric body of doubtful significance, cystic nodularity of hypophyarynx, base of tongue, base of epiglottis. benign gastric biopsies  . Tubal ligation    . Esophagogastroduodenoscopy  10/08/2011    Dr. Dionisio David esophageal varices, antral erosion. next egd 09/2013  . Foot surgery  2003    lt foot  . Breast surgery    . Eye surgery      cataracts    Family History  Problem Relation Age of Onset  . Coronary artery disease      FH  . Diabetes      FH  .  Heart disease Mother   . Colon cancer Neg Hx     Social History:  reports that she has never smoked. She does not have any smokeless tobacco history on file. She reports that she does not drink alcohol or use illicit drugs.  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 Prochlorperazine Edisylate Other (See Comments)    Causes anxiety/numbness  . Compazine (Prochlorperazine Edisylate) Anxiety    Causes anxiety & nervousness    Medications:  I have reviewed the patient's current medications. Prior to Admission:  Prescriptions prior to admission  Medication Sig Dispense Refill  . ALPRAZolam (XANAX) 0.5 MG tablet Take 0.5 mg by mouth 3 (three) times daily as needed for  anxiety.      Marland Kitchen aspirin (BAYER LOW STRENGTH) 81 MG EC tablet Take 81 mg by mouth daily.        . budesonide-formoterol (SYMBICORT) 160-4.5 MCG/ACT inhaler Inhale 2 puffs into the lungs 2 (two) times daily.        . clotrimazole (LOTRIMIN) 1 % cream Apply topically 2 (two) times daily.  30 g  0  . enalapril (VASOTEC) 5 MG tablet Take 5 mg by mouth daily.        Marland Kitchen esomeprazole (NEXIUM) 40 MG capsule Take 40 mg by mouth daily before breakfast.        . HYDROcodone-acetaminophen (NORCO) 10-325 MG per tablet Take 1 tablet by mouth every 4 (four) hours as needed for pain.      Marland Kitchen insulin aspart (NOVOLOG) 100 UNIT/ML injection Inject 10-40 Units into the skin daily as needed. Per sliding scale. Patient injects 10-40 units for levels above 150 to 300       . insulin detemir (LEVEMIR) 100 UNIT/ML injection Inject 60 Units into the skin at bedtime.       Marland Kitchen lactulose (CHRONULAC) 10 GM/15ML solution Take 60 mLs by mouth daily.       Marland Kitchen linagliptin (TRADJENTA) 5 MG TABS tablet Take 5 mg by mouth daily.      . meclizine (ANTIVERT) 25 MG tablet Take 25 mg by mouth 3 (three) times daily as needed for dizziness or nausea.       . Multiple Vitamins-Minerals (CENTRUM SILVER PO) Take 1 tablet by mouth daily.       . naproxen sodium (ALEVE) 220 MG tablet Take 440 mg by mouth daily as needed (pain).       Marland Kitchen oxyCODONE (OXY IR/ROXICODONE) 5 MG immediate release tablet Take 5 mg by mouth every 4 (four) hours as needed for pain.      . pregabalin (LYRICA) 100 MG capsule Take 100 mg by mouth 2 (two) times daily.         Results for orders placed during the hospital encounter of 02/14/13 (from the past 48 hour(s))  URINALYSIS, ROUTINE W REFLEX MICROSCOPIC     Status: Abnormal   Collection Time    02/14/13 11:56 AM      Result Value Range   Color, Urine AMBER (*) YELLOW   Comment: BIOCHEMICALS MAY BE AFFECTED BY COLOR   APPearance TURBID (*) CLEAR   Specific Gravity, Urine 1.039 (*) 1.005 - 1.030   pH 5.5  5.0 - 8.0    Glucose, UA >1000 (*) NEGATIVE mg/dL   Hgb urine dipstick LARGE (*) NEGATIVE   Bilirubin Urine SMALL (*) NEGATIVE   Ketones, ur 15 (*) NEGATIVE  mg/dL   Protein, ur 30 (*) NEGATIVE mg/dL   Urobilinogen, UA 1.0  0.0 - 1.0 mg/dL   Nitrite POSITIVE (*) NEGATIVE   Leukocytes, UA MODERATE (*) NEGATIVE  URINE MICROSCOPIC-ADD ON     Status: Abnormal   Collection Time    02/14/13 11:56 AM      Result Value Range   Squamous Epithelial / LPF RARE  RARE   WBC, UA TOO NUMEROUS TO COUNT  <3 WBC/hpf   RBC / HPF 21-50  <3 RBC/hpf   Bacteria, UA MANY (*) RARE   Urine-Other RARE YEAST    URINE CULTURE     Status: None   Collection Time    02/14/13 11:56 AM      Result Value Range   Specimen Description URINE, CLEAN CATCH     Special Requests NONE     Culture  Setup Time 02/14/2013 22:05     Colony Count >=100,000 COLONIES/ML     Culture ESCHERICHIA COLI     Report Status 02/16/2013 FINAL     Organism ID, Bacteria ESCHERICHIA COLI    CBC WITH DIFFERENTIAL     Status: Abnormal   Collection Time    02/14/13 12:00 PM      Result Value Range   WBC 12.8 (*) 4.0 - 10.5 K/uL   RBC 4.15  3.87 - 5.11 MIL/uL   Hemoglobin 12.2  12.0 - 15.0 g/dL   HCT 16.1 (*) 09.6 - 04.5 %   MCV 81.4  78.0 - 100.0 fL   MCH 29.4  26.0 - 34.0 pg   MCHC 36.1 (*) 30.0 - 36.0 g/dL   RDW 40.9  81.1 - 91.4 %   Platelets 145 (*) 150 - 400 K/uL   Neutrophils Relative 74  43 - 77 %   Neutro Abs 9.5 (*) 1.7 - 7.7 K/uL   Lymphocytes Relative 15  12 - 46 %   Lymphs Abs 1.9  0.7 - 4.0 K/uL   Monocytes Relative 10  3 - 12 %   Monocytes Absolute 1.3 (*) 0.1 - 1.0 K/uL   Eosinophils Relative 1  0 - 5 %   Eosinophils Absolute 0.1  0.0 - 0.7 K/uL   Basophils Relative 0  0 - 1 %   Basophils Absolute 0.1  0.0 - 0.1 K/uL  COMPREHENSIVE METABOLIC PANEL     Status: Abnormal   Collection Time    02/14/13 12:00 PM      Result Value Range   Sodium 133 (*) 135 - 145 mEq/L   Potassium 3.3 (*) 3.5 - 5.1 mEq/L   Chloride 100  96 - 112  mEq/L   CO2 22  19 - 32 mEq/L   Glucose, Bld 309 (*) 70 - 99 mg/dL   BUN 14  6 - 23 mg/dL   Creatinine, Ser 7.82  0.50 - 1.10 mg/dL   Calcium 8.2 (*) 8.4 - 10.5 mg/dL   Total Protein 5.7 (*) 6.0 - 8.3 g/dL   Albumin 3.0 (*) 3.5 - 5.2 g/dL   AST 20  0 - 37 U/L   ALT 9  0 - 35 U/L   Alkaline Phosphatase 74  39 - 117 U/L   Total Bilirubin 3.6 (*) 0.3 - 1.2 mg/dL   GFR calc non Af Amer 86 (*) >90 mL/min   GFR calc Af Amer >90  >90 mL/min   Comment:            The eGFR has been calculated  using the CKD EPI equation.     This calculation has not been     validated in all clinical     situations.     eGFR's persistently     <90 mL/min signify     possible Chronic Kidney Disease.  PROTIME-INR     Status: None   Collection Time    02/14/13 12:00 PM      Result Value Range   Prothrombin Time 14.6  11.6 - 15.2 seconds   INR 1.16  0.00 - 1.49  MAGNESIUM     Status: None   Collection Time    02/14/13 12:00 PM      Result Value Range   Magnesium 2.1  1.5 - 2.5 mg/dL   Comment: HEMOLYSIS AT THIS LEVEL MAY AFFECT RESULT  HEMOGLOBIN A1C     Status: Abnormal   Collection Time    02/14/13 12:00 PM      Result Value Range   Hemoglobin A1C 8.1 (*) <5.7 %   Comment: (NOTE)                                                                               According to the ADA Clinical Practice Recommendations for 2011, when     HbA1c is used as a screening test:      >=6.5%   Diagnostic of Diabetes Mellitus               (if abnormal result is confirmed)     5.7-6.4%   Increased risk of developing Diabetes Mellitus     References:Diagnosis and Classification of Diabetes Mellitus,Diabetes     Care,2011,34(Suppl 1):S62-S69 and Standards of Medical Care in             Diabetes - 2011,Diabetes Care,2011,34 (Suppl 1):S11-S61.   Mean Plasma Glucose 186 (*) <117 mg/dL  GLUCOSE, CAPILLARY     Status: Abnormal   Collection Time    02/14/13 12:17 PM      Result Value Range   Glucose-Capillary  289 (*) 70 - 99 mg/dL  AMMONIA     Status: None   Collection Time    02/14/13  3:05 PM      Result Value Range   Ammonia 45  11 - 60 umol/L  GLUCOSE, CAPILLARY     Status: Abnormal   Collection Time    02/14/13  4:26 PM      Result Value Range   Glucose-Capillary 308 (*) 70 - 99 mg/dL  TSH     Status: None   Collection Time    02/14/13  6:05 PM      Result Value Range   TSH 1.288  0.350 - 4.500 uIU/mL  GLUCOSE, CAPILLARY     Status: Abnormal   Collection Time    02/14/13 10:15 PM      Result Value Range   Glucose-Capillary 305 (*) 70 - 99 mg/dL  GLUCOSE, CAPILLARY     Status: Abnormal   Collection Time    02/15/13  6:43 AM      Result Value Range   Glucose-Capillary 199 (*) 70 - 99 mg/dL  COMPREHENSIVE METABOLIC PANEL     Status: Abnormal   Collection  Time    02/15/13  6:44 AM      Result Value Range   Sodium 135  135 - 145 mEq/L   Potassium 4.2  3.5 - 5.1 mEq/L   Chloride 103  96 - 112 mEq/L   CO2 23  19 - 32 mEq/L   Glucose, Bld 206 (*) 70 - 99 mg/dL   BUN 21  6 - 23 mg/dL   Creatinine, Ser 1.61  0.50 - 1.10 mg/dL   Calcium 8.3 (*) 8.4 - 10.5 mg/dL   Total Protein 5.3 (*) 6.0 - 8.3 g/dL   Albumin 2.6 (*) 3.5 - 5.2 g/dL   AST 17  0 - 37 U/L   ALT 7  0 - 35 U/L   Alkaline Phosphatase 69  39 - 117 U/L   Total Bilirubin 2.4 (*) 0.3 - 1.2 mg/dL   GFR calc non Af Amer 66 (*) >90 mL/min   GFR calc Af Amer 76 (*) >90 mL/min   Comment:            The eGFR has been calculated     using the CKD EPI equation.     This calculation has not been     validated in all clinical     situations.     eGFR's persistently     <90 mL/min signify     possible Chronic Kidney Disease.  CBC     Status: Abnormal   Collection Time    02/15/13  6:44 AM      Result Value Range   WBC 7.1  4.0 - 10.5 K/uL   RBC 3.98  3.87 - 5.11 MIL/uL   Hemoglobin 11.5 (*) 12.0 - 15.0 g/dL   HCT 09.6 (*) 04.5 - 40.9 %   MCV 81.4  78.0 - 100.0 fL   MCH 28.9  26.0 - 34.0 pg   MCHC 35.5  30.0 - 36.0  g/dL   RDW 81.1  91.4 - 78.2 %   Platelets PLATELETS APPEAR ADEQUATE  150 - 400 K/uL   Comment: REPEATED TO VERIFY     SPECIMEN CHECKED FOR CLOTS  GLUCOSE, CAPILLARY     Status: Abnormal   Collection Time    02/15/13 11:28 AM      Result Value Range   Glucose-Capillary 290 (*) 70 - 99 mg/dL   Comment 1 Notify RN    GLUCOSE, CAPILLARY     Status: Abnormal   Collection Time    02/15/13  4:06 PM      Result Value Range   Glucose-Capillary 200 (*) 70 - 99 mg/dL  GLUCOSE, CAPILLARY     Status: Abnormal   Collection Time    02/15/13 10:32 PM      Result Value Range   Glucose-Capillary 302 (*) 70 - 99 mg/dL  CBC     Status: Abnormal   Collection Time    02/16/13  6:27 AM      Result Value Range   WBC 5.0  4.0 - 10.5 K/uL   RBC 3.39 (*) 3.87 - 5.11 MIL/uL   Hemoglobin 9.8 (*) 12.0 - 15.0 g/dL   HCT 95.6 (*) 21.3 - 08.6 %   MCV 80.2  78.0 - 100.0 fL   MCH 28.9  26.0 - 34.0 pg   MCHC 36.0  30.0 - 36.0 g/dL   RDW 57.8  46.9 - 62.9 %   Platelets 116 (*) 150 - 400 K/uL   Comment: PLATELET COUNT CONFIRMED BY SMEAR  COMPREHENSIVE METABOLIC  PANEL     Status: Abnormal   Collection Time    02/16/13  6:27 AM      Result Value Range   Sodium 134 (*) 135 - 145 mEq/L   Potassium 3.5  3.5 - 5.1 mEq/L   Chloride 102  96 - 112 mEq/L   CO2 24  19 - 32 mEq/L   Glucose, Bld 250 (*) 70 - 99 mg/dL   BUN 17  6 - 23 mg/dL   Creatinine, Ser 1.61  0.50 - 1.10 mg/dL   Calcium 8.1 (*) 8.4 - 10.5 mg/dL   Total Protein 4.8 (*) 6.0 - 8.3 g/dL   Albumin 2.3 (*) 3.5 - 5.2 g/dL   AST 15  0 - 37 U/L   ALT 7  0 - 35 U/L   Alkaline Phosphatase 60  39 - 117 U/L   Total Bilirubin 2.1 (*) 0.3 - 1.2 mg/dL   GFR calc non Af Amer 87 (*) >90 mL/min   GFR calc Af Amer >90  >90 mL/min   Comment:            The eGFR has been calculated     using the CKD EPI equation.     This calculation has not been     validated in all clinical     situations.     eGFR's persistently     <90 mL/min signify     possible  Chronic Kidney Disease.  GLUCOSE, CAPILLARY     Status: Abnormal   Collection Time    02/16/13  6:30 AM      Result Value Range   Glucose-Capillary 243 (*) 70 - 99 mg/dL    Dg Chest 1 View  0/07/6044  *RADIOLOGY REPORT*  Clinical Data: Weakness and recent right humeral fracture.  CHEST - 1 VIEW  Comparison: 11/28/2012  Findings: Lung volumes are low and the heart is moderately enlarged.  There is some prominence of pulmonary vascularity without overt edema.  No pleural fluid or pneumothorax.  IMPRESSION: Low lung volumes and cardiomegaly.   Original Report Authenticated By: Irish Lack, M.D.    US Abdomen Limited  02/16/2013  *RADIOLOGY REPORT*  Clinical Data: Abdominal discomfort.  Assess for ascites.  History of cirrhosis and diabetes.  LIMITED ABDOMEN ULTRASOUND FOR ASCITES  Technique:  Limited ultrasound survey for ascites was performed in all four abdominal quadrants.  Comparison:  Abdominal ultrasound performed 08/29/2012  Findings: Evaluation of all four quadrants of the abdomen demonstrates no evidence for ascites.  No focal abnormalities are identified.  IMPRESSION: No ultrasonographic evidence of ascites within the abdomen.   Original Report Authenticated By: Tonia Ghent, M.D.    Dg Humerus Right  02/14/2013  *RADIOLOGY REPORT*  Clinical Data: Increased pain at site of recent humeral fracture.  RIGHT HUMERUS - 2+ VIEW  Comparison: 02/11/2013  Findings: The proximal humeral shaft fracture demonstrates similar degree of displacement and angulation since the prior study with maximal displacement of approximately a full shaft width.  No associated dislocation.  Stable soft tissue prominence.  IMPRESSION: Similar displacement and angulation at the level of the proximal shaft fracture of the right humerus.   Original Report Authenticated By: Irish Lack, M.D.     Review of Systems  Constitutional: Negative for fever and chills.  Respiratory: Negative for shortness of breath.    Cardiovascular: Negative for chest pain and palpitations.  Gastrointestinal: Negative for nausea and vomiting.  Musculoskeletal:       R shoulder pain  Neurological: Negative for sensory change.       Baseline tingling R hand (thumb and index fingers)  Endo/Heme/Allergies: Bruises/bleeds easily.   Blood pressure 124/58, pulse 82, temperature 98.4 F (36.9 C), temperature source Oral, resp. rate 16, height 5\' 8"  (1.727 m), weight 114 kg (251 lb 5.2 oz), SpO2 99.00%. Physical Exam  Constitutional: She is cooperative. She appears ill.  Obese female  HENT:  Head: Normocephalic and atraumatic.  Eyes: EOM are normal.  Cardiovascular:  s1 and s2  Respiratory:  Clear anterior fields  GI:  Obese, soft, + BS  Musculoskeletal:  Right Upper Extremity   Splint and sling in place     Splint not removed   Significant ecchymosis to upper arm noted   + Radial pulse noted   R/U/M/Ax sensation intact   R/U/M motor intact   + swelling to R upper arm   Not able to range elbow, forearm, wrist due to splint   TTP R proximal humerus    Neurological: She is alert.  Skin: Skin is warm and intact.    Assessment/Plan:  72 y/o RHD female with R proximal humerus fx admitted for acute encephalopathy and UTI  1. Fall 2. R proximal 1/3 humerus fracture  Given fracture location, pattern as well as pt factors, including body habitus and medical comorbidities she would greatly benefit from formal ORIF of her fracture   Pt is at increased risk for nonunion given her insulin dependent DM, cirrhosis and obesity  This fracture would be very difficult to tx non-operatively and would likely have a protracted course of healing, if it were to heal at all.  We suspect that if treated non-operatively pt would go on to develop some type of fibrous non-union which would necessitate fixation later on.  This would be a much more complex procedure with longer operative time, thus increasing risk   It is our  recommendation to proceed with ORIF tomorrow to minimize long term risk.  Further, pt requires the use of a walker to facilitate ambulation, she will be unable to use a walker if tx'd non-op as she will be nonweight bearing on that arm.  We could consider early WB on her R arm after ORIF   3. Medical issues   Per IM  4.  UTI  On Ceftriaxone for E. coli UTI 5. Encephalopathy  Resolved 6. DVT/PE prophylaxis  Defer pharmacologics to IM  Continue with mechanical pumps 7. Pain  Limit tylenol due to cirrhosis  Minimize narcs due to encephalopathy   8. Dispo  Plan for OR tomorrow  Pt will discuss with husband  Strongly recommend fixation   Mearl Latin, PA-C [facility] [phone] (P) 02/16/2013, 9:31 AM

## 2013-02-16 NOTE — Progress Notes (Addendum)
[facility] PROGRESS NOTE  [name] [name]:[id number] DOB: 1941-06-17 DOA: 02/14/2013 PCP: Colette Ribas, MD  Assessment/Plan: 1. Encephalopathy: Resolved., probably related to UTI. Abdominal US negative for ascites. 2. UTI: Continue with Ceftriaxone 3.  urine culture: E coli.    3. Hyponatremia: Stable.  4. Hypokalemia: Resolved. 5. [name] Cirrhosis: Bilirubin mildly increase on admission. Bilirubin at baseline.  6. Diabetes: Continue with Linagliptin, Levemir, novolog.  7. Constipation: increase lactulose. Had BM last night. 8. Right Humerus fracture: Patient with splint, I appreciate Dr Jillyn Hidden help. Dr Carola Frost consulted.  9. Dizziness: improved. ? Vestibular deficits. Will order vestibular evaluation.   Code Status: Full  Family Communication: none at bedside.  Disposition Plan: to be determine.    Consultants:  None  Procedures:  none  Antibiotics: Ceftriaxone 4-5  HPI/Subjective: Feeling well. Had BM. Relates pain right arm.  Objective: Filed Vitals:   02/15/13 1500 02/15/13 2010 02/15/13 2101 02/16/13 0532  BP: 115/53  135/61 124/58  Pulse: 94  94 82  Temp: 99.5 F (37.5 C)  99.6 F (37.6 C) 98.4 F (36.9 C)  TempSrc: Oral     Resp:   16 16  Height:      Weight:      SpO2: 98% 94% 97% 99%    Intake/Output Summary (Last 24 hours) at 02/16/13 0801 Last data filed at 02/16/13 0600  Gross per 24 hour  Intake   1320 ml  Output    150 ml  Net   1170 ml   Filed Weights   02/14/13 1700 02/15/13 0500  Weight: 108.863 kg (240 lb) 114 kg (251 lb 5.2 oz)    Exam:   General:  Awake, in no distress.   Cardiovascular: S 1, S 2 RRR  Respiratory: CTA  Abdomen: Bs present, soft.  Musculoskeletal: trace edema. right arm with splint.   Data Reviewed: Basic Metabolic Panel:  Recent Labs Lab 02/14/13 1200 02/15/13 0644 02/16/13 0627  NA 133* 135 134*  K 3.3* 4.2 3.5  CL 100 103 102  CO2 22 23 24   GLUCOSE 309* 206* 250*  BUN 14 21 17    CREATININE 0.70 0.88 0.68  CALCIUM 8.2* 8.3* 8.1*  MG 2.1  --   --    Liver Function Tests:  Recent Labs Lab 02/14/13 1200 02/15/13 0644 02/16/13 0627  AST 20 17 15   ALT 9 7 7   ALKPHOS 74 69 60  BILITOT 3.6* 2.4* 2.1*  PROT 5.7* 5.3* 4.8*  ALBUMIN 3.0* 2.6* 2.3*   No results found for this basename: LIPASE, AMYLASE,  in the last 168 hours  Recent Labs Lab 02/14/13 1505  AMMONIA 45   CBC:  Recent Labs Lab 02/14/13 1200 02/15/13 0644 02/16/13 0627  WBC 12.8* 7.1 5.0  NEUTROABS 9.5*  --   --   HGB 12.2 11.5* 9.8*  HCT 33.8* 32.4* 27.2*  MCV 81.4 81.4 80.2  PLT 145* PLATELETS APPEAR ADEQUATE 116*   Cardiac Enzymes: No results found for this basename: CKTOTAL, CKMB, CKMBINDEX, TROPONINI,  in the last 168 hours BNP (last 3 results) No results found for this basename: PROBNP,  in the last 8760 hours CBG:  Recent Labs Lab 02/15/13 0643 02/15/13 1128 02/15/13 1606 02/15/13 2232 02/16/13 0630  GLUCAP 199* 290* 200* 302* 243*    Recent Results (from the past 240 hour(s))  URINE CULTURE     Status: None   Collection Time    02/14/13 11:56 AM      Result Value Range  Status   Specimen Description URINE, CLEAN CATCH   Final   Special Requests NONE   Final   Culture  Setup Time 02/14/2013 22:05   Final   Colony Count >=100,000 COLONIES/ML   Final   Culture ESCHERICHIA COLI   Final   Report Status 02/16/2013 FINAL   Final   Organism ID, Bacteria ESCHERICHIA COLI   Final     Studies: Dg Chest 1 View  02/14/2013  *RADIOLOGY REPORT*  Clinical Data: Weakness and recent right humeral fracture.  CHEST - 1 VIEW  Comparison: 11/28/2012  Findings: Lung volumes are low and the heart is moderately enlarged.  There is some prominence of pulmonary vascularity without overt edema.  No pleural fluid or pneumothorax.  IMPRESSION: Low lung volumes and cardiomegaly.   Original Report Authenticated By: Irish Lack, M.D.    US Abdomen Limited  02/16/2013  *RADIOLOGY REPORT*   Clinical Data: Abdominal discomfort.  Assess for ascites.  History of cirrhosis and diabetes.  LIMITED ABDOMEN ULTRASOUND FOR ASCITES  Technique:  Limited ultrasound survey for ascites was performed in all four abdominal quadrants.  Comparison:  Abdominal ultrasound performed 08/29/2012  Findings: Evaluation of all four quadrants of the abdomen demonstrates no evidence for ascites.  No focal abnormalities are identified.  IMPRESSION: No ultrasonographic evidence of ascites within the abdomen.   Original Report Authenticated By: Tonia Ghent, M.D.    Dg Humerus Right  02/14/2013  *RADIOLOGY REPORT*  Clinical Data: Increased pain at site of recent humeral fracture.  RIGHT HUMERUS - 2+ VIEW  Comparison: 02/11/2013  Findings: The proximal humeral shaft fracture demonstrates similar degree of displacement and angulation since the prior study with maximal displacement of approximately a full shaft width.  No associated dislocation.  Stable soft tissue prominence.  IMPRESSION: Similar displacement and angulation at the level of the proximal shaft fracture of the right humerus.   Original Report Authenticated By: Irish Lack, M.D.     Scheduled Meds: . aspirin EC  81 mg Oral Daily  . budesonide-formoterol  2 puff Inhalation BID  . cefTRIAXone (ROCEPHIN)  IV  1 g Intravenous q morning - 10a  . clotrimazole   Topical BID  . docusate sodium  100 mg Oral Daily  . enalapril  5 mg Oral Daily  . fluticasone  1 spray Each Nare Daily  . insulin aspart  0-20 Units Subcutaneous TID WC  . insulin aspart  0-5 Units Subcutaneous QHS  . insulin aspart  6 Units Subcutaneous TID WC  . insulin detemir  60 Units Subcutaneous QHS  . lactulose  30 g Oral BID  . linagliptin  5 mg Oral Daily  . pantoprazole  80 mg Oral Q1200  . pregabalin  100 mg Oral QHS  . senna  1 tablet Oral QHS   Continuous Infusions:   Principal Problem:   Encephalopathy acute Active Problems:   OBESITY   THROMBOCYTOPENIA, CHRONIC    CIRRHOSIS   Uncontrolled type 2 diabetes mellitus with complication   Peripheral neuropathy   Muscle weakness (generalized)   Fracture of humeral shaft, right, closed   UTI (urinary tract infection)   Hyponatremia   Hypokalemia    Time spent: 25 minutes.     [name]  [facility] Pager [phone]. If 7PM-7AM, please contact night-coverage at [email or web address], password [facility] 02/16/2013, 8:01 AM  LOS: 2 days

## 2013-02-16 NOTE — Progress Notes (Signed)
[facility] PROGRESS NOTE  [name] [name] ZOX:[id number] DOB: 02-Dec-1940 DOA: 02/14/2013 PCP: Colette Ribas, MD  Assessment/Plan: 1. Encephalopathy: Resolved., probably related to UTI. Abdominal US negative for ascites. 2. UTI: Continue with Ceftriaxone 3.  urine culture: E coli.    3. Hyponatremia: Stable.  4. Hypokalemia: Resolved. 5. [name] Cirrhosis: Bilirubin mildly increase on admission. Bilirubin at baseline. Will consult GI for risk and surgery clearance.  6. Diabetes: Continue with Linagliptin, Levemir, novolog.  7. Constipation: increase lactulose. Had BM last night. 8. Right Humerus fracture: Patient with splint, I appreciate Dr Jillyn Hidden help. Dr Carola Frost consulted.  9. Dizziness: improved. ? Vestibular deficits. Will order vestibular evaluation.   Code Status: Full  Family Communication: none at bedside.  Disposition Plan: to be determine.    Consultants:  None  Procedures:  none  Antibiotics: Ceftriaxone 4-5  HPI/Subjective: Feeling well. Had BM. Relates pain right arm.  Objective: Filed Vitals:   02/15/13 1500 02/15/13 2010 02/15/13 2101 02/16/13 0532  BP: 115/53  135/61 124/58  Pulse: 94  94 82  Temp: 99.5 F (37.5 C)  99.6 F (37.6 C) 98.4 F (36.9 C)  TempSrc: Oral     Resp:   16 16  Height:      Weight:      SpO2: 98% 94% 97% 99%    Intake/Output Summary (Last 24 hours) at 02/16/13 0905 Last data filed at 02/16/13 0600  Gross per 24 hour  Intake   1080 ml  Output    150 ml  Net    930 ml   Filed Weights   02/14/13 1700 02/15/13 0500  Weight: 108.863 kg (240 lb) 114 kg (251 lb 5.2 oz)    Exam:   General:  Awake, in no distress.   Cardiovascular: S 1, S 2 RRR  Respiratory: CTA  Abdomen: Bs present, soft.  Musculoskeletal: trace edema. right arm with splint.   Data Reviewed: Basic Metabolic Panel:  Recent Labs Lab 02/14/13 1200 02/15/13 0644 02/16/13 0627  NA 133* 135 134*  K 3.3* 4.2 3.5  CL 100 103 102  CO2 22  23 24   GLUCOSE 309* 206* 250*  BUN 14 21 17   CREATININE 0.70 0.88 0.68  CALCIUM 8.2* 8.3* 8.1*  MG 2.1  --   --    Liver Function Tests:  Recent Labs Lab 02/14/13 1200 02/15/13 0644 02/16/13 0627  AST 20 17 15   ALT 9 7 7   ALKPHOS 74 69 60  BILITOT 3.6* 2.4* 2.1*  PROT 5.7* 5.3* 4.8*  ALBUMIN 3.0* 2.6* 2.3*   No results found for this basename: LIPASE, AMYLASE,  in the last 168 hours  Recent Labs Lab 02/14/13 1505  AMMONIA 45   CBC:  Recent Labs Lab 02/14/13 1200 02/15/13 0644 02/16/13 0627  WBC 12.8* 7.1 5.0  NEUTROABS 9.5*  --   --   HGB 12.2 11.5* 9.8*  HCT 33.8* 32.4* 27.2*  MCV 81.4 81.4 80.2  PLT 145* PLATELETS APPEAR ADEQUATE 116*   Cardiac Enzymes: No results found for this basename: CKTOTAL, CKMB, CKMBINDEX, TROPONINI,  in the last 168 hours BNP (last 3 results) No results found for this basename: PROBNP,  in the last 8760 hours CBG:  Recent Labs Lab 02/15/13 0643 02/15/13 1128 02/15/13 1606 02/15/13 2232 02/16/13 0630  GLUCAP 199* 290* 200* 302* 243*    Recent Results (from the past 240 hour(s))  URINE CULTURE     Status: None   Collection Time    02/14/13 11:56  AM      Result Value Range Status   Specimen Description URINE, CLEAN CATCH   Final   Special Requests NONE   Final   Culture  Setup Time 02/14/2013 22:05   Final   Colony Count >=100,000 COLONIES/ML   Final   Culture ESCHERICHIA COLI   Final   Report Status 02/16/2013 FINAL   Final   Organism ID, Bacteria ESCHERICHIA COLI   Final     Studies: Dg Chest 1 View  02/14/2013  *RADIOLOGY REPORT*  Clinical Data: Weakness and recent right humeral fracture.  CHEST - 1 VIEW  Comparison: 11/28/2012  Findings: Lung volumes are low and the heart is moderately enlarged.  There is some prominence of pulmonary vascularity without overt edema.  No pleural fluid or pneumothorax.  IMPRESSION: Low lung volumes and cardiomegaly.   Original Report Authenticated By: Irish Lack, M.D.    US  Abdomen Limited  02/16/2013  *RADIOLOGY REPORT*  Clinical Data: Abdominal discomfort.  Assess for ascites.  History of cirrhosis and diabetes.  LIMITED ABDOMEN ULTRASOUND FOR ASCITES  Technique:  Limited ultrasound survey for ascites was performed in all four abdominal quadrants.  Comparison:  Abdominal ultrasound performed 08/29/2012  Findings: Evaluation of all four quadrants of the abdomen demonstrates no evidence for ascites.  No focal abnormalities are identified.  IMPRESSION: No ultrasonographic evidence of ascites within the abdomen.   Original Report Authenticated By: Tonia Ghent, M.D.    Dg Humerus Right  02/14/2013  *RADIOLOGY REPORT*  Clinical Data: Increased pain at site of recent humeral fracture.  RIGHT HUMERUS - 2+ VIEW  Comparison: 02/11/2013  Findings: The proximal humeral shaft fracture demonstrates similar degree of displacement and angulation since the prior study with maximal displacement of approximately a full shaft width.  No associated dislocation.  Stable soft tissue prominence.  IMPRESSION: Similar displacement and angulation at the level of the proximal shaft fracture of the right humerus.   Original Report Authenticated By: Irish Lack, M.D.     Scheduled Meds: . aspirin EC  81 mg Oral Daily  . budesonide-formoterol  2 puff Inhalation BID  . cefTRIAXone (ROCEPHIN)  IV  1 g Intravenous q morning - 10a  . clotrimazole   Topical BID  . docusate sodium  100 mg Oral Daily  . enalapril  5 mg Oral Daily  . fluticasone  1 spray Each Nare Daily  . insulin aspart  0-20 Units Subcutaneous TID WC  . insulin aspart  0-5 Units Subcutaneous QHS  . insulin aspart  6 Units Subcutaneous TID WC  . insulin detemir  60 Units Subcutaneous QHS  . lactulose  30 g Oral BID  . linagliptin  5 mg Oral Daily  . pantoprazole  80 mg Oral Q1200  . pregabalin  100 mg Oral QHS  . senna  1 tablet Oral QHS   Continuous Infusions:   Principal Problem:   Encephalopathy acute Active Problems:    OBESITY   THROMBOCYTOPENIA, CHRONIC   CIRRHOSIS   Uncontrolled type 2 diabetes mellitus with complication   Peripheral neuropathy   Muscle weakness (generalized)   Fracture of humeral shaft, right, closed   UTI (urinary tract infection)   Hyponatremia   Hypokalemia    Time spent: 25 minutes.     [name] [name]  [facility] Pager [phone]. If 7PM-7AM, please contact night-coverage at [email or web address], password [facility] 02/16/2013, 9:05 AM  LOS: 2 days

## 2013-02-17 ENCOUNTER — Inpatient Hospital Stay (HOSPITAL_COMMUNITY): Payer: Medicare Other

## 2013-02-17 ENCOUNTER — Encounter (HOSPITAL_COMMUNITY): Payer: Self-pay | Admitting: Emergency Medicine

## 2013-02-17 ENCOUNTER — Encounter (HOSPITAL_COMMUNITY): Payer: Self-pay | Admitting: Anesthesiology

## 2013-02-17 ENCOUNTER — Encounter (HOSPITAL_COMMUNITY)
Admission: EM | Disposition: A | Discharge status: Skilled Nursing Facility | Payer: Self-pay | Source: Home / Self Care | Attending: Internal Medicine

## 2013-02-17 ENCOUNTER — Inpatient Hospital Stay (HOSPITAL_COMMUNITY): Payer: Medicare Other | Admitting: Anesthesiology

## 2013-02-17 DIAGNOSIS — R42 Dizziness and giddiness: Secondary | ICD-10-CM

## 2013-02-17 HISTORY — PX: ORIF HUMERUS FRACTURE: SHX2126

## 2013-02-17 LAB — CBC
MCH: 28.7 pg (ref 26.0–34.0)
MCV: 82.3 fL (ref 78.0–100.0)
Platelets: 122 10*3/uL — ABNORMAL LOW (ref 150–400)
RDW: 14.9 % (ref 11.5–15.5)
WBC: 4.3 10*3/uL (ref 4.0–10.5)

## 2013-02-17 LAB — SURGICAL PCR SCREEN: MRSA, PCR: NEGATIVE

## 2013-02-17 LAB — GLUCOSE, CAPILLARY
Glucose-Capillary: 281 mg/dL — ABNORMAL HIGH (ref 70–99)
Glucose-Capillary: 248 mg/dL — ABNORMAL HIGH (ref 70–99)

## 2013-02-17 LAB — BASIC METABOLIC PANEL
Calcium: 7.9 mg/dL — ABNORMAL LOW (ref 8.4–10.5)
Chloride: 104 mEq/L (ref 96–112)
Creatinine, Ser: 0.55 mg/dL (ref 0.50–1.10)
GFR calc Af Amer: >90 mL/min (ref >90)

## 2013-02-17 LAB — ABO/RH: ABO/RH(D): A NEG

## 2013-02-17 LAB — VITAMIN D 25 HYDROXY (VIT D DEFICIENCY, FRACTURES): Vit D, 25-Hydroxy: 24 ng/mL — ABNORMAL LOW (ref 30–89)

## 2013-02-17 SURGERY — OPEN REDUCTION INTERNAL FIXATION (ORIF) PROXIMAL HUMERUS FRACTURE
Anesthesia: General | Site: Arm Lower | Laterality: Right | Wound class: Dirty or Infected

## 2013-02-17 MED ORDER — PHENOL 1.4 % MT LIQD: 1.0000 | OROMUCOSAL | Status: DC | PRN

## 2013-02-17 MED ORDER — ROCURONIUM BROMIDE 100 MG/10ML IV SOLN
INTRAVENOUS | Status: DC | PRN
  Administered 2013-02-17: 10 mg via INTRAVENOUS
  Administered 2013-02-17: 50 mg via INTRAVENOUS

## 2013-02-17 MED ORDER — ROPIVACAINE HCL 5 MG/ML IJ SOLN
INTRAMUSCULAR | Status: DC | PRN
  Administered 2013-02-17: 25 mL via EPIDURAL

## 2013-02-17 MED ORDER — METOCLOPRAMIDE HCL 5 MG/ML IJ SOLN: 5.0000 mg | Freq: Three times a day (TID) | INTRAMUSCULAR | Status: DC | PRN

## 2013-02-17 MED ORDER — CEFAZOLIN SODIUM 1-5 GM-% IV SOLN
1.0000 g | Freq: Three times a day (TID) | INTRAVENOUS | Status: DC
  Filled 2013-02-17 (×2): qty 50

## 2013-02-17 MED ORDER — SODIUM CHLORIDE 0.9 % IV SOLN
INTRAVENOUS | Status: DC
  Administered 2013-02-18: 20 mL/h via INTRAVENOUS
  Administered 2013-02-19: 05:00:00 via INTRAVENOUS

## 2013-02-17 MED ORDER — MENTHOL 3 MG MT LOZG: 1.0000 | LOZENGE | OROMUCOSAL | Status: DC | PRN

## 2013-02-17 MED ORDER — PROPOFOL 10 MG/ML IV BOLUS
INTRAVENOUS | Status: DC | PRN
  Administered 2013-02-17: 100 mg via INTRAVENOUS

## 2013-02-17 MED ORDER — 0.9 % SODIUM CHLORIDE (POUR BTL) OPTIME
TOPICAL | Status: DC | PRN
  Administered 2013-02-17: 1000 mL

## 2013-02-17 MED ORDER — HYDROMORPHONE HCL PF 1 MG/ML IJ SOLN: 0.2500 mg | INTRAMUSCULAR | Status: DC | PRN

## 2013-02-17 MED ORDER — SODIUM CHLORIDE 0.9 % IV SOLN
INTRAVENOUS | Status: DC | PRN
  Administered 2013-02-17 (×2): via INTRAVENOUS

## 2013-02-17 MED ORDER — HEMOSTATIC AGENTS (NO CHARGE) OPTIME
TOPICAL | Status: DC | PRN
  Administered 2013-02-17: 1 via TOPICAL

## 2013-02-17 MED ORDER — CEFAZOLIN SODIUM 1-5 GM-% IV SOLN
INTRAVENOUS | Status: AC
  Filled 2013-02-17: qty 50

## 2013-02-17 MED ORDER — OXYCODONE HCL 5 MG/5ML PO SOLN: 5.0000 mg | Freq: Once | ORAL | Status: DC | PRN

## 2013-02-17 MED ORDER — CEFAZOLIN SODIUM-DEXTROSE 2-3 GM-% IV SOLR
INTRAVENOUS | Status: DC | PRN
  Administered 2013-02-17: 2 g via INTRAVENOUS

## 2013-02-17 MED ORDER — ONDANSETRON HCL 4 MG/2ML IJ SOLN
INTRAMUSCULAR | Status: DC | PRN
  Administered 2013-02-17: 4 mg via INTRAVENOUS

## 2013-02-17 MED ORDER — LIDOCAINE HCL 4 % MT SOLN
OROMUCOSAL | Status: DC | PRN
  Administered 2013-02-17: 4 mL via TOPICAL

## 2013-02-17 MED ORDER — OXYCODONE HCL 5 MG PO TABS: 5.0000 mg | ORAL_TABLET | Freq: Once | ORAL | Status: DC | PRN

## 2013-02-17 MED ORDER — LACTATED RINGERS IV SOLN
INTRAVENOUS | Status: DC | PRN
  Administered 2013-02-17: 17:00:00 via INTRAVENOUS

## 2013-02-17 MED ORDER — FENTANYL CITRATE 0.05 MG/ML IJ SOLN
INTRAMUSCULAR | Status: DC | PRN
  Administered 2013-02-17 (×2): 50 ug via INTRAVENOUS

## 2013-02-17 MED ORDER — METOCLOPRAMIDE HCL 10 MG PO TABS: 5.0000 mg | ORAL_TABLET | Freq: Three times a day (TID) | ORAL | Status: DC | PRN

## 2013-02-17 MED ORDER — ONDANSETRON HCL 4 MG PO TABS: 4.0000 mg | ORAL_TABLET | Freq: Four times a day (QID) | ORAL | Status: DC | PRN

## 2013-02-17 MED ORDER — ONDANSETRON HCL 4 MG/2ML IJ SOLN: 4.0000 mg | Freq: Four times a day (QID) | INTRAMUSCULAR | Status: DC | PRN

## 2013-02-17 MED ORDER — ARTIFICIAL TEARS OP OINT
TOPICAL_OINTMENT | OPHTHALMIC | Status: DC | PRN
  Administered 2013-02-17: 1 via OPHTHALMIC

## 2013-02-17 MED ORDER — MORPHINE SULFATE 2 MG/ML IJ SOLN
1.0000 mg | INTRAMUSCULAR | Status: DC | PRN
  Administered 2013-02-18: 1 mg via INTRAVENOUS
  Filled 2013-02-17: qty 1

## 2013-02-17 MED ORDER — ALBUMIN HUMAN 5 % IV SOLN
INTRAVENOUS | Status: DC | PRN
  Administered 2013-02-17: 16:00:00 via INTRAVENOUS

## 2013-02-17 MED ORDER — NEOSTIGMINE METHYLSULFATE 1 MG/ML IJ SOLN
INTRAMUSCULAR | Status: DC | PRN
  Administered 2013-02-17: 3 mg via INTRAVENOUS

## 2013-02-17 MED ORDER — GLYCOPYRROLATE 0.2 MG/ML IJ SOLN
INTRAMUSCULAR | Status: DC | PRN
  Administered 2013-02-17: .6 mg via INTRAVENOUS

## 2013-02-17 SURGICAL SUPPLY — 72 items
BANDAGE ELASTIC 4 VELCRO ST LF (GAUZE/BANDAGES/DRESSINGS) ×4 IMPLANT
BANDAGE GAUZE ELAST BULKY 4 IN (GAUZE/BANDAGES/DRESSINGS) ×4 IMPLANT
BENZOIN TINCTURE PRP APPL 2/3 (GAUZE/BANDAGES/DRESSINGS) ×4 IMPLANT
BIT DRILL 2.5X110 QC LCP DISP (BIT) ×2 IMPLANT
BIT DRILL QC 3.5X110 (BIT) ×2 IMPLANT
BRUSH SCRUB DISP (MISCELLANEOUS) ×4 IMPLANT
CLOTH BEACON ORANGE TIMEOUT ST (SAFETY) ×2 IMPLANT
COVER SURGICAL LIGHT HANDLE (MISCELLANEOUS) ×4 IMPLANT
DRAPE C-ARM 42X72 X-RAY (DRAPES) ×2 IMPLANT
DRAPE C-ARMOR (DRAPES) ×2 IMPLANT
DRAPE INCISE IOBAN 66X45 STRL (DRAPES) ×2 IMPLANT
DRAPE ORTHO SPLIT 77X108 STRL (DRAPES) ×2
DRAPE SURG 17X11 SM STRL (DRAPES) ×4 IMPLANT
DRAPE SURG ORHT 6 SPLT 77X108 (DRAPES) ×2 IMPLANT
DRAPE U-SHAPE 47X51 STRL (DRAPES) ×4 IMPLANT
DRILL BIT 2.7X200MM CAL (BIT) ×4 IMPLANT
DRSG ADAPTIC 3X8 NADH LF (GAUZE/BANDAGES/DRESSINGS) ×2 IMPLANT
DRSG MEPILEX BORDER 4X12 (GAUZE/BANDAGES/DRESSINGS) ×2 IMPLANT
DRSG PAD ABDOMINAL 8X10 ST (GAUZE/BANDAGES/DRESSINGS) ×2 IMPLANT
ELECT REM PT RETURN 9FT ADLT (ELECTROSURGICAL) ×2
ELECTRODE REM PT RTRN 9FT ADLT (ELECTROSURGICAL) ×1 IMPLANT
EVACUATOR 1/8 PVC DRAIN (DRAIN) IMPLANT
GLOVE BIO SURGEON STRL SZ7.5 (GLOVE) ×2 IMPLANT
GLOVE BIO SURGEON STRL SZ8 (GLOVE) ×2 IMPLANT
GLOVE BIOGEL PI IND STRL 7.5 (GLOVE) ×1 IMPLANT
GLOVE BIOGEL PI IND STRL 8 (GLOVE) ×1 IMPLANT
GLOVE BIOGEL PI INDICATOR 7.5 (GLOVE) ×1
GLOVE BIOGEL PI INDICATOR 8 (GLOVE) ×1
GOWN PREVENTION PLUS XLARGE (GOWN DISPOSABLE) ×2 IMPLANT
GOWN PREVENTION PLUS XXLARGE (GOWN DISPOSABLE) ×2 IMPLANT
GOWN STRL NON-REIN LRG LVL3 (GOWN DISPOSABLE) ×4 IMPLANT
HEMOSTAT ABSORBABLE VITASURE ×2 IMPLANT
KIT BASIN OR (CUSTOM PROCEDURE TRAY) ×2 IMPLANT
KIT ROOM TURNOVER OR (KITS) ×2 IMPLANT
LOOP VESSEL MAXI BLUE (MISCELLANEOUS) IMPLANT
MANIFOLD NEPTUNE II (INSTRUMENTS) ×2 IMPLANT
NS IRRIG 1000ML POUR BTL (IV SOLUTION) ×2 IMPLANT
PACK TOTAL JOINT (CUSTOM PROCEDURE TRAY) ×2 IMPLANT
PAD ARMBOARD 7.5X6 YLW CONV (MISCELLANEOUS) ×4 IMPLANT
PLATE PROX HUM R 7H 27X148MM (Plate) ×2 IMPLANT
PROS LCP PLATE 14 189M (Plate) ×2 IMPLANT
PROSTHESIS LCP PLATE 14 189M (Plate) ×1 IMPLANT
SCREW CORTEX 3.5 24MM (Screw) ×1 IMPLANT
SCREW CORTEX 3.5 28MM (Screw) ×1 IMPLANT
SCREW CORTEX 3.5 30MM (Screw) ×1 IMPLANT
SCREW CORTEX 3.5 32MM (Screw) ×1 IMPLANT
SCREW LOCK CORT ST 3.5X24 (Screw) ×1 IMPLANT
SCREW LOCK CORT ST 3.5X28 (Screw) ×1 IMPLANT
SCREW LOCK CORT ST 3.5X30 (Screw) ×1 IMPLANT
SCREW LOCK CORT ST 3.5X32 (Screw) ×1 IMPLANT
SCREW NONLOCK 3.5X24MM TI LNG (Screw) ×2 IMPLANT
SPONGE GAUZE 4X4 12PLY (GAUZE/BANDAGES/DRESSINGS) ×4 IMPLANT
SPONGE LAP 18X18 X RAY DECT (DISPOSABLE) ×2 IMPLANT
STAPLER VISISTAT 35W (STAPLE) ×2 IMPLANT
STOCKINETTE IMPERVIOUS LG (DRAPES) ×2 IMPLANT
SUCTION FRAZIER TIP 10 FR DISP (SUCTIONS) ×2 IMPLANT
SUT ETHIBOND 5 LR DA (SUTURE) ×2 IMPLANT
SUT FIBERWIRE #2 38 T-5 BLUE (SUTURE)
SUT PDS AB 2-0 CT1 27 (SUTURE) IMPLANT
SUT VIC AB 0 CT1 27 (SUTURE) ×2
SUT VIC AB 0 CT1 27XBRD ANBCTR (SUTURE) ×2 IMPLANT
SUT VIC AB 2-0 CT1 27 (SUTURE) ×2
SUT VIC AB 2-0 CT1 TAPERPNT 27 (SUTURE) ×2 IMPLANT
SUT VIC AB 2-0 CT3 27 (SUTURE) IMPLANT
SUTURE FIBERWR #2 38 T-5 BLUE (SUTURE) IMPLANT
SYR 5ML LL (SYRINGE) IMPLANT
TOWEL OR 17X24 6PK STRL BLUE (TOWEL DISPOSABLE) ×2 IMPLANT
TOWEL OR 17X26 10 PK STRL BLUE (TOWEL DISPOSABLE) ×4 IMPLANT
TRAY FOLEY CATH 14FR (SET/KITS/TRAYS/PACK) IMPLANT
WATER STERILE IRR 1000ML POUR (IV SOLUTION) ×2 IMPLANT
YANKAUER SUCT BULB TIP NO VENT (SUCTIONS) IMPLANT
screw locking 3.5 36 ×2 IMPLANT

## 2013-02-17 NOTE — Anesthesia Postprocedure Evaluation (Signed)
  Anesthesia Post-op Note  Patient: [name] [name]  Procedure(s) Performed: Procedure(s): OPEN REDUCTION INTERNAL FIXATION (ORIF) RIGHT PROXIMAL HUMERUS FRACTURE (Right)  Patient Location: PACU  Anesthesia Type:General  Level of Consciousness: awake  Airway and Oxygen Therapy: Patient Spontanous Breathing  Post-op Pain: mild  Post-op Assessment: Post-op Vital signs reviewed  Post-op Vital Signs: Reviewed  Complications: No apparent anesthesia complications

## 2013-02-17 NOTE — Progress Notes (Addendum)
[facility] PROGRESS NOTE  ADDELINE [name] YNW:[id number] DOB: Aug 23, 1941 DOA: 02/14/2013 PCP: Colette Ribas, MD  Assessment/Plan: 1. Encephalopathy: Resolved., probably related to UTI. Abdominal US negative for ascites. 2. UTI: Continue with Ceftriaxone day 4.  urine culture: E coli. Sensitive to ceftriaxone.   3. Hyponatremia: Resolved with IV fluids. 4. Hypokalemia: Resolved. 5. [name] [name] Cirrhosis: Bilirubin mildly increase on admission. Bilirubin at baseline. Patient was evaluated by Dr Madilyn Fireman, he explain to family risk for surgery. Patient at moderate to high risk for surgery. Patient and family  is willing to accept risk.  6. Diabetes: Hold medications patient NPO for surgery:  Linagliptin, Levemir, novolog.  7. Constipation: increase lactulose. Had BM 4-6 8. Right Humerus fracture: Patient with splint, Patient at moderate to high risk due to multiple comorbidity. ECHO with normal EF, no wall motion abnormalities. Telemetry reviewed no alarm. For surgery today.  9. Dizziness: improved. ? Vestibular deficits. Will order vestibular evaluation.  10. Vitamin D deficiency: She will need replacement when toleration oral.   Code Status: Full  Family Communication: none at bedside.  Disposition Plan: to be determine.    Consultants:  None  Procedures:  none  Antibiotics: Ceftriaxone 4-5  HPI/Subjective: Patient denies chest pain or dyspnea at rest or on exertion. She is willing to proceed with surgery. She understand that she is at moderate to high risk due to multiple comorbidity.   Objective: Filed Vitals:   02/16/13 2031 02/17/13 0500 02/17/13 0653 02/17/13 0800  BP: 122/53  120/56 119/53  Pulse: 87  80 88  Temp: 98.2 F (36.8 C)  98.4 F (36.9 C) 98.5 F (36.9 C)  TempSrc:      Resp: 16  18 16   Height:      Weight:  118.3 kg (260 lb 12.9 oz)    SpO2: 97%  98% 95%    Intake/Output Summary (Last 24 hours) at 02/17/13 0812 Last data filed at 02/17/13 0300   Gross per 24 hour  Intake    340 ml  Output    350 ml  Net    -10 ml   Filed Weights   02/14/13 1700 02/15/13 0500 02/17/13 0500  Weight: 108.863 kg (240 lb) 114 kg (251 lb 5.2 oz) 118.3 kg (260 lb 12.9 oz)    Exam:   General:  Awake, in no distress.   Cardiovascular: S 1, S 2 RRR  Respiratory: CTA  Abdomen: Bs present, soft.  Musculoskeletal: trace edema. right arm with splint, hematoma right arm..   Data Reviewed: Basic Metabolic Panel:  Recent Labs Lab 02/14/13 1200 02/15/13 0644 02/16/13 0627 02/17/13 0510  NA 133* 135 134* 137  K 3.3* 4.2 3.5 3.6  CL 100 103 102 104  CO2 22 23 24 26   GLUCOSE 309* 206* 250* 178*  BUN 14 21 17 13   CREATININE 0.70 0.88 0.68 0.55  CALCIUM 8.2* 8.3* 8.1* 7.9*  MG 2.1  --   --   --    Liver Function Tests:  Recent Labs Lab 02/14/13 1200 02/15/13 0644 02/16/13 0627  AST 20 17 15   ALT 9 7 7   ALKPHOS 74 69 60  BILITOT 3.6* 2.4* 2.1*  PROT 5.7* 5.3* 4.8*  ALBUMIN 3.0* 2.6* 2.3*   No results found for this basename: LIPASE, AMYLASE,  in the last 168 hours  Recent Labs Lab 02/14/13 1505  AMMONIA 45   CBC:  Recent Labs Lab 02/14/13 1200 02/15/13 0644 02/16/13 0627 02/17/13 0510  WBC 12.8* 7.1 5.0 4.3  NEUTROABS 9.5*  --   --   --   HGB 12.2 11.5* 9.8* 10.2*  HCT 33.8* 32.4* 27.2* 29.2*  MCV 81.4 81.4 80.2 82.3  PLT 145* PLATELETS APPEAR ADEQUATE 116* 122*   Cardiac Enzymes: No results found for this basename: CKTOTAL, CKMB, CKMBINDEX, TROPONINI,  in the last 168 hours BNP (last 3 results) No results found for this basename: PROBNP,  in the last 8760 hours CBG:  Recent Labs Lab 02/16/13 0630 02/16/13 1146 02/16/13 1617 02/16/13 2201 02/17/13 0702  GLUCAP 243* 234* 218* 281* 158*    Recent Results (from the past 240 hour(s))  URINE CULTURE     Status: None   Collection Time    02/14/13 11:56 AM      Result Value Range Status   Specimen Description URINE, CLEAN CATCH   Final   Special Requests  NONE   Final   Culture  Setup Time 02/14/2013 22:05   Final   Colony Count >=100,000 COLONIES/ML   Final   Culture ESCHERICHIA COLI   Final   Report Status 02/16/2013 FINAL   Final   Organism ID, Bacteria ESCHERICHIA COLI   Final  SURGICAL PCR SCREEN     Status: None   Collection Time    02/17/13 12:04 AM      Result Value Range Status   MRSA, PCR NEGATIVE  NEGATIVE Corrected   Staphylococcus aureus NEGATIVE  NEGATIVE Corrected   Comment:            The Xpert SA Assay (FDA     approved for NASAL specimens     in patients over 70 years of age),     is one component of     a comprehensive surveillance     program.  Test performance has     been validated by [employer] for patients greater     than or equal to 70 year old.     It is not intended     to diagnose infection nor to     guide or monitor treatment.     Studies: US Abdomen Limited  02/16/2013  *RADIOLOGY REPORT*  Clinical Data: Abdominal discomfort.  Assess for ascites.  History of cirrhosis and diabetes.  LIMITED ABDOMEN ULTRASOUND FOR ASCITES  Technique:  Limited ultrasound survey for ascites was performed in all four abdominal quadrants.  Comparison:  Abdominal ultrasound performed 08/29/2012  Findings: Evaluation of all four quadrants of the abdomen demonstrates no evidence for ascites.  No focal abnormalities are identified.  IMPRESSION: No ultrasonographic evidence of ascites within the abdomen.   Original Report Authenticated By: Tonia Ghent, M.D.     Scheduled Meds: . aspirin EC  81 mg Oral Daily  . budesonide-formoterol  2 puff Inhalation BID  .  ceFAZolin (ANCEF) IV  1 g Intravenous Once  . cefTRIAXone (ROCEPHIN)  IV  1 g Intravenous q morning - 10a  . clotrimazole   Topical BID  . docusate sodium  100 mg Oral Daily  . enalapril  5 mg Oral Daily  . fluticasone  1 spray Each Nare Daily  . insulin aspart  0-20 Units Subcutaneous TID WC  . insulin aspart  0-5 Units Subcutaneous QHS  . lactulose  30 g  Oral BID  . pantoprazole  80 mg Oral Q1200  . pregabalin  100 mg Oral QHS  . senna  1 tablet Oral QHS   Continuous Infusions:   Principal Problem:   Encephalopathy acute Active  Problems:   OBESITY   THROMBOCYTOPENIA, CHRONIC   CIRRHOSIS   Uncontrolled type 2 diabetes mellitus with complication   Peripheral neuropathy   Muscle weakness (generalized)   Fracture of humeral shaft, right, closed   UTI (urinary tract infection)   Hyponatremia   Hypokalemia    Time spent: 25 minutes.     Sharren Schnurr  [facility] Pager [phone]. If 7PM-7AM, please contact night-coverage at [email or web address], password [facility] 02/17/2013, 8:12 AM  LOS: 3 days

## 2013-02-17 NOTE — Brief Op Note (Signed)
02/14/2013 - 02/17/2013  5:20 PM  PATIENT:  [name] [name]  72 y.o. female  PRE-OPERATIVE DIAGNOSIS:  Right Proximal Humerus Fracture  POST-OPERATIVE DIAGNOSIS:  Right Proximal Humerus Fracture  PROCEDURE:  Procedure(s): OPEN REDUCTION INTERNAL FIXATION (ORIF) RIGHT PROXIMAL HUMERUS FRACTURE (Right)  SURGEON:  Surgeon(s) and Role:    * Budd Palmer, MD - Primary  PHYSICIAN ASSISTANT: Montez Morita, [facility]  ANESTHESIA:   general  EBL:  Total I/O In: 1742 [I.V.:500; Blood:992; IV Piggyback:250] Out: 800 [Blood:800]  DRAINS: none   LOCAL MEDICATIONS USED:  NONE  SPECIMEN:  No Specimen  DISPOSITION OF SPECIMEN:  N/A  COUNTS:  YES  TOURNIQUET:  * No tourniquets in log *  DICTATION: .Other Dictation: Dictation Number [id number]  PLAN OF CARE: Admit to inpatient   PATIENT DISPOSITION:  PACU - hemodynamically stable.   Delay start of Pharmacological VTE agent (>24hrs) due to surgical blood loss or risk of bleeding: yes

## 2013-02-17 NOTE — Progress Notes (Signed)
OT Cancellation Note  Patient Details Name: [name] [name] MRN: [id number] DOB: 05-Dec-1940   Cancelled Treatment:    Reason Eval/Treat Not Completed: Patient at procedure or test/ unavailable - pt awaiting transport to surgery.  Will reattempt tomorrow  Boykin Reaper [phone] 02/17/2013, 10:57 AM

## 2013-02-17 NOTE — Consult Note (Signed)
72 y/o RHD female with R proximal humerus fx admitted for acute encephalopathy and UTI  R proximal 1/3 humerus fracture  Given fracture location, pattern as well as pt factors, including body habitus and medical comorbidities she would greatly benefit from formal ORIF of her fracture. Pt is at increased risk for nonunion given her insulin dependent DM, cirrhosis and obesity  This fracture would be very difficult to tx non-operatively and would likely have a protracted course of healing, if it were to heal, and would likely go on to non-union and lead to a more difficult surgery and protracted loss of function.  I discussed with the patient the risks and benefits of surgery, including the possibility of infection, nerve injury, vessel injury, wound breakdown, arthritis, symptomatic hardware, DVT/ PE, loss of motion, and need for further surgery among others.  She understood these risks and wished to proceed, as did her husband with whom all of the above factors were discussed.  Myrene Galas, MD [facility] [phone] (p)

## 2013-02-17 NOTE — Transfer of Care (Signed)
Immediate Anesthesia Transfer of Care Note  Patient: [name] [name]  Procedure(s) Performed: Procedure(s): OPEN REDUCTION INTERNAL FIXATION (ORIF) RIGHT PROXIMAL HUMERUS FRACTURE (Right)  Patient Location: PACU  Anesthesia Type:GA combined with regional for post-op pain  Level of Consciousness: awake  Airway & Oxygen Therapy: Patient Spontanous Breathing and Patient connected to nasal cannula oxygen  Post-op Assessment: Report given to PACU RN and Post -op Vital signs reviewed and stable  Post vital signs: Reviewed and stable  Complications: No apparent anesthesia complications

## 2013-02-17 NOTE — Preoperative (Signed)
Beta Blockers   Reason not to administer Beta Blockers:Not Applicable 

## 2013-02-17 NOTE — Anesthesia Procedure Notes (Addendum)
Anesthesia Regional Block:  Interscalene brachial plexus block  Pre-Anesthetic Checklist: ,, timeout performed, Correct Patient, Correct Site, Correct Laterality, Correct Procedure, Correct Position, site marked, Risks and benefits discussed, pre-op evaluation,  At surgeon's request and post-op pain management  Laterality: Right  Prep: Maximum Sterile Barrier Precautions used and chloraprep       Needles:  Injection technique: Single-shot  Needle Type: Echogenic Stimulator Needle      Needle Gauge: 22 and 22 G    Additional Needles:  Procedures: ultrasound guided (picture in chart) and nerve stimulator Interscalene brachial plexus block  Nerve Stimulator or Paresthesia:  Response: Biceps response, 0.4 mA,   Additional Responses:   Narrative:  Start time: 02/17/2013 1:40 PM End time: 02/17/2013 1:51 PM Injection made incrementally with aspirations every 5 mL. Anesthesiologist: Sampson Goon, MD  Additional Notes: 2% Lidocaine skin wheel.   Interscalene brachial plexus block Procedure Name: Intubation Date/Time: 02/17/2013 2:09 PM Performed by: Elizbeth Squires R Pre-anesthesia Checklist: Patient identified, Emergency Drugs available, Suction available and Patient being monitored Patient Re-evaluated:Patient Re-evaluated prior to inductionOxygen Delivery Method: Circle system utilized Preoxygenation: Pre-oxygenation with 100% oxygen Intubation Type: IV induction Ventilation: Mask ventilation without difficulty and Oral airway inserted - appropriate to patient size Laryngoscope Size: Mac and 3 Grade View: Grade I Tube type: Oral Tube size: 7.0 mm Number of attempts: 1 Airway Equipment and Method: Stylet and LTA kit utilized Placement Confirmation: ETT inserted through vocal cords under direct vision,  positive ETCO2 and breath sounds checked- equal and bilateral Secured at: 19 cm Tube secured with: Tape Dental Injury: Teeth and Oropharynx as per pre-operative assessment

## 2013-02-17 NOTE — Anesthesia Preprocedure Evaluation (Addendum)
Anesthesia Evaluation  Patient identified by MRN, date of birth, ID band Patient awake    Reviewed: Allergy & Precautions, H&P , NPO status , Patient's Chart, lab work & pertinent test results  Airway Mallampati: III TM Distance: >3 FB Neck ROM: Full    Dental no notable dental hx. (+) Missing, Dental Advisory Given, Loose, Poor Dentition and Chipped,    Pulmonary neg pulmonary ROS, shortness of breath and with exertion, asthma ,  breath sounds clear to auscultation  Pulmonary exam normal       Cardiovascular hypertension, Pt. on medications + CAD negative cardio ROS  Rhythm:Regular Rate:Normal     Neuro/Psych PSYCHIATRIC DISORDERS Anxiety  Neuromuscular disease negative neurological ROS  negative psych ROS   GI/Hepatic negative GI ROS, Neg liver ROS, hiatal hernia, PUD, GERD-  Medicated and Poorly Controlled,  Endo/Other  negative endocrine ROSdiabetes, Poorly Controlled, Type obesity  Renal/GU negative Renal ROS  negative genitourinary   Musculoskeletal   Abdominal   Peds  Hematology negative hematology ROS (+)   Anesthesia Other Findings   Reproductive/Obstetrics negative OB ROS                          Anesthesia Physical Anesthesia Plan  ASA: III  Anesthesia Plan: General and Regional   Post-op Pain Management:    Induction: Intravenous  Airway Management Planned: Oral ETT  Additional Equipment:   Intra-op Plan:   Post-operative Plan: Extubation in OR  Informed Consent: I have reviewed the patients History and Physical, chart, labs and discussed the procedure including the risks, benefits and alternatives for the proposed anesthesia with the patient or authorized representative who has indicated his/her understanding and acceptance.   Dental advisory given  Plan Discussed with: CRNA, Anesthesiologist and Surgeon  Anesthesia Plan Comments:         Anesthesia  Quick Evaluation

## 2013-02-18 ENCOUNTER — Ambulatory Visit (HOSPITAL_COMMUNITY): Payer: Medicare Other | Admitting: *Deleted

## 2013-02-18 DIAGNOSIS — D62 Acute posthemorrhagic anemia: Secondary | ICD-10-CM | POA: Diagnosis not present

## 2013-02-18 DIAGNOSIS — S42309D Unspecified fracture of shaft of humerus, unspecified arm, subsequent encounter for fracture with routine healing: Secondary | ICD-10-CM

## 2013-02-18 LAB — CBC
HCT: 29.4 % — ABNORMAL LOW (ref 36.0–46.0)
MCH: 29.2 pg (ref 26.0–34.0)
MCV: 81.9 fL (ref 78.0–100.0)
Platelets: 146 10*3/uL — ABNORMAL LOW (ref 150–400)
RBC: 3.59 MIL/uL — ABNORMAL LOW (ref 3.87–5.11)
WBC: 7.7 10*3/uL (ref 4.0–10.5)

## 2013-02-18 LAB — TYPE AND SCREEN
ABO/RH(D): A NEG
Antibody Screen: NEGATIVE
Unit division: 0

## 2013-02-18 LAB — PREPARE FRESH FROZEN PLASMA: Unit division: 0

## 2013-02-18 LAB — GLUCOSE, CAPILLARY
Glucose-Capillary: 240 mg/dL — ABNORMAL HIGH (ref 70–99)
Glucose-Capillary: 213 mg/dL — ABNORMAL HIGH (ref 70–99)

## 2013-02-18 LAB — BASIC METABOLIC PANEL
BUN: 13 mg/dL (ref 6–23)
CO2: 24 mEq/L (ref 19–32)
Calcium: 7.8 mg/dL — ABNORMAL LOW (ref 8.4–10.5)
Chloride: 101 mEq/L (ref 96–112)
Creatinine, Ser: 0.61 mg/dL (ref 0.50–1.10)

## 2013-02-18 LAB — POCT I-STAT 4, (NA,K, GLUC, HGB,HCT)
Glucose, Bld: 199 mg/dL — ABNORMAL HIGH (ref 70–99)
HCT: 25 % — ABNORMAL LOW (ref 36.0–46.0)
Hemoglobin: 8.5 g/dL — ABNORMAL LOW (ref 12.0–15.0)

## 2013-02-18 MED ORDER — LINAGLIPTIN 5 MG PO TABS
5.0000 mg | ORAL_TABLET | Freq: Every day | ORAL | Status: DC
  Administered 2013-02-18 – 2013-02-19 (×2): 5 mg via ORAL
  Filled 2013-02-18 (×2): qty 1

## 2013-02-18 MED ORDER — OXYCODONE HCL 5 MG PO TABS
5.0000 mg | ORAL_TABLET | ORAL | Status: DC | PRN
  Administered 2013-02-18 (×3): 10 mg via ORAL
  Administered 2013-02-19: 5 mg via ORAL
  Administered 2013-02-19: 10 mg via ORAL
  Administered 2013-02-19: 5 mg via ORAL
  Filled 2013-02-18: qty 1
  Filled 2013-02-18 (×3): qty 2
  Filled 2013-02-18: qty 1
  Filled 2013-02-18: qty 2

## 2013-02-18 MED ORDER — INSULIN DETEMIR 100 UNIT/ML ~~LOC~~ SOLN
60.0000 [IU] | Freq: Every day | SUBCUTANEOUS | Status: DC
  Administered 2013-02-19: 60 [IU] via SUBCUTANEOUS
  Filled 2013-02-18 (×2): qty 0.6

## 2013-02-18 NOTE — Op Note (Signed)
[name], [name]                ACCOUNT NO.:  [id number]  MEDICAL RECORD NO.:  [id number]  LOCATION:  5N14C                        FACILITY:  [facility]  PHYSICIAN:  Doralee Albino. Carola Frost, M.D. DATE OF BIRTH:  02/19/1941  DATE OF PROCEDURE:  02/17/2013 DATE OF DISCHARGE:                              OPERATIVE REPORT   PREOPERATIVE DIAGNOSIS:  Displaced right proximal humeral fracture.  POSTOPERATIVE DIAGNOSIS:  Displaced right proximal humeral fracture.  PROCEDURE:  ORIF of right proximal humerus extending into the shaft.  SURGEON:  Doralee Albino. Carola Frost, M.D.  ASSISTANT:  Mearl Latin, [place]  ANESTHESIA:  General.  COMPLICATIONS:  None.  I/O:  1000 mL of crystalloid, 250 mL of albumin, 2 units PRBCs, 2 units FFP.  Out was 850 mL of hematoma that was evacuated on entry into the right arm and 800 mL of additional blood loss.  LOCAL:  None.  TOURNIQUET:  None.  DISPOSITION:  To PACU.  CONDITION:  Stable.  BRIEF SUMMARY OF INDICATION FOR PROCEDURE:  [name] [name] is a 72 year old female with a severely displaced right proximal humeral shaft fracture.  It was discussed with the patient and her husband preoperatively the risks and benefits of both nonsurgical and surgical management including the possibility of nonunion, malunion, heart attack, stroke, need for further surgery, nerve injury, vessel injury, wound problems, and many others which were elevated risk because of her significant comorbidities including cirrhosis.  The patient understood these risks as did her husband and wished to proceed with our recommendation for ORIF given the severity of the displacement and the potential for nonunion with a very difficult subsequent surgery in the event of failure should that take place plus loss of mobility in the interim.  BRIEF SUMMARY OF PROCEDURE:  Ms. Plant was taken to the operating room where general anesthesia was induced.  Her right upper extremity was prepped and draped in  usual sterile fashion and she was supine on a radiolucent table.  A standard deltopectoral approach was made to the arm.  The distal tip of the proximal humeral fragment had actually completely buttonholed through the fascia and was in the subcutaneous tissue.  There was a disruption of the long head of the biceps and 850 mL of hematoma that were evacuated.  The patient did have some mild bleeding and was controllable on entry to the fracture.  As we continued the exposure, we did not encounter any significant bleeding vessels; however, the patient continued to ooze from multiple sources and in particular, the intramedullary canal.  Reduction was challenging and did require [place] to fasten the plate to the proximal fragment initially and use this in order to control and achieve a reduction.  My assistant, Montez Morita, did assist with provisional stabilization and lag screw attempt was not successful and did not provide sufficient stabilization which elongated the procedure and made it considerably more difficult. Ultimately, we were able to achieve an anatomic reduction on both the AP and lateral.  We did lag the fracture site to the plate and achieved 6 cortices distally with both standard and locked fixation, and we also placed 2 locks and 1 standard proximally.  I did use  a variety of measures to help with blood loss including topical agents as recorded in the record.  We never encountered any significant bleeding vessels and the patient simply seemed to be coagulopathic and we were aggressive about maintaining resuscitation.  Catheterization at the end the case yielded approximately 500 mL.  She was taken to PACU in stable condition and did not experience any hemodynamic instability.  Montez Morita, PA-C, did assist me during the case and was necessary for its completion.  PROGNOSIS:  [name] will be in a bulky compressive dressing and a sling.  She will remain at elevated risk because of  her multiple comorbidities.  I do not intend to allow early weightbearing because of her bone quality and the potential for mental status changes that could make it difficult to adequately gauge the amount of weight that she is tolerating to the arm.  She will have no abduction against resistance, but will be allowed full forward flexion and extension of the shoulder and a full elbow motion.     Doralee Albino. Carola Frost, M.D.     MHH/MEDQ  D:  02/17/2013  T:  02/18/2013  Job:  [id number]

## 2013-02-18 NOTE — Clinical Social Work Psychosocial (Signed)
Clinical Social Work Department  BRIEF PSYCHOSOCIAL ASSESSMENT  Patient:[name] [name] Account Number: [id number]  Admit date: 02/14/2013 Clinical Social Worker Sabino Niemann, MSW Date/Time: 02/18/2013 11:00 AM Referred by: Physician Date Referred:  Referred for   SNF Placement   Other Referral:  Interview type: Patient and patient's husband Other interview type: PSYCHOSOCIAL DATA  Living Status:Husband Admitted from facility:  Level of care:  Primary support name: [name] [name] Primary support relationship to patient: Husband Degree of support available:  Strong and vested  CURRENT CONCERNS  Current Concerns   Post-Acute Placement   Other Concerns:  SOCIAL WORK ASSESSMENT / PLAN  CSW met with pt re: PT recommendation for SNF.   Pt lives with  With her husband in [place]  CSW explained placement process and answered questions.   Pt reports [facility] as her preference. However, Sw informed patient and her husband that no beds were available. They are both agreeable to a general search within the area  CSW completed FL2 and initiated SNF search.     Assessment/plan status: Information/Referral to [facility]  Other assessment/ plan:  Information/referral to community resources:  SNF   PTAR  PATIENT'S/FAMILY'S RESPONSE TO PLAN OF CARE:  Pt  reports she is agreeable to [facility] SNF in order to increase strength and independence with mobility prior to returning home  Pt verbalized understanding of placement process and appreciation for CSW assist.   Sabino Niemann, MSW [phone]

## 2013-02-18 NOTE — Clinical Social Work Placement (Addendum)
Clinical Social Work Department  CLINICAL SOCIAL WORK PLACEMENT NOTE  02/18/2013  Patient:  [name] [name] Account Number: [id number] Admit date: 02/14/2013  Clinical Social Worker: Sabino Niemann LCSWA Date/time: 4/9/201411:30 AM  Clinical Social Work is seeking post-discharge placement for this patient at the following level of care: SKILLED NURSING (*CSW will update this form in Epic as items are completed)  02/18/2013 Patient/family provided with [facility] Department of Clinical Social Work's list of facilities offering this level of care within the geographic area requested by the patient (or if unable, by the patient's family).  02/18/2013 Patient/family informed of their freedom to choose among providers that offer the needed level of care, that participate in Medicare, Medicaid or managed care program needed by the patient, have an available bed and are willing to accept the patient.  02/18/2013 Patient/family informed of [facility]' ownership interest in [facility], as well as of the fact that they are under no obligation to receive care at this facility.  PASARR submitted to EDS on pre-existing PASARR number received from EDS on pre-existing FL2 transmitted to all facilities in geographic area requested by pt/family on 02/18/2013  FL2 transmitted to all facilities within larger geographic area on  Patient informed that his/her managed care company has contracts with or will negotiate with certain facilities, including the following:  Patient/family informed of bed offers received: 02/19/13 Patient chooses bed at [facility] Physician recommends and patient chooses bed at  Patient to be transferred to on 02/19/2013 Patient to be transferred to facility by PTAR The following physician request were entered in Epic:  Additional Comments:  Sabino Niemann, MSW,  [phone]

## 2013-02-18 NOTE — Progress Notes (Signed)
[facility] Progress Note   1 Day Post-Op  Subjective   Tired but doing ok Pain overnight but doing better this am Not too hungry  Objective  BP 138/66  Pulse 102  Temp(Src) 98 F (36.7 C) (Oral)  Resp 18  Ht 5\' 8"  (1.727 m)  Wt 118.5 kg (261 lb 3.9 oz)  BMI 39.73 kg/m2  SpO2 95%  Intake/Output     04/08 0701 - 04/09 0700 04/09 0701 - 04/10 0700   P.O. 720    I.V. (mL/kg) 1500 (12.7)    Blood 1282    IV Piggyback 250    Total Intake(mL/kg) 3752 (31.7)    Urine (mL/kg/hr) 1250 (0.4)    Blood 800 (0.3)    Total Output 2050     Net +1702            Labs Results for VINCY, FELIZ (MRN [id number]) as of 02/18/2013 09:01  Ref. Range 02/18/2013 06:00 02/18/2013 06:45  Glucose-Capillary Latest Range: 70-99 mg/dL  409 (H)  Sodium Latest Range: 135-145 mEq/L 135   Potassium Latest Range: 3.5-5.1 mEq/L 4.1   Chloride Latest Range: 96-112 mEq/L 101   CO2 Latest Range: 19-32 mEq/L 24   BUN Latest Range: 6-23 mg/dL 13   Creatinine Latest Range: 0.50-1.10 mg/dL 8.11   Calcium Latest Range: 8.4-10.5 mg/dL 7.8 (L)   GFR calc non Af Amer Latest Range: >90 mL/min >90   GFR calc Af Amer Latest Range: >90 mL/min >90   Glucose Latest Range: 70-99 mg/dL 914 (H)   WBC Latest Range: 4.0-10.5 K/uL 7.7   RBC Latest Range: 3.87-5.11 MIL/uL 3.59 (L)   Hemoglobin Latest Range: 12.0-15.0 g/dL 78.2 (L)   HCT Latest Range: 36.0-46.0 % 29.4 (L)   MCV Latest Range: 78.0-100.0 fL 81.9   MCH Latest Range: 26.0-34.0 pg 29.2   MCHC Latest Range: 30.0-36.0 g/dL 95.6   RDW Latest Range: 11.5-15.5 % 15.0   Platelets Latest Range: 150-400 K/uL 146 (L)     Exam  Gen: stable but tired appearing Lungs: clear anterior fields Cardiac: s1 and s2 Abd:  + BS, NT Ext:      Right Upper Extremity  Dressing c/d/i  Swelling noted to hand, expected  Ace wrap stable  R/U/M/ax sensation intact  Ext warm, + Radial pulse  Sling on         Assessment and Plan  1 Day Post-Op  72 y/o RHD  female with R proximal humerus fx admitted for acute encephalopathy and UTI  1. Fall 2. R proximal 1/3 humerus fracture POD 1  Pt will be NWB R arm at least 4-6 weeks, given that she is walker dependent will likely need to use WC  PT/OT   Gentle shoulder pendulums for the next 2 weeks   Active and passive elbow, forearm, wrist and hand motion   Encourage active elbow, forearm, wrist and hand motion to help control swelling  Continue with ice and elevation  Sling for comfort only and when up mobilizing.  Sling can be off when in bed or chair              3. Medical issues               Per IM  Optimize sugar control to minimize post surgical complications goal for cbg<200  Pt on SSI for DM  Noted RN inquiry about Levemir insulin post op, did not see this on post op med rec, defer to IM.  4.  UTI             On Ceftriaxone for E. coli UTI   Day 4 of ceftriaxone   5. Encephalopathy             Resolved 6. DVT/PE prophylaxis             Defer pharmacologics to IM due to liver disease              Continue with mechanical pumps  7. Pain             Limit tylenol due to cirrhosis             Minimize narcs due to encephalopathy   Continue with oxy and morphine prn  No NSAIDs  8. ABL anemia  Received 2 units of PRBC's and FFP in OR   Blood loss related to fx and liver disease  No significant fluctuations from PACU h/h to this am's CBC  Continue to monitor  9. Dispo          therapies as tolerated  SW consult for SNF placement   Mearl Latin, PA-C [facility] [phone] (P) 02/18/2013 9:01 AM

## 2013-02-18 NOTE — Progress Notes (Addendum)
Occupational Therapy Treatment Patient Details Name: [name] [name] MRN: [id number] DOB: [birth year] Today's Date: 02/18/2013 Time: 2130-8657 OT Time Calculation (min): 19 min  OT Assessment / Plan / Recommendation Comments on Treatment Session Performed massage on hand/forearm as pt had swelling. Pt unable to stay awake during session. OT performed PROM to elbow, wrist, and pt able to perform AROM (very few reps) of hand to help decrease swelling. Also, elevated and iced RUE. Will f/u with pt tomorrow.    Follow Up Recommendations  SNF    Barriers to Discharge       Equipment Recommendations  3 in 1 bedside comode    Recommendations for Other Services    Frequency Min 2X/week   Plan Discharge plan remains appropriate    Precautions / Restrictions Precautions Precautions: Fall Required Braces or Orthoses: Other Brace/Splint Other Brace/Splint:  (right shoulder sling) Restrictions Weight Bearing Restrictions: Yes RUE Weight Bearing: Non weight bearing   Pertinent Vitals/Pain Pain 7/10 in Rt shoulder. Nurse notified and brought pain meds.     ADL  Equipment Used: Other (comment) (shoulder sling)      OT Goals Acute Rehab OT Goals OT Goal Formulation: With patient Time For Goal Achievement: 03/01/13 Potential to Achieve Goals: Good ADL Goals Pt Will Perform Grooming: with min assist;Unsupported;Sitting, edge of bed Pt Will Transfer to Toilet: with max assist;Stand pivot transfer;3-in-1 Miscellaneous OT Goals Miscellaneous OT Goal #1: Pt will independently use compensatory strategies during functional mobility to assist in minimizing dizziness. Miscellaneous OT Goal #2: Pt will demonstrate correct positioning of Right shoulder sling with min assist and caregiver independent in assisting. Miscellaneous OT Goal #3: Pt will perform static sitting balance task EOB >10 min with supervision as precursor for ADL retraining. Miscellaneous OT Goal #4: Pt will independently perform  HEP consisting of  AROM of elbow, wrist, and hand.  OT Goal: Miscellaneous Goal #4 - Progress: Goal set today  Visit Information  Last OT Received On: 02/18/13 Assistance Needed: +2    Subjective Data      Prior Functioning       Cognition  Cognition Overall Cognitive Status: Difficult to assess Difficult to assess due to: Level of arousal Arousal/Alertness: Lethargic Orientation Level:  (pt falling asleep throughout session) Behavior During Session: Lethargic    Mobility  Bed Mobility Bed Mobility: Not assessed    Exercises  Shoulder Exercises Elbow Flexion: PROM;10 reps;Right;Supine Elbow Extension: PROM;Right;10 reps;Supine Wrist Flexion: PROM;Right;10 reps;Supine Wrist Extension: 10 reps;Supine;PROM Digit Composite Flexion: AROM;Right;5 reps;Supine Composite Extension: AROM;Right;5 reps;Supine      End of Session    Lorri Frederick OTR/L [phone] 02/18/2013, 4:01 PM

## 2013-02-18 NOTE — Progress Notes (Signed)
[facility] PROGRESS NOTE  [name] [name] YNW:[id number] DOB: May 01, 1941 DOA: 02/14/2013 PCP: Colette Ribas, MD Brief narrative: 72 y/o obese female with hx of cirrhosis secondary to NASH, chronic b/l LE weakness, uncontrolled DM with neuropathy admitted for AMS likely in the  setting of UTI.   Assessment/Plan:  acute Encephalopathy:  Likely secondary to e coli UTI, now resolved. completes 5 days IV rocephin today.  Dehydration:  associated hyponatremia and hypokalemia. Now resolved wit IV fluids  Right humerus fracture Secondary to recent fall. Had splint on presentation. S/p ORIF on 4/8. Ortho recommends NWB of  right arm for 4-6 weeks.   since she would need a walker to ambulate , recommend her to be on wheelchair for now. Recommend sling can be removed at rest. continue ice and limb elevation.  Pain control with oxycodone and prn morphine   Cirrhosis secondary NASH LFTs currently stable. Seen by Dr Madilyn Fireman preop  Uncontrolled DM Will resume levemir and lingliptin Hb A1C of 8.1  Dizziness  concerned to be vestibular with unstable balance. PT recommends for SNF with need for occulomotor exercises.    CAD  Continue aspirin   Code Status:full code Family Communication: daughter at bedside Disposition Plan: SNF as early as 4/10   Consultants:  Orthopedics  [facility]   Procedure ORIF right humerus fracture on 4/8  Antibiotics:  IV rocephin day 5 /5  HPI/Subjective: Patient seen and examined.c/o pain over right shoulder surgical site. Had ORIF of right proximal humerus fracture last evening  Objective: Filed Vitals:   02/17/13 2052 02/17/13 2157 02/18/13 0100 02/18/13 0500  BP: 153/70 130/76 127/58 138/66  Pulse: 99 110 103 102  Temp: 98.1 F (36.7 C) 98.2 F (36.8 C) 98 F (36.7 C) 98 F (36.7 C)  TempSrc: Oral Oral Oral Oral  Resp: 18 18 17 18   Height:      Weight:   118.5 kg (261 lb 3.9 oz)   SpO2: 99% 95% 94% 95%    Intake/Output  Summary (Last 24 hours) at 02/18/13 0911 Last data filed at 02/18/13 0700  Gross per 24 hour  Intake   3752 ml  Output   2050 ml  Net   1702 ml   Filed Weights   02/15/13 0500 02/17/13 0500 02/18/13 0100  Weight: 114 kg (251 lb 5.2 oz) 118.3 kg (260 lb 12.9 oz) 118.5 kg (261 lb 3.9 oz)    Exam:   General: elderly obese female in NAD  HEENT: No pallor, moist mucosa  Cardiovascular: NS1&S2, no murmurs  Respiratory: clear b/l, no added sounds  Abdomen: soft, NT, ND, BS+  Musculoskeletal: dressing over rt  Humerus  with rt arm sling, moves fingers and wrist without difficulty. No edema  CNS: AAOX 3  Data Reviewed: Basic Metabolic Panel:  Recent Labs Lab 02/14/13 1200 02/15/13 0644 02/16/13 0627 02/17/13 0510 02/18/13 0600  NA 133* 135 134* 137 135  K 3.3* 4.2 3.5 3.6 4.1  CL 100 103 102 104 101  CO2 22 23 24 26 24   GLUCOSE 309* 206* 250* 178* 238*  BUN 14 21 17 13 13   CREATININE 0.70 0.88 0.68 0.55 0.61  CALCIUM 8.2* 8.3* 8.1* 7.9* 7.8*  MG 2.1  --   --   --   --    Liver Function Tests:  Recent Labs Lab 02/14/13 1200 02/15/13 0644 02/16/13 0627  AST 20 17 15   ALT 9 7 7   ALKPHOS 74 69 60  BILITOT 3.6* 2.4* 2.1*  PROT 5.7* 5.3* 4.8*  ALBUMIN 3.0* 2.6* 2.3*   No results found for this basename: LIPASE, AMYLASE,  in the last 168 hours  Recent Labs Lab 02/14/13 1505  AMMONIA 45   CBC:  Recent Labs Lab 02/14/13 1200 02/15/13 0644 02/16/13 0627 02/17/13 0510 02/17/13 1839 02/18/13 0600  WBC 12.8* 7.1 5.0 4.3  --  7.7  NEUTROABS 9.5*  --   --   --   --   --   HGB 12.2 11.5* 9.8* 10.2* 10.6* 10.5*  HCT 33.8* 32.4* 27.2* 29.2* 29.6* 29.4*  MCV 81.4 81.4 80.2 82.3  --  81.9  PLT 145* PLATELETS APPEAR ADEQUATE 116* 122*  --  146*   Cardiac Enzymes: No results found for this basename: CKTOTAL, CKMB, CKMBINDEX, TROPONINI,  in the last 168 hours BNP (last 3 results) No results found for this basename: PROBNP,  in the last 8760  hours CBG:  Recent Labs Lab 02/16/13 2201 02/17/13 0702 02/17/13 1132 02/17/13 2240 02/18/13 0645  GLUCAP 281* 158* 168* 248* 223*    Recent Results (from the past 240 hour(s))  URINE CULTURE     Status: None   Collection Time    02/14/13 11:56 AM      Result Value Range Status   Specimen Description URINE, CLEAN CATCH   Final   Special Requests NONE   Final   Culture  Setup Time 02/14/2013 22:05   Final   Colony Count >=100,000 COLONIES/ML   Final   Culture ESCHERICHIA COLI   Final   Report Status 02/16/2013 FINAL   Final   Organism ID, Bacteria ESCHERICHIA COLI   Final  SURGICAL PCR SCREEN     Status: None   Collection Time    02/17/13 12:04 AM      Result Value Range Status   MRSA, PCR NEGATIVE  NEGATIVE Corrected   Staphylococcus aureus NEGATIVE  NEGATIVE Corrected   Comment:            The Xpert SA Assay (FDA     approved for NASAL specimens     in patients over 33 years of age),     is one component of     a comprehensive surveillance     program.  Test performance has     been validated by [employer] for patients greater     than or equal to 57 year old.     It is not intended     to diagnose infection nor to     guide or monitor treatment.     Studies: Dg Humerus Right  02/17/2013  *RADIOLOGY REPORT*  Clinical Data: Postop ORIF right proximal humerus fracture  RIGHT HUMERUS - 2+ VIEW  Comparison: 02/17/2013  Findings: Plate screw fixation has been performed of the right proximal humerus.  Proximal humerus fracture lines remain visible. Anatomic alignment.  No hardware abnormality.  No joint abnormality.  IMPRESSION: Proximal right humerus fracture ORIF with anatomic alignment.   Original Report Authenticated By: Judie Petit. Shick, M.D.    Dg Humerus Right  02/17/2013  *RADIOLOGY REPORT*  Clinical Data: Humeral fracture.  ORIF.  DG C-ARM 61-120 MIN,RIGHT HUMERUS - 2+ VIEW  Comparison:  Radiographs 02/14/2013.  Findings: Four spot fluoroscopic images demonstrate  fixation of the proximal humeral diaphyseal fracture with a plate and screws.  The alignment appears near anatomic.  There is soft tissue emphysema adjacent to the fracture.  No complications are identified.  IMPRESSION: Intraoperative views during fixation  of proximal right humeral fracture.   Original Report Authenticated By: Carey Bullocks, M.D.    Dg C-arm [phone] Min  02/17/2013  *RADIOLOGY REPORT*  Clinical Data: Humeral fracture.  ORIF.  DG C-ARM 61-120 MIN,RIGHT HUMERUS - 2+ VIEW  Comparison:  Radiographs 02/14/2013.  Findings: Four spot fluoroscopic images demonstrate fixation of the proximal humeral diaphyseal fracture with a plate and screws.  The alignment appears near anatomic.  There is soft tissue emphysema adjacent to the fracture.  No complications are identified.  IMPRESSION: Intraoperative views during fixation of proximal right humeral fracture.   Original Report Authenticated By: Carey Bullocks, M.D.     Scheduled Meds: . aspirin EC  81 mg Oral Daily  . budesonide-formoterol  2 puff Inhalation BID  . cefTRIAXone (ROCEPHIN)  IV  1 g Intravenous q morning - 10a  . clotrimazole   Topical BID  . docusate sodium  100 mg Oral Daily  . enalapril  5 mg Oral Daily  . fluticasone  1 spray Each Nare Daily  . insulin aspart  0-20 Units Subcutaneous TID WC  . insulin aspart  0-5 Units Subcutaneous QHS  . lactulose  30 g Oral BID  . pantoprazole  80 mg Oral Q1200  . pregabalin  100 mg Oral QHS  . senna  1 tablet Oral QHS   Continuous Infusions: . sodium chloride 20 mL/hr (02/18/13 0118)      Time spent: 25 minutes    Abhishek Levesque  [facility] Pager [phone] If 7PM-7AM, please contact night-coverage at [email or web address], password [facility] 02/18/2013, 9:11 AM  LOS: 4 days

## 2013-02-18 NOTE — Progress Notes (Signed)
Physical Therapy Treatment Patient Details Name: [name] [name] MRN: [id number] DOB: 11/06/1941 Today's Date: 02/18/2013 Time: 0865-7846 PT Time Calculation (min): 19 min  PT Assessment / Plan / Recommendation Comments on Treatment Session  Pt limited in therapy today secondary to pain in R shoulder and dizziness. Pt required 2+ assist for sit <> stand transfers; unable to stand for more than 15 sec secondary to dizziness. Cont to recommend SNF upon acute D/C for cont. therapy to return to PLOF. Pt will benefit from cont skilled PT to improve mobility, increase indepenence with gt and increase stability/balance, to reduce risk of falls.    Follow Up Recommendations  SNF;Supervision/Assistance - 24 hour     Does the patient have the potential to tolerate intense rehabilitation     Barriers to Discharge        Equipment Recommendations  None recommended by PT    Recommendations for Other Services    Frequency     Plan      Precautions / Restrictions Precautions Precautions: Fall Required Braces or Orthoses: Other Brace/Splint Other Brace/Splint: right shoulder sling, shoulder splint Restrictions Weight Bearing Restrictions: Yes RUE Weight Bearing: Non weight bearing   Pertinent Vitals/Pain 10/10 when moving in R shoulder; pt premedicated; repositioned for comfort in bed.     Mobility  Bed Mobility Bed Mobility: Rolling Left;Left Sidelying to Sit;Sit to Supine;Sitting - Scoot to Edge of Bed Rolling Left: 4: Min assist;With rail (required tactile and verbal cues for hand placement ) Left Sidelying to Sit: 3: Mod assist;With rails;HOB elevated (to advance LEs off EOB and assist trunk to seated position ) Sitting - Scoot to Edge of Bed: 3: Mod assist;With rail Sit to Supine: HOB elevated;With rail;2: Max assist (to advance LEs and trunk to supine position;) Details for Bed Mobility Assistance: pt required vc's to maintain NWB status on R UE; c/o pain with activity; required tactile  and vc's for hand placement and sequencing. Pt required assistance to advance LEs onto/off EOB and to position trunk in proper position.  Pt tolerated sitting EOB ~ 5 min c/o dizziness  Transfers Transfers: Sit to Stand;Stand to Sit Sit to Stand: 1: +2 Total assist;From bed;With upper extremity assist;From elevated surface Sit to Stand: Patient Percentage: 70% Stand to Sit: 1: +2 Total assist;To elevated surface;Without upper extremity assist;To bed Stand to Sit: Patient Percentage: 80% Details for Transfer Assistance: pt required multimodal cues for hand placement; max encouragement to perform sit <> stand transfer; required assistance to steady and balance; demo anterior lean; multimodal cues for upright posture; unable to perform static standing without assistance Ambulation/Gait Ambulation/Gait Assistance: Not tested (comment);Other (comment) (pt refused to attempt ambulation today secondary to pain) Stairs: No Wheelchair Mobility Wheelchair Mobility: No    Exercises     PT Diagnosis:    PT Problem List:   PT Treatment Interventions:     PT Goals Acute Rehab PT Goals PT Goal Formulation: With patient Time For Goal Achievement: 03/01/13 Potential to Achieve Goals: Good PT Goal: Supine/Side to Sit - Progress: Progressing toward goal PT Goal: Sit to Supine/Side - Progress: Progressing toward goal PT Goal: Sit to Stand - Progress: Progressing toward goal  Visit Information  Last PT Received On: 02/18/13 Assistance Needed: +2    Subjective Data  Subjective: Reports having dizziness and lots of pain today. Agreeable to therapy Patient Stated Goal: to not be dizzy    Cognition  Cognition Overall Cognitive Status: Impaired Arousal/Alertness: Other (comment) (required vc's to open eyes engage  in session) Orientation Level: Appears intact for tasks assessed Behavior During Session: [facility] for tasks performed Memory Deficits: difficult recalling WB precautions on R UE Cognition -  Other Comments: pt stated she kept her eyes closed secondary to dizziness; required multimodal cues to open eyes during session.     Balance  Balance Balance Assessed: Yes Static Sitting Balance Static Sitting - Balance Support: Bilateral upper extremity supported;Feet supported (required tactile cues for UE support ) Static Sitting - Level of Assistance: 4: Min assist Static Sitting - Comment/# of Minutes: pt required multimodal cues for upright posterior; demo posterior lateral lean to left; c/o dizziness and pain Static Standing Balance Static Standing - Balance Support: Bilateral upper extremity supported;During functional activity Static Standing - Level of Assistance: 1: +2 Total assist Static Standing - Comment/# of Minutes: able to stand < 15 sec; c/o dizziness and wanted to lay down  End of Session PT - End of Session Equipment Utilized During Treatment: Gait belt Activity Tolerance: Patient limited by pain;Other (comment) (limited by dizziness) Patient left: in bed;with call bell/phone within reach;with bed alarm set Nurse Communication: Mobility status   GP     Donell Sievert, [place] [phone] 02/18/2013, 3:20 PM

## 2013-02-19 DIAGNOSIS — R29898 Other symptoms and signs involving the musculoskeletal system: Secondary | ICD-10-CM

## 2013-02-19 DIAGNOSIS — R531 Weakness: Secondary | ICD-10-CM | POA: Diagnosis present

## 2013-02-19 LAB — VITAMIN D 25 HYDROXY (VIT D DEFICIENCY, FRACTURES): Vit D, 25-Hydroxy: 23 ng/mL — ABNORMAL LOW (ref 30–89)

## 2013-02-19 MED ORDER — HYDROCODONE-ACETAMINOPHEN 10-325 MG PO TABS: 1.0000 | ORAL_TABLET | ORAL | Status: DC | PRN

## 2013-02-19 MED ORDER — DSS 100 MG PO CAPS: 100.0000 mg | ORAL_CAPSULE | Freq: Every day | ORAL | Status: DC

## 2013-02-19 NOTE — Progress Notes (Signed)
Occupational Therapy Treatment Patient Details Name: [name] [name] MRN: [id number] DOB: [birth year] Today's Date: 02/19/2013 Time: 4098-1191 OT Time Calculation (min): 29 min  OT Assessment / Plan / Recommendation Comments on Treatment Session Pt continues with moderate edema Rt. UE.  AROM/AAROM elbow, wrist and hand performed as well as retrograde massage, elevation and ice applied.  Ace wrap removed and re-applied with gentle compression.  Pt continues with lethargy making it difficult for her to fully participate, but it is better than yesterday.   Pt unable to perform pendulum exercises safely    Follow Up Recommendations  SNF    Barriers to Discharge       Equipment Recommendations  3 in 1 bedside comode    Recommendations for Other Services    Frequency Min 2X/week   Plan Discharge plan remains appropriate    Precautions / Restrictions Precautions Precautions: Fall Precaution Comments: Rt. shoulder gentle pendulums, no rotation; elbow, wrist and hand ROM Required Braces or Orthoses: Other Brace/Splint Other Brace/Splint: right shoulder sling, shoulder splint (when up; may be off when lying or seated) Restrictions Weight Bearing Restrictions: Yes RUE Weight Bearing: Non weight bearing   Pertinent Vitals/Pain     ADL  ADL Comments: Pt lethargic today.  Able to assist with Elbow, wrist and hand exercises with max verbal cues to maintain arrousal.  Retrograde massage performed for edema management.  Ace wrap removed and rewrapped to prevent uneven compression.   Rt. UE elevated and ice applied    OT Diagnosis:    OT Problem List:   OT Treatment Interventions:     OT Goals Acute Rehab OT Goals Time For Goal Achievement: 03/01/13 Miscellaneous OT Goals OT Goal: Miscellaneous Goal #2 - Progress: Progressing toward goals OT Goal: Miscellaneous Goal #4 - Progress: Progressing toward goals  Visit Information  Last OT Received On: 02/19/13 Assistance Needed: +2     Subjective Data  Subjective: "I just can't stay awake"   Prior Functioning       Cognition  Cognition Overall Cognitive Status: Difficult to assess (due to lethargy) Difficult to assess due to: Level of arousal Arousal/Alertness: Lethargic Orientation Level: Appears intact for tasks assessed Behavior During Session: Lethargic    Mobility       Exercises  Shoulder Exercises Elbow Flexion: AAROM;Right;10 reps;Supine Elbow Extension: AAROM;Right;10 reps;Supine Wrist Flexion: AAROM;Right;10 reps;Supine Wrist Extension: AAROM;Right;10 reps;Supine Digit Composite Flexion: Right;10 reps;AAROM;AROM;Supine Composite Extension: AROM;AAROM;Right;10 reps;Supine Pendulum exercises (written home exercise program):  (Pt unable to safely) ROM for elbow, wrist and digits of operated UE: Moderate assistance Sling wearing schedule (on at all times/off for ADL's): Minimal assistance Proper positioning of operated UE when showering: Moderate assistance   Balance     End of Session OT - End of Session Activity Tolerance: Patient limited by fatigue Patient left: in bed;with call bell/phone within reach;with family/visitor present  GO     Kensly Bowmer, Ursula Alert M 02/19/2013, 11:27 AM

## 2013-02-19 NOTE — Progress Notes (Signed)
Clinical social worker assisted with patient discharge to skilled nursing facility, [facility].  CSW addressed all family questions and concerns. CSW copied chart and added all important documents. CSW also set up patient transportation with [employer]. Clinical Social Worker will sign off for now as social work intervention is no longer needed.  Sabino Niemann, MSW, [phone]

## 2013-02-19 NOTE — Progress Notes (Signed)
D/C to SNF via stretcher in stable condition.

## 2013-02-19 NOTE — Progress Notes (Signed)
I saw and examined the patient with Mr. Paul, communicating the findings and plan noted above.  Esai Stecklein, MD [facility] [phone] (p)  

## 2013-02-19 NOTE — Discharge Summary (Addendum)
Physician Discharge Summary  [name] [name] ZOX:[id number] DOB: [birth year] DOA: 02/14/2013  PCP: Colette Ribas, MD  Admit date: 02/14/2013 Discharge date: 02/19/2013  Time spent:40 minutes  Recommendations for Outpatient Follow-up:  1. D/c to SNF with outpt ortho followup  Discharge Diagnoses:   Principal Problem:   Acute Encephalopathy secondary to UTI and dehydration  Active Problems:   Fracture of humeral shaft, right, closed   UTI (urinary tract infection)  Dizziness    Obesity    thrombocytopenia   Cirrhosis secondary to NASH   Uncontrolled type 2 diabetes mellitus with complication   Peripheral neuropathy   Hyponatremia   Hypokalemia   Acute blood loss anemia   Generalized weakness   Discharge Condition: fair  Diet recommendation: diabetic  Filed Weights   02/17/13 0500 02/18/13 0100 02/19/13 8119  Weight: 118.3 kg (260 lb 12.9 oz) 118.5 kg (261 lb 3.9 oz) 121.11 kg (267 lb)    History of present illness:  Please refer to admission H&P for details, but in brief,72 y/o obese female with hx of cirrhosis secondary to NASH, chronic b/l LE weakness, uncontrolled DM with neuropathy admitted for AMS likely in the setting of UTI. Also had recent fall with right humerus fracture.    Hospital Course:   acute Encephalopathy:  Likely secondary to e coli UTI, now resolved. completes 5 days IV rocephin on 4/9  Dehydration:  associated hyponatremia and hypokalemia. Now resolved with IV fluids and kcl supplementation.  Right humerus fracture  Secondary to recent fall.  Had splint on presentation. S/p ORIF on 4/8. Ortho recommends NWB of right arm for 4-6 weeks. since she would need a walker to ambulate , recommend her to be on wheelchair for now. Recommend sling can be removed at rest. continue ice and limb elevation.  Pain control with prn vicodin.  Will follow up with ortho in 1 week.  Cirrhosis secondary NASH  LFTs currently stable. Seen by Dr Madilyn Fireman preop    Uncontrolled DM  Continue  levemir and lingliptin  Hb A1C of 8.1   Dizziness with generalized weakness concerned to be vestibular with unstable balance. PT recommends for SNF with need for occulomotor exercises.   CAD  Continue aspirin    Patient is clinically stable and will be discharged to SNF for further PT needs . As recommended by ortho, she will be non weight bearing for 4-6 weeks and wheelchair bound as she cannot use a walker due to rt humerus fracture repair.   Code Status:full code   Family Communication: daughter aware of plan  Disposition Plan: SNF    Procedures:  ORIF right humerus on 4/8  Consultations:  Dr Carola Frost ( ortho)  Discharge Exam: Filed Vitals:   02/18/13 2307 02/19/13 0608 02/19/13 0716 02/19/13 0832  BP:   140/69   Pulse:   104   Temp:   98.6 F (37 C)   TempSrc:      Resp:   18   Height:      Weight:  121.11 kg (267 lb)    SpO2: 92%  99% 94%   General: elderly obese female in NAD  HEENT: No pallor, moist mucosa  Cardiovascular: NS1&S2, no murmurs  Respiratory: clear b/l, no added sounds  Abdomen: soft, NT, ND, BS+  Musculoskeletal: dressing over rt Humerus with rt arm sling, moves fingers and wrist without difficulty. No edema  CNS: AAOX 3   Discharge Instructions     Medication List    STOP taking these medications  ALEVE 220 MG tablet  Generic drug:  naproxen sodium     oxyCODONE 5 MG immediate release tablet  Commonly known as:  Oxy IR/ROXICODONE      TAKE these medications       ALPRAZolam 0.5 MG tablet  Commonly known as:  XANAX  Take 0.5 mg by mouth 3 (three) times daily as needed for anxiety.     BAYER LOW STRENGTH 81 MG EC tablet  Generic drug:  aspirin  Take 81 mg by mouth daily.     budesonide-formoterol 160-4.5 MCG/ACT inhaler  Commonly known as:  SYMBICORT  Inhale 2 puffs into the lungs 2 (two) times daily.     CENTRUM SILVER PO  Take 1 tablet by mouth daily.     clotrimazole 1 % cream   Commonly known as:  LOTRIMIN  Apply topically 2 (two) times daily.     DSS 100 MG Caps  Take 100 mg by mouth daily.     enalapril 5 MG tablet  Commonly known as:  VASOTEC  Take 5 mg by mouth daily.     esomeprazole 40 MG capsule  Commonly known as:  NEXIUM  Take 40 mg by mouth daily before breakfast.     HYDROcodone-acetaminophen 10-325 MG per tablet  Commonly known as:  NORCO  Take 1 tablet by mouth every 4 (four) hours as needed for pain.     insulin aspart 100 UNIT/ML injection  Commonly known as:  novoLOG  Inject 10-40 Units into the skin daily as needed. Per sliding scale. Patient injects 10-40 units for levels above 150 to 300     insulin detemir 100 UNIT/ML injection  Commonly known as:  LEVEMIR  Inject 60 Units into the skin at bedtime.     lactulose 10 GM/15ML solution  Commonly known as:  CHRONULAC  Take 60 mLs by mouth daily.     meclizine 25 MG tablet  Commonly known as:  ANTIVERT  Take 25 mg by mouth 3 (three) times daily as needed for dizziness or nausea.     pregabalin 100 MG capsule  Commonly known as:  LYRICA  Take 100 mg by mouth 2 (two) times daily.     TRADJENTA 5 MG Tabs tablet  Generic drug:  linagliptin  Take 5 mg by mouth daily.           Follow-up Information   Follow up with Colette Ribas, MD In 2 weeks.   Contact information:   [address] [place]  [phone]       Follow up with Budd Palmer, MD In 1 week. (office will call)    Contact information:   [address] [place] [address] [phone]        The results of significant diagnostics from this hospitalization (including imaging, microbiology, ancillary and laboratory) are listed below for reference.    Significant Diagnostic Studies: Dg Chest 1 View  02/14/2013  *RADIOLOGY REPORT*  Clinical Data: Weakness and recent right humeral fracture.  CHEST - 1 VIEW  Comparison:  11/28/2012  Findings: Lung volumes are low and the heart is moderately enlarged.  There is some prominence of pulmonary vascularity without overt edema.  No pleural fluid or pneumothorax.  IMPRESSION: Low lung volumes and cardiomegaly.   Original Report Authenticated By: Irish Lack, M.D.    Ct Head Wo Contrast  02/11/2013  *RADIOLOGY REPORT*  Clinical Data:  Unwitnessed fall.  Dizziness with  headache and right shoulder pain.  CT HEAD WITHOUT CONTRAST CT CERVICAL SPINE WITHOUT CONTRAST  Technique:  Multidetector CT imaging of the head and cervical spine was performed following the standard protocol without intravenous contrast.  Multiplanar CT image reconstructions of the cervical spine were also generated.  Comparison:  Prior examinations 12/04/2012.  CT HEAD  Findings: There is no evidence of acute intracranial hemorrhage, mass effect, brain edema or extra-axial fluid collection.  The ventricles and subarachnoid spaces are diffusely prominent consistent with  atrophy.  No acute cortical infarction is seen.  The visualized paranasal sinuses are clear. There is a stable lucent lesion within the right parietal bone on image 61.  No acute osseous findings are identified.  IMPRESSION: Stable examination.  No acute intracranial or calvarial findings.  CT CERVICAL SPINE  Findings: As before, bone detail is limited inferiorly secondary to body habitus.  The alignment is normal.  There is no evidence of acute fracture or traumatic subluxation.  There is stable spondylosis with posterior osteophytes and uncinate spurring at the C5-C6 and C6-C7 levels.  No acute soft tissue abnormalities are identified.  IMPRESSION: Stable examination demonstrating no evidence of acute fracture, subluxation or static signs of instability.  Similar spondylosis.   Original Report Authenticated By: Carey Bullocks, M.D.    Ct Cervical Spine Wo Contrast  02/11/2013  *RADIOLOGY REPORT*  Clinical Data:  Unwitnessed fall.  Dizziness with  headache and right shoulder pain.  CT HEAD WITHOUT CONTRAST CT CERVICAL SPINE WITHOUT CONTRAST  Technique:  Multidetector CT imaging of the head and cervical spine was performed following the standard protocol without intravenous contrast.  Multiplanar CT image reconstructions of the cervical spine were also generated.  Comparison:  Prior examinations 12/04/2012.  CT HEAD  Findings: There is no evidence of acute intracranial hemorrhage, mass effect, brain edema or extra-axial fluid collection.  The ventricles and subarachnoid spaces are diffusely prominent consistent with  atrophy.  No acute cortical infarction is seen.  The visualized paranasal sinuses are clear. There is a stable lucent lesion within the right parietal bone on image 61.  No acute osseous findings are identified.  IMPRESSION: Stable examination.  No acute intracranial or calvarial findings.  CT CERVICAL SPINE  Findings: As before, bone detail is limited inferiorly secondary to body habitus.  The alignment is normal.  There is no evidence of acute fracture or traumatic subluxation.  There is stable spondylosis with posterior osteophytes and uncinate spurring at the C5-C6 and C6-C7 levels.  No acute soft tissue abnormalities are identified.  IMPRESSION: Stable examination demonstrating no evidence of acute fracture, subluxation or static signs of instability.  Similar spondylosis.   Original Report Authenticated By: Carey Bullocks, M.D.    US Abdomen Limited  02/16/2013  *RADIOLOGY REPORT*  Clinical Data: Abdominal discomfort.  Assess for ascites.  History of cirrhosis and diabetes.  LIMITED ABDOMEN ULTRASOUND FOR ASCITES  Technique:  Limited ultrasound survey for ascites was performed in all four abdominal quadrants.  Comparison:  Abdominal ultrasound performed 08/29/2012  Findings: Evaluation of all four quadrants of the abdomen demonstrates no evidence for ascites.  No focal abnormalities are identified.  IMPRESSION: No ultrasonographic evidence  of ascites within the abdomen.   Original Report Authenticated By: Tonia Ghent, M.D.    Dg Humerus Right  02/17/2013  *RADIOLOGY REPORT*  Clinical Data: Postop ORIF right proximal humerus fracture  RIGHT HUMERUS - 2+ VIEW  Comparison: 02/17/2013  Findings: Plate screw fixation has been performed of the right proximal humerus.  Proximal humerus fracture lines remain visible. Anatomic alignment.  No hardware abnormality.  No joint abnormality.  IMPRESSION: Proximal right humerus fracture ORIF with anatomic alignment.   Original Report Authenticated By: Judie Petit. Shick, M.D.    Dg Humerus Right  02/17/2013  *RADIOLOGY REPORT*  Clinical Data: Humeral fracture.  ORIF.  DG C-ARM 61-120 MIN,RIGHT HUMERUS - 2+ VIEW  Comparison:  Radiographs 02/14/2013.  Findings: Four spot fluoroscopic images demonstrate fixation of the proximal humeral diaphyseal fracture with a plate and screws.  The alignment appears near anatomic.  There is soft tissue emphysema adjacent to the fracture.  No complications are identified.  IMPRESSION: Intraoperative views during fixation of proximal right humeral fracture.   Original Report Authenticated By: Carey Bullocks, M.D.    Dg Humerus Right  02/14/2013  *RADIOLOGY REPORT*  Clinical Data: Increased pain at site of recent humeral fracture.  RIGHT HUMERUS - 2+ VIEW  Comparison: 02/11/2013  Findings: The proximal humeral shaft fracture demonstrates similar degree of displacement and angulation since the prior study with maximal displacement of approximately a full shaft width.  No associated dislocation.  Stable soft tissue prominence.  IMPRESSION: Similar displacement and angulation at the level of the proximal shaft fracture of the right humerus.   Original Report Authenticated By: Irish Lack, M.D.    Dg Humerus Right  02/11/2013  *RADIOLOGY REPORT*  Clinical Data: Fall.  RIGHT HUMERUS - 2+ VIEW  Comparison: None.  Findings: There is an oblique fracture of the proximal shaft of the humerus  about 6 cm distal to the surgical neck.  The principle distal fragment is displaced anteriorly and medially greater than 50%. It also is angulated medially about 20 degrees.  There is no additional evidence of acute skeletal trauma.  The shoulder joint and the elbow joint are normally maintained.  IMPRESSION: Displaced fracture of the proximal right humerus.   Original Report Authenticated By: Sander Radon, M.D.    Dg C-arm [phone] Min  02/17/2013  *RADIOLOGY REPORT*  Clinical Data: Humeral fracture.  ORIF.  DG C-ARM 61-120 MIN,RIGHT HUMERUS - 2+ VIEW  Comparison:  Radiographs 02/14/2013.  Findings: Four spot fluoroscopic images demonstrate fixation of the proximal humeral diaphyseal fracture with a plate and screws.  The alignment appears near anatomic.  There is soft tissue emphysema adjacent to the fracture.  No complications are identified.  IMPRESSION: Intraoperative views during fixation of proximal right humeral fracture.   Original Report Authenticated By: Carey Bullocks, M.D.     Microbiology: Recent Results (from the past 240 hour(s))  URINE CULTURE     Status: None   Collection Time    02/14/13 11:56 AM      Result Value Range Status   Specimen Description URINE, CLEAN CATCH   Final   Special Requests NONE   Final   Culture  Setup Time 02/14/2013 22:05   Final   Colony Count >=100,000 COLONIES/ML   Final   Culture ESCHERICHIA COLI   Final   Report Status 02/16/2013 FINAL   Final   Organism ID, Bacteria ESCHERICHIA COLI   Final  SURGICAL PCR SCREEN     Status: None   Collection Time    02/17/13 12:04 AM      Result Value Range Status   MRSA, PCR NEGATIVE  NEGATIVE Corrected   Staphylococcus aureus NEGATIVE  NEGATIVE Corrected   Comment:            The Xpert SA Assay (FDA     approved for NASAL specimens  in patients over 37 years of age),     is one component of     a comprehensive surveillance     program.  Test performance has     been validated by [employer]  for patients greater     than or equal to 65 year old.     It is not intended     to diagnose infection nor to     guide or monitor treatment.     Labs: Basic Metabolic Panel:  Recent Labs Lab 02/14/13 1200 02/15/13 0644 02/16/13 0627 02/17/13 0510 02/17/13 1600 02/18/13 0600  NA 133* 135 134* 137 136 135  K 3.3* 4.2 3.5 3.6 4.8 4.1  CL 100 103 102 104  --  101  CO2 22 23 24 26   --  24  GLUCOSE 309* 206* 250* 178* 199* 238*  BUN 14 21 17 13   --  13  CREATININE 0.70 0.88 0.68 0.55  --  0.61  CALCIUM 8.2* 8.3* 8.1* 7.9*  --  7.8*  MG 2.1  --   --   --   --   --    Liver Function Tests:  Recent Labs Lab 02/14/13 1200 02/15/13 0644 02/16/13 0627  AST 20 17 15   ALT 9 7 7   ALKPHOS 74 69 60  BILITOT 3.6* 2.4* 2.1*  PROT 5.7* 5.3* 4.8*  ALBUMIN 3.0* 2.6* 2.3*   No results found for this basename: LIPASE, AMYLASE,  in the last 168 hours  Recent Labs Lab 02/14/13 1505  AMMONIA 45   CBC:  Recent Labs Lab 02/14/13 1200 02/15/13 0644 02/16/13 0627 02/17/13 0510 02/17/13 1600 02/17/13 1839 02/18/13 0600  WBC 12.8* 7.1 5.0 4.3  --   --  7.7  NEUTROABS 9.5*  --   --   --   --   --   --   HGB 12.2 11.5* 9.8* 10.2* 8.5* 10.6* 10.5*  HCT 33.8* 32.4* 27.2* 29.2* 25.0* 29.6* 29.4*  MCV 81.4 81.4 80.2 82.3  --   --  81.9  PLT 145* PLATELETS APPEAR ADEQUATE 116* 122*  --   --  146*   Cardiac Enzymes: No results found for this basename: CKTOTAL, CKMB, CKMBINDEX, TROPONINI,  in the last 168 hours BNP: BNP (last 3 results) No results found for this basename: PROBNP,  in the last 8760 hours CBG:  Recent Labs Lab 02/18/13 0645 02/18/13 1157 02/18/13 1653 02/18/13 2306 02/19/13 0701  GLUCAP 223* 240* 259* 213* 130*       Signed:  DHUNGEL, NISHANT  [facility] 02/19/2013, 10:04 AM     Please call ASAP [phone] to schedule orthopaedic follow up appointment in 7-10 days  Mearl Latin, PA-C [facility] [phone]  (P) 02/19/2013 2:14 PM

## 2013-02-20 ENCOUNTER — Ambulatory Visit (HOSPITAL_COMMUNITY): Payer: Medicare Other | Admitting: Physical Therapy

## 2013-02-20 ENCOUNTER — Encounter (HOSPITAL_COMMUNITY): Payer: Self-pay | Admitting: Orthopedic Surgery

## 2013-02-20 ENCOUNTER — Encounter: Payer: Self-pay | Admitting: Internal Medicine

## 2013-02-20 LAB — VITAMIN D 1,25 DIHYDROXY: Vitamin D2 1, 25 (OH)2: 54 pg/mL

## 2013-02-23 ENCOUNTER — Ambulatory Visit (HOSPITAL_COMMUNITY): Payer: Medicare Other | Admitting: Physical Therapy

## 2013-02-23 LAB — VITAMIN D 1,25 DIHYDROXY
Vitamin D2 1, 25 (OH)2: 30 pg/mL
Vitamin D3 1, 25 (OH)2: 20 pg/mL

## 2013-02-25 ENCOUNTER — Encounter: Payer: Self-pay | Admitting: Internal Medicine

## 2013-02-25 ENCOUNTER — Telehealth: Payer: Self-pay | Admitting: Internal Medicine

## 2013-02-25 NOTE — Telephone Encounter (Signed)
Pt is not due for AFP- per 2/14 lab results

## 2013-02-25 NOTE — Telephone Encounter (Signed)
Patient has had an Abd limited U/S on 02/15/13

## 2013-02-25 NOTE — Telephone Encounter (Signed)
Pt is on April recall to have AFP and ultrasound done prior to OV

## 2013-03-20 ENCOUNTER — Emergency Department (HOSPITAL_COMMUNITY)
Admission: EM | Admit: 2013-03-20 | Discharge: 2013-03-21 | Disposition: A | Discharge status: Home / Self Care | Payer: Medicare Other | Attending: Emergency Medicine | Admitting: Emergency Medicine

## 2013-03-20 ENCOUNTER — Encounter (HOSPITAL_COMMUNITY): Payer: Self-pay | Admitting: *Deleted

## 2013-03-20 ENCOUNTER — Emergency Department (HOSPITAL_COMMUNITY): Payer: Medicare Other

## 2013-03-20 DIAGNOSIS — Z8669 Personal history of other diseases of the nervous system and sense organs: Secondary | ICD-10-CM | POA: Insufficient documentation

## 2013-03-20 DIAGNOSIS — Z860100 Personal history of colon polyps, unspecified: Secondary | ICD-10-CM | POA: Insufficient documentation

## 2013-03-20 DIAGNOSIS — R5383 Other fatigue: Secondary | ICD-10-CM | POA: Insufficient documentation

## 2013-03-20 DIAGNOSIS — K208 Other esophagitis without bleeding: Secondary | ICD-10-CM | POA: Insufficient documentation

## 2013-03-20 DIAGNOSIS — M25511 Pain in right shoulder: Secondary | ICD-10-CM

## 2013-03-20 DIAGNOSIS — E119 Type 2 diabetes mellitus without complications: Secondary | ICD-10-CM | POA: Insufficient documentation

## 2013-03-20 DIAGNOSIS — E669 Obesity, unspecified: Secondary | ICD-10-CM | POA: Insufficient documentation

## 2013-03-20 DIAGNOSIS — M25519 Pain in unspecified shoulder: Secondary | ICD-10-CM | POA: Insufficient documentation

## 2013-03-20 DIAGNOSIS — Z7982 Long term (current) use of aspirin: Secondary | ICD-10-CM | POA: Insufficient documentation

## 2013-03-20 DIAGNOSIS — J45909 Unspecified asthma, uncomplicated: Secondary | ICD-10-CM | POA: Insufficient documentation

## 2013-03-20 DIAGNOSIS — Z862 Personal history of diseases of the blood and blood-forming organs and certain disorders involving the immune mechanism: Secondary | ICD-10-CM | POA: Insufficient documentation

## 2013-03-20 DIAGNOSIS — Z8679 Personal history of other diseases of the circulatory system: Secondary | ICD-10-CM | POA: Insufficient documentation

## 2013-03-20 DIAGNOSIS — Z8639 Personal history of other endocrine, nutritional and metabolic disease: Secondary | ICD-10-CM | POA: Insufficient documentation

## 2013-03-20 DIAGNOSIS — Z8659 Personal history of other mental and behavioral disorders: Secondary | ICD-10-CM | POA: Insufficient documentation

## 2013-03-20 DIAGNOSIS — K219 Gastro-esophageal reflux disease without esophagitis: Secondary | ICD-10-CM | POA: Insufficient documentation

## 2013-03-20 DIAGNOSIS — Z79899 Other long term (current) drug therapy: Secondary | ICD-10-CM | POA: Insufficient documentation

## 2013-03-20 DIAGNOSIS — I2510 Atherosclerotic heart disease of native coronary artery without angina pectoris: Secondary | ICD-10-CM | POA: Insufficient documentation

## 2013-03-20 DIAGNOSIS — Z8719 Personal history of other diseases of the digestive system: Secondary | ICD-10-CM | POA: Insufficient documentation

## 2013-03-20 DIAGNOSIS — I10 Essential (primary) hypertension: Secondary | ICD-10-CM | POA: Insufficient documentation

## 2013-03-20 DIAGNOSIS — Z794 Long term (current) use of insulin: Secondary | ICD-10-CM | POA: Insufficient documentation

## 2013-03-20 DIAGNOSIS — R5381 Other malaise: Secondary | ICD-10-CM | POA: Insufficient documentation

## 2013-03-20 DIAGNOSIS — R079 Chest pain, unspecified: Secondary | ICD-10-CM

## 2013-03-20 DIAGNOSIS — R071 Chest pain on breathing: Secondary | ICD-10-CM | POA: Insufficient documentation

## 2013-03-20 LAB — CBC WITH DIFFERENTIAL/PLATELET
Basophils Absolute: 0 10*3/uL (ref 0.0–0.1)
Basophils Relative: 1 % (ref 0–1)
Eosinophils Absolute: 0.1 10*3/uL (ref 0.0–0.7)
Eosinophils Relative: 3 % (ref 0–5)
MCH: 28.5 pg (ref 26.0–34.0)
MCV: 85.2 fL (ref 78.0–100.0)
Platelets: 126 10*3/uL — ABNORMAL LOW (ref 150–400)
RDW: 14.1 % (ref 11.5–15.5)
WBC: 3.9 10*3/uL — ABNORMAL LOW (ref 4.0–10.5)

## 2013-03-20 NOTE — ED Notes (Signed)
Pt arrived by EMS from [facility]. Pt complaining of chest pains that started a hour ago. Pt received 1 oxycodone 5 mg & 1 xanax & 1 325 ASA, w/o relief.

## 2013-03-20 NOTE — ED Notes (Signed)
Pt reports chest pain that started 1 hours ago. Pt was given pain med, xanax & ASA at [facility]. Pt describes pain as a pressure the goes up to her neck.

## 2013-03-21 ENCOUNTER — Emergency Department (HOSPITAL_COMMUNITY): Payer: Medicare Other

## 2013-03-21 LAB — BASIC METABOLIC PANEL
Calcium: 8.7 mg/dL (ref 8.4–10.5)
GFR calc non Af Amer: >90 mL/min (ref >90)
Sodium: 137 mEq/L (ref 135–145)

## 2013-03-21 LAB — TROPONIN I: Troponin I: <0.3 ng/mL (ref <0.30)

## 2013-03-21 NOTE — ED Notes (Signed)
Family states pt fell a week ago after her arm surgery & never was xrayed. Spoke w/ EDP. EDP ordered xray of right shoulder.

## 2013-03-21 NOTE — ED Provider Notes (Signed)
History     CSN: [id number]  Arrival date & time 03/20/13  2314   First MD Initiated Contact with Patient 03/20/13 2325      Chief Complaint  Patient presents with  . Chest Pain    (Consider location/radiation/quality/duration/timing/severity/associated sxs/prior treatment) HPI HPI Comments: [name] [name] is a 72 y.o. female brought in by ambulance, who presents to the Emergency Department complaining of posterior wall chest pain that radiates to the right shoulder. Pain to the right upper arm as well. She was given oxycodone, xanax, and asa without improvement. The right shoulder was operated on 02/17/2013 by Dr. Carola Frost. According to family member patient fell a week ago and was not xrayed. She has had increased pain since. Denies fever, chills,nausea, vomiting, shortness of breath.   PCP Dr. Phillips Odor Orthopedist Dr. Carola Frost  Past Medical History  Diagnosis Date  . CAD in native artery   . Thrombocytopenia, unspecified   . Unspecified hereditary and idiopathic peripheral neuropathy   . Unspecified fall   . Other and unspecified hyperlipidemia   . Unspecified essential hypertension   . Obesity, unspecified   . Shortness of breath   . Personal history of neurosis   . Intrinsic asthma, unspecified   . Allergic rhinitis, cause unspecified   . Cirrhosis of liver     NASH, afp on 06/17/12 =3.7, per pt she had hep B vaccines in 1996  . Colon adenomas 03/08/10    tcs by Dr. Jena Gauss  . Hyperplastic polyps of stomach 03/08/10    tcs by Dr. Geni Bers  . Chronic gastritis 03/09/11    egd by Dr. Jena Gauss  . History of hemorrhoids 03/08/10    tcs- internal and external  . Diverticula of colon 03/08/10    L side  . Hiatal hernia 03/08/10  . Esophagitis, erosive 03/08/10  . GERD (gastroesophageal reflux disease)   . Wears glasses   . Type II or unspecified type diabetes mellitus without mention of complication, not stated as uncontrolled   . Vertigo     Past Surgical History  Procedure Laterality Date   . Hysterectomy and btl      s/p  . Abdominal hysterectomy    . Tumor excision  2003    rt arm and left foot  . Colonoscopy  03/08/10    Dr. [name]-->ext/int hemorrhoids, anal paipilla, rectal polyps, desc polyps, cecal polyp, left-sided diverticula. suboptiomal prep. next TCS 02/2015. multiple adenomas  . Esophagogastroduodenoscopy  03/08/10    Dr. Dellie Catholic erosive RE, small hh, antral erosions, small 1cm are of mucosal indentation along gastric body of doubtful significance, cystic nodularity of hypophyarynx, base of tongue, base of epiglottis. benign gastric biopsies  . Tubal ligation    . Esophagogastroduodenoscopy  10/08/2011    Dr. Dionisio David esophageal varices, antral erosion. next egd 09/2013  . Foot surgery  2003    lt foot  . Breast surgery    . Eye surgery      cataracts  . Orif humerus fracture Right 02/17/2013    Procedure: OPEN REDUCTION INTERNAL FIXATION (ORIF) RIGHT PROXIMAL HUMERUS FRACTURE;  Surgeon: Budd Palmer, MD;  Location: [facility] OR;  Service: Orthopedics;  Laterality: Right;    Family History  Problem Relation Age of Onset  . Coronary artery disease      FH  . Diabetes      FH  . Heart disease Mother   . Colon cancer Neg Hx     History  Substance Use Topics  . Smoking status:  Never Smoker   . Smokeless tobacco: Not on file  . Alcohol Use: No    OB History   Grav Para Term Preterm Abortions TAB SAB Ect Mult Living                  Review of Systems  Constitutional: Negative for fever.       10 Systems reviewed and are negative for acute change except as noted in the HPI.  HENT: Negative for congestion.   Eyes: Negative for discharge and redness.  Respiratory: Negative for cough and shortness of breath.   Cardiovascular: Negative for chest pain.  Gastrointestinal: Negative for vomiting and abdominal pain.  Musculoskeletal: Negative for back pain.  Skin: Negative for rash.  Neurological: Positive for weakness. Negative for syncope, numbness and  headaches.       Lower extremity weakness is normal for the patient  Psychiatric/Behavioral:       No behavior change.    Allergies  Prochlorperazine edisylate and Compazine  Home Medications   Current Outpatient Rx  Name  Route  Sig  Dispense  Refill  . ALPRAZolam (XANAX) 0.5 MG tablet   Oral   Take 0.5 mg by mouth 3 (three) times daily as needed for anxiety.         Marland Kitchen aspirin (BAYER LOW STRENGTH) 81 MG EC tablet   Oral   Take 81 mg by mouth daily.           . budesonide-formoterol (SYMBICORT) 160-4.5 MCG/ACT inhaler   Inhalation   Inhale 2 puffs into the lungs 2 (two) times daily.           . cephALEXin (KEFLEX) 500 MG capsule   Oral   Take 500 mg by mouth 2 (two) times daily.         . clotrimazole (LOTRIMIN) 1 % cream   Topical   Apply topically 2 (two) times daily.   30 g   0   . docusate sodium 100 MG CAPS   Oral   Take 100 mg by mouth daily.   10 capsule   0   . enalapril (VASOTEC) 5 MG tablet   Oral   Take 5 mg by mouth daily.           . insulin aspart (NOVOLOG) 100 UNIT/ML injection   Subcutaneous   Inject 10-40 Units into the skin daily as needed. Per sliding scale. Patient injects 10-40 units for levels above 150 to 300          . insulin detemir (LEVEMIR) 100 UNIT/ML injection   Subcutaneous   Inject 60 Units into the skin at bedtime.          Marland Kitchen lactulose (CHRONULAC) 10 GM/15ML solution   Oral   Take 60 mLs by mouth daily.          Marland Kitchen linagliptin (TRADJENTA) 5 MG TABS tablet   Oral   Take 5 mg by mouth daily.         . meclizine (ANTIVERT) 25 MG tablet   Oral   Take 25 mg by mouth 3 (three) times daily as needed for dizziness or nausea.          . Multiple Vitamins-Minerals (CENTRUM SILVER PO)   Oral   Take 1 tablet by mouth daily.          Marland Kitchen omeprazole (PRILOSEC) 40 MG capsule   Oral   Take 40 mg by mouth daily.         Marland Kitchen  oxyCODONE (OXYCONTIN) 10 MG 12 hr tablet   Oral   Take 10 mg by mouth every 12  (twelve) hours.         . pregabalin (LYRICA) 100 MG capsule   Oral   Take 100 mg by mouth 2 (two) times daily.          Marland Kitchen esomeprazole (NEXIUM) 40 MG capsule   Oral   Take 40 mg by mouth daily before breakfast.           . HYDROcodone-acetaminophen (NORCO) 10-325 MG per tablet   Oral   Take 1 tablet by mouth every 4 (four) hours as needed for pain.   30 tablet   0     BP 112/87  Pulse 79  Temp(Src) 99.1 F (37.3 C) (Oral)  Resp 18  Ht 5\' 8"  (1.727 m)  Wt 240 lb (108.863 kg)  BMI 36.5 kg/m2  SpO2 97%  Physical Exam  Nursing note and vitals reviewed. Constitutional: She appears well-developed and well-nourished.  Awake, alert, nontoxic appearance.  HENT:  Head: Normocephalic and atraumatic.  Eyes: EOM are normal. Pupils are equal, round, and reactive to light.  Neck: Neck supple.  Cardiovascular: Normal rate and intact distal pulses.   Pulmonary/Chest: Effort normal. She exhibits no tenderness.  Abdominal: Soft. Bowel sounds are normal. There is no tenderness. There is no rebound.  Musculoskeletal: She exhibits no tenderness.  Baseline ROM, no obvious new focal weakness.Right upper arm with healing scar. Area of redness to distal end of the scar. Tenderness to entire shoulder with palpation.   Neurological:  Mental status and motor strength appears baseline for patient and situation.  Skin: No rash noted.  Resolving bruising to the right upper arm  Psychiatric: She has a normal mood and affect.    ED Course  Procedures (including critical care time) Results for orders placed during the hospital encounter of 03/20/13  CBC WITH DIFFERENTIAL      Result Value Range   WBC 3.9 (*) 4.0 - 10.5 K/uL   RBC 4.18  3.87 - 5.11 MIL/uL   Hemoglobin 11.9 (*) 12.0 - 15.0 g/dL   HCT 16.1 (*) 09.6 - 04.5 %   MCV 85.2  78.0 - 100.0 fL   MCH 28.5  26.0 - 34.0 pg   MCHC 33.4  30.0 - 36.0 g/dL   RDW 40.9  81.1 - 91.4 %   Platelets 126 (*) 150 - 400 K/uL   Neutrophils  Relative 61  43 - 77 %   Neutro Abs 2.4  1.7 - 7.7 K/uL   Lymphocytes Relative 25  12 - 46 %   Lymphs Abs 1.0  0.7 - 4.0 K/uL   Monocytes Relative 11  3 - 12 %   Monocytes Absolute 0.4  0.1 - 1.0 K/uL   Eosinophils Relative 3  0 - 5 %   Eosinophils Absolute 0.1  0.0 - 0.7 K/uL   Basophils Relative 1  0 - 1 %   Basophils Absolute 0.0  0.0 - 0.1 K/uL  BASIC METABOLIC PANEL      Result Value Range   Sodium 137  135 - 145 mEq/L   Potassium 3.5  3.5 - 5.1 mEq/L   Chloride 103  96 - 112 mEq/L   CO2 24  19 - 32 mEq/L   Glucose, Bld 241 (*) 70 - 99 mg/dL   BUN 9  6 - 23 mg/dL   Creatinine, Ser 7.82  0.50 - 1.10 mg/dL   Calcium  8.7  8.4 - 10.5 mg/dL   GFR calc non Af Amer >90  >90 mL/min   GFR calc Af Amer >90  >90 mL/min  TROPONIN I      Result Value Range   Troponin I <0.30  <0.30 ng/mL    Dg Shoulder Right  03/21/2013  *RADIOLOGY REPORT*  Clinical Data: Right shoulder pain.  Status post fracture fixation 02/17/2013.  RIGHT SHOULDER - 2+ VIEW  Comparison: Plain films of the right humerus 02/17/2013.  Findings: Plate and screws fixing a proximal humerus fracture are again seen.  There is lucency about the three most superior aspect of the plate and the superior aspect of the plate no longer contacts the humerus.  Medial angulation of the proximal fracture fragment is increased.  The humeral head appears located.  The acromioclavicular joint is intact.  IMPRESSION: Findings compatible with loosening or infection about the proximal three screws in fixation hardware for a humerus fracture.  As described above, the superior aspect of the plate has shifted and no longer contacts bone and there is increased medial angulation of the humeral head.  Dedicated plain films of the humerus for full visualization of the hardware recommended.   Original Report Authenticated By: Holley Dexter, M.D.    Dg Chest Portable 1 View  03/20/2013  *RADIOLOGY REPORT*  Clinical Data:  Retrosternal chest pain radiating  to right shoulder, ongoing cough and congestion, right humeral surgery 1 month ago  PORTABLE CHEST - 1 VIEW  Comparison: Portable exam 2340 hours compared to 02/14/2013  Findings: Enlargement of cardiac silhouette. Pulmonary vascular congestion. Atelectasis versus infiltrate right middle lobe increased since previous exam. Upper lungs clear. No pleural effusion or pneumothorax.  IMPRESSION: Atelectasis versus consolidation right middle lobe. Enlargement of cardiac silhouette with pulmonary vascular congestion.   Original Report Authenticated By: Ulyses Southward, M.D.    Dg Humerus Right  03/21/2013  *RADIOLOGY REPORT*  Clinical Data: Pain.  Recent fracture fixation.  RIGHT HUMERUS - 2+ VIEW  Comparison: Plain films 02/17/2013 and plain films of the right shoulder earlier this same date.  Findings: As seen on plain films earlier this same date, the patient has plate and screws fixing a comminuted proximal humerus fracture.  The proximal fracture fragment includes the humeral head and now shows increased medial angulation. There appears to be lucency about the proximal three screws.  The plate is no longer closely adherent to the superior margin of the humerus.  Remainder of the hardware appears is unremarkable.  IMPRESSION: Findings compatible with loosening or infection about the proximal three screws in a fixation plate for a humerus fracture.  As noted above, the humeral head is now angulated medially, new since the prior study.   Original Report Authenticated By: Holley Dexter, M.D.     Date: 03/20/2013     2322   Rate: 76  Rhythm: normal sinus rhythm  QRS Axis: normal  Intervals: normal  ST/T Wave abnormalities: normal  Conduction Disutrbances:left anterior fascicular block  Narrative Interpretation:   Old EKG Reviewed: unchanged c/w 08/08/12    0120 Reviewed xray resports with patient and her family. Will obtain humerus films. 0200 Reviewed the humerus films with the patient and family. Patient  has an appointment with Dr. Carola Frost on Wednesday. She will call him on Monday and review the xray report. MDM  Patient presents with chest pain that is posterior and to the right shoulder. She had been given medicines at 2200 at the SNF where she is a patient.  She had surgery on her right shoulder 02/17/2013 and is recovering. Films show loosening or infection around the three screws in the fixation place. Patient's labs are normal. Troponin is normal. Chest xray and EKG are unremarkable. She will contact orthopedist. She has an appointment on Wednesday. Will cancel PT on the right shoulder pending evaluation by orthopedist. Pt stable in ED with no significant deterioration in condition.The patient appears reasonably screened and/or stabilized for discharge and I doubt any other medical condition or other [facility] requiring further screening, evaluation, or treatment in the ED at this time prior to discharge.  MDM Reviewed: nursing note and vitals Interpretation: labs, x-ray and ECG           Aurther Loft S. Colon Branch, MD 03/21/13 [phone]

## 2013-04-13 ENCOUNTER — Ambulatory Visit (INDEPENDENT_AMBULATORY_CARE_PROVIDER_SITE_OTHER): Payer: Medicare Other | Admitting: Gastroenterology

## 2013-04-13 ENCOUNTER — Encounter: Payer: Self-pay | Admitting: Gastroenterology

## 2013-04-13 VITALS — BP 101/55 | HR 69 | Temp 97.6°F | Ht 65.0 in | Wt 238.4 lb

## 2013-04-13 DIAGNOSIS — I82402 Acute embolism and thrombosis of unspecified deep veins of left lower extremity: Secondary | ICD-10-CM

## 2013-04-13 DIAGNOSIS — I82409 Acute embolism and thrombosis of unspecified deep veins of unspecified lower extremity: Secondary | ICD-10-CM

## 2013-04-13 DIAGNOSIS — R600 Localized edema: Secondary | ICD-10-CM

## 2013-04-13 DIAGNOSIS — R609 Edema, unspecified: Secondary | ICD-10-CM

## 2013-04-13 DIAGNOSIS — K7689 Other specified diseases of liver: Secondary | ICD-10-CM

## 2013-04-13 DIAGNOSIS — K7460 Unspecified cirrhosis of liver: Secondary | ICD-10-CM

## 2013-04-13 MED ORDER — RIFAXIMIN 550 MG PO TABS: 550.0000 mg | ORAL_TABLET | Freq: Two times a day (BID) | ORAL | Status: DC

## 2013-04-13 MED ORDER — LACTULOSE 10 GM/15ML PO SOLN: 60.0000 mL | Freq: Three times a day (TID) | ORAL | Status: DC

## 2013-04-13 NOTE — Progress Notes (Signed)
Referring Provider: Corrie Mckusick, MD Primary Care Physician:  Colette Ribas, MD Primary GI: Dr. Jena Gauss   Chief Complaint  Patient presents with  . 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 Last seen October 2013. Been in [facility] since April. States legs gave way, broke her arm. Has plates/screws in arm. Left leg swollen. On Lasix. LLE swollen for a few weeks.No abdominal pain. +constipation once in awhile. Lactulose not working well. BM once per day. Lactulose 60 ml BID. No confusion, mental status changes per patient. Husband says she does have intermittent confusion. Sometimes clothes are on backwards. Hasn't had Hep A vaccine done yet. [place] of abdomen April 2014 without evidence of HCC. Due for surveillance EGD Nov 2014.   Past Medical History  Diagnosis Date  . CAD in native artery   . Thrombocytopenia, unspecified   . Unspecified hereditary and idiopathic peripheral neuropathy   . Unspecified fall   . Other and unspecified hyperlipidemia   . Unspecified essential hypertension   . Obesity, unspecified   . Shortness of breath   . Personal history of neurosis   . Intrinsic asthma, unspecified   . Allergic rhinitis, cause unspecified   . Cirrhosis of liver     NASH, afp on 06/17/12 =3.7, per pt she had hep B vaccines in 1996  . Colon adenomas 03/08/10    tcs by Dr. Jena Gauss  . Hyperplastic polyps of stomach 03/08/10    tcs by Dr. Geni Bers  . Chronic gastritis 03/09/11    egd by Dr. Jena Gauss  . History of hemorrhoids 03/08/10    tcs- internal and external  . Diverticula of colon 03/08/10    L side  . Hiatal hernia 03/08/10  . Esophagitis, erosive 03/08/10  . GERD (gastroesophageal reflux disease)   . Wears glasses   . Type II or unspecified type diabetes mellitus without mention of complication, not stated as uncontrolled   . Vertigo     Past Surgical History  Procedure Laterality Date  . Hysterectomy and btl       s/p  . Abdominal hysterectomy    . Tumor excision  2003    rt arm and left foot  . Colonoscopy  03/08/10    Dr. [name]-->ext/int hemorrhoids, anal paipilla, rectal polyps, desc polyps, cecal polyp, left-sided diverticula. suboptiomal prep. next TCS 02/2015. multiple adenomas  . Esophagogastroduodenoscopy  03/08/10    Dr. Dellie Catholic erosive RE, small hh, antral erosions, small 1cm are of mucosal indentation along gastric body of doubtful significance, cystic nodularity of hypophyarynx, base of tongue, base of epiglottis. benign gastric biopsies  . Tubal ligation    . Esophagogastroduodenoscopy  10/08/2011    Dr. Dionisio David esophageal varices, antral erosion. next egd 09/2013  . Foot surgery  2003    lt foot  . Breast surgery    . Eye surgery      cataracts  . Orif humerus fracture Right 02/17/2013    Procedure: OPEN REDUCTION INTERNAL FIXATION (ORIF) RIGHT PROXIMAL HUMERUS FRACTURE;  Surgeon: Budd Palmer, MD;  Location: [facility] OR;  Service: Orthopedics;  Laterality: Right;    Current Outpatient Prescriptions  Medication Sig Dispense Refill  . aspirin (BAYER LOW STRENGTH) 81 MG EC tablet Take 81 mg by mouth daily.        . budesonide-formoterol (SYMBICORT) 160-4.5 MCG/ACT inhaler Inhale 2 puffs into the lungs 2 (two) times daily.        . cetirizine (  ZYRTEC) 10 MG chewable tablet Chew 10 mg by mouth daily.      Marland Kitchen docusate sodium 100 MG CAPS Take 100 mg by mouth daily.  10 capsule  0  . enalapril (VASOTEC) 5 MG tablet Take 5 mg by mouth daily.        Marland Kitchen esomeprazole (NEXIUM) 40 MG capsule Take 40 mg by mouth daily before breakfast.        . furosemide (LASIX) 20 MG tablet Take 20 mg by mouth 2 (two) times daily.      . insulin aspart (NOVOLOG) 100 UNIT/ML injection Inject 10-40 Units into the skin daily as needed. Per sliding scale. Patient injects 10-40 units for levels above 150 to 300       . insulin detemir (LEVEMIR) 100 UNIT/ML injection Inject 60 Units into the skin at bedtime.       Marland Kitchen  lactulose (CHRONULAC) 10 GM/15ML solution Take 60 mLs (40 g total) by mouth 3 (three) times daily.  5400 mL  3  . linagliptin (TRADJENTA) 5 MG TABS tablet Take 5 mg by mouth daily.      . meclizine (ANTIVERT) 25 MG tablet Take 25 mg by mouth 3 (three) times daily as needed for dizziness or nausea.       . Multiple Vitamins-Minerals (CENTRUM SILVER PO) Take 1 tablet by mouth daily.       Marland Kitchen omeprazole (PRILOSEC) 40 MG capsule Take 40 mg by mouth daily.      Marland Kitchen oxyCODONE (OXYCONTIN) 10 MG 12 hr tablet Take 10 mg by mouth every 12 (twelve) hours.      . pregabalin (LYRICA) 100 MG capsule Take 100 mg by mouth 2 (two) times daily.       Marland Kitchen ALPRAZolam (XANAX) 0.5 MG tablet Take 0.5 mg by mouth 3 (three) times daily as needed for anxiety.      . cephALEXin (KEFLEX) 500 MG capsule Take 500 mg by mouth 2 (two) times daily.      Marland Kitchen HYDROcodone-acetaminophen (NORCO) 10-325 MG per tablet Take 1 tablet by mouth every 4 (four) hours as needed for pain.  30 tablet  0  . rifaximin (XIFAXAN) 550 MG TABS Take 1 tablet (550 mg total) by mouth 2 (two) times daily.  60 tablet  3   No current facility-administered medications for this visit.    Allergies as of 04/13/2013 - Review Complete 03/20/2013  Allergen Reaction Noted  . Prochlorperazine edisylate Other (See Comments)   . Compazine (prochlorperazine edisylate) Anxiety 06/04/2012    Family History  Problem Relation Age of Onset  . Coronary artery disease      FH  . Diabetes      FH  . Heart disease Mother   . Colon cancer Neg Hx     History   Social History  . Marital Status: Married    Spouse Name: N/A    Number of Children: 3  . Years of Education: N/A   Social History Main Topics  . Smoking status: Never Smoker   . Smokeless tobacco: None  . Alcohol Use: No  . Drug Use: No  . Sexually Active: Not Currently   Other Topics Concern  . None   Social History Narrative  . None    Review of Systems: Negative unless mentioned in HPI.    Physical Exam: BP 101/55  Pulse 69  Temp(Src) 97.6 F (36.4 C) (Oral)  Ht 5\' 5"  (1.651 m)  Wt 238 lb 6.4 oz (108.138 kg)  BMI  39.67 kg/m2 General:   Alert and oriented. No distress noted. Pleasant and cooperative. In wheelchair.  Head:  Normocephalic and atraumatic. Eyes:  Conjuctiva clear without scleral icterus. Mouth:  Oral mucosa pink and moist. Good dentition. No lesions. Heart:  S1, S2 present without murmurs, rubs, or gallops. Regular rate and rhythm. Abdomen:  +BS, soft, non-tender and non-distended. Limited, patient in wheelchair.  Msk:  Symmetrical without gross deformities. Normal posture. Extremities:  LLE 2+ edema, obviously more edematous than RLE Neurologic:  Alert and  oriented x4;  grossly normal neurologically. Skin:  Intact without significant lesions or rashes. Psych:  Alert and cooperative. Normal mood and affect.

## 2013-04-13 NOTE — Patient Instructions (Addendum)
I have increased Lactulose to three times a day instead of just twice a day. I have added Xifaxan, which you take twice a day. Your insurance may not cover this, so we may need to do a prior authorization. Our office will work on this if needed.  Please have blood work done and ultrasound of your leg. We will be calling you with the results.  Hepatitis A vaccination is still needed; I have provided an updated prescription for you.  We will see you in October to schedule a repeat upper endoscopy.

## 2013-04-14 ENCOUNTER — Ambulatory Visit (HOSPITAL_COMMUNITY)
Admission: RE | Admit: 2013-04-14 | Discharge: 2013-04-14 | Disposition: A | Discharge status: Home / Self Care | Payer: Medicare Other | Source: Ambulatory Visit | Attending: Gastroenterology | Admitting: Gastroenterology

## 2013-04-14 DIAGNOSIS — K7581 Nonalcoholic steatohepatitis (NASH): Secondary | ICD-10-CM | POA: Insufficient documentation

## 2013-04-14 DIAGNOSIS — I82402 Acute embolism and thrombosis of unspecified deep veins of left lower extremity: Secondary | ICD-10-CM

## 2013-04-14 DIAGNOSIS — R600 Localized edema: Secondary | ICD-10-CM | POA: Insufficient documentation

## 2013-04-14 DIAGNOSIS — I82409 Acute embolism and thrombosis of unspecified deep veins of unspecified lower extremity: Secondary | ICD-10-CM | POA: Insufficient documentation

## 2013-04-14 DIAGNOSIS — M7989 Other specified soft tissue disorders: Secondary | ICD-10-CM | POA: Insufficient documentation

## 2013-04-14 DIAGNOSIS — K7460 Unspecified cirrhosis of liver: Secondary | ICD-10-CM | POA: Insufficient documentation

## 2013-04-14 DIAGNOSIS — E119 Type 2 diabetes mellitus without complications: Secondary | ICD-10-CM | POA: Insufficient documentation

## 2013-04-14 NOTE — Assessment & Plan Note (Signed)
[place] of abdomen up-to-date, negative HCC. Originally denies confusion, but husband states she has bouts of confusion moreso than normal. Likely narcotics playing somewhat of a role; she is using this sparingly. Increase Lactulose to 60 cc TID. Add Xifaxan BID if insurance allows. Check CMP, CBC now to update. Patient also curious about ammonia level; discussed this is a dynamic result, which she understands. Return in Oct to set up EGD for surveillance. TCS not due till 2016. Provided Hep A vaccine rx again as well. She has had Hep B.

## 2013-04-14 NOTE — Progress Notes (Signed)
Quick Note:  No evidence of blood clot! This is great news.  Please cc her PCP at [facility]. ______

## 2013-04-14 NOTE — Assessment & Plan Note (Signed)
New onset several weeks ago. Noticeably more edematous than RLE. Duplex to r/o DVT. Doubt dealing with DVT, but I would like to evaluate this further.

## 2013-04-15 NOTE — Progress Notes (Signed)
Cc PCP 

## 2013-04-16 LAB — CBC WITH DIFFERENTIAL/PLATELET
Basophils Absolute: 0 10*3/uL (ref 0.0–0.1)
Basophils Relative: 1 % (ref 0–1)
Eosinophils Relative: 5 % (ref 0–5)
Lymphocytes Relative: 26 % (ref 12–46)
MCV: 77.2 fL — ABNORMAL LOW (ref 78.0–100.0)
Neutro Abs: 2.6 10*3/uL (ref 1.7–7.7)
Platelets: 142 10*3/uL — ABNORMAL LOW (ref 150–400)
RDW: 14.5 % (ref 11.5–15.5)
WBC: 4.1 10*3/uL (ref 4.0–10.5)

## 2013-04-16 LAB — COMPREHENSIVE METABOLIC PANEL
ALT: 11 U/L (ref 0–35)
AST: 20 U/L (ref 0–37)
Alkaline Phosphatase: 115 U/L (ref 39–117)
Calcium: 9.2 mg/dL (ref 8.4–10.5)
Chloride: 106 mEq/L (ref 96–112)
Creat: 0.59 mg/dL (ref 0.50–1.10)

## 2013-04-21 ENCOUNTER — Telehealth: Payer: Self-pay | Admitting: Internal Medicine

## 2013-04-21 NOTE — Progress Notes (Signed)
Quick Note:  Labs reviewed:  Hgb is better, Platelets slightly lower than normal value at 142 but still staying in her "normal range".  LFTs overall about the same. MELD score (UNOS) is 9.  Ammonia level 90, which is a little more elevated than normal (upper range of normal is 60). I am not surprised at this. Needs to continue lactulose dosing as I outlined at her visit. I hope Jeneen Rinks has been added without difficulty from insurance company. ______

## 2013-04-21 NOTE — Telephone Encounter (Signed)
Routing to AS for results.  

## 2013-04-21 NOTE — Telephone Encounter (Signed)
See result note.  

## 2013-04-21 NOTE — Telephone Encounter (Signed)
Pt is aware of results. 

## 2013-04-21 NOTE — Telephone Encounter (Signed)
Pt was calling from [facility] to see what her lab results were. I told her that we would call her back at [facility] when they are available. She said she would call back around 2 pm. [phone]

## 2013-04-22 NOTE — Progress Notes (Signed)
Quick Note:  Do we need to do a PA? It is unfortunate. ______

## 2013-04-26 ENCOUNTER — Encounter (HOSPITAL_COMMUNITY): Payer: Self-pay | Admitting: Emergency Medicine

## 2013-04-26 ENCOUNTER — Emergency Department (HOSPITAL_COMMUNITY): Payer: Medicare Other

## 2013-04-26 ENCOUNTER — Inpatient Hospital Stay (HOSPITAL_COMMUNITY)
Admission: EM | Admit: 2013-04-26 | Discharge: 2013-04-28 | DRG: 439 | Disposition: A | Discharge status: Skilled Nursing Facility | Payer: Medicare Other | Attending: Internal Medicine | Admitting: Internal Medicine

## 2013-04-26 ENCOUNTER — Inpatient Hospital Stay (HOSPITAL_COMMUNITY): Payer: Medicare Other

## 2013-04-26 DIAGNOSIS — R079 Chest pain, unspecified: Secondary | ICD-10-CM

## 2013-04-26 DIAGNOSIS — K859 Acute pancreatitis without necrosis or infection, unspecified: Principal | ICD-10-CM

## 2013-04-26 DIAGNOSIS — E785 Hyperlipidemia, unspecified: Secondary | ICD-10-CM

## 2013-04-26 DIAGNOSIS — Z8249 Family history of ischemic heart disease and other diseases of the circulatory system: Secondary | ICD-10-CM

## 2013-04-26 DIAGNOSIS — D649 Anemia, unspecified: Secondary | ICD-10-CM

## 2013-04-26 DIAGNOSIS — G609 Hereditary and idiopathic neuropathy, unspecified: Secondary | ICD-10-CM

## 2013-04-26 DIAGNOSIS — J45909 Unspecified asthma, uncomplicated: Secondary | ICD-10-CM

## 2013-04-26 DIAGNOSIS — N23 Unspecified renal colic: Secondary | ICD-10-CM

## 2013-04-26 DIAGNOSIS — K7460 Unspecified cirrhosis of liver: Secondary | ICD-10-CM

## 2013-04-26 DIAGNOSIS — Z833 Family history of diabetes mellitus: Secondary | ICD-10-CM

## 2013-04-26 DIAGNOSIS — R42 Dizziness and giddiness: Secondary | ICD-10-CM

## 2013-04-26 DIAGNOSIS — G9349 Other encephalopathy: Secondary | ICD-10-CM

## 2013-04-26 DIAGNOSIS — R296 Repeated falls: Secondary | ICD-10-CM

## 2013-04-26 DIAGNOSIS — I2510 Atherosclerotic heart disease of native coronary artery without angina pectoris: Secondary | ICD-10-CM

## 2013-04-26 DIAGNOSIS — E876 Hypokalemia: Secondary | ICD-10-CM

## 2013-04-26 DIAGNOSIS — N6099 Unspecified benign mammary dysplasia of unspecified breast: Secondary | ICD-10-CM

## 2013-04-26 DIAGNOSIS — R262 Difficulty in walking, not elsewhere classified: Secondary | ICD-10-CM

## 2013-04-26 DIAGNOSIS — R0602 Shortness of breath: Secondary | ICD-10-CM

## 2013-04-26 DIAGNOSIS — K8050 Calculus of bile duct without cholangitis or cholecystitis without obstruction: Secondary | ICD-10-CM | POA: Diagnosis present

## 2013-04-26 DIAGNOSIS — IMO0002 Reserved for concepts with insufficient information to code with codable children: Secondary | ICD-10-CM

## 2013-04-26 DIAGNOSIS — D62 Acute posthemorrhagic anemia: Secondary | ICD-10-CM

## 2013-04-26 DIAGNOSIS — R600 Localized edema: Secondary | ICD-10-CM

## 2013-04-26 DIAGNOSIS — K219 Gastro-esophageal reflux disease without esophagitis: Secondary | ICD-10-CM

## 2013-04-26 DIAGNOSIS — I10 Essential (primary) hypertension: Secondary | ICD-10-CM

## 2013-04-26 DIAGNOSIS — Z860100 Personal history of colon polyps, unspecified: Secondary | ICD-10-CM

## 2013-04-26 DIAGNOSIS — E119 Type 2 diabetes mellitus without complications: Secondary | ICD-10-CM

## 2013-04-26 DIAGNOSIS — K7581 Nonalcoholic steatohepatitis (NASH): Secondary | ICD-10-CM | POA: Diagnosis present

## 2013-04-26 DIAGNOSIS — N390 Urinary tract infection, site not specified: Secondary | ICD-10-CM

## 2013-04-26 DIAGNOSIS — G629 Polyneuropathy, unspecified: Secondary | ICD-10-CM

## 2013-04-26 DIAGNOSIS — E871 Hypo-osmolality and hyponatremia: Secondary | ICD-10-CM

## 2013-04-26 DIAGNOSIS — M76829 Posterior tibial tendinitis, unspecified leg: Secondary | ICD-10-CM

## 2013-04-26 DIAGNOSIS — Z794 Long term (current) use of insulin: Secondary | ICD-10-CM

## 2013-04-26 DIAGNOSIS — R270 Ataxia, unspecified: Secondary | ICD-10-CM

## 2013-04-26 DIAGNOSIS — Z8659 Personal history of other mental and behavioral disorders: Secondary | ICD-10-CM

## 2013-04-26 DIAGNOSIS — D696 Thrombocytopenia, unspecified: Secondary | ICD-10-CM

## 2013-04-26 DIAGNOSIS — G9340 Encephalopathy, unspecified: Secondary | ICD-10-CM

## 2013-04-26 DIAGNOSIS — R29898 Other symptoms and signs involving the musculoskeletal system: Secondary | ICD-10-CM

## 2013-04-26 DIAGNOSIS — J309 Allergic rhinitis, unspecified: Secondary | ICD-10-CM

## 2013-04-26 DIAGNOSIS — Z90710 Acquired absence of both cervix and uterus: Secondary | ICD-10-CM

## 2013-04-26 DIAGNOSIS — Z01818 Encounter for other preprocedural examination: Secondary | ICD-10-CM

## 2013-04-26 DIAGNOSIS — E1165 Type 2 diabetes mellitus with hyperglycemia: Secondary | ICD-10-CM

## 2013-04-26 DIAGNOSIS — E669 Obesity, unspecified: Secondary | ICD-10-CM

## 2013-04-26 DIAGNOSIS — Z9181 History of falling: Secondary | ICD-10-CM

## 2013-04-26 DIAGNOSIS — K7689 Other specified diseases of liver: Secondary | ICD-10-CM

## 2013-04-26 DIAGNOSIS — Z6841 Body Mass Index (BMI) 40.0 and over, adult: Secondary | ICD-10-CM

## 2013-04-26 DIAGNOSIS — R531 Weakness: Secondary | ICD-10-CM

## 2013-04-26 DIAGNOSIS — Z903 Acquired absence of stomach [part of]: Secondary | ICD-10-CM

## 2013-04-26 LAB — CBC WITH DIFFERENTIAL/PLATELET
Basophils Absolute: 0 10*3/uL (ref 0.0–0.1)
Basophils Relative: 1 % (ref 0–1)
Eosinophils Absolute: 0.1 10*3/uL (ref 0.0–0.7)
HCT: 41.5 % (ref 36.0–46.0)
Hemoglobin: 13.9 g/dL (ref 12.0–15.0)
MCH: 27.7 pg (ref 26.0–34.0)
MCHC: 33.5 g/dL (ref 30.0–36.0)
Monocytes Absolute: 0.2 10*3/uL (ref 0.1–1.0)
Monocytes Relative: 6 % (ref 3–12)
Neutro Abs: 2.6 10*3/uL (ref 1.7–7.7)
Neutrophils Relative %: 66 % (ref 43–77)
RDW: 14.4 % (ref 11.5–15.5)

## 2013-04-26 LAB — BASIC METABOLIC PANEL
BUN: 11 mg/dL (ref 6–23)
Calcium: 9.4 mg/dL (ref 8.4–10.5)
Creatinine, Ser: 0.64 mg/dL (ref 0.50–1.10)
GFR calc Af Amer: >90 mL/min (ref >90)

## 2013-04-26 LAB — LIPASE, BLOOD: Lipase: 507 U/L — ABNORMAL HIGH (ref 11–59)

## 2013-04-26 LAB — GLUCOSE, CAPILLARY: Glucose-Capillary: 142 mg/dL — ABNORMAL HIGH (ref 70–99)

## 2013-04-26 LAB — AMMONIA: Ammonia: 59 umol/L (ref 11–60)

## 2013-04-26 LAB — HEPATIC FUNCTION PANEL
AST: 25 U/L (ref 0–37)
Bilirubin, Direct: 0.4 mg/dL — ABNORMAL HIGH (ref 0.0–0.3)
Total Bilirubin: 1.5 mg/dL — ABNORMAL HIGH (ref 0.3–1.2)

## 2013-04-26 LAB — TROPONIN I: Troponin I: <0.3 ng/mL (ref <0.30)

## 2013-04-26 MED ORDER — IOHEXOL 300 MG/ML  SOLN
100.0000 mL | Freq: Once | INTRAMUSCULAR | Status: AC | PRN
  Administered 2013-04-26: 100 mL via INTRAVENOUS

## 2013-04-26 MED ORDER — MECLIZINE HCL 12.5 MG PO TABS: 25.0000 mg | ORAL_TABLET | Freq: Three times a day (TID) | ORAL | Status: DC | PRN

## 2013-04-26 MED ORDER — HEPARIN SODIUM (PORCINE) 5000 UNIT/ML IJ SOLN
5000.0000 [IU] | Freq: Three times a day (TID) | INTRAMUSCULAR | Status: DC
  Administered 2013-04-26 – 2013-04-28 (×5): 5000 [IU] via SUBCUTANEOUS
  Filled 2013-04-26 (×5): qty 1

## 2013-04-26 MED ORDER — INSULIN ASPART 100 UNIT/ML ~~LOC~~ SOLN
0.0000 [IU] | Freq: Three times a day (TID) | SUBCUTANEOUS | Status: DC
  Administered 2013-04-27: 3 [IU] via SUBCUTANEOUS

## 2013-04-26 MED ORDER — LACTULOSE 10 GM/15ML PO SOLN: 40.0000 g | Freq: Three times a day (TID) | ORAL | Status: DC

## 2013-04-26 MED ORDER — FUROSEMIDE 20 MG PO TABS: 20.0000 mg | ORAL_TABLET | Freq: Two times a day (BID) | ORAL | Status: DC

## 2013-04-26 MED ORDER — INSULIN DETEMIR 100 UNIT/ML ~~LOC~~ SOLN
60.0000 [IU] | Freq: Every day | SUBCUTANEOUS | Status: DC
  Administered 2013-04-26 – 2013-04-27 (×2): 60 [IU] via SUBCUTANEOUS
  Filled 2013-04-26 (×3): qty 0.6

## 2013-04-26 MED ORDER — PREGABALIN 50 MG PO CAPS
100.0000 mg | ORAL_CAPSULE | Freq: Two times a day (BID) | ORAL | Status: DC
  Administered 2013-04-26 – 2013-04-28 (×4): 100 mg via ORAL
  Filled 2013-04-26 (×4): qty 2

## 2013-04-26 MED ORDER — INSULIN ASPART 100 UNIT/ML ~~LOC~~ SOLN
0.0000 [IU] | Freq: Every day | SUBCUTANEOUS | Status: DC
Start: 2013-04-26 — End: 2013-04-28

## 2013-04-26 MED ORDER — OXYCODONE HCL ER 10 MG PO T12A
10.0000 mg | EXTENDED_RELEASE_TABLET | Freq: Two times a day (BID) | ORAL | Status: DC
  Administered 2013-04-26 – 2013-04-28 (×4): 10 mg via ORAL
  Filled 2013-04-26 (×4): qty 1

## 2013-04-26 MED ORDER — LINAGLIPTIN 5 MG PO TABS
5.0000 mg | ORAL_TABLET | Freq: Every day | ORAL | Status: DC
  Administered 2013-04-27 – 2013-04-28 (×2): 5 mg via ORAL
  Filled 2013-04-26 (×5): qty 1

## 2013-04-26 MED ORDER — SODIUM CHLORIDE 0.9 % IV SOLN: INTRAVENOUS | Status: AC

## 2013-04-26 MED ORDER — ALPRAZOLAM 0.5 MG PO TABS
0.5000 mg | ORAL_TABLET | Freq: Three times a day (TID) | ORAL | Status: DC | PRN
  Administered 2013-04-26 – 2013-04-27 (×2): 0.5 mg via ORAL
  Filled 2013-04-26 (×2): qty 1

## 2013-04-26 MED ORDER — ENALAPRIL MALEATE 5 MG PO TABS
5.0000 mg | ORAL_TABLET | Freq: Every day | ORAL | Status: DC
  Administered 2013-04-28: 5 mg via ORAL
  Filled 2013-04-26 (×4): qty 1

## 2013-04-26 MED ORDER — ONDANSETRON HCL 4 MG PO TABS
4.0000 mg | ORAL_TABLET | Freq: Four times a day (QID) | ORAL | Status: DC | PRN
  Administered 2013-04-27: 4 mg via ORAL
  Filled 2013-04-26: qty 1

## 2013-04-26 MED ORDER — ASPIRIN 81 MG PO CHEW
324.0000 mg | CHEWABLE_TABLET | Freq: Once | ORAL | Status: AC
  Administered 2013-04-26: 324 mg via ORAL
  Filled 2013-04-26: qty 4

## 2013-04-26 MED ORDER — SODIUM CHLORIDE 0.9 % IJ SOLN: 3.0000 mL | Freq: Two times a day (BID) | INTRAMUSCULAR | Status: DC

## 2013-04-26 MED ORDER — RIFAXIMIN 550 MG PO TABS
ORAL_TABLET | ORAL | Status: AC
  Filled 2013-04-26: qty 1

## 2013-04-26 MED ORDER — BUDESONIDE-FORMOTEROL FUMARATE 160-4.5 MCG/ACT IN AERO
2.0000 | INHALATION_SPRAY | Freq: Two times a day (BID) | RESPIRATORY_TRACT | Status: DC
  Administered 2013-04-26 – 2013-04-28 (×4): 2 via RESPIRATORY_TRACT
  Filled 2013-04-26: qty 6

## 2013-04-26 MED ORDER — LACTULOSE 10 GM/15ML PO SOLN
40.0000 g | Freq: Three times a day (TID) | ORAL | Status: DC
  Administered 2013-04-26 – 2013-04-27 (×3): 40 g via ORAL
  Filled 2013-04-26: qty 60
  Filled 2013-04-26: qty 30
  Filled 2013-04-26: qty 60

## 2013-04-26 MED ORDER — SODIUM CHLORIDE 0.9 % IV SOLN
INTRAVENOUS | Status: DC
  Administered 2013-04-26 – 2013-04-27 (×3): via INTRAVENOUS

## 2013-04-26 MED ORDER — FUROSEMIDE 20 MG PO TABS
20.0000 mg | ORAL_TABLET | Freq: Two times a day (BID) | ORAL | Status: DC
  Administered 2013-04-26 – 2013-04-28 (×4): 20 mg via ORAL
  Filled 2013-04-26 (×4): qty 1

## 2013-04-26 MED ORDER — HYDROMORPHONE HCL PF 1 MG/ML IJ SOLN
1.0000 mg | INTRAMUSCULAR | Status: DC | PRN
  Administered 2013-04-26 – 2013-04-28 (×2): 1 mg via INTRAVENOUS
  Filled 2013-04-26 (×3): qty 1

## 2013-04-26 MED ORDER — IOHEXOL 300 MG/ML  SOLN
50.0000 mL | Freq: Once | INTRAMUSCULAR | Status: AC | PRN
  Administered 2013-04-26: 50 mL via ORAL

## 2013-04-26 MED ORDER — FENTANYL CITRATE 0.05 MG/ML IJ SOLN: 50.0000 ug | INTRAMUSCULAR | Status: DC | PRN

## 2013-04-26 MED ORDER — ASPIRIN EC 81 MG PO TBEC
81.0000 mg | DELAYED_RELEASE_TABLET | Freq: Every day | ORAL | Status: DC
  Administered 2013-04-27 – 2013-04-28 (×2): 81 mg via ORAL
  Filled 2013-04-26 (×4): qty 1

## 2013-04-26 MED ORDER — BUDESONIDE-FORMOTEROL FUMARATE 160-4.5 MCG/ACT IN AERO
INHALATION_SPRAY | RESPIRATORY_TRACT | Status: AC
  Filled 2013-04-26: qty 6

## 2013-04-26 MED ORDER — NITROGLYCERIN 0.4 MG SL SUBL
0.4000 mg | SUBLINGUAL_TABLET | SUBLINGUAL | Status: DC | PRN
  Administered 2013-04-26: 0.4 mg via SUBLINGUAL
  Filled 2013-04-26: qty 25

## 2013-04-26 MED ORDER — PANTOPRAZOLE SODIUM 40 MG PO TBEC: 40.0000 mg | DELAYED_RELEASE_TABLET | Freq: Every day | ORAL | Status: DC

## 2013-04-26 MED ORDER — INSULIN DETEMIR 100 UNIT/ML ~~LOC~~ SOLN
SUBCUTANEOUS | Status: AC
  Filled 2013-04-26: qty 1

## 2013-04-26 MED ORDER — HEPARIN SODIUM (PORCINE) 5000 UNIT/ML IJ SOLN: 5000.0000 [IU] | Freq: Three times a day (TID) | INTRAMUSCULAR | Status: DC

## 2013-04-26 MED ORDER — LORATADINE 10 MG PO TABS
10.0000 mg | ORAL_TABLET | Freq: Every day | ORAL | Status: DC
  Administered 2013-04-27 – 2013-04-28 (×2): 10 mg via ORAL
  Filled 2013-04-26 (×2): qty 1

## 2013-04-26 MED ORDER — RIFAXIMIN 550 MG PO TABS
550.0000 mg | ORAL_TABLET | Freq: Two times a day (BID) | ORAL | Status: DC
  Administered 2013-04-26 – 2013-04-28 (×4): 550 mg via ORAL
  Filled 2013-04-26 (×7): qty 1

## 2013-04-26 MED ORDER — PANTOPRAZOLE SODIUM 40 MG PO TBEC
80.0000 mg | DELAYED_RELEASE_TABLET | Freq: Every day | ORAL | Status: DC
  Administered 2013-04-26 – 2013-04-27 (×2): 80 mg via ORAL
  Filled 2013-04-26 (×2): qty 2

## 2013-04-26 MED ORDER — MORPHINE SULFATE 4 MG/ML IJ SOLN
4.0000 mg | INTRAMUSCULAR | Status: DC | PRN
  Filled 2013-04-26: qty 1

## 2013-04-26 MED ORDER — ONDANSETRON HCL 4 MG/2ML IJ SOLN: 4.0000 mg | Freq: Four times a day (QID) | INTRAMUSCULAR | Status: DC | PRN

## 2013-04-26 NOTE — H&P (Signed)
[facility] History and Physical  [name] [name] RUE:[id number] DOB: September 23, 1941 DOA: 04/26/2013  Referring physician: Dr. Clarene Duke, ER. PCP: Colette Ribas, MD  Specialists: Dr. Kendell Bane, gastroenterology.  Chief Complaint: Epigastric abdominal pain.  HPI: [name] [name] is a 72 y.o. female who presents to the hospital with an episode of epigastric pain which started approximately 5 hours ago, radiating to the back. She did not experience any vomiting but felt that she was anorexic. Apparently the pain is gone now after she had nitroglycerin and aspirin. She does have a history of coronary artery disease but has never been told that she has had an MI. 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 She is morbidly obese. She has hypertension. She has diabetes type 2. She is a nonsmoker. She does not apparently have a history of gallstones.   Review of Systems: .  Apart from history of present illness, other systems negative.  Past Medical History  Diagnosis Date  . CAD in native artery   . Thrombocytopenia, unspecified   . Unspecified hereditary and idiopathic peripheral neuropathy   . Unspecified fall   . Other and unspecified hyperlipidemia   . Unspecified essential hypertension   . Obesity, unspecified   . Shortness of breath   . Personal history of neurosis   . Intrinsic asthma, unspecified   . Allergic rhinitis, cause unspecified   . Cirrhosis of liver     NASH, afp on 06/17/12 =3.7, per pt she had hep B vaccines in 1996  . Colon adenomas 03/08/10    tcs by Dr. Jena Gauss  . Hyperplastic polyps of stomach 03/08/10    tcs by Dr. Geni Bers  . Chronic gastritis 03/09/11    egd by Dr. Jena Gauss  . History of hemorrhoids 03/08/10    tcs- internal and external  . Diverticula of colon 03/08/10    L side  . Hiatal hernia 03/08/10  . Esophagitis, erosive 03/08/10  . GERD (gastroesophageal reflux disease)   . Wears glasses   . Type II or unspecified type diabetes mellitus  without mention of complication, not stated as uncontrolled   . Vertigo    Past Surgical History  Procedure Laterality Date  . Hysterectomy and btl      s/p  . Abdominal hysterectomy    . Tumor excision  2003    rt arm and left foot  . Colonoscopy  03/08/10    Dr. [name]-->ext/int hemorrhoids, anal paipilla, rectal polyps, desc polyps, cecal polyp, left-sided diverticula. suboptiomal prep. next TCS 02/2015. multiple adenomas  . Esophagogastroduodenoscopy  03/08/10    Dr. Dellie Catholic erosive RE, small hh, antral erosions, small 1cm are of mucosal indentation along gastric body of doubtful significance, cystic nodularity of hypophyarynx, base of tongue, base of epiglottis. benign gastric biopsies  . Tubal ligation    . Esophagogastroduodenoscopy  10/08/2011    Dr. Dionisio David esophageal varices, antral erosion. next egd 09/2013  . Foot surgery  2003    lt foot  . Breast surgery    . Eye surgery      cataracts  . Orif humerus fracture Right 02/17/2013    Procedure: OPEN REDUCTION INTERNAL FIXATION (ORIF) RIGHT PROXIMAL HUMERUS FRACTURE;  Surgeon: Budd Palmer, MD;  Location: [facility] OR;  Service: Orthopedics;  Laterality: Right;  . Partial gastrectomy     Social History:  reports that she has never smoked. She does not have any smokeless tobacco history on file. She reports that she does not drink alcohol or use illicit drugs.  She is married and lives with her husband. She is currently in a skilled nursing facility secondary to rehabilitation for surgery to the right humerus. She does not smoke cigarettes and she does not drink alcohol. She is usually independent.  Allergies  Allergen Reactions  . Morphine And Related Itching  . Prochlorperazine Edisylate Other (See Comments)    Causes anxiety/numbness  . Compazine (Prochlorperazine Edisylate) Anxiety    Causes anxiety & nervousness    Family History  Problem Relation Age of Onset  . Coronary artery disease      FH  . Diabetes      FH   . Heart disease Mother   . Colon cancer Neg Hx       Prior to Admission medications   Medication Sig Start Date End Date Taking? Authorizing Provider  ALPRAZolam Prudy Feeler) 0.5 MG tablet Take 0.5 mg by mouth 3 (three) times daily as needed for anxiety. 08/09/12  Yes Erick Blinks, MD  aspirin (BAYER LOW STRENGTH) 81 MG EC tablet Take 81 mg by mouth daily.     Yes Historical Provider, MD  budesonide-formoterol (SYMBICORT) 160-4.5 MCG/ACT inhaler Inhale 2 puffs into the lungs 2 (two) times daily.     Yes Historical Provider, MD  enalapril (VASOTEC) 5 MG tablet Take 5 mg by mouth daily.     Yes Historical Provider, MD  esomeprazole (NEXIUM) 40 MG capsule Take 40 mg by mouth daily before breakfast.     Yes Historical Provider, MD  furosemide (LASIX) 20 MG tablet Take 20 mg by mouth 2 (two) times daily.   Yes Historical Provider, MD  cetirizine (ZYRTEC) 10 MG chewable tablet Chew 10 mg by mouth daily.    Historical Provider, MD  insulin aspart (NOVOLOG) 100 UNIT/ML injection Inject 10-40 Units into the skin daily as needed. Per sliding scale. Patient injects 10-40 units for levels above 150 to 300     Historical Provider, MD  insulin detemir (LEVEMIR) 100 UNIT/ML injection Inject 60 Units into the skin at bedtime.     Historical Provider, MD  lactulose (CHRONULAC) 10 GM/15ML solution Take 60 mLs (40 g total) by mouth 3 (three) times daily. 04/13/13   Nira Retort, NP  linagliptin (TRADJENTA) 5 MG TABS tablet Take 5 mg by mouth daily.    Historical Provider, MD  meclizine (ANTIVERT) 25 MG tablet Take 25 mg by mouth 3 (three) times daily as needed for dizziness or nausea.     Historical Provider, MD  Multiple Vitamins-Minerals (CENTRUM SILVER PO) Take 1 tablet by mouth daily.     Historical Provider, MD  omeprazole (PRILOSEC) 40 MG capsule Take 40 mg by mouth daily.    Historical Provider, MD  oxyCODONE (OXYCONTIN) 10 MG 12 hr tablet Take 10 mg by mouth every 12 (twelve) hours.    Historical Provider, MD   pregabalin (LYRICA) 100 MG capsule Take 100 mg by mouth 2 (two) times daily.     Historical Provider, MD  rifaximin (XIFAXAN) 550 MG TABS Take 1 tablet (550 mg total) by mouth 2 (two) times daily. 04/13/13   Nira Retort, NP   Physical Exam: Filed Vitals:   04/26/13 1426 04/26/13 1553 04/26/13 1600  BP: 143/59 124/54 112/59  Pulse: 72 69 69  Temp: 98.1 F (36.7 C)    TempSrc: Oral    Resp: 20  16  Weight: 104.327 kg (230 lb)    SpO2: 93%  98%     General:  She looks systemically well. She does  not look like she is in pain at the present time. She is not toxic or septic.  Eyes: No jaundice. No pallor.  ENT: No abnormalities.  Neck: Thick neck. No lymphadenopathy.  Cardiovascular: Heart sounds are present without any murmurs or added sounds.  Respiratory: Lung fields are clear.  Abdomen: Obese abdomen. No tenderness.  Skin: No rash.  Musculoskeletal: No acute joint abnormalities.  Psychiatric: Appropriate affect.  Neurologic: Alert and orientated without any focal neurological signs. There is no evidence of hepatic encephalopathy.  Labs on Admission:  Basic Metabolic Panel:  Recent Labs Lab 04/26/13 1442  NA 139  K 4.0  CL 103  CO2 27  GLUCOSE 160*  BUN 11  CREATININE 0.64  CALCIUM 9.4   Liver Function Tests:  Recent Labs Lab 04/26/13 1538  AST 25  ALT 13  ALKPHOS 142*  BILITOT 1.5*  PROT 6.3  ALBUMIN 3.3*    Recent Labs Lab 04/26/13 1538  LIPASE 507*    Recent Labs Lab 04/26/13 1539  AMMONIA 59   CBC:  Recent Labs Lab 04/26/13 1442  WBC 3.8*  NEUTROABS 2.6  HGB 13.9  HCT 41.5  MCV 82.8  PLT 122*   Cardiac Enzymes:  Recent Labs Lab 04/26/13 1442  TROPONINI <0.30      Radiological Exams on Admission: Dg Chest Portable 1 View  04/26/2013   *RADIOLOGY REPORT*  Clinical Data: Chest pain  PORTABLE CHEST - 1 VIEW  Comparison:  03/20/2013  Findings: Cardiomegaly is noted.  No acute infiltrate or pleural effusion.  No  pulmonary edema.  IMPRESSION: Cardiomegaly.  No active disease.   Original Report Authenticated By: Natasha Mead, M.D.    EKG: Independently reviewed. Normal sinus rhythm without any evidence of significant ST-T wave changes. There are nonspecific changes but I do not think these are relevant.  Assessment/Plan   1. Acute pancreatitis, etiology unclear. 2. Morbid obesity. 3. Type 2 diabetes mellitus. 4. Liver cirrhosis secondary to [place]. 5. Hypertension.  Plan: 1. Admit to telemetry floor. 2. Clear fluid diet. 3. IV fluids. 4. Analgesia as required. 5. Sliding scale of insulin for diabetic control. 6. Serial cardiac enzymes and case this reflects cardiac pain although I doubt this. 7. CT scan of the abdomen. 8. Gastroenterology consultation.  Further recommendations will depend on patient's hospital progress.  Code Status: Full code.   Family Communication: Discussed plan with patient at the bedside.   Disposition Plan: Home when medically stable.   Time spent: 45 minutes.  Wilson Singer [facility] Pager [phone].  If 7PM-7AM, please contact night-coverage [email or web address] Password [email or web address] 04/26/2013, 5:09 PM

## 2013-04-26 NOTE — ED Provider Notes (Signed)
History     CSN: [id number]  Arrival date & time 04/26/13  1422   First MD Initiated Contact with Patient 04/26/13 1502      Chief Complaint  Patient presents with  . Chest Pain    HPI Pt was seen at 1515.  Per pt and her family, c/o gradual onset and worsening of persistent lower mid-sternal chest "pain" since approx 1230 PTA. Pt states she was sitting in a chair when the pain began. States she began to have discomfort in the middle of her back, then into her lower mid-chest and upper abd areas.  Was associated with anorexia.  States she has hx of similar pain, but cannot recall the dx.  Denies palpitations, no SOB/cough, no vomiting/diarrhea, no fevers.    Past Medical History  Diagnosis Date  . CAD in native artery   . Thrombocytopenia, unspecified   . Unspecified hereditary and idiopathic peripheral neuropathy   . Unspecified fall   . Other and unspecified hyperlipidemia   . Unspecified essential hypertension   . Obesity, unspecified   . Shortness of breath   . Personal history of neurosis   . Intrinsic asthma, unspecified   . Allergic rhinitis, cause unspecified   . Cirrhosis of liver     NASH, afp on 06/17/12 =3.7, per pt she had hep B vaccines in 1996  . Colon adenomas 03/08/10    tcs by Dr. Jena Gauss  . Hyperplastic polyps of stomach 03/08/10    tcs by Dr. Geni Bers  . Chronic gastritis 03/09/11    egd by Dr. Jena Gauss  . History of hemorrhoids 03/08/10    tcs- internal and external  . Diverticula of colon 03/08/10    L side  . Hiatal hernia 03/08/10  . Esophagitis, erosive 03/08/10  . GERD (gastroesophageal reflux disease)   . Wears glasses   . Type II or unspecified type diabetes mellitus without mention of complication, not stated as uncontrolled   . Vertigo     Past Surgical History  Procedure Laterality Date  . Hysterectomy and btl      s/p  . Abdominal hysterectomy    . Tumor excision  2003    rt arm and left foot  . Colonoscopy  03/08/10    Dr. [name]-->ext/int  hemorrhoids, anal paipilla, rectal polyps, desc polyps, cecal polyp, left-sided diverticula. suboptiomal prep. next TCS 02/2015. multiple adenomas  . Esophagogastroduodenoscopy  03/08/10    Dr. Dellie Catholic erosive RE, small hh, antral erosions, small 1cm are of mucosal indentation along gastric body of doubtful significance, cystic nodularity of hypophyarynx, base of tongue, base of epiglottis. benign gastric biopsies  . Tubal ligation    . Esophagogastroduodenoscopy  10/08/2011    Dr. Dionisio David esophageal varices, antral erosion. next egd 09/2013  . Foot surgery  2003    lt foot  . Breast surgery    . Eye surgery      cataracts  . Orif humerus fracture Right 02/17/2013    Procedure: OPEN REDUCTION INTERNAL FIXATION (ORIF) RIGHT PROXIMAL HUMERUS FRACTURE;  Surgeon: Budd Palmer, MD;  Location: [facility] OR;  Service: Orthopedics;  Laterality: Right;  . Partial gastrectomy    . Cardiac catheterization  02/25/2007    Dr. Clarene Duke:  normal LV systolic function, mild irregularities of LAD    Family History  Problem Relation Age of Onset  . Coronary artery disease      FH  . Diabetes      FH  . Heart disease Mother   .  Colon cancer Neg Hx     History  Substance Use Topics  . Smoking status: Never Smoker   . Smokeless tobacco: Not on file  . Alcohol Use: No      Review of Systems ROS: Statement: All systems negative except as marked or noted in the HPI; Constitutional: Negative for fever and chills. ; ; Eyes: Negative for eye pain, redness and discharge. ; ; ENMT: Negative for ear pain, hoarseness, nasal congestion, sinus pressure and sore throat. ; ; Cardiovascular: +chest pain. Negative for palpitations, diaphoresis, dyspnea and peripheral edema. ; ; Respiratory: Negative for cough, wheezing and stridor. ; ; Gastrointestinal: +abd pain. Negative for nausea, vomiting, diarrhea, blood in stool, hematemesis, jaundice and rectal bleeding. . ; ; Genitourinary: Negative for dysuria, flank pain and  hematuria. ; ; Musculoskeletal: +back pain. Negative for neck pain. Negative for swelling and trauma.; ; Skin: Negative for pruritus, rash, abrasions, blisters, bruising and skin lesion.; ; Neuro: Negative for headache, lightheadedness and neck stiffness. Negative for weakness, altered level of consciousness , altered mental status, extremity weakness, paresthesias, involuntary movement, seizure and syncope.       Allergies  Morphine and related; Prochlorperazine edisylate; and Compazine  Home Medications   Current Outpatient Rx  Name  Route  Sig  Dispense  Refill  . ALPRAZolam (XANAX) 0.5 MG tablet   Oral   Take 0.5 mg by mouth 3 (three) times daily as needed for anxiety.         Marland Kitchen aspirin (BAYER LOW STRENGTH) 81 MG EC tablet   Oral   Take 81 mg by mouth daily.           . budesonide-formoterol (SYMBICORT) 160-4.5 MCG/ACT inhaler   Inhalation   Inhale 2 puffs into the lungs 2 (two) times daily.           . enalapril (VASOTEC) 5 MG tablet   Oral   Take 5 mg by mouth daily.           Marland Kitchen esomeprazole (NEXIUM) 40 MG capsule   Oral   Take 40 mg by mouth daily before breakfast.           . furosemide (LASIX) 20 MG tablet   Oral   Take 20 mg by mouth 2 (two) times daily.         . cetirizine (ZYRTEC) 10 MG chewable tablet   Oral   Chew 10 mg by mouth daily.         . insulin aspart (NOVOLOG) 100 UNIT/ML injection   Subcutaneous   Inject 10-40 Units into the skin daily as needed. Per sliding scale. Patient injects 10-40 units for levels above 150 to 300          . insulin detemir (LEVEMIR) 100 UNIT/ML injection   Subcutaneous   Inject 60 Units into the skin at bedtime.          Marland Kitchen lactulose (CHRONULAC) 10 GM/15ML solution   Oral   Take 60 mLs (40 g total) by mouth 3 (three) times daily.   5400 mL   3     1 month supply   . linagliptin (TRADJENTA) 5 MG TABS tablet   Oral   Take 5 mg by mouth daily.         . meclizine (ANTIVERT) 25 MG tablet    Oral   Take 25 mg by mouth 3 (three) times daily as needed for dizziness or nausea.          Marland Kitchen  Multiple Vitamins-Minerals (CENTRUM SILVER PO)   Oral   Take 1 tablet by mouth daily.          Marland Kitchen omeprazole (PRILOSEC) 40 MG capsule   Oral   Take 40 mg by mouth daily.         Marland Kitchen oxyCODONE (OXYCONTIN) 10 MG 12 hr tablet   Oral   Take 10 mg by mouth every 12 (twelve) hours.         . pregabalin (LYRICA) 100 MG capsule   Oral   Take 100 mg by mouth 2 (two) times daily.          . rifaximin (XIFAXAN) 550 MG TABS   Oral   Take 1 tablet (550 mg total) by mouth 2 (two) times daily.   60 tablet   3     BP 112/59  Pulse 69  Temp(Src) 98.1 F (36.7 C) (Oral)  Resp 16  Wt 230 lb (104.327 kg)  BMI 38.27 kg/m2  SpO2 98%  Physical Exam 1520: Physical examination:  Nursing notes reviewed; Vital signs and O2 SAT reviewed;  Constitutional: Well developed, Well nourished, Well hydrated, In no acute distress; Head:  Normocephalic, atraumatic; Eyes: EOMI, PERRL, No scleral icterus; ENMT: Mouth and pharynx normal, Mucous membranes moist; Neck: Supple, Full range of motion, No lymphadenopathy; Cardiovascular: Regular rate and rhythm, No murmur, rub, or gallop; Respiratory: Breath sounds clear & equal bilaterally, No rales, rhonchi, wheezes.  Speaking full sentences with ease, Normal respiratory effort/excursion; Chest: Nontender, Movement normal; Abdomen: Soft, +mild mid-epigastric tenderness to palp. No rebound or guarding. Nondistended, Normal bowel sounds; Genitourinary: No CVA tenderness; Spine:  No midline CS, TS, LS tenderness.;; Extremities: Pulses normal, No tenderness, No edema, No calf edema or asymmetry.; Neuro: AA&Ox3, Major CN grossly intact.  Speech clear. No gross focal motor or sensory deficits in extremities.; Skin: Color normal, Warm, Dry.   ED Course  Procedures     MDM  MDM Reviewed: previous chart, nursing note and vitals Reviewed previous: labs and  ECG Interpretation: labs, ECG and x-ray      Date: 04/26/2013  Rate: 68  Rhythm: normal sinus rhythm  QRS Axis: left  Intervals: normal  ST/T Wave abnormalities: normal  Conduction Disutrbances:left anterior fascicular block  Narrative Interpretation:   Old EKG Reviewed: unchanged; no significant changes from previous EKG dated 03/20/2013.  Results for orders placed during the hospital encounter of 04/26/13  BASIC METABOLIC PANEL      Result Value Range   Sodium 139  135 - 145 mEq/L   Potassium 4.0  3.5 - 5.1 mEq/L   Chloride 103  96 - 112 mEq/L   CO2 27  19 - 32 mEq/L   Glucose, Bld 160 (*) 70 - 99 mg/dL   BUN 11  6 - 23 mg/dL   Creatinine, Ser 5.62  0.50 - 1.10 mg/dL   Calcium 9.4  8.4 - 13.0 mg/dL   GFR calc non Af Amer 89 (*) >90 mL/min   GFR calc Af Amer >90  >90 mL/min  TROPONIN I      Result Value Range   Troponin I <0.30  <0.30 ng/mL  CBC WITH DIFFERENTIAL      Result Value Range   WBC 3.8 (*) 4.0 - 10.5 K/uL   RBC 5.01  3.87 - 5.11 MIL/uL   Hemoglobin 13.9  12.0 - 15.0 g/dL   HCT 86.5  78.4 - 69.6 %   MCV 82.8  78.0 - 100.0 fL   MCH 27.7  26.0 - 34.0 pg   MCHC 33.5  30.0 - 36.0 g/dL   RDW 16.1  09.6 - 04.5 %   Platelets 122 (*) 150 - 400 K/uL   Neutrophils Relative % 66  43 - 77 %   Neutro Abs 2.6  1.7 - 7.7 K/uL   Lymphocytes Relative 25  12 - 46 %   Lymphs Abs 1.0  0.7 - 4.0 K/uL   Monocytes Relative 6  3 - 12 %   Monocytes Absolute 0.2  0.1 - 1.0 K/uL   Eosinophils Relative 2  0 - 5 %   Eosinophils Absolute 0.1  0.0 - 0.7 K/uL   Basophils Relative 1  0 - 1 %   Basophils Absolute 0.0  0.0 - 0.1 K/uL  HEPATIC FUNCTION PANEL      Result Value Range   Total Protein 6.3  6.0 - 8.3 g/dL   Albumin 3.3 (*) 3.5 - 5.2 g/dL   AST 25  0 - 37 U/L   ALT 13  0 - 35 U/L   Alkaline Phosphatase 142 (*) 39 - 117 U/L   Total Bilirubin 1.5 (*) 0.3 - 1.2 mg/dL   Bilirubin, Direct 0.4 (*) 0.0 - 0.3 mg/dL   Indirect Bilirubin 1.1 (*) 0.3 - 0.9 mg/dL  LIPASE, BLOOD       Result Value Range   Lipase 507 (*) 11 - 59 U/L  AMMONIA      Result Value Range   Ammonia 59  11 - 60 umol/L   Dg Chest Portable 1 View 04/26/2013   *RADIOLOGY REPORT*  Clinical Data: Chest pain  PORTABLE CHEST - 1 VIEW  Comparison:  03/20/2013  Findings: Cardiomegaly is noted.  No acute infiltrate or pleural effusion.  No pulmonary edema.  IMPRESSION: Cardiomegaly.  No active disease.   Original Report Authenticated By: Natasha Mead, M.D.    Results for KAYLIA, WINBORNE (MRN [id number]) as of 04/26/2013 17:00  Ref. Range 02/15/2013 06:44 02/16/2013 06:27 04/16/2013 08:25 04/26/2013 15:38  Total Bilirubin Latest Range: 0.3-1.2 mg/dL 2.4 (H) 2.1 (H) 1.6 (H) 1.5 (H)    1655:  Pt's CP improved after ASA and ntg. EKG without acute STTW changes and troponin normal.  Lipase elevated. Tbili elevated, but has hx of same. Doubt aortic pathology, as pt has hx of normal aorta on previous CT and US exams in [facility]. Dx and testing d/w pt and family.  Questions answered.  Verb understanding, agreeable to observation admit. T/C to [facility] Dr. Karilyn Cota, case discussed, including:  HPI, pertinent PM/SHx, VS/PE, dx testing, ED course and treatment:  Agreeable to admit, requests to write temporary orders, obtain tele bed to team 2.         Laray Anger, DO 04/27/13 1404

## 2013-04-26 NOTE — ED Notes (Signed)
States she started having chest pain about 30 min prior to arrival, pain radiates into her back.

## 2013-04-26 NOTE — ED Notes (Signed)
Report given to Cindy, RN unit 300. Ready to receive patient. 

## 2013-04-27 ENCOUNTER — Telehealth: Payer: Self-pay | Admitting: Gastroenterology

## 2013-04-27 DIAGNOSIS — K7460 Unspecified cirrhosis of liver: Secondary | ICD-10-CM

## 2013-04-27 DIAGNOSIS — K859 Acute pancreatitis without necrosis or infection, unspecified: Secondary | ICD-10-CM

## 2013-04-27 LAB — GLUCOSE, CAPILLARY
Glucose-Capillary: 110 mg/dL — ABNORMAL HIGH (ref 70–99)
Glucose-Capillary: 136 mg/dL — ABNORMAL HIGH (ref 70–99)
Glucose-Capillary: 169 mg/dL — ABNORMAL HIGH (ref 70–99)

## 2013-04-27 LAB — CBC
HCT: 34.2 % — ABNORMAL LOW (ref 36.0–46.0)
MCHC: 33.6 g/dL (ref 30.0–36.0)
MCV: 82.6 fL (ref 78.0–100.0)
RDW: 14.4 % (ref 11.5–15.5)

## 2013-04-27 LAB — MRSA PCR SCREENING: MRSA by PCR: NEGATIVE

## 2013-04-27 LAB — COMPREHENSIVE METABOLIC PANEL
ALT: 11 U/L (ref 0–35)
Albumin: 2.8 g/dL — ABNORMAL LOW (ref 3.5–5.2)
Alkaline Phosphatase: 121 U/L — ABNORMAL HIGH (ref 39–117)
Chloride: 102 mEq/L (ref 96–112)
GFR calc Af Amer: >90 mL/min (ref >90)
Glucose, Bld: 122 mg/dL — ABNORMAL HIGH (ref 70–99)
Potassium: 3.6 mEq/L (ref 3.5–5.1)
Sodium: 136 mEq/L (ref 135–145)
Total Bilirubin: 1.3 mg/dL — ABNORMAL HIGH (ref 0.3–1.2)
Total Protein: 5.3 g/dL — ABNORMAL LOW (ref 6.0–8.3)

## 2013-04-27 LAB — LIPASE, BLOOD: Lipase: 67 U/L — ABNORMAL HIGH (ref 11–59)

## 2013-04-27 MED ORDER — INSULIN DETEMIR 100 UNIT/ML ~~LOC~~ SOLN
SUBCUTANEOUS | Status: AC
  Filled 2013-04-27: qty 1

## 2013-04-27 MED ORDER — PANTOPRAZOLE SODIUM 40 MG PO TBEC
40.0000 mg | DELAYED_RELEASE_TABLET | Freq: Two times a day (BID) | ORAL | Status: DC
  Administered 2013-04-27 – 2013-04-28 (×3): 40 mg via ORAL
  Filled 2013-04-27 (×3): qty 1

## 2013-04-27 NOTE — Consult Note (Signed)
Referring Provider: Dr. Karilyn Cota Primary Care Physician:  Colette Ribas, MD Primary Gastroenterologist:  Dr. Jena Gauss   Date of Admission: 04/26/13 Date of Consultation: 04/27/13  Reason for Consultation:  Pancreatitis  HPI:  Ms. [name] [name] is a pleasant 72 year old female well-known to our practice with a history of NASH cirrhosis, just seen as an outpatient for routine follow-up in early June. She was admitted with epigastric abdominal pain and found to have a lipase of 507. Known history of gallstones, specifically noted on [place] of abdomen in October 2013. Most recent US of abdomen was in April 2014.   Notes acute onset of epigastric pain, radiating to her back prior to presentation to ED yesterday. No N/V. Symptoms started right before lunch. Had eaten 2 pieces of bacon, toast, and orange juice for breakfast. Only had 1 bite of cream corn, mac n cheese. No ETOH, no prior issues with pancreatitis. Started Lasix a few weeks ago.   4 BMs at home, Lactulose increased at last visit in June 2014. Wants to finish rehab therapy at [facility]. States she was going to be discharged from [facility] on the 25th possibly.   Due for surveillance EGD Nov 2014.   Past Medical History  Diagnosis Date  . CAD in native artery   . Thrombocytopenia, unspecified   . Unspecified hereditary and idiopathic peripheral neuropathy   . Unspecified fall   . Other and unspecified hyperlipidemia   . Unspecified essential hypertension   . Obesity, unspecified   . Shortness of breath   . Personal history of neurosis   . Intrinsic asthma, unspecified   . Allergic rhinitis, cause unspecified   . Cirrhosis of liver     NASH, afp on 06/17/12 =3.7, per pt she had hep B vaccines in 1996  . Colon adenomas 03/08/10    tcs by Dr. Jena Gauss  . Hyperplastic polyps of stomach 03/08/10    tcs by Dr. Geni Bers  . Chronic gastritis 03/09/11    egd by Dr. Jena Gauss  . History of hemorrhoids 03/08/10    tcs- internal and external  . Diverticula of colon  03/08/10    L side  . Hiatal hernia 03/08/10  . Esophagitis, erosive 03/08/10  . GERD (gastroesophageal reflux disease)   . Wears glasses   . Type II or unspecified type diabetes mellitus without mention of complication, not stated as uncontrolled   . Vertigo     Past Surgical History  Procedure Laterality Date  . Hysterectomy and btl      s/p  . Abdominal hysterectomy    . Tumor excision  2003    rt arm and left foot  . Colonoscopy  03/08/10    Dr. Thatiana Renbarger-->ext/int hemorrhoids, anal paipilla, rectal polyps, desc polyps, cecal polyp, left-sided diverticula. suboptiomal prep. next TCS 02/2015. multiple adenomas  . Esophagogastroduodenoscopy  03/08/10    Dr. Dellie Catholic erosive RE, small hh, antral erosions, small 1cm are of mucosal indentation along gastric body of doubtful significance, cystic nodularity of hypophyarynx, base of tongue, base of epiglottis. benign gastric biopsies  . Tubal ligation    . Esophagogastroduodenoscopy  10/08/2011    Dr. Dionisio David esophageal varices, antral erosion. next egd 09/2013  . Foot surgery  2003    lt foot  . Breast surgery    . Eye surgery      cataracts  . Orif humerus fracture Right 02/17/2013    Procedure: OPEN REDUCTION INTERNAL FIXATION (ORIF) RIGHT PROXIMAL HUMERUS FRACTURE;  Surgeon: Budd Palmer, MD;  Location[facility]  OR;  Service: Orthopedics;  Laterality: Right;  . Partial gastrectomy    . Cardiac catheterization  02/25/2007    Dr. Clarene Duke:  normal LV systolic function, mild irregularities of LAD    Prior to Admission medications   Medication Sig Start Date End Date Taking? Authorizing Provider  ALPRAZolam Prudy Feeler) 0.5 MG tablet Take 0.5 mg by mouth 3 (three) times daily as needed for anxiety. 08/09/12  Yes Erick Blinks, MD  aspirin (BAYER LOW STRENGTH) 81 MG EC tablet Take 81 mg by mouth daily.     Yes Historical Provider, MD  budesonide-formoterol (SYMBICORT) 160-4.5 MCG/ACT inhaler Inhale 2 puffs into the lungs 2 (two) times daily.     Yes  Historical Provider, MD  enalapril (VASOTEC) 5 MG tablet Take 5 mg by mouth daily.     Yes Historical Provider, MD  esomeprazole (NEXIUM) 40 MG capsule Take 40 mg by mouth daily before breakfast.     Yes Historical Provider, MD  furosemide (LASIX) 20 MG tablet Take 20 mg by mouth 2 (two) times daily.   Yes Historical Provider, MD  cetirizine (ZYRTEC) 10 MG chewable tablet Chew 10 mg by mouth daily.    Historical Provider, MD  insulin aspart (NOVOLOG) 100 UNIT/ML injection Inject 10-40 Units into the skin daily as needed. Per sliding scale. Patient injects 10-40 units for levels above 150 to 300     Historical Provider, MD  insulin detemir (LEVEMIR) 100 UNIT/ML injection Inject 60 Units into the skin at bedtime.     Historical Provider, MD  lactulose (CHRONULAC) 10 GM/15ML solution Take 60 mLs (40 g total) by mouth 3 (three) times daily. 04/13/13   Nira Retort, NP  linagliptin (TRADJENTA) 5 MG TABS tablet Take 5 mg by mouth daily.    Historical Provider, MD  meclizine (ANTIVERT) 25 MG tablet Take 25 mg by mouth 3 (three) times daily as needed for dizziness or nausea.     Historical Provider, MD  Multiple Vitamins-Minerals (CENTRUM SILVER PO) Take 1 tablet by mouth daily.     Historical Provider, MD  omeprazole (PRILOSEC) 40 MG capsule Take 40 mg by mouth daily.    Historical Provider, MD  oxyCODONE (OXYCONTIN) 10 MG 12 hr tablet Take 10 mg by mouth every 12 (twelve) hours.    Historical Provider, MD  pregabalin (LYRICA) 100 MG capsule Take 100 mg by mouth 2 (two) times daily.     Historical Provider, MD  rifaximin (XIFAXAN) 550 MG TABS Take 1 tablet (550 mg total) by mouth 2 (two) times daily. 04/13/13   Nira Retort, NP    Current Facility-Administered Medications  Medication Dose Route Frequency Provider Last Rate Last Dose  . 0.9 %  sodium chloride infusion   Intravenous Continuous Wilson Singer, MD 125 mL/hr at 04/26/13 2103    . ALPRAZolam Prudy Feeler) tablet 0.5 mg  0.5 mg Oral TID PRN Wilson Singer, MD   0.5 mg at 04/26/13 2211  . aspirin EC tablet 81 mg  81 mg Oral Daily Nimish C Gosrani, MD      . budesonide-formoterol (SYMBICORT) 160-4.5 MCG/ACT inhaler 2 puff  2 puff Inhalation BID Wilson Singer, MD   2 puff at 04/27/13 0720  . enalapril (VASOTEC) tablet 5 mg  5 mg Oral Daily Nimish C Gosrani, MD      . furosemide (LASIX) tablet 20 mg  20 mg Oral BID Nimish C Karilyn Cota, MD   20 mg at 04/26/13 1935  . heparin injection 5,000  Units  5,000 Units Subcutaneous Q8H Wilson Singer, MD   5,000 Units at 04/27/13 0617  . HYDROmorphone (DILAUDID) injection 1 mg  1 mg Intravenous Q4H PRN Wilson Singer, MD   1 mg at 04/26/13 1836  . insulin aspart (novoLOG) injection 0-20 Units  0-20 Units Subcutaneous TID WC Nimish C Gosrani, MD      . insulin aspart (novoLOG) injection 0-5 Units  0-5 Units Subcutaneous QHS Nimish C Gosrani, MD      . insulin detemir (LEVEMIR) injection 60 Units  60 Units Subcutaneous QHS Wilson Singer, MD   60 Units at 04/26/13 2211  . lactulose (CHRONULAC) 10 GM/15ML solution 40 g  40 g Oral TID Wilson Singer, MD   40 g at 04/26/13 2100  . linagliptin (TRADJENTA) tablet 5 mg  5 mg Oral Daily Nimish C Gosrani, MD      . loratadine (CLARITIN) tablet 10 mg  10 mg Oral Daily Nimish Normajean Glasgow, MD      . meclizine (ANTIVERT) tablet 25 mg  25 mg Oral TID PRN Nimish Normajean Glasgow, MD      . ondansetron (ZOFRAN) tablet 4 mg  4 mg Oral Q6H PRN Nimish Normajean Glasgow, MD       Or  . ondansetron (ZOFRAN) injection 4 mg  4 mg Intravenous Q6H PRN Nimish C Gosrani, MD      . oxyCODONE (OXYCONTIN) 12 hr tablet 10 mg  10 mg Oral Q12H Nimish C Gosrani, MD   10 mg at 04/26/13 2101  . pantoprazole (PROTONIX) EC tablet 80 mg  80 mg Oral Daily Wilson Singer, MD   80 mg at 04/26/13 1935  . pregabalin (LYRICA) capsule 100 mg  100 mg Oral BID Wilson Singer, MD   100 mg at 04/26/13 2100  . rifaximin (XIFAXAN) tablet 550 mg  550 mg Oral BID Wilson Singer, MD   550 mg at 04/26/13 2211  .  sodium chloride 0.9 % injection 3 mL  3 mL Intravenous Q12H Wilson Singer, MD        Allergies as of 04/26/2013 - Review Complete 04/26/2013  Allergen Reaction Noted  . Morphine and related Itching 04/26/2013  . Prochlorperazine edisylate Other (See Comments)   . Compazine (prochlorperazine edisylate) Anxiety 06/04/2012    Family History  Problem Relation Age of Onset  . Coronary artery disease      FH  . Diabetes      FH  . Heart disease Mother   . Colon cancer Neg Hx     History   Social History  . Marital Status: Married    Spouse Name: N/A    Number of Children: 3  . Years of Education: N/A   Occupational History  . Not on file.   Social History Main Topics  . Smoking status: Never Smoker   . Smokeless tobacco: Not on file  . Alcohol Use: No  . Drug Use: No  . Sexually Active: Not Currently   Other Topics Concern  . Not on file   Social History Narrative  . No narrative on file    Review of Systems: Negative unless mentioned in HPI.   Physical Exam: Vital signs in last 24 hours: Temp:  [98.1 F (36.7 C)-98.5 F (36.9 C)] 98.5 F (36.9 C) (06/16 0434) Pulse Rate:  [60-72] 70 (06/16 0434) Resp:  [14-20] 16 (06/16 0434) BP: (111-143)/(53-81) 111/73 mmHg (06/16 0434) SpO2:  [93 %-100 %] 95 % (06/16 0721)  Weight:  [230 lb (104.327 kg)-241 lb 1.6 oz (109.362 kg)] 241 lb 1.6 oz (109.362 kg) (06/16 0434)   General:   Alert,  Well-developed, well-nourished, pleasant and cooperative in NAD Head:  Normocephalic and atraumatic. Eyes:  Sclera clear, no icterus.   Conjunctiva pink. Ears:  Normal auditory acuity. Nose:  No deformity, discharge,  or lesions. Mouth:  Poor dentition.  Neck:  Supple; no masses or thyromegaly. Lungs:  Clear throughout to auscultation.   No wheezes, crackles, or rhonchi. No acute distress. Heart:  Regular rate and rhythm; no murmurs, clicks, rubs,  or gallops. Abdomen:  Soft, nontender and nondistended. No masses,  hepatosplenomegaly or hernias noted. Normal bowel sounds, without guarding, and without rebound.   Rectal:  Deferred  Msk:  Symmetrical without gross deformities. Normal posture. Extremities:  LLE with 2+ pedal edema, NEGATIVE DUPLEX ULTRASOUND ON FILE  Neurologic:  Alert and  oriented x4;  grossly normal neurologically. Skin:  Intact without significant lesions or rashes. Cervical Nodes:  No significant cervical adenopathy. Psych:  Alert and cooperative. Normal mood and affect.  Intake/Output from previous day: 06/15 0701 - 06/16 0700 In: 1508.8 [P.O.:390; I.V.:1118.8] Out: 1000 [Urine:1000] Intake/Output this shift:    Lab Results:  Recent Labs  04/26/13 1442 04/27/13 0318  WBC 3.8* 3.1*  HGB 13.9 11.5*  HCT 41.5 34.2*  PLT 122* PLATELET COUNT CONFIRMED BY SMEAR   BMET  Recent Labs  04/26/13 1442 04/27/13 0318  NA 139 136  K 4.0 3.6  CL 103 102  CO2 27 27  GLUCOSE 160* 122*  BUN 11 10  CREATININE 0.64 0.61  CALCIUM 9.4 8.7   LFT  Recent Labs  04/26/13 1538 04/27/13 0318  PROT 6.3 5.3*  ALBUMIN 3.3* 2.8*  AST 25 21  ALT 13 11  ALKPHOS 142* 121*  BILITOT 1.5* 1.3*  BILIDIR 0.4*  --   IBILI 1.1*  --    Admitting lipase: 507  Studies/Results: Ct Abdomen Pelvis W Contrast  04/26/2013   *RADIOLOGY REPORT*  Clinical Data: Abdominal pain.  Pancreatitis.  CT ABDOMEN AND PELVIS WITH CONTRAST  Technique:  Multidetector CT imaging of the abdomen and pelvis was performed following the standard protocol during bolus administration of intravenous contrast.  Contrast: 50mL OMNIPAQUE IOHEXOL 300 MG/ML  SOLN, OMNIPAQUE IOHEXOL 300 MG/ML  SOLN  Comparison: CT of the abdomen and pelvis 03/26/2012.  Findings:  Lung Bases: Incompletely visualized 6 mm nodule in the medial segment of the right middle lobe (image 1 of series 3).  Abdomen/Pelvis:  Mild fatty atrophy of the pancreas.  There is some very subtle haziness in the peripancreatic fat, which could represent a very  early manifestation of pancreatic inflammation in the setting of pancreatitis.  However, the pancreatic parenchyma appears to enhance normally.  No abnormal peripancreatic fluid collections are identified at this time to suggest developing pseudocyst.  Pancreatic duct does not appear dilated.  Common bile duct is normal in caliber.  There are multiple calcified gallstones layering dependently in the neck of the gallbladder.  Gallbladder appears moderately distended, without obvious wall thickening, pericholecystic fluid or stranding to suggest acute cholecystitis at this time.  1.5 cm low attenuation lesion in the central aspect of segment 8 of the liver is unchanged, and most compatible with a small cyst.  The liver has a very slightly shrunken appearance and nodular contour, compatible with early changes of cirrhosis.  Spleen is enlarged measuring 14.7 x 8.0 x 14.7 cm (estimated splenic volume of 864  ml).  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 There are other subcentimeter low attenuation lesions in the kidneys bilaterally which are too small to definitively characterize, but are statistically favored to represent tiny cysts.  A trace volume of free fluid in the pelvis.  No larger volume of ascites.  No pneumoperitoneum.  Normal appendix.  No pathologic distension of small bowel.  Status post hysterectomy.  Ovaries are unremarkable in appearance.  No definite pathologic lymphadenopathy identified within the abdomen or pelvis.  The tiny umbilical hernia containing only a small amount of omental fat incidentally noted.  Musculoskeletal: There are no aggressive appearing lytic or blastic lesions noted in the visualized portions of the skeleton.  IMPRESSION:  1.  Very subtle haziness in the peripancreatic fat is the only potential finding of pancreatic inflammation on today's examination.  Pancreatic parenchyma enhances normally,  there is no peripancreatic fluid collection to suggest developing pseudocyst at this time. 2.  Cholelithiasis without findings to suggest acute cholecystitis. Additionally, no ductal stones are noted within the distal common bile duct and there is no intrahepatic biliary ductal dilatation. 3.  Stigmata of early cirrhosis redemonstrated.  Associated with this there is moderate splenomegaly. 4.  Trace volume of ascites. 5.  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 6.  6 mm nodule incompletely visualized in the medial segment of the right middle lobe. If the patient is at high risk for bronchogenic carcinoma, follow-up chest CT at 6-12 months is recommended.  If the patient is at low risk for bronchogenic carcinoma, follow-up chest CT at 12 months is recommended.  This recommendation follows the consensus statement: Guidelines for Management of Small Pulmonary Nodules Detected on CT Scans: A Statement from the Fleischner Society as published in Radiology 2005; 237:395-400. 7.  Additional incidental findings, as above.   Original Report Authenticated By: Trudie Reed, M.D.   Dg Chest Portable 1 View  04/26/2013   *RADIOLOGY REPORT*  Clinical Data: Chest pain  PORTABLE CHEST - 1 VIEW  Comparison:  03/20/2013  Findings: Cardiomegaly is noted.  No acute infiltrate or pleural effusion.  No pulmonary edema.  IMPRESSION: Cardiomegaly.  No active disease.   Original Report Authenticated By: Natasha Mead, M.D.    Impression: 72 year old female with a history of NASH cirrhosis, presenting with acute onset pancreatitis with lipase 507, CT showing very subtle haziness in peripancreatic fat. Known gallstones also identified on CT, without ductal dilation or evidence of cholecystitis. Overall, liver profile has remained stable for her. Clinically, she is much improved, wants to eat, and denies any pain. Unclear etiology at this  time. She does not drink alcohol, and her only change in medication has been the addition of Lasix a few weeks ago. Unable to rule out gallstone pancreatitis at this time, but she does not appear to have signs of acute cholecystitis. Would recommend EUS as an outpatient for further evaluation.   Plan: Recheck lipase now Lipase and HPF in am as planned Full liquid diet for lunch EUS as outpatient Hopeful d/c in next 24-48 hours if she continues to improve EGD for variceal surveillance in Nov 2014.  Nira Retort, ANP-BC [facility]  10:12 AM    LOS: 1 day    04/27/2013, 8:25 AM   Attending note:  Patient seen and examined. CT reviewed. Appears patient  has suffered a bout of mild, uncomplicated pancreatitis. Apparently first episode. No obvious etiology. Gallstones may be a contributing factor. Need to assess triglyceride level and evaluate her further for an occult pancreatic malignancy.  Will check a fasting lipid panel. Plan for an EUS in 4-6 weeks.

## 2013-04-27 NOTE — Progress Notes (Signed)
[name] [name] JYN:[id number] DOB: May 27, 1941 DOA: 04/26/2013 PCP: Colette Ribas, MD   Subjective: This lady feels significantly better. She has been able to tolerate a clear fluid diet. CT scan does show the presence of gallstones and I wonder whether this is really is the cause of her pancreatitis.           Physical Exam: Blood pressure 103/61, pulse 74, temperature 98.5 F (36.9 C), temperature source Oral, resp. rate 16, height 5\' 5"  (1.651 m), weight 109.362 kg (241 lb 1.6 oz), SpO2 95.00%. She looks systemically well. Abdomen is soft and nontender. Heart sounds are present without murmurs or added sounds. Lung fields are clear. She is alert and oriented.   Investigations:  Recent Results (from the past 240 hour(s))  MRSA PCR SCREENING     Status: None   Collection Time    04/26/13 11:59 PM      Result Value Range Status   MRSA by PCR NEGATIVE  NEGATIVE Final   Comment:            The GeneXpert MRSA Assay (FDA     approved for NASAL specimens     only), is one component of a     comprehensive MRSA colonization     surveillance program. It is not     intended to diagnose MRSA     infection nor to guide or     monitor treatment for     MRSA infections.     Basic Metabolic Panel:  Recent Labs  86/57/84 1442 04/27/13 0318  NA 139 136  K 4.0 3.6  CL 103 102  CO2 27 27  GLUCOSE 160* 122*  BUN 11 10  CREATININE 0.64 0.61  CALCIUM 9.4 8.7   Liver Function Tests:  Recent Labs  04/26/13 1538 04/27/13 0318  AST 25 21  ALT 13 11  ALKPHOS 142* 121*  BILITOT 1.5* 1.3*  PROT 6.3 5.3*  ALBUMIN 3.3* 2.8*     CBC:  Recent Labs  04/26/13 1442 04/27/13 0318  WBC 3.8* 3.1*  NEUTROABS 2.6  --   HGB 13.9 11.5*  HCT 41.5 34.2*  MCV 82.8 82.6  PLT 122* PLATELET COUNT CONFIRMED BY SMEAR    Ct Abdomen Pelvis W Contrast  04/26/2013   *RADIOLOGY REPORT*  Clinical Data: Abdominal pain.  Pancreatitis.  CT ABDOMEN AND PELVIS WITH CONTRAST  Technique:   Multidetector CT imaging of the abdomen and pelvis was performed following the standard protocol during bolus administration of intravenous contrast.  Contrast: 50mL OMNIPAQUE IOHEXOL 300 MG/ML  SOLN, OMNIPAQUE IOHEXOL 300 MG/ML  SOLN  Comparison: CT of the abdomen and pelvis 03/26/2012.  Findings:  Lung Bases: Incompletely visualized 6 mm nodule in the medial segment of the right middle lobe (image 1 of series 3).  Abdomen/Pelvis:  Mild fatty atrophy of the pancreas.  There is some very subtle haziness in the peripancreatic fat, which could represent a very early manifestation of pancreatic inflammation in the setting of pancreatitis.  However, the pancreatic parenchyma appears to enhance normally.  No abnormal peripancreatic fluid collections are identified at this time to suggest developing pseudocyst.  Pancreatic duct does not appear dilated.  Common bile duct is normal in caliber.  There are multiple calcified gallstones layering dependently in the neck of the gallbladder.  Gallbladder appears moderately distended, without obvious wall thickening, pericholecystic fluid or stranding to suggest acute cholecystitis at this time.  1.5 cm low attenuation lesion in the central aspect of  segment 8 of the liver is unchanged, and most compatible with a small cyst.  The liver has a very slightly shrunken appearance and nodular contour, compatible with early changes of cirrhosis.  Spleen is enlarged measuring 14.7 x 8.0 x 14.7 cm (estimated splenic volume of 864 ml).  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 There are other subcentimeter low attenuation lesions in the kidneys bilaterally which are too small to definitively characterize, but are statistically favored to represent tiny cysts.  A trace volume of free fluid in the pelvis.  No larger volume of ascites.  No pneumoperitoneum.  Normal appendix.  No pathologic distension of  small bowel.  Status post hysterectomy.  Ovaries are unremarkable in appearance.  No definite pathologic lymphadenopathy identified within the abdomen or pelvis.  The tiny umbilical hernia containing only a small amount of omental fat incidentally noted.  Musculoskeletal: There are no aggressive appearing lytic or blastic lesions noted in the visualized portions of the skeleton.  IMPRESSION:  1.  Very subtle haziness in the peripancreatic fat is the only potential finding of pancreatic inflammation on today's examination.  Pancreatic parenchyma enhances normally, there is no peripancreatic fluid collection to suggest developing pseudocyst at this time. 2.  Cholelithiasis without findings to suggest acute cholecystitis. Additionally, no ductal stones are noted within the distal common bile duct and there is no intrahepatic biliary ductal dilatation. 3.  Stigmata of early cirrhosis redemonstrated.  Associated with this there is moderate splenomegaly. 4.  Trace volume of ascites. 5.  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 6.  6 mm nodule incompletely visualized in the medial segment of the right middle lobe. If the patient is at high risk for bronchogenic carcinoma, follow-up chest CT at 6-12 months is recommended.  If the patient is at low risk for bronchogenic carcinoma, follow-up chest CT at 12 months is recommended.  This recommendation follows the consensus statement: Guidelines for Management of Small Pulmonary Nodules Detected on CT Scans: A Statement from the Fleischner Society as published in Radiology 2005; 237:395-400. 7.  Additional incidental findings, as above.   Original Report Authenticated By: Trudie Reed, M.D.   Dg Chest Portable 1 View  04/26/2013   *RADIOLOGY REPORT*  Clinical Data: Chest pain  PORTABLE CHEST - 1 VIEW  Comparison:  03/20/2013  Findings: Cardiomegaly is noted.  No acute infiltrate  or pleural effusion.  No pulmonary edema.  IMPRESSION: Cardiomegaly.  No active disease.   Original Report Authenticated By: Natasha Mead, M.D.      Medications: I have reviewed the patient's current medications.  Impression: 1. Acute pancreatitis, likely related to gallstones. 2. 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 3. Type 2 diabetes mellitus. 4. Morbid obesity. 5. Essential hypertension.     Plan: 1. Advanced diet. 2. Surgical consultation. She does not wish to have cholecystectomy at the present time but I think it is worth a discussion. 3. Await gastroenterology input.  Consultants:  Awaiting gastroenterology and surgical consultations.   Procedures:  None.   Antibiotics:  None.                   Code Status: Full code.  Family Communication: Discussed plan with patient at the bedside.   Disposition Plan: Home when medically stable.  Time spent: 20 minutes .   LOS: 1 day  Wilson Singer Pager [phone]  04/27/2013, 8:57 AM

## 2013-04-27 NOTE — Consult Note (Signed)
Reason for Consult: Gallstone pancreatitis Referring Physician: [facility] hospitalist  [name] [name] is an 72 y.o. female.  HPI: Patient presented to [facility] with epigastric abdominal pain. Her workup was consistent for acute pancreatitis. She was admitted for continued management. Patient was noted to have gallstones. She denies any alcohol. No unusual travel or exposures. Patient has been residing at [facility] for rehabilitation after a recent right humerus fracture. She currently states her pain is much improved. Pain he still the epigastric area but better. No significant radiation. She is tolerating clear liquids. She's currently being advanced on her diet. No change in bowel movements. No melena or hematochezia.  Past Medical History  Diagnosis Date  . CAD in native artery   . Thrombocytopenia, unspecified   . Unspecified hereditary and idiopathic peripheral neuropathy   . Unspecified fall   . Other and unspecified hyperlipidemia   . Unspecified essential hypertension   . Obesity, unspecified   . Shortness of breath   . Personal history of neurosis   . Intrinsic asthma, unspecified   . Allergic rhinitis, cause unspecified   . Cirrhosis of liver     NASH, afp on 06/17/12 =3.7, per pt she had hep B vaccines in 1996  . Colon adenomas 03/08/10    tcs by Dr. Jena Gauss  . Hyperplastic polyps of stomach 03/08/10    tcs by Dr. Geni Bers  . Chronic gastritis 03/09/11    egd by Dr. Jena Gauss  . History of hemorrhoids 03/08/10    tcs- internal and external  . Diverticula of colon 03/08/10    L side  . Hiatal hernia 03/08/10  . Esophagitis, erosive 03/08/10  . GERD (gastroesophageal reflux disease)   . Wears glasses   . Type II or unspecified type diabetes mellitus without mention of complication, not stated as uncontrolled   . Vertigo     Past Surgical History  Procedure Laterality Date  . Hysterectomy and btl      s/p  . Abdominal hysterectomy    . Tumor excision  2003    rt arm and left  foot  . Colonoscopy  03/08/10    Dr. [name]-->ext/int hemorrhoids, anal paipilla, rectal polyps, desc polyps, cecal polyp, left-sided diverticula. suboptiomal prep. next TCS 02/2015. multiple adenomas  . Esophagogastroduodenoscopy  03/08/10    Dr. Dellie Catholic erosive RE, small hh, antral erosions, small 1cm are of mucosal indentation along gastric body of doubtful significance, cystic nodularity of hypophyarynx, base of tongue, base of epiglottis. benign gastric biopsies  . Tubal ligation    . Esophagogastroduodenoscopy  10/08/2011    Dr. Dionisio David esophageal varices, antral erosion. next egd 09/2013  . Foot surgery  2003    lt foot  . Breast surgery    . Eye surgery      cataracts  . Orif humerus fracture Right 02/17/2013    Procedure: OPEN REDUCTION INTERNAL FIXATION (ORIF) RIGHT PROXIMAL HUMERUS FRACTURE;  Surgeon: Budd Palmer, MD;  Location: [facility] OR;  Service: Orthopedics;  Laterality: Right;  . Partial gastrectomy    . Cardiac catheterization  02/25/2007    Dr. Clarene Duke:  normal LV systolic function, mild irregularities of LAD    Family History  Problem Relation Age of Onset  . Coronary artery disease      FH  . Diabetes      FH  . Heart disease Mother   . Colon cancer Neg Hx     Social History:  reports that she has never smoked. She does not  have any smokeless tobacco history on file. She reports that she does not drink alcohol or use illicit drugs.  Allergies:  Allergies  Allergen Reactions  . Morphine And Related Itching  . Prochlorperazine Edisylate Other (See Comments)    Causes anxiety/numbness  . Compazine (Prochlorperazine Edisylate) Anxiety    Causes anxiety & nervousness    Medications:  I have reviewed the patient's current medications. Prior to Admission:  Prescriptions prior to admission  Medication Sig Dispense Refill  . ALPRAZolam (XANAX) 0.5 MG tablet Take 0.5 mg by mouth 3 (three) times daily as needed for anxiety.      Marland Kitchen aspirin (BAYER LOW STRENGTH) 81  MG EC tablet Take 81 mg by mouth daily.        . budesonide-formoterol (SYMBICORT) 160-4.5 MCG/ACT inhaler Inhale 2 puffs into the lungs 2 (two) times daily.        . diphenhydrAMINE (BENADRYL) 25 mg capsule Take 25 mg by mouth every 4 (four) hours as needed for itching.      . docusate sodium (COLACE) 100 MG capsule Take 100 mg by mouth daily.      . enalapril (VASOTEC) 5 MG tablet Take 5 mg by mouth daily.        . insulin aspart (NOVOLOG) 100 UNIT/ML injection Inject 10-40 Units into the skin daily as needed. Per sliding scale. Patient injects 10-40 units for levels above 150 to 300       . insulin detemir (LEVEMIR) 100 UNIT/ML injection Inject 80 Units into the skin at bedtime.      Marland Kitchen lactulose (CHRONULAC) 10 GM/15ML solution Take 60 mLs (40 g total) by mouth 3 (three) times daily.  5400 mL  3  . linagliptin (TRADJENTA) 5 MG TABS tablet Take 5 mg by mouth daily.      . Multiple Vitamins-Minerals (CENTRUM SILVER PO) Take 1 tablet by mouth daily.       Marland Kitchen omeprazole (PRILOSEC) 40 MG capsule Take 40 mg by mouth daily.      Marland Kitchen oxyCODONE (OXYCONTIN) 10 MG 12 hr tablet Take 10 mg by mouth every 4 (four) hours as needed for pain.      . pregabalin (LYRICA) 100 MG capsule Take 100 mg by mouth 2 (two) times daily.       . furosemide (LASIX) 20 MG tablet Take 20 mg by mouth daily. Started on 04/12/2013 for 7 days      . meclizine (ANTIVERT) 25 MG tablet Take 25 mg by mouth 3 (three) times daily as needed for dizziness or nausea.       . rifaximin (XIFAXAN) 550 MG TABS Take 1 tablet (550 mg total) by mouth 2 (two) times daily.  60 tablet  3   Scheduled: . aspirin EC  81 mg Oral Daily  . budesonide-formoterol  2 puff Inhalation BID  . enalapril  5 mg Oral Daily  . furosemide  20 mg Oral BID  . heparin  5,000 Units Subcutaneous Q8H  . insulin aspart  0-20 Units Subcutaneous TID WC  . insulin aspart  0-5 Units Subcutaneous QHS  . insulin detemir  60 Units Subcutaneous QHS  . lactulose  40 g Oral TID   . linagliptin  5 mg Oral Daily  . loratadine  10 mg Oral Daily  . OxyCODONE  10 mg Oral Q12H  . pantoprazole  40 mg Oral BID  . pregabalin  100 mg Oral BID  . rifaximin  550 mg Oral BID  . sodium chloride  3  mL Intravenous Q12H   Continuous: . sodium chloride 125 mL/hr at 04/26/13 2103   ZOX:[id number], HYDROmorphone (DILAUDID) injection, meclizine, ondansetron (ZOFRAN) IV, ondansetron Anti-infectives   Start     Dose/Rate Route Frequency Ordered Stop   04/26/13 2200  rifaximin (XIFAXAN) tablet 550 mg     550 mg Oral 2 times daily 04/26/13 1708        Results for orders placed during the hospital encounter of 04/26/13 (from the past 48 hour(s))  BASIC METABOLIC PANEL     Status: Abnormal   Collection Time    04/26/13  2:42 PM      Result Value Range   Sodium 139  135 - 145 mEq/L   Potassium 4.0  3.5 - 5.1 mEq/L   Chloride 103  96 - 112 mEq/L   CO2 27  19 - 32 mEq/L   Glucose, Bld 160 (*) 70 - 99 mg/dL   BUN 11  6 - 23 mg/dL   Creatinine, Ser 1.19  0.50 - 1.10 mg/dL   Calcium 9.4  8.4 - 14.7 mg/dL   GFR calc non Af Amer 89 (*) >90 mL/min   GFR calc Af Amer >90  >90 mL/min   Comment:            The eGFR has been calculated     using the CKD EPI equation.     This calculation has not been     validated in all clinical     situations.     eGFR's persistently     <90 mL/min signify     possible Chronic Kidney Disease.  TROPONIN I     Status: None   Collection Time    04/26/13  2:42 PM      Result Value Range   Troponin I <0.30  <0.30 ng/mL   Comment:            Due to the release kinetics of cTnI,     a negative result within the first hours     of the onset of symptoms does not rule out     myocardial infarction with certainty.     If myocardial infarction is still suspected,     repeat the test at appropriate intervals.  CBC WITH DIFFERENTIAL     Status: Abnormal   Collection Time    04/26/13  2:42 PM      Result Value Range   WBC 3.8 (*) 4.0 - 10.5 K/uL    RBC 5.01  3.87 - 5.11 MIL/uL   Hemoglobin 13.9  12.0 - 15.0 g/dL   HCT 82.9  56.2 - 13.0 %   MCV 82.8  78.0 - 100.0 fL   MCH 27.7  26.0 - 34.0 pg   MCHC 33.5  30.0 - 36.0 g/dL   RDW 86.5  78.4 - 69.6 %   Platelets 122 (*) 150 - 400 K/uL   Neutrophils Relative % 66  43 - 77 %   Neutro Abs 2.6  1.7 - 7.7 K/uL   Lymphocytes Relative 25  12 - 46 %   Lymphs Abs 1.0  0.7 - 4.0 K/uL   Monocytes Relative 6  3 - 12 %   Monocytes Absolute 0.2  0.1 - 1.0 K/uL   Eosinophils Relative 2  0 - 5 %   Eosinophils Absolute 0.1  0.0 - 0.7 K/uL   Basophils Relative 1  0 - 1 %   Basophils Absolute 0.0  0.0 - 0.1 K/uL  HEPATIC  FUNCTION PANEL     Status: Abnormal   Collection Time    04/26/13  3:38 PM      Result Value Range   Total Protein 6.3  6.0 - 8.3 g/dL   Albumin 3.3 (*) 3.5 - 5.2 g/dL   AST 25  0 - 37 U/L   ALT 13  0 - 35 U/L   Alkaline Phosphatase 142 (*) 39 - 117 U/L   Total Bilirubin 1.5 (*) 0.3 - 1.2 mg/dL   Bilirubin, Direct 0.4 (*) 0.0 - 0.3 mg/dL   Indirect Bilirubin 1.1 (*) 0.3 - 0.9 mg/dL  LIPASE, BLOOD     Status: Abnormal   Collection Time    04/26/13  3:38 PM      Result Value Range   Lipase 507 (*) 11 - 59 U/L  AMMONIA     Status: None   Collection Time    04/26/13  3:39 PM      Result Value Range   Ammonia 59  11 - 60 umol/L  TROPONIN I     Status: None   Collection Time    04/26/13  6:42 PM      Result Value Range   Troponin I <0.30  <0.30 ng/mL   Comment:            Due to the release kinetics of cTnI,     a negative result within the first hours     of the onset of symptoms does not rule out     myocardial infarction with certainty.     If myocardial infarction is still suspected,     repeat the test at appropriate intervals.  GLUCOSE, CAPILLARY     Status: Abnormal   Collection Time    04/26/13  8:41 PM      Result Value Range   Glucose-Capillary 142 (*) 70 - 99 mg/dL  MRSA PCR SCREENING     Status: None   Collection Time    04/26/13 11:59 PM      Result  Value Range   MRSA by PCR NEGATIVE  NEGATIVE   Comment:            The GeneXpert MRSA Assay (FDA     approved for NASAL specimens     only), is one component of a     comprehensive MRSA colonization     surveillance program. It is not     intended to diagnose MRSA     infection nor to guide or     monitor treatment for     MRSA infections.  COMPREHENSIVE METABOLIC PANEL     Status: Abnormal   Collection Time    04/27/13  3:18 AM      Result Value Range   Sodium 136  135 - 145 mEq/L   Potassium 3.6  3.5 - 5.1 mEq/L   Chloride 102  96 - 112 mEq/L   CO2 27  19 - 32 mEq/L   Glucose, Bld 122 (*) 70 - 99 mg/dL   BUN 10  6 - 23 mg/dL   Creatinine, Ser 4.54  0.50 - 1.10 mg/dL   Calcium 8.7  8.4 - 09.8 mg/dL   Total Protein 5.3 (*) 6.0 - 8.3 g/dL   Albumin 2.8 (*) 3.5 - 5.2 g/dL   AST 21  0 - 37 U/L   ALT 11  0 - 35 U/L   Alkaline Phosphatase 121 (*) 39 - 117 U/L   Total Bilirubin 1.3 (*) 0.3 -  1.2 mg/dL   GFR calc non Af Amer >90  >90 mL/min   GFR calc Af Amer >90  >90 mL/min   Comment:            The eGFR has been calculated     using the CKD EPI equation.     This calculation has not been     validated in all clinical     situations.     eGFR's persistently     <90 mL/min signify     possible Chronic Kidney Disease.  CBC     Status: Abnormal   Collection Time    04/27/13  3:18 AM      Result Value Range   WBC 3.1 (*) 4.0 - 10.5 K/uL   RBC 4.14  3.87 - 5.11 MIL/uL   Hemoglobin 11.5 (*) 12.0 - 15.0 g/dL   Comment: DELTA CHECK NOTED      SPOKE WITH NURSE(CAROLINE)ABOUT HGB DECREASE   HCT 34.2 (*) 36.0 - 46.0 %   MCV 82.6  78.0 - 100.0 fL   MCH 27.8  26.0 - 34.0 pg   MCHC 33.6  30.0 - 36.0 g/dL   RDW 95.6  21.3 - 08.6 %   Platelets PLATELET COUNT CONFIRMED BY SMEAR  150 - 400 K/uL   Comment: CORRECTED ON 06/16 AT 0341: PREVIOUSLY REPORTED AS 105  LIPASE, BLOOD     Status: Abnormal   Collection Time    04/27/13  3:18 AM      Result Value Range   Lipase 67 (*) 11 - 59  U/L  TROPONIN I     Status: None   Collection Time    04/27/13  3:19 AM      Result Value Range   Troponin I <0.30  <0.30 ng/mL   Comment:            Due to the release kinetics of cTnI,     a negative result within the first hours     of the onset of symptoms does not rule out     myocardial infarction with certainty.     If myocardial infarction is still suspected,     repeat the test at appropriate intervals.    Ct Abdomen Pelvis W Contrast  04/26/2013   *RADIOLOGY REPORT*  Clinical Data: Abdominal pain.  Pancreatitis.  CT ABDOMEN AND PELVIS WITH CONTRAST  Technique:  Multidetector CT imaging of the abdomen and pelvis was performed following the standard protocol during bolus administration of intravenous contrast.  Contrast: 50mL OMNIPAQUE IOHEXOL 300 MG/ML  SOLN, OMNIPAQUE IOHEXOL 300 MG/ML  SOLN  Comparison: CT of the abdomen and pelvis 03/26/2012.  Findings:  Lung Bases: Incompletely visualized 6 mm nodule in the medial segment of the right middle lobe (image 1 of series 3).  Abdomen/Pelvis:  Mild fatty atrophy of the pancreas.  There is some very subtle haziness in the peripancreatic fat, which could represent a very early manifestation of pancreatic inflammation in the setting of pancreatitis.  However, the pancreatic parenchyma appears to enhance normally.  No abnormal peripancreatic fluid collections are identified at this time to suggest developing pseudocyst.  Pancreatic duct does not appear dilated.  Common bile duct is normal in caliber.  There are multiple calcified gallstones layering dependently in the neck of the gallbladder.  Gallbladder appears moderately distended, without obvious wall thickening, pericholecystic fluid or stranding to suggest acute cholecystitis at this time.  1.5 cm low attenuation lesion in the central aspect of  segment 8 of the liver is unchanged, and most compatible with a small cyst.  The liver has a very slightly shrunken appearance and nodular  contour, compatible with early changes of cirrhosis.  Spleen is enlarged measuring 14.7 x 8.0 x 14.7 cm (estimated splenic volume of 864 ml).  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 There are other subcentimeter low attenuation lesions in the kidneys bilaterally which are too small to definitively characterize, but are statistically favored to represent tiny cysts.  A trace volume of free fluid in the pelvis.  No larger volume of ascites.  No pneumoperitoneum.  Normal appendix.  No pathologic distension of small bowel.  Status post hysterectomy.  Ovaries are unremarkable in appearance.  No definite pathologic lymphadenopathy identified within the abdomen or pelvis.  The tiny umbilical hernia containing only a small amount of omental fat incidentally noted.  Musculoskeletal: There are no aggressive appearing lytic or blastic lesions noted in the visualized portions of the skeleton.  IMPRESSION:  1.  Very subtle haziness in the peripancreatic fat is the only potential finding of pancreatic inflammation on today's examination.  Pancreatic parenchyma enhances normally, there is no peripancreatic fluid collection to suggest developing pseudocyst at this time. 2.  Cholelithiasis without findings to suggest acute cholecystitis. Additionally, no ductal stones are noted within the distal common bile duct and there is no intrahepatic biliary ductal dilatation. 3.  Stigmata of early cirrhosis redemonstrated.  Associated with this there is moderate splenomegaly. 4.  Trace volume of ascites. 5.  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 6.  6 mm nodule incompletely visualized in the medial segment of the right middle lobe. If the patient is at high risk for bronchogenic carcinoma, follow-up chest CT at 6-12 months is recommended.  If the patient is at  low risk for bronchogenic carcinoma, follow-up chest CT at 12 months is recommended.  This recommendation follows the consensus statement: Guidelines for Management of Small Pulmonary Nodules Detected on CT Scans: A Statement from the Fleischner Society as published in Radiology 2005; 237:395-400. 7.  Additional incidental findings, as above.   Original Report Authenticated By: Trudie Reed, M.D.   Dg Chest Portable 1 View  04/26/2013   *RADIOLOGY REPORT*  Clinical Data: Chest pain  PORTABLE CHEST - 1 VIEW  Comparison:  03/20/2013  Findings: Cardiomegaly is noted.  No acute infiltrate or pleural effusion.  No pulmonary edema.  IMPRESSION: Cardiomegaly.  No active disease.   Original Report Authenticated By: Natasha Mead, M.D.    Review of Systems  Constitutional: Negative for fever and chills.  HENT: Negative.   Eyes: Negative.   Respiratory: Negative.   Cardiovascular: Negative.   Gastrointestinal: Positive for heartburn, nausea and abdominal pain (epigastric). Negative for vomiting, diarrhea, constipation, blood in stool and melena.  Genitourinary: Negative.   Musculoskeletal: Negative.   Skin: Negative.   Neurological: Negative.   Endo/Heme/Allergies: Negative.   Psychiatric/Behavioral: Negative.    Blood pressure 103/61, pulse 74, temperature 98.5 F (36.9 C), temperature source Oral, resp. rate 16, height 5\' 5"  (1.651 m), weight 109.362 kg (241 lb 1.6 oz), SpO2 95.00%. Physical Exam  Constitutional: She is oriented to person, place, and time. She appears well-developed and well-nourished. No distress.  HENT:  Head: Normocephalic and atraumatic.  Eyes: Conjunctivae and EOM are normal. Pupils are equal, round,  and reactive to light. No scleral icterus.  Neck: Normal range of motion. Neck supple. No tracheal deviation present. No thyromegaly present.  Cardiovascular: Normal rate, regular rhythm and normal heart sounds.   Respiratory: Effort normal.  GI: Soft. Bowel sounds are normal.  She exhibits no distension and no mass. There is tenderness (mild epigastric). There is no rebound and no guarding.  Lymphadenopathy:    She has no cervical adenopathy.  Neurological: She is alert and oriented to person, place, and time.  Skin: Skin is warm and dry.    Assessment/Plan: Acute pancreatitis likely gallstone etiology. Surgical options were discussed at length the patient. At this time patient does wish to complete her rehabilitation from her humeral fracture prior to proceeding with any cholecystectomy. I do feel it is reasonable this time to continue to advance patient as tolerated. Should her enzymes continue to normalize and she tolerated regular diet she can be discharged with plans to followup for elective cholecystectomy discussion as an outpatient. I'll continue to follow her course while she is hospitalized.  Brystal Kildow C 04/27/2013, 10:18 AM

## 2013-04-27 NOTE — Progress Notes (Signed)
Utilization Review Complete  

## 2013-04-27 NOTE — Telephone Encounter (Signed)
Currently inpatient for initial bout with pancreatitis.  Needs outpatient EUS in 4-6 weeks with Dr. Christella Hartigan for further assessment.

## 2013-04-27 NOTE — Clinical Social Work Psychosocial (Signed)
Clinical Social Work Department BRIEF PSYCHOSOCIAL ASSESSMENT 04/27/2013  Patient:  [name] [name], [name] [name]     Account Number:  [id number]     Admit date:  04/26/2013  Clinical Social Worker:  Nancie Neas  Date/Time:  04/27/2013 10:10 AM  Referred by:  Physician  Date Referred:  04/27/2013 Referred for  SNF Placement   Other Referral:   Interview type:  Patient Other interview type:    PSYCHOSOCIAL DATA Living Status:  FACILITY Admitted from facility:  [facility] Level of care:  Skilled Nursing Facility Primary support name:  Lyda Jester Primary support relationship to patient:  SPOUSE Degree of support available:   supportive    CURRENT CONCERNS Current Concerns  Post-Acute Placement   Other Concerns:    SOCIAL WORK ASSESSMENT / PLAN CSW met with pt at bedside. Pt alert and oriented and reports she has been a resident at [facility] since April after a right humerus fracture. Pt states her plan has been to d/c home from SNF on 6/25 and she is eager to do so. Therapy has been going well. Pt said her husband is her best support and will be available to assist her as needed once she returns home. Per Eunice Blase at [facility], pt is okay to return at d/c.   Assessment/plan status:  Psychosocial Support/Ongoing Assessment of Needs Other assessment/ plan:   Information/referral to community resources:   [facility]    PATIENT'S/FAMILY'S RESPONSE TO PLAN OF CARE: Pt reports positive feelings regarding return to [facility] when medically stable in order to complete therapy prior to returning home. CSW will continue to follow.       Derenda Fennel, [place] [phone]

## 2013-04-28 ENCOUNTER — Other Ambulatory Visit: Payer: Self-pay

## 2013-04-28 DIAGNOSIS — K7460 Unspecified cirrhosis of liver: Secondary | ICD-10-CM

## 2013-04-28 DIAGNOSIS — K859 Acute pancreatitis without necrosis or infection, unspecified: Secondary | ICD-10-CM

## 2013-04-28 LAB — CBC
Hemoglobin: 11.9 g/dL — ABNORMAL LOW (ref 12.0–15.0)
MCH: 27.2 pg (ref 26.0–34.0)
MCV: 83.5 fL (ref 78.0–100.0)
Platelets: 95 10*3/uL — ABNORMAL LOW (ref 150–400)
RBC: 4.37 MIL/uL (ref 3.87–5.11)
WBC: 5.2 10*3/uL (ref 4.0–10.5)

## 2013-04-28 LAB — COMPREHENSIVE METABOLIC PANEL
ALT: 11 U/L (ref 0–35)
AST: 23 U/L (ref 0–37)
CO2: 26 mEq/L (ref 19–32)
Calcium: 8.3 mg/dL — ABNORMAL LOW (ref 8.4–10.5)
Chloride: 108 mEq/L (ref 96–112)
GFR calc Af Amer: >90 mL/min (ref >90)
GFR calc non Af Amer: >90 mL/min (ref >90)
Glucose, Bld: 120 mg/dL — ABNORMAL HIGH (ref 70–99)
Sodium: 141 mEq/L (ref 135–145)
Total Bilirubin: 1.5 mg/dL — ABNORMAL HIGH (ref 0.3–1.2)

## 2013-04-28 LAB — LIPID PANEL
LDL Cholesterol: 62 mg/dL (ref 0–99)
Triglycerides: 53 mg/dL (ref <150)
VLDL: 11 mg/dL (ref 0–40)

## 2013-04-28 LAB — GLUCOSE, CAPILLARY: Glucose-Capillary: 100 mg/dL — ABNORMAL HIGH (ref 70–99)

## 2013-04-28 NOTE — Discharge Summary (Signed)
Physician Discharge Summary  [name] [name] UJW:[id number] DOB: 25-Feb-1941 DOA: 04/26/2013  PCP: [name] Ribas, MD  Admit date: 04/26/2013 Discharge date: 04/28/2013  Time spent: Greater than 30 minutes  Recommendations for Outpatient Follow-up:  1. Followup with Dr. Leticia [name], surgery in the outpatient setting for consideration of cholecystectomy. 2. Follow with Dr. Kendell [name], gastroenterology for endoscopic ultrasound. Dr. Kendell [name] office this will arrange this.   Discharge Diagnoses:  1. Acute pancreatitis, unclear etiology, possibly related to gallstones. 2. Cholelithiasis. 3. Liver cirrhosis secondary to NASH. 4. Type 2 diabetes mellitus. 5. Hypertension. 6. Obesity.   Discharge Condition: Stable and improved.  Diet recommendation: Carbohydrate modified diet.  Filed Weights   04/26/13 1426 04/26/13 1930 04/27/13 0434  Weight: 104.327 kg (230 lb) 108.455 kg (239 lb 1.6 oz) 109.362 kg (241 lb 1.6 oz)    History of present illness:  This very pleasant 72 year old lady presented to the hospital with symptoms of epigastric pain radiating to the back. Please see initial history as outlined below: HPI: [name] [name] is a 72 y.o. female who presents to the hospital with an episode of epigastric pain which started approximately 5 hours ago, radiating to the back. She did not experience any vomiting but felt that she was anorexic. Apparently the pain is gone now after she had nitroglycerin and aspirin. She does have a history of coronary artery disease but has never been told that she has had an MI. 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 She is morbidly obese. She has hypertension. She has diabetes type 2. She is a nonsmoker. She does not apparently have a history of gallstones.  Hospital Course:  Patient was treated with intravenous fluids, analgesia, antiemetics and initially a clear fluid diet. She tolerated this very well and her pain subsided very quickly. Her  diet was advanced and she tolerated this also. She was seen by Dr. Kendell [name], gastroenterology and Dr. Leticia [name], surgery in view of the finding of gallstones on her CT scan. Dr. Kendell [name] will make arrangements for the patient to have endoscopic ultrasound and Dr. Leticia [name] will discuss further as an outpatient the need for cholecystectomy. She is now stable for discharge.  Procedures:  None.   Consultations:  Gastroenterology, Dr. Kendell [name].  Surgery, Dr. Leticia [name].  Discharge Exam: Filed Vitals:   04/27/13 1925 04/27/13 2200 04/28/13 0558 04/28/13 0734  BP:  125/77 139/56   Pulse:  82 84   Temp:  98.7 F (37.1 C) 99 F (37.2 C)   TempSrc:  Oral Axillary   Resp:  16 18   Height:      Weight:      SpO2: 97% 94% 99% 90%    General: She looks systemically well. She does not appear to be in pain. Cardiovascular: Heart sounds are present and normal without murmurs or added sounds. Respiratory: Lung fields are clear. Abdomen is soft and nontender. She is alert and orientated  Discharge Instructions  Discharge Orders   Future Orders Complete By Expires     Diet - low sodium heart healthy  As directed     Increase activity slowly  As directed         Medication List    TAKE these medications       ALPRAZolam 0.5 MG tablet  Commonly known as:  XANAX  Take 0.5 mg by mouth 3 (three) times daily as needed for anxiety.     BAYER LOW STRENGTH 81 MG EC tablet  Generic drug:  aspirin  Take 81  mg by mouth daily.     budesonide-formoterol 160-4.5 MCG/ACT inhaler  Commonly known as:  SYMBICORT  Inhale 2 puffs into the lungs 2 (two) times daily.     CENTRUM SILVER PO  Take 1 tablet by mouth daily.     diphenhydrAMINE 25 mg capsule  Commonly known as:  BENADRYL  Take 25 mg by mouth every 4 (four) hours as needed for itching.     docusate sodium 100 MG capsule  Commonly known as:  COLACE  Take 100 mg by mouth daily.     enalapril 5 MG tablet  Commonly known as:  VASOTEC  Take 5 mg  by mouth daily.     furosemide 20 MG tablet  Commonly known as:  LASIX  Take 20 mg by mouth daily. Started on 04/12/2013 for 7 days     insulin aspart 100 UNIT/ML injection  Commonly known as:  novoLOG  Inject 10-40 Units into the skin daily as needed. Per sliding scale. Patient injects 10-40 units for levels above 150 to 300     insulin detemir 100 UNIT/ML injection  Commonly known as:  LEVEMIR  Inject 80 Units into the skin at bedtime.     lactulose 10 GM/15ML solution  Commonly known as:  CHRONULAC  Take 60 mLs (40 g total) by mouth 3 (three) times daily.     meclizine 25 MG tablet  Commonly known as:  ANTIVERT  Take 25 mg by mouth 3 (three) times daily as needed for dizziness or nausea.     omeprazole 40 MG capsule  Commonly known as:  PRILOSEC  Take 40 mg by mouth daily.     oxyCODONE 10 MG 12 hr tablet  Commonly known as:  OXYCONTIN  Take 10 mg by mouth every 4 (four) hours as needed for pain.     pregabalin 100 MG capsule  Commonly known as:  LYRICA  Take 100 mg by mouth 2 (two) times daily.     rifaximin 550 MG Tabs  Commonly known as:  XIFAXAN  Take 1 tablet (550 mg total) by mouth 2 (two) times daily.     TRADJENTA 5 MG Tabs tablet  Generic drug:  linagliptin  Take 5 mg by mouth daily.       Allergies  Allergen Reactions  . Morphine And Related Itching  . Prochlorperazine Edisylate Other (See Comments)    Causes anxiety/numbness  . Compazine (Prochlorperazine Edisylate) Anxiety    Causes anxiety & nervousness      The results of significant diagnostics from this hospitalization (including imaging, microbiology, ancillary and laboratory) are listed below for reference.    Significant Diagnostic Studies: Ct Abdomen Pelvis W Contrast  04/26/2013   *RADIOLOGY REPORT*  Clinical Data: Abdominal pain.  Pancreatitis.  CT ABDOMEN AND PELVIS WITH CONTRAST  Technique:  Multidetector CT imaging of the abdomen and pelvis was performed following the standard  protocol during bolus administration of intravenous contrast.  Contrast: 50mL OMNIPAQUE IOHEXOL 300 MG/ML  SOLN, OMNIPAQUE IOHEXOL 300 MG/ML  SOLN  Comparison: CT of the abdomen and pelvis 03/26/2012.  Findings:  Lung Bases: Incompletely visualized 6 mm nodule in the medial segment of the right middle lobe (image 1 of series 3).  Abdomen/Pelvis:  Mild fatty atrophy of the pancreas.  There is some very subtle haziness in the peripancreatic fat, which could represent a very early manifestation of pancreatic inflammation in the setting of pancreatitis.  However, the pancreatic parenchyma appears to enhance normally.  No abnormal  peripancreatic fluid collections are identified at this time to suggest developing pseudocyst.  Pancreatic duct does not appear dilated.  Common bile duct is normal in caliber.  There are multiple calcified gallstones layering dependently in the neck of the gallbladder.  Gallbladder appears moderately distended, without obvious wall thickening, pericholecystic fluid or stranding to suggest acute cholecystitis at this time.  1.5 cm low attenuation lesion in the central aspect of segment 8 of the liver is unchanged, and most compatible with a small cyst.  The liver has a very slightly shrunken appearance and nodular contour, compatible with early changes of cirrhosis.  Spleen is enlarged measuring 14.7 x 8.0 x 14.7 cm (estimated splenic volume of 864 ml).  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 There are other subcentimeter low attenuation lesions in the kidneys bilaterally which are too small to definitively characterize, but are statistically favored to represent tiny cysts.  A trace volume of free fluid in the pelvis.  No larger volume of ascites.  No pneumoperitoneum.  Normal appendix.  No pathologic distension of small bowel.  Status post hysterectomy.  Ovaries are unremarkable in appearance.  No  definite pathologic lymphadenopathy identified within the abdomen or pelvis.  The tiny umbilical hernia containing only a small amount of omental fat incidentally noted.  Musculoskeletal: There are no aggressive appearing lytic or blastic lesions noted in the visualized portions of the skeleton.  IMPRESSION:  1.  Very subtle haziness in the peripancreatic fat is the only potential finding of pancreatic inflammation on today's examination.  Pancreatic parenchyma enhances normally, there is no peripancreatic fluid collection to suggest developing pseudocyst at this time. 2.  Cholelithiasis without findings to suggest acute cholecystitis. Additionally, no ductal stones are noted within the distal common bile duct and there is no intrahepatic biliary ductal dilatation. 3.  Stigmata of early cirrhosis redemonstrated.  Associated with this there is moderate splenomegaly. 4.  Trace volume of ascites. 5.  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 6.  6 mm nodule incompletely visualized in the medial segment of the right middle lobe. If the patient is at high risk for bronchogenic carcinoma, follow-up chest CT at 6-12 months is recommended.  If the patient is at low risk for bronchogenic carcinoma, follow-up chest CT at 12 months is recommended.  This recommendation follows the consensus statement: Guidelines for Management of Small Pulmonary Nodules Detected on CT Scans: A Statement from the Fleischner Society as published in Radiology 2005; 237:395-400. 7.  Additional incidental findings, as above.   Original Report Authenticated By: Trudie Reed, M.D.   US Venous Img Lower Unilateral Left  04/14/2013   *RADIOLOGY REPORT*  Clinical Data: Left lower extremity edema and history of diabetes.  LEFT LOWER EXTREMITY VENOUS DUPLEX ULTRASOUND  Technique:  Gray-scale sonography with graded compression, as well as color Doppler and  duplex ultrasound were performed to evaluate the deep venous system of the lower extremity from the level of the common femoral vein through the popliteal and proximal calf veins. Spectral Doppler was utilized to evaluate flow at rest and with distal augmentation maneuvers.  Comparison:  None.  Findings:  Normal compressibility of the common femoral, superficial femoral, and popliteal veins is demonstrated, as well as the visualized proximal calf veins.  No filling defects to suggest DVT on grayscale or color Doppler imaging.  Doppler waveforms show normal direction of venous flow, normal respiratory phasicity and response to augmentation.  There is no evidence of superficial thrombophlebitis or abnormal fluid collection.  Subcutaneous edema is present in the left calf and ankle regions.  IMPRESSION: No evidence of left lower extremity deep vein thrombosis.   Original Report Authenticated By: Irish Lack, M.D.   Dg Chest Portable 1 View  04/26/2013   *RADIOLOGY REPORT*  Clinical Data: Chest pain  PORTABLE CHEST - 1 VIEW  Comparison:  03/20/2013  Findings: Cardiomegaly is noted.  No acute infiltrate or pleural effusion.  No pulmonary edema.  IMPRESSION: Cardiomegaly.  No active disease.   Original Report Authenticated By: Natasha Mead, M.D.    Microbiology: Recent Results (from the past 240 hour(s))  MRSA PCR SCREENING     Status: None   Collection Time    04/26/13 11:59 PM      Result Value Range Status   MRSA by PCR NEGATIVE  NEGATIVE Final   Comment:            The GeneXpert MRSA Assay (FDA     approved for NASAL specimens     only), is one component of a     comprehensive MRSA colonization     surveillance program. It is not     intended to diagnose MRSA     infection nor to guide or     monitor treatment for     MRSA infections.     Labs: Basic Metabolic Panel:  Recent Labs Lab 04/26/13 1442 04/27/13 0318 04/28/13 0600  NA 139 136 141  K 4.0 3.6 3.5  CL 103 102 108  CO2 27 27  26   GLUCOSE 160* 122* 120*  BUN 11 10 7   CREATININE 0.64 0.61 0.60  CALCIUM 9.4 8.7 8.3*   Liver Function Tests:  Recent Labs Lab 04/26/13 1538 04/27/13 0318 04/28/13 0600  AST 25 21 23   ALT 13 11 11   ALKPHOS 142* 121* 123*  BILITOT 1.5* 1.3* 1.5*  PROT 6.3 5.3* 5.3*  ALBUMIN 3.3* 2.8* 2.8*    Recent Labs Lab 04/26/13 1538 04/27/13 0318 04/28/13 0600  LIPASE 507* 67* 27    Recent Labs Lab 04/26/13 1539  AMMONIA 59   CBC:  Recent Labs Lab 04/26/13 1442 04/27/13 0318 04/28/13 0600  WBC 3.8* 3.1* 5.2  NEUTROABS 2.6  --   --   HGB 13.9 11.5* 11.9*  HCT 41.5 34.2* 36.5  MCV 82.8 82.6 83.5  PLT 122* PLATELET COUNT CONFIRMED BY SMEAR 95*   Cardiac Enzymes:  Recent Labs Lab 04/26/13 1442 04/26/13 1842 04/27/13 0319  TROPONINI <0.30 <0.30 <0.30     CBG:  Recent Labs Lab 04/27/13 0732 04/27/13 1151 04/27/13 1643 04/27/13 2030 04/28/13 0729  GLUCAP 77 110* 136* 169* 100*       Signed:  Carmencita Cusic C  [facility] 04/28/2013, 10:13 AM

## 2013-04-28 NOTE — Telephone Encounter (Signed)
[name] [name], She needs upper EUS, radial +/- linear for acute pancreaitits.  Should be in late July.  ++ MAC sedation, 60 min,  Thanks

## 2013-04-28 NOTE — Progress Notes (Signed)
Report called to Katie at [place].

## 2013-04-28 NOTE — Telephone Encounter (Signed)
Left message on machine to call back  

## 2013-04-28 NOTE — Progress Notes (Signed)
Subjective:  Feels good. Tolerating advanced diet. Wants to go home. Had six BMs yesterday on lactulose 40g TID.  Objective: Vital signs in last 24 hours: Temp:  [98.7 F (37.1 C)-99 F (37.2 C)] 99 F (37.2 C) (06/17 0558) Pulse Rate:  [74-84] 84 (06/17 0558) Resp:  [16-18] 18 (06/17 0558) BP: (103-139)/(56-77) 139/56 mmHg (06/17 0558) SpO2:  [90 %-99 %] 90 % (06/17 0734) Last BM Date: 04/27/13 General:   Alert,  Well-developed, well-nourished, pleasant and cooperative in NAD Head:  Normocephalic and atraumatic. Eyes:  Sclera clear, no icterus.   Abdomen:  Soft, nontender and nondistended. Normal bowel sounds, without guarding, and without rebound.   Extremities:  Without clubbing, deformity or edema. Neurologic:  Alert and  oriented x4;  grossly normal neurologically. Skin:  Intact without significant lesions or rashes. Psych:  Alert and cooperative. Normal mood and affect.  Intake/Output from previous day: 06/16 0701 - 06/17 0700 In: 1615 [P.O.:240; I.V.:1375] Out: 750 [Urine:750] Intake/Output this shift:    Lab Results: CBC  Recent Labs  04/26/13 1442 04/27/13 0318 04/28/13 0600  WBC 3.8* 3.1* 5.2  HGB 13.9 11.5* 11.9*  HCT 41.5 34.2* 36.5  MCV 82.8 82.6 83.5  PLT 122* PLATELET COUNT CONFIRMED BY SMEAR 95*   BMET  Recent Labs  04/26/13 1442 04/27/13 0318 04/28/13 0600  NA 139 136 141  K 4.0 3.6 3.5  CL 103 102 108  CO2 27 27 26   GLUCOSE 160* 122* 120*  BUN 11 10 7   CREATININE 0.64 0.61 0.60  CALCIUM 9.4 8.7 8.3*   LFTs  Recent Labs  04/26/13 1538 04/27/13 0318 04/28/13 0600  BILITOT 1.5* 1.3* 1.5*  BILIDIR 0.4*  --   --   IBILI 1.1*  --   --   ALKPHOS 142* 121* 123*  AST 25 21 23   ALT 13 11 11   PROT 6.3 5.3* 5.3*  ALBUMIN 3.3* 2.8* 2.8*    Recent Labs  04/26/13 1538 04/27/13 0318 04/28/13 0600  LIPASE 507* 67* 27    Lab Results  Component Value Date   CHOL 132 04/28/2013   HDL 59 04/28/2013   LDLCALC 62 04/28/2013   TRIG 53  04/28/2013   CHOLHDL 2.2 04/28/2013        Imaging Studies: Ct Abdomen Pelvis W Contrast  04/26/2013   *RADIOLOGY REPORT*  Clinical Data: Abdominal pain.  Pancreatitis.  CT ABDOMEN AND PELVIS WITH CONTRAST  Technique:  Multidetector CT imaging of the abdomen and pelvis was performed following the standard protocol during bolus administration of intravenous contrast.  Contrast: 50mL OMNIPAQUE IOHEXOL 300 MG/ML  SOLN, OMNIPAQUE IOHEXOL 300 MG/ML  SOLN  Comparison: CT of the abdomen and pelvis 03/26/2012.  Findings:  Lung Bases: Incompletely visualized 6 mm nodule in the medial segment of the right middle lobe (image 1 of series 3).  Abdomen/Pelvis:  Mild fatty atrophy of the pancreas.  There is some very subtle haziness in the peripancreatic fat, which could represent a very early manifestation of pancreatic inflammation in the setting of pancreatitis.  However, the pancreatic parenchyma appears to enhance normally.  No abnormal peripancreatic fluid collections are identified at this time to suggest developing pseudocyst.  Pancreatic duct does not appear dilated.  Common bile duct is normal in caliber.  There are multiple calcified gallstones layering dependently in the neck of the gallbladder.  Gallbladder appears moderately distended, without obvious wall thickening, pericholecystic fluid or stranding to suggest acute cholecystitis at this time.  1.5 cm low attenuation  lesion in the central aspect of segment 8 of the liver is unchanged, and most compatible with a small cyst.  The liver has a very slightly shrunken appearance and nodular contour, compatible with early changes of cirrhosis.  Spleen is enlarged measuring 14.7 x 8.0 x 14.7 cm (estimated splenic volume of 864 ml).  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 There are other subcentimeter low attenuation lesions in the kidneys bilaterally which are too small to  definitively characterize, but are statistically favored to represent tiny cysts.  A trace volume of free fluid in the pelvis.  No larger volume of ascites.  No pneumoperitoneum.  Normal appendix.  No pathologic distension of small bowel.  Status post hysterectomy.  Ovaries are unremarkable in appearance.  No definite pathologic lymphadenopathy identified within the abdomen or pelvis.  The tiny umbilical hernia containing only a small amount of omental fat incidentally noted.  Musculoskeletal: There are no aggressive appearing lytic or blastic lesions noted in the visualized portions of the skeleton.  IMPRESSION:  1.  Very subtle haziness in the peripancreatic fat is the only potential finding of pancreatic inflammation on today's examination.  Pancreatic parenchyma enhances normally, there is no peripancreatic fluid collection to suggest developing pseudocyst at this time. 2.  Cholelithiasis without findings to suggest acute cholecystitis. Additionally, no ductal stones are noted within the distal common bile duct and there is no intrahepatic biliary ductal dilatation. 3.  Stigmata of early cirrhosis redemonstrated.  Associated with this there is moderate splenomegaly. 4.  Trace volume of ascites. 5.  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 6.  6 mm nodule incompletely visualized in the medial segment of the right middle lobe. If the patient is at high risk for bronchogenic carcinoma, follow-up chest CT at 6-12 months is recommended.  If the patient is at low risk for bronchogenic carcinoma, follow-up chest CT at 12 months is recommended.  This recommendation follows the consensus statement: Guidelines for Management of Small Pulmonary Nodules Detected on CT Scans: A Statement from the Fleischner Society as published in Radiology 2005; 237:395-400. 7.  Additional incidental findings, as above.   Original Report  Authenticated By: Trudie Reed, M.D.   US Venous Img Lower Unilateral Left  04/14/2013   *RADIOLOGY REPORT*  Clinical Data: Left lower extremity edema and history of diabetes.  LEFT LOWER EXTREMITY VENOUS DUPLEX ULTRASOUND  Technique:  Gray-scale sonography with graded compression, as well as color Doppler and duplex ultrasound were performed to evaluate the deep venous system of the lower extremity from the level of the common femoral vein through the popliteal and proximal calf veins. Spectral Doppler was utilized to evaluate flow at rest and with distal augmentation maneuvers.  Comparison:  None.  Findings:  Normal compressibility of the common femoral, superficial femoral, and popliteal veins is demonstrated, as well as the visualized proximal calf veins.  No filling defects to suggest DVT on grayscale or color Doppler imaging.  Doppler waveforms show normal direction of venous flow, normal respiratory phasicity and response to augmentation.  There is no evidence of superficial thrombophlebitis or abnormal fluid collection.  Subcutaneous edema is present in the left calf and ankle regions.  IMPRESSION: No evidence of left lower extremity deep vein thrombosis.   Original Report Authenticated By: Irish Lack, M.D.     Assessment: 71  y/o female with h/o NASH cirrhosis, presenting with acute onset pancreatitis with lipase 507, CT showing very subtle haziness in perpancreatic fat. +gallstones without ductal dilation or acute cholecystitis. Overall liver profile has remained stable for her. Triglycerides were normal. Possible gallstone pancreatitis but EUS as outpatient should be done to rule out occult tumor.    Plan: 1. EUS as outpatient in 4-6 weeks.  2. Consider discharge today from GI standpoint.  3. Advised patient to titrate lactulose to 3-4 good BMs daily.    LOS: 2 days   Tana Coast  04/28/2013, 8:46 AM

## 2013-04-28 NOTE — Telephone Encounter (Signed)
Forwarding to Chales Abrahams for EUS referral with  Dr. Christella Hartigan

## 2013-04-28 NOTE — Clinical Social Work Note (Signed)
Patient ready for discharge today, will return to [facility].  Husband transporting in car.  FL2 reviewed w RN and updated as necessary.  Discharge summary faxed to facility via [facility].  Facility and patient agreeable to patient transfer to [facility] to complete rehab.  CSW signing off as no further SW needs have been identified.  Santa Genera, LCSW Clinical Social Worker [phone])

## 2013-04-29 ENCOUNTER — Other Ambulatory Visit: Payer: Self-pay

## 2013-04-29 ENCOUNTER — Encounter: Payer: Self-pay | Admitting: Gastroenterology

## 2013-04-29 DIAGNOSIS — K859 Acute pancreatitis without necrosis or infection, unspecified: Secondary | ICD-10-CM

## 2013-04-29 NOTE — Telephone Encounter (Signed)
Left message on machine to call back  

## 2013-04-29 NOTE — Telephone Encounter (Signed)
Pt husband has been notified, the pt is currently in a nursing home but will be home next week.  I have mailed a copy of all the instructions and the pt or her husband will call with any questions or concerns.  I reviewed her meds and allergies

## 2013-05-18 NOTE — Progress Notes (Signed)
Spoke with dr Council Mechanic aware 04-26-13 ekg left fascicular block new, ok to use ekg for 06-11-13 procedure, made dr Council Mechanic aware of platlet count 95 on 04-28-13, pt does not need repeat platlet count for 06-11-13 upper endo ultrasound procedure.dr Council Mechanic aware pt medical history.

## 2013-05-21 ENCOUNTER — Other Ambulatory Visit (HOSPITAL_COMMUNITY): Payer: Self-pay | Admitting: Orthopedic Surgery

## 2013-05-21 DIAGNOSIS — S42301A Unspecified fracture of shaft of humerus, right arm, initial encounter for closed fracture: Secondary | ICD-10-CM

## 2013-05-26 ENCOUNTER — Ambulatory Visit (HOSPITAL_COMMUNITY)
Admission: RE | Admit: 2013-05-26 | Discharge: 2013-05-26 | Disposition: A | Discharge status: Home / Self Care | Payer: Medicare Other | Source: Ambulatory Visit | Attending: Orthopedic Surgery | Admitting: Orthopedic Surgery

## 2013-05-26 DIAGNOSIS — S42301A Unspecified fracture of shaft of humerus, right arm, initial encounter for closed fracture: Secondary | ICD-10-CM

## 2013-05-26 DIAGNOSIS — Z4789 Encounter for other orthopedic aftercare: Secondary | ICD-10-CM | POA: Insufficient documentation

## 2013-06-01 ENCOUNTER — Telehealth: Payer: Self-pay | Admitting: Internal Medicine

## 2013-06-01 ENCOUNTER — Telehealth: Payer: Self-pay | Admitting: Gastroenterology

## 2013-06-01 NOTE — Telephone Encounter (Signed)
Noted  

## 2013-06-01 NOTE — Telephone Encounter (Signed)
Pt called this morning to let us know that she has a procedure scheduled at [facility] and she needed to cancel it.

## 2013-06-01 NOTE — Telephone Encounter (Signed)
FYI Dr Christella Hartigan procedure has been cx the pt says she is fine and doesn't want the procedure.  Dr Jena Gauss is aware

## 2013-06-11 ENCOUNTER — Ambulatory Visit (HOSPITAL_COMMUNITY): Admission: RE | Admit: 2013-06-11 | Payer: Medicare Other | Source: Ambulatory Visit | Admitting: Gastroenterology

## 2013-06-11 ENCOUNTER — Encounter (HOSPITAL_COMMUNITY): Admission: RE | Payer: Self-pay | Source: Ambulatory Visit

## 2013-06-11 SURGERY — UPPER ENDOSCOPIC ULTRASOUND (EUS) LINEAR
Anesthesia: Monitor Anesthesia Care

## 2013-07-27 ENCOUNTER — Other Ambulatory Visit (HOSPITAL_COMMUNITY): Payer: Self-pay | Admitting: Orthopedic Surgery

## 2013-07-27 DIAGNOSIS — IMO0002 Reserved for concepts with insufficient information to code with codable children: Secondary | ICD-10-CM

## 2013-07-29 ENCOUNTER — Encounter (HOSPITAL_COMMUNITY): Payer: Self-pay

## 2013-07-29 ENCOUNTER — Emergency Department (HOSPITAL_COMMUNITY): Payer: Medicare Other

## 2013-07-29 ENCOUNTER — Inpatient Hospital Stay (HOSPITAL_COMMUNITY)
Admission: EM | Admit: 2013-07-29 | Discharge: 2013-07-31 | DRG: 689 | Disposition: A | Discharge status: Home Health | Payer: Medicare Other | Attending: Internal Medicine | Admitting: Internal Medicine

## 2013-07-29 DIAGNOSIS — G608 Other hereditary and idiopathic neuropathies: Secondary | ICD-10-CM | POA: Diagnosis present

## 2013-07-29 DIAGNOSIS — R29898 Other symptoms and signs involving the musculoskeletal system: Secondary | ICD-10-CM

## 2013-07-29 DIAGNOSIS — J45909 Unspecified asthma, uncomplicated: Secondary | ICD-10-CM | POA: Diagnosis present

## 2013-07-29 DIAGNOSIS — R410 Disorientation, unspecified: Secondary | ICD-10-CM

## 2013-07-29 DIAGNOSIS — Z8249 Family history of ischemic heart disease and other diseases of the circulatory system: Secondary | ICD-10-CM

## 2013-07-29 DIAGNOSIS — I10 Essential (primary) hypertension: Secondary | ICD-10-CM | POA: Diagnosis present

## 2013-07-29 DIAGNOSIS — Z6835 Body mass index (BMI) 35.0-35.9, adult: Secondary | ICD-10-CM

## 2013-07-29 DIAGNOSIS — K7290 Hepatic failure, unspecified without coma: Secondary | ICD-10-CM | POA: Diagnosis present

## 2013-07-29 DIAGNOSIS — F29 Unspecified psychosis not due to a substance or known physiological condition: Secondary | ICD-10-CM

## 2013-07-29 DIAGNOSIS — Z90710 Acquired absence of both cervix and uterus: Secondary | ICD-10-CM

## 2013-07-29 DIAGNOSIS — R531 Weakness: Secondary | ICD-10-CM

## 2013-07-29 DIAGNOSIS — Z903 Acquired absence of stomach [part of]: Secondary | ICD-10-CM

## 2013-07-29 DIAGNOSIS — G9340 Encephalopathy, unspecified: Secondary | ICD-10-CM

## 2013-07-29 DIAGNOSIS — I2510 Atherosclerotic heart disease of native coronary artery without angina pectoris: Secondary | ICD-10-CM | POA: Diagnosis present

## 2013-07-29 DIAGNOSIS — K219 Gastro-esophageal reflux disease without esophagitis: Secondary | ICD-10-CM | POA: Diagnosis present

## 2013-07-29 DIAGNOSIS — K7460 Unspecified cirrhosis of liver: Secondary | ICD-10-CM

## 2013-07-29 DIAGNOSIS — K7682 Hepatic encephalopathy: Secondary | ICD-10-CM | POA: Diagnosis present

## 2013-07-29 DIAGNOSIS — E119 Type 2 diabetes mellitus without complications: Secondary | ICD-10-CM | POA: Diagnosis present

## 2013-07-29 DIAGNOSIS — N390 Urinary tract infection, site not specified: Principal | ICD-10-CM | POA: Diagnosis present

## 2013-07-29 DIAGNOSIS — Z79899 Other long term (current) drug therapy: Secondary | ICD-10-CM

## 2013-07-29 DIAGNOSIS — K209 Esophagitis, unspecified without bleeding: Secondary | ICD-10-CM | POA: Diagnosis present

## 2013-07-29 DIAGNOSIS — K7689 Other specified diseases of liver: Secondary | ICD-10-CM | POA: Diagnosis present

## 2013-07-29 DIAGNOSIS — M25519 Pain in unspecified shoulder: Secondary | ICD-10-CM | POA: Diagnosis present

## 2013-07-29 DIAGNOSIS — G8929 Other chronic pain: Secondary | ICD-10-CM | POA: Diagnosis present

## 2013-07-29 DIAGNOSIS — Z87440 Personal history of urinary (tract) infections: Secondary | ICD-10-CM

## 2013-07-29 DIAGNOSIS — E785 Hyperlipidemia, unspecified: Secondary | ICD-10-CM | POA: Diagnosis present

## 2013-07-29 DIAGNOSIS — E669 Obesity, unspecified: Secondary | ICD-10-CM

## 2013-07-29 DIAGNOSIS — Z833 Family history of diabetes mellitus: Secondary | ICD-10-CM

## 2013-07-29 DIAGNOSIS — Z794 Long term (current) use of insulin: Secondary | ICD-10-CM

## 2013-07-29 DIAGNOSIS — D696 Thrombocytopenia, unspecified: Secondary | ICD-10-CM

## 2013-07-29 HISTORY — DX: Acute pancreatitis without necrosis or infection, unspecified: K85.90

## 2013-07-29 LAB — URINALYSIS W MICROSCOPIC + REFLEX CULTURE
Ketones, ur: NEGATIVE mg/dL
Leukocytes, UA: NEGATIVE
Nitrite: POSITIVE — AB
Specific Gravity, Urine: 1.025 (ref 1.005–1.030)
Urobilinogen, UA: 0.2 mg/dL (ref 0.0–1.0)
pH: 6 (ref 5.0–8.0)

## 2013-07-29 LAB — CBC WITH DIFFERENTIAL/PLATELET
Basophils Absolute: 0 10*3/uL (ref 0.0–0.1)
Basophils Relative: 1 % (ref 0–1)
Eosinophils Absolute: 0.1 10*3/uL (ref 0.0–0.7)
HCT: 37.5 % (ref 36.0–46.0)
Hemoglobin: 13.2 g/dL (ref 12.0–15.0)
MCH: 29.4 pg (ref 26.0–34.0)
MCHC: 35.2 g/dL (ref 30.0–36.0)
Monocytes Absolute: 0.4 10*3/uL (ref 0.1–1.0)
Monocytes Relative: 8 % (ref 3–12)
Neutro Abs: 3.3 10*3/uL (ref 1.7–7.7)
RDW: 15 % (ref 11.5–15.5)

## 2013-07-29 LAB — COMPREHENSIVE METABOLIC PANEL
AST: 17 U/L (ref 0–37)
Albumin: 3.1 g/dL — ABNORMAL LOW (ref 3.5–5.2)
BUN: 12 mg/dL (ref 6–23)
Calcium: 9.3 mg/dL (ref 8.4–10.5)
Chloride: 107 mEq/L (ref 96–112)
Creatinine, Ser: 0.62 mg/dL (ref 0.50–1.10)
Total Protein: 5.8 g/dL — ABNORMAL LOW (ref 6.0–8.3)

## 2013-07-29 LAB — LACTIC ACID, PLASMA: Lactic Acid, Venous: 2.3 mmol/L — ABNORMAL HIGH (ref 0.5–2.2)

## 2013-07-29 LAB — GLUCOSE, CAPILLARY

## 2013-07-29 LAB — TROPONIN I: Troponin I: <0.3 ng/mL (ref <0.30)

## 2013-07-29 LAB — LIPASE, BLOOD: Lipase: 37 U/L (ref 11–59)

## 2013-07-29 LAB — AMMONIA: Ammonia: 77 umol/L — ABNORMAL HIGH (ref 11–60)

## 2013-07-29 MED ORDER — FUROSEMIDE 20 MG PO TABS
20.0000 mg | ORAL_TABLET | Freq: Every day | ORAL | Status: DC
Start: 2013-07-29 — End: 2013-07-31
  Administered 2013-07-30 – 2013-07-31 (×2): 20 mg via ORAL
  Filled 2013-07-29 (×2): qty 1

## 2013-07-29 MED ORDER — ASPIRIN EC 81 MG PO TBEC
81.0000 mg | DELAYED_RELEASE_TABLET | Freq: Every day | ORAL | Status: DC
  Administered 2013-07-29 – 2013-07-31 (×3): 81 mg via ORAL
  Filled 2013-07-29 (×6): qty 1

## 2013-07-29 MED ORDER — LACTULOSE 10 GM/15ML PO SOLN
40.0000 g | Freq: Three times a day (TID) | ORAL | Status: DC
  Administered 2013-07-29 – 2013-07-31 (×6): 40 g via ORAL
  Filled 2013-07-29 (×6): qty 60

## 2013-07-29 MED ORDER — PANTOPRAZOLE SODIUM 40 MG PO TBEC
40.0000 mg | DELAYED_RELEASE_TABLET | Freq: Every day | ORAL | Status: DC
  Administered 2013-07-29 – 2013-07-31 (×3): 40 mg via ORAL
  Filled 2013-07-29 (×3): qty 1

## 2013-07-29 MED ORDER — INSULIN ASPART 100 UNIT/ML ~~LOC~~ SOLN
0.0000 [IU] | Freq: Every day | SUBCUTANEOUS | Status: DC
Start: 2013-07-29 — End: 2013-07-31
  Administered 2013-07-29: 4 [IU] via SUBCUTANEOUS
  Administered 2013-07-30: 3 [IU] via SUBCUTANEOUS

## 2013-07-29 MED ORDER — INSULIN ASPART 100 UNIT/ML ~~LOC~~ SOLN
0.0000 [IU] | Freq: Three times a day (TID) | SUBCUTANEOUS | Status: DC
  Administered 2013-07-30: 7 [IU] via SUBCUTANEOUS
  Administered 2013-07-30: 15 [IU] via SUBCUTANEOUS
  Administered 2013-07-30: 4 [IU] via SUBCUTANEOUS
  Administered 2013-07-31: 7 [IU] via SUBCUTANEOUS
  Administered 2013-07-31 (×2): 11 [IU] via SUBCUTANEOUS

## 2013-07-29 MED ORDER — SODIUM CHLORIDE 0.9 % IV SOLN
INTRAVENOUS | Status: DC
  Administered 2013-07-29 (×2): via INTRAVENOUS

## 2013-07-29 MED ORDER — DEXTROSE 5 % IV SOLN: 1.0000 g | INTRAVENOUS | Status: DC

## 2013-07-29 MED ORDER — HEPARIN SODIUM (PORCINE) 5000 UNIT/ML IJ SOLN
5000.0000 [IU] | Freq: Three times a day (TID) | INTRAMUSCULAR | Status: DC
  Administered 2013-07-29 – 2013-07-31 (×6): 5000 [IU] via SUBCUTANEOUS
  Filled 2013-07-29 (×6): qty 1

## 2013-07-29 MED ORDER — INSULIN DETEMIR 100 UNIT/ML ~~LOC~~ SOLN
SUBCUTANEOUS | Status: AC
  Filled 2013-07-29: qty 1

## 2013-07-29 MED ORDER — ALPRAZOLAM 0.5 MG PO TABS
0.5000 mg | ORAL_TABLET | Freq: Three times a day (TID) | ORAL | Status: DC | PRN
  Administered 2013-07-29 – 2013-07-30 (×3): 0.5 mg via ORAL
  Filled 2013-07-29 (×4): qty 1

## 2013-07-29 MED ORDER — ONDANSETRON HCL 4 MG PO TABS: 4.0000 mg | ORAL_TABLET | Freq: Four times a day (QID) | ORAL | Status: DC | PRN

## 2013-07-29 MED ORDER — ENALAPRIL MALEATE 5 MG PO TABS
5.0000 mg | ORAL_TABLET | Freq: Every day | ORAL | Status: DC
  Administered 2013-07-29 – 2013-07-31 (×3): 5 mg via ORAL
  Filled 2013-07-29 (×3): qty 1

## 2013-07-29 MED ORDER — SODIUM CHLORIDE 0.9 % IV SOLN: INTRAVENOUS | Status: DC

## 2013-07-29 MED ORDER — LISINOPRIL 5 MG PO TABS
5.0000 mg | ORAL_TABLET | Freq: Every day | ORAL | Status: DC
  Administered 2013-07-29 – 2013-07-31 (×3): 5 mg via ORAL
  Filled 2013-07-29 (×3): qty 1

## 2013-07-29 MED ORDER — BUDESONIDE-FORMOTEROL FUMARATE 160-4.5 MCG/ACT IN AERO
INHALATION_SPRAY | RESPIRATORY_TRACT | Status: AC
  Filled 2013-07-29: qty 6

## 2013-07-29 MED ORDER — DEXTROSE 5 % IV SOLN
1.0000 g | Freq: Once | INTRAVENOUS | Status: AC
  Administered 2013-07-29: 1 g via INTRAVENOUS
  Filled 2013-07-29: qty 10

## 2013-07-29 MED ORDER — OXYCODONE HCL 5 MG PO TABS
5.0000 mg | ORAL_TABLET | ORAL | Status: DC | PRN
  Administered 2013-07-29 – 2013-07-31 (×4): 5 mg via ORAL
  Filled 2013-07-29 (×5): qty 1

## 2013-07-29 MED ORDER — TRIAMCINOLONE ACETONIDE 0.1 % EX CREA
1.0000 "application " | TOPICAL_CREAM | Freq: Every day | CUTANEOUS | Status: DC | PRN
  Administered 2013-07-30: 1 via TOPICAL
  Filled 2013-07-29: qty 15

## 2013-07-29 MED ORDER — BUDESONIDE-FORMOTEROL FUMARATE 160-4.5 MCG/ACT IN AERO
2.0000 | INHALATION_SPRAY | Freq: Two times a day (BID) | RESPIRATORY_TRACT | Status: DC
  Administered 2013-07-29 – 2013-07-31 (×4): 2 via RESPIRATORY_TRACT
  Filled 2013-07-29: qty 6

## 2013-07-29 MED ORDER — ONDANSETRON HCL 4 MG/2ML IJ SOLN: 4.0000 mg | Freq: Four times a day (QID) | INTRAMUSCULAR | Status: DC | PRN

## 2013-07-29 MED ORDER — DEXTROSE 5 % IV SOLN
1.0000 g | INTRAVENOUS | Status: DC
  Administered 2013-07-30: 1 g via INTRAVENOUS
  Filled 2013-07-29 (×4): qty 10

## 2013-07-29 MED ORDER — MECLIZINE HCL 12.5 MG PO TABS
25.0000 mg | ORAL_TABLET | Freq: Three times a day (TID) | ORAL | Status: DC | PRN
  Administered 2013-07-31: 25 mg via ORAL
  Filled 2013-07-29: qty 2

## 2013-07-29 MED ORDER — TICAGRELOR 90 MG PO TABS
ORAL_TABLET | ORAL | Status: AC
  Filled 2013-07-29: qty 1

## 2013-07-29 MED ORDER — MENTHOL (TOPICAL ANALGESIC) 4 % EX GEL
1.0000 "application " | Freq: Every day | CUTANEOUS | Status: DC | PRN
  Filled 2013-07-29: qty 1

## 2013-07-29 MED ORDER — PREGABALIN 50 MG PO CAPS
100.0000 mg | ORAL_CAPSULE | Freq: Every day | ORAL | Status: DC
  Administered 2013-07-29 – 2013-07-31 (×3): 100 mg via ORAL
  Filled 2013-07-29 (×3): qty 2

## 2013-07-29 MED ORDER — INSULIN DETEMIR 100 UNIT/ML ~~LOC~~ SOLN
60.0000 [IU] | Freq: Every day | SUBCUTANEOUS | Status: DC
  Administered 2013-07-29 – 2013-07-30 (×2): 60 [IU] via SUBCUTANEOUS
  Filled 2013-07-29 (×5): qty 0.6

## 2013-07-29 NOTE — ED Provider Notes (Signed)
CSN: [id number]     Arrival date & time 07/29/13  1447 History   First MD Initiated Contact with Patient 07/29/13 1512     Chief Complaint  Patient presents with  . Fall    arm right arm pain  . Urinary Tract Infection  . Altered Mental Status    Patient is a 72 y.o. female presenting with fall, urinary tract infection, and altered mental status. The history is provided by the patient, a relative and a caregiver. The history is limited by the condition of the patient (confusion).  Fall  Urinary Tract Infection  Altered Mental Status Pt was seen at 1515. Per pt and family, c/o gradual onset and worsening of persistent confusion and generalized weakness for the past 3 days. Pt's family states she can usually stand on her own, but has been unable since Sunday (3 days ago). Family states pt often becomes confused when "she has a UTI or her ammonia level is elevated." Endorses she fell last night due to inability to stand due to generalized weakness. Denies fevers, no rash, no N/V, no increasing diarrhea from baseline (takes lactulose), no CP/SOB, no cough, no syncope/LOC.    Past Medical History  Diagnosis Date  . CAD in native artery   . Thrombocytopenia, unspecified   . Unspecified hereditary and idiopathic peripheral neuropathy   . Unspecified fall   . Other and unspecified hyperlipidemia   . Unspecified essential hypertension   . Obesity, unspecified   . Shortness of breath   . Personal history of neurosis   . Intrinsic asthma, unspecified   . Allergic rhinitis, cause unspecified   . Cirrhosis of liver     NASH, afp on 06/17/12 =3.7, per pt she had hep B vaccines in 1996  . Colon adenomas 03/08/10    tcs by Dr. Jena Gauss  . Hyperplastic polyps of stomach 03/08/10    tcs by Dr. Geni Bers  . Chronic gastritis 03/09/11    egd by Dr. Jena Gauss  . History of hemorrhoids 03/08/10    tcs- internal and external  . Diverticula of colon 03/08/10    L side  . Hiatal hernia 03/08/10  . Esophagitis,  erosive 03/08/10  . GERD (gastroesophageal reflux disease)   . Wears glasses   . Type II or unspecified type diabetes mellitus without mention of complication, not stated as uncontrolled   . Vertigo    Past Surgical History  Procedure Laterality Date  . Hysterectomy and btl      s/p  . Abdominal hysterectomy    . Tumor excision  2003    rt arm and left foot  . Colonoscopy  03/08/10    Dr. [name]-->ext/int hemorrhoids, anal paipilla, rectal polyps, desc polyps, cecal polyp, left-sided diverticula. suboptiomal prep. next TCS 02/2015. multiple adenomas  . Esophagogastroduodenoscopy  03/08/10    Dr. Dellie Catholic erosive RE, small hh, antral erosions, small 1cm are of mucosal indentation along gastric body of doubtful significance, cystic nodularity of hypophyarynx, base of tongue, base of epiglottis. benign gastric biopsies  . Tubal ligation    . Esophagogastroduodenoscopy  10/08/2011    Dr. Dionisio David esophageal varices, antral erosion. next egd 09/2013  . Foot surgery  2003    lt foot  . Breast surgery    . Eye surgery      cataracts  . Orif humerus fracture Right 02/17/2013    Procedure: OPEN REDUCTION INTERNAL FIXATION (ORIF) RIGHT PROXIMAL HUMERUS FRACTURE;  Surgeon: Budd Palmer, MD;  Location: [facility] OR;  Service:  Orthopedics;  Laterality: Right;  . Partial gastrectomy    . Cardiac catheterization  02/25/2007    Dr. Clarene Duke:  normal LV systolic function, mild irregularities of LAD   Family History  Problem Relation Age of Onset  . Coronary artery disease      FH  . Diabetes      FH  . Heart disease Mother   . Colon cancer Neg Hx    History  Substance Use Topics  . Smoking status: Never Smoker   . Smokeless tobacco: Not on file  . Alcohol Use: No    Review of Systems  Unable to perform ROS: Mental status change    Allergies  Morphine and related; Prochlorperazine edisylate; and Compazine  Home Medications   Current Outpatient Rx  Name  Route  Sig  Dispense  Refill  .  ALPRAZolam (XANAX) 0.5 MG tablet   Oral   Take 0.5 mg by mouth 3 (three) times daily as needed for anxiety.         Marland Kitchen aspirin (BAYER LOW STRENGTH) 81 MG EC tablet   Oral   Take 81 mg by mouth daily.           . budesonide-formoterol (SYMBICORT) 160-4.5 MCG/ACT inhaler   Inhalation   Inhale 2 puffs into the lungs 2 (two) times daily.           . diphenhydrAMINE (BENADRYL) 25 mg capsule   Oral   Take 25 mg by mouth every 4 (four) hours as needed for itching.         . docusate sodium (COLACE) 100 MG capsule   Oral   Take 100 mg by mouth daily.         . enalapril (VASOTEC) 5 MG tablet   Oral   Take 5 mg by mouth daily.           . furosemide (LASIX) 20 MG tablet   Oral   Take 20 mg by mouth daily. Started on 04/12/2013 for 7 days         . GENERLAC 10 GM/15ML SOLN               . insulin aspart (NOVOLOG) 100 UNIT/ML injection   Subcutaneous   Inject 10-40 Units into the skin daily as needed. Per sliding scale. Patient injects 10-40 units for levels above 150 to 300         . insulin detemir (LEVEMIR) 100 UNIT/ML injection   Subcutaneous   Inject 80 Units into the skin at bedtime.         Marland Kitchen lactulose (CHRONULAC) 10 GM/15ML solution   Oral   Take 60 mLs (40 g total) by mouth 3 (three) times daily.   5400 mL   3     1 month supply   . linagliptin (TRADJENTA) 5 MG TABS tablet   Oral   Take 5 mg by mouth daily.         . meclizine (ANTIVERT) 25 MG tablet   Oral   Take 25 mg by mouth 3 (three) times daily as needed for dizziness or nausea.          . Multiple Vitamins-Minerals (CENTRUM SILVER PO)   Oral   Take 1 tablet by mouth daily.          Marland Kitchen omeprazole (PRILOSEC) 40 MG capsule   Oral   Take 40 mg by mouth daily.         Marland Kitchen oxyCODONE (OXYCONTIN) 10 MG 12  hr tablet   Oral   Take 10 mg by mouth every 4 (four) hours as needed for pain.         . pregabalin (LYRICA) 100 MG capsule   Oral   Take 100 mg by mouth 2 (two) times  daily.          . rifaximin (XIFAXAN) 550 MG TABS   Oral   Take 1 tablet (550 mg total) by mouth 2 (two) times daily.   60 tablet   3   . triamcinolone cream (KENALOG) 0.1 %                BP 118/59  Pulse 85  Temp(Src) 97.8 F (36.6 C) (Oral)  Resp 16  Ht 5\' 8"  (1.727 m)  Wt 230 lb (104.327 kg)  BMI 34.98 kg/m2  SpO2 100% Physical Exam 1520: Physical examination:  Nursing notes reviewed; Vital signs and O2 SAT reviewed;  Constitutional: Well developed, Well nourished, In no acute distress; Head:  Normocephalic, atraumatic; Eyes: EOMI, PERRL, No scleral icterus; ENMT: Mouth and pharynx normal, Mucous membranes dry; Neck: Supple, Full range of motion, No lymphadenopathy; Cardiovascular: Regular rate and rhythm, No gallop; Respiratory: Breath sounds clear & equal bilaterally, No rales, rhonchi, wheezes.  Speaking full sentences with ease, Normal respiratory effort/excursion; Chest: Nontender, Movement normal; Abdomen: Soft, Nontender, Nondistended, Normal bowel sounds; Genitourinary: No CVA tenderness; Extremities: Pulses normal, No tenderness, No edema, No calf edema or asymmetry.; Neuro: Awake, alert, mildy confused re: events. Major CN grossly intact. No facial droop. Speech clear. No gross focal motor or sensory deficits in extremities.; Skin: Color normal, Warm, Dry.   ED Course  Procedures     MDM  MDM Reviewed: previous chart, nursing note and vitals Reviewed previous: labs and ECG Interpretation: labs, ECG and x-ray    Date: 07/29/2013  Rate: 78  Rhythm: normal sinus rhythm  QRS Axis: left  Intervals: normal  ST/T Wave abnormalities: normal  Conduction Disutrbances:left anterior fascicular block  Narrative Interpretation:   Old EKG Reviewed: unchanged; no significant changes from previous EKG dated 04/26/2013.  Results for orders placed during the hospital encounter of 07/29/13  URINALYSIS W MICROSCOPIC + REFLEX CULTURE      Result Value Range   Color,  Urine YELLOW  YELLOW   APPearance CLEAR  CLEAR   Specific Gravity, Urine 1.025  1.005 - 1.030   pH 6.0  5.0 - 8.0   Glucose, UA 500 (*) NEGATIVE mg/dL   Hgb urine dipstick NEGATIVE  NEGATIVE   Bilirubin Urine NEGATIVE  NEGATIVE   Ketones, ur NEGATIVE  NEGATIVE mg/dL   Protein, ur NEGATIVE  NEGATIVE mg/dL   Urobilinogen, UA 0.2  0.0 - 1.0 mg/dL   Nitrite POSITIVE (*) NEGATIVE   Leukocytes, UA NEGATIVE  NEGATIVE   WBC, UA 3-6  <3 WBC/hpf   Bacteria, UA MANY (*) RARE   Squamous Epithelial / LPF RARE  RARE  CBC WITH DIFFERENTIAL      Result Value Range   WBC 5.2  4.0 - 10.5 K/uL   RBC 4.49  3.87 - 5.11 MIL/uL   Hemoglobin 13.2  12.0 - 15.0 g/dL   HCT 21.3  08.6 - 57.8 %   MCV 83.5  78.0 - 100.0 fL   MCH 29.4  26.0 - 34.0 pg   MCHC 35.2  30.0 - 36.0 g/dL   RDW 46.9  62.9 - 52.8 %   Platelets 123 (*) 150 - 400 K/uL   Neutrophils Relative %  63  43 - 77 %   Neutro Abs 3.3  1.7 - 7.7 K/uL   Lymphocytes Relative 26  12 - 46 %   Lymphs Abs 1.3  0.7 - 4.0 K/uL   Monocytes Relative 8  3 - 12 %   Monocytes Absolute 0.4  0.1 - 1.0 K/uL   Eosinophils Relative 2  0 - 5 %   Eosinophils Absolute 0.1  0.0 - 0.7 K/uL   Basophils Relative 1  0 - 1 %   Basophils Absolute 0.0  0.0 - 0.1 K/uL  COMPREHENSIVE METABOLIC PANEL      Result Value Range   Sodium 138  135 - 145 mEq/L   Potassium 3.5  3.5 - 5.1 mEq/L   Chloride 107  96 - 112 mEq/L   CO2 22  19 - 32 mEq/L   Glucose, Bld 290 (*) 70 - 99 mg/dL   BUN 12  6 - 23 mg/dL   Creatinine, Ser 7.82  0.50 - 1.10 mg/dL   Calcium 9.3  8.4 - 95.6 mg/dL   Total Protein 5.8 (*) 6.0 - 8.3 g/dL   Albumin 3.1 (*) 3.5 - 5.2 g/dL   AST 17  0 - 37 U/L   ALT 11  0 - 35 U/L   Alkaline Phosphatase 101  39 - 117 U/L   Total Bilirubin 1.8 (*) 0.3 - 1.2 mg/dL   GFR calc non Af Amer 89 (*) >90 mL/min   GFR calc Af Amer >90  >90 mL/min  LIPASE, BLOOD      Result Value Range   Lipase 37  11 - 59 U/L  AMMONIA      Result Value Range   Ammonia 77 (*) 11 - 60  umol/L  TROPONIN I      Result Value Range   Troponin I <0.30  <0.30 ng/mL  LACTIC ACID, PLASMA      Result Value Range   Lactic Acid, Venous 2.3 (*) 0.5 - 2.2 mmol/L   Dg Chest Port 1 View 07/29/2013   CLINICAL DATA:  Fall, altered mental status.  EXAM: PORTABLE CHEST - 1 VIEW  COMPARISON:  04/26/2013  FINDINGS: Cardiomegaly with vascular congestion. Diffuse interstitial prominence throughout the lungs could reflect early interstitial edema. No confluent opacities or effusions.  IMPRESSION: Cardiomegaly, question early interstitial edema.   Electronically Signed   By: Charlett Nose M.D.   On: 07/29/2013 16:12   Results for HAMDI, VARI (MRN [id number]) as of 07/29/2013 18:16  Ref. Range 02/16/2013 06:27 04/16/2013 08:25 04/26/2013 15:38 04/27/2013 03:18 04/28/2013 06:00 07/29/2013 15:27  Total Bilirubin Latest Range: 0.3-1.2 mg/dL 2.1 (H) 1.6 (H) 1.5 (H) 1.3 (H) 1.5 (H) 1.8 (H)   Results for LAURAN, ROMANSKI (MRN [id number]) as of 07/29/2013 18:16  Ref. Range 02/18/2013 06:00 03/20/2013 23:22 04/16/2013 08:25 04/26/2013 14:42 04/28/2013 06:00 07/29/2013 15:27  Platelets Latest Range: 150-400 K/uL 146 (L) 126 (L) 142 (L) 122 (L) 95 (L) 123 (L)   Results for KATHA, KUEHNE (MRN [id number]) as of 07/29/2013 18:16  Ref. Range 04/08/2012 09:48 08/07/2012 22:40 12/04/2012 15:55 02/14/2013 15:05 04/16/2013 08:25 04/26/2013 15:39 07/29/2013 15:28  Ammonia Latest Range: 11-60 umol/L 82 (H) 58 54 45 90 (H) 59 77 (H)     1800:  Pt unsteady when sitting for orthostatic VS; unable to stand without very heavy assist x2. Family states this is not pt's norm.  Ammonia level mildly elevated, but consistent with previous levels. +UTI, UC pending; will dose IV rocephin. Dx  and testing d/w pt and family.  Questions answered.  Verb understanding, agreeable to admit.  T/C to [facility] Dr. Irene Limbo, case discussed, including:  HPI, pertinent PM/SHx, VS/PE, dx testing, ED course and treatment:  Agreeable to observation admit, requests to write  temporary orders, obtain medical bed to team 1.      Laray Anger, DO 07/30/13 0002

## 2013-07-29 NOTE — ED Notes (Signed)
Pt's husband/family report that the pt has been sick for years, since Sunday she has been more confused.  The pt reports painful urination for months, has been under md treatment and not gotten better. No fever. She fell last night d/t dizziness (which is not unusual), and is having right arm pain.  She thinks he ammonia level may be high.

## 2013-07-29 NOTE — H&P (Addendum)
[facility] History and Physical  [name] [name]:[id number] DOB: 08-20-1941 DOA: 07/29/2013  Referring physician: ER. PCP: Colette Ribas, MD  Specialists: Dr. Kendell Bane, gastroenterology.  Chief Complaint: Altered mental status/confusion.  HPI: [name] [name] is a 72 y.o. female who presents with a four-day history of confusion. She apparently has a history of this and on previous occasions this has been related to either UTI or hepatic encephalopathy secondary to her cirrhosis. She denies a fever. She denies any urinary symptoms which are new such as dysuria or increased frequency of micturition. She says that she has increased frequency on a more chronic basis. She denies any abdominal pain, nausea or vomiting. She was admitted recently in mid-June with acute pancreatitis, likely related to gallstones. She actually feels better at the present time, less confused.  Review of Systems Apart from history of present illness, other systems negative.  Past Medical History  Diagnosis Date  . CAD in native artery   . Thrombocytopenia, unspecified   . Unspecified hereditary and idiopathic peripheral neuropathy   . Unspecified fall   . Other and unspecified hyperlipidemia   . Unspecified essential hypertension   . Obesity, unspecified   . Shortness of breath   . Personal history of neurosis   . Intrinsic asthma, unspecified   . Allergic rhinitis, cause unspecified   . Cirrhosis of liver     NASH, afp on 06/17/12 =3.7, per pt she had hep B vaccines in 1996  . Colon adenomas 03/08/10    tcs by Dr. Jena Gauss  . Hyperplastic polyps of stomach 03/08/10    tcs by Dr. Geni Bers  . Chronic gastritis 03/09/11    egd by Dr. Jena Gauss  . History of hemorrhoids 03/08/10    tcs- internal and external  . Diverticula of colon 03/08/10    L side  . Hiatal hernia 03/08/10  . Esophagitis, erosive 03/08/10  . GERD (gastroesophageal reflux disease)   . Wears glasses   . Type II or unspecified type diabetes  mellitus without mention of complication, not stated as uncontrolled   . Vertigo   . Pancreatitis    Past Surgical History  Procedure Laterality Date  . Hysterectomy and btl      s/p  . Abdominal hysterectomy    . Tumor excision  2003    rt arm and left foot  . Colonoscopy  03/08/10    Dr. [name]-->ext/int hemorrhoids, anal paipilla, rectal polyps, desc polyps, cecal polyp, left-sided diverticula. suboptiomal prep. next TCS 02/2015. multiple adenomas  . Esophagogastroduodenoscopy  03/08/10    Dr. Dellie Catholic erosive RE, small hh, antral erosions, small 1cm are of mucosal indentation along gastric body of doubtful significance, cystic nodularity of hypophyarynx, base of tongue, base of epiglottis. benign gastric biopsies  . Tubal ligation    . Esophagogastroduodenoscopy  10/08/2011    Dr. Dionisio David esophageal varices, antral erosion. next egd 09/2013  . Foot surgery  2003    lt foot  . Breast surgery    . Eye surgery      cataracts  . Orif humerus fracture Right 02/17/2013    Procedure: OPEN REDUCTION INTERNAL FIXATION (ORIF) RIGHT PROXIMAL HUMERUS FRACTURE;  Surgeon: Budd Palmer, MD;  Location: [facility] OR;  Service: Orthopedics;  Laterality: Right;  . Partial gastrectomy    . Cardiac catheterization  02/25/2007    Dr. Clarene Duke:  normal LV systolic function, mild irregularities of LAD   Social History:  reports that she has never smoked. She does not have  any smokeless tobacco history on file. She reports that she does not drink alcohol or use illicit drugs. She is married, lives with her husband.   Allergies  Allergen Reactions  . Morphine And Related Itching  . Prochlorperazine Edisylate Other (See Comments)    Causes anxiety/numbness  . Compazine [Prochlorperazine Edisylate] Anxiety    Causes anxiety & nervousness    Family History  Problem Relation Age of Onset  . Coronary artery disease      FH  . Diabetes      FH  . Heart disease Mother   . Colon cancer Neg Hx        Prior to Admission medications   Medication Sig Start Date End Date Taking? Authorizing Provider  ALPRAZolam Prudy Feeler) 0.5 MG tablet Take 0.5 mg by mouth 3 (three) times daily as needed for anxiety. 08/09/12  Yes Erick Blinks, MD  aspirin (BAYER LOW STRENGTH) 81 MG EC tablet Take 81 mg by mouth daily.     Yes Historical Provider, MD  budesonide-formoterol (SYMBICORT) 160-4.5 MCG/ACT inhaler Inhale 2 puffs into the lungs 2 (two) times daily.     Yes Historical Provider, MD  enalapril (VASOTEC) 5 MG tablet Take 5 mg by mouth daily.     Yes Historical Provider, MD  esomeprazole (NEXIUM) 40 MG capsule Take 40 mg by mouth every morning.   Yes Historical Provider, MD  insulin aspart (NOVOLOG) 100 UNIT/ML injection Inject 10-14 Units into the skin daily as needed (90-150= 10 units 151-200= 11 units 201-250= 12 units 250-300= 13 units 301-350= 14 units as directed per sliding scale instructions). Per sliding scale. Patient injects 10-40 units for levels above 150 to 300   Yes Historical Provider, MD  insulin detemir (LEVEMIR) 100 UNIT/ML injection Inject 60 Units into the skin at bedtime.    Yes Historical Provider, MD  lactulose (CHRONULAC) 10 GM/15ML solution Take 60 mLs (40 g total) by mouth 3 (three) times daily. 04/13/13  Yes Nira Retort, NP  Menthol, Topical Analgesic, (BIOFREEZE) 4 % GEL Apply 1 application topically daily as needed (for arm pain).   Yes Historical Provider, MD  Multiple Vitamins-Minerals (CENTRUM SILVER PO) Take 1 tablet by mouth daily.    Yes Historical Provider, MD  oxyCODONE (OXYCONTIN) 10 MG 12 hr tablet Take 10 mg by mouth every 4 (four) hours as needed for pain.   Yes Historical Provider, MD  pregabalin (LYRICA) 100 MG capsule Take 100-200 mg by mouth daily.    Yes Historical Provider, MD  quinapril (ACCUPRIL) 5 MG tablet Take 5 mg by mouth daily.   Yes Historical Provider, MD  furosemide (LASIX) 20 MG tablet Take 20 mg by mouth daily. Started on 04/12/2013 for 7 days     Historical Provider, MD  meclizine (ANTIVERT) 25 MG tablet Take 25 mg by mouth 3 (three) times daily as needed for dizziness or nausea.     Historical Provider, MD  triamcinolone cream (KENALOG) 0.1 % Apply 1 application topically daily as needed (for irritation).  06/26/13   Historical Provider, MD   Physical Exam: Filed Vitals:   07/29/13 1923  BP: 133/65  Pulse: 78  Temp: 97.4 F (36.3 C)  Resp: 16     General:  She looks systemically well. She does not look toxic or septic. She appears to be alert at the present time.  Eyes: No jaundice. No pallor.  ENT: No abnormalities.  Neck: No lymphadenopathy.  Cardiovascular: Heart sounds are present in sinus rhythm. No murmurs. No  evidence of heart failure.  Respiratory: Lung fields are clear.  Abdomen: Soft, nontender.  Skin: No rash.  Musculoskeletal: No acute joint abnormalities.  Psychiatric: Appropriate affect.  Neurologic: Alert and orientated without any focal neurological signs.  Labs on Admission:  Basic Metabolic Panel:  Recent Labs Lab 07/29/13 1527  NA 138  K 3.5  CL 107  CO2 22  GLUCOSE 290*  BUN 12  CREATININE 0.62  CALCIUM 9.3   Liver Function Tests:  Recent Labs Lab 07/29/13 1527  AST 17  ALT 11  ALKPHOS 101  BILITOT 1.8*  PROT 5.8*  ALBUMIN 3.1*    Recent Labs Lab 07/29/13 1527  LIPASE 37    Recent Labs Lab 07/29/13 1528  AMMONIA 77*   CBC:  Recent Labs Lab 07/29/13 1527  WBC 5.2  NEUTROABS 3.3  HGB 13.2  HCT 37.5  MCV 83.5  PLT 123*   Cardiac Enzymes:  Recent Labs Lab 07/29/13 1527  TROPONINI <0.30      Radiological Exams on Admission: Dg Chest Port 1 View  07/29/2013   CLINICAL DATA:  Fall, altered mental status.  EXAM: PORTABLE CHEST - 1 VIEW  COMPARISON:  04/26/2013  FINDINGS: Cardiomegaly with vascular congestion. Diffuse interstitial prominence throughout the lungs could reflect early interstitial edema. No confluent opacities or effusions.   IMPRESSION: Cardiomegaly, question early interstitial edema.   Electronically Signed   By: Charlett Nose M.D.   On: 07/29/2013 16:12      Assessment/Plan Active Problems:   Confusion   UTI (urinary tract infection)   DIABETES, TYPE 2   HYPERTENSION   Essential hypertension   Bilateral leg weakness   Liver cirrhosis secondary to NASH   1. Confusion/altered mental status, presently improved. No evidence of hepatic encephalopathy. 2. UTI. 3. Type 2 diabetes mellitus. 4. Hypertension. 5. Morbid obesity. 6. History of bilateral leg weakness, unclear etiology. 7. Cirrhosis of the liver secondary to NASH. No evidence of liver decompensation at the present time.  Plan: 1. Admit to medical floor. 2. Start empirical intravenous antibiotics for UTI. 3. Gentle hydration. 4. Monitor her hepatic function closely.  Further recommendations will depend on patient's hospital progress.   Code Status: Full code  Family Communication: Discussed plan with patient and husband at the bedside.  Disposition Plan:  Home when medically stable.   Time spent: 60 minutes.  Wilson Singer [facility] Pager [phone].  If 7PM-7AM, please contact night-coverage [email or web address] Password [facility] 07/29/2013, 8:34 PM

## 2013-07-30 ENCOUNTER — Ambulatory Visit (HOSPITAL_COMMUNITY): Payer: Medicare Other

## 2013-07-30 DIAGNOSIS — K7290 Hepatic failure, unspecified without coma: Secondary | ICD-10-CM

## 2013-07-30 DIAGNOSIS — E119 Type 2 diabetes mellitus without complications: Secondary | ICD-10-CM

## 2013-07-30 DIAGNOSIS — K7682 Hepatic encephalopathy: Secondary | ICD-10-CM

## 2013-07-30 DIAGNOSIS — G9340 Encephalopathy, unspecified: Secondary | ICD-10-CM

## 2013-07-30 LAB — CBC
HCT: 33.6 % — ABNORMAL LOW (ref 36.0–46.0)
MCH: 29.6 pg (ref 26.0–34.0)
MCV: 84.4 fL (ref 78.0–100.0)
RDW: 15 % (ref 11.5–15.5)
WBC: 4.4 10*3/uL (ref 4.0–10.5)

## 2013-07-30 LAB — GLUCOSE, CAPILLARY
Glucose-Capillary: 159 mg/dL — ABNORMAL HIGH (ref 70–99)
Glucose-Capillary: 212 mg/dL — ABNORMAL HIGH (ref 70–99)
Glucose-Capillary: 335 mg/dL — ABNORMAL HIGH (ref 70–99)

## 2013-07-30 LAB — COMPREHENSIVE METABOLIC PANEL
AST: 16 U/L (ref 0–37)
Albumin: 2.7 g/dL — ABNORMAL LOW (ref 3.5–5.2)
Alkaline Phosphatase: 88 U/L (ref 39–117)
CO2: 23 mEq/L (ref 19–32)
Calcium: 8.3 mg/dL — ABNORMAL LOW (ref 8.4–10.5)
Creatinine, Ser: 0.68 mg/dL (ref 0.50–1.10)
Glucose, Bld: 238 mg/dL — ABNORMAL HIGH (ref 70–99)
Total Bilirubin: 1.4 mg/dL — ABNORMAL HIGH (ref 0.3–1.2)

## 2013-07-30 MED ORDER — MUSCLE RUB 10-15 % EX CREA: TOPICAL_CREAM | Freq: Every day | CUTANEOUS | Status: DC | PRN

## 2013-07-30 NOTE — Progress Notes (Signed)
UR chart review completed.  

## 2013-07-30 NOTE — Care Management Note (Addendum)
    Page 1 of 1   07/31/2013     3:45:05 PM   CARE MANAGEMENT NOTE 07/31/2013  Patient:  [name] [name], [name] [name]   Account Number:  [id number]  Date Initiated:  07/30/2013  Documentation initiated by:  Sharrie Rothman  Subjective/Objective Assessment:   Pt admitted from home with altered mental status. Pt lives with husband and will return home at discharge. Pt requires moderate assistance with ADL's. Pt has w/c, walk,er and cane for home use.     Action/Plan:   No Cm needs noted.   Anticipated DC Date:  07/31/2013   Anticipated DC Plan:  HOME/SELF CARE      DC Planning Services  CM consult      Choice offered to / List presented to:          [facility] arranged  HH-2 PT      [facility] agency  [facility]   Status of service:  Completed, signed off Medicare Important Message given?   (If response is "NO", the following Medicare IM given date fields will be blank) Date Medicare IM given:   Date Additional Medicare IM given:    Discharge Disposition:  HOME W HOME HEALTH SERVICES  Per UR Regulation:    If discussed at Long Length of Stay Meetings, dates discussed:    Comments:  07/30/13 1430 Arlyss Queen, RN BSN CM 07/31/13 Jamara Vary RN BSN CM 1540 Pt selected [facility] for home PT. Alroy Bailiff notified of referral.

## 2013-07-30 NOTE — Progress Notes (Signed)
[facility] PROGRESS NOTE  LEXIA VANDEVENDER NWG:[id number] DOB: [birth year] DOA: 07/29/2013 PCP: Colette Ribas, MD Gastroenterology: Dr. Kendell Bane  Assessment/Plan: 1. Acute encephalopathy: Appears resolved at this point. Suspect secondary to UTI, possible hepatic encephalopathy. Continue treatment as below. 2. Suspected UTI: Followup culture. Continue empiric antibiotic therapy. 3. Possible hepatic encephalopathy: Appears resolved. Continue lactulose. 4. Chronic right arm pain: Followed by Dr. Carola Frost, this is a long-standing issue. Outpatient CT was already scheduled for today. Will reschedule.  5. Chronic thrombocytopenia: Stable. Secondary to cirrhosis. 6. Cirrhosis, NASH 7. Diabetes mellitus type 2: Stable. Continue Levemir and sliding scale insulin. 8. Generalized weakness: Physical therapy consult.   Continue antibiotics, followup urine culture.  Physical therapy consultation  Pending studies:   Urine culture  Code Status: full code DVT prophylaxis: heparin  Family Communication: none present Disposition Plan: home 1-2 days  Brendia Sacks, MD  [facility]  Pager [phone] If 7PM-7AM, please contact night-coverage at [email or web address], password [facility] 07/30/2013, 1:16 PM  LOS: 1 day   Summary: 72 year old woman with complicated past medical history presented with a four-day history of confusion. This has been seen in the past, associated with UTI or hepatic encephalopathy secondary to cirrhosis.  Consultants:  none  Procedures:  None   Antibiotics:  Ceftriaxone 9/17 >>   HPI/Subjective: Feels generally weak. Chronic right shoulder pain.  Objective: Filed Vitals:   07/29/13 2209 07/29/13 2213 07/30/13 0503 07/30/13 0815  BP:   128/76   Pulse:   74   Temp: 98.2 F (36.8 C)  97.4 F (36.3 C)   TempSrc: Oral  Oral   Resp:   16   Height:      Weight:      SpO2:  97% 95% 96%    Intake/Output Summary (Last 24 hours) at 07/30/13 1316 Last data  filed at 07/30/13 1152  Gross per 24 hour  Intake 1252.5 ml  Output    650 ml  Net  602.5 ml     Filed Weights   07/29/13 1454 07/29/13 1923  Weight: 104.327 kg (230 lb) 105.2 kg (231 lb 14.8 oz)    Exam:   Afebrile, vital signs stable  General: Appears calm and comfortable. Speech fluent and clear.  Psychiatric: Grossly normal mood and affect. Speech fluent and appropriate. Oriented to self, month, year, location.  Data Reviewed:  Capillary blood sugars stable  Basic metabolic panel unremarkable.   Serum ammonia was elevated on admission  Total bilirubin somewhat improved   CBC noted, thrombocytopenia stable  Urinalysis equivocal  EKG: SR, LAFB, no acute changes  Scheduled Meds: . aspirin EC  81 mg Oral Daily  . budesonide-formoterol  2 puff Inhalation BID  . cefTRIAXone (ROCEPHIN)  IV  1 g Intravenous Q24H  . enalapril  5 mg Oral Daily  . furosemide  20 mg Oral Daily  . heparin  5,000 Units Subcutaneous Q8H  . insulin aspart  0-20 Units Subcutaneous TID WC  . insulin aspart  0-5 Units Subcutaneous QHS  . insulin detemir  60 Units Subcutaneous QHS  . lactulose  40 g Oral TID  . lisinopril  5 mg Oral Daily  . pantoprazole  40 mg Oral Daily  . pregabalin  100-200 mg Oral Daily   Continuous Infusions: . sodium chloride 75 mL/hr at 07/29/13 2322    Principal Problem:   Acute encephalopathy Active Problems:   DIABETES, TYPE 2   HYPERTENSION   Essential hypertension   Bilateral leg weakness   UTI (urinary tract  infection)   Liver cirrhosis secondary to NASH   Confusion   Encephalopathy, hepatic   Time spent 20 minutes

## 2013-07-31 DIAGNOSIS — K7290 Hepatic failure, unspecified without coma: Secondary | ICD-10-CM

## 2013-07-31 DIAGNOSIS — R5381 Other malaise: Secondary | ICD-10-CM

## 2013-07-31 MED ORDER — CIPROFLOXACIN HCL 500 MG PO TABS: 500.0000 mg | ORAL_TABLET | Freq: Two times a day (BID) | ORAL | Status: DC

## 2013-07-31 MED ORDER — OXYCODONE HCL 5 MG PO TABS: 5.0000 mg | ORAL_TABLET | ORAL | Status: DC | PRN

## 2013-07-31 MED ORDER — INFLUENZA VAC SPLIT QUAD 0.5 ML IM SUSP
0.5000 mL | Freq: Once | INTRAMUSCULAR | Status: AC
  Administered 2013-07-31: 0.5 mL via INTRAMUSCULAR
  Filled 2013-07-31: qty 0.5

## 2013-07-31 MED ORDER — INFLUENZA VAC SPLIT QUAD 0.5 ML IM SUSP
0.5000 mL | INTRAMUSCULAR | Status: DC
  Filled 2013-07-31: qty 0.5

## 2013-07-31 NOTE — Discharge Summary (Signed)
Pt stated she was ready to go home and pain was under control.  Pt educated and explained DC paper and S/Sx of UTI. (while husband was in the room).  IV was removed, script was given for pain and told of Cipro already sent to Pharm.  Pt wheeled to car by RN and family.

## 2013-07-31 NOTE — Evaluation (Signed)
Physical Therapy Evaluation Patient Details Name: [name] [name] MRN: [id number] DOB: 1941/08/14 Today's Date: 07/31/2013 Time: 4098-1191 PT Time Calculation (min): 44 min  PT Assessment / Plan / Recommendation History of Present Illness   Pt is admitted for confusion/UTI.  She has multiple medical problems including cirrhosis, asthma/COPD, hx of renal failure.  She sustained a fx to the right humerus with OTIF in June 2014 and has had ongoing pain with limited function in this arm since.  She has developed generalized weakness since this recent illness and states that she was ultimately unable to ambulate at home.  She normally does not use an assistive device for gait...she had been unable to use a walker due to RUE fx and could not coordinate the use of a cane.  Her husband is very concerned about having her return home.  Clinical Impression   Pt was seen for evaluation.  Her LE strength was quite good on MMT.  She was independent in transfers OOB and to/from chair.  Her major problem was c/o dizziness with sitting and standing.  The dizziness did not resolve over time nor did it have any change with head position.  We did finally realize that she has not had any Meclazine since she has been here....this is a daily med for her.  She is actually able to use a walker now for gait, and were it not for her vertigo she would have been functional with gait.  Currently, she is not safe to be walking until the Meclazine RN just gave her begins to take effect.  She is certainly deconditioned and I would recommend HHPT at d/c.    PT Assessment  All further PT needs can be met in the next venue of care    Follow Up Recommendations  Home health PT    Does the patient have the potential to tolerate intense rehabilitation      Barriers to Discharge        Equipment Recommendations  Rolling walker with 5" wheels (Needs wheels on walker-not sure if this is what she has)    Recommendations for Other  Services     Frequency      Precautions / Restrictions Precautions Precautions: Fall Restrictions Weight Bearing Restrictions: No   Pertinent Vitals/Pain       Mobility  Bed Mobility Bed Mobility: Supine to Sit Supine to Sit: 7: Independent Transfers Transfers: Sit to Stand;Stand to Sit Sit to Stand: 7: Independent;Without upper extremity assist Stand to Sit: 7: Independent;Without upper extremity assist Ambulation/Gait Ambulation/Gait Assistance: 3: Mod assist Ambulation Distance (Feet): 30 Feet Assistive device: Rolling walker Gait Pattern: Ataxic General Gait Details: pt has baseline vertigo...she normally takes Meclazine daily to control vertigo but has not had any since hospital stay (it has been PRN and pt did not know to ask for it)...gait would be stable with a walker were it not for dizziness. Stairs: No Wheelchair Mobility Wheelchair Mobility: No    Exercises     PT Diagnosis: Difficulty walking;Abnormality of gait;Generalized weakness (RUE pain is chronic)  PT Problem List: Decreased activity tolerance;Decreased mobility;Decreased knowledge of use of DME;Pain PT Treatment Interventions:       PT Goals(Current goals can be found in the care plan section) Acute Rehab PT Goals PT Goal Formulation: No goals set, d/c therapy  Visit Information  Last PT Received On: 07/31/13       Prior Functioning  Home Living Family/patient expects to be discharged to:: Private residence Living Arrangements: Spouse/significant  other Available Help at Discharge: Family;Available 24 hours/day Type of Home: House Home Access: Stairs to enter [employer] of Steps: 4 Entrance Stairs-Rails: Right;Left;Can reach both Home Layout: One level Home Equipment: Walker - standard;Wheelchair - [employer];Shower seat;Cane - single point Prior Function Level of Independence: Independent Comments: Pt is independent with ADLs up until recently, developed  generalized weakness Communication Communication: No difficulties    Cognition  Cognition Arousal/Alertness: Awake/alert Behavior During Therapy: WFL for tasks assessed/performed Overall Cognitive Status: Within Functional Limits for tasks assessed    Extremity/Trunk Assessment Upper Extremity Assessment Upper Extremity Assessment: RUE deficits/detail RUE Deficits / Details: limited strength in right shoulder (2/5 in flexion and abduction), elbow flexion/extension=3=/5, grip is WNL Lower Extremity Assessment Lower Extremity Assessment: Overall WFL for tasks assessed Cervical / Trunk Assessment Cervical / Trunk Assessment: Normal   Balance Balance Balance Assessed: No (unable due to vertigo)  End of Session PT - End of Session Equipment Utilized During Treatment: Gait belt Activity Tolerance: Patient tolerated treatment well Patient left: in chair;with call bell/phone within reach Nurse Communication: Mobility status  GP     Konrad Penta 07/31/2013, 2:37 PM

## 2013-07-31 NOTE — Progress Notes (Signed)
CBGs on 9/18  [phone] mg/dl      4/13   244-010 mg/dl Recommend adding Novolog 4-5 units meal coverage TID if CBGs continue greater than 180 mg/dl.  Will continue to follow while in hospital.  Smith Mince RN BSN CDE

## 2013-07-31 NOTE — Discharge Summary (Signed)
Physician Discharge Summary  [name] [name] JXB:[id number] DOB: 06/10/1941 DOA: 07/29/2013  PCP: Colette Ribas, MD  Admit date: 07/29/2013 Discharge date: 07/31/2013  Time spent: 25 minutes   Recommendations for Outpatient Follow-up:  1. Patient is being discharged home with home health PT 2. She'll followup with her primary care physician in the next one to 2 weeks 3. Patient is being discharged on by mouth Cipro for 5 more days for total of 7 days of therapy  Discharge Diagnoses:  Principal Problem:   Acute encephalopathy Active Problems:   DIABETES, TYPE 2   HYPERTENSION   Essential hypertension   Bilateral leg weakness   UTI (urinary tract infection)   Liver cirrhosis secondary to NASH   Confusion   Encephalopathy, hepatic   Discharge Condition: Improved, being discharged home with home health PT  Diet recommendation: Carb modified, low sodium  Filed Weights   07/29/13 1454 07/29/13 1923  Weight: 104.327 kg (230 lb) 105.2 kg (231 lb 14.8 oz)    History of present illness:  On 9/17: Patient is a 72 year old white female with past medical history of hepatic encephalopathy and UTIs which have caused confusion as well as a recent discharge from skilled nursing for physical therapy after fall which led to injured shoulder. Patient had 4 days of confusion at home and was brought into the emergency room. In the emergency room, she was noted to have mildly elevated ammonia level of 77 and a moderate UTI. Patient was started on lactulose and IV antibiotics and by the following day, looked to be improving.  Hospital Course:  Principal Problem:   Acute encephalopathy: Felt to be secondary to UTI and/or hepatic encephalopathy. Active Problems:   DIABETES, TYPE 2: Moderately elevated blood sugars as patient's confusion improved and appetite improved, blood sugar starting to trend up.    Essential hypertension: Stable. Continue on home meds.    Bilateral leg weakness: Patient  evaluated by physical therapy who recommended home health PT    UTI (urinary tract infection): Urine cultures growing out Escherichia coli, sensitivities pending. Patient has been on several days of IV Rocephin will be discharged on by mouth Cipro. We'll follow up final cultures to confirm tolerance to Cipro    Liver cirrhosis secondary to NASH: Stable. See below.    Confusion: Result. Husband agrees patient is back at her baseline.    Encephalopathy, hepatic: Started back on lactulose which patient will continue at home.   Procedures:  None  Consultations:  None  Discharge Exam: Filed Vitals:   07/31/13 0905  BP: 120/62  Pulse:   Temp:   Resp:     General: Alert and oriented x2, no acute distress Cardiovascular: Regular rate and rhythm, S1-S2 Respiratory: Clear to auscultation bilaterally Abdomen: Soft, obese, nontender, positive bowel sounds Extremities: No clubbing or cyanosis, trace pitting edema  Discharge Instructions  Discharge Orders   Future Orders Complete By Expires   Diet - low sodium heart healthy  As directed    Diet Carb Modified  As directed    Increase activity slowly  As directed    Walk with assistance  As directed        Medication List    STOP taking these medications       oxyCODONE 10 MG 12 hr tablet  Commonly known as:  OXYCONTIN  Replaced by:  oxyCODONE 5 MG immediate release tablet      TAKE these medications       ALPRAZolam 0.5 MG tablet  Commonly known as:  XANAX  Take 0.5 mg by mouth 3 (three) times daily as needed for anxiety.     BAYER LOW STRENGTH 81 MG EC tablet  Generic drug:  aspirin  Take 81 mg by mouth daily.     BIOFREEZE 4 % Gel  Generic drug:  Menthol (Topical Analgesic)  Apply 1 application topically daily as needed (for arm pain).     budesonide-formoterol 160-4.5 MCG/ACT inhaler  Commonly known as:  SYMBICORT  Inhale 2 puffs into the lungs 2 (two) times daily.     CENTRUM SILVER PO  Take 1 tablet by  mouth daily.     ciprofloxacin 500 MG tablet  Commonly known as:  CIPRO  Take 1 tablet (500 mg total) by mouth 2 (two) times daily.     enalapril 5 MG tablet  Commonly known as:  VASOTEC  Take 5 mg by mouth daily.     esomeprazole 40 MG capsule  Commonly known as:  NEXIUM  Take 40 mg by mouth every morning.     furosemide 20 MG tablet  Commonly known as:  LASIX  Take 20 mg by mouth daily. Started on 04/12/2013 for 7 days     insulin aspart 100 UNIT/ML injection  Commonly known as:  novoLOG  Inject 10-14 Units into the skin daily as needed (90-150= 10 units 151-200= 11 units 201-250= 12 units 250-300= 13 units 301-350= 14 units as directed per sliding scale instructions). Per sliding scale. Patient injects 10-40 units for levels above 150 to 300     insulin detemir 100 UNIT/ML injection  Commonly known as:  LEVEMIR  Inject 60 Units into the skin at bedtime.     lactulose 10 GM/15ML solution  Commonly known as:  CHRONULAC  Take 60 mLs (40 g total) by mouth 3 (three) times daily.     meclizine 25 MG tablet  Commonly known as:  ANTIVERT  Take 25 mg by mouth 3 (three) times daily as needed for dizziness or nausea.     oxyCODONE 5 MG immediate release tablet  Commonly known as:  Oxy IR/ROXICODONE  Take 1 tablet (5 mg total) by mouth every 4 (four) hours as needed.     pregabalin 100 MG capsule  Commonly known as:  LYRICA  Take 100-200 mg by mouth daily.     quinapril 5 MG tablet  Commonly known as:  ACCUPRIL  Take 5 mg by mouth daily.     triamcinolone cream 0.1 %  Commonly known as:  KENALOG  Apply 1 application topically daily as needed (for irritation).       Allergies  Allergen Reactions  . Morphine And Related Itching  . Prochlorperazine Edisylate Other (See Comments)    Causes anxiety/numbness  . Compazine [Prochlorperazine Edisylate] Anxiety    Causes anxiety & nervousness      The results of significant diagnostics from this hospitalization (including  imaging, microbiology, ancillary and laboratory) are listed below for reference.    Significant Diagnostic Studies: Dg Chest Port 1 View  07/29/2013     IMPRESSION: Cardiomegaly, question early interstitial edema.   Electronically Signed   By: Charlett Nose M.D.   On: 07/29/2013 16:12    Microbiology: Recent Results (from the past 240 hour(s))  URINE CULTURE     Status: None   Collection Time    07/29/13  4:09 PM      Result Value Range Status   Specimen Description URINE, CATHETERIZED   Final   Special  Requests NONE   Final   Culture  Setup Time     Final   Value: 07/30/2013 01:19     Performed at [facility] Count     Final   Value: >=100,000 COLONIES/ML     Performed at [facility]   Culture     Final   Value: ESCHERICHIA COLI     Performed at [facility]   Report Status PENDING   Incomplete     Labs: Basic Metabolic Panel:  Recent Labs Lab 07/29/13 1527 07/30/13 0521  NA 138 140  K 3.5 3.6  CL 107 109  CO2 22 23  GLUCOSE 290* 238*  BUN 12 11  CREATININE 0.62 0.68  CALCIUM 9.3 8.3*   Liver Function Tests:  Recent Labs Lab 07/29/13 1527 07/30/13 0521  AST 17 16  ALT 11 10  ALKPHOS 101 88  BILITOT 1.8* 1.4*  PROT 5.8* 5.1*  ALBUMIN 3.1* 2.7*    Recent Labs Lab 07/29/13 1527  LIPASE 37    Recent Labs Lab 07/29/13 1528  AMMONIA 77*   CBC:  Recent Labs Lab 07/29/13 1527 07/30/13 0521  WBC 5.2 4.4  NEUTROABS 3.3  --   HGB 13.2 11.8*  HCT 37.5 33.6*  MCV 83.5 84.4  PLT 123* 108*   Cardiac Enzymes:  Recent Labs Lab 07/29/13 1527  TROPONINI <0.30   CBG:  Recent Labs Lab 07/30/13 1129 07/30/13 1649 07/30/13 2101 07/31/13 0740 07/31/13 1153  GLUCAP 212* 335* 271* 205* 293*       Signed:  Brilynn Biasi K  [facility] 07/31/2013, 3:14 PM

## 2013-08-01 LAB — URINE CULTURE: Colony Count: >=100000

## 2013-08-10 ENCOUNTER — Ambulatory Visit (HOSPITAL_COMMUNITY)
Admission: RE | Admit: 2013-08-10 | Discharge: 2013-08-10 | Disposition: A | Discharge status: Home / Self Care | Payer: Medicare Other | Source: Ambulatory Visit | Attending: Orthopedic Surgery | Admitting: Orthopedic Surgery

## 2013-08-10 DIAGNOSIS — IMO0002 Reserved for concepts with insufficient information to code with codable children: Secondary | ICD-10-CM

## 2013-08-10 DIAGNOSIS — S42309D Unspecified fracture of shaft of humerus, unspecified arm, subsequent encounter for fracture with routine healing: Secondary | ICD-10-CM | POA: Insufficient documentation

## 2013-08-11 ENCOUNTER — Ambulatory Visit (INDEPENDENT_AMBULATORY_CARE_PROVIDER_SITE_OTHER): Payer: Medicare Other | Admitting: Urology

## 2013-08-11 DIAGNOSIS — N3020 Other chronic cystitis without hematuria: Secondary | ICD-10-CM

## 2013-08-11 DIAGNOSIS — N952 Postmenopausal atrophic vaginitis: Secondary | ICD-10-CM

## 2013-08-24 ENCOUNTER — Other Ambulatory Visit (HOSPITAL_COMMUNITY): Payer: Self-pay | Admitting: Family Medicine

## 2013-08-24 ENCOUNTER — Ambulatory Visit (HOSPITAL_COMMUNITY)
Admission: RE | Admit: 2013-08-24 | Discharge: 2013-08-24 | Disposition: A | Discharge status: Home / Self Care | Payer: Medicare Other | Source: Ambulatory Visit | Attending: Family Medicine | Admitting: Family Medicine

## 2013-08-24 DIAGNOSIS — M25551 Pain in right hip: Secondary | ICD-10-CM

## 2013-08-24 DIAGNOSIS — M76899 Other specified enthesopathies of unspecified lower limb, excluding foot: Secondary | ICD-10-CM | POA: Insufficient documentation

## 2013-08-24 DIAGNOSIS — M25559 Pain in unspecified hip: Secondary | ICD-10-CM | POA: Insufficient documentation

## 2013-08-30 ENCOUNTER — Emergency Department (HOSPITAL_COMMUNITY): Payer: Medicare Other

## 2013-08-30 ENCOUNTER — Inpatient Hospital Stay (HOSPITAL_COMMUNITY)
Admission: EM | Admit: 2013-08-30 | Discharge: 2013-09-01 | DRG: 442 | Disposition: A | Discharge status: Home Health | Payer: Medicare Other | Attending: Family Medicine | Admitting: Family Medicine

## 2013-08-30 ENCOUNTER — Encounter (HOSPITAL_COMMUNITY): Payer: Self-pay | Admitting: Emergency Medicine

## 2013-08-30 DIAGNOSIS — R5381 Other malaise: Secondary | ICD-10-CM

## 2013-08-30 DIAGNOSIS — K7682 Hepatic encephalopathy: Principal | ICD-10-CM | POA: Diagnosis present

## 2013-08-30 DIAGNOSIS — K219 Gastro-esophageal reflux disease without esophagitis: Secondary | ICD-10-CM | POA: Diagnosis present

## 2013-08-30 DIAGNOSIS — G9340 Encephalopathy, unspecified: Secondary | ICD-10-CM

## 2013-08-30 DIAGNOSIS — I10 Essential (primary) hypertension: Secondary | ICD-10-CM

## 2013-08-30 DIAGNOSIS — E119 Type 2 diabetes mellitus without complications: Secondary | ICD-10-CM | POA: Diagnosis present

## 2013-08-30 DIAGNOSIS — K7460 Unspecified cirrhosis of liver: Secondary | ICD-10-CM | POA: Diagnosis present

## 2013-08-30 DIAGNOSIS — I2510 Atherosclerotic heart disease of native coronary artery without angina pectoris: Secondary | ICD-10-CM | POA: Diagnosis present

## 2013-08-30 DIAGNOSIS — N390 Urinary tract infection, site not specified: Secondary | ICD-10-CM | POA: Diagnosis present

## 2013-08-30 DIAGNOSIS — Z794 Long term (current) use of insulin: Secondary | ICD-10-CM

## 2013-08-30 DIAGNOSIS — D696 Thrombocytopenia, unspecified: Secondary | ICD-10-CM | POA: Diagnosis present

## 2013-08-30 DIAGNOSIS — K7469 Other cirrhosis of liver: Secondary | ICD-10-CM | POA: Diagnosis present

## 2013-08-30 DIAGNOSIS — R4182 Altered mental status, unspecified: Secondary | ICD-10-CM

## 2013-08-30 DIAGNOSIS — A498 Other bacterial infections of unspecified site: Secondary | ICD-10-CM | POA: Diagnosis present

## 2013-08-30 DIAGNOSIS — G608 Other hereditary and idiopathic neuropathies: Secondary | ICD-10-CM | POA: Diagnosis present

## 2013-08-30 DIAGNOSIS — E785 Hyperlipidemia, unspecified: Secondary | ICD-10-CM | POA: Diagnosis present

## 2013-08-30 DIAGNOSIS — E7220 Disorder of urea cycle metabolism, unspecified: Secondary | ICD-10-CM | POA: Diagnosis present

## 2013-08-30 DIAGNOSIS — Z833 Family history of diabetes mellitus: Secondary | ICD-10-CM

## 2013-08-30 DIAGNOSIS — K7290 Hepatic failure, unspecified without coma: Principal | ICD-10-CM | POA: Diagnosis present

## 2013-08-30 DIAGNOSIS — R531 Weakness: Secondary | ICD-10-CM | POA: Diagnosis present

## 2013-08-30 DIAGNOSIS — E669 Obesity, unspecified: Secondary | ICD-10-CM | POA: Diagnosis present

## 2013-08-30 DIAGNOSIS — K7689 Other specified diseases of liver: Secondary | ICD-10-CM | POA: Diagnosis present

## 2013-08-30 DIAGNOSIS — Z8249 Family history of ischemic heart disease and other diseases of the circulatory system: Secondary | ICD-10-CM

## 2013-08-30 HISTORY — DX: Other symptoms and signs involving the musculoskeletal system: R29.898

## 2013-08-30 LAB — URINALYSIS W MICROSCOPIC + REFLEX CULTURE
Glucose, UA: 500 mg/dL — AB
Leukocytes, UA: NEGATIVE
Specific Gravity, Urine: >1.03 — ABNORMAL HIGH (ref 1.005–1.030)
pH: 6 (ref 5.0–8.0)

## 2013-08-30 LAB — BASIC METABOLIC PANEL
Chloride: 104 mEq/L (ref 96–112)
GFR calc Af Amer: >90 mL/min (ref >90)
GFR calc non Af Amer: 86 mL/min — ABNORMAL LOW (ref >90)
Potassium: 4 mEq/L (ref 3.5–5.1)
Sodium: 137 mEq/L (ref 135–145)

## 2013-08-30 LAB — CBC WITH DIFFERENTIAL/PLATELET
Basophils Relative: 1 % (ref 0–1)
Eosinophils Absolute: 0.1 10*3/uL (ref 0.0–0.7)
HCT: 41.4 % (ref 36.0–46.0)
Hemoglobin: 14.7 g/dL (ref 12.0–15.0)
Lymphocytes Relative: 29 % (ref 12–46)
MCV: 85 fL (ref 78.0–100.0)
RDW: 14.2 % (ref 11.5–15.5)
WBC: 4.2 10*3/uL (ref 4.0–10.5)

## 2013-08-30 LAB — GLUCOSE, CAPILLARY
Glucose-Capillary: 253 mg/dL — ABNORMAL HIGH (ref 70–99)
Glucose-Capillary: 306 mg/dL — ABNORMAL HIGH (ref 70–99)

## 2013-08-30 LAB — HEPATIC FUNCTION PANEL
AST: 24 U/L (ref 0–37)
Albumin: 3.4 g/dL — ABNORMAL LOW (ref 3.5–5.2)

## 2013-08-30 MED ORDER — ONDANSETRON HCL 4 MG/2ML IJ SOLN: 4.0000 mg | Freq: Four times a day (QID) | INTRAMUSCULAR | Status: DC | PRN

## 2013-08-30 MED ORDER — SODIUM CHLORIDE 0.9 % IJ SOLN
3.0000 mL | Freq: Two times a day (BID) | INTRAMUSCULAR | Status: DC
  Administered 2013-08-31: 3 mL via INTRAVENOUS

## 2013-08-30 MED ORDER — ONDANSETRON HCL 4 MG PO TABS: 4.0000 mg | ORAL_TABLET | Freq: Four times a day (QID) | ORAL | Status: DC | PRN

## 2013-08-30 MED ORDER — PANTOPRAZOLE SODIUM 40 MG PO TBEC
40.0000 mg | DELAYED_RELEASE_TABLET | Freq: Every day | ORAL | Status: DC
  Administered 2013-08-31 – 2013-09-01 (×2): 40 mg via ORAL
  Filled 2013-08-30 (×2): qty 1

## 2013-08-30 MED ORDER — SODIUM CHLORIDE 0.9 % IJ SOLN: 3.0000 mL | INTRAMUSCULAR | Status: DC | PRN

## 2013-08-30 MED ORDER — INSULIN DETEMIR 100 UNIT/ML ~~LOC~~ SOLN
SUBCUTANEOUS | Status: AC
  Filled 2013-08-30: qty 1

## 2013-08-30 MED ORDER — INSULIN DETEMIR 100 UNIT/ML ~~LOC~~ SOLN
60.0000 [IU] | Freq: Every day | SUBCUTANEOUS | Status: DC
  Filled 2013-08-30: qty 0.6

## 2013-08-30 MED ORDER — INSULIN DETEMIR 100 UNIT/ML ~~LOC~~ SOLN
30.0000 [IU] | Freq: Every day | SUBCUTANEOUS | Status: DC
  Administered 2013-08-30: 30 [IU] via SUBCUTANEOUS
  Filled 2013-08-30 (×2): qty 0.3

## 2013-08-30 MED ORDER — LISINOPRIL 5 MG PO TABS
5.0000 mg | ORAL_TABLET | Freq: Every day | ORAL | Status: DC
  Administered 2013-08-31 – 2013-09-01 (×2): 5 mg via ORAL
  Filled 2013-08-30 (×2): qty 1

## 2013-08-30 MED ORDER — LACTULOSE 10 GM/15ML PO SOLN
40.0000 g | ORAL | Status: DC
  Administered 2013-08-30 – 2013-08-31 (×5): 40 g via ORAL
  Filled 2013-08-30 (×5): qty 60

## 2013-08-30 MED ORDER — SODIUM CHLORIDE 0.9 % IV SOLN: 250.0000 mL | INTRAVENOUS | Status: DC | PRN

## 2013-08-30 MED ORDER — ASPIRIN EC 81 MG PO TBEC
81.0000 mg | DELAYED_RELEASE_TABLET | Freq: Every day | ORAL | Status: DC
  Administered 2013-08-31 – 2013-09-01 (×2): 81 mg via ORAL
  Filled 2013-08-30 (×2): qty 1

## 2013-08-30 MED ORDER — SODIUM CHLORIDE 0.9 % IV SOLN
INTRAVENOUS | Status: DC
  Administered 2013-08-30: 17:00:00 via INTRAVENOUS

## 2013-08-30 MED ORDER — ASPIRIN 81 MG PO TBEC: 81.0000 mg | DELAYED_RELEASE_TABLET | Freq: Every day | ORAL | Status: DC

## 2013-08-30 MED ORDER — BUDESONIDE-FORMOTEROL FUMARATE 160-4.5 MCG/ACT IN AERO
INHALATION_SPRAY | RESPIRATORY_TRACT | Status: AC
  Filled 2013-08-30: qty 6

## 2013-08-30 MED ORDER — ENOXAPARIN SODIUM 40 MG/0.4ML ~~LOC~~ SOLN
40.0000 mg | SUBCUTANEOUS | Status: DC
  Administered 2013-08-30 – 2013-08-31 (×2): 40 mg via SUBCUTANEOUS
  Filled 2013-08-30 (×2): qty 0.4

## 2013-08-30 MED ORDER — BUDESONIDE-FORMOTEROL FUMARATE 160-4.5 MCG/ACT IN AERO
2.0000 | INHALATION_SPRAY | Freq: Two times a day (BID) | RESPIRATORY_TRACT | Status: DC
  Administered 2013-08-30 – 2013-09-01 (×4): 2 via RESPIRATORY_TRACT
  Filled 2013-08-30: qty 6

## 2013-08-30 NOTE — ED Notes (Signed)
Unable to start IV at this time. Attempted x 3 without success.

## 2013-08-30 NOTE — ED Notes (Signed)
Lab at bedside attempting to draw lactic acid. Unable to obtain a sample that is not clotted at this time.

## 2013-08-30 NOTE — ED Notes (Signed)
Husband reports pt presently has UTI and cirrhosis of the liver.  Reports pt has had confusion and generalized weakness for the past few days.  Pt denies pain, very sleepy in triage.  Pt c/o dizziness.

## 2013-08-30 NOTE — H&P (Signed)
History and Physical  [name] [name] ZOX:[id number] DOB: Apr 01, 1941 DOA: 08/30/2013  Referring physician: Dr. Merlene Laughter in ED PCP: [name] Ribas, MD   Chief Complaint: confusion  HPI:  72 year old woman with history of NASH cirrhosis presented to the emergency department with altered mental status for one week, generalized weakness and sleepiness.  History obtained from patient, daughter and husband at bedside. Patient has been on Keflex for 2 weeks for UTI. Over the last week she has developed increasing confusion, primarily manifested in difficulty using common household objects such as a remote control. No hallucinations. Able to recognize family members. She has had progressive generalized weakness with it in ability to work with home health physical therapy for the last 2 sessions. She has been unable to transfer the last 2 days. She has a history of chronic right arm pain secondary to fractures and was recently started on oxycodone 5 days ago. She has been compliant with her medications including lactulose. Family was concerned about ongoing confusion and worried about hyperammonemia. The patient was able to provide some history.  In the emergency department noted to be afebrile with stable vital signs. Complete metabolic panel notable for total bilirubin 2.8. Ammonia level 117. Chest x-ray no acute disease. EKG not acute.  Review of Systems:  Negative for fever, visual changes, sore throat, rash, new muscle aches, chest pain, SOB, dysuria, bleeding, n/v/abdominal pain.  Past Medical History  Diagnosis Date  . CAD in native artery   . Thrombocytopenia, unspecified   . Unspecified hereditary and idiopathic peripheral neuropathy   . Unspecified fall   . Other and unspecified hyperlipidemia   . Unspecified essential hypertension   . Obesity, unspecified   . Shortness of breath   . Personal history of neurosis   . Intrinsic asthma, unspecified   . Allergic rhinitis, cause  unspecified   . Cirrhosis of liver     NASH, afp on 06/17/12 =3.7, per pt she had hep B vaccines in 1996  . Colon adenomas 03/08/10    tcs by Dr. Jena Gauss  . Hyperplastic polyps of stomach 03/08/10    tcs by Dr. Geni Bers  . Chronic gastritis 03/09/11    egd by Dr. Jena Gauss  . History of hemorrhoids 03/08/10    tcs- internal and external  . Diverticula of colon 03/08/10    L side  . Hiatal hernia 03/08/10  . Esophagitis, erosive 03/08/10  . GERD (gastroesophageal reflux disease)   . Wears glasses   . Type II or unspecified type diabetes mellitus without mention of complication, not stated as uncontrolled   . Vertigo   . Pancreatitis   . Bilateral leg weakness     Past Surgical History  Procedure Laterality Date  . Hysterectomy and btl      s/p  . Abdominal hysterectomy    . Tumor excision  2003    rt arm and left foot  . Colonoscopy  03/08/10    Dr. [name]-->ext/int hemorrhoids, anal paipilla, rectal polyps, desc polyps, cecal polyp, left-sided diverticula. suboptiomal prep. next TCS 02/2015. multiple adenomas  . Esophagogastroduodenoscopy  03/08/10    Dr. Dellie Catholic erosive RE, small hh, antral erosions, small 1cm are of mucosal indentation along gastric body of doubtful significance, cystic nodularity of hypophyarynx, base of tongue, base of epiglottis. benign gastric biopsies  . Tubal ligation    . Esophagogastroduodenoscopy  10/08/2011    Dr. Dionisio David esophageal varices, antral erosion. next egd 09/2013  . Foot surgery  2003    lt  foot  . Breast surgery    . Eye surgery      cataracts  . Orif humerus fracture Right 02/17/2013    Procedure: OPEN REDUCTION INTERNAL FIXATION (ORIF) RIGHT PROXIMAL HUMERUS FRACTURE;  Surgeon: Budd Palmer, MD;  Location: [facility] OR;  Service: Orthopedics;  Laterality: Right;  . Partial gastrectomy    . Cardiac catheterization  02/25/2007    Dr. Clarene Duke:  normal LV systolic function, mild irregularities of LAD    Social History:  reports that she has never smoked.  She does not have any smokeless tobacco history on file. She reports that she does not drink alcohol or use illicit drugs.  Allergies  Allergen Reactions  . Morphine And Related Itching  . Prochlorperazine Edisylate Other (See Comments)    Causes anxiety/numbness  . Compazine [Prochlorperazine Edisylate] Anxiety    Causes anxiety & nervousness    Family History  Problem Relation Age of Onset  . Coronary artery disease      FH  . Diabetes      FH  . Heart disease Mother   . Colon cancer Neg Hx      Prior to Admission medications   Medication Sig Start Date End Date Taking? Authorizing Provider  ALPRAZolam Prudy Feeler) 0.5 MG tablet Take 0.5 mg by mouth 3 (three) times daily as needed for anxiety. 08/09/12   Erick Blinks, MD  aspirin (BAYER LOW STRENGTH) 81 MG EC tablet Take 81 mg by mouth daily.      Historical Provider, MD  budesonide-formoterol (SYMBICORT) 160-4.5 MCG/ACT inhaler Inhale 2 puffs into the lungs 2 (two) times daily.      Historical Provider, MD  ciprofloxacin (CIPRO) 500 MG tablet Take 1 tablet (500 mg total) by mouth 2 (two) times daily. 07/31/13   Hollice Espy, MD  enalapril (VASOTEC) 5 MG tablet Take 5 mg by mouth daily.      Historical Provider, MD  esomeprazole (NEXIUM) 40 MG capsule Take 40 mg by mouth every morning.    Historical Provider, MD  furosemide (LASIX) 20 MG tablet Take 20 mg by mouth daily. Started on 04/12/2013 for 7 days    Historical Provider, MD  insulin aspart (NOVOLOG) 100 UNIT/ML injection Inject 10-14 Units into the skin daily as needed (90-150= 10 units 151-200= 11 units 201-250= 12 units 250-300= 13 units 301-350= 14 units as directed per sliding scale instructions). Per sliding scale. Patient injects 10-40 units for levels above 150 to 300    Historical Provider, MD  insulin detemir (LEVEMIR) 100 UNIT/ML injection Inject 60 Units into the skin at bedtime.     Historical Provider, MD  lactulose (CHRONULAC) 10 GM/15ML solution Take 60 mLs (40  g total) by mouth 3 (three) times daily. 04/13/13   Nira Retort, NP  meclizine (ANTIVERT) 25 MG tablet Take 25 mg by mouth 3 (three) times daily as needed for dizziness or nausea.     Historical Provider, MD  Menthol, Topical Analgesic, (BIOFREEZE) 4 % GEL Apply 1 application topically daily as needed (for arm pain).    Historical Provider, MD  Multiple Vitamins-Minerals (CENTRUM SILVER PO) Take 1 tablet by mouth daily.     Historical Provider, MD  oxyCODONE (OXY IR/ROXICODONE) 5 MG immediate release tablet Take 1 tablet (5 mg total) by mouth every 4 (four) hours as needed. 07/31/13   Hollice Espy, MD  pregabalin (LYRICA) 100 MG capsule Take 100-200 mg by mouth daily.     Historical Provider, MD  quinapril (ACCUPRIL)  5 MG tablet Take 5 mg by mouth daily.    Historical Provider, MD  triamcinolone cream (KENALOG) 0.1 % Apply 1 application topically daily as needed (for irritation).  06/26/13   Historical Provider, MD   Physical Exam: Filed Vitals:   08/30/13 1235  BP: 112/56  Pulse: 70  Temp: 97.7 F (36.5 C)  TempSrc: Oral  Resp: 18  Height: 5\' 8"  (1.727 m)  Weight: 103.874 kg (229 lb)  SpO2: 99%   General: Examined in the emergency department. Appears calm and comfortable. Nontoxic. Eyes: PERRL, normal lids, irises  ENT: grossly normal hearing, lips & tongue Neck: no LAD, masses or thyromegaly Cardiovascular: RRR, no m/r/g. No LE edema. Respiratory: CTA bilaterally, no w/r/r. Normal respiratory effort. Abdomen: soft, ntnd, obese, no ascites appreciated Skin: no rash or induration seen  Musculoskeletal: grossly normal tone BUE/BLE. Able to lift both legs off the bed. Able to lift left arm off the bed. Limited range of motion in the right arm secondary to history of fractures. Psychiatric: grossly normal mood and affect, speech fluent and appropriate. She is oriented self, month, year, hospital. Neurologic: grossly non-focal.  Wt Readings from Last 3 Encounters:  08/30/13 103.874 kg  (229 lb)  07/29/13 105.2 kg (231 lb 14.8 oz)  04/27/13 109.362 kg (241 lb 1.6 oz)    Labs on Admission:  Basic Metabolic Panel:  Recent Labs Lab 08/30/13 1300  NA 137  K 4.0  CL 104  CO2 23  GLUCOSE 250*  BUN 11  CREATININE 0.69  CALCIUM 9.5    Liver Function Tests:  Recent Labs Lab 08/30/13 1300  AST 24  ALT 12  ALKPHOS 127*  BILITOT 2.8*  PROT 6.4  ALBUMIN 3.4*    Recent Labs Lab 08/30/13 1300  AMMONIA 117*    CBC:  Recent Labs Lab 08/30/13 1300  WBC 4.2  NEUTROABS 2.5  HGB 14.7  HCT 41.4  MCV 85.0  PLT 123*    Radiological Exams on Admission: Dg Chest 2 View  08/30/2013   CLINICAL DATA:  Weakness. Altered mental status.  EXAM: CHEST  2 VIEW  COMPARISON:  07/29/2013.  FINDINGS: Positioning is lordotic. Cardiomegaly appears stable. There is vascular congestion without overt pulmonary edema. There is no pleural effusion or confluent airspace opacity. The osseous structures appear normal.  IMPRESSION: Stable cardiomegaly. No acute cardiopulmonary process.   Electronically Signed   By: Roxy Horseman M.D.   On: 08/30/2013 14:39    EKG: Independently reviewed. Normal sinus rhythm. Left anterior fascicular block. Nonspecific T-wave changes. No acute changes seen.   Principal Problem:   Acute encephalopathy Active Problems:   DIABETES, TYPE 2   THROMBOCYTOPENIA, CHRONIC   CIRRHOSIS   Generalized weakness   Liver cirrhosis secondary to NASH   Encephalopathy, hepatic   Assessment/Plan 1. Acute encephalopathy: Suspect secondary to hepatic encephalopathy by history. Also may be related to UTI and narcotics recently prescribed. Hold Xanax. Treatment as below. 2. Hepatic encephalopathy, hyperammonemia: Lactulose. Repeat ammonia level in the morning. 3. Recent UTI: Urinalysis equivocal, however the patient has been on Keflex for 2 weeks. Followup culture. Hold antibiotics for now. 4. Generalized weakness: Likely secondary to acute encephalopathy,  possible UTI. Superimposed on chronic weakness. Physical therapy consultation. 5. Cirrhosis secondary to NASH: With associated chronic thrombocytopenia. Bilirubin slightly higher than usual, repeat complete metabolic panel in the morning. No evidence of significant decompensation at this point. 6. DM type 2: Continue Levemir. SSI.  Discussed results of initial workup with husband and  daughter at bedside. Discussed current diagnoses and treatment plan.  Code Status: full code  DVT prophylaxis: SCDs Family Communication:  Disposition Plan/Anticipated LOS: Admit, 2-3 days  Time spent: 50 minutes  Brendia Sacks, MD  [facility] Pager [phone] 08/30/2013, 3:42 PM

## 2013-08-30 NOTE — ED Notes (Signed)
Pt presents D/T confusion and weakness per family members. Pt is alert and oriented x 4 at this time. Pt denies pain, does admit she is especially weak at this time. NAD noted.

## 2013-08-30 NOTE — ED Provider Notes (Signed)
CSN: [id number]     Arrival date & time 08/30/13  1231 History   First MD Initiated Contact with Patient 08/30/13 1306     Chief Complaint  Patient presents with  . Altered Mental Status    Patient is a 72 y.o. female presenting with altered mental status. The history is provided by the patient and a caregiver. The history is limited by the condition of the patient (AMS).  Altered Mental Status Pt was seen at 1310.  Per pt and her family, c/o gradual onset and worsening of persistent AMS for the past 1 week. Family describes pt's symptoms as confused, generally weak, and "sleepy."  At baseline, but uses wheelchair and stands for transfers, but has been unable to stand up for the past 1 week. Family concerned regarding pt's "ammonia level" and possible UTI (recently tx for same with keflex). Endorse pt has been taking her lactulose as prescribed. No reported falls, no focal motor weakness, no fevers. Pt herself denies CP/SOB, no cough, no N/V/D, no abd pain.    Past Medical History  Diagnosis Date  . CAD in native artery   . Thrombocytopenia, unspecified   . Unspecified hereditary and idiopathic peripheral neuropathy   . Unspecified fall   . Other and unspecified hyperlipidemia   . Unspecified essential hypertension   . Obesity, unspecified   . Shortness of breath   . Personal history of neurosis   . Intrinsic asthma, unspecified   . Allergic rhinitis, cause unspecified   . Cirrhosis of liver     NASH, afp on 06/17/12 =3.7, per pt she had hep B vaccines in 1996  . Colon adenomas 03/08/10    tcs by Dr. Jena Gauss  . Hyperplastic polyps of stomach 03/08/10    tcs by Dr. Geni Bers  . Chronic gastritis 03/09/11    egd by Dr. Jena Gauss  . History of hemorrhoids 03/08/10    tcs- internal and external  . Diverticula of colon 03/08/10    L side  . Hiatal hernia 03/08/10  . Esophagitis, erosive 03/08/10  . GERD (gastroesophageal reflux disease)   . Wears glasses   . Type II or unspecified type diabetes  mellitus without mention of complication, not stated as uncontrolled   . Vertigo   . Pancreatitis   . Bilateral leg weakness    Past Surgical History  Procedure Laterality Date  . Hysterectomy and btl      s/p  . Abdominal hysterectomy    . Tumor excision  2003    rt arm and left foot  . Colonoscopy  03/08/10    Dr. [name]-->ext/int hemorrhoids, anal paipilla, rectal polyps, desc polyps, cecal polyp, left-sided diverticula. suboptiomal prep. next TCS 02/2015. multiple adenomas  . Esophagogastroduodenoscopy  03/08/10    Dr. Dellie Catholic erosive RE, small hh, antral erosions, small 1cm are of mucosal indentation along gastric body of doubtful significance, cystic nodularity of hypophyarynx, base of tongue, base of epiglottis. benign gastric biopsies  . Tubal ligation    . Esophagogastroduodenoscopy  10/08/2011    Dr. Dionisio David esophageal varices, antral erosion. next egd 09/2013  . Foot surgery  2003    lt foot  . Breast surgery    . Eye surgery      cataracts  . Orif humerus fracture Right 02/17/2013    Procedure: OPEN REDUCTION INTERNAL FIXATION (ORIF) RIGHT PROXIMAL HUMERUS FRACTURE;  Surgeon: Budd Palmer, MD;  Location: [facility] OR;  Service: Orthopedics;  Laterality: Right;  . Partial gastrectomy    .  Cardiac catheterization  02/25/2007    Dr. Clarene Duke:  normal LV systolic function, mild irregularities of LAD   Family History  Problem Relation Age of Onset  . Coronary artery disease      FH  . Diabetes      FH  . Heart disease Mother   . Colon cancer Neg Hx    History  Substance Use Topics  . Smoking status: Never Smoker   . Smokeless tobacco: Not on file  . Alcohol Use: No    Review of Systems  Unable to perform ROS: Mental status change    Allergies  Morphine and related; Prochlorperazine edisylate; and Compazine  Home Medications   Current Outpatient Rx  Name  Route  Sig  Dispense  Refill  . ALPRAZolam (XANAX) 0.5 MG tablet   Oral   Take 0.5 mg by mouth 3 (three)  times daily as needed for anxiety.         Marland Kitchen aspirin (BAYER LOW STRENGTH) 81 MG EC tablet   Oral   Take 81 mg by mouth daily.           . budesonide-formoterol (SYMBICORT) 160-4.5 MCG/ACT inhaler   Inhalation   Inhale 2 puffs into the lungs 2 (two) times daily.           . ciprofloxacin (CIPRO) 500 MG tablet   Oral   Take 1 tablet (500 mg total) by mouth 2 (two) times daily.   9 tablet   0   . enalapril (VASOTEC) 5 MG tablet   Oral   Take 5 mg by mouth daily.           Marland Kitchen esomeprazole (NEXIUM) 40 MG capsule   Oral   Take 40 mg by mouth every morning.         . furosemide (LASIX) 20 MG tablet   Oral   Take 20 mg by mouth daily. Started on 04/12/2013 for 7 days         . insulin aspart (NOVOLOG) 100 UNIT/ML injection   Subcutaneous   Inject 10-14 Units into the skin daily as needed (90-150= 10 units 151-200= 11 units 201-250= 12 units 250-300= 13 units 301-350= 14 units as directed per sliding scale instructions). Per sliding scale. Patient injects 10-40 units for levels above 150 to 300         . insulin detemir (LEVEMIR) 100 UNIT/ML injection   Subcutaneous   Inject 60 Units into the skin at bedtime.          Marland Kitchen lactulose (CHRONULAC) 10 GM/15ML solution   Oral   Take 60 mLs (40 g total) by mouth 3 (three) times daily.   5400 mL   3     1 month supply   . meclizine (ANTIVERT) 25 MG tablet   Oral   Take 25 mg by mouth 3 (three) times daily as needed for dizziness or nausea.          . Menthol, Topical Analgesic, (BIOFREEZE) 4 % GEL   Apply externally   Apply 1 application topically daily as needed (for arm pain).         . Multiple Vitamins-Minerals (CENTRUM SILVER PO)   Oral   Take 1 tablet by mouth daily.          Marland Kitchen oxyCODONE (OXY IR/ROXICODONE) 5 MG immediate release tablet   Oral   Take 1 tablet (5 mg total) by mouth every 4 (four) hours as needed.   30 tablet  0   . pregabalin (LYRICA) 100 MG capsule   Oral   Take 100-200 mg by  mouth daily.          . quinapril (ACCUPRIL) 5 MG tablet   Oral   Take 5 mg by mouth daily.         Marland Kitchen triamcinolone cream (KENALOG) 0.1 %   Topical   Apply 1 application topically daily as needed (for irritation).           BP 112/56  Pulse 70  Temp(Src) 97.7 F (36.5 C) (Oral)  Resp 18  Ht 5\' 8"  (1.727 m)  Wt 229 lb (103.874 kg)  BMI 34.83 kg/m2  SpO2 99% Physical Exam 1315: Physical examination:  Nursing notes reviewed; Vital signs and O2 SAT reviewed;  Constitutional: Well developed, Well nourished, In no acute distress; Head:  Normocephalic, atraumatic; Eyes: EOMI, PERRL, No scleral icterus; ENMT: Mouth and pharynx normal, Mucous membranes dry; Neck: Supple, Full range of motion, No lymphadenopathy; Cardiovascular: Regular rate and rhythm, No gallop; Respiratory: Breath sounds clear & equal bilaterally, No rales, rhonchi, wheezes.  Speaking full sentences with ease, Normal respiratory effort/excursion; Chest: Nontender, Movement normal; Abdomen: Soft, Nontender, Nondistended, Normal bowel sounds; Genitourinary: No CVA tenderness; Extremities: Pulses normal, No deformity. No tenderness, No edema, No calf edema or asymmetry.; Neuro: Awake, alert, confused re: time, events. Major CN grossly intact. No facial droop Speech clear. Moves all extremities spontaneously and to command without apparent gross focal motor deficits.; Skin: Color normal, Warm, Dry.   ED Course  Procedures   EKG Interpretation     Ventricular Rate:  70 PR Interval:  194 QRS Duration: 104 QT Interval:  436 QTC Calculation: 470 R Axis:   -54 Text Interpretation:  Normal sinus rhythm Left anterior fascicular block Nonspecific T wave abnormality Prolonged QT When compared with ECG of 29-Jul-2013 15:43, No significant change was found            MDM  MDM Reviewed: previous chart, nursing note and vitals Reviewed previous: labs and ECG Interpretation: labs, ECG and x-ray   Results for orders  placed during the hospital encounter of 08/30/13  CBC WITH DIFFERENTIAL      Result Value Range   WBC 4.2  4.0 - 10.5 K/uL   RBC 4.87  3.87 - 5.11 MIL/uL   Hemoglobin 14.7  12.0 - 15.0 g/dL   HCT 13.0  86.5 - 78.4 %   MCV 85.0  78.0 - 100.0 fL   MCH 30.2  26.0 - 34.0 pg   MCHC 35.5  30.0 - 36.0 g/dL   RDW 69.6  29.5 - 28.4 %   Platelets 123 (*) 150 - 400 K/uL   Neutrophils Relative % 59  43 - 77 %   Neutro Abs 2.5  1.7 - 7.7 K/uL   Lymphocytes Relative 29  12 - 46 %   Lymphs Abs 1.2  0.7 - 4.0 K/uL   Monocytes Relative 8  3 - 12 %   Monocytes Absolute 0.4  0.1 - 1.0 K/uL   Eosinophils Relative 3  0 - 5 %   Eosinophils Absolute 0.1  0.0 - 0.7 K/uL   Basophils Relative 1  0 - 1 %   Basophils Absolute 0.0  0.0 - 0.1 K/uL  BASIC METABOLIC PANEL      Result Value Range   Sodium 137  135 - 145 mEq/L   Potassium 4.0  3.5 - 5.1 mEq/L   Chloride 104  96 -  112 mEq/L   CO2 23  19 - 32 mEq/L   Glucose, Bld 250 (*) 70 - 99 mg/dL   BUN 11  6 - 23 mg/dL   Creatinine, Ser 4.09  0.50 - 1.10 mg/dL   Calcium 9.5  8.4 - 81.1 mg/dL   GFR calc non Af Amer 86 (*) >90 mL/min   GFR calc Af Amer >90  >90 mL/min  HEPATIC FUNCTION PANEL      Result Value Range   Total Protein 6.4  6.0 - 8.3 g/dL   Albumin 3.4 (*) 3.5 - 5.2 g/dL   AST 24  0 - 37 U/L   ALT 12  0 - 35 U/L   Alkaline Phosphatase 127 (*) 39 - 117 U/L   Total Bilirubin 2.8 (*) 0.3 - 1.2 mg/dL   Bilirubin, Direct 0.5 (*) 0.0 - 0.3 mg/dL   Indirect Bilirubin 2.3 (*) 0.3 - 0.9 mg/dL  AMMONIA      Result Value Range   Ammonia 117 (*) 11 - 60 umol/L  PROTIME-INR      Result Value Range   Prothrombin Time 14.7  11.6 - 15.2 seconds   INR 1.17  0.00 - 1.49   Dg Chest 2 View 08/30/2013   CLINICAL DATA:  Weakness. Altered mental status.  EXAM: CHEST  2 VIEW  COMPARISON:  07/29/2013.  FINDINGS: Positioning is lordotic. Cardiomegaly appears stable. There is vascular congestion without overt pulmonary edema. There is no pleural effusion or  confluent airspace opacity. The osseous structures appear normal.  IMPRESSION: Stable cardiomegaly. No acute cardiopulmonary process.   Electronically Signed   By: Roxy Horseman M.D.   On: 08/30/2013 14:39   Results for ARACELIA, BRINSON (MRN [id number]) as of 08/30/2013 15:57  Ref. Range 04/16/2013 08:25 04/26/2013 15:39 07/29/2013 15:28 08/30/2013 13:00  Ammonia Latest Range: 11-60 umol/L 90 (H) 59 77 (H) 117 (H)     1545:  Ammonia level elevated. UA pending. Remains afebrile with stable VS. Dx and testing d/w pt and family.  Questions answered.  Verb understanding, agreeable to admit.  T/C to [facility] Dr. Irene Limbo, case discussed, including:  HPI, pertinent PM/SHx, VS/PE, dx testing, ED course and treatment:  Agreeable to admit, aware UA/UC pending, requests to write temporary orders, obtain medical bed to team 2.   Laray Anger, DO 09/01/13 1111

## 2013-08-31 DIAGNOSIS — K7460 Unspecified cirrhosis of liver: Secondary | ICD-10-CM

## 2013-08-31 DIAGNOSIS — E119 Type 2 diabetes mellitus without complications: Secondary | ICD-10-CM

## 2013-08-31 DIAGNOSIS — I10 Essential (primary) hypertension: Secondary | ICD-10-CM

## 2013-08-31 LAB — COMPREHENSIVE METABOLIC PANEL
ALT: 14 U/L (ref 0–35)
AST: 25 U/L (ref 0–37)
Alkaline Phosphatase: 121 U/L — ABNORMAL HIGH (ref 39–117)
BUN: 10 mg/dL (ref 6–23)
CO2: 24 mEq/L (ref 19–32)
Calcium: 9.3 mg/dL (ref 8.4–10.5)
Chloride: 108 mEq/L (ref 96–112)
Creatinine, Ser: 0.64 mg/dL (ref 0.50–1.10)
GFR calc Af Amer: >90 mL/min (ref >90)
GFR calc non Af Amer: 88 mL/min — ABNORMAL LOW (ref >90)
Glucose, Bld: 282 mg/dL — ABNORMAL HIGH (ref 70–99)
Potassium: 3.7 mEq/L (ref 3.5–5.1)
Sodium: 140 mEq/L (ref 135–145)
Total Bilirubin: 2.2 mg/dL — ABNORMAL HIGH (ref 0.3–1.2)
Total Protein: 5.7 g/dL — ABNORMAL LOW (ref 6.0–8.3)

## 2013-08-31 LAB — GLUCOSE, CAPILLARY
Glucose-Capillary: 260 mg/dL — ABNORMAL HIGH (ref 70–99)
Glucose-Capillary: 296 mg/dL — ABNORMAL HIGH (ref 70–99)
Glucose-Capillary: 299 mg/dL — ABNORMAL HIGH (ref 70–99)

## 2013-08-31 LAB — HEMOGLOBIN A1C
Hgb A1c MFr Bld: 6.2 % — ABNORMAL HIGH (ref <5.7)
Mean Plasma Glucose: 131 mg/dL — ABNORMAL HIGH (ref <117)

## 2013-08-31 LAB — AMMONIA: Ammonia: 66 umol/L — ABNORMAL HIGH (ref 11–60)

## 2013-08-31 MED ORDER — INSULIN ASPART 100 UNIT/ML ~~LOC~~ SOLN
0.0000 [IU] | Freq: Three times a day (TID) | SUBCUTANEOUS | Status: DC
  Administered 2013-09-01: 4 [IU] via SUBCUTANEOUS
  Administered 2013-09-01: 7 [IU] via SUBCUTANEOUS

## 2013-08-31 MED ORDER — INSULIN DETEMIR 100 UNIT/ML ~~LOC~~ SOLN
40.0000 [IU] | Freq: Every day | SUBCUTANEOUS | Status: DC
  Filled 2013-08-31: qty 0.4

## 2013-08-31 MED ORDER — INSULIN DETEMIR 100 UNIT/ML ~~LOC~~ SOLN
60.0000 [IU] | Freq: Every day | SUBCUTANEOUS | Status: DC
  Administered 2013-08-31: 60 [IU] via SUBCUTANEOUS
  Filled 2013-08-31 (×2): qty 0.6

## 2013-08-31 MED ORDER — LACTULOSE 10 GM/15ML PO SOLN
40.0000 g | Freq: Four times a day (QID) | ORAL | Status: DC
  Administered 2013-09-01 (×2): 40 g via ORAL
  Filled 2013-08-31 (×3): qty 60

## 2013-08-31 NOTE — Care Management Note (Signed)
    Page 1 of 1   09/01/2013     2:05:51 PM   CARE MANAGEMENT NOTE 09/01/2013  Patient:  [name] [name], [name] [name]   Account Number:  [id number]  Date Initiated:  08/31/2013  Documentation initiated by:  Rosemary Holms  Subjective/Objective Assessment:   Pt from home where she lives with his husband. Currently has HH PT with [facility]. Will resume PT when DC'd. [facility] notified of admission     Action/Plan:   Anticipated DC Date:  09/02/2013   Anticipated DC Plan:  HOME W HOME HEALTH SERVICES      DC Planning Services  CM consult      Choice offered to / List presented to:          [facility] arranged  HH-2 PT      [facility] agency  [facility]   Status of service:  Completed, signed off Medicare Important Message given?  NA - LOS <3 / Initial given by admissions (If response is "NO", the following Medicare IM given date fields will be blank) Date Medicare IM given:   Date Additional Medicare IM given:    Discharge Disposition:  HOME W HOME HEALTH SERVICES  Per UR Regulation:    If discussed at Long Length of Stay Meetings, dates discussed:    Comments:  08/31/13 Rosemary Holms RN BSN CM

## 2013-08-31 NOTE — Progress Notes (Signed)
UR Chart Review Completed  

## 2013-08-31 NOTE — Progress Notes (Signed)
[facility] PROGRESS NOTE  [name] [name] ZOX:[id number] DOB: May 06, 1941 DOA: 08/30/2013 PCP: Colette Ribas, MD  Assessment/Plan: #1 acute encephalopathy Likely secondary to hepatic encephalopathy. Patient on presentation had elevated ammonia level. Patient with clinical improvement. Patient is alert and oriented x3. Xanax on hold. Continue lactulose. Follow.  #2 hepatic encephalopathy Clinical improvement. Ammonia levels trending down. Decreased lactulose dose to every 6 hours. Follow.  #3 recent UTI Patient was treated with 2 weeks of antibiotics. No further antibiotics needed at this time.  #4 generalized weakness Likely secondary to problem #1 and prior UTI superimposed on chronic weakness. PT OT follow. Follow.  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 No evidence of bleeding. No significant decompensation.  #6 type 2 diabetes Increase levemir back to home dose. Place on sliding scale insulin.   Code Status: Full Family Communication: Updated patient and husband at bedside. Disposition Plan: Home on medically stable at one to 2 days.   Consultants:  None  Procedures:  Chest x-ray 08/30/2013  Antibiotics:  None  HPI/Subjective: Patient alert. Patient following commands. Patient with no complaints.  Objective: Filed Vitals:   08/31/13 1459  BP: 130/66  Pulse: 88  Temp: 97.6 F (36.4 C)  Resp: 18    Intake/Output Summary (Last 24 hours) at 08/31/13 1604 Last data filed at 08/31/13 0900  Gross per 24 hour  Intake    240 ml  Output      0 ml  Net    240 ml   Filed Weights   08/30/13 1235 08/30/13 1729  Weight: 103.874 kg (229 lb) 107.4 kg (236 lb 12.4 oz)    Exam:   General:  NAD  Cardiovascular: RRR  Respiratory: CTAB  Abdomen: Soft/NT/ND/+BS  Musculoskeletal: No c/c/e  Data Reviewed: Basic Metabolic Panel:  Recent Labs Lab 08/30/13 1300 08/31/13 0513  NA 137 140  K 4.0 3.7  CL 104 108  CO2  23 24  GLUCOSE 250* 282*  BUN 11 10  CREATININE 0.69 0.64  CALCIUM 9.5 9.3   Liver Function Tests:  Recent Labs Lab 08/30/13 1300 08/31/13 0513  AST 24 25  ALT 12 14  ALKPHOS 127* 121*  BILITOT 2.8* 2.2*  PROT 6.4 5.7*  ALBUMIN 3.4* 2.9*   No results found for this basename: LIPASE, AMYLASE,  in the last 168 hours  Recent Labs Lab 08/30/13 1300 08/31/13 0507  AMMONIA 117* 66*   CBC:  Recent Labs Lab 08/30/13 1300  WBC 4.2  NEUTROABS 2.5  HGB 14.7  HCT 41.4  MCV 85.0  PLT 123*   Cardiac Enzymes: No results found for this basename: CKTOTAL, CKMB, CKMBINDEX, TROPONINI,  in the last 168 hours BNP (last 3 results) No results found for this basename: PROBNP,  in the last 8760 hours CBG:  Recent Labs Lab 08/30/13 1632 08/30/13 2025 08/31/13 0738 08/31/13 1149  GLUCAP 253* 306* 260* 296*    Recent Results (from the past 240 hour(s))  URINE CULTURE     Status: None   Collection Time    08/30/13  3:46 PM      Result Value Range Status   Specimen Description URINE, CATHETERIZED   Final   Special Requests NONE   Final   Culture  Setup Time     Final   Value: 08/30/2013 22:05     Performed at [facility] Count     Final   Value: >=100,000 COLONIES/ML     Performed at [facility]  Culture     Final   Value: ESCHERICHIA COLI     Performed at [facility]   Report Status PENDING   Incomplete     Studies: Dg Chest 2 View  08/30/2013   CLINICAL DATA:  Weakness. Altered mental status.  EXAM: CHEST  2 VIEW  COMPARISON:  07/29/2013.  FINDINGS: Positioning is lordotic. Cardiomegaly appears stable. There is vascular congestion without overt pulmonary edema. There is no pleural effusion or confluent airspace opacity. The osseous structures appear normal.  IMPRESSION: Stable cardiomegaly. No acute cardiopulmonary process.   Electronically Signed   By: Roxy Horseman M.D.   On: 08/30/2013 14:39    Scheduled Meds: . aspirin EC  81  mg Oral Daily  . budesonide-formoterol  2 puff Inhalation BID  . enoxaparin (LOVENOX) injection  40 mg Subcutaneous Q24H  . insulin detemir  30 Units Subcutaneous QHS  . lactulose  40 g Oral Q4H  . lisinopril  5 mg Oral Daily  . pantoprazole  40 mg Oral Daily  . sodium chloride  3 mL Intravenous Q12H   Continuous Infusions:   Principal Problem:   Acute encephalopathy Active Problems:   DIABETES, TYPE 2   THROMBOCYTOPENIA, CHRONIC   CIRRHOSIS   Generalized weakness   Liver cirrhosis secondary to NASH   Encephalopathy, hepatic    Time spent: > 30 mins    [facility]  [facility] Pager [phone]. If 7PM-7AM, please contact night-coverage at [email or web address], password [facility] 08/31/2013, 4:04 PM  LOS: 1 day

## 2013-08-31 NOTE — Progress Notes (Signed)
Inpatient Diabetes Program Recommendations  AACE/ADA: New Consensus Statement on Inpatient Glycemic Control (2013)  Target Ranges:  Prepandial:   less than 140 mg/dL      Peak postprandial:   less than 180 mg/dL (1-2 hours)      Critically ill patients:  140 - 180 mg/dL  Results for KITA, NEACE (MRN [id number]) as of 08/31/2013 10:18  Ref. Range 02/14/2013 12:00  Hemoglobin A1C Latest Range: <5.7 % 8.1 (H)   Results for LASHIA, NIESE (MRN [id number]) as of 08/31/2013 10:18  Ref. Range 08/30/2013 16:32 08/30/2013 20:25 08/31/2013 07:38  Glucose-Capillary Latest Range: 70-99 mg/dL 469 (H) 629 (H) 528 (H)    Inpatient Diabetes Program Recommendations Insulin - Basal: Please consider increasing Levemir to 45 units QHS. Correction (SSI): Please order CBGs ACHS with Novolog resistance correction scale. HgbA1C: Please consider ordering an A1C to determine glycemic control over the past 2-3 months.  Note: Patient has a history of diabetes and takes Levemir 60 units QHS and Novolog 10-14 units as needed for blood glucose control as an outpatient for diabetes management.  Currently, patient is ordered to receive Levemir 30 units QHS for inpatient glycemic control.  There is not an order for CBGs or for Novolog correction.  Fasting glucose noted to be 260 mg/dl this morning.  Please order CBGs with Novolog resistant correction ACHS.  Also, please consider increasing Levemir to 45 units QHS and order an A1C.  Will continue to follow.  Thanks, Orlando Penner, RN, MSN, CCRN Diabetes Coordinator Inpatient Diabetes Program [phone] (Team Pager) [phone] ([facility] office) [phone] [facility] office)

## 2013-08-31 NOTE — Evaluation (Signed)
Physical Therapy Evaluation Patient Details Name: [name] [name] MRN: [id number] DOB: 02-Aug-1941 Today's Date: 08/31/2013 Time: 4098-1191 PT Time Calculation (min): 56 min  PT Assessment / Plan / Recommendation History of Present Illness   Pt is a 72 yo female who fell and fx her Rt arm in April of this year.  She went to [facility] for four months and was receiving HH at home.  She states she still was not able to ambulate she has just been working on transfers with her husband and therapist assist.   Clinical Impression  Pt is able to transfer with min assist.  She will need continued skilled PT to increase her activity level and safety.                             PT Assessment  Patient needs continued PT services    Follow Up Recommendations  Home health PT    Does the patient have the potential to tolerate intense rehabilitation    no  Barriers to Discharge        Equipment Recommendations  None recommended by PT    Recommendations for Other Services   OT  Frequency Min 3X/week    Precautions / Restrictions Precautions Precautions: Fall Precaution Comments: Pt has complained of being dizzy for months Restrictions Weight Bearing Restrictions: No   Pertinent Vitals/Pain Arm 9/10 but there is not facial pain and pt is able to carry on a conversation with no difficulties.      Mobility  Bed Mobility Bed Mobility: Supine to Sit;Sitting - Scoot to [employer] of Bed;Sit to Supine Supine to Sit: 4: Min assist Sitting - Scoot to [place] of Bed: 4: Min guard Sit to Supine: 4: Min guard Details for Bed Mobility Assistance: sit to stand done five times standing as long as possible (10-15 seconds) Transfers Transfers: Sit to [employer] Sit to Stand: 4: Min assist Stand Pivot Transfers: 4: Min assist Ambulation/Gait Ambulation/Gait Assistance: Not tested (comment)    Exercises General Exercises - Lower Extremity Ankle Circles/Pumps: 10 reps;Both Quad Sets: 10  reps;Both Gluteal Sets: 10 reps (bridge) Hip ABduction/ADduction: Both;10 reps   PT Diagnosis: Difficulty walking;Generalized weakness  PT Problem List: Decreased strength;Decreased activity tolerance;Decreased balance;Decreased mobility PT Treatment Interventions: Gait training;Therapeutic activities;Therapeutic exercise     PT Goals(Current goals can be found in the care plan section) Acute Rehab PT Goals PT Goal Formulation: With patient Time For Goal Achievement: 09/02/13  Visit Information  Last PT Received On: 08/31/13 Assistance Needed: +1       Prior Functioning  Home Living Family/patient expects to be discharged to:: Private residence Living Arrangements: Spouse/significant other Available Help at Discharge: Family;Available 24 hours/day Type of Home: House Home Access: Stairs to enter [employer] of Steps: 3 Entrance Stairs-Rails: Right Home Layout: One level Home Equipment: Walker - standard;Wheelchair - [employer];Shower seat;Cane - single point    Cognition  Cognition Overall Cognitive Status: Within Functional Limits for tasks assessed    Extremity/Trunk Assessment Lower Extremity Assessment Lower Extremity Assessment: Generalized weakness   Balance  decreased  End of Session PT - End of Session Equipment Utilized During Treatment: Gait belt Activity Tolerance: Patient tolerated treatment well Patient left: in bed;with call bell/phone within reach  GP     RUSSELL,CINDY 08/31/2013, 9:46 AM

## 2013-08-31 NOTE — Progress Notes (Signed)
Patient's CBG are greater than 200,currently not on [employer] notified.Will continue to monitor patient.

## 2013-09-01 DIAGNOSIS — N390 Urinary tract infection, site not specified: Secondary | ICD-10-CM

## 2013-09-01 LAB — BASIC METABOLIC PANEL
BUN: 12 mg/dL (ref 6–23)
CO2: 23 mEq/L (ref 19–32)
Calcium: 9 mg/dL (ref 8.4–10.5)
Chloride: 108 mEq/L (ref 96–112)
Creatinine, Ser: 0.61 mg/dL (ref 0.50–1.10)
GFR calc non Af Amer: 90 mL/min — ABNORMAL LOW (ref >90)

## 2013-09-01 LAB — CBC
HCT: 36.7 % (ref 36.0–46.0)
MCH: 30.1 pg (ref 26.0–34.0)
MCHC: 35.4 g/dL (ref 30.0–36.0)
MCV: 85 fL (ref 78.0–100.0)
Platelets: 115 10*3/uL — ABNORMAL LOW (ref 150–400)
RDW: 14.2 % (ref 11.5–15.5)
WBC: 4.7 10*3/uL (ref 4.0–10.5)

## 2013-09-01 LAB — GLUCOSE, CAPILLARY: Glucose-Capillary: 249 mg/dL — ABNORMAL HIGH (ref 70–99)

## 2013-09-01 MED ORDER — AMOXICILLIN-POT CLAVULANATE 875-125 MG PO TABS: 1.0000 | ORAL_TABLET | Freq: Two times a day (BID) | ORAL | Status: DC

## 2013-09-01 MED ORDER — AMOXICILLIN-POT CLAVULANATE 875-125 MG PO TABS
1.0000 | ORAL_TABLET | Freq: Two times a day (BID) | ORAL | Status: DC
  Administered 2013-09-01: 1 via ORAL
  Filled 2013-09-01: qty 1

## 2013-09-01 NOTE — Progress Notes (Signed)
Physical Therapy Treatment Patient Details Name: [name] [name] MRN: [id number] DOB: Apr 08, 1941 Today's Date: 09/01/2013 Time: 4098-1191 PT Time Calculation (min): 44 min 1 GT 1 THEREX  PT Assessment / Plan / Recommendation     Patient much improved with all mobility today as compared to yesterday. Patient was able to use RW with Mod A to amb a total of 30' (20' + 10') Patient appears to put more weight into LLE possibly reason for RLE crossing left several times which caused pt to lose balance and be unsteady even with RW at times. Patient needs assistance at all times with ambulation. All therex completed with good technique and without fatigue.                        Progress towards PT Goals Progress towards PT goals: Progressing toward goals  Plan      Precautions / Restrictions         Mobility  Bed Mobility Supine to Sit: 6: Modified independent (Device/Increase time);HOB flat Sitting - Scoot to Edge of Bed: 6: Modified independent (Device/Increase time) Transfers Transfers: Sit to Stand;Stand to Sit Sit to Stand: 5: Supervision;From bed;From chair/3-in-1;With upper extremity assist Stand to Sit: To chair/3-in-1;To bed;5: Supervision;With armrests Ambulation/Gait Ambulation/Gait Assistance: 3: Mod assist Ambulation Distance (Feet): 30 Feet (20' + 10';rest between) Assistive device: Rolling walker Gait Pattern: Narrow base of support;Decreased stride length;Decreased hip/knee flexion - right;Decreased hip/knee flexion - left Gait velocity: slow General Gait Details: very unsteady at times;increased weight shifting to LLE with RLE crossing left at times which resulted in multiple LOB and Mod A Stairs: No Wheelchair Mobility Wheelchair Mobility: No    Exercises General Exercises - Lower Extremity Ankle Circles/Pumps: AROM;Both;10 reps;Supine Long Arc Quad: AROM;Both;10 reps;Seated Heel Slides: AROM;Both;Supine;10 reps Hip ABduction/ADduction: AROM;Both;10  reps;Seated Straight Leg Raises: AROM;Both;Supine;10 reps Hip Flexion/Marching: AROM;Seated;Both;10 reps Other Exercises Other Exercises: STS x5   PT Diagnosis:    PT Problem List:   PT Treatment Interventions:     PT Goals (current goals can now be found in the care plan section)    Visit Information  Last PT Received On: 09/01/13 Assistance Needed: +1    Subjective Data   I am hoping to go home today and return to home health                  GP     [name] [name] 09/01/2013, 9:57 AM

## 2013-09-01 NOTE — Discharge Summary (Signed)
Physician Discharge Summary  [name] [name] YNW:[id number] DOB: Oct 13, 1941 DOA: 08/30/2013  PCP: Colette Ribas, MD  Admit date: 08/30/2013 Discharge date: 09/01/2013  Recommendations for Outpatient Follow-up:  1. Followup hepatic encephalopathy 2. Followup resolution of UTI 3. Home health physical therapy  Follow-up Information   Follow up with Colette Ribas, MD In 1 week.   Specialty:  Family Medicine   Contact information:   [address] [place] [address] [phone]      Discharge Diagnoses:  1. Acute encephalopathy 2. Hepatic encephalopathy 3. Hyperammonemia 4. E coli UTI 5. Generalized weakness 6. Cirrhosis secondary to NASH 7. Diabetes mellitus type 2  Discharge Condition: Improved Disposition: Home with physical therapy  Diet recommendation:  Heart healthy  Filed Weights   08/30/13 1235 08/30/13 1729  Weight: 103.874 kg (229 lb) 107.4 kg (236 lb 12.4 oz)    History of present illness:  72 year old woman with history of NASH cirrhosis presented to the emergency department with altered mental status for one week, generalized weakness and sleepiness.  Hospital Course:  Ms. Kawa was admitted for further evaluation generalized weakness and encephalopathy. Her initial presentation, history and clinical findings were highly suggestive of hepatic encephalopathy. She rapidly improved with increased lactulose and has returned to baseline. Of note she had been treated with Keflex for 2 weeks for UTI prior to admission. Although her urinalysis was equivocal, urine culture did grow E coli, curiously sensitive to Keflex. She was evaluated by physical therapy and is stable for discharge.  1. Acute encephalopathy: Resolved. Secondary to hepatic encephalopathy. Continue lactulose. 2. Hepatic encephalopathy, hyperammonemia: Resolved. Ammonia level trending downwards. 3. E. coli UTI: Sensitive to ceftriaxone, Zosyn, Bactrim. Doubt  contributory to mental status given patient's clinical improvement without treatment as far. 4. Generalized weakness: Likely secondary to acute illness. Physical therapy recommendations appreciated. 5. Cirrhosis secondary to NASH: Stable. 6. Diabetes mellitus type 2: Stable.  Consultants:  Physical therapy: Home health PT Procedures:  None Antibiotics:  Augmentin 10/21 >> 10/23   Discharge Instructions  Discharge Orders   Future Orders Complete By Expires   Diet - low sodium heart healthy  As directed    Discharge instructions  As directed    Comments:     Call your physician or seek immediate medical attention for confusion, fever or worsening of condition. For questions in regard to activity, be sure to seek advice from your orthopedic surgeon.       Medication List    STOP taking these medications       cephALEXin 250 MG capsule  Commonly known as:  KEFLEX      TAKE these medications       ALPRAZolam 0.5 MG tablet  Commonly known as:  XANAX  Take 0.5 mg by mouth 3 (three) times daily as needed for anxiety.     amoxicillin-clavulanate 875-125 MG per tablet  Commonly known as:  AUGMENTIN  Take 1 tablet by mouth every 12 (twelve) hours.     BAYER LOW STRENGTH 81 MG EC tablet  Generic drug:  aspirin  Take 81 mg by mouth daily.     BIOFREEZE 4 % Gel  Generic drug:  Menthol (Topical Analgesic)  Apply 1 application topically daily as needed (for arm pain).     budesonide-formoterol 160-4.5 MCG/ACT inhaler  Commonly known as:  SYMBICORT  Inhale 2 puffs into the lungs 2 (two) times daily.     CENTRUM SILVER PO  Take 1 tablet by  mouth daily.     enalapril 5 MG tablet  Commonly known as:  VASOTEC  Take 5 mg by mouth daily.     esomeprazole 40 MG capsule  Commonly known as:  NEXIUM  Take 40 mg by mouth every morning.     ESTRACE VAGINAL 0.1 MG/GM vaginal cream  Generic drug:  estradiol  Place vaginally every other day. At  bedtime     insulin aspart 100  UNIT/ML injection  Commonly known as:  novoLOG  Inject 10-14 Units into the skin daily as needed (90-150= 10 units 151-200= 11 units 201-250= 12 units 250-300= 13 units 301-350= 14 units as directed per sliding scale instructions). Per sliding scale. Patient injects 10-40 units for levels above 150 to 300     insulin detemir 100 UNIT/ML injection  Commonly known as:  LEVEMIR  Inject 60 Units into the skin at bedtime.     lactulose 10 GM/15ML solution  Commonly known as:  CHRONULAC  Take 60 mLs (40 g total) by mouth 3 (three) times daily.     meclizine 25 MG tablet  Commonly known as:  ANTIVERT  Take 25 mg by mouth 3 (three) times daily as needed for dizziness or nausea.     pregabalin 100 MG capsule  Commonly known as:  LYRICA  Take 100-200 mg by mouth daily.     quinapril 5 MG tablet  Commonly known as:  ACCUPRIL  Take 5 mg by mouth daily.     triamcinolone cream 0.1 %  Commonly known as:  KENALOG  Apply 1 application topically daily as needed (for irritation).     ZOSTAVAX 16109 UNT/0.65ML injection  Generic drug:  zoster vaccine live (PF)  once.       Allergies  Allergen Reactions  . Morphine And Related Itching  . Prochlorperazine Edisylate Other (See Comments)    Causes anxiety/numbness  . Compazine [Prochlorperazine Edisylate] Anxiety    Causes anxiety & nervousness    The results of significant diagnostics from this hospitalization (including imaging, microbiology, ancillary and laboratory) are listed below for reference.    Significant Diagnostic Studies: Dg Chest 2 View  08/30/2013   CLINICAL DATA:  Weakness. Altered mental status.  EXAM: CHEST  2 VIEW  COMPARISON:  07/29/2013.  FINDINGS: Positioning is lordotic. Cardiomegaly appears stable. There is vascular congestion without overt pulmonary edema. There is no pleural effusion or confluent airspace opacity. The osseous structures appear normal.  IMPRESSION: Stable cardiomegaly. No acute cardiopulmonary  process.   Electronically Signed   By: Roxy Horseman M.D.   On: 08/30/2013 14:39   Microbiology: Recent Results (from the past 240 hour(s))  URINE CULTURE     Status: None   Collection Time    08/30/13  3:46 PM      Result Value Range Status   Specimen Description URINE, CATHETERIZED   Final   Special Requests NONE   Final   Culture  Setup Time     Final   Value: 08/30/2013 22:05     Performed at [facility] Count     Final   Value: >=100,000 COLONIES/ML     Performed at [facility]   Culture     Final   Value: ESCHERICHIA COLI     GRAM POSITIVE COCCI     Performed at [facility]   Report Status PENDING   Incomplete   Organism ID, Bacteria ESCHERICHIA COLI   Final     Labs: Basic  Metabolic Panel:  Recent Labs Lab 08/30/13 1300 08/31/13 0513 09/01/13 0608  NA 137 140 141  K 4.0 3.7 3.4*  CL 104 108 108  CO2 23 24 23   GLUCOSE 250* 282* 169*  BUN 11 10 12   CREATININE 0.69 0.64 0.61  CALCIUM 9.5 9.3 9.0   Liver Function Tests:  Recent Labs Lab 08/30/13 1300 08/31/13 0513  AST 24 25  ALT 12 14  ALKPHOS 127* 121*  BILITOT 2.8* 2.2*  PROT 6.4 5.7*  ALBUMIN 3.4* 2.9*    Recent Labs Lab 08/30/13 1300 08/31/13 0507  AMMONIA 117* 66*   CBC:  Recent Labs Lab 08/30/13 1300 09/01/13 0608  WBC 4.2 4.7  NEUTROABS 2.5  --   HGB 14.7 13.0  HCT 41.4 36.7  MCV 85.0 85.0  PLT 123* 115*   CBG:  Recent Labs Lab 08/31/13 1149 08/31/13 1651 08/31/13 2047 09/01/13 0725 09/01/13 1135  GLUCAP 296* 299* 257* 173* 249*    Principal Problem:   Acute encephalopathy Active Problems:   DIABETES, TYPE 2   THROMBOCYTOPENIA, CHRONIC   CIRRHOSIS   Generalized weakness   Liver cirrhosis secondary to NASH   Encephalopathy, hepatic   Time coordinating discharge: 25 minutes  Signed:  Brendia Sacks, MD [facility] 09/01/2013, 12:44 PM

## 2013-09-01 NOTE — Progress Notes (Signed)
[facility] PROGRESS NOTE  [name] [name] ZOX:[id number] DOB: [birth year] DOA: 08/30/2013 PCP: Colette Ribas, MD  Assessment/Plan: 1. Acute encephalopathy: Resolved. Secondary to hepatic encephalopathy. Continue lactulose. 2. Hepatic encephalopathy, hyperammonemia: Resolved. Ammonia level trending downwards. 3. E. coli UTI: Sensitive to ceftriaxone, Zosyn, Bactrim. Doubt contributory to mental status given patient's clinical improvement without treatment as far. 4. Generalized weakness: Likely secondary to acute illness. Physical therapy recommendations appreciated. 5. Cirrhosis secondary to NASH: Stable. 6. Diabetes mellitus type 2: Stable.   Three-day treatment with Augmentin for UTI  Discharge home today with home health, activity as per Dr. Carola Frost  Pending studies:   Urine culture  Disposition Plan: Home with home health physical therapy  Brendia Sacks, MD  [facility]  Pager [phone] If 7PM-7AM, please contact night-coverage at [email or web address], password [facility] 09/01/2013, 11:08 AM  LOS: 2 days   Summary: 72 year old woman with history of NASH cirrhosis presented to the emergency department with altered mental status for one week, generalized weakness and sleepiness.  Consultants:  Physical therapy: Home health PT  Procedures:  None  Antibiotics:  Augmentin 10/21 >> 10/23   HPI/Subjective: Much better today. Confusion has resolved per patient and husband. Issues.  Objective: Filed Vitals:   08/31/13 2049 08/31/13 2256 09/01/13 0614 09/01/13 0747  BP: 140/60  137/64   Pulse: 85  77   Temp: 97.8 F (36.6 C)  97.9 F (36.6 C)   TempSrc: Oral  Oral   Resp: 18  18   Height:      Weight:      SpO2: 99% 93% 99% 98%    Intake/Output Summary (Last 24 hours) at 09/01/13 1108 Last data filed at 08/31/13 2311  Gross per 24 hour  Intake 2607.5 ml  Output    150 ml  Net 2457.5 ml     Filed Weights   08/30/13 1235 08/30/13 1729  Weight:  103.874 kg (229 lb) 107.4 kg (236 lb 12.4 oz)    Exam:   Afebrile, vital signs stable  General: Appears calm and comfortable. Speech fluent and clear.  Psychiatric: Grossly normal mood and affect. Alert, oriented to self, month, day, year, location.  Cardiovascular: Regular rate and rhythm. No murmur, rub, gallop. No lower extremity edema.  Respiratory: clear to auscultation bilaterally. No wheezes, rales or rhonchi. Normal respiratory effort.  Abdomen: Soft, nontender, nondistended  Data Reviewed:  Basic metabolic panel unremarkable  CBC stable thrombocytopenia   Urine culture noted  Scheduled Meds: . aspirin EC  81 mg Oral Daily  . budesonide-formoterol  2 puff Inhalation BID  . enoxaparin (LOVENOX) injection  40 mg Subcutaneous Q24H  . insulin aspart  0-20 Units Subcutaneous TID WC  . insulin detemir  60 Units Subcutaneous QHS  . lactulose  40 g Oral Q6H  . lisinopril  5 mg Oral Daily  . pantoprazole  40 mg Oral Daily  . sodium chloride  3 mL Intravenous Q12H   Continuous Infusions:   Principal Problem:   Acute encephalopathy Active Problems:   DIABETES, TYPE 2   THROMBOCYTOPENIA, CHRONIC   CIRRHOSIS   Generalized weakness   Liver cirrhosis secondary to NASH   Encephalopathy, hepatic

## 2013-09-02 NOTE — Progress Notes (Addendum)
Discharge instructions where given on medications and follow up visits,patient and husband verbalized understanding.No c/o pain or discomfort noted.Accompanied by staff to an awaiting vehicle.

## 2013-09-04 LAB — URINE CULTURE

## 2013-09-18 ENCOUNTER — Emergency Department (HOSPITAL_COMMUNITY): Payer: Medicare Other

## 2013-09-18 ENCOUNTER — Emergency Department (HOSPITAL_COMMUNITY)
Admission: EM | Admit: 2013-09-18 | Discharge: 2013-09-18 | Disposition: A | Discharge status: Home / Self Care | Payer: Medicare Other | Attending: Emergency Medicine | Admitting: Emergency Medicine

## 2013-09-18 ENCOUNTER — Encounter (HOSPITAL_COMMUNITY): Payer: Self-pay | Admitting: Emergency Medicine

## 2013-09-18 DIAGNOSIS — S0993XA Unspecified injury of face, initial encounter: Secondary | ICD-10-CM | POA: Insufficient documentation

## 2013-09-18 DIAGNOSIS — S0101XA Laceration without foreign body of scalp, initial encounter: Secondary | ICD-10-CM

## 2013-09-18 DIAGNOSIS — Z860100 Personal history of colon polyps, unspecified: Secondary | ICD-10-CM | POA: Insufficient documentation

## 2013-09-18 DIAGNOSIS — Z9181 History of falling: Secondary | ICD-10-CM | POA: Insufficient documentation

## 2013-09-18 DIAGNOSIS — Z862 Personal history of diseases of the blood and blood-forming organs and certain disorders involving the immune mechanism: Secondary | ICD-10-CM | POA: Insufficient documentation

## 2013-09-18 DIAGNOSIS — Y9389 Activity, other specified: Secondary | ICD-10-CM | POA: Insufficient documentation

## 2013-09-18 DIAGNOSIS — E119 Type 2 diabetes mellitus without complications: Secondary | ICD-10-CM | POA: Insufficient documentation

## 2013-09-18 DIAGNOSIS — Z8659 Personal history of other mental and behavioral disorders: Secondary | ICD-10-CM | POA: Insufficient documentation

## 2013-09-18 DIAGNOSIS — Z9861 Coronary angioplasty status: Secondary | ICD-10-CM | POA: Insufficient documentation

## 2013-09-18 DIAGNOSIS — Z79899 Other long term (current) drug therapy: Secondary | ICD-10-CM | POA: Insufficient documentation

## 2013-09-18 DIAGNOSIS — K208 Other esophagitis without bleeding: Secondary | ICD-10-CM | POA: Insufficient documentation

## 2013-09-18 DIAGNOSIS — Z23 Encounter for immunization: Secondary | ICD-10-CM | POA: Insufficient documentation

## 2013-09-18 DIAGNOSIS — I10 Essential (primary) hypertension: Secondary | ICD-10-CM | POA: Insufficient documentation

## 2013-09-18 DIAGNOSIS — G609 Hereditary and idiopathic neuropathy, unspecified: Secondary | ICD-10-CM | POA: Insufficient documentation

## 2013-09-18 DIAGNOSIS — Z7982 Long term (current) use of aspirin: Secondary | ICD-10-CM | POA: Insufficient documentation

## 2013-09-18 DIAGNOSIS — K219 Gastro-esophageal reflux disease without esophagitis: Secondary | ICD-10-CM | POA: Insufficient documentation

## 2013-09-18 DIAGNOSIS — S0100XA Unspecified open wound of scalp, initial encounter: Secondary | ICD-10-CM | POA: Insufficient documentation

## 2013-09-18 DIAGNOSIS — I2510 Atherosclerotic heart disease of native coronary artery without angina pectoris: Secondary | ICD-10-CM | POA: Insufficient documentation

## 2013-09-18 DIAGNOSIS — W1809XA Striking against other object with subsequent fall, initial encounter: Secondary | ICD-10-CM | POA: Insufficient documentation

## 2013-09-18 DIAGNOSIS — Y929 Unspecified place or not applicable: Secondary | ICD-10-CM | POA: Insufficient documentation

## 2013-09-18 DIAGNOSIS — Z794 Long term (current) use of insulin: Secondary | ICD-10-CM | POA: Insufficient documentation

## 2013-09-18 DIAGNOSIS — E669 Obesity, unspecified: Secondary | ICD-10-CM | POA: Insufficient documentation

## 2013-09-18 DIAGNOSIS — Z8739 Personal history of other diseases of the musculoskeletal system and connective tissue: Secondary | ICD-10-CM | POA: Insufficient documentation

## 2013-09-18 DIAGNOSIS — E785 Hyperlipidemia, unspecified: Secondary | ICD-10-CM | POA: Insufficient documentation

## 2013-09-18 DIAGNOSIS — J45909 Unspecified asthma, uncomplicated: Secondary | ICD-10-CM | POA: Insufficient documentation

## 2013-09-18 LAB — GLUCOSE, CAPILLARY: Glucose-Capillary: 136 mg/dL — ABNORMAL HIGH (ref 70–99)

## 2013-09-18 MED ORDER — TETANUS-DIPHTH-ACELL PERTUSSIS 5-2.5-18.5 LF-MCG/0.5 IM SUSP
0.5000 mL | Freq: Once | INTRAMUSCULAR | Status: AC
  Administered 2013-09-18: 0.5 mL via INTRAMUSCULAR
  Filled 2013-09-18: qty 0.5

## 2013-09-18 NOTE — ED Notes (Addendum)
Fell when getting out of bed , struck head on dresser. ? Loc, [place] to scalp.  c-collar placed.  MAE. Awake, talking at triage

## 2013-09-18 NOTE — ED Notes (Signed)
C-collar removed from pt per EDP instruction

## 2013-09-18 NOTE — ED Notes (Signed)
MD at bedside. 

## 2013-09-18 NOTE — ED Provider Notes (Signed)
CSN: [id number]     Arrival date & time 09/18/13  1813 History  This chart was scribed for [name] Lennert, MD by Dorothey Baseman, ED Scribe. This patient was seen in room APA16A/APA16A and the patient's care was started at 8:13 PM.    Chief Complaint  Patient presents with  . Head Injury   Patient is a 72 y.o. female presenting with head injury. The history is provided by the patient. No language interpreter was used.  Head Injury Location:  L temporal Mechanism of injury: fall   Pain details:    Severity:  Mild   Timing:  Constant   Progression:  Unchanged Chronicity:  New Associated symptoms: neck pain   Associated symptoms: no headaches, no loss of consciousness and no seizures   Risk factors: being elderly    HPI Comments: [name] [name] is a 72 y.o. female who presents to the Emergency Department complaining of an injury to the head that occurred PTA when she states that she got out of bed and fell because it was dark and hit her head against the dresser. She reports associated neck pain. She denies syncope. Patient reports that she does not know when her last tetanus vaccination was. Patient reports a history of CAD, hyperlipidemia, HTN, and DM.   Past Medical History  Diagnosis Date  . CAD in native artery   . Thrombocytopenia, unspecified   . Unspecified hereditary and idiopathic peripheral neuropathy   . Unspecified fall   . Other and unspecified hyperlipidemia   . Unspecified essential hypertension   . Obesity, unspecified   . Shortness of breath   . Personal history of neurosis   . Intrinsic asthma, unspecified   . Allergic rhinitis, cause unspecified   . Cirrhosis of liver     NASH, afp on 06/17/12 =3.7, per pt she had hep B vaccines in 1996  . Colon adenomas 03/08/10    tcs by Dr. Jena Gauss  . Hyperplastic polyps of stomach 03/08/10    tcs by Dr. Geni Bers  . Chronic gastritis 03/09/11    egd by Dr. Jena Gauss  . History of hemorrhoids 03/08/10    tcs- internal and external  .  Diverticula of colon 03/08/10    L side  . Hiatal hernia 03/08/10  . Esophagitis, erosive 03/08/10  . GERD (gastroesophageal reflux disease)   . Wears glasses   . Type II or unspecified type diabetes mellitus without mention of complication, not stated as uncontrolled   . Vertigo   . Pancreatitis   . Bilateral leg weakness    Past Surgical History  Procedure Laterality Date  . Hysterectomy and btl      s/p  . Abdominal hysterectomy    . Tumor excision  2003    rt arm and left foot  . Colonoscopy  03/08/10    Dr. [name]-->ext/int hemorrhoids, anal paipilla, rectal polyps, desc polyps, cecal polyp, left-sided diverticula. suboptiomal prep. next TCS 02/2015. multiple adenomas  . Esophagogastroduodenoscopy  03/08/10    Dr. Dellie Catholic erosive RE, small hh, antral erosions, small 1cm are of mucosal indentation along gastric body of doubtful significance, cystic nodularity of hypophyarynx, base of tongue, base of epiglottis. benign gastric biopsies  . Tubal ligation    . Esophagogastroduodenoscopy  10/08/2011    Dr. Dionisio David esophageal varices, antral erosion. next egd 09/2013  . Foot surgery  2003    lt foot  . Eye surgery      cataracts bilateral  . Orif humerus fracture Right 02/17/2013  Procedure: OPEN REDUCTION INTERNAL FIXATION (ORIF) RIGHT PROXIMAL HUMERUS FRACTURE;  Surgeon: Budd Palmer, MD;  Location: [facility] OR;  Service: Orthopedics;  Laterality: Right;  . Partial gastrectomy      family denies  . Breast surgery    . Cardiac catheterization  02/25/2007    Dr. Clarene Duke:  normal LV systolic function, mild irregularities of LAD   Family History  Problem Relation Age of Onset  . Coronary artery disease      FH  . Diabetes      FH  . Heart disease Mother   . Colon cancer Neg Hx    History  Substance Use Topics  . Smoking status: Never Smoker   . Smokeless tobacco: Not on file  . Alcohol Use: No   OB History   Grav Para Term Preterm Abortions TAB SAB Ect Mult Living                  Review of Systems  Constitutional: Negative for appetite change and fatigue.  HENT: Negative for congestion, ear discharge and sinus pressure.   Eyes: Negative for discharge.  Respiratory: Negative for cough.   Cardiovascular: Negative for chest pain.  Gastrointestinal: Negative for abdominal pain and diarrhea.  Genitourinary: Negative for frequency and hematuria.  Musculoskeletal: Positive for neck pain. Negative for back pain.  Skin: Positive for wound. Negative for rash.  Neurological: Negative for seizures, loss of consciousness and headaches.  Psychiatric/Behavioral: Negative for hallucinations.    Allergies  Morphine and related; Prochlorperazine edisylate; and Compazine  Home Medications   Current Outpatient Rx  Name  Route  Sig  Dispense  Refill  . ALPRAZolam (XANAX) 0.5 MG tablet   Oral   Take 0.5 mg by mouth 3 (three) times daily as needed for anxiety.         Marland Kitchen amoxicillin-clavulanate (AUGMENTIN) 875-125 MG per tablet   Oral   Take 1 tablet by mouth every 12 (twelve) hours.   5 tablet   0   . aspirin (BAYER LOW STRENGTH) 81 MG EC tablet   Oral   Take 81 mg by mouth daily.           . budesonide-formoterol (SYMBICORT) 160-4.5 MCG/ACT inhaler   Inhalation   Inhale 2 puffs into the lungs 2 (two) times daily.           . enalapril (VASOTEC) 5 MG tablet   Oral   Take 5 mg by mouth daily.           Marland Kitchen esomeprazole (NEXIUM) 40 MG capsule   Oral   Take 40 mg by mouth every morning.         Marland Kitchen ESTRACE VAGINAL 0.1 MG/GM vaginal cream   Vaginal   Place vaginally every other day. At  bedtime         . insulin aspart (NOVOLOG) 100 UNIT/ML injection   Subcutaneous   Inject 10-14 Units into the skin daily as needed (90-150= 10 units 151-200= 11 units 201-250= 12 units 250-300= 13 units 301-350= 14 units as directed per sliding scale instructions). Per sliding scale. Patient injects 10-40 units for levels above 150 to 300         . insulin  detemir (LEVEMIR) 100 UNIT/ML injection   Subcutaneous   Inject 60 Units into the skin at bedtime.          Marland Kitchen lactulose (CHRONULAC) 10 GM/15ML solution   Oral   Take 60 mLs (40 g total) by mouth 3 (  three) times daily.   5400 mL   3     1 month supply   . meclizine (ANTIVERT) 25 MG tablet   Oral   Take 25 mg by mouth 3 (three) times daily as needed for dizziness or nausea.          . Menthol, Topical Analgesic, (BIOFREEZE) 4 % GEL   Apply externally   Apply 1 application topically daily as needed (for arm pain).         . Multiple Vitamins-Minerals (CENTRUM SILVER PO)   Oral   Take 1 tablet by mouth daily.          . pregabalin (LYRICA) 100 MG capsule   Oral   Take 100-200 mg by mouth daily.          . quinapril (ACCUPRIL) 5 MG tablet   Oral   Take 5 mg by mouth daily.         Marland Kitchen triamcinolone cream (KENALOG) 0.1 %   Topical   Apply 1 application topically daily as needed (for irritation).          . ZOSTAVAX 16109 UNT/0.65ML injection      once.          Triage Vitals: BP 134/44  Pulse 63  Temp(Src) 98.2 F (36.8 C) (Oral)  Resp 20  Ht 5\' 8"  (1.727 m)  Wt 230 lb (104.327 kg)  BMI 34.98 kg/m2  SpO2 99%  Physical Exam  Constitutional: She is oriented to person, place, and time. She appears well-developed.  HENT:  Head: Normocephalic.  Eyes: Conjunctivae and EOM are normal. No scleral icterus.  Neck: Neck supple. No thyromegaly present.  Pulmonary/Chest: No stridor.  Abdominal: She exhibits no distension. There is no tenderness. There is no rebound.  Musculoskeletal: Normal range of motion. She exhibits no edema.  Lymphadenopathy:    She has no cervical adenopathy.  Neurological: She is oriented to person, place, and time. She exhibits normal muscle tone. Coordination normal.  Skin: No rash noted. No erythema.  2 cm laceration to the left temporal area. Ecchymosis to right forehead.   Psychiatric: She has a normal mood and affect. Her  behavior is normal.    ED Course  Procedures (including critical care time)  DIAGNOSTIC STUDIES: Oxygen Saturation is 99% on room air, normal by my interpretation.    COORDINATION OF CARE: 8:14 PM- Will order CTs of the head and neck. Discussed treatment plan with patient at bedside and patient verbalized agreement.   9:29 PM- Discussed that imaging results were normal. Will order a tetanus vaccination. Will repair the laceration with staples. Advised patient to have the staples removed in about 1 week. Discussed treatment plan with patient at bedside and patient verbalized agreement.    LACERATION REPAIR PROCEDURE NOTE The patient's identification was confirmed and consent was obtained. This procedure was performed by [name] Lennert, MD at 9:30 PM. Site: left temporal area Sterile procedures observed Length: 2 cm # of Staples: 3 Technique: staples Complexity: simple Antibx ointment applied Tetanus ordered Site anesthetized, irrigated with NS, explored without evidence of foreign body, wound well approximated, site covered with dry, sterile dressing.  Patient tolerated procedure well without complications. Instructions for care discussed verbally and patient provided with additional written instructions for homecare and f/u.  Labs Review Labs Reviewed  GLUCOSE, CAPILLARY - Abnormal; Notable for the following:    Glucose-Capillary 136 (*)    All other components within normal limits   Imaging Review Ct Head  Wo Contrast  09/18/2013   CLINICAL DATA:  Fall  EXAM: CT HEAD WITHOUT CONTRAST  CT CERVICAL SPINE WITHOUT CONTRAST  TECHNIQUE: Multidetector CT imaging of the head and cervical spine was performed following the standard protocol without intravenous contrast. Multiplanar CT image reconstructions of the cervical spine were also generated.  COMPARISON:  02/11/2013  FINDINGS: CT HEAD FINDINGS  Prominence of the sulci and ventricles identified consistent with age related brain  atrophy. Mild patchy low attenuation within the subcortical and periventricular white matter is identified compatible with chronic microvascular disease. No acute cortical infarct, hemorrhage, or mass lesion ispresent. No significant extra-axial fluid collection is present. The paranasal sinuses andmastoid air cells are clear. The osseous skull is intact. Left frontal scalp laceration is identified, image 51/series 3.  CT CERVICAL SPINE FINDINGS  Normal alignment of the cervical spine. The vertebral body heights are well preserved. Disc space narrowing and ventral endplate spurring is noted at C5-6 and C6-C7. No acute fractures or subluxations identified. The prevertebral soft tissue space appears normal.  IMPRESSION: 1. No acute intracranial abnormalities. 2. Brain atrophy. 3. Left scalp laceration 4. Cervical spondylosis. No evidence for cervical spine fracture or subluxation.   Electronically Signed   By: Signa Kell M.D.   On: 09/18/2013 21:03   Ct Cervical Spine Wo Contrast  09/18/2013   CLINICAL DATA:  Fall  EXAM: CT HEAD WITHOUT CONTRAST  CT CERVICAL SPINE WITHOUT CONTRAST  TECHNIQUE: Multidetector CT imaging of the head and cervical spine was performed following the standard protocol without intravenous contrast. Multiplanar CT image reconstructions of the cervical spine were also generated.  COMPARISON:  02/11/2013  FINDINGS: CT HEAD FINDINGS  Prominence of the sulci and ventricles identified consistent with age related brain atrophy. Mild patchy low attenuation within the subcortical and periventricular white matter is identified compatible with chronic microvascular disease. No acute cortical infarct, hemorrhage, or mass lesion ispresent. No significant extra-axial fluid collection is present. The paranasal sinuses andmastoid air cells are clear. The osseous skull is intact. Left frontal scalp laceration is identified, image 51/series 3.  CT CERVICAL SPINE FINDINGS  Normal alignment of the cervical  spine. The vertebral body heights are well preserved. Disc space narrowing and ventral endplate spurring is noted at C5-6 and C6-C7. No acute fractures or subluxations identified. The prevertebral soft tissue space appears normal.  IMPRESSION: 1. No acute intracranial abnormalities. 2. Brain atrophy. 3. Left scalp laceration 4. Cervical spondylosis. No evidence for cervical spine fracture or subluxation.   Electronically Signed   By: Signa Kell M.D.   On: 09/18/2013 21:03    EKG Interpretation   None       MDM  No diagnosis found.   The chart was scribed for me under my direct supervision.  I personally performed the history, physical, and medical decision making and all procedures in the evaluation of this patient.[name] Lennert, MD 09/18/13 2135

## 2013-09-18 NOTE — ED Notes (Signed)
Pt reports striking head against dresser after falling while attempting to get out of bed. States she is unsure as to if she had LOC. Laceration to the left side of head. Dressing in place, bleeding controlled at present.

## 2013-09-22 ENCOUNTER — Encounter (INDEPENDENT_AMBULATORY_CARE_PROVIDER_SITE_OTHER): Payer: Self-pay

## 2013-09-22 ENCOUNTER — Emergency Department (HOSPITAL_COMMUNITY)
Admission: EM | Admit: 2013-09-22 | Discharge: 2013-09-23 | Disposition: A | Discharge status: Home / Self Care | Payer: Medicare Other | Attending: Emergency Medicine | Admitting: Emergency Medicine

## 2013-09-22 ENCOUNTER — Encounter (HOSPITAL_COMMUNITY): Payer: Self-pay | Admitting: Emergency Medicine

## 2013-09-22 ENCOUNTER — Emergency Department (HOSPITAL_COMMUNITY): Payer: Medicare Other

## 2013-09-22 ENCOUNTER — Ambulatory Visit (INDEPENDENT_AMBULATORY_CARE_PROVIDER_SITE_OTHER): Payer: Medicare Other | Admitting: Urology

## 2013-09-22 DIAGNOSIS — I10 Essential (primary) hypertension: Secondary | ICD-10-CM | POA: Insufficient documentation

## 2013-09-22 DIAGNOSIS — Z79899 Other long term (current) drug therapy: Secondary | ICD-10-CM | POA: Insufficient documentation

## 2013-09-22 DIAGNOSIS — Z8669 Personal history of other diseases of the nervous system and sense organs: Secondary | ICD-10-CM | POA: Insufficient documentation

## 2013-09-22 DIAGNOSIS — Z860100 Personal history of colon polyps, unspecified: Secondary | ICD-10-CM | POA: Insufficient documentation

## 2013-09-22 DIAGNOSIS — E119 Type 2 diabetes mellitus without complications: Secondary | ICD-10-CM

## 2013-09-22 DIAGNOSIS — N952 Postmenopausal atrophic vaginitis: Secondary | ICD-10-CM

## 2013-09-22 DIAGNOSIS — E669 Obesity, unspecified: Secondary | ICD-10-CM | POA: Insufficient documentation

## 2013-09-22 DIAGNOSIS — Z862 Personal history of diseases of the blood and blood-forming organs and certain disorders involving the immune mechanism: Secondary | ICD-10-CM | POA: Insufficient documentation

## 2013-09-22 DIAGNOSIS — J45901 Unspecified asthma with (acute) exacerbation: Secondary | ICD-10-CM | POA: Insufficient documentation

## 2013-09-22 DIAGNOSIS — R627 Adult failure to thrive: Secondary | ICD-10-CM | POA: Insufficient documentation

## 2013-09-22 DIAGNOSIS — I2510 Atherosclerotic heart disease of native coronary artery without angina pectoris: Secondary | ICD-10-CM | POA: Insufficient documentation

## 2013-09-22 DIAGNOSIS — Z7982 Long term (current) use of aspirin: Secondary | ICD-10-CM | POA: Insufficient documentation

## 2013-09-22 DIAGNOSIS — R5381 Other malaise: Secondary | ICD-10-CM | POA: Insufficient documentation

## 2013-09-22 DIAGNOSIS — N3020 Other chronic cystitis without hematuria: Secondary | ICD-10-CM

## 2013-09-22 DIAGNOSIS — Z794 Long term (current) use of insulin: Secondary | ICD-10-CM | POA: Insufficient documentation

## 2013-09-22 DIAGNOSIS — R51 Headache: Secondary | ICD-10-CM | POA: Insufficient documentation

## 2013-09-22 DIAGNOSIS — K7460 Unspecified cirrhosis of liver: Secondary | ICD-10-CM

## 2013-09-22 DIAGNOSIS — Z9861 Coronary angioplasty status: Secondary | ICD-10-CM | POA: Insufficient documentation

## 2013-09-22 DIAGNOSIS — R197 Diarrhea, unspecified: Secondary | ICD-10-CM | POA: Insufficient documentation

## 2013-09-22 DIAGNOSIS — K7689 Other specified diseases of liver: Secondary | ICD-10-CM

## 2013-09-22 DIAGNOSIS — K219 Gastro-esophageal reflux disease without esophagitis: Secondary | ICD-10-CM | POA: Insufficient documentation

## 2013-09-22 DIAGNOSIS — IMO0002 Reserved for concepts with insufficient information to code with codable children: Secondary | ICD-10-CM

## 2013-09-22 DIAGNOSIS — R531 Weakness: Secondary | ICD-10-CM

## 2013-09-22 LAB — CBC WITH DIFFERENTIAL/PLATELET
Basophils Absolute: 0 10*3/uL (ref 0.0–0.1)
Eosinophils Relative: 3 % (ref 0–5)
Lymphocytes Relative: 30 % (ref 12–46)
Lymphs Abs: 1.1 10*3/uL (ref 0.7–4.0)
MCV: 85.8 fL (ref 78.0–100.0)
Neutro Abs: 2.1 10*3/uL (ref 1.7–7.7)
Neutrophils Relative %: 58 % (ref 43–77)
Platelets: 106 10*3/uL — ABNORMAL LOW (ref 150–400)
RBC: 4.45 MIL/uL (ref 3.87–5.11)
RDW: 14.5 % (ref 11.5–15.5)
WBC: 3.7 10*3/uL — ABNORMAL LOW (ref 4.0–10.5)

## 2013-09-22 LAB — AMMONIA: Ammonia: 52 umol/L (ref 11–60)

## 2013-09-22 LAB — COMPREHENSIVE METABOLIC PANEL
AST: 21 U/L (ref 0–37)
Albumin: 3.5 g/dL (ref 3.5–5.2)
Alkaline Phosphatase: 126 U/L — ABNORMAL HIGH (ref 39–117)
BUN: 13 mg/dL (ref 6–23)
Chloride: 106 mEq/L (ref 96–112)
Creatinine, Ser: 0.65 mg/dL (ref 0.50–1.10)
GFR calc Af Amer: >90 mL/min (ref >90)
GFR calc non Af Amer: 88 mL/min — ABNORMAL LOW (ref >90)
Glucose, Bld: 184 mg/dL — ABNORMAL HIGH (ref 70–99)
Potassium: 3.7 mEq/L (ref 3.5–5.1)
Total Bilirubin: 2.1 mg/dL — ABNORMAL HIGH (ref 0.3–1.2)

## 2013-09-22 LAB — URINE MICROSCOPIC-ADD ON

## 2013-09-22 LAB — URINALYSIS, ROUTINE W REFLEX MICROSCOPIC
Bilirubin Urine: NEGATIVE
Glucose, UA: NEGATIVE mg/dL
Ketones, ur: NEGATIVE mg/dL
Nitrite: NEGATIVE
Specific Gravity, Urine: <1.005 — ABNORMAL LOW (ref 1.005–1.030)
pH: 6 (ref 5.0–8.0)

## 2013-09-22 MED ORDER — SODIUM CHLORIDE 0.9 % IV BOLUS (SEPSIS)
500.0000 mL | Freq: Once | INTRAVENOUS | Status: AC
  Administered 2013-09-22: 500 mL via INTRAVENOUS

## 2013-09-22 NOTE — ED Provider Notes (Signed)
Pt seen by medicine Pt has now elected to go home and f/u with PCP   Joya Gaskins, MD 09/22/13 2306

## 2013-09-22 NOTE — ED Provider Notes (Signed)
CSN: [id number]     Arrival date & time 09/22/13  1750 History  This chart was scribed for Joya Gaskins, MD by Bennett Scrape, ED Scribe. This patient was seen in room APA07/APA07 and the patient's care was started at 6:10 PM.     Chief Complaint  Patient presents with  . Weakness  . elevated ammonia level     Patient is a 72 y.o. female presenting with weakness. The history is provided by the patient and a relative. No language interpreter was used.  Weakness This is a new problem. The current episode started 2 days ago. The problem occurs constantly. The problem has been gradually worsening. Associated symptoms include headaches. Pertinent negatives include no chest pain, no abdominal pain and no shortness of breath. The symptoms are aggravated by walking. The symptoms are relieved by rest. She has tried nothing for the symptoms.    HPI Comments: [name] [name] is a 72 y.o. female who presents to the Emergency Department with daughter complaining of generalized weakness that started 2 days. Daughter states that the pt develops similar symptoms with UTIs and increased ammonia levels. Pt denies any recent UTI diagnoses. She was seen by her urologist and had an UA done but does not have the results yet. Pt denies any fevers, CP, abdominal pain, emesis or She is currently on Lactulose and has chronic diarrhea with no changes. She was seen in the ED on 11/7 for a fall with head trauma. She has staples placed and was discharged home in stable condition. Daughter denies any immediate changes after discharge. At baseline, she is able to position herself into her wheelchair but has not been able to complete this task today due to weakness. No falls since 11/7.   Past Medical History  Diagnosis Date  . CAD in native artery   . Thrombocytopenia, unspecified   . Unspecified hereditary and idiopathic peripheral neuropathy   . Unspecified fall   . Other and unspecified hyperlipidemia   .  Unspecified essential hypertension   . Obesity, unspecified   . Shortness of breath   . Personal history of neurosis   . Intrinsic asthma, unspecified   . Allergic rhinitis, cause unspecified   . Cirrhosis of liver     NASH, afp on 06/17/12 =3.7, per pt she had hep B vaccines in 1996  . Colon adenomas 03/08/10    tcs by Dr. Jena Gauss  . Hyperplastic polyps of stomach 03/08/10    tcs by Dr. Geni Bers  . Chronic gastritis 03/09/11    egd by Dr. Jena Gauss  . History of hemorrhoids 03/08/10    tcs- internal and external  . Diverticula of colon 03/08/10    L side  . Hiatal hernia 03/08/10  . Esophagitis, erosive 03/08/10  . GERD (gastroesophageal reflux disease)   . Wears glasses   . Type II or unspecified type diabetes mellitus without mention of complication, not stated as uncontrolled   . Vertigo   . Pancreatitis   . Bilateral leg weakness    Past Surgical History  Procedure Laterality Date  . Hysterectomy and btl      s/p  . Abdominal hysterectomy    . Tumor excision  2003    rt arm and left foot  . Colonoscopy  03/08/10    Dr. [name]-->ext/int hemorrhoids, anal paipilla, rectal polyps, desc polyps, cecal polyp, left-sided diverticula. suboptiomal prep. next TCS 02/2015. multiple adenomas  . Esophagogastroduodenoscopy  03/08/10    Dr. Dellie Catholic erosive RE, small hh,  antral erosions, small 1cm are of mucosal indentation along gastric body of doubtful significance, cystic nodularity of hypophyarynx, base of tongue, base of epiglottis. benign gastric biopsies  . Tubal ligation    . Esophagogastroduodenoscopy  10/08/2011    Dr. Dionisio David esophageal varices, antral erosion. next egd 09/2013  . Foot surgery  2003    lt foot  . Eye surgery      cataracts bilateral  . Orif humerus fracture Right 02/17/2013    Procedure: OPEN REDUCTION INTERNAL FIXATION (ORIF) RIGHT PROXIMAL HUMERUS FRACTURE;  Surgeon: Budd Palmer, MD;  Location: [facility] OR;  Service: Orthopedics;  Laterality: Right;  . Partial gastrectomy       family denies  . Breast surgery    . Cardiac catheterization  02/25/2007    Dr. Clarene Duke:  normal LV systolic function, mild irregularities of LAD   Family History  Problem Relation Age of Onset  . Coronary artery disease      FH  . Diabetes      FH  . Heart disease Mother   . Colon cancer Neg Hx    History  Substance Use Topics  . Smoking status: Never Smoker   . Smokeless tobacco: Not on file  . Alcohol Use: No   No OB history provided.  Review of Systems  Respiratory: Negative for shortness of breath.   Cardiovascular: Negative for chest pain.  Gastrointestinal: Positive for diarrhea (chronic, no changes ). Negative for nausea, vomiting and abdominal pain.  Neurological: Positive for weakness and headaches.  All other systems reviewed and are negative.    Allergies  Morphine and related; Prochlorperazine edisylate; and Compazine  Home Medications   Current Outpatient Rx  Name  Route  Sig  Dispense  Refill  . ALPRAZolam (XANAX) 0.5 MG tablet   Oral   Take 0.5 mg by mouth 3 (three) times daily as needed for anxiety.         Marland Kitchen amoxicillin-clavulanate (AUGMENTIN) 875-125 MG per tablet   Oral   Take 1 tablet by mouth every 12 (twelve) hours.   5 tablet   0   . aspirin (BAYER LOW STRENGTH) 81 MG EC tablet   Oral   Take 81 mg by mouth daily.           . budesonide-formoterol (SYMBICORT) 160-4.5 MCG/ACT inhaler   Inhalation   Inhale 2 puffs into the lungs 2 (two) times daily.           . cephALEXin (KEFLEX) 250 MG capsule               . enalapril (VASOTEC) 5 MG tablet   Oral   Take 5 mg by mouth daily.           Marland Kitchen esomeprazole (NEXIUM) 40 MG capsule   Oral   Take 40 mg by mouth every morning.         Marland Kitchen ESTRACE VAGINAL 0.1 MG/GM vaginal cream   Vaginal   Place vaginally every other day. At  bedtime         . GENERLAC 10 GM/15ML SOLN               . insulin aspart (NOVOLOG) 100 UNIT/ML injection   Subcutaneous   Inject 10-14  Units into the skin daily as needed (90-150= 10 units 151-200= 11 units 201-250= 12 units 250-300= 13 units 301-350= 14 units as directed per sliding scale instructions). Per sliding scale. Patient injects 10-40 units for levels above 150 to 300         .  insulin detemir (LEVEMIR) 100 UNIT/ML injection   Subcutaneous   Inject 60 Units into the skin at bedtime.          Marland Kitchen lactulose (CHRONULAC) 10 GM/15ML solution   Oral   Take 60 mLs (40 g total) by mouth 3 (three) times daily.   5400 mL   3     1 month supply   . meclizine (ANTIVERT) 25 MG tablet   Oral   Take 25 mg by mouth 3 (three) times daily as needed for dizziness or nausea.          . Menthol, Topical Analgesic, (BIOFREEZE) 4 % GEL   Apply externally   Apply 1 application topically daily as needed (for arm pain).         . Multiple Vitamins-Minerals (CENTRUM SILVER PO)   Oral   Take 1 tablet by mouth daily.          Marland Kitchen oxyCODONE (OXY IR/ROXICODONE) 5 MG immediate release tablet               . pregabalin (LYRICA) 100 MG capsule   Oral   Take 100-200 mg by mouth daily.          . quinapril (ACCUPRIL) 5 MG tablet   Oral   Take 5 mg by mouth daily.         Marland Kitchen triamcinolone cream (KENALOG) 0.1 %   Topical   Apply 1 application topically daily as needed (for irritation).          . ZOSTAVAX 78295 UNT/0.65ML injection      once.          Triage Vitals: BP 126/53  Pulse 68  Temp(Src) 98 F (36.7 C) (Oral)  Resp 20  SpO2 98%  Physical Exam  Nursing note and vitals reviewed.  CONSTITUTIONAL: Well developed/well nourished HEAD: Normocephalic/atraumatic, staples noted in left scalp EYES: EOMI/PERRL ENMT: Mucous membranes moist NECK: supple no meningeal signs CV: S1/S2 noted, no murmurs/rubs/gallops noted LUNGS: Lungs are clear to auscultation bilaterally, no apparent distress ABDOMEN: soft, nontender, no rebound or guarding NEURO: Pt is awake/alert, moves all extremitiesx4 EXTREMITIES:  pulses normal, full ROM, no focal motor deficit noted  SKIN: warm, color normal PSYCH: no abnormalities of mood noted  ED Course  Procedures (including critical care time)  Medications  sodium chloride 0.9 % bolus 500 mL (not administered)    DIAGNOSTIC STUDIES: Oxygen Saturation is 98% on room air, normal by my interpretation.    COORDINATION OF CARE: 6:15 PM-Advised pt that a CT of head is not indicated at this time. Will check ammonia levels and UA first. Discussed treatment plan which includes in and out cath, IV fluids, CBC and CMP with pt at bedside and pt agreed to plan.   8:26 PM Labs at baseline but still feels generalized weakness.  Also reports headache as well.  Given recent fall (initially had negative CT head) and h/o thrombocytopenia, delayed head bleed possible Will get CT head 10:03 PM D/w dr Neoma Laming will evaluate for admission Pt stable at this time  tPA in stroke considered but not given due to:  Onset over 3-4.5hours     EKG Interpretation   None       MDM  No diagnosis found. Nursing notes including past medical history and social history reviewed and considered in documentation Labs/vital reviewed and considered   I personally performed the services described in this documentation, which was scribed in my presence. The recorded information has been  reviewed and is accurate.      Joya Gaskins, MD 09/22/13 2203

## 2013-09-22 NOTE — ED Notes (Signed)
Pt c/o generalized weakness since yesterday and "little" confusion.  Pt has history of cirrhosis of the liver and has had problems with elevated ammonia levels in the past.  Denies any pain, n/v.

## 2013-09-22 NOTE — Consult Note (Signed)
[facility] Medical Consultation  DWAYNE BEGAY ZOX:[id number] DOB: [birth year] DOA: 09/22/2013   PCP: Colette Ribas, MD    Requesting physician: Dr. Bebe Shaggy Date of consultation: 09/23/2013 Reason for consultation: Generalized Weakness  Chief Complaint: Legs are weak  HPI: [name] [name] is a 72 y.o. female with a past medical history of nonalcoholic liver cirrhosis, diabetes, who was in the hospital in October and was discharged after being managed for hepatic encephalopathy and UTI. She has been followed by urology for her frequent UTIs and was placed on once daily cephalexin. She presents today because that she has had the generalized weakness. She says the weakness appeared to be mostly in the legs, and she is unable to walk. She denies any pain per se. She's had a few falls over the last couple of weeks and she was actually in the emergency department on November 7 with a fall and had a laceration to her scalp, which was stapled. According to the husband she is unable to do even basic activities such as shifting in the bed or being able to walk without feeling unsteady. Denies any headaches. No fever, chills. Denies any focal weakness. No seizure type activity. No syncopal episodes. Denies any urinary complaints at this time. She had normal bowel movement earlier today. Denies any back pain. Denies any incontinence of urine or stool.   Home Medications: Prior to Admission medications   Medication Sig Start Date End Date Taking? Authorizing Provider  ALPRAZolam Prudy Feeler) 0.5 MG tablet Take 0.5 mg by mouth 3 (three) times daily as needed for anxiety. 08/09/12  Yes Erick Blinks, MD  amoxicillin-clavulanate (AUGMENTIN) 875-125 MG per tablet Take 1 tablet by mouth every 12 (twelve) hours. 09/01/13  Yes Standley Brooking, MD  aspirin (BAYER LOW STRENGTH) 81 MG EC tablet Take 81 mg by mouth daily.     Yes Historical Provider, MD  budesonide-formoterol (SYMBICORT) 160-4.5 MCG/ACT  inhaler Inhale 2 puffs into the lungs 2 (two) times daily.     Yes Historical Provider, MD  enalapril (VASOTEC) 5 MG tablet Take 5 mg by mouth daily.     Yes Historical Provider, MD  esomeprazole (NEXIUM) 40 MG capsule Take 40 mg by mouth every morning.   Yes Historical Provider, MD  ESTRACE VAGINAL 0.1 MG/GM vaginal cream Place vaginally every other day. At  bedtime 08/11/13  Yes Historical Provider, MD  GENERLAC 10 GM/15ML SOLN Take 10 g by mouth 3 (three) times daily.  09/06/13  Yes Historical Provider, MD  insulin aspart (NOVOLOG) 100 UNIT/ML injection Inject 10-14 Units into the skin daily as needed (90-150= 10 units 151-200= 11 units 201-250= 12 units 250-300= 13 units 301-350= 14 units as directed per sliding scale instructions). Per sliding scale. Patient injects 10-40 units for levels above 150 to 300   Yes Historical Provider, MD  insulin detemir (LEVEMIR) 100 UNIT/ML injection Inject 60 Units into the skin at bedtime.    Yes Historical Provider, MD  meclizine (ANTIVERT) 25 MG tablet Take 25 mg by mouth 3 (three) times daily as needed for dizziness or nausea.    Yes Historical Provider, MD  Multiple Vitamins-Minerals (CENTRUM SILVER PO) Take 1 tablet by mouth daily.    Yes Historical Provider, MD  oxyCODONE (OXY IR/ROXICODONE) 5 MG immediate release tablet Take 5 mg by mouth daily as needed for moderate pain, severe pain or breakthrough pain.  08/24/13  Yes Historical Provider, MD  pregabalin (LYRICA) 100 MG capsule Take 100-200 mg by mouth daily.  Yes Historical Provider, MD  quinapril (ACCUPRIL) 5 MG tablet Take 5 mg by mouth daily.   Yes Historical Provider, MD  triamcinolone cream (KENALOG) 0.1 % Apply 1 application topically daily as needed (for irritation).  06/26/13  Yes Historical Provider, MD  Menthol, Topical Analgesic, (BIOFREEZE) 4 % GEL Apply 1 application topically daily as needed (for arm pain).    Historical Provider, MD  ZOSTAVAX 45409 UNT/0.65ML injection once. 08/24/13    Historical Provider, MD    Allergies:  Allergies  Allergen Reactions  . Morphine And Related Itching  . Prochlorperazine Edisylate Other (See Comments)    Causes anxiety/numbness  . Compazine [Prochlorperazine Edisylate] Anxiety    Causes anxiety & nervousness    Past Medical History: Past Medical History  Diagnosis Date  . CAD in native artery   . Thrombocytopenia, unspecified   . Unspecified hereditary and idiopathic peripheral neuropathy   . Unspecified fall   . Other and unspecified hyperlipidemia   . Unspecified essential hypertension   . Obesity, unspecified   . Shortness of breath   . Personal history of neurosis   . Intrinsic asthma, unspecified   . Allergic rhinitis, cause unspecified   . Cirrhosis of liver     NASH, afp on 06/17/12 =3.7, per pt she had hep B vaccines in 1996  . Colon adenomas 03/08/10    tcs by Dr. Jena Gauss  . Hyperplastic polyps of stomach 03/08/10    tcs by Dr. Geni Bers  . Chronic gastritis 03/09/11    egd by Dr. Jena Gauss  . History of hemorrhoids 03/08/10    tcs- internal and external  . Diverticula of colon 03/08/10    L side  . Hiatal hernia 03/08/10  . Esophagitis, erosive 03/08/10  . GERD (gastroesophageal reflux disease)   . Wears glasses   . Type II or unspecified type diabetes mellitus without mention of complication, not stated as uncontrolled   . Vertigo   . Pancreatitis   . Bilateral leg weakness     Past Surgical History  Procedure Laterality Date  . Hysterectomy and btl      s/p  . Abdominal hysterectomy    . Tumor excision  2003    rt arm and left foot  . Colonoscopy  03/08/10    Dr. [name]-->ext/int hemorrhoids, anal paipilla, rectal polyps, desc polyps, cecal polyp, left-sided diverticula. suboptiomal prep. next TCS 02/2015. multiple adenomas  . Esophagogastroduodenoscopy  03/08/10    Dr. Dellie Catholic erosive RE, small hh, antral erosions, small 1cm are of mucosal indentation along gastric body of doubtful significance, cystic  nodularity of hypophyarynx, base of tongue, base of epiglottis. benign gastric biopsies  . Tubal ligation    . Esophagogastroduodenoscopy  10/08/2011    Dr. Dionisio David esophageal varices, antral erosion. next egd 09/2013  . Foot surgery  2003    lt foot  . Eye surgery      cataracts bilateral  . Orif humerus fracture Right 02/17/2013    Procedure: OPEN REDUCTION INTERNAL FIXATION (ORIF) RIGHT PROXIMAL HUMERUS FRACTURE;  Surgeon: Budd Palmer, MD;  Location: [facility] OR;  Service: Orthopedics;  Laterality: Right;  . Partial gastrectomy      family denies  . Breast surgery    . Cardiac catheterization  02/25/2007    Dr. Clarene Duke:  normal LV systolic function, mild irregularities of LAD    Social History:  She lives with her husband. No smoking, alcohol or illicit drug use. Uses a walker to ambulate.  Family History:  Family History  Problem Relation Age of Onset  . Coronary artery disease      FH  . Diabetes      FH  . Heart disease Mother   . Colon cancer Neg Hx      Review of Systems - History obtained from the patient General ROS: positive for  - fatigue Psychological ROS: negative Ophthalmic ROS: negative ENT ROS: negative Allergy and Immunology ROS: negative Hematological and Lymphatic ROS: negative Endocrine ROS: negative Respiratory ROS: no cough, shortness of breath, or wheezing Cardiovascular ROS: no chest pain or dyspnea on exertion Gastrointestinal ROS: no abdominal pain, change in bowel habits, or black or bloody stools Genito-Urinary ROS: no dysuria, trouble voiding, or hematuria Musculoskeletal ROS: negative Neurological ROS: no TIA or stroke symptoms Dermatological ROS: negative  Physical Examination: Filed Vitals:   09/22/13 1805 09/22/13 2000 09/22/13 2131 09/22/13 2202  BP:    132/62  Pulse:  65  66  Temp:   98 F (36.7 C) 98.4 F (36.9 C)  TempSrc:    Oral  Resp: 20 15    SpO2:  99%      General appearance: alert, cooperative, appears stated age, no  distress and morbidly obese Head: Normocephalic, without obvious abnormality, atraumatic Eyes: conjunctivae/corneas clear. PERRL, EOM's intact. Throat: lips, mucosa, and tongue normal; teeth and gums normal Neck: no adenopathy, no carotid bruit, no JVD, supple, symmetrical, trachea midline and thyroid not enlarged, symmetric, no tenderness/mass/nodules Resp: clear to auscultation bilaterally Cardio: regular rate and rhythm, S1, S2 normal, no murmur, click, rub or gallop GI: soft, non-tender; bowel sounds normal; no masses,  no organomegaly Extremities: extremities normal, atraumatic, no cyanosis or edema. Full range of motion of all joints. Pulses: 2+ and symmetric Skin: Skin color, texture, turgor normal. No rashes or lesions Lymph nodes: Cervical, supraclavicular, and axillary nodes normal. Neurologic: She is alert and oriented x3. No cranial nerve deficits are noted. Motor strength is 5 out of 5. A lateral upper and lower extremities. Straight leg raising test was normal. Gait was not assessed however. Cerebellar signs are normal.  Laboratory Data: Results for orders placed during the hospital encounter of 09/22/13 (from the past 48 hour(s))  COMPREHENSIVE METABOLIC PANEL     Status: Abnormal   Collection Time    09/22/13  7:26 PM      Result Value Range   Sodium 141  135 - 145 mEq/L   Potassium 3.7  3.5 - 5.1 mEq/L   Chloride 106  96 - 112 mEq/L   CO2 26  19 - 32 mEq/L   Glucose, Bld 184 (*) 70 - 99 mg/dL   BUN 13  6 - 23 mg/dL   Creatinine, Ser 1.61  0.50 - 1.10 mg/dL   Calcium 9.2  8.4 - 09.6 mg/dL   Total Protein 6.0  6.0 - 8.3 g/dL   Albumin 3.5  3.5 - 5.2 g/dL   AST 21  0 - 37 U/L   ALT 13  0 - 35 U/L   Alkaline Phosphatase 126 (*) 39 - 117 U/L   Total Bilirubin 2.1 (*) 0.3 - 1.2 mg/dL   GFR calc non Af Amer 88 (*) >90 mL/min   GFR calc Af Amer >90  >90 mL/min   Comment: (NOTE)     The eGFR has been calculated using the CKD EPI equation.     This calculation has not  been validated in all clinical situations.     eGFR's persistently <90 mL/min signify possible Chronic Kidney  Disease.  CBC WITH DIFFERENTIAL     Status: Abnormal   Collection Time    09/22/13  7:26 PM      Result Value Range   WBC 3.7 (*) 4.0 - 10.5 K/uL   RBC 4.45  3.87 - 5.11 MIL/uL   Hemoglobin 13.3  12.0 - 15.0 g/dL   HCT 60.4  54.0 - 98.1 %   MCV 85.8  78.0 - 100.0 fL   MCH 29.9  26.0 - 34.0 pg   MCHC 34.8  30.0 - 36.0 g/dL   RDW 19.1  47.8 - 29.5 %   Platelets 106 (*) 150 - 400 K/uL   Comment: PLATELET COUNT CONFIRMED BY SMEAR   Neutrophils Relative % 58  43 - 77 %   Neutro Abs 2.1  1.7 - 7.7 K/uL   Lymphocytes Relative 30  12 - 46 %   Lymphs Abs 1.1  0.7 - 4.0 K/uL   Monocytes Relative 8  3 - 12 %   Monocytes Absolute 0.3  0.1 - 1.0 K/uL   Eosinophils Relative 3  0 - 5 %   Eosinophils Absolute 0.1  0.0 - 0.7 K/uL   Basophils Relative 1  0 - 1 %   Basophils Absolute 0.0  0.0 - 0.1 K/uL  AMMONIA     Status: None   Collection Time    09/22/13  7:26 PM      Result Value Range   Ammonia 52  11 - 60 umol/L  URINALYSIS, ROUTINE W REFLEX MICROSCOPIC     Status: Abnormal   Collection Time    09/22/13  7:35 PM      Result Value Range   Color, Urine YELLOW  YELLOW   APPearance CLOUDY (*) CLEAR   Specific Gravity, Urine <1.005 (*) 1.005 - 1.030   pH 6.0  5.0 - 8.0   Glucose, UA NEGATIVE  NEGATIVE mg/dL   Hgb urine dipstick TRACE (*) NEGATIVE   Bilirubin Urine NEGATIVE  NEGATIVE   Ketones, ur NEGATIVE  NEGATIVE mg/dL   Protein, ur NEGATIVE  NEGATIVE mg/dL   Urobilinogen, UA 0.2  0.0 - 1.0 mg/dL   Nitrite NEGATIVE  NEGATIVE   Leukocytes, UA SMALL (*) NEGATIVE  URINE MICROSCOPIC-ADD ON     Status: Abnormal   Collection Time    09/22/13  7:35 PM      Result Value Range   Squamous Epithelial / LPF FEW (*) RARE   WBC, UA 0-2  <3 WBC/hpf   Bacteria, UA RARE  RARE    Imaging Studies: Ct Head Wo Contrast  09/22/2013   CLINICAL DATA:  Fall. Blunt trauma to left head.  Headache and weakness. Vertigo.  EXAM: CT HEAD WITHOUT CONTRAST  TECHNIQUE: Contiguous axial images were obtained from the base of the skull through the vertex without intravenous contrast.  COMPARISON:  09/18/2013  FINDINGS: There is no evidence of intracranial hemorrhage, brain edema, or other signs of acute infarction. There is no evidence of intracranial mass lesion or mass effect. No abnormal extraaxial fluid collections are identified.  Moderate to severe diffuse cerebral atrophy again noted as well as mild chronic small vessel disease. Ventricles are stable in size. No evidence of skull fracture.  IMPRESSION: No acute intracranial findings.  Stable cerebral atrophy and chronic small vessel disease.   Electronically Signed   By: Myles Rosenthal M.D.   On: 09/22/2013 21:37     Impression/Recommendations   This is a 72 year old, Caucasian female, who presented to the hospital  with generalized weakness. She complains specifically of her legs feeling weak and unable to walk and do basic activities.  Physical examination actually reveals good strength in both lower extremities. There is no evidence for any weakness whatsoever. Neurologically, she is intact. Reviewed her labs and her urine studies. Ammonia level actually is normal. Glucose is slightly elevated but not significantly. Repeat CT head did not show any acute process. UA is mildly abnormal, but no clear evidence for UTI. Plus she is asymptomatic.  I feel her weakness is due to deconditioning from recent hospital stay and her recent acute illness. Her chronic medical problems are playing a role at this time as well. I explained to the patient and her husband that I don't see any clear indication for hospitalization.   I recommended that they would be better off by contacting patient's primary care physician in the morning to see, if they can facilitate placement into a skilled nursing facility for rehabilitation. Patient is getting home health,  but only twice a week, and it apparently is going to run out this Friday.  Patient and her husband understand and they elected to go home. Once again, there was no reason for inpatient hospitalization at this time. I recommend no changes to her home medications. It should be noted that she is on cephalexin and no longer on nitrofurantoin or Augmentin.  The above was communicated to the ED physician, Dr. Bebe Shaggy, and he has discharged patient home.  [facility]  [facility] Pager [phone]  If 7PM-7AM, please contact night-coverage.  [email or web address] Password [facility]  09/23/2013, 12:42 AM

## 2013-09-22 NOTE — ED Notes (Signed)
x2 unsuccessful IV attempts made by Tarri Glenn RN-Leslie Cardwell RN in room to attempt IV access.

## 2013-09-23 NOTE — ED Notes (Signed)
Has been seen by Dr Rito Ehrlich. Pt is for discharge. Family and pt comfortable with plan

## 2013-10-02 ENCOUNTER — Ambulatory Visit (INDEPENDENT_AMBULATORY_CARE_PROVIDER_SITE_OTHER): Payer: Medicare Other | Admitting: Internal Medicine

## 2013-10-02 ENCOUNTER — Encounter (INDEPENDENT_AMBULATORY_CARE_PROVIDER_SITE_OTHER): Payer: Self-pay

## 2013-10-02 ENCOUNTER — Encounter: Payer: Self-pay | Admitting: Internal Medicine

## 2013-10-02 VITALS — BP 142/66 | HR 71 | Temp 97.7°F | Ht 68.0 in | Wt 236.0 lb

## 2013-10-02 DIAGNOSIS — K7460 Unspecified cirrhosis of liver: Secondary | ICD-10-CM

## 2013-10-02 DIAGNOSIS — K7290 Hepatic failure, unspecified without coma: Secondary | ICD-10-CM

## 2013-10-02 NOTE — Patient Instructions (Signed)
GET HEPATITIS A VACCINE DONE  Liver ultrasound January  2015  No change in medications today  Office visit with [place] February  2015

## 2013-10-02 NOTE — Progress Notes (Signed)
Primary Care Physician:  Colette Ribas, MD Primary Gastroenterologist:  Dr. Jena Gauss  Pre-Procedure History & Physical: HPI:  [name] [name] is a 72 y.o. female here for followup of [place]/cirrhosis. Since seen here in June lower extremity Doppler negative for DVT. Hospitalized with pancreatitis. Possibly biliary in origin. No significant bump in LFTs. No biliary dilation. Cholelithiasis, however. Patient declined endoscopic ultrasound to evaluate for other possible causes of pancreatitis i.e. Tumor, etc). Hospitalized multiple times since being seen here previously( lower extremity weakness felt to be due to neuropathy,  Head laceration. Cannot afford Xifaxan. Taking lactulose-titrated to 3-5 semi-formed bowel movements daily. Husband states mentally she is fairly clear these days. No longer on any diuretics-taken off at the nursing home. Continues on Nexium with good control of reflux symptoms.  Is due for screening EGD for varices currently. She'll having trouble with right arm fracture and recurrent UTIs. Did not get Hep A vaccine yet.  Weight down 2 pounds since her last visit.  Past Medical History  Diagnosis Date  . CAD in native artery   . Thrombocytopenia, unspecified   . Unspecified hereditary and idiopathic peripheral neuropathy   . Unspecified fall   . Other and unspecified hyperlipidemia   . Unspecified essential hypertension   . Obesity, unspecified   . Shortness of breath   . Personal history of neurosis   . Intrinsic asthma, unspecified   . Allergic rhinitis, cause unspecified   . Cirrhosis of liver     NASH, afp on 06/17/12 =3.7, per pt she had hep B vaccines in 1996, hep A status unknown  . Colon adenomas 03/08/10    tcs by Dr. Jena Gauss  . Hyperplastic polyps of stomach 03/08/10    tcs by Dr. Geni Bers  . Chronic gastritis 03/09/11    egd by Dr. Jena Gauss  . History of hemorrhoids 03/08/10    tcs- internal and external  . Diverticula of colon 03/08/10    L side  . Hiatal hernia  03/08/10  . Esophagitis, erosive 03/08/10  . GERD (gastroesophageal reflux disease)   . Wears glasses   . Type II or unspecified type diabetes mellitus without mention of complication, not stated as uncontrolled   . Vertigo   . Pancreatitis   . Bilateral leg weakness     Past Surgical History  Procedure Laterality Date  . Hysterectomy and btl      s/p  . Abdominal hysterectomy    . Tumor excision  2003    rt arm and left foot  . Colonoscopy  03/08/10    Dr. Giulliana Mcroberts-->ext/int hemorrhoids, anal paipilla, rectal polyps, desc polyps, cecal polyp, left-sided diverticula. suboptiomal prep. next TCS 02/2015. multiple adenomas  . Esophagogastroduodenoscopy  03/08/10    Dr. Dellie Catholic erosive RE, small hh, antral erosions, small 1cm are of mucosal indentation along gastric body of doubtful significance, cystic nodularity of hypophyarynx, base of tongue, base of epiglottis. benign gastric biopsies  . Tubal ligation    . Esophagogastroduodenoscopy  10/08/2011    Dr. Dionisio David esophageal varices, antral erosion. next egd 09/2013  . Foot surgery  2003    lt foot  . Eye surgery      cataracts bilateral  . Orif humerus fracture Right 02/17/2013    Procedure: OPEN REDUCTION INTERNAL FIXATION (ORIF) RIGHT PROXIMAL HUMERUS FRACTURE;  Surgeon: Budd Palmer, MD;  Location: [facility] OR;  Service: Orthopedics;  Laterality: Right;  . Partial gastrectomy      family denies  . Breast surgery    .  Cardiac catheterization  02/25/2007    Dr. Clarene Duke:  normal LV systolic function, mild irregularities of LAD    Prior to Admission medications   Medication Sig Start Date End Date Taking? Authorizing Provider  ALPRAZolam Prudy Feeler) 0.5 MG tablet Take 0.5 mg by mouth 3 (three) times daily as needed for anxiety. 08/09/12  Yes Erick Blinks, MD  aspirin (BAYER LOW STRENGTH) 81 MG EC tablet Take 81 mg by mouth daily.     Yes Historical Provider, MD  budesonide-formoterol (SYMBICORT) 160-4.5 MCG/ACT inhaler Inhale 2 puffs into  the lungs 2 (two) times daily.     Yes Historical Provider, MD  enalapril (VASOTEC) 5 MG tablet Take 5 mg by mouth daily.     Yes Historical Provider, MD  esomeprazole (NEXIUM) 40 MG capsule Take 40 mg by mouth every morning.   Yes Historical Provider, MD  ESTRACE VAGINAL 0.1 MG/GM vaginal cream Place vaginally every other day. At  bedtime 08/11/13  Yes Historical Provider, MD  GENERLAC 10 GM/15ML SOLN Take 10 g by mouth 3 (three) times daily.  09/06/13  Yes Historical Provider, MD  insulin aspart (NOVOLOG) 100 UNIT/ML injection Inject 10-14 Units into the skin daily as needed (90-150= 10 units 151-200= 11 units 201-250= 12 units 250-300= 13 units 301-350= 14 units as directed per sliding scale instructions). Per sliding scale. Patient injects 10-40 units for levels above 150 to 300   Yes Historical Provider, MD  insulin detemir (LEVEMIR) 100 UNIT/ML injection Inject 50 Units into the skin at bedtime.    Yes Historical Provider, MD  meclizine (ANTIVERT) 25 MG tablet Take 25 mg by mouth 3 (three) times daily as needed for dizziness or nausea.    Yes Historical Provider, MD  Menthol, Topical Analgesic, (BIOFREEZE) 4 % GEL Apply 1 application topically daily as needed (for arm pain).   Yes Historical Provider, MD  Multiple Vitamins-Minerals (CENTRUM SILVER PO) Take 1 tablet by mouth daily.    Yes Historical Provider, MD  oxyCODONE (OXY IR/ROXICODONE) 5 MG immediate release tablet Take 5 mg by mouth daily as needed for moderate pain, severe pain or breakthrough pain.  08/24/13  Yes Historical Provider, MD  pregabalin (LYRICA) 100 MG capsule Take 100-200 mg by mouth daily.    Yes Historical Provider, MD  quinapril (ACCUPRIL) 5 MG tablet Take 5 mg by mouth daily.   Yes Historical Provider, MD  triamcinolone cream (KENALOG) 0.1 % Apply 1 application topically daily as needed (for irritation).  06/26/13  Yes Historical Provider, MD  amoxicillin-clavulanate (AUGMENTIN) 875-125 MG per tablet Take 1 tablet by mouth  every 12 (twelve) hours. 09/01/13   Standley Brooking, MD  cephALEXin (KEFLEX) 250 MG capsule at bedtime. 09/11/13   Historical Provider, MD  ZOSTAVAX 86578 UNT/0.65ML injection once. 08/24/13   Historical Provider, MD    Allergies as of 10/02/2013 - Review Complete 10/02/2013  Allergen Reaction Noted  . Morphine and related Itching 04/26/2013  . Prochlorperazine edisylate Other (See Comments)   . Compazine [prochlorperazine edisylate] Anxiety 06/04/2012    Family History  Problem Relation Age of Onset  . Coronary artery disease      FH  . Diabetes      FH  . Heart disease Mother   . Colon cancer Neg Hx     History   Social History  . Marital Status: Married    Spouse Name: N/A    Number of Children: 3  . Years of Education: N/A   Occupational History  . Not  on file.   Social History Main Topics  . Smoking status: Never Smoker   . Smokeless tobacco: Not on file  . Alcohol Use: No  . Drug Use: No  . Sexual Activity: Not Currently   Other Topics Concern  . Not on file   Social History Narrative  . No narrative on file    Review of Systems: See HPI, otherwise negative ROS  Physical Exam: BP 142/66  Pulse 71  Temp(Src) 97.7 F (36.5 C) (Oral)  Ht 5\' 8"  (1.727 m)  Wt 236 lb (107.049 kg)  BMI 35.89 kg/m2 General:   Alert,  Well-developed, well-nourished, chronically ill lady resting comfortably. Accompanied by her husband. Confined to the wheelchair today. No asterixis Skin:  Intact without significant lesions or rashes. Eyes:  Sclera clear, no icterus.   Conjunctiva pink. Ears:  Normal auditory acuity. Nose:  No deformity, discharge,  or lesions. Mouth:  No deformity or lesions. Neck:  Supple; no masses or thyromegaly. No significant cervical adenopathy. Lungs:  Clear throughout to auscultation.   No wheezes, crackles, or rhonchi. No acute distress. Heart:  Regular rate and rhythm; no murmurs, clicks, rubs,  or gallops. Abdomen: Obese. Positive bowel  sounds normal bowel sounds.  Soft and nontender without appreciable mass or hepatosplenomegaly.  No significant ascites appreciated. Pulses:  Normal pulses noted. Extremities: 1+ lower extremity edema  Labs from November 11 this year looked good white count slightly low at 3.7 potassium 3.7 creatinine 0.6 by AST 21) 126 bilirubin 2.1 glucose 134 albumin 3.5  Impression:  [name] cirrhosis-stable. Encephalopathy appears to be fairly well managed with titrated doses of lactulose. Noncompliant with hepatitis A vaccine. History of otitis media at this time. No further workup desired by patient or family. GERD symptoms well controlled. Third spacing fairly well-controlled this time.  Recommendations:   GET HEPATITIS A VACCINE DONE  Liver ultrasound January  2015  No change in medications today  Office visit with [place] February  2015  [facility] plan for a screening EGD 2015

## 2013-10-07 ENCOUNTER — Encounter: Payer: Self-pay | Admitting: Internal Medicine

## 2013-10-07 NOTE — Progress Notes (Signed)
Pt is aware of OV on 2/26 at 10 with AS and appt card was mailed

## 2013-10-14 ENCOUNTER — Ambulatory Visit (HOSPITAL_COMMUNITY)
Admission: RE | Admit: 2013-10-14 | Discharge: 2013-10-14 | Disposition: A | Discharge status: Home / Self Care | Payer: Medicare Other | Source: Ambulatory Visit | Attending: Family Medicine | Admitting: Family Medicine

## 2013-10-14 DIAGNOSIS — Z9181 History of falling: Secondary | ICD-10-CM | POA: Insufficient documentation

## 2013-10-14 DIAGNOSIS — E119 Type 2 diabetes mellitus without complications: Secondary | ICD-10-CM | POA: Insufficient documentation

## 2013-10-14 DIAGNOSIS — R296 Repeated falls: Secondary | ICD-10-CM | POA: Insufficient documentation

## 2013-10-14 DIAGNOSIS — R269 Unspecified abnormalities of gait and mobility: Secondary | ICD-10-CM | POA: Insufficient documentation

## 2013-10-14 DIAGNOSIS — R2689 Other abnormalities of gait and mobility: Secondary | ICD-10-CM

## 2013-10-14 DIAGNOSIS — IMO0001 Reserved for inherently not codable concepts without codable children: Secondary | ICD-10-CM | POA: Insufficient documentation

## 2013-10-14 DIAGNOSIS — I10 Essential (primary) hypertension: Secondary | ICD-10-CM | POA: Insufficient documentation

## 2013-10-14 NOTE — Evaluation (Addendum)
Physical Therapy Evaluation  Patient Details  Name: [name] [name] MRN: [id number] Date of Birth: 1941/10/23  Today's Date: 10/14/2013 Time: 4098-1191 PT Time Calculation (min): 32 min              Visit#: 1 of 12  Re-eval: 11/13/13 Assessment Diagnosis: gt instability Prior Therapy: HH for two months  Authorization: medicare     Authorization Visit#: 1 of 10   Past Medical History:  Past Medical History  Diagnosis Date  . CAD in native artery   . Thrombocytopenia, unspecified   . Unspecified hereditary and idiopathic peripheral neuropathy   . Unspecified fall   . Other and unspecified hyperlipidemia   . Unspecified essential hypertension   . Obesity, unspecified   . Shortness of breath   . Personal history of neurosis   . Intrinsic asthma, unspecified   . Allergic rhinitis, cause unspecified   . Cirrhosis of liver     NASH, afp on 06/17/12 =3.7, per pt she had hep B vaccines in 1996, hep A status unknown  . Colon adenomas 03/08/10    tcs by Dr. Jena Gauss  . Hyperplastic polyps of stomach 03/08/10    tcs by Dr. Geni Bers  . Chronic gastritis 03/09/11    egd by Dr. Jena Gauss  . History of hemorrhoids 03/08/10    tcs- internal and external  . Diverticula of colon 03/08/10    L side  . Hiatal hernia 03/08/10  . Esophagitis, erosive 03/08/10  . GERD (gastroesophageal reflux disease)   . Wears glasses   . Type II or unspecified type diabetes mellitus without mention of complication, not stated as uncontrolled   . Vertigo   . Pancreatitis   . Bilateral leg weakness    Past Surgical History:  Past Surgical History  Procedure Laterality Date  . Hysterectomy and btl      s/p  . Abdominal hysterectomy    . Tumor excision  2003    rt arm and left foot  . Colonoscopy  03/08/10    Dr. [name]-->ext/int hemorrhoids, anal paipilla, rectal polyps, desc polyps, cecal polyp, left-sided diverticula. suboptiomal prep. next TCS 02/2015. multiple adenomas  . Esophagogastroduodenoscopy  03/08/10    Dr.  Dellie Catholic erosive RE, small hh, antral erosions, small 1cm are of mucosal indentation along gastric body of doubtful significance, cystic nodularity of hypophyarynx, base of tongue, base of epiglottis. benign gastric biopsies  . Tubal ligation    . Esophagogastroduodenoscopy  10/08/2011    Dr. Dionisio David esophageal varices, antral erosion. next egd 09/2013  . Foot surgery  2003    lt foot  . Eye surgery      cataracts bilateral  . Orif humerus fracture Right 02/17/2013    Procedure: OPEN REDUCTION INTERNAL FIXATION (ORIF) RIGHT PROXIMAL HUMERUS FRACTURE;  Surgeon: Budd Palmer, MD;  Location: [facility] OR;  Service: Orthopedics;  Laterality: Right;  . Partial gastrectomy      family denies  . Breast surgery    . Cardiac catheterization  02/25/2007    Dr. Clarene Duke:  normal LV systolic function, mild irregularities of LAD    Subjective Symptoms/Limitations Symptoms: [name] [name] was admitted into [facility] on 10/19-10./21 20014 with hepatic encephalopathy. She states that she has been falling with her walker.   She was discharged with home health and progressed well.  She is now being referred to out-patient PT to maximize her functional ability.  Pertinent History: fell and fx Rt shoulder 02/2013 How long can you sit comfortably?: no problem How long  can you stand comfortably?: 7 minutes-tested How long can you walk comfortably?: 10 minutes with a walker.  Pain Assessment Currently in Pain?: Yes Pain Score: 5  Pain Location: Shoulder Pain Orientation: Right   Balance Screening  falling yes; multiple times    Sensation/Coordination/Flexibility/Functional Tests Functional Tests Functional Tests: sit to stand 7 with one UE assit with significant increased SOB Functional Tests: fotot intack 44 with risk adjusted fof 46  Assessment RLE Strength Right Hip Flexion: 5/5 Right Hip Extension: 3-/5 Right Hip ABduction: 3+/5 Right Hip ADduction: 5/5 Right Knee Flexion: 5/5 Right Knee Extension:  5/5 Right Ankle Dorsiflexion: 5/5 LLE Strength Left Hip Flexion: 5/5 Left Hip Extension: 3-/5 Left Hip ABduction: 3+/5 Left Hip ADduction: 5/5 Left Knee Flexion: 5/5 Left Knee Extension: 5/5 Left Ankle Dorsiflexion: 5/5  Exercise/Treatments Mobility/Balance  Berg Balance Test Sit to Stand: Able to stand  independently using hands Standing Unsupported: Able to stand safely 2 minutes Sitting with Back Unsupported but Feet Supported on Floor or Stool: Able to sit safely and securely 2 minutes Stand to Sit: Controls descent by using hands Transfers: Able to transfer safely, definite need of hands Standing Unsupported with Eyes Closed: Able to stand 10 seconds with supervision Standing Ubsupported with Feet Together: Able to place feet together independently and stand for 1 minute with supervision From Standing, Reach Forward with Outstretched Arm: Can reach forward >5 cm safely (2") From Standing Position, Pick up Object from Floor: Able to pick up shoe, needs supervision From Standing Position, Turn to Look Behind Over each Shoulder: Turn sideways only but maintains balance Turn 360 Degrees: Needs close supervision or verbal cueing Standing Unsupported, Alternately Place Feet on Step/Stool: Able to complete >2 steps/needs minimal assist Standing Unsupported, One Foot in Front: Needs help to step but can hold 15 seconds Standing on One Leg: Tries to lift leg/unable to hold 3 seconds but remains standing independently Total Score: 34     Physical Therapy Assessment and Plan PT Assessment and Plan Clinical Impression Statement: [name] [name] has a hx of encephalopathy and fractured Rt UE;she has been referred to PT to improve her gt.  Evaluation demonstrates weakened hip abductors and extensors as well as significant decrased balance.  PT will benefti from skilled PT to address the former deficits and maximize pt safety  Rehab Potential: Good PT Frequency: Min 3X/week PT Duration: 4  weeks PT Plan: begin balance activities ie tall marching, tandem stance, retro gt, sidestepping with t-band, SLS...    Goals Home Exercise Program Pt/caregiver will Perform Home Exercise Program: For increased strengthening PT Goal: Perform Home Exercise Program - Progress: Goal set today PT Short Term Goals PT Short Term Goal 1: Pt to be able to complete 10 sit to stand in 30 seconds using one hand hold for increased power to assist in hills and steps PT Short Term Goal 2: Pt to be ambulating with a walker x 15 minutes everyday PT Short Term Goal 3: Pt Berg to improve by 5 pt to improve safety and decrease risk of falling PT Long Term Goals Time to Complete Long Term Goals: 4 weeks PT Long Term Goal 1: Pt to be able to complet 5 sit to stand with no hand assist for improved balance and power PT Long Term Goal 2: Pt to be ambulating with a walker for 30 minutes. Long Term Goal 3: Berg score to be improved by 10 pts to improve safety and reduce risk of falling Long Term Goal 4: Pt to  have had no falls PT Long Term Goal 5: Pt to be doing light activites while standing ie dishes, folding clothes...  Problem List Patient Active Problem List   Diagnosis Date Noted  . Poor balance 10/14/2013  . Repeated falls 10/14/2013  . Acute encephalopathy 07/30/2013  . Encephalopathy, hepatic 07/30/2013  . Confusion 07/29/2013  . Pancreatitis 04/26/2013  . Edema of left lower extremity 04/14/2013  . Liver cirrhosis secondary to NASH 04/14/2013  . Generalized weakness 02/19/2013  . Acute blood loss anemia 02/18/2013  . UTI (urinary tract infection) 02/14/2013  . Encephalopathy acute 02/14/2013  . Hyponatremia 02/14/2013  . Hypokalemia 02/14/2013  . Fracture of humeral shaft, right, closed 02/11/2013  . Multiple falls 01/26/2013  . Difficulty walking 01/26/2013  . Bilateral leg weakness 01/26/2013  . Personal history of fall 09/02/2012  . Dizziness 09/02/2012  . Frequent falls 08/08/2012  .  Ataxia 08/08/2012  . Peripheral neuropathy 08/08/2012  . Preoperative clearance 06/06/2012  . 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 Anemia 04/08/2012  . Renal colic 03/26/2012  . Encephalopathy chronic 09/14/2011  . Posterior tibial tendon dysfunction 07/12/2011  . Hyperlipidemia 05/31/2011  . Essential hypertension 05/31/2011  . Uncontrolled type 2 diabetes mellitus with complication 05/31/2011  . GERD 09/29/2010  . Other chronic nonalcoholic liver disease 09/29/2010  . COLONIC POLYPS, HX OF 02/13/2010  . CIRRHOSIS 02/07/2010  . THROMBOCYTOPENIA, CHRONIC 01/04/2010  . PERIPHERAL NEUROPATHY 01/04/2010  . CAD, NATIVE VESSEL 01/04/2010  . ANXIETY DISORDER, HX OF 01/04/2010  . Intrinsic asthma, unspecified 03/16/2009  . ALLERGIC RHINITIS 02/18/2009  . DYSPNEA 02/18/2009  . DIABETES, TYPE 2 02/04/2009  . HYPERLIPIDEMIA 02/04/2009  . OBESITY 02/04/2009  . HYPERTENSION 02/04/2009    PT Plan of Care PT Home Exercise Plan: given  GP Functional Assessment Tool Used: foto Functional Limitation: Changing and maintaining body position Changing and Maintaining Body Position Current Status (Z6109): At least 40 percent but less than 60 percent impaired, limited or restricted Changing and Maintaining Body Position Goal Status (U0454): At least 20 percent but less than 40 percent impaired, limited or restricted  Aleksis Jiggetts,CINDY 10/14/2013, 3:18 PM  Physician Documentation Your signature is required to indicate approval of the treatment plan as stated above.  Please sign and either send electronically or make a copy of this report for your files and return this physician signed original.   Please mark one 1.__approve of plan  2. ___approve of plan with the following conditions.   ______________________________                                                          _____________________ Physician Signature                                                                                                              Date

## 2013-10-15 ENCOUNTER — Ambulatory Visit (HOSPITAL_COMMUNITY)
Admission: RE | Admit: 2013-10-15 | Discharge: 2013-10-15 | Disposition: A | Discharge status: Home / Self Care | Payer: Medicare Other | Source: Ambulatory Visit | Attending: Family Medicine | Admitting: Family Medicine

## 2013-10-15 NOTE — Progress Notes (Signed)
Physical Therapy Treatment Patient Details  Name: [name] [name] MRN: [id number] Date of Birth: [birth year]  Today's Date: 10/15/2013 Time: 1300-1355 PT Time Calculation (min): 55 min Visit#: 2 of 12  Re-eval: 11/13/13 Authorization: medicare  Authorization Visit#: 2 of 10  Charges:  therex 1300-1325 (25'), NMR 1327-1345 (18')  Subjective: Symptoms/Limitations Symptoms: Pt states she is doing well; continues to perform HEP.  Came into dept today with wheelchair. Pain Assessment Currently in Pain?: No/denies   Exercise/Treatments Balance Exercises Standing Tandem Stance: Eyes open;Hand held assist (HHA) 1;2 reps;15 secs SLS: Eyes open;Hand held assist (HHA) 1;3 reps Tandem Gait: 1 rep Retro Gait: 1 rep Sidestepping: 1 rep Marching: 10 reps Heel Raises: 10 reps Toe Raise: 10 reps  Seated Other Seated Exercises: NuStep 8' level 3 hills #2 Other Seated Exercises: STS 5X with UE's      Physical Therapy Assessment and Plan PT Assessment and Plan Clinical Impression Statement: Pt requires frequent rest breaks due to poor activity tolerance and fatigue.  Easily becomes unstable with difficulty self correcting.  Educated on general safety with transfers/gait.  Pt presents decreased safety awareness and impullsiivities. Rehab Potential: Good PT Frequency: Min 3X/week PT Duration: 4 weeks PT Plan: Progress dynamic balance, LE strength and overall safety awareness.     Problem List Patient Active Problem List   Diagnosis Date Noted  . Poor balance 10/14/2013  . Repeated falls 10/14/2013  . Acute encephalopathy 07/30/2013  . Encephalopathy, hepatic 07/30/2013  . Confusion 07/29/2013  . Pancreatitis 04/26/2013  . Edema of left lower extremity 04/14/2013  . Liver cirrhosis secondary to NASH 04/14/2013  . Generalized weakness 02/19/2013  . Acute blood loss anemia 02/18/2013  . UTI (urinary tract infection) 02/14/2013  . Encephalopathy acute 02/14/2013  . Hyponatremia  02/14/2013  . Hypokalemia 02/14/2013  . Fracture of humeral shaft, right, closed 02/11/2013  . Multiple falls 01/26/2013  . Difficulty walking 01/26/2013  . Bilateral leg weakness 01/26/2013  . Personal history of fall 09/02/2012  . Dizziness 09/02/2012  . Frequent falls 08/08/2012  . Ataxia 08/08/2012  . Peripheral neuropathy 08/08/2012  . Preoperative clearance 06/06/2012  . 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 Anemia 04/08/2012  . Renal colic 03/26/2012  . Encephalopathy chronic 09/14/2011  . Posterior tibial tendon dysfunction 07/12/2011  . Hyperlipidemia 05/31/2011  . Essential hypertension 05/31/2011  . Uncontrolled type 2 diabetes mellitus with complication 05/31/2011  . GERD 09/29/2010  . Other chronic nonalcoholic liver disease 09/29/2010  . COLONIC POLYPS, HX OF 02/13/2010  . CIRRHOSIS 02/07/2010  . THROMBOCYTOPENIA, CHRONIC 01/04/2010  . PERIPHERAL NEUROPATHY 01/04/2010  . CAD, NATIVE VESSEL 01/04/2010  . ANXIETY DISORDER, HX OF 01/04/2010  . Intrinsic asthma, unspecified 03/16/2009  . ALLERGIC RHINITIS 02/18/2009  . DYSPNEA 02/18/2009  . DIABETES, TYPE 2 02/04/2009  . HYPERLIPIDEMIA 02/04/2009  . OBESITY 02/04/2009  . HYPERTENSION 02/04/2009        Lurena Nida, PTA/CLT 10/15/2013, 2:04 PM

## 2013-10-19 ENCOUNTER — Ambulatory Visit (HOSPITAL_COMMUNITY)
Admission: RE | Admit: 2013-10-19 | Discharge: 2013-10-19 | Disposition: A | Discharge status: Home / Self Care | Payer: Medicare Other | Source: Ambulatory Visit | Attending: Family Medicine | Admitting: Family Medicine

## 2013-10-19 NOTE — Progress Notes (Signed)
Physical Therapy Treatment Patient Details  Name: [name] [name] MRN: [id number] Date of Birth: 1941/09/05  Today's Date: 10/19/2013 Time: 7829-5621 PT Time Calculation (min): 43 min Charges: NMR x 30'(8657-8469) Therex x 62'(9528-4132)  Visit#: 3 of 12  Re-eval: 11/13/13  Authorization: medicare  Authorization Visit#: 3 of 10   Subjective: Symptoms/Limitations Symptoms: Pt reports continued HEP compliance. Pain Assessment Currently in Pain?: Yes Pain Score: 8  Pain Location: Shoulder Pain Orientation: Right  Exercise/Treatments Balance Exercises Standing Standing Eyes Opened: Narrow base of support (BOS);30 secs;Solid surface Tandem Stance: Eyes open;Intermittent HHA;1 rep;Limitations Tandem Stance Limitations: 1' Wall Bumps: Shoulder;Hips;10 reps Tandem Gait: 1 rep Retro Gait: 1 rep Sidestepping: 1 rep Marching: 10 reps Heel Raises: 10 reps Toe Raise: 10 reps  Seated Other Seated Exercises: NuStep 10' level 3 hills #2 to improve strength and activity tolerance Other Seated Exercises: STS 5X without UE assistance   Physical Therapy Assessment and Plan PT Assessment and Plan Clinical Impression Statement: Pt's activity tolerance appears to be increasing. Pt requires only 3 seated rest breaks this session. Pt is able to tolerate 10' on NuStep this session without stopping. Progressed NMR exercises with, multimodal cueing to improve concentration, engage core and find center. Rehab Potential: Good PT Frequency: Min 3X/week PT Duration: 4 weeks PT Plan: Continue to progress dynamic balance, LE strength and overall safety awareness.    Problem List Patient Active Problem List   Diagnosis Date Noted  . Poor balance 10/14/2013  . Repeated falls 10/14/2013  . Acute encephalopathy 07/30/2013  . Encephalopathy, hepatic 07/30/2013  . Confusion 07/29/2013  . Pancreatitis 04/26/2013  . Edema of left lower extremity 04/14/2013  . Liver cirrhosis secondary to NASH  04/14/2013  . Generalized weakness 02/19/2013  . Acute blood loss anemia 02/18/2013  . UTI (urinary tract infection) 02/14/2013  . Encephalopathy acute 02/14/2013  . Hyponatremia 02/14/2013  . Hypokalemia 02/14/2013  . Fracture of humeral shaft, right, closed 02/11/2013  . Multiple falls 01/26/2013  . Difficulty walking 01/26/2013  . Bilateral leg weakness 01/26/2013  . Personal history of fall 09/02/2012  . Dizziness 09/02/2012  . Frequent falls 08/08/2012  . Ataxia 08/08/2012  . Peripheral neuropathy 08/08/2012  . Preoperative clearance 06/06/2012  . 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 Anemia 04/08/2012  . Renal colic 03/26/2012  . Encephalopathy chronic 09/14/2011  . Posterior tibial tendon dysfunction 07/12/2011  . Hyperlipidemia 05/31/2011  . Essential hypertension 05/31/2011  . Uncontrolled type 2 diabetes mellitus with complication 05/31/2011  . GERD 09/29/2010  . Other chronic nonalcoholic liver disease 09/29/2010  . COLONIC POLYPS, HX OF 02/13/2010  . CIRRHOSIS 02/07/2010  . THROMBOCYTOPENIA, CHRONIC 01/04/2010  . PERIPHERAL NEUROPATHY 01/04/2010  . CAD, NATIVE VESSEL 01/04/2010  . ANXIETY DISORDER, HX OF 01/04/2010  . Intrinsic asthma, unspecified 03/16/2009  . ALLERGIC RHINITIS 02/18/2009  . DYSPNEA 02/18/2009  . DIABETES, TYPE 2 02/04/2009  . HYPERLIPIDEMIA 02/04/2009  . OBESITY 02/04/2009  . HYPERTENSION 02/04/2009    PT - End of Session Activity Tolerance: Patient tolerated treatment well General Behavior During Therapy: [facility] for tasks assessed/performed  Seth Bake, PTA 10/19/2013, 1:57 PM

## 2013-10-21 ENCOUNTER — Inpatient Hospital Stay (HOSPITAL_COMMUNITY): Admission: RE | Admit: 2013-10-21 | Payer: Medicare Other | Source: Ambulatory Visit | Admitting: *Deleted

## 2013-10-22 ENCOUNTER — Ambulatory Visit (HOSPITAL_COMMUNITY)
Admission: RE | Admit: 2013-10-22 | Discharge: 2013-10-22 | Disposition: A | Discharge status: Home / Self Care | Payer: Medicare Other | Source: Ambulatory Visit | Attending: Family Medicine | Admitting: Family Medicine

## 2013-10-22 NOTE — Progress Notes (Signed)
Physical Therapy Treatment Patient Details  Name: [name] [name] MRN: [id number] Date of Birth: 1941-08-25  Today's Date: 10/22/2013 Time: 1312-1355 PT Time Calculation (min): 43 min Charges: NMR x 40'(9811-9147) Therex x 15'(1340-1355)  Visit#: 4 of 12  Re-eval: 11/13/13  Authorization: Medicare  Authorization Visit#: 4 of 10   Subjective: Symptoms/Limitations Symptoms: Pt states that she is getting up 5-6 x per day for 10-15' at a time. Pain Assessment Currently in Pain?: Yes Pain Score: 6  Pain Location: Shoulder Pain Orientation: Right  Exercise/Treatments Balance Exercises Standing Standing Eyes Closed: Narrow base of support (BOS);10 secs;3 reps Tandem Stance: Eyes open;Intermittent HHA;1 rep;Limitations Tandem Stance Limitations: 1' Standing, One Foot on a Step: 6 inch;Eyes open;1 rep;30 secs Tandem Gait: 2 reps Retro Gait: 2 reps Sidestepping: 2 reps Marching: 10 reps Heel Raises: 10 reps Toe Raise: 10 reps Other Standing Exercises: Standing with manual perturbations from therapist  Seated Other Seated Exercises: NuStep 10' level 3 hills #2 SPM at 80 to improve LE strength and activity tolerance   Physical Therapy Assessment and Plan PT Assessment and Plan Clinical Impression Statement: Pt's activity tolerance continues to improve. Pt continues to require frequent cuing to improve focus. Pt displays decreased LOB with tandem gait this session. Pt appears to be progressing well toward goals. Rehab Potential: Good PT Frequency: Min 3X/week PT Duration: 4 weeks PT Plan: Continue to progress dynamic balance, LE strength and overall safety awareness.    Problem List Patient Active Problem List   Diagnosis Date Noted  . Poor balance 10/14/2013  . Repeated falls 10/14/2013  . Acute encephalopathy 07/30/2013  . Encephalopathy, hepatic 07/30/2013  . Confusion 07/29/2013  . Pancreatitis 04/26/2013  . Edema of left lower extremity 04/14/2013  . Liver cirrhosis  secondary to NASH 04/14/2013  . Generalized weakness 02/19/2013  . Acute blood loss anemia 02/18/2013  . UTI (urinary tract infection) 02/14/2013  . Encephalopathy acute 02/14/2013  . Hyponatremia 02/14/2013  . Hypokalemia 02/14/2013  . Fracture of humeral shaft, right, closed 02/11/2013  . Multiple falls 01/26/2013  . Difficulty walking 01/26/2013  . Bilateral leg weakness 01/26/2013  . Personal history of fall 09/02/2012  . Dizziness 09/02/2012  . Frequent falls 08/08/2012  . Ataxia 08/08/2012  . Peripheral neuropathy 08/08/2012  . Preoperative clearance 06/06/2012  . 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 Anemia 04/08/2012  . Renal colic 03/26/2012  . Encephalopathy chronic 09/14/2011  . Posterior tibial tendon dysfunction 07/12/2011  . Hyperlipidemia 05/31/2011  . Essential hypertension 05/31/2011  . Uncontrolled type 2 diabetes mellitus with complication 05/31/2011  . GERD 09/29/2010  . Other chronic nonalcoholic liver disease 09/29/2010  . COLONIC POLYPS, HX OF 02/13/2010  . CIRRHOSIS 02/07/2010  . THROMBOCYTOPENIA, CHRONIC 01/04/2010  . PERIPHERAL NEUROPATHY 01/04/2010  . CAD, NATIVE VESSEL 01/04/2010  . ANXIETY DISORDER, HX OF 01/04/2010  . Intrinsic asthma, unspecified 03/16/2009  . ALLERGIC RHINITIS 02/18/2009  . DYSPNEA 02/18/2009  . DIABETES, TYPE 2 02/04/2009  . HYPERLIPIDEMIA 02/04/2009  . OBESITY 02/04/2009  . HYPERTENSION 02/04/2009    PT - End of Session Activity Tolerance: Patient tolerated treatment well General Behavior During Therapy: [facility] for tasks assessed/performed  Seth Bake, PTA  10/22/2013, 2:08 PM

## 2013-10-26 ENCOUNTER — Ambulatory Visit (HOSPITAL_COMMUNITY)
Admission: RE | Admit: 2013-10-26 | Discharge: 2013-10-26 | Disposition: A | Discharge status: Home / Self Care | Payer: Medicare Other | Source: Ambulatory Visit | Attending: Family Medicine | Admitting: Family Medicine

## 2013-10-26 NOTE — Progress Notes (Signed)
Physical Therapy Treatment Patient Details  Name: [name] [name] MRN: [id number] Date of Birth: 1941/01/03  Today's Date: 10/26/2013 Time: 4098-1191 PT Time Calculation (min): 52 min Charges: NMR x 30'(0850-0920) Therex x 47'(8295-6213)  Visit#: 6 of 12  Re-eval: 11/13/13  Authorization: Medicare  Authorization Visit#: 6 of 10   Subjective: Symptoms/Limitations Symptoms: Pt states that she fell yesterday while doing laundry. She reports no injury from fall. Pain Assessment Currently in Pain?: No/denies   Exercise/Treatments  Balance Exercises Standing Standing Eyes Closed: Narrow base of support (BOS);10 secs;5 reps Tandem Stance: Eyes open;Intermittent HHA;1 rep;Limitations Tandem Stance Limitations: 1' Standing, One Foot on a Step: 6 inch;Eyes open;1 rep;30 secs Marching: 10 reps Heel Raises: 10 reps Toe Raise: 10 reps Other Standing Exercises: Rocker board x 2'  Seated Other Seated Exercises: NuStep 10' level 3 hills #2 SPM at 80 to improve LE strength and activity tolerance   Physical Therapy Assessment and Plan PT Assessment and Plan Clinical Impression Statement: Pt displays decreased activity tolerance this session. Pt requires frequent rest breaks throughout session. Pt attributes this to falling yesterday. Visible muscle fatigued noted with standing activities.  Rehab Potential: Good PT Frequency: Min 3X/week PT Duration: 4 weeks PT Plan: Continue to progress dynamic balance, LE strength and overall safety awareness.    Problem List Patient Active Problem List   Diagnosis Date Noted  . Poor balance 10/14/2013  . Repeated falls 10/14/2013  . Acute encephalopathy 07/30/2013  . Encephalopathy, hepatic 07/30/2013  . Confusion 07/29/2013  . Pancreatitis 04/26/2013  . Edema of left lower extremity 04/14/2013  . Liver cirrhosis secondary to NASH 04/14/2013  . Generalized weakness 02/19/2013  . Acute blood loss anemia 02/18/2013  . UTI (urinary tract  infection) 02/14/2013  . Encephalopathy acute 02/14/2013  . Hyponatremia 02/14/2013  . Hypokalemia 02/14/2013  . Fracture of humeral shaft, right, closed 02/11/2013  . Multiple falls 01/26/2013  . Difficulty walking 01/26/2013  . Bilateral leg weakness 01/26/2013  . Personal history of fall 09/02/2012  . Dizziness 09/02/2012  . Frequent falls 08/08/2012  . Ataxia 08/08/2012  . Peripheral neuropathy 08/08/2012  . Preoperative clearance 06/06/2012  . 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 Anemia 04/08/2012  . Renal colic 03/26/2012  . Encephalopathy chronic 09/14/2011  . Posterior tibial tendon dysfunction 07/12/2011  . Hyperlipidemia 05/31/2011  . Essential hypertension 05/31/2011  . Uncontrolled type 2 diabetes mellitus with complication 05/31/2011  . GERD 09/29/2010  . Other chronic nonalcoholic liver disease 09/29/2010  . COLONIC POLYPS, HX OF 02/13/2010  . CIRRHOSIS 02/07/2010  . THROMBOCYTOPENIA, CHRONIC 01/04/2010  . PERIPHERAL NEUROPATHY 01/04/2010  . CAD, NATIVE VESSEL 01/04/2010  . ANXIETY DISORDER, HX OF 01/04/2010  . Intrinsic asthma, unspecified 03/16/2009  . ALLERGIC RHINITIS 02/18/2009  . DYSPNEA 02/18/2009  . DIABETES, TYPE 2 02/04/2009  . HYPERLIPIDEMIA 02/04/2009  . OBESITY 02/04/2009  . HYPERTENSION 02/04/2009    PT - End of Session Activity Tolerance: Patient tolerated treatment well General Behavior During Therapy: [facility] for tasks assessed/performed  Seth Bake, PTA  10/26/2013, 9:48 AM

## 2013-10-28 ENCOUNTER — Ambulatory Visit (HOSPITAL_COMMUNITY)
Admission: RE | Admit: 2013-10-28 | Discharge: 2013-10-28 | Disposition: A | Discharge status: Home / Self Care | Payer: Medicare Other | Source: Ambulatory Visit | Attending: Family Medicine | Admitting: Family Medicine

## 2013-10-28 NOTE — Progress Notes (Signed)
Physical Therapy Treatment Patient Details  Name: [name] [name] MRN: [id number] Date of Birth: Nov 04, 1941  Today's Date: 10/28/2013 Time: 4098-1191 PT Time Calculation (min): 42 min Charges: NMR x 38'  Visit#: 7 of 12  Re-eval: 11/13/13  Authorization: Medicare   Authorization Visit#: 7 of 10   Subjective: Symptoms/Limitations Symptoms: Pt states that she lost her balance today transferring out of her wheel chair. She states that she landed on the couch. Pain Assessment Currently in Pain?: No/denies  Exercise/Treatments Balance Exercises Standing Standing Eyes Opened: Limitations Standing Eyes Opened Limitations: Focusing on equalizing weight distribution and finding COG Tandem Stance: Eyes open;Intermittent HHA;1 rep;Limitations Tandem Stance Limitations: 2x30" bilateral Standing, One Foot on a Step: 6 inch;Eyes open;1 rep;30 secs Other Standing Exercises: Weight shift A/P R/L; figure eight  Seated     Physical Therapy Assessment and Plan PT Assessment and Plan Clinical Impression Statement: Therapist facilitated activities to improve proprioceptive control, activity tolerance, and functional strength. Pt continues to require frequent cueing throughout session to improve concentration. Encouraged pt to think out motor tasks before performing them to limit chance of falling. Pt requires multimodal cueing to engage core and hip musculature to maintain balance with NMR activities.  Rehab Potential: Good PT Frequency: Min 3X/week PT Duration: 4 weeks PT Plan: Continue to progress dynamic balance, LE strength and overall safety awareness.    Problem List Patient Active Problem List   Diagnosis Date Noted  . Poor balance 10/14/2013  . Repeated falls 10/14/2013  . Acute encephalopathy 07/30/2013  . Encephalopathy, hepatic 07/30/2013  . Confusion 07/29/2013  . Pancreatitis 04/26/2013  . Edema of left lower extremity 04/14/2013  . Liver cirrhosis secondary to NASH  04/14/2013  . Generalized weakness 02/19/2013  . Acute blood loss anemia 02/18/2013  . UTI (urinary tract infection) 02/14/2013  . Encephalopathy acute 02/14/2013  . Hyponatremia 02/14/2013  . Hypokalemia 02/14/2013  . Fracture of humeral shaft, right, closed 02/11/2013  . Multiple falls 01/26/2013  . Difficulty walking 01/26/2013  . Bilateral leg weakness 01/26/2013  . Personal history of fall 09/02/2012  . Dizziness 09/02/2012  . Frequent falls 08/08/2012  . Ataxia 08/08/2012  . Peripheral neuropathy 08/08/2012  . Preoperative clearance 06/06/2012  . 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 Anemia 04/08/2012  . Renal colic 03/26/2012  . Encephalopathy chronic 09/14/2011  . Posterior tibial tendon dysfunction 07/12/2011  . Hyperlipidemia 05/31/2011  . Essential hypertension 05/31/2011  . Uncontrolled type 2 diabetes mellitus with complication 05/31/2011  . GERD 09/29/2010  . Other chronic nonalcoholic liver disease 09/29/2010  . COLONIC POLYPS, HX OF 02/13/2010  . CIRRHOSIS 02/07/2010  . THROMBOCYTOPENIA, CHRONIC 01/04/2010  . PERIPHERAL NEUROPATHY 01/04/2010  . CAD, NATIVE VESSEL 01/04/2010  . ANXIETY DISORDER, HX OF 01/04/2010  . Intrinsic asthma, unspecified 03/16/2009  . ALLERGIC RHINITIS 02/18/2009  . DYSPNEA 02/18/2009  . DIABETES, TYPE 2 02/04/2009  . HYPERLIPIDEMIA 02/04/2009  . OBESITY 02/04/2009  . HYPERTENSION 02/04/2009    PT - End of Session Activity Tolerance: Patient tolerated treatment well General Behavior During Therapy: [facility] for tasks assessed/performed  Seth Bake, PTA  10/28/2013, 5:01 PM

## 2013-10-29 ENCOUNTER — Ambulatory Visit (HOSPITAL_COMMUNITY): Payer: Medicare Other | Admitting: *Deleted

## 2013-10-29 ENCOUNTER — Encounter: Payer: Self-pay | Admitting: Gastroenterology

## 2013-10-29 ENCOUNTER — Other Ambulatory Visit: Payer: Self-pay | Admitting: Gastroenterology

## 2013-10-29 ENCOUNTER — Telehealth: Payer: Self-pay | Admitting: Internal Medicine

## 2013-10-29 NOTE — Telephone Encounter (Signed)
Jan recall has that patient needs Liver U/S in January

## 2013-10-29 NOTE — Telephone Encounter (Signed)
Letter has been mailed to patient

## 2013-11-02 ENCOUNTER — Ambulatory Visit (HOSPITAL_COMMUNITY)
Admission: RE | Admit: 2013-11-02 | Discharge: 2013-11-02 | Disposition: A | Discharge status: Home / Self Care | Payer: Medicare Other | Source: Ambulatory Visit | Attending: Family Medicine | Admitting: Family Medicine

## 2013-11-02 NOTE — Progress Notes (Signed)
Physical Therapy Treatment Patient Details  Name: [name] [name] MRN: [id number] Date of Birth: 12-Jan-1941  Today's Date: 11/02/2013 Time: 1354-1446 PT Time Calculation (min): 52 min  Visit#: 8 of 12  Re-eval: 11/13/13 Authorization: Medicare  Authorization Visit#: 8 of 10  Charges:  NMR 1354-1410 (16'), therex 1414-1446 (32')  Subjective: Pt reports no falls.  States she feels she is getting stronger.  Continues to use wheelchair.  Exercise/Treatments Balance Exercises Standing Tandem Gait: 2 reps Retro Gait: 2 reps Sidestepping: 2 reps Marching: 15 reps Heel Raises: 15 reps Toe Raise: 15 reps  Seated Other Seated Exercises: NuStep 10' level 3 hills #2 SPM at 80 to improve LE strength and activity tolerance at end of session  Physical Therapy Assessment and Plan PT Assessment and Plan Clinical Impression Statement: Improved ability to maintain balance with all activities today.  Retro ambulation most difficult with Lt lateral instability.  Pt  continues to have decreased safety awareness, requiring therapist facilitation and cues to complete.   Pt required 4, 1 minute seated rest breaks today during session Rehab Potential: Good PT Frequency: Min 3X/week PT Duration: 4 weeks PT Plan: Continue to progress dynamic balance, LE strength and overall safety awareness.     Problem List Patient Active Problem List   Diagnosis Date Noted  . Poor balance 10/14/2013  . Repeated falls 10/14/2013  . Acute encephalopathy 07/30/2013  . Encephalopathy, hepatic 07/30/2013  . Confusion 07/29/2013  . Pancreatitis 04/26/2013  . Edema of left lower extremity 04/14/2013  . Liver cirrhosis secondary to NASH 04/14/2013  . Generalized weakness 02/19/2013  . Acute blood loss anemia 02/18/2013  . UTI (urinary tract infection) 02/14/2013  . Encephalopathy acute 02/14/2013  . Hyponatremia 02/14/2013  . Hypokalemia 02/14/2013  . Fracture of humeral shaft, right, closed 02/11/2013  .  Multiple falls 01/26/2013  . Difficulty walking 01/26/2013  . Bilateral leg weakness 01/26/2013  . Personal history of fall 09/02/2012  . Dizziness 09/02/2012  . Frequent falls 08/08/2012  . Ataxia 08/08/2012  . Peripheral neuropathy 08/08/2012  . Preoperative clearance 06/06/2012  . 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 Anemia 04/08/2012  . Renal colic 03/26/2012  . Encephalopathy chronic 09/14/2011  . Posterior tibial tendon dysfunction 07/12/2011  . Hyperlipidemia 05/31/2011  . Essential hypertension 05/31/2011  . Uncontrolled type 2 diabetes mellitus with complication 05/31/2011  . GERD 09/29/2010  . Other chronic nonalcoholic liver disease 09/29/2010  . COLONIC POLYPS, HX OF 02/13/2010  . CIRRHOSIS 02/07/2010  . THROMBOCYTOPENIA, CHRONIC 01/04/2010  . PERIPHERAL NEUROPATHY 01/04/2010  . CAD, NATIVE VESSEL 01/04/2010  . ANXIETY DISORDER, HX OF 01/04/2010  . Intrinsic asthma, unspecified 03/16/2009  . ALLERGIC RHINITIS 02/18/2009  . DYSPNEA 02/18/2009  . DIABETES, TYPE 2 02/04/2009  . HYPERLIPIDEMIA 02/04/2009  . OBESITY 02/04/2009  . HYPERTENSION 02/04/2009    PT - End of Session Equipment Utilized During Treatment: Gait belt Activity Tolerance: Patient tolerated treatment well General Behavior During Therapy: [facility] for tasks assessed/performed   Lurena Nida, PTA/CLT 11/02/2013, 3:01 PM

## 2013-11-03 ENCOUNTER — Ambulatory Visit (HOSPITAL_COMMUNITY)
Admission: RE | Admit: 2013-11-03 | Discharge: 2013-11-03 | Disposition: A | Discharge status: Home / Self Care | Payer: Medicare Other | Source: Ambulatory Visit | Attending: Family Medicine | Admitting: Family Medicine

## 2013-11-03 NOTE — Progress Notes (Signed)
Physical Therapy Treatment Patient Details  Name: [name] [name] MRN: [id number] Date of Birth: 1941/05/30  Today's Date: 11/03/2013 Time: 4098-1191 PT Time Calculation (min): 48 min  Visit#: 9 of 12  Re-eval: 11/13/13 Authorization: Medicare  Authorization Visit#: 9 of 10  Charges:  NMR 1306-1330 (24'), therex 1335-1354 (14')  Subjective: Pt states she is doing well today without pain or difficulty.  Exercise/Treatments Balance Exercises Standing Tandem Gait: 2 reps Retro Gait: 2 reps Sidestepping: 2 reps Cone Rotation: Foam;Right turn;Left turn Marching: 20 reps Heel Raises: 20 reps Toe Raise: 20 reps Sit to Stand: Standard surface;Limitations Sit to Stand Limitations: no UE's 5 reps Other Standing Exercises: hip abduction 10 reps each  Seated Other Seated Exercises: NuStep 10' level 3 hills #2 SPM at 80 to improve LE strength and activity tolerance      Physical Therapy Assessment and Plan PT Assessment and Plan Clinical Impression Statement: Added cone rotational task while standing on foam.  Pt with several LOB, mostly to Lt and unable to correct without UE's and therapist assistance.  Pt required 3 seated rest breaks today during session.  Worked on sit to stand with controlled movement as pt tends to plop in chair when sitting. Rehab Potential: Good PT Frequency: Min 3X/week PT Duration: 4 weeks PT Plan: Continue to progress dynamic balance, LE strength and overall safety awareness.  G code update next visit.    Goals    Problem List Patient Active Problem List   Diagnosis Date Noted  . Poor balance 10/14/2013  . Repeated falls 10/14/2013  . Acute encephalopathy 07/30/2013  . Encephalopathy, hepatic 07/30/2013  . Confusion 07/29/2013  . Pancreatitis 04/26/2013  . Edema of left lower extremity 04/14/2013  . Liver cirrhosis secondary to NASH 04/14/2013  . Generalized weakness 02/19/2013  . Acute blood loss anemia 02/18/2013  . UTI (urinary tract  infection) 02/14/2013  . Encephalopathy acute 02/14/2013  . Hyponatremia 02/14/2013  . Hypokalemia 02/14/2013  . Fracture of humeral shaft, right, closed 02/11/2013  . Multiple falls 01/26/2013  . Difficulty walking 01/26/2013  . Bilateral leg weakness 01/26/2013  . Personal history of fall 09/02/2012  . Dizziness 09/02/2012  . Frequent falls 08/08/2012  . Ataxia 08/08/2012  . Peripheral neuropathy 08/08/2012  . Preoperative clearance 06/06/2012  . 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 Anemia 04/08/2012  . Renal colic 03/26/2012  . Encephalopathy chronic 09/14/2011  . Posterior tibial tendon dysfunction 07/12/2011  . Hyperlipidemia 05/31/2011  . Essential hypertension 05/31/2011  . Uncontrolled type 2 diabetes mellitus with complication 05/31/2011  . GERD 09/29/2010  . Other chronic nonalcoholic liver disease 09/29/2010  . COLONIC POLYPS, HX OF 02/13/2010  . CIRRHOSIS 02/07/2010  . THROMBOCYTOPENIA, CHRONIC 01/04/2010  . PERIPHERAL NEUROPATHY 01/04/2010  . CAD, NATIVE VESSEL 01/04/2010  . ANXIETY DISORDER, HX OF 01/04/2010  . Intrinsic asthma, unspecified 03/16/2009  . ALLERGIC RHINITIS 02/18/2009  . DYSPNEA 02/18/2009  . DIABETES, TYPE 2 02/04/2009  . HYPERLIPIDEMIA 02/04/2009  . OBESITY 02/04/2009  . HYPERTENSION 02/04/2009    PT - End of Session Equipment Utilized During Treatment: Gait belt Activity Tolerance: Patient tolerated treatment well General Behavior During Therapy: [facility] for tasks assessed/performed   Lurena Nida, PTA/CLT 11/03/2013, 1:48 PM

## 2013-11-09 ENCOUNTER — Ambulatory Visit (HOSPITAL_COMMUNITY): Payer: Medicare Other | Admitting: Physical Therapy

## 2013-11-11 ENCOUNTER — Ambulatory Visit (HOSPITAL_COMMUNITY)
Admission: RE | Admit: 2013-11-11 | Discharge: 2013-11-11 | Disposition: A | Discharge status: Home / Self Care | Payer: Medicare Other | Source: Ambulatory Visit | Attending: Family Medicine | Admitting: Family Medicine

## 2013-11-11 NOTE — Progress Notes (Signed)
Physical Therapy Treatment Patient Details  Name: [name] [name] MRN: [id number] Date of Birth: July 17, 1941  Today's Date: 11/11/2013 Time: 4098-1191 PT Time Calculation (min): 60 min Charges: NMR x 47'(8295-6213) Self-care x 10'(1013-1023)  Visit#: 9 of 12  Re-eval: 11/13/13  Authorization: Medicare  Authorization Visit#: 9 of 10   Subjective: Symptoms/Limitations Symptoms: Pt reports no falls since last session. Pain Assessment Currently in Pain?: No/denies   Exercise/Treatments Balance Exercises Standing Standing Eyes Opened: Narrow base of support (BOS);Head turns;Limitations Standing Eyes Opened Limitations: 10 reps up/down right/left Wall Bumps: Shoulder;Hips;10 reps Marching: 20 reps Heel Raises: 20 reps Sit to Stand: Standard surface;Limitations Sit to Stand Limitations: no UE's 5 reps  Seated Other Seated Exercises: NuStep 10' level 3 hills #3 SPM at 80 to improve LE strength and activity tolerance   Physical Therapy Assessment and Plan PT Assessment and Plan Clinical Impression Statement: Therapist facilitated activities to improve proprioceptive awareness to improve safety and awareness. FOTO score has improved from 45 to 47.  Increased inflammation and decrease motion noted in RUE. After consulting Leia Alf, OT occupational order was sent to MD. Rehab Potential: Good PT Frequency: Min 3X/week PT Duration: 4 weeks PT Plan: Continue to progress dynamic balance, LE strength and overall safety awareness.      Problem List Patient Active Problem List   Diagnosis Date Noted  . Poor balance 10/14/2013  . Repeated falls 10/14/2013  . Acute encephalopathy 07/30/2013  . Encephalopathy, hepatic 07/30/2013  . Confusion 07/29/2013  . Pancreatitis 04/26/2013  . Edema of left lower extremity 04/14/2013  . Liver cirrhosis secondary to NASH 04/14/2013  . Generalized weakness 02/19/2013  . Acute blood loss anemia 02/18/2013  . UTI (urinary tract infection)  02/14/2013  . Encephalopathy acute 02/14/2013  . Hyponatremia 02/14/2013  . Hypokalemia 02/14/2013  . Fracture of humeral shaft, right, closed 02/11/2013  . Multiple falls 01/26/2013  . Difficulty walking 01/26/2013  . Bilateral leg weakness 01/26/2013  . Personal history of fall 09/02/2012  . Dizziness 09/02/2012  . Frequent falls 08/08/2012  . Ataxia 08/08/2012  . Peripheral neuropathy 08/08/2012  . Preoperative clearance 06/06/2012  . 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 Anemia 04/08/2012  . Renal colic 03/26/2012  . Encephalopathy chronic 09/14/2011  . Posterior tibial tendon dysfunction 07/12/2011  . Hyperlipidemia 05/31/2011  . Essential hypertension 05/31/2011  . Uncontrolled type 2 diabetes mellitus with complication 05/31/2011  . GERD 09/29/2010  . Other chronic nonalcoholic liver disease 09/29/2010  . COLONIC POLYPS, HX OF 02/13/2010  . CIRRHOSIS 02/07/2010  . THROMBOCYTOPENIA, CHRONIC 01/04/2010  . PERIPHERAL NEUROPATHY 01/04/2010  . CAD, NATIVE VESSEL 01/04/2010  . ANXIETY DISORDER, HX OF 01/04/2010  . Intrinsic asthma, unspecified 03/16/2009  . ALLERGIC RHINITIS 02/18/2009  . DYSPNEA 02/18/2009  . DIABETES, TYPE 2 02/04/2009  . HYPERLIPIDEMIA 02/04/2009  . OBESITY 02/04/2009  . HYPERTENSION 02/04/2009    PT - End of Session Equipment Utilized During Treatment: Gait belt Activity Tolerance: Patient tolerated treatment well General Behavior During Therapy: WFL for tasks assessed/performed  GP Functional Assessment Tool Used: foto 47% status 53% limitation Functional Limitation: Changing and maintaining body position Changing and Maintaining Body Position Current Status (Y8657): At least 40 percent but less than 60 percent impaired, limited or restricted Changing and Maintaining Body Position Goal Status (Q4696): At least 20 percent but less than 40 percent impaired, limited or restricted   Seth Bake, PTA  11/11/2013, 10:44  AM

## 2013-11-13 ENCOUNTER — Ambulatory Visit (HOSPITAL_COMMUNITY)
Admission: RE | Admit: 2013-11-13 | Discharge: 2013-11-13 | Disposition: A | Discharge status: Home / Self Care | Payer: Medicare Other | Source: Ambulatory Visit | Attending: Family Medicine | Admitting: Family Medicine

## 2013-11-13 DIAGNOSIS — E119 Type 2 diabetes mellitus without complications: Secondary | ICD-10-CM | POA: Insufficient documentation

## 2013-11-13 DIAGNOSIS — R296 Repeated falls: Secondary | ICD-10-CM

## 2013-11-13 DIAGNOSIS — R2689 Other abnormalities of gait and mobility: Secondary | ICD-10-CM

## 2013-11-13 DIAGNOSIS — Z9181 History of falling: Secondary | ICD-10-CM | POA: Insufficient documentation

## 2013-11-13 DIAGNOSIS — R269 Unspecified abnormalities of gait and mobility: Secondary | ICD-10-CM | POA: Insufficient documentation

## 2013-11-13 DIAGNOSIS — I10 Essential (primary) hypertension: Secondary | ICD-10-CM | POA: Insufficient documentation

## 2013-11-13 DIAGNOSIS — IMO0001 Reserved for inherently not codable concepts without codable children: Secondary | ICD-10-CM | POA: Insufficient documentation

## 2013-11-13 NOTE — Progress Notes (Signed)
Physical Therapy Treatment Patient Details  Name: [name] [name] MRN: [id number] Date of Birth: July 30, 1941  Today's Date: 11/13/2013 Time: 1515-1610 PT Time Calculation (min): 55 min Charge neuro-reed [id number]; there ex [id number] Visit#: 10 of 12  Re-eval: 11/13/13    Authorization: Medicare  Authorization Visit#: 10 of 19   Subjective: Symptoms/Limitations Symptoms: Pt states she has been walkng alot more at home but wants to do more. Pain Assessment Pain Score: 5  Pain Location: Arm   Exercise/Treatments     Balance Exercises Standing Tandem Stance Limitations: tandem stance 30' x 2  Wall Bumps: Shoulder;Hips;10 reps Tandem Gait: 2 reps Retro Gait: 2 reps Sidestepping: 2 reps;Theraband Marching: 10 reps Sit to Stand Limitations: 10x no UE Other Standing Exercises: tall kneeling forward/backward x 1 RT  Weight shifting Rt/Lt and anterior/posterior    Seated Other Seated Exercises: NuStep 10' level 3 hills #3 SPM at 80 to improve LE strength and activity tolerance         Physical Therapy Assessment and Plan PT Assessment and Plan Clinical Impression Statement: Pt completed all exercises with therapist facilitation to keep shoulders over base of support.  Pt needed to take multiple rest breaks throughout treatment today.  Added tall kneeling to program  Rehab Potential: Good PT Frequency: Min 3X/week PT Duration: 4 weeks PT Plan: Continue to progress dynamic balance, LE strength and overall safety awareness.     Goals    Problem List Patient Active Problem List   Diagnosis Date Noted  . Poor balance 10/14/2013  . Repeated falls 10/14/2013  . Acute encephalopathy 07/30/2013  . Encephalopathy, hepatic 07/30/2013  . Confusion 07/29/2013  . Pancreatitis 04/26/2013  . Edema of left lower extremity 04/14/2013  . Liver cirrhosis secondary to NASH 04/14/2013  . Generalized weakness 02/19/2013  . Acute blood loss anemia 02/18/2013  . UTI (urinary tract  infection) 02/14/2013  . Encephalopathy acute 02/14/2013  . Hyponatremia 02/14/2013  . Hypokalemia 02/14/2013  . Fracture of humeral shaft, right, closed 02/11/2013  . Multiple falls 01/26/2013  . Difficulty walking 01/26/2013  . Bilateral leg weakness 01/26/2013  . Personal history of fall 09/02/2012  . Dizziness 09/02/2012  . Frequent falls 08/08/2012  . Ataxia 08/08/2012  . Peripheral neuropathy 08/08/2012  . Preoperative clearance 06/06/2012  . 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 Anemia 04/08/2012  . Renal colic 47/65/4650  . Encephalopathy chronic 09/14/2011  . Posterior tibial tendon dysfunction 07/12/2011  . Hyperlipidemia 05/31/2011  . Essential hypertension 05/31/2011  . Uncontrolled type 2 diabetes mellitus with complication 35/46/5681  . GERD 09/29/2010  . Other chronic nonalcoholic liver disease 27/51/7001  . COLONIC POLYPS, HX OF 02/13/2010  . CIRRHOSIS 02/07/2010  . THROMBOCYTOPENIA, CHRONIC 01/04/2010  . PERIPHERAL NEUROPATHY 01/04/2010  . CAD, NATIVE VESSEL 01/04/2010  . ANXIETY DISORDER, HX OF 01/04/2010  . Intrinsic asthma, unspecified 03/16/2009  . ALLERGIC RHINITIS 02/18/2009  . DYSPNEA 02/18/2009  . DIABETES, TYPE 2 02/04/2009  . HYPERLIPIDEMIA 02/04/2009  . OBESITY 02/04/2009  . HYPERTENSION 02/04/2009       GP    RUSSELL,CINDY 11/13/2013, 4:08 PM

## 2013-11-16 ENCOUNTER — Ambulatory Visit (HOSPITAL_COMMUNITY)
Admission: RE | Admit: 2013-11-16 | Discharge: 2013-11-16 | Disposition: A | Discharge status: Home / Self Care | Payer: Medicare Other | Source: Ambulatory Visit | Attending: Family Medicine | Admitting: Family Medicine

## 2013-11-16 NOTE — Progress Notes (Signed)
Physical Therapy Re-evaluation  Patient Details  Name: [name] [name] MRN: [id number] Date of Birth: 08/05/1941  Today's Date: 11/16/2013 Time: 1300-1345 PT Time Calculation (min): 45 min            Visit#: 12 of 24  Diagnosis: gt instability Authorization: Medicare    Authorization Visit#: 76 of 20  Charges:  MMT 8088-1103 (8'), physical performance testing 1594-5859 (25'), self care 1337-1345 (8')   Subjective Pt states she can tell she is getting stronger.  States she has not fallen since beginning therapy.  Husband also reports noted improvements.  Objective: Functional Tests: sit to stand 7 X without UE in 30 seconds (was 7X with 1 UE) Functional Tests: foto functional status 60% (was 44%) , limitation 40% (was 56%)  RLE Strength Right Hip Flexion: 5/5 (was 5/5) Right Hip Extension: 3/5 (was 3-/5) Right Hip ABduction: 3+/5 (was 3+/5) Right Hip ADduction: 5/5 (was 5/5) Right Knee Flexion: 5/5 (was 5/5) Right Knee Extension: 5/5 (was 5/5) Right Ankle Dorsiflexion: 5/5 (was 5/5)  LLE Strength Left Hip Flexion: 5/5 (was 5/5) Left Hip Extension: 3/5 (was 3-/5) Left Hip ABduction: 3+/5 (was 3+/5) Left Hip ADduction: 5/5 (was 5/5) Left Knee Flexion: 5/5 (was 5/5) Left Knee Extension: 5/5 (was 5/5) Left Ankle Dorsiflexion: 5/5 (was 5/5)  Mobility/Balance  Berg Balance Test Total Score: 46 (was 34)    Physical Therapy Assessment and Plan PT Assessment and Plan Clinical Impression Statement: Pt has completed 12/12 PT sessions focusing on improving LE strength and balance.  Pt has met 2/3 STG's, 4/5 LTG's and is progressing towards others. Berg balance test has increased 12 points with her overal subjective functional limitation being 40% now.  Pt having most difficulty at this point with functional activity tolerance and dynamic balance.  Pt continues to be transported to therapy via wheelchair, however states she ambulates at home.  Pt would benefit from further therapy to  increase functional independence and return to prior status. PT Plan: Request to continue 3 x week  X 4 more weeks focusing on activity tolerance and dynamic balance.  Work up to 30 minutes walking tolerance (per PT goal) and single leg stance activities.  Goals Home Exercise Program Pt/caregiver will Perform Home Exercise Program: For increased strengthening - Progress: Met  PT Short Term Goals PT Short Term Goal 1: Pt to be able to complete 10 sit to stand in 30 seconds using one hand hold for increased power to assist in hills and steps- Progress: Met PT Short Term Goal 2: Pt to be ambulating with a walker x 15 minutes everyday - Progress: Partly met PT Short Term Goal 3: Pt Berg to improve by 5 pt to improve safety and decrease risk of falling- Progress: Met  PT Long Term Goals Time to Complete Long Term Goals: 4 weeks PT Long Term Goal 1: Pt to be able to complete 5 sit to stand in 30 seconds with no hand assist for improved balance and power- Progress: Met PT Long Term Goal 2: Pt to be ambulating with a walker for 30 minutes.- Progress: Progressing toward goal Long Term Goal 3: Berg score to be improved by 10 pts to improve safety and reduce risk of falling- Progress: Met Long Term Goal 4: Pt to have had no falls- Progress: Met Long Term Goal 5: Pt to be doing light activites while standing ie dishes, folding clothes...- Progress: Met  Problem List Patient Active Problem List   Diagnosis Date Noted  . Poor balance  10/14/2013  . Repeated falls 10/14/2013  . Acute encephalopathy 07/30/2013  . Encephalopathy, hepatic 07/30/2013  . Confusion 07/29/2013  . Pancreatitis 04/26/2013  . Edema of left lower extremity 04/14/2013  . Liver cirrhosis secondary to NASH 04/14/2013  . Generalized weakness 02/19/2013  . Acute blood loss anemia 02/18/2013  . UTI (urinary tract infection) 02/14/2013  . Encephalopathy acute 02/14/2013  . Hyponatremia 02/14/2013  . Hypokalemia 02/14/2013  .  Fracture of humeral shaft, right, closed 02/11/2013  . Multiple falls 01/26/2013  . Difficulty walking 01/26/2013  . Bilateral leg weakness 01/26/2013  . Personal history of fall 09/02/2012  . Dizziness 09/02/2012  . Frequent falls 08/08/2012  . Ataxia 08/08/2012  . Peripheral neuropathy 08/08/2012  . Preoperative clearance 06/06/2012  . 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 Anemia 04/08/2012  . Renal colic 38/18/2993  . Encephalopathy chronic 09/14/2011  . Posterior tibial tendon dysfunction 07/12/2011  . Hyperlipidemia 05/31/2011  . Essential hypertension 05/31/2011  . Uncontrolled type 2 diabetes mellitus with complication 71/69/6789  . GERD 09/29/2010  . Other chronic nonalcoholic liver disease 38/08/1750  . COLONIC POLYPS, HX OF 02/13/2010  . CIRRHOSIS 02/07/2010  . THROMBOCYTOPENIA, CHRONIC 01/04/2010  . PERIPHERAL NEUROPATHY 01/04/2010  . CAD, NATIVE VESSEL 01/04/2010  . ANXIETY DISORDER, HX OF 01/04/2010  . Intrinsic asthma, unspecified 03/16/2009  . ALLERGIC RHINITIS 02/18/2009  . DYSPNEA 02/18/2009  . DIABETES, TYPE 2 02/04/2009  . HYPERLIPIDEMIA 02/04/2009  . OBESITY 02/04/2009  . HYPERTENSION 02/04/2009        Teena Irani, PTA/CLT 11/16/2013, 2:32 PM

## 2013-11-17 ENCOUNTER — Other Ambulatory Visit: Payer: Self-pay | Admitting: Internal Medicine

## 2013-11-17 ENCOUNTER — Telehealth: Payer: Self-pay | Admitting: *Deleted

## 2013-11-17 DIAGNOSIS — K7460 Unspecified cirrhosis of liver: Secondary | ICD-10-CM

## 2013-11-17 NOTE — Telephone Encounter (Signed)
U/S scheduled for Thursday Jan 8th at 9:00 patient to arrive at 8:45 NPO

## 2013-11-17 NOTE — Telephone Encounter (Signed)
Pt called to schedule her ultra sound. Please advise [phone]

## 2013-11-18 ENCOUNTER — Ambulatory Visit (HOSPITAL_COMMUNITY)
Admission: RE | Admit: 2013-11-18 | Discharge: 2013-11-18 | Disposition: A | Discharge status: Home / Self Care | Payer: Medicare Other | Source: Ambulatory Visit | Attending: Family Medicine | Admitting: Family Medicine

## 2013-11-18 NOTE — Progress Notes (Signed)
Physical Therapy Treatment Patient Details  Name: [name] [name] MRN: [id number] Date of Birth: August 12, 1941  Today's Date: 11/18/2013 Time: 1305-1345 PT Time Calculation (min): 40 min Visit#: 13 of 24  Authorization: Medicare  Authorization Visit#: 97 of 20  Charges:  therex 51'   Subjective: Symptoms/Limitations Symptoms: Pt states she is walking more around her house, however continues to come to therapy with wheelchair. Pain Assessment Currently in Pain?: No/denies   Exercise/Treatments Standing Gait Training: 250 feet X 2 minutes X 6 to increase activity tolerance/strength and balance.   Other Standing Knee Exercises: dynamic balance activities with head turns/ stop-pivot turns  Physical Therapy Assessment and Plan PT Assessment and Plan Clinical Impression Statement: Focused session on activity tolerance with gait and challenging dynamic balance.  Pt able to complete 6 bouts of 250 feet taking 1.5-2 minutes each.  Pt with max ambulation abiltiy of 2 minutes before increased fatigue needing rest break.  Noted with increased dyspnea at this point.  Pt with most difficulty with turning, loosing balance but able to self correct.  PT Plan: Continue to increase activity tolerance for ambulation goal of 30 minutes.  Progress dynamic balance.     Problem List Patient Active Problem List   Diagnosis Date Noted  . Poor balance 10/14/2013  . Repeated falls 10/14/2013  . Acute encephalopathy 07/30/2013  . Encephalopathy, hepatic 07/30/2013  . Confusion 07/29/2013  . Pancreatitis 04/26/2013  . Edema of left lower extremity 04/14/2013  . Liver cirrhosis secondary to NASH 04/14/2013  . Generalized weakness 02/19/2013  . Acute blood loss anemia 02/18/2013  . UTI (urinary tract infection) 02/14/2013  . Encephalopathy acute 02/14/2013  . Hyponatremia 02/14/2013  . Hypokalemia 02/14/2013  . Fracture of humeral shaft, right, closed 02/11/2013  . Multiple falls 01/26/2013  . Difficulty  walking 01/26/2013  . Bilateral leg weakness 01/26/2013  . Personal history of fall 09/02/2012  . Dizziness 09/02/2012  . Frequent falls 08/08/2012  . Ataxia 08/08/2012  . Peripheral neuropathy 08/08/2012  . Preoperative clearance 06/06/2012  . 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 Anemia 04/08/2012  . Renal colic 02/63/7858  . Encephalopathy chronic 09/14/2011  . Posterior tibial tendon dysfunction 07/12/2011  . Hyperlipidemia 05/31/2011  . Essential hypertension 05/31/2011  . Uncontrolled type 2 diabetes mellitus with complication 85/12/7739  . GERD 09/29/2010  . Other chronic nonalcoholic liver disease 28/78/6767  . COLONIC POLYPS, HX OF 02/13/2010  . CIRRHOSIS 02/07/2010  . THROMBOCYTOPENIA, CHRONIC 01/04/2010  . PERIPHERAL NEUROPATHY 01/04/2010  . CAD, NATIVE VESSEL 01/04/2010  . ANXIETY DISORDER, HX OF 01/04/2010  . Intrinsic asthma, unspecified 03/16/2009  . ALLERGIC RHINITIS 02/18/2009  . DYSPNEA 02/18/2009  . DIABETES, TYPE 2 02/04/2009  . HYPERLIPIDEMIA 02/04/2009  . OBESITY 02/04/2009  . HYPERTENSION 02/04/2009       Teena Irani, PTA/CLT 11/18/2013, 4:53 PM

## 2013-11-19 ENCOUNTER — Ambulatory Visit (HOSPITAL_COMMUNITY)
Admission: RE | Admit: 2013-11-19 | Discharge: 2013-11-19 | Disposition: A | Discharge status: Home / Self Care | Payer: Medicare Other | Source: Ambulatory Visit | Attending: Internal Medicine | Admitting: Internal Medicine

## 2013-11-19 DIAGNOSIS — K7460 Unspecified cirrhosis of liver: Secondary | ICD-10-CM | POA: Insufficient documentation

## 2013-11-19 DIAGNOSIS — K8020 Calculus of gallbladder without cholecystitis without obstruction: Secondary | ICD-10-CM | POA: Insufficient documentation

## 2013-11-19 DIAGNOSIS — R161 Splenomegaly, not elsewhere classified: Secondary | ICD-10-CM | POA: Insufficient documentation

## 2013-11-20 ENCOUNTER — Ambulatory Visit (HOSPITAL_COMMUNITY)
Admission: RE | Admit: 2013-11-20 | Discharge: 2013-11-20 | Disposition: A | Discharge status: Home / Self Care | Payer: Medicare Other | Source: Ambulatory Visit | Attending: Family Medicine | Admitting: Family Medicine

## 2013-11-20 DIAGNOSIS — R2689 Other abnormalities of gait and mobility: Secondary | ICD-10-CM

## 2013-11-20 DIAGNOSIS — R296 Repeated falls: Secondary | ICD-10-CM

## 2013-11-20 NOTE — Progress Notes (Signed)
Physical Therapy Treatment Patient Details  Name: [name] [name] MRN: [id number] Date of Birth: 03/15/1941  Today's Date: 11/20/2013 Time: 1302-1358 PT Time Calculation (min): 56 min Charge ;Gait 1302-1340, TE 7782-4235  Visit#: 14 of 24  Re-eval: 12/14/13 Assessment Diagnosis: gt instability Prior Therapy: HH for two months  Authorization: Medicare  Authorization Time Period:    Authorization Visit#: 14 of 20   Subjective: Symptoms/Limitations Symptoms: Pt stated she is walking around the house, continues to come to therapy in wheelchair stateing her husband scared she will fall coming into dept.  Pain free today Pain Assessment Currently in Pain?: No/denies  Objective:   Exercise/Treatments Aerobic Tread Mill: Gait training 3 sets, 7 minutes total .35 cyc/sec 2', 2', 3' O2 sat at 96%  Standing Gait Training: 3 sets ambulating 280feet x 2 min each Other Standing Knee Exercises: dynamic balance activities with head turns/ stop-pivot turns Seated Other Seated Knee Exercises: Diaphragmatic breathing x 5 minutes to imcrease O2 sat   Physical Therapy Assessment and Plan PT Assessment and Plan Clinical Impression Statement: Session focus on improve gait mechanics, balance and LE strengthening. Pt able to complete 3 minutes max gait training on TM prior requiring rest break. Pt with difficulty breathing requiring rest breaks and diaphragmatic breath training. Initial O2 sat at 86% but able to increase O2 sat to 96% in 12 seconds. Began gait training on treadmill with multimodal cueing to improve mechanics. Nustep complete just LE,for strengthning, pt given HEP worksheet for LE strengthening. Reviewed and pt able to verbalize proper technique with all exercises using teach back method PT Plan: Continue to increase activity tolerance for ambulation goal of 30 minutes.  Progress dynamic balance.    Goals Home Exercise Program Pt/caregiver will Perform Home Exercise Program: For  increased strengthening PT Short Term Goals PT Short Term Goal 1: Pt to be able to complete 10 sit to stand in 30 seconds using one hand hold for increased power to assist in hills and steps PT Short Term Goal 2: Pt to be ambulating with a walker x 15 minutes everyday PT Short Term Goal 2 - Progress: Progressing toward goal PT Short Term Goal 3: Pt Berg to improve by 5 pt to improve safety and decrease risk of falling PT Long Term Goals Time to Complete Long Term Goals: 4 weeks PT Long Term Goal 1: Pt to be able to complet 5 sit to stand with no hand assist for improved balance and power PT Long Term Goal 2: Pt to be ambulating with a walker for 30 minutes. PT Long Term Goal 2 - Progress: Progressing toward goal Long Term Goal 3: Berg score to be improved by 10 pts to improve safety and reduce risk of falling Long Term Goal 4: Pt to have had no falls PT Long Term Goal 5: Pt to be doing light activites while standing ie dishes, folding clothes...  Problem List Patient Active Problem List   Diagnosis Date Noted  . Poor balance 10/14/2013  . Repeated falls 10/14/2013  . Acute encephalopathy 07/30/2013  . Encephalopathy, hepatic 07/30/2013  . Confusion 07/29/2013  . Pancreatitis 04/26/2013  . Edema of left lower extremity 04/14/2013  . Liver cirrhosis secondary to NASH 04/14/2013  . Generalized weakness 02/19/2013  . Acute blood loss anemia 02/18/2013  . UTI (urinary tract infection) 02/14/2013  . Encephalopathy acute 02/14/2013  . Hyponatremia 02/14/2013  . Hypokalemia 02/14/2013  . Fracture of humeral shaft, right, closed 02/11/2013  . Multiple falls 01/26/2013  . Difficulty  walking 01/26/2013  . Bilateral leg weakness 01/26/2013  . Personal history of fall 09/02/2012  . Dizziness 09/02/2012  . Frequent falls 08/08/2012  . Ataxia 08/08/2012  . Peripheral neuropathy 08/08/2012  . Preoperative clearance 06/06/2012  . 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  Anemia 04/08/2012  . Renal colic 32/67/1245  . Encephalopathy chronic 09/14/2011  . Posterior tibial tendon dysfunction 07/12/2011  . Hyperlipidemia 05/31/2011  . Essential hypertension 05/31/2011  . Uncontrolled type 2 diabetes mellitus with complication 80/99/8338  . GERD 09/29/2010  . Other chronic nonalcoholic liver disease 25/03/3975  . COLONIC POLYPS, HX OF 02/13/2010  . CIRRHOSIS 02/07/2010  . THROMBOCYTOPENIA, CHRONIC 01/04/2010  . PERIPHERAL NEUROPATHY 01/04/2010  . CAD, NATIVE VESSEL 01/04/2010  . ANXIETY DISORDER, HX OF 01/04/2010  . Intrinsic asthma, unspecified 03/16/2009  . ALLERGIC RHINITIS 02/18/2009  . DYSPNEA 02/18/2009  . DIABETES, TYPE 2 02/04/2009  . HYPERLIPIDEMIA 02/04/2009  . OBESITY 02/04/2009  . HYPERTENSION 02/04/2009    PT - End of Session Equipment Utilized During Treatment: Gait belt Activity Tolerance: Patient limited by fatigue;Patient tolerated treatment well General Behavior During Therapy: [facility] for tasks assessed/performed  GP    Aldona Lento 11/20/2013, 6:27 PM

## 2013-11-23 ENCOUNTER — Ambulatory Visit (HOSPITAL_COMMUNITY)
Admission: RE | Admit: 2013-11-23 | Discharge: 2013-11-23 | Disposition: A | Discharge status: Home / Self Care | Payer: Medicare Other | Source: Ambulatory Visit | Attending: Family Medicine | Admitting: Family Medicine

## 2013-11-23 NOTE — Progress Notes (Signed)
Physical Therapy Treatment Patient Details  Name: [name] [name] MRN: [id number] Date of Birth: 1941-09-28  Today's Date: 11/23/2013 Time: 8850-2774 PT Time Calculation (min): 38 min Charges: NMR x 28'  Visit#: 15 of 24  Re-eval: 12/14/13  Authorization: Medicare  Authorization Visit#: 15 of 20   Subjective: Symptoms/Limitations Symptoms: Pt is pain free and without complaint. Pain Assessment Currently in Pain?: No/denies   Exercise/Treatments Standing Gait Training: 3' with O2 sat 97-91%; 2 minutes O2 sat 97-87% Standing Tandem Stance: 2 reps;30 secs;Hand held assist (HHA) 2;Eyes open Standing, One Foot on a Step: Eyes open;6 inch;2 reps;30 secs Marching: 10 reps   Physical Therapy Assessment and Plan PT Assessment and Plan Clinical Impression Statement: Therapist facilitated activities to improve proprioceptive control and activity tolerance. Pt's O2 sat decreased to 87% after 2 minutes of walking. Called PCP and notified nurse who requested that we send a note to the doctor regarding this issue. Pt has appointment with MD this week. PT Plan: Continue to increase activity tolerance for ambulation goal of 30 minutes.  Monitor O2 sat.    Problem List Patient Active Problem List   Diagnosis Date Noted  . Poor balance 10/14/2013  . Repeated falls 10/14/2013  . Acute encephalopathy 07/30/2013  . Encephalopathy, hepatic 07/30/2013  . Confusion 07/29/2013  . Pancreatitis 04/26/2013  . Edema of left lower extremity 04/14/2013  . Liver cirrhosis secondary to NASH 04/14/2013  . Generalized weakness 02/19/2013  . Acute blood loss anemia 02/18/2013  . UTI (urinary tract infection) 02/14/2013  . Encephalopathy acute 02/14/2013  . Hyponatremia 02/14/2013  . Hypokalemia 02/14/2013  . Fracture of humeral shaft, right, closed 02/11/2013  . Multiple falls 01/26/2013  . Difficulty walking 01/26/2013  . Bilateral leg weakness 01/26/2013  . Personal history of fall 09/02/2012  .  Dizziness 09/02/2012  . Frequent falls 08/08/2012  . Ataxia 08/08/2012  . Peripheral neuropathy 08/08/2012  . Preoperative clearance 06/06/2012  . 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 Anemia 04/08/2012  . Renal colic 12/87/8676  . Encephalopathy chronic 09/14/2011  . Posterior tibial tendon dysfunction 07/12/2011  . Hyperlipidemia 05/31/2011  . Essential hypertension 05/31/2011  . Uncontrolled type 2 diabetes mellitus with complication 72/07/4708  . GERD 09/29/2010  . Other chronic nonalcoholic liver disease 62/83/6629  . COLONIC POLYPS, HX OF 02/13/2010  . CIRRHOSIS 02/07/2010  . THROMBOCYTOPENIA, CHRONIC 01/04/2010  . PERIPHERAL NEUROPATHY 01/04/2010  . CAD, NATIVE VESSEL 01/04/2010  . ANXIETY DISORDER, HX OF 01/04/2010  . Intrinsic asthma, unspecified 03/16/2009  . ALLERGIC RHINITIS 02/18/2009  . DYSPNEA 02/18/2009  . DIABETES, TYPE 2 02/04/2009  . HYPERLIPIDEMIA 02/04/2009  . OBESITY 02/04/2009  . HYPERTENSION 02/04/2009    PT - End of Session Equipment Utilized During Treatment: Gait belt Activity Tolerance: Patient limited by fatigue;Patient tolerated treatment well General Behavior During Therapy: [facility] for tasks assessed/performed  Rachelle Hora, PTA  11/23/2013, 6:07 PM

## 2013-11-25 ENCOUNTER — Ambulatory Visit (HOSPITAL_COMMUNITY)
Admission: RE | Admit: 2013-11-25 | Discharge: 2013-11-25 | Disposition: A | Discharge status: Home / Self Care | Payer: Medicare Other | Source: Ambulatory Visit | Attending: Family Medicine | Admitting: Family Medicine

## 2013-11-25 NOTE — Progress Notes (Signed)
Physical Therapy Treatment Patient Details  Name: [name] [name] MRN: [id number] Date of Birth: Jul 06, 1941  Today's Date: 11/25/2013 Time: 1520-1602 PT Time Calculation (min): 42 min Charges: Gait x 69'(6789-3810) Therex x 17'(5102-5852)  Visit#: 16 of 24  Re-eval: 12/14/13  Authorization: Medicare   Authorization Visit#: 16 of 20   Subjective: Symptoms/Limitations Symptoms: Pt states that MD did not receive letter from therapist but pt made MD aware that her oxygen went down to the 80's while she was walking. Pt states MD made no changes in treatment plan. Pain Assessment Currently in Pain?: No/denies   Exercise/Treatments Standing Other Standing Exercises: Gait without AD, SPC, and front-wheeled walker (O2 monitored)  Seated Other Seated Exercises: NuStep 10' level 3 hills #3 SPM at 80 to improve LE strength and activity tolerance (O2 monitored)   Physical Therapy Assessment and Plan PT Assessment and Plan Clinical Impression Statement: Treatment focus on improving activity tolerance and stability with gait. Attempted gait training with SPC but pt has significant difficulty with this secondary to decrease coordination. Completed gait training with walker with improved stability and improved activity tolerance. O2 levels stayed above 90% throughout session. Pt states that she currently does not use AD when ambulating at home. Suggested that pt look into purchasing Rollator to improve safety with ambulation. PT Plan: Continue to increase activity tolerance for ambulation goal of 30 minutes. F/U reguarding Rollator.     Problem List Patient Active Problem List   Diagnosis Date Noted  . Poor balance 10/14/2013  . Repeated falls 10/14/2013  . Acute encephalopathy 07/30/2013  . Encephalopathy, hepatic 07/30/2013  . Confusion 07/29/2013  . Pancreatitis 04/26/2013  . Edema of left lower extremity 04/14/2013  . Liver cirrhosis secondary to NASH 04/14/2013  . Generalized weakness  02/19/2013  . Acute blood loss anemia 02/18/2013  . UTI (urinary tract infection) 02/14/2013  . Encephalopathy acute 02/14/2013  . Hyponatremia 02/14/2013  . Hypokalemia 02/14/2013  . Fracture of humeral shaft, right, closed 02/11/2013  . Multiple falls 01/26/2013  . Difficulty walking 01/26/2013  . Bilateral leg weakness 01/26/2013  . Personal history of fall 09/02/2012  . Dizziness 09/02/2012  . Frequent falls 08/08/2012  . Ataxia 08/08/2012  . Peripheral neuropathy 08/08/2012  . Preoperative clearance 06/06/2012  . 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 Anemia 04/08/2012  . Renal colic 77/82/4235  . Encephalopathy chronic 09/14/2011  . Posterior tibial tendon dysfunction 07/12/2011  . Hyperlipidemia 05/31/2011  . Essential hypertension 05/31/2011  . Uncontrolled type 2 diabetes mellitus with complication 36/14/4315  . GERD 09/29/2010  . Other chronic nonalcoholic liver disease 40/06/6760  . COLONIC POLYPS, HX OF 02/13/2010  . CIRRHOSIS 02/07/2010  . THROMBOCYTOPENIA, CHRONIC 01/04/2010  . PERIPHERAL NEUROPATHY 01/04/2010  . CAD, NATIVE VESSEL 01/04/2010  . ANXIETY DISORDER, HX OF 01/04/2010  . Intrinsic asthma, unspecified 03/16/2009  . ALLERGIC RHINITIS 02/18/2009  . DYSPNEA 02/18/2009  . DIABETES, TYPE 2 02/04/2009  . HYPERLIPIDEMIA 02/04/2009  . OBESITY 02/04/2009  . HYPERTENSION 02/04/2009    PT - End of Session Equipment Utilized During Treatment: Gait belt Activity Tolerance: Patient limited by fatigue;Patient tolerated treatment well General Behavior During Therapy: [facility] for tasks assessed/performed  Rachelle Hora, PTA  11/25/2013, 6:09 PM

## 2013-11-27 ENCOUNTER — Telehealth (HOSPITAL_COMMUNITY): Payer: Self-pay

## 2013-11-27 ENCOUNTER — Ambulatory Visit (HOSPITAL_COMMUNITY): Payer: Medicare Other | Admitting: Occupational Therapy

## 2013-11-27 ENCOUNTER — Ambulatory Visit (HOSPITAL_COMMUNITY): Payer: Medicare Other | Admitting: Physical Therapy

## 2013-11-30 ENCOUNTER — Ambulatory Visit (HOSPITAL_COMMUNITY)
Admission: RE | Admit: 2013-11-30 | Discharge: 2013-11-30 | Disposition: A | Discharge status: Home / Self Care | Payer: Medicare Other | Source: Ambulatory Visit | Attending: Family Medicine | Admitting: Family Medicine

## 2013-11-30 NOTE — Progress Notes (Signed)
Physical Therapy Treatment Patient Details  Name: [name] [name] MRN: [id number] Date of Birth: Oct 22, 1941  Today's Date: 11/30/2013 Time: 0160-1093 PT Time Calculation (min): 38 min Charges: Gait x 23'(5573-2202) NMR x 54'(2706-2376)   Visit#: 17 of 24  Re-eval: 12/14/13  Authorization: Medicare  Authorization Visit#: 17 of 20   Subjective: Symptoms/Limitations Symptoms: Pt states that she isl ooking into purchasing a rolling walker. Pain Assessment Currently in Pain?: No/denies  Exercise/Treatments Standing Gait training with rolling walker 2 sets, 10 minutes total O2 sat above 90% Standing Eyes Opened: Narrow base of support (BOS);2 reps;30 secs;Limitations Standing Eyes Opened Limitations: With perturbations Tandem Stance: 2 reps;30 secs;Hand held assist (HHA) 2;Eyes open Marching: 10 reps without UE assistance Heel Raises: 10 reps without UE assistance   Physical Therapy Assessment and Plan PT Assessment and Plan Clinical Impression Statement: Therapist facilitated activities to improve activity tolerance and stability with gait. Pt able to ambulate for max of 6'20" with rolling walker. Gait is continues to be much more stable with rolling walker. Pt tends to push walker to right rather than stay centered. Pt requires multimodal cueing to improve awareness of obstacles. O2 levels stayed above 90% throughout session. PT Plan: Continue to increase activity tolerance for ambulation goal of 30 minutes. Continue to monitor O2 levels throughout session.    Problem List Patient Active Problem List   Diagnosis Date Noted  . Poor balance 10/14/2013  . Repeated falls 10/14/2013  . Acute encephalopathy 07/30/2013  . Encephalopathy, hepatic 07/30/2013  . Confusion 07/29/2013  . Pancreatitis 04/26/2013  . Edema of left lower extremity 04/14/2013  . Liver cirrhosis secondary to NASH 04/14/2013  . Generalized weakness 02/19/2013  . Acute blood loss anemia 02/18/2013  . UTI  (urinary tract infection) 02/14/2013  . Encephalopathy acute 02/14/2013  . Hyponatremia 02/14/2013  . Hypokalemia 02/14/2013  . Fracture of humeral shaft, right, closed 02/11/2013  . Multiple falls 01/26/2013  . Difficulty walking 01/26/2013  . Bilateral leg weakness 01/26/2013  . Personal history of fall 09/02/2012  . Dizziness 09/02/2012  . Frequent falls 08/08/2012  . Ataxia 08/08/2012  . Peripheral neuropathy 08/08/2012  . Preoperative clearance 06/06/2012  . 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 Anemia 04/08/2012  . Renal colic 28/31/5176  . Encephalopathy chronic 09/14/2011  . Posterior tibial tendon dysfunction 07/12/2011  . Hyperlipidemia 05/31/2011  . Essential hypertension 05/31/2011  . Uncontrolled type 2 diabetes mellitus with complication 16/05/3709  . GERD 09/29/2010  . Other chronic nonalcoholic liver disease 62/69/4854  . COLONIC POLYPS, HX OF 02/13/2010  . CIRRHOSIS 02/07/2010  . THROMBOCYTOPENIA, CHRONIC 01/04/2010  . PERIPHERAL NEUROPATHY 01/04/2010  . CAD, NATIVE VESSEL 01/04/2010  . ANXIETY DISORDER, HX OF 01/04/2010  . Intrinsic asthma, unspecified 03/16/2009  . ALLERGIC RHINITIS 02/18/2009  . DYSPNEA 02/18/2009  . DIABETES, TYPE 2 02/04/2009  . HYPERLIPIDEMIA 02/04/2009  . OBESITY 02/04/2009  . HYPERTENSION 02/04/2009    PT - End of Session Equipment Utilized During Treatment: Gait belt Activity Tolerance: Patient limited by fatigue;Patient tolerated treatment well General Behavior During Therapy: [facility] for tasks assessed/performed  Rachelle Hora, PTA 11/30/2013, 5:49 PM

## 2013-12-01 NOTE — Evaluation (Signed)
Occupational Therapy Evaluation  Patient Details  Name: [name] [name] MRN: [id number] Date of Birth: Apr 16, 1941  Today's Date: 11/23/2013 Time: [id number] OT Time Calculation (min): 38 min Evaluation [id number] (106') TherExercises [id number] (10')  Visit#: 1 of 16  Re-eval: 12/21/13     Authorization: Medicare  Authorization Time Period: before 10th visit  Authorization Visit#: 1 of 10   Past Medical History:  Past Medical History  Diagnosis Date  . CAD in native artery   . Thrombocytopenia, unspecified   . Unspecified hereditary and idiopathic peripheral neuropathy   . Unspecified fall   . Other and unspecified hyperlipidemia   . Unspecified essential hypertension   . Obesity, unspecified   . Shortness of breath   . Personal history of neurosis   . Intrinsic asthma, unspecified   . Allergic rhinitis, cause unspecified   . Cirrhosis of liver     NASH, afp on 06/17/12 =3.7, per pt she had hep B vaccines in 1996, hep A status unknown  . Colon adenomas 03/08/10    tcs by Dr. Gala Romney  . Hyperplastic polyps of stomach 03/08/10    tcs by Dr. Dudley Major  . Chronic gastritis 03/09/11    egd by Dr. Gala Romney  . History of hemorrhoids 03/08/10    tcs- internal and external  . Diverticula of colon 03/08/10    L side  . Hiatal hernia 03/08/10  . Esophagitis, erosive 03/08/10  . GERD (gastroesophageal reflux disease)   . Wears glasses   . Type II or unspecified type diabetes mellitus without mention of complication, not stated as uncontrolled   . Vertigo   . Pancreatitis   . Bilateral leg weakness    Past Surgical History:  Past Surgical History  Procedure Laterality Date  . Hysterectomy and btl      s/p  . Abdominal hysterectomy    . Tumor excision  2003    rt arm and left foot  . Colonoscopy  03/08/10    Dr. [name]-->ext/int hemorrhoids, anal paipilla, rectal polyps, desc polyps, cecal polyp, left-sided diverticula. suboptiomal prep. next TCS 02/2015. multiple adenomas  .  Esophagogastroduodenoscopy  03/08/10    Dr. [name] erosive RE, small hh, antral erosions, small 1cm are of mucosal indentation along gastric body of doubtful significance, cystic nodularity of hypophyarynx, base of tongue, base of epiglottis. benign gastric biopsies  . Tubal ligation    . Esophagogastroduodenoscopy  10/08/2011    Dr. [name] esophageal varices, antral erosion. next egd 09/2013  . Foot surgery  2003    lt foot  . Eye surgery      cataracts bilateral  . Orif humerus fracture Right 02/17/2013    Procedure: OPEN REDUCTION INTERNAL FIXATION (ORIF) RIGHT PROXIMAL HUMERUS FRACTURE;  Surgeon: [name], MD;  Location: [facility];  Service: Orthopedics;  Laterality: Right;  . Partial gastrectomy      family denies  . Breast surgery    . Cardiac catheterization  02/25/2007    Dr. [name]:  normal LV systolic function, mild irregularities of LAD    Subjective Symptoms/Limitations Symptoms: I fell into the door way and broke my arm and now I have a plate and screws and hardware lots of things still in there  Pertinent History: patient states she fell in April 2014 into doorway from standing with resulting right proximal humerus fracture.  She reports having therapy at hospital then transfered to [facility] for SNF rehab till ~April 27, 2013.  She was transferred home with home health services, now  receiving outpatient Physical Therapy  with new referral for OT this date.  Patient Stated Goals: to use her arm again Pain Assessment Currently in Pain?: Yes Pain Score: 5  Pain Location: Arm (proximal) Pain Orientation: Right Pain Type: Acute pain  Precautions/Restrictions  Precautions Precautions: Fall Restrictions Weight Bearing Restrictions: No Other Position/Activity Restrictions: patient has bone sitmulator to wear on RUE 2x/daily   Balance Screening Balance Screen Has the patient fallen in the past 6 months: Yes How many times?: 3 Has the patient had a decrease in  activity level because of a fear of falling? : No Is the patient reluctant to leave their home because of a fear of falling? : No  Prior Functioning     Assessment  11/23/13 1400  Assessment  Diagnosis fall s/p right proximal humerus fracture  Surgical Date 02/12/13  Next MD Visit march 2015  Prior Therapy acute, SNF, home health now outpatient   Precautions  Precautions Fall  Restrictions  Weight Bearing Restrictions No  Other Position/Activity Restrictions patient has bone sitmulator to wear on RUE 2x/daily   Balance Screen  Has the patient fallen in the past 6 months Yes  How many times? 3  Has the patient had a decrease in activity level because of a fear of falling?  No  Is the patient reluctant to leave their home because of a fear of falling?  No  Home Living  Family/patient expects to be discharged to: Private residence  Living Arrangements Spouse/significant other  Prior Function  Level of [place] with basic ADLs  Driving (patient states her husband/family  wont let her drive. )  Vocation Retired  Leisure Surveyor, quantity (Comment)  Comments TV, puzzles, go out with grandchildren/family, going shopping   ADL  ADL Comments difficulty fastening bra (currently wearing tanks instead), difficulty washing her hair, difficulty with kitchen tasks she attempts, reaching past shoulder height   Vision - History  Baseline Vision Wears glasses only for reading  Vision - Assessment  Eye Alignment WFL  Cognition  Overall Cognitive Status Within Functional Limits for tasks assessed  Arousal/Alertness Awake/alert  Orientation Level Oriented X4  Observation/Other Assessments  Observations patient arrives in wheel chair this date due to reported safety and endurance.  Sensation  Light Touch Appears Intact  Coordination  Gross Motor Movements are Fluid and Coordinated Yes  Fine Motor Movements are Fluid and Coordinated Yes  RUE Assessment  RUE Assessment X  RUE  AROM (degrees)  RUE Overall AROM Comments measurement taken in sitting   Right Shoulder Flexion 87 Degrees  Right Shoulder ABduction 85 Degrees  Right Shoulder Internal Rotation 82 Degrees  Right Shoulder External Rotation 54 Degrees  RUE Strength  RUE Overall Strength Comments assessed in sitting   Right Shoulder Flexion 3-/5  Right Shoulder ABduction 3-/5  Right Shoulder Internal Rotation 3/5  Right Shoulder External Rotation 3-/5  Grip (lbs) 53  LUE Assessment  LUE Assessment WFL  LUE Strength  Grip (lbs) 51  Palpation  Palpation moderate fascial restrictions in right upper arm, trapezius and scapular regions   Written Expression  Dominant Hand Right     Occupational Therapy Assessment and Plan OT Assessment and Plan Clinical Impression Statement: Patient presents with decreased AROM, PROM, strength and increased pain and fasical restrictions s/p right proximal humerus fracture. Defictis are affecting ability to complete B/IADLs, current mobility and  leisure activities using right hand as dominant. Pt will benefit from skilled therapeutic intervention in order to improve on the following deficits:  Decreased strength;Decreased range of motion;Impaired UE functional use;Decreased activity tolerance;Pain;Increased fascial restricitons Rehab Potential: Good OT Frequency: Min 2X/week OT Duration: 8 weeks OT Treatment/Interventions: Self-care/ADL training;Therapeutic activities;Therapeutic exercise;Manual therapy;Modalities;Patient/family education OT Plan: P: Skilled OT intervention to decrease pain and fascial restrictions for improvement of  pain free mobility in right shoulder region and improve ROM to [facility].      Treatment Plan:  PROM/ AAROM progressing to AROM, as tolerated.   Goals Home Exercise Program Pt/caregiver will Perform Home Exercise Program: For increased ROM;For increased strengthening PT Goal: Perform Home Exercise Program - Progress: Goal set today Short Term  Goals Time to Complete Short Term Goals: 4 weeks Short Term Goal 1: Paatient will be edcuated on HEP  Short Term Goal 2: Patient will improve PROM in right shoulder and elbow to [facility] for increased ability to fix her hair.  Short Term Goal 3: Patient will decrease right shoulder pain to 3/10 when completing functional daily  activities.  Short Term Goal 4: Patient will improve right shoulder strength to 3+/5 for increased ability to manipulate bra and improve functional mobility/safety Short Term Goal 5: Patient will decrease fascial restrictions to min in her right shoulder region.  Long Term Goals Time to Complete Long Term Goals: 8 weeks Long Term Goal 1: Patient will return to prior level of independence with all B/IADLs and leisure activities. Long Term Goal 2: Patient will improve right shoulder strength to >/= 4/5 for increased ability to play/engage with her grandchildren Long Term Goal 3: Patient will improve RUE AROM to [facility] for increased ability to reach into overhead cabinets.  Long Term Goal 4: Patient will decrease right shoulder pain to </=1/10 when completing functional activities.  Long Term Goal 5: Patient will decrease  fascial restrictions to trace in right shoulder region.  Problem List Patient Active Problem List   Diagnosis Date Noted  . Poor balance 10/14/2013  . Repeated falls 10/14/2013  . Acute encephalopathy 07/30/2013  . Encephalopathy, hepatic 07/30/2013  . Confusion 07/29/2013  . Pancreatitis 04/26/2013  . Edema of left lower extremity 04/14/2013  . Liver cirrhosis secondary to NASH 04/14/2013  . Generalized weakness 02/19/2013  . Acute blood loss anemia 02/18/2013  . UTI (urinary tract infection) 02/14/2013  . Encephalopathy acute 02/14/2013  . Hyponatremia 02/14/2013  . Hypokalemia 02/14/2013  . Fracture of humeral shaft, right, closed 02/11/2013  . Multiple falls 01/26/2013  . Difficulty walking 01/26/2013  . Bilateral leg weakness 01/26/2013  .  Personal history of fall 09/02/2012  . Dizziness 09/02/2012  . Frequent falls 08/08/2012  . Ataxia 08/08/2012  . Peripheral neuropathy 08/08/2012  . Preoperative clearance 06/06/2012  . 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 Anemia 04/08/2012  . Renal colic 31/54/0086  . Encephalopathy chronic 09/14/2011  . Posterior tibial tendon dysfunction 07/12/2011  . Hyperlipidemia 05/31/2011  . Essential hypertension 05/31/2011  . Uncontrolled type 2 diabetes mellitus with complication 76/19/5093  . GERD 09/29/2010  . Other chronic nonalcoholic liver disease 26/71/2458  . COLONIC POLYPS, HX OF 02/13/2010  . CIRRHOSIS 02/07/2010  . THROMBOCYTOPENIA, CHRONIC 01/04/2010  . PERIPHERAL NEUROPATHY 01/04/2010  . CAD, NATIVE VESSEL 01/04/2010  . ANXIETY DISORDER, HX OF 01/04/2010  . Intrinsic asthma, unspecified 03/16/2009  . ALLERGIC RHINITIS 02/18/2009  . DYSPNEA 02/18/2009  . DIABETES, TYPE 2 02/04/2009  . HYPERLIPIDEMIA 02/04/2009  . OBESITY 02/04/2009  . HYPERTENSION 02/04/2009    End of Session Activity Tolerance: Patient tolerated treatment well General Behavior During Therapy: [facility]  for tasks assessed/performed OT Plan of Care OT Home Exercise Plan: table slides/stretches OT Patient Instructions: handout given  Consulted and Agree with Plan of Care: Patient;Family member/caregiver Family Member Consulted: husband  GO Functional Limitation: Carrying, moving and handling objects Carrying, Moving and Handling Objects Current Status [phone]): At least 40 percent but less than 60 percent impaired, limited or restricted Carrying, Moving and Handling Objects Goal Status [phone]): At least 1 percent but less than 20 percent impaired, limited or restricted  Donney Rankins, OTR/L  11/23/2013, 5:25 PM  Physician Documentation Your signature is required to indicate approval of the treatment plan as stated above.  Please sign and either send electronically or make a copy  of this report for your files and return this physician signed original.  Please mark one 1.__approve of plan  2. ___approve of plan with the following conditions.   ______________________________                                                          _____________________ Physician Signature                                                                                                             Date

## 2013-12-02 ENCOUNTER — Ambulatory Visit (HOSPITAL_COMMUNITY)
Admission: RE | Admit: 2013-12-02 | Discharge: 2013-12-02 | Disposition: A | Discharge status: Home / Self Care | Payer: Medicare Other | Source: Ambulatory Visit | Attending: Family Medicine | Admitting: Family Medicine

## 2013-12-02 NOTE — Progress Notes (Signed)
Physical Therapy Treatment Patient Details  Name: [name] [name] MRN: [id number] Date of Birth: [birth year]  Today's Date: 12/02/2013 Time: 1518-1600 PT Time Calculation (min): 42 min Charges: Gait training x 12'(2482-5003) NMR x 70'(4888-9169)  Visit#: 18 of 24  Re-eval: 12/14/13  Authorization: Medicare  Authorization Visit#: 18 of 20   Subjective: Symptoms/Limitations Symptoms: Pt states that her shoulder is feeling much better after beginning occupational therapy.  Pain Assessment Currently in Pain?: No/denies  Exercise/Treatments Gait Training: Gait training with rolling walker 2 sets, 12 minutes total O2 sat above 90% Standing Eyes Opened: Narrow base of support (BOS);2 reps;30 secs;Limitations Tandem Stance: 2 reps;30 secs;Hand held assist (HHA) 2;Eyes open Marching: 10 reps;Limitations Marching Limitations: without UE assistance Heel Raises: 10 reps;Limitations Heel Raises Limitations: without UE assistance  NuStep x 10' level 3 hills#3  SPM average:82 to improve activity tolerance  Physical Therapy Assessment and Plan PT Assessment and Plan Clinical Impression Statement: Pt displays improved activity tolerance this session. O2 levels stayed above 90 throughout session. Pt tends to run into obstacles that are to her right. Pt requires VCs to improve concentration and control with gait. Pt is progressing toward goals. PT Plan: Continue to increase activity tolerance for ambulation goal of 30 minutes and progress balance/stability. Continue to monitor O2 levels throughout session.    Goals    Problem List Patient Active Problem List   Diagnosis Date Noted  . Poor balance 10/14/2013  . Repeated falls 10/14/2013  . Acute encephalopathy 07/30/2013  . Encephalopathy, hepatic 07/30/2013  . Confusion 07/29/2013  . Pancreatitis 04/26/2013  . Edema of left lower extremity 04/14/2013  . Liver cirrhosis secondary to NASH 04/14/2013  . Generalized weakness 02/19/2013  .  Acute blood loss anemia 02/18/2013  . UTI (urinary tract infection) 02/14/2013  . Encephalopathy acute 02/14/2013  . Hyponatremia 02/14/2013  . Hypokalemia 02/14/2013  . Fracture of humeral shaft, right, closed 02/11/2013  . Multiple falls 01/26/2013  . Difficulty walking 01/26/2013  . Bilateral leg weakness 01/26/2013  . Personal history of fall 09/02/2012  . Dizziness 09/02/2012  . Frequent falls 08/08/2012  . Ataxia 08/08/2012  . Peripheral neuropathy 08/08/2012  . Preoperative clearance 06/06/2012  . 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 Anemia 04/08/2012  . Renal colic 45/01/8881  . Encephalopathy chronic 09/14/2011  . Posterior tibial tendon dysfunction 07/12/2011  . Hyperlipidemia 05/31/2011  . Essential hypertension 05/31/2011  . Uncontrolled type 2 diabetes mellitus with complication 80/01/4916  . GERD 09/29/2010  . Other chronic nonalcoholic liver disease 91/50/5697  . COLONIC POLYPS, HX OF 02/13/2010  . CIRRHOSIS 02/07/2010  . THROMBOCYTOPENIA, CHRONIC 01/04/2010  . PERIPHERAL NEUROPATHY 01/04/2010  . CAD, NATIVE VESSEL 01/04/2010  . ANXIETY DISORDER, HX OF 01/04/2010  . Intrinsic asthma, unspecified 03/16/2009  . ALLERGIC RHINITIS 02/18/2009  . DYSPNEA 02/18/2009  . DIABETES, TYPE 2 02/04/2009  . HYPERLIPIDEMIA 02/04/2009  . OBESITY 02/04/2009  . HYPERTENSION 02/04/2009    PT - End of Session Equipment Utilized During Treatment: Gait belt Activity Tolerance: Patient limited by fatigue;Patient tolerated treatment well General Behavior During Therapy: [facility] for tasks assessed/performed  Rachelle Hora, PTA 12/02/2013, 5:01 PM

## 2013-12-02 NOTE — Progress Notes (Signed)
Occupational Therapy Treatment Patient Details  Name: [name] [name] MRN: [id number] Date of Birth: 02/24/1941  Today's Date: 12/02/2013 Time: 0240-9735 OT Time Calculation (min): 37 min Manual therapy 3299-2426 22' Therapeutic exercises 8341-9622 15'  Visit#: 2 of 16  Re-eval: 12/21/13    Authorization: [employer] Medicare  Authorization Time Period: before 10th visit  Authorization Visit#: 2 of 10  Subjective  S:  Ive been doing my exercises.   Pain Assessment Currently in Pain?: Yes Pain Score: 4  Pain Location: Shoulder Pain Orientation: Right;Anterior Pain Type: Acute pain  Exercise/Treatments Supine Protraction: PROM;AAROM;10 reps Horizontal ABduction: PROM;AAROM;10 reps External Rotation: PROM;AAROM;10 reps Internal Rotation: PROM;AAROM;10 reps Flexion: PROM;AAROM;10 reps ABduction: PROM;AAROM;10 reps Seated Elevation: AROM;10 reps Extension: AROM;10 reps Row: AROM;10 reps Therapy Ball Flexion: 20 reps ABduction: 20 reps    Manual Therapy Manual Therapy: Myofascial release Myofascial Release: MFR and manual stretching to right upper arm, scapular, and shoulder region to decrease pain and restrictions and increase pain free mobility.  Scar release to surgical incison and associated tissue.   Occupational Therapy Assessment and Plan OT Assessment and Plan Clinical Impression Statement: A:  Patient reports increased mobility and less pain after MFR and manual stretching this date. OT Plan: P:  Add proximal shoulder strengthening in supine, thumbtacks, prot/ret//elev/dep for improved scapular stability and shoulder alignment.    Goals Home Exercise Program Pt/caregiver will Perform Home Exercise Program: For increased ROM;For increased strengthening Short Term Goals Time to Complete Short Term Goals: 4 weeks Short Term Goal 1: Paatient will be edcuated on HEP  Short Term Goal 1 Progress: Progressing toward goal Short Term Goal 2: Patient will  improve PROM in right shoulder and elbow to [facility] for increased ability to fix her hair.  Short Term Goal 2 Progress: Progressing toward goal Short Term Goal 3: Patient will decrease right shoulder pain to 3/10 when completing functional daily  activities.  Short Term Goal 3 Progress: Progressing toward goal Short Term Goal 4: Patient will improve right shoulder strength to 3+/5 for increased ability to manipulate bra and improve functional mobility/safety Short Term Goal 4 Progress: Progressing toward goal Short Term Goal 5: Patient will decrease right shoulder fascial restrictions to min in her right shoulder region.  Short Term Goal 5 Progress: Progressing toward goal Long Term Goals Time to Complete Long Term Goals: 8 weeks Long Term Goal 1: Patient will return to prior level of independence with all B/IADLs and leisure activities. Long Term Goal 1 Progress: Progressing toward goal Long Term Goal 2: Patient will improve right shoulder strength to >/= 4/5 for increased ability to perform  Long Term Goal 2 Progress: Progressing toward goal Long Term Goal 3: Patient will improve RUE AROM to [facility] for increased ability to reach into overhead cabinets.  Long Term Goal 3 Progress: Progressing toward goal Long Term Goal 4: Patient will decrease right shoulder pain to </=1/10 when completing functional activities.  Long Term Goal 4 Progress: Progressing toward goal Long Term Goal 5: Patient will decrease  fascial restrictions to trace in right shoulder region. Long Term Goal 5 Progress: Progressing toward goal  Problem List Patient Active Problem List   Diagnosis Date Noted  . Poor balance 10/14/2013  . Repeated falls 10/14/2013  . Acute encephalopathy 07/30/2013  . Encephalopathy, hepatic 07/30/2013  . Confusion 07/29/2013  . Pancreatitis 04/26/2013  . Edema of left lower extremity 04/14/2013  . Liver cirrhosis secondary to NASH 04/14/2013  . Generalized weakness 02/19/2013  . Acute blood  loss anemia 02/18/2013  . UTI (urinary tract infection) 02/14/2013  . Encephalopathy acute 02/14/2013  . Hyponatremia 02/14/2013  . Hypokalemia 02/14/2013  . Fracture of humeral shaft, right, closed 02/11/2013  . Multiple falls 01/26/2013  . Difficulty walking 01/26/2013  . Bilateral leg weakness 01/26/2013  . Personal history of fall 09/02/2012  . Dizziness 09/02/2012  . Frequent falls 08/08/2012  . Ataxia 08/08/2012  . Peripheral neuropathy 08/08/2012  . Preoperative clearance 06/06/2012  . 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 Anemia 04/08/2012  . Renal colic 60/67/7034  . Encephalopathy chronic 09/14/2011  . Posterior tibial tendon dysfunction 07/12/2011  . Hyperlipidemia 05/31/2011  . Essential hypertension 05/31/2011  . Uncontrolled type 2 diabetes mellitus with complication 03/52/4818  . GERD 09/29/2010  . Other chronic nonalcoholic liver disease 59/07/3111  . COLONIC POLYPS, HX OF 02/13/2010  . CIRRHOSIS 02/07/2010  . THROMBOCYTOPENIA, CHRONIC 01/04/2010  . PERIPHERAL NEUROPATHY 01/04/2010  . CAD, NATIVE VESSEL 01/04/2010  . ANXIETY DISORDER, HX OF 01/04/2010  . Intrinsic asthma, unspecified 03/16/2009  . ALLERGIC RHINITIS 02/18/2009  . DYSPNEA 02/18/2009  . DIABETES, TYPE 2 02/04/2009  . HYPERLIPIDEMIA 02/04/2009  . OBESITY 02/04/2009  . HYPERTENSION 02/04/2009    End of Session Activity Tolerance: Patient tolerated treatment well General Behavior During Therapy: [facility] for tasks assessed/performed  [place], OTR/L  12/02/2013, 3:21 PM

## 2013-12-03 ENCOUNTER — Ambulatory Visit (HOSPITAL_COMMUNITY)
Admission: RE | Admit: 2013-12-03 | Discharge: 2013-12-03 | Disposition: A | Discharge status: Home / Self Care | Payer: Medicare Other | Source: Ambulatory Visit | Attending: Family Medicine | Admitting: Family Medicine

## 2013-12-03 NOTE — Progress Notes (Addendum)
Physical Therapy Treatment (G-code update) Patient Details  Name: [name] [name] MRN: [id number] Date of Birth: 10-01-1941  Today's Date: 12/03/2013 Time: 1520-1600 PT Time Calculation (min): 40 min Charges: Therex x 8' [phone]) Gait training x 16' [phone]; [phone]) Self care x 10' [phone]; [phone]))  Visit#: 23 of 24  Re-eval: 12/14/13  Authorization: Medicare   Authorization Visit#: 18 of 20   Subjective: Symptoms/Limitations Symptoms: Pt states that she would like to walk more often at home. Pain Assessment Currently in Pain?: No/denies  Exercise/Treatments Aerobic Stationary Bike: NuStep 10' hills #3 L3 to improve strength and activity tolerance Standing Gait Training: Gait training with rolling walker 2 sets, 16 minutes total O2 sat above 90%  Physical Therapy Assessment and Plan PT Assessment and Plan Clinical Impression Statement: Treatment focus on improving activity tolerance and gait stability. Pt displays improved activity tolerance with gait training. Pt was able to walk max of  9' this session(16' total). Oxygen saturation stayed above 90 throughout session. Pt has complaint of back discomfort after gait training secondary to back extensor weakness. During rest breaks pt was educated on importance of HEP compliance and increasing activity levels. Pt plans to purchase rollator. Encouraged pt to increase ambulation at home with assistance from husband one she purchases walker. PT Plan: Continue to increase activity tolerance for ambulation goal of 30 minutes and progress balance/stability. Continue to monitor O2 levels throughout session.     Problem List Patient Active Problem List   Diagnosis Date Noted  . Poor balance 10/14/2013  . Repeated falls 10/14/2013  . Acute encephalopathy 07/30/2013  . Encephalopathy, hepatic 07/30/2013  . Confusion 07/29/2013  . Pancreatitis 04/26/2013  . Edema of left lower extremity 04/14/2013  . Liver cirrhosis  secondary to NASH 04/14/2013  . Generalized weakness 02/19/2013  . Acute blood loss anemia 02/18/2013  . UTI (urinary tract infection) 02/14/2013  . Encephalopathy acute 02/14/2013  . Hyponatremia 02/14/2013  . Hypokalemia 02/14/2013  . Fracture of humeral shaft, right, closed 02/11/2013  . Multiple falls 01/26/2013  . Difficulty walking 01/26/2013  . Bilateral leg weakness 01/26/2013  . Personal history of fall 09/02/2012  . Dizziness 09/02/2012  . Frequent falls 08/08/2012  . Ataxia 08/08/2012  . Peripheral neuropathy 08/08/2012  . Preoperative clearance 06/06/2012  . 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 Anemia 04/08/2012  . Renal colic 35/32/9924  . Encephalopathy chronic 09/14/2011  . Posterior tibial tendon dysfunction 07/12/2011  . Hyperlipidemia 05/31/2011  . Essential hypertension 05/31/2011  . Uncontrolled type 2 diabetes mellitus with complication 26/83/4196  . GERD 09/29/2010  . Other chronic nonalcoholic liver disease 22/29/7989  . COLONIC POLYPS, HX OF 02/13/2010  . CIRRHOSIS 02/07/2010  . THROMBOCYTOPENIA, CHRONIC 01/04/2010  . PERIPHERAL NEUROPATHY 01/04/2010  . CAD, NATIVE VESSEL 01/04/2010  . ANXIETY DISORDER, HX OF 01/04/2010  . Intrinsic asthma, unspecified 03/16/2009  . ALLERGIC RHINITIS 02/18/2009  . DYSPNEA 02/18/2009  . DIABETES, TYPE 2 02/04/2009  . HYPERLIPIDEMIA 02/04/2009  . OBESITY 02/04/2009  . HYPERTENSION 02/04/2009    PT - End of Session Equipment Utilized During Treatment: Gait belt Activity Tolerance: Patient limited by fatigue;Patient tolerated treatment well General Behavior During Therapy: [facility] for tasks assessed/performed  GP  Functional Limitation: Changing and maintaining body position  Changing and Maintaining Body Position Current Status (Q1194): At least 40 percent but less than 60 percent impaired, limited or restricted  Changing and Maintaining Body Position Goal Status (R7408): At least 20 percent  but less than 40  percent impaired, limited or restricted  Rachelle Hora, PTA  12/03/2013, 5:13 PM

## 2013-12-04 NOTE — Progress Notes (Signed)
Occupational Therapy Treatment Patient Details  Name: [name] [name] MRN: [id number] Date of Birth: [birth year]  Today's Date: 12/03/2013 Time: 6950-7225 OT Time Calculation (min): 41 min Manual  1432-1425 (26') TherExercises 1425-1513 (15')  Visit#: 3 of 16  Re-eval: 12/21/13    Authorization: [employer] Medicare  Authorization Time Period: before 10th visit  Authorization Visit#: 3 of 10  Subjective Symptoms/Limitations Symptoms: S:  Oh.... It felt good after therapy yesterday  Pain Assessment Currently in Pain?: Yes Pain Score: 4  Pain Location: Shoulder Pain Orientation: Right;Anterior Pain Type: Acute pain   Exercise/Treatments  12/03/13 1400  Shoulder Exercises: Supine  Protraction PROM;AAROM;10 reps  Horizontal ABduction PROM;AAROM;10 reps  External Rotation PROM;AAROM;10 reps  Internal Rotation PROM;AAROM;10 reps  Flexion PROM;AAROM;10 reps  ABduction PROM;AAROM;10 reps  Shoulder Exercises: Seated  Elevation AROM;10 reps  Extension AROM;10 reps  Row AROM;10 reps  Shoulder Exercises: Therapy Ball  Flexion 20 reps  ABduction 20 reps   Manual Therapy Manual Therapy: Myofascial release Myofascial Release: MFR and manual stretching to right upper arm, scapular, and shoulder region to decrease pain and restrictions and increase pain free mobility. Scar release to surgical incison and associated tissue.  Occupational Therapy Assessment and Plan OT Assessment and Plan Clinical Impression Statement: A:  Patient states she feels a vast improvement in shoulder tightness following treatments this  week.  good tolerance of range , MFR and HEP. OT Plan: P:  Add proximal shoulder strengthening in supine, thumbtacks, prot/ret//elev/dep for improved scapular stability and shoulder alignment.    Goals Short Term Goals Short Term Goal 1: Paatient will be edcuated on HEP  Short Term Goal 1 Progress: Progressing toward goal Short Term Goal 2: Patient will improve  PROM in right shoulder and elbow to [facility] for increased ability to fix her hair.  Short Term Goal 2 Progress: Progressing toward goal Short Term Goal 3: Patient will decrease right shoulder pain to 3/10 when completing functional daily  activities.  Short Term Goal 3 Progress: Progressing toward goal Short Term Goal 4: Patient will improve right shoulder strength to 3+/5 for increased ability to manipulate bra and improve functional mobility/safety Short Term Goal 4 Progress: Progressing toward goal Short Term Goal 5: Patient will decrease right shoulder fascial restrictions to min in her right shoulder region.  Short Term Goal 5 Progress: Progressing toward goal Long Term Goals Long Term Goal 1: Patient will return to prior level of independence with all B/IADLs and leisure activities. Long Term Goal 1 Progress: Progressing toward goal Long Term Goal 2: Patient will improve right shoulder strength to >/= 4/5 for increased ability to perform  Long Term Goal 2 Progress: Progressing toward goal Long Term Goal 3: Patient will improve RUE AROM to [facility] for increased ability to reach into overhead cabinets.  Long Term Goal 3 Progress: Progressing toward goal Long Term Goal 4: Patient will decrease right shoulder pain to </=1/10 when completing functional activities.  Long Term Goal 4 Progress: Progressing toward goal Long Term Goal 5: Patient will decrease  fascial restrictions to trace in right shoulder region. Long Term Goal 5 Progress: Progressing toward goal  Problem List Patient Active Problem List   Diagnosis Date Noted  . Poor balance 10/14/2013  . Repeated falls 10/14/2013  . Acute encephalopathy 07/30/2013  . Encephalopathy, hepatic 07/30/2013  . Confusion 07/29/2013  . Pancreatitis 04/26/2013  . Edema of left lower extremity 04/14/2013  . Liver cirrhosis secondary to NASH 04/14/2013  . Generalized weakness 02/19/2013  .  Acute blood loss anemia 02/18/2013  . UTI (urinary tract  infection) 02/14/2013  . Encephalopathy acute 02/14/2013  . Hyponatremia 02/14/2013  . Hypokalemia 02/14/2013  . Fracture of humeral shaft, right, closed 02/11/2013  . Multiple falls 01/26/2013  . Difficulty walking 01/26/2013  . Bilateral leg weakness 01/26/2013  . Personal history of fall 09/02/2012  . Dizziness 09/02/2012  . Frequent falls 08/08/2012  . Ataxia 08/08/2012  . Peripheral neuropathy 08/08/2012  . Preoperative clearance 06/06/2012  . 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 Anemia 04/08/2012  . Renal colic 15/95/3967  . Encephalopathy chronic 09/14/2011  . Posterior tibial tendon dysfunction 07/12/2011  . Hyperlipidemia 05/31/2011  . Essential hypertension 05/31/2011  . Uncontrolled type 2 diabetes mellitus with complication 28/97/9150  . GERD 09/29/2010  . Other chronic nonalcoholic liver disease 41/36/4383  . COLONIC POLYPS, HX OF 02/13/2010  . CIRRHOSIS 02/07/2010  . THROMBOCYTOPENIA, CHRONIC 01/04/2010  . PERIPHERAL NEUROPATHY 01/04/2010  . CAD, NATIVE VESSEL 01/04/2010  . ANXIETY DISORDER, HX OF 01/04/2010  . Intrinsic asthma, unspecified 03/16/2009  . ALLERGIC RHINITIS 02/18/2009  . DYSPNEA 02/18/2009  . DIABETES, TYPE 2 02/04/2009  . HYPERLIPIDEMIA 02/04/2009  . OBESITY 02/04/2009  . HYPERTENSION 02/04/2009    End of Session Activity Tolerance: Patient tolerated treatment well General Behavior During Therapy: [facility] for tasks assessed/performed  GO    Donney Rankins, OTR/L  12/03/2013, 3:09 PM

## 2013-12-07 ENCOUNTER — Ambulatory Visit (HOSPITAL_COMMUNITY)
Admission: RE | Admit: 2013-12-07 | Discharge: 2013-12-07 | Disposition: A | Discharge status: Home / Self Care | Payer: Medicare Other | Source: Ambulatory Visit | Attending: Specialist | Admitting: Specialist

## 2013-12-07 ENCOUNTER — Ambulatory Visit (HOSPITAL_COMMUNITY)
Admission: RE | Admit: 2013-12-07 | Discharge: 2013-12-07 | Disposition: A | Discharge status: Home / Self Care | Payer: Medicare Other | Source: Ambulatory Visit | Attending: Family Medicine | Admitting: Family Medicine

## 2013-12-07 NOTE — Progress Notes (Signed)
Physical Therapy Treatment Patient Details  Name: [name] [name] MRN: [id number] Date of Birth: February 17, 1941  Today's Date: 12/07/2013 Time: 1521-1550 PT Time Calculation (min): 29 min Charges: Gait x 95'(2841-3244) Self care x 01'(0272-5366)  Visit#: 20 of 24  Re-eval: 12/14/13  Authorization: Medicare  Authorization Visit#: 20 of 29   Subjective: Symptoms/Limitations Symptoms: Pt states that she is tired today. Pain Assessment Currently in Pain?: Yes Pain Score: 8  Pain Location: Shoulder Pain Orientation: Right;Anterior   Exercise/Treatments Aerobic [place]: Gait training with support harness 3 sets 5' total  Physical Therapy Assessment and Plan PT Assessment and Plan Clinical Impression Statement: Pt limited by fatigue and shoulder pain. Attempted gait training on TM with body weight support system. Pt requires 3 rest breaks within 5 minutes. Pt stated that she was not very active over the weekend. Educated pt on the importance of staying active on the days that she doesn't have therapy to maintain her activity tolerance. Pt requests to end session early secondary to fatigue and shoulder pain.  PT Plan: Continue to increase activity tolerance for ambulation goal of 30 minutes and progress balance/stability. Continue to monitor O2 levels throughout session.    Goals    Problem List Patient Active Problem List   Diagnosis Date Noted  . Poor balance 10/14/2013  . Repeated falls 10/14/2013  . Acute encephalopathy 07/30/2013  . Encephalopathy, hepatic 07/30/2013  . Confusion 07/29/2013  . Pancreatitis 04/26/2013  . Edema of left lower extremity 04/14/2013  . Liver cirrhosis secondary to NASH 04/14/2013  . Generalized weakness 02/19/2013  . Acute blood loss anemia 02/18/2013  . UTI (urinary tract infection) 02/14/2013  . Encephalopathy acute 02/14/2013  . Hyponatremia 02/14/2013  . Hypokalemia 02/14/2013  . Fracture of humeral shaft, right, closed 02/11/2013  .  Multiple falls 01/26/2013  . Difficulty walking 01/26/2013  . Bilateral leg weakness 01/26/2013  . Personal history of fall 09/02/2012  . Dizziness 09/02/2012  . Frequent falls 08/08/2012  . Ataxia 08/08/2012  . Peripheral neuropathy 08/08/2012  . Preoperative clearance 06/06/2012  . 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 Anemia 04/08/2012  . Renal colic 44/01/4741  . Encephalopathy chronic 09/14/2011  . Posterior tibial tendon dysfunction 07/12/2011  . Hyperlipidemia 05/31/2011  . Essential hypertension 05/31/2011  . Uncontrolled type 2 diabetes mellitus with complication 59/56/3875  . GERD 09/29/2010  . Other chronic nonalcoholic liver disease 64/33/2951  . COLONIC POLYPS, HX OF 02/13/2010  . CIRRHOSIS 02/07/2010  . THROMBOCYTOPENIA, CHRONIC 01/04/2010  . PERIPHERAL NEUROPATHY 01/04/2010  . CAD, NATIVE VESSEL 01/04/2010  . ANXIETY DISORDER, HX OF 01/04/2010  . Intrinsic asthma, unspecified 03/16/2009  . ALLERGIC RHINITIS 02/18/2009  . DYSPNEA 02/18/2009  . DIABETES, TYPE 2 02/04/2009  . HYPERLIPIDEMIA 02/04/2009  . OBESITY 02/04/2009  . HYPERTENSION 02/04/2009    PT - End of Session Equipment Utilized During Treatment: Gait belt Activity Tolerance: Patient limited by fatigue;Patient tolerated treatment well General Behavior During Therapy: [facility] for tasks assessed/performed   Rachelle Hora, PTA  12/07/2013, 5:41 PM

## 2013-12-07 NOTE — Progress Notes (Signed)
Occupational Therapy Treatment Patient Details  Name: [name] [name] MRN: [id number] Date of Birth: 15-Dec-1940  Today's Date: 12/07/2013 Time: 1440-1520 OT Time Calculation (min): 40 min Manual 1440-1455 (15') Therapeutic Exercises 1455-1520 (25')  Visit#: 4 of 16  Re-eval: 12/21/13    Authorization: [employer] Medicare  Authorization Time Period: before 10th visit  Authorization Visit#: 4 of 10  Subjective Symptoms/Limitations Symptoms: S:"I can tell its probably getting better-  when I layin the bed I can sort of push myself up, where I couldn't do that before." Pain Assessment Currently in Pain?: Yes Pain Score: 3   Precautions/Restrictions     Exercise/Treatments Supine Protraction: PROM;AAROM;10 reps Horizontal ABduction: PROM;AAROM;10 reps External Rotation: PROM;AAROM;10 reps Internal Rotation: PROM;AAROM;10 reps Flexion: PROM;AAROM;10 reps ABduction: PROM;AAROM;10 reps Seated Elevation: AROM;12 reps Extension: AROM;12 reps Row: AROM;12 reps ROM / Strengthening / Isometric Strengthening Proximal Shoulder Strengthening, Supine: up/down - 20 AAROM, 20 AROM; criss/cross - AROM 20 reps (w assist) (Pt w increased difficulty w 1 lb weights -try next session)   Manual Therapy Manual Therapy: Myofascial release Myofascial Release: MFR and manual stretching to right upper arm, scapular, and shoulder region to decrease pain and restrictions and increase pain free mobility. Scar release to surgical incison and associated tissue  Occupational Therapy Assessment and Plan OT Assessment and Plan Clinical Impression Statement: A: Pt tolerated treatment well. Pt required significant verbal and tactile cueing to complete exercises. OT Plan: P:  Review proximal shoulder strengthening in supine. Add thumbtacks, prot/ret//elev/dep for improved scapular stability and shoulder alignment.    Goals Short Term Goals Short Term Goal 1: Paatient will be edcuated on HEP  Short  Term Goal 1 Progress: Progressing toward goal Short Term Goal 2: Patient will improve PROM in right shoulder and elbow to [facility] for increased ability to fix her hair.  Short Term Goal 2 Progress: Progressing toward goal Short Term Goal 3: Patient will decrease right shoulder pain to 3/10 when completing functional daily  activities.  Short Term Goal 3 Progress: Progressing toward goal Short Term Goal 4: Patient will improve right shoulder strength to 3+/5 for increased ability to manipulate bra and improve functional mobility/safety Short Term Goal 4 Progress: Progressing toward goal Short Term Goal 5: Patient will decrease right shoulder fascial restrictions to min in her right shoulder region.  Short Term Goal 5 Progress: Progressing toward goal Long Term Goals Long Term Goal 1: Patient will return to prior level of independence with all B/IADLs and leisure activities. Long Term Goal 1 Progress: Progressing toward goal Long Term Goal 2: Patient will improve right shoulder strength to >/= 4/5 for increased ability to perform  Long Term Goal 2 Progress: Progressing toward goal Long Term Goal 3: Patient will improve RUE AROM to [facility] for increased ability to reach into overhead cabinets.  Long Term Goal 3 Progress: Progressing toward goal Long Term Goal 4: Patient will decrease right shoulder pain to </=1/10 when completing functional activities.  Long Term Goal 4 Progress: Progressing toward goal Long Term Goal 5: Patient will decrease  fascial restrictions to trace in right shoulder region. Long Term Goal 5 Progress: Progressing toward goal  Problem List Patient Active Problem List   Diagnosis Date Noted  . Poor balance 10/14/2013  . Repeated falls 10/14/2013  . Acute encephalopathy 07/30/2013  . Encephalopathy, hepatic 07/30/2013  . Confusion 07/29/2013  . Pancreatitis 04/26/2013  . Edema of left lower extremity 04/14/2013  . Liver cirrhosis secondary to NASH 04/14/2013  . Generalized  weakness 02/19/2013  . Acute blood loss anemia 02/18/2013  . UTI (urinary tract infection) 02/14/2013  . Encephalopathy acute 02/14/2013  . Hyponatremia 02/14/2013  . Hypokalemia 02/14/2013  . Fracture of humeral shaft, right, closed 02/11/2013  . Multiple falls 01/26/2013  . Difficulty walking 01/26/2013  . Bilateral leg weakness 01/26/2013  . Personal history of fall 09/02/2012  . Dizziness 09/02/2012  . Frequent falls 08/08/2012  . Ataxia 08/08/2012  . Peripheral neuropathy 08/08/2012  . Preoperative clearance 06/06/2012  . 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 Anemia 04/08/2012  . Renal colic 96/72/2773  . Encephalopathy chronic 09/14/2011  . Posterior tibial tendon dysfunction 07/12/2011  . Hyperlipidemia 05/31/2011  . Essential hypertension 05/31/2011  . Uncontrolled type 2 diabetes mellitus with complication 75/03/1070  . GERD 09/29/2010  . Other chronic nonalcoholic liver disease 25/24/7998  . COLONIC POLYPS, HX OF 02/13/2010  . CIRRHOSIS 02/07/2010  . THROMBOCYTOPENIA, CHRONIC 01/04/2010  . PERIPHERAL NEUROPATHY 01/04/2010  . CAD, NATIVE VESSEL 01/04/2010  . ANXIETY DISORDER, HX OF 01/04/2010  . Intrinsic asthma, unspecified 03/16/2009  . ALLERGIC RHINITIS 02/18/2009  . DYSPNEA 02/18/2009  . DIABETES, TYPE 2 02/04/2009  . HYPERLIPIDEMIA 02/04/2009  . OBESITY 02/04/2009  . HYPERTENSION 02/04/2009    End of Session Activity Tolerance: Patient tolerated treatment well General Behavior During Therapy: [facility] for tasks assessed/performed  GO   Bea Graff, MS, OTR/L [phone]  12/07/2013, 3:36 PM

## 2013-12-09 ENCOUNTER — Inpatient Hospital Stay (HOSPITAL_COMMUNITY): Admission: RE | Admit: 2013-12-09 | Payer: Medicare Other | Source: Ambulatory Visit

## 2013-12-09 ENCOUNTER — Ambulatory Visit (HOSPITAL_COMMUNITY): Payer: Medicare Other | Admitting: Physical Therapy

## 2013-12-11 ENCOUNTER — Ambulatory Visit (HOSPITAL_COMMUNITY)
Admission: RE | Admit: 2013-12-11 | Discharge: 2013-12-11 | Disposition: A | Discharge status: Home / Self Care | Payer: Medicare Other | Source: Ambulatory Visit | Attending: Family Medicine | Admitting: Family Medicine

## 2013-12-11 NOTE — Progress Notes (Signed)
Occupational Therapy Treatment Patient Details  Name: [name] [name] MRN: [id number] Date of Birth: 1941/07/22  Today's Date: 12/11/2013 Time: 8527-7824 OT Time Calculation (min): 42 min Manual therapy 1436-1500 24' Therapeutic exercises [phone] 35' Visit#: 5 of 16  Re-eval: 12/21/13    Authorization: [employer] Medicare  Authorization Time Period: before 10th visit  Authorization Visit#: 5 of 10  Subjective  S:  I dont feel well.  I have cirrohsis and it is bothering my stomach. Pain Assessment Currently in Pain?: Yes Pain Score: 3  Pain Location: Shoulder Pain Orientation: Right;Anterior Pain Type: Acute pain  Precautions/Restrictions     Exercise/Treatments Supine Protraction: PROM;10 reps;AAROM;15 reps Horizontal ABduction: PROM;10 reps;AAROM;15 reps External Rotation: PROM;10 reps;AAROM;15 reps Internal Rotation: PROM;10 reps;AAROM;15 reps Flexion: PROM;10 reps;AAROM;15 reps ABduction: PROM;10 reps;AAROM;15 reps;Limitations ABduction Limitations: to 90 degrees Seated Elevation: AROM;15 reps Extension: AROM;15 reps Row: AROM;15 reps Protraction: AAROM;10 reps;Limitations Protraction Limitations: significant scapular elevation present with movement Other Seated Exercises: functional reaching forward to 45 degrees and to 90 5 times with verbal cuing and tactile cuing to depress shoulder  ROM / Strengthening / Isometric Strengthening Proximal Shoulder Strengthening, Supine: 10 times each, without resting      Manual Therapy Manual Therapy: Myofascial release Myofascial Release: MFR and manual stretching to right upper arm, scapular, and shoulder region to decrease pain and restrictions and increase pain free mobility. Scar release to surgical incison and associated tissue  Occupational Therapy Assessment and Plan OT Assessment and Plan Clinical Impression Statement: A:  patient will functional PROM in supine, except for abduction, which is limited to  50%.   OT Plan: P:  decrease amount of scapular elevation present wtih functional reaching in seated - as patients body awareness improves.    Goals Short Term Goals Short Term Goal 1: Paatient will be edcuated on HEP  Short Term Goal 2: Patient will improve PROM in right shoulder and elbow to [facility] for increased ability to fix her hair.  Short Term Goal 3: Patient will decrease right shoulder pain to 3/10 when completing functional daily  activities.  Short Term Goal 4: Patient will improve right shoulder strength to 3+/5 for increased ability to manipulate bra and improve functional mobility/safety Short Term Goal 5: Patient will decrease right shoulder fascial restrictions to min in her right shoulder region.  Long Term Goals Long Term Goal 1: Patient will return to prior level of independence with all B/IADLs and leisure activities. Long Term Goal 2: Patient will improve right shoulder strength to >/= 4/5 for increased ability to perform  Long Term Goal 3: Patient will improve RUE AROM to [facility] for increased ability to reach into overhead cabinets.  Long Term Goal 4: Patient will decrease right shoulder pain to </=1/10 when completing functional activities.  Long Term Goal 5: Patient will decrease  fascial restrictions to trace in right shoulder region.  Problem List Patient Active Problem List   Diagnosis Date Noted  . Poor balance 10/14/2013  . Repeated falls 10/14/2013  . Acute encephalopathy 07/30/2013  . Encephalopathy, hepatic 07/30/2013  . Confusion 07/29/2013  . Pancreatitis 04/26/2013  . Edema of left lower extremity 04/14/2013  . Liver cirrhosis secondary to NASH 04/14/2013  . Generalized weakness 02/19/2013  . Acute blood loss anemia 02/18/2013  . UTI (urinary tract infection) 02/14/2013  . Encephalopathy acute 02/14/2013  . Hyponatremia 02/14/2013  . Hypokalemia 02/14/2013  . Fracture of humeral shaft, right, closed 02/11/2013  . Multiple falls 01/26/2013  . Difficulty  walking  01/26/2013  . Bilateral leg weakness 01/26/2013  . Personal history of fall 09/02/2012  . Dizziness 09/02/2012  . Frequent falls 08/08/2012  . Ataxia 08/08/2012  . Peripheral neuropathy 08/08/2012  . Preoperative clearance 06/06/2012  . 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 Anemia 04/08/2012  . Renal colic 40/97/3532  . Encephalopathy chronic 09/14/2011  . Posterior tibial tendon dysfunction 07/12/2011  . Hyperlipidemia 05/31/2011  . Essential hypertension 05/31/2011  . Uncontrolled type 2 diabetes mellitus with complication 99/24/2683  . GERD 09/29/2010  . Other chronic nonalcoholic liver disease 41/96/2229  . COLONIC POLYPS, HX OF 02/13/2010  . CIRRHOSIS 02/07/2010  . THROMBOCYTOPENIA, CHRONIC 01/04/2010  . PERIPHERAL NEUROPATHY 01/04/2010  . CAD, NATIVE VESSEL 01/04/2010  . ANXIETY DISORDER, HX OF 01/04/2010  . Intrinsic asthma, unspecified 03/16/2009  . ALLERGIC RHINITIS 02/18/2009  . DYSPNEA 02/18/2009  . DIABETES, TYPE 2 02/04/2009  . HYPERLIPIDEMIA 02/04/2009  . OBESITY 02/04/2009  . HYPERTENSION 02/04/2009    End of Session Activity Tolerance: Patient tolerated treatment well General Behavior During Therapy: [facility] for tasks assessed/performed  [place], OTR/L  12/11/2013, 3:27 PM

## 2013-12-14 ENCOUNTER — Ambulatory Visit (HOSPITAL_COMMUNITY)
Admission: RE | Admit: 2013-12-14 | Discharge: 2013-12-14 | Disposition: A | Discharge status: Home / Self Care | Payer: Medicare Other | Source: Ambulatory Visit | Attending: Family Medicine | Admitting: Family Medicine

## 2013-12-14 DIAGNOSIS — IMO0001 Reserved for inherently not codable concepts without codable children: Secondary | ICD-10-CM | POA: Diagnosis present

## 2013-12-14 DIAGNOSIS — Z9181 History of falling: Secondary | ICD-10-CM | POA: Insufficient documentation

## 2013-12-14 DIAGNOSIS — E119 Type 2 diabetes mellitus without complications: Secondary | ICD-10-CM | POA: Diagnosis not present

## 2013-12-14 DIAGNOSIS — R269 Unspecified abnormalities of gait and mobility: Secondary | ICD-10-CM | POA: Diagnosis not present

## 2013-12-14 DIAGNOSIS — I10 Essential (primary) hypertension: Secondary | ICD-10-CM | POA: Diagnosis not present

## 2013-12-14 NOTE — Progress Notes (Signed)
Physical Therapy Treatment Patient Details  Name: [name] [name] MRN: [id number] Date of Birth: Nov 03, 1941  Today's Date: 12/14/2013 Time: 4098-1191 PT Time Calculation (min): 51 min Visit#: 21 of 24  Re-eval: 12/14/13 Authorization: Medicare  Authorization Visit#: 21 of 29  Charges: gait 1307-1345 (38'), self care 1347-1355 (8)  Subjective: Symptoms/Limitations Symptoms: Pt without scheduled appointment today; only able to see physical therapy.  Pt states she tries to go without her wheelchair but her husband makes her use it. Pain Assessment Currently in Pain?: No/denies   Exercise/Treatments Standing Other Standing Knee Exercises: gait without AD 3 bouts X  500 feet each in 4 minutes.    Physical Therapy Assessment and Plan PT Assessment and Plan Clinical Impression Statement: Pt ambulated 3 bouts, 500 feet each with noted fatigue and SOB.  O2 sats monitored throughout treatment staying between 90-99%. Spoke to spouse at length regarding progression and home activities.  Encouraged pt/spouse to quit using the wheelchair and ambulate more to increase activity tolerance.  Suggested pt use RW in community.  Patient and spouse agreed to increasing activity/compliance with HEP.   PT Plan: Continue to increase activity tolerance for ambulation goal of 30 minutes and progress balance/stability. Continue to monitor O2 levels throughout session.     Problem List Patient Active Problem List   Diagnosis Date Noted  . Poor balance 10/14/2013  . Repeated falls 10/14/2013  . Acute encephalopathy 07/30/2013  . Encephalopathy, hepatic 07/30/2013  . Confusion 07/29/2013  . Pancreatitis 04/26/2013  . Edema of left lower extremity 04/14/2013  . Liver cirrhosis secondary to NASH 04/14/2013  . Generalized weakness 02/19/2013  . Acute blood loss anemia 02/18/2013  . UTI (urinary tract infection) 02/14/2013  . Encephalopathy acute 02/14/2013  . Hyponatremia 02/14/2013  . Hypokalemia  02/14/2013  . Fracture of humeral shaft, right, closed 02/11/2013  . Multiple falls 01/26/2013  . Difficulty walking 01/26/2013  . Bilateral leg weakness 01/26/2013  . Personal history of fall 09/02/2012  . Dizziness 09/02/2012  . Frequent falls 08/08/2012  . Ataxia 08/08/2012  . Peripheral neuropathy 08/08/2012  . Preoperative clearance 06/06/2012  . 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 Anemia 04/08/2012  . Renal colic 47/82/9562  . Encephalopathy chronic 09/14/2011  . Posterior tibial tendon dysfunction 07/12/2011  . Hyperlipidemia 05/31/2011  . Essential hypertension 05/31/2011  . Uncontrolled type 2 diabetes mellitus with complication 13/06/6577  . GERD 09/29/2010  . Other chronic nonalcoholic liver disease 46/96/2952  . COLONIC POLYPS, HX OF 02/13/2010  . CIRRHOSIS 02/07/2010  . THROMBOCYTOPENIA, CHRONIC 01/04/2010  . PERIPHERAL NEUROPATHY 01/04/2010  . CAD, NATIVE VESSEL 01/04/2010  . ANXIETY DISORDER, HX OF 01/04/2010  . Intrinsic asthma, unspecified 03/16/2009  . ALLERGIC RHINITIS 02/18/2009  . DYSPNEA 02/18/2009  . DIABETES, TYPE 2 02/04/2009  . HYPERLIPIDEMIA 02/04/2009  . OBESITY 02/04/2009  . HYPERTENSION 02/04/2009    PT - End of Session Equipment Utilized During Treatment: Gait belt Activity Tolerance: Patient limited by fatigue;Patient tolerated treatment well General Behavior During Therapy: [facility] for tasks assessed/performed   Teena Irani, PTA/CLT 12/14/2013, 5:23 PM

## 2013-12-16 ENCOUNTER — Ambulatory Visit (HOSPITAL_COMMUNITY)
Admission: RE | Admit: 2013-12-16 | Discharge: 2013-12-16 | Disposition: A | Discharge status: Home / Self Care | Payer: Medicare Other | Source: Ambulatory Visit | Attending: Family Medicine | Admitting: Family Medicine

## 2013-12-16 ENCOUNTER — Ambulatory Visit (HOSPITAL_COMMUNITY)
Admission: RE | Admit: 2013-12-16 | Discharge: 2013-12-16 | Disposition: A | Discharge status: Home / Self Care | Payer: Medicare Other | Source: Ambulatory Visit | Attending: Specialist | Admitting: Specialist

## 2013-12-16 DIAGNOSIS — IMO0001 Reserved for inherently not codable concepts without codable children: Secondary | ICD-10-CM | POA: Diagnosis not present

## 2013-12-16 DIAGNOSIS — R296 Repeated falls: Secondary | ICD-10-CM

## 2013-12-16 DIAGNOSIS — R2689 Other abnormalities of gait and mobility: Secondary | ICD-10-CM

## 2013-12-16 NOTE — Progress Notes (Signed)
Occupational Therapy Treatment Patient Details  Name: [name] [name] MRN: [id number] Date of Birth: Aug 17, 1941  Today's Date: 12/16/2013 Time: 7322-0254 OT Time Calculation (min): 30 min Manual therapy 2706-2376 21' Therapeutic exercises 2831-5176 9'  Visit#: 6 of 16  Re-eval: 12/21/13    Authorization: [employer] Medicare  Authorization Time Period: before 10th visit  Authorization Visit#: 6 of 10  Subjective  S: Its hurting a little bit.  I put some maxifreeze on it before I came.  Pain Assessment Currently in Pain?: Yes Pain Score: 5  Pain Location: Shoulder Pain Orientation: Right;Anterior Pain Type: Acute pain  Precautions/Restrictions   progress as tolerated  Exercise/Treatments Supine Protraction: PROM;10 reps;AAROM;15 reps Horizontal ABduction: PROM;10 reps;AAROM;15 reps External Rotation: PROM;10 reps;AAROM;15 reps Internal Rotation: PROM;10 reps;AAROM;15 reps Flexion: PROM;10 reps;AAROM;15 reps ABduction: PROM;10 reps;AAROM;15 reps;Limitations ABduction Limitations: to 90 degrees    Manual Therapy Manual Therapy: Myofascial release Myofascial Release: MFR and manual stretching to right upper arm, scapular, and shoulder region to decrease pain and restrictions and increase pain free mobility. Scar release to surgical incison and associated tissue  Occupational Therapy Assessment and Plan OT Assessment and Plan Clinical Impression Statement: A:  Pain decrased from 5/10 to 2/10 in her right shoulder after MFR intervention. OT Plan: P:  attempt AROM in supine.     Goals Short Term Goals Short Term Goal 1: Paatient will be edcuated on HEP  Short Term Goal 1 Progress: Progressing toward goal Short Term Goal 2: Patient will improve PROM in right shoulder and elbow to [facility] for increased ability to fix her hair.  Short Term Goal 2 Progress: Progressing toward goal Short Term Goal 3: Patient will decrease right shoulder pain to 3/10 when completing  functional daily  activities.  Short Term Goal 3 Progress: Progressing toward goal Short Term Goal 4: Patient will improve right shoulder strength to 3+/5 for increased ability to manipulate bra and improve functional mobility/safety Short Term Goal 4 Progress: Progressing toward goal Short Term Goal 5: Patient will decrease right shoulder fascial restrictions to min in her right shoulder region.  Short Term Goal 5 Progress: Progressing toward goal Long Term Goals Long Term Goal 1: Patient will return to prior level of independence with all B/IADLs and leisure activities. Long Term Goal 1 Progress: Progressing toward goal Long Term Goal 2: Patient will improve right shoulder strength to >/= 4/5 for increased ability to perform  Long Term Goal 2 Progress: Progressing toward goal Long Term Goal 3: Patient will improve RUE AROM to [facility] for increased ability to reach into overhead cabinets.  Long Term Goal 3 Progress: Progressing toward goal Long Term Goal 4: Patient will decrease right shoulder pain to </=1/10 when completing functional activities.  Long Term Goal 4 Progress: Progressing toward goal Long Term Goal 5: Patient will decrease  fascial restrictions to trace in right shoulder region. Long Term Goal 5 Progress: Progressing toward goal  Problem List Patient Active Problem List   Diagnosis Date Noted  . Poor balance 10/14/2013  . Repeated falls 10/14/2013  . Acute encephalopathy 07/30/2013  . Encephalopathy, hepatic 07/30/2013  . Confusion 07/29/2013  . Pancreatitis 04/26/2013  . Edema of left lower extremity 04/14/2013  . Liver cirrhosis secondary to NASH 04/14/2013  . Generalized weakness 02/19/2013  . Acute blood loss anemia 02/18/2013  . UTI (urinary tract infection) 02/14/2013  . Encephalopathy acute 02/14/2013  . Hyponatremia 02/14/2013  . Hypokalemia 02/14/2013  . Fracture of humeral shaft, right, closed 02/11/2013  . Multiple  falls 01/26/2013  . Difficulty walking  01/26/2013  . Bilateral leg weakness 01/26/2013  . Personal history of fall 09/02/2012  . Dizziness 09/02/2012  . Frequent falls 08/08/2012  . Ataxia 08/08/2012  . Peripheral neuropathy 08/08/2012  . Preoperative clearance 06/06/2012  . 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 Anemia 04/08/2012  . Renal colic 83/41/9622  . Encephalopathy chronic 09/14/2011  . Posterior tibial tendon dysfunction 07/12/2011  . Hyperlipidemia 05/31/2011  . Essential hypertension 05/31/2011  . Uncontrolled type 2 diabetes mellitus with complication 29/79/8921  . GERD 09/29/2010  . Other chronic nonalcoholic liver disease 19/41/7408  . COLONIC POLYPS, HX OF 02/13/2010  . CIRRHOSIS 02/07/2010  . THROMBOCYTOPENIA, CHRONIC 01/04/2010  . PERIPHERAL NEUROPATHY 01/04/2010  . CAD, NATIVE VESSEL 01/04/2010  . ANXIETY DISORDER, HX OF 01/04/2010  . Intrinsic asthma, unspecified 03/16/2009  . ALLERGIC RHINITIS 02/18/2009  . DYSPNEA 02/18/2009  . DIABETES, TYPE 2 02/04/2009  . HYPERLIPIDEMIA 02/04/2009  . OBESITY 02/04/2009  . HYPERTENSION 02/04/2009    End of Session Activity Tolerance: Patient tolerated treatment well General Behavior During Therapy: [facility] for tasks assessed/performed  [place], OTR/L  12/16/2013, 1:48 PM

## 2013-12-16 NOTE — Progress Notes (Signed)
Physical Therapy Re-evaluation/Treatment  Patient Details  Name: [name] [name] MRN: [id number] Date of Birth: May 27, 1941  Today's Date: 12/16/2013 Time: 5631-4970 PT Time Calculation (min): 43 min Charge: MMT 2637-8588, Physical performance testing (BERG) 1352-1400, Gait [phone]           Visit#: 22 of 30  Re-eval: 01/13/14 Assessment Diagnosis: gt instability Prior Therapy: HH for two months  Authorization: Medicare    Authorization Time Period: Gcode complete 19th visit  Authorization Visit#: 22 of 29   Subjective Symptoms/Limitations Symptoms: Pt reported pain free following occupational therapy today.  Pt walked into dept without AD and no wheelchair today. Pain Assessment Currently in Pain?: No/denies  Precautions/Restrictions  Precautions Precautions: Fall  Sensation/Coordination/Flexibility/Functional Tests Functional Tests Functional Tests: foto functional status 60% (was 44%) , limitation 40% (was 56%)  Assessment RLE Strength Right Hip Extension: 3+/5 (was 3/5) Right Hip ABduction:  (4-/5 was 3+/5) LLE Strength Left Hip Extension: 3+/5 (was 3/5) Left Hip ABduction: 4/5 (was 3+/5)  Exercise/Treatments Mobility/Balance  Berg Balance Test Sit to Stand: Able to stand without using hands and stabilize independently Standing Unsupported: Able to stand safely 2 minutes Sitting with Back Unsupported but Feet Supported on Floor or Stool: Able to sit safely and securely 2 minutes Stand to Sit: Sits safely with minimal use of hands Transfers: Able to transfer safely, minor use of hands Standing Unsupported with Eyes Closed: Able to stand 10 seconds safely Standing Ubsupported with Feet Together: Able to place feet together independently and stand for 1 minute with supervision (was 3) From Standing, Reach Forward with Outstretched Arm: Can reach forward >12 cm safely (5") (was 3) From Standing Position, Pick up Object from Floor: Able to pick up shoe safely and  easily (was 3) From Standing Position, Turn to Look Behind Over each Shoulder: Looks behind one side only/other side shows less weight shift (was 3) Turn 360 Degrees: Able to turn 360 degrees safely in 4 seconds or less (was 3) Standing Unsupported, Alternately Place Feet on Step/Stool: Able to stand independently and complete 8 steps >20 seconds (was 2) Standing Unsupported, One Foot in Front: Able to take small step independently and hold 30 seconds (was 3) Standing on One Leg: Able to lift leg independently and hold equal to or more than 3 seconds (was 2) Total Score: 48   Standing Gait Training: Gait training no AD 2 sets x 607 feet 5 minutes each with long rest breaks inbetween    Physical Therapy Assessment and Plan PT Assessment and Plan Clinical Impression Statement: Re-eval complete with the following findings:  Pt independent with HEP and able to describe proper technique with all exercises at home.  Pt able to ambulate 607 feet in 5 minutes x 2 sets today with long periods of rest break required.  Pt c/o LBP during gait due to weak core mm. Noted multiple LOB during gait training today, pt able to recover with min assistnace required.  O2 sat  range form 95-97%.  Improved BERG balance testing by 2 points.  Pt encouraged to coniinue ambulating and HEP for activity tolerance.  Pt educated on importance of core musculature to improve posture, discussed with PT and added new goal to improve core muscualture and posture to assist with gait training and activity tolerance.  Pt agreed to new goal.   PT Plan: Recommend continuing OPPT for 4 more weeks to improve activity tolerance and gait mechanics.  Continue to monitor O2 sats.  Begin core strengthening exercises to improve  posture with gait.       Goals Home Exercise Program Pt/caregiver will Perform Home Exercise Program: For increased strengthening PT Short Term Goals PT Short Term Goal 1: Pt to be able to complete 10 sit to stand in 30  seconds using one hand hold for increased power to assist in hills and steps PT Short Term Goal 2: Pt to be ambulating with a walker x 15 minutes everyday PT Short Term Goal 2 - Progress: Progressing toward goal PT Short Term Goal 3: Pt Berg to improve by 5 pt to improve safety and decrease risk of falling PT Long Term Goals Time to Complete Long Term Goals: 4 weeks PT Long Term Goal 1: Pt to be able to complet 5 sit to stand with no hand assist for improved balance and power PT Long Term Goal 2: Pt to be ambulating with a walker for 30 minutes. Long Term Goal 3: Berg score to be improved by 10 pts to improve safety and reduce risk of falling Long Term Goal 4: Pt to have had no falls PT Long Term Goal 5: Pt to be doing light activites while standing ie dishes, folding clothes... Additional PT Long Term Goals?: Yes PT Long Term Goal 6: Pt improve core musculature and posture to report decrease LBP scale 3/10 during gait for 15 minutes  (new goal set 12/16/2013)  Problem List Patient Active Problem List   Diagnosis Date Noted  . Poor balance 10/14/2013  . Repeated falls 10/14/2013  . Acute encephalopathy 07/30/2013  . Encephalopathy, hepatic 07/30/2013  . Confusion 07/29/2013  . Pancreatitis 04/26/2013  . Edema of left lower extremity 04/14/2013  . Liver cirrhosis secondary to NASH 04/14/2013  . Generalized weakness 02/19/2013  . Acute blood loss anemia 02/18/2013  . UTI (urinary tract infection) 02/14/2013  . Encephalopathy acute 02/14/2013  . Hyponatremia 02/14/2013  . Hypokalemia 02/14/2013  . Fracture of humeral shaft, right, closed 02/11/2013  . Multiple falls 01/26/2013  . Difficulty walking 01/26/2013  . Bilateral leg weakness 01/26/2013  . Personal history of fall 09/02/2012  . Dizziness 09/02/2012  . Frequent falls 08/08/2012  . Ataxia 08/08/2012  . Peripheral neuropathy 08/08/2012  . Preoperative clearance 06/06/2012  . 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 Anemia 04/08/2012  . Renal colic 80/99/8338  . Encephalopathy chronic 09/14/2011  . Posterior tibial tendon dysfunction 07/12/2011  . Hyperlipidemia 05/31/2011  . Essential hypertension 05/31/2011  . Uncontrolled type 2 diabetes mellitus with complication 25/03/3975  . GERD 09/29/2010  . Other chronic nonalcoholic liver disease 73/41/9379  . COLONIC POLYPS, HX OF 02/13/2010  . CIRRHOSIS 02/07/2010  . THROMBOCYTOPENIA, CHRONIC 01/04/2010  . PERIPHERAL NEUROPATHY 01/04/2010  . CAD, NATIVE VESSEL 01/04/2010  . ANXIETY DISORDER, HX OF 01/04/2010  . Intrinsic asthma, unspecified 03/16/2009  . ALLERGIC RHINITIS 02/18/2009  . DYSPNEA 02/18/2009  . DIABETES, TYPE 2 02/04/2009  . HYPERLIPIDEMIA 02/04/2009  . OBESITY 02/04/2009  . HYPERTENSION 02/04/2009    PT - End of Session Equipment Utilized During Treatment: Gait belt Activity Tolerance: Patient limited by fatigue;Patient tolerated treatment well General Behavior During Therapy: [facility] for tasks assessed/performed  GP    Aldona Lento 12/16/2013, 4:56 PM  Physician Documentation Your signature is required to indicate approval of the treatment plan as stated above.  Please sign and either send electronically or make a copy of this report for your files and return this physician signed original.   Please mark one 1.__approve of plan  2. ___approve of plan with the following conditions.   ______________________________                                                          _____________________ Physician Signature                                                                                                             Date  

## 2013-12-18 ENCOUNTER — Ambulatory Visit (HOSPITAL_COMMUNITY)
Admission: RE | Admit: 2013-12-18 | Discharge: 2013-12-18 | Disposition: A | Discharge status: Home / Self Care | Payer: Medicare Other | Source: Ambulatory Visit | Attending: Family Medicine | Admitting: Family Medicine

## 2013-12-18 NOTE — Progress Notes (Signed)
Occupational Therapy Treatment Patient Details  Name: [name] [name] MRN: [id number] Date of Birth: [birth year]  Today's Date: 12/18/2013 Time: 1245-8099 OT Time Calculation (min): 39 min Manual 1524-1540 (16') Therapeutic Exercises 1540-1603 (23')  Visit#: 7 of 16  Re-eval: 12/21/13    Authorization: [employer] Medicare  Authorization Time Period: before 10th visit  Authorization Visit#: 7 of 10  Subjective Symptoms/Limitations Symptoms: "I've been putting htat Maxi freeze on it - its not really been working." Pain Assessment Pain Score: 6  Pain Location: Shoulder Pain Orientation: Right;Posterior Pain Type: Acute pain (aching)    Exercise/Treatments Supine Protraction: PROM;10 reps;AROM Horizontal ABduction: PROM;AROM;10 reps External Rotation: PROM;AROM;10 reps Internal Rotation: PROM;10 reps;AROM Flexion: PROM;AROM;10 reps ABduction: PROM;AROM;10 reps ABduction Limitations: to 90 degrees ROM / Strengthening / Isometric Strengthening Proximal Shoulder Strengthening, Supine: 15 reps each x2 sets (1st with facilitation, 2nd with therapist ahnds to limit ROM); up/down and crisscross   Manual Therapy Manual Therapy: Myofascial release Myofascial Release: MFR and manual stretching to right upper arm, scapular, and shoulder region to decrease pain and restrictions and increase pain free mobility. Scar release to surgical incison and associated tissue  Occupational Therapy Assessment and Plan OT Assessment and Plan Clinical Impression Statement: A: Pt tolerated well transition to AROM with 1 lb weights. She had significant popping her her right shoulder with flexion, but pt indicated that movements in AROM felt similar to those of AAROM. Pt continues to need significant cueing for proximal shoulder strengthening exercises. OT Plan: P: Re-evaluation. Follow up on supine AROM.   Goals Short Term Goals Short Term Goal 1: Paatient will be edcuated on HEP  Short Term  Goal 1 Progress: Progressing toward goal Short Term Goal 2: Patient will improve PROM in right shoulder and elbow to [facility] for increased ability to fix her hair.  Short Term Goal 2 Progress: Progressing toward goal Short Term Goal 3: Patient will decrease right shoulder pain to 3/10 when completing functional daily  activities.  Short Term Goal 3 Progress: Progressing toward goal Short Term Goal 4: Patient will improve right shoulder strength to 3+/5 for increased ability to manipulate bra and improve functional mobility/safety Short Term Goal 4 Progress: Progressing toward goal Short Term Goal 5: Patient will decrease right shoulder fascial restrictions to min in her right shoulder region.  Short Term Goal 5 Progress: Progressing toward goal Long Term Goals Long Term Goal 1: Patient will return to prior level of independence with all B/IADLs and leisure activities. Long Term Goal 1 Progress: Progressing toward goal Long Term Goal 2: Patient will improve right shoulder strength to >/= 4/5 for increased ability to perform  Long Term Goal 2 Progress: Progressing toward goal Long Term Goal 3: Patient will improve RUE AROM to [facility] for increased ability to reach into overhead cabinets.  Long Term Goal 3 Progress: Progressing toward goal Long Term Goal 4: Patient will decrease right shoulder pain to </=1/10 when completing functional activities.  Long Term Goal 4 Progress: Progressing toward goal Long Term Goal 5: Patient will decrease  fascial restrictions to trace in right shoulder region. Long Term Goal 5 Progress: Progressing toward goal  Problem List Patient Active Problem List   Diagnosis Date Noted  . Poor balance 10/14/2013  . Repeated falls 10/14/2013  . Acute encephalopathy 07/30/2013  . Encephalopathy, hepatic 07/30/2013  . Confusion 07/29/2013  . Pancreatitis 04/26/2013  . Edema of left lower extremity 04/14/2013  . Liver cirrhosis secondary to NASH 04/14/2013  . Generalized  weakness  02/19/2013  . Acute blood loss anemia 02/18/2013  . UTI (urinary tract infection) 02/14/2013  . Encephalopathy acute 02/14/2013  . Hyponatremia 02/14/2013  . Hypokalemia 02/14/2013  . Fracture of humeral shaft, right, closed 02/11/2013  . Multiple falls 01/26/2013  . Difficulty walking 01/26/2013  . Bilateral leg weakness 01/26/2013  . Personal history of fall 09/02/2012  . Dizziness 09/02/2012  . Frequent falls 08/08/2012  . Ataxia 08/08/2012  . Peripheral neuropathy 08/08/2012  . Preoperative clearance 06/06/2012  . 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 Anemia 04/08/2012  . Renal colic 56/97/9480  . Encephalopathy chronic 09/14/2011  . Posterior tibial tendon dysfunction 07/12/2011  . Hyperlipidemia 05/31/2011  . Essential hypertension 05/31/2011  . Uncontrolled type 2 diabetes mellitus with complication 16/55/3748  . GERD 09/29/2010  . Other chronic nonalcoholic liver disease 27/05/8674  . COLONIC POLYPS, HX OF 02/13/2010  . CIRRHOSIS 02/07/2010  . THROMBOCYTOPENIA, CHRONIC 01/04/2010  . PERIPHERAL NEUROPATHY 01/04/2010  . CAD, NATIVE VESSEL 01/04/2010  . ANXIETY DISORDER, HX OF 01/04/2010  . Intrinsic asthma, unspecified 03/16/2009  . ALLERGIC RHINITIS 02/18/2009  . DYSPNEA 02/18/2009  . DIABETES, TYPE 2 02/04/2009  . HYPERLIPIDEMIA 02/04/2009  . OBESITY 02/04/2009  . HYPERTENSION 02/04/2009    End of Session Activity Tolerance: Patient tolerated treatment well General Behavior During Therapy: [facility] for tasks assessed/performed  GO    Bea Graff, MS, OTR/L [phone]  12/18/2013, 4:10 PM

## 2013-12-21 ENCOUNTER — Ambulatory Visit (HOSPITAL_COMMUNITY): Payer: Medicare Other | Admitting: Occupational Therapy

## 2013-12-21 ENCOUNTER — Telehealth (HOSPITAL_COMMUNITY): Payer: Self-pay

## 2013-12-21 DIAGNOSIS — IMO0001 Reserved for inherently not codable concepts without codable children: Secondary | ICD-10-CM | POA: Diagnosis not present

## 2013-12-23 ENCOUNTER — Inpatient Hospital Stay (HOSPITAL_COMMUNITY): Admission: RE | Admit: 2013-12-23 | Payer: Medicare Other | Source: Ambulatory Visit

## 2013-12-25 ENCOUNTER — Ambulatory Visit (HOSPITAL_COMMUNITY)
Admission: RE | Admit: 2013-12-25 | Discharge: 2013-12-25 | Disposition: A | Discharge status: Home / Self Care | Payer: Medicare Other | Source: Ambulatory Visit | Attending: Family Medicine | Admitting: Family Medicine

## 2013-12-25 DIAGNOSIS — IMO0001 Reserved for inherently not codable concepts without codable children: Secondary | ICD-10-CM | POA: Diagnosis not present

## 2013-12-25 NOTE — Evaluation (Signed)
Occupational Therapy Re-Evaluation  Patient Details  Name: [name] [name] MRN: [id number] Date of Birth: Nov 27, 1940  Today's Date: 12/25/2013 Time: 2993-7169 OT Time Calculation (min): 45 min ROM Testing 1435-1455 (20') Manual 1455-1510 (15') Therapeutic Exercises 1510-1420 (10')  Visit#: 8 of 16  Re-eval: 01/22/14  Assessment Diagnosis: fall s/p right proximal humerus fracture Surgical Date: 02/12/13 Next MD Visit: end of march Prior Therapy: acute, SNF, home health now outpatient   Authorization: [employer] Medicare  Authorization Time Period: before 17th visit  Authorization Visit#: 8 of 17   Past Medical History:  Past Medical History  Diagnosis Date  . CAD in native artery   . Thrombocytopenia, unspecified   . Unspecified hereditary and idiopathic peripheral neuropathy   . Unspecified fall   . Other and unspecified hyperlipidemia   . Unspecified essential hypertension   . Obesity, unspecified   . Shortness of breath   . Personal history of neurosis   . Intrinsic asthma, unspecified   . Allergic rhinitis, cause unspecified   . Cirrhosis of liver     NASH, afp on 06/17/12 =3.7, per pt she had hep B vaccines in 1996, hep A status unknown  . Colon adenomas 03/08/10    tcs by Dr. Gala Romney  . Hyperplastic polyps of stomach 03/08/10    tcs by Dr. Dudley Major  . Chronic gastritis 03/09/11    egd by Dr. Gala Romney  . History of hemorrhoids 03/08/10    tcs- internal and external  . Diverticula of colon 03/08/10    L side  . Hiatal hernia 03/08/10  . Esophagitis, erosive 03/08/10  . GERD (gastroesophageal reflux disease)   . Wears glasses   . Type II or unspecified type diabetes mellitus without mention of complication, not stated as uncontrolled   . Vertigo   . Pancreatitis   . Bilateral leg weakness    Past Surgical History:  Past Surgical History  Procedure Laterality Date  . Hysterectomy and btl      s/p  . Abdominal hysterectomy    . Tumor excision  2003    rt arm and  left foot  . Colonoscopy  03/08/10    Dr. [name]-->ext/int hemorrhoids, anal paipilla, rectal polyps, desc polyps, cecal polyp, left-sided diverticula. suboptiomal prep. next TCS 02/2015. multiple adenomas  . Esophagogastroduodenoscopy  03/08/10    Dr. [name] erosive RE, small hh, antral erosions, small 1cm are of mucosal indentation along gastric body of doubtful significance, cystic nodularity of hypophyarynx, base of tongue, base of epiglottis. benign gastric biopsies  . Tubal ligation    . Esophagogastroduodenoscopy  10/08/2011    Dr. [name] esophageal varices, antral erosion. next egd 09/2013  . Foot surgery  2003    lt foot  . Eye surgery      cataracts bilateral  . Orif humerus fracture Right 02/17/2013    Procedure: OPEN REDUCTION INTERNAL FIXATION (ORIF) RIGHT PROXIMAL HUMERUS FRACTURE;  Surgeon: [name], MD;  Location: [facility];  Service: Orthopedics;  Laterality: Right;  . Partial gastrectomy      family denies  . Breast surgery    . Cardiac catheterization  02/25/2007    Dr. [name]:  normal LV systolic function, mild irregularities of LAD    Subjective Symptoms/Limitations Symptoms: "I'm feeling better, just weak" Pain Assessment Currently in Pain?: No/denies  Precautions/Restrictions  Precautions Precautions: Fall Restrictions Weight Bearing Restrictions: No Other Position/Activity Restrictions: patient has bone sitmulator to wear on RUE 2x/daily   Balance Screening Balance Screen Has the patient  fallen in the past 6 months:  (no more falls than previous eval)  Prior [place] expects to be discharged to:: Private residence Living Arrangements: Spouse/significant other Prior Function Level of Independence: Independent with basic ADLs Driving: No Vocation: Retired Leisure: Hobbies-yes (Comment) Comments: TV, puzzles, go out with grandchildren/family, going shopping   Assessment ADL/Vision/Perception ADL ADL Comments:  improved ability to fasten bra (no problems), no difficulty washing hair, no difficulties with kitchen tasks (pt made several pies last weekend), improved reaching to shoulder height, but still some diffiuclty Dominant Hand: Right Vision - History Baseline Vision: Wears glasses only for reading Vision - Assessment Eye Alignment: Within Functional Limits  Cognition/Observation Cognition Overall Cognitive Status: Within Functional Limits for tasks assessed Arousal/Alertness: Awake/alert Orientation Level: Oriented X4 Observation/Other Assessments Observations: Pt fatigued, short of breath due to recent illness. O2 at 95%  Sensation/Coordination/Edema Sensation Light Touch: Appears Intact Coordination Gross Motor Movements are Fluid and Coordinated: Yes Fine Motor Movements are Fluid and Coordinated: Yes  Additional Assessments RUE AROM (degrees) RUE Overall AROM Comments: measurement taken in sitting  Right Shoulder Flexion: 95 Degrees (87) Right Shoulder ABduction: 105 Degrees (85) Right Shoulder Internal Rotation: 80 Degrees (82) Right Shoulder External Rotation: 63 Degrees (54) RUE Strength RUE Overall Strength Comments: assessed in sitting  Right Shoulder Flexion: 3-/5 Right Shoulder ABduction: 3-/5 Right Shoulder Internal Rotation:  (4-/5 (3/5)) Right Shoulder External Rotation: 3-/5 LUE Assessment LUE Assessment: Within Functional Limits     Exercise/Treatments Supine Protraction: AROM;10 reps External Rotation: AAROM;10 reps Internal Rotation: AAROM;10 reps Flexion: AROM;10 reps ABduction: AAROM;10 reps (AROM to 90, with facilitation for beyond 90 degrees)    Manual Therapy Manual Therapy: Myofascial release Myofascial Release: MFR and manual stretching to right upper arm, scapular, and shoulder region to decrease pain and restrictions and increase pain free mobility. Scar release to surgical incison and associated tissue  Occupational Therapy Assessment and  Plan OT Assessment and Plan Clinical Impression Statement: A: Pt has demonstrated improvements in shoulder AROM over past month. It is also important to note that pt has not attended therapy for past week due to illness, and was consequently feeling weak this date. Pt's fascial restrictions have improved to min overall, but she remains with one tighter spot in anterior upper arm region. OT Plan: P: Continue PROM, AROM w 1 lb weights   Goals Short Term Goals Short Term Goal 1: Paatient will be edcuated on HEP  Short Term Goal 1 Progress: Progressing toward goal Short Term Goal 2 Progress: Progressing toward goal Short Term Goal 3: Patient will decrease right shoulder pain to 3/10 when completing functional daily  activities.  Short Term Goal 3 Progress: Met Short Term Goal 4: Patient will improve right shoulder strength to 3+/5 for increased ability to manipulate bra and improve functional mobility/safety Short Term Goal 4 Progress: Progressing toward goal Short Term Goal 5: Patient will decrease right shoulder fascial restrictions to min in her right shoulder region.  Short Term Goal 5 Progress: Met Long Term Goals Long Term Goal 1: Patient will return to prior level of independence with all B/IADLs and leisure activities. Long Term Goal 1 Progress: Progressing toward goal Long Term Goal 2: Patient will improve right shoulder strength to >/= 4/5 for increased ability to perform  Long Term Goal 2 Progress: Progressing toward goal Long Term Goal 3: Patient will improve RUE AROM to [facility] for increased ability to reach into overhead cabinets.  Long Term Goal 3 Progress: Progressing toward  goal Long Term Goal 4: Patient will decrease right shoulder pain to </=1/10 when completing functional activities.  Long Term Goal 4 Progress: Progressing toward goal Long Term Goal 5: Patient will decrease  fascial restrictions to trace in right shoulder region. Long Term Goal 5 Progress: Progressing toward  goal  Problem List Patient Active Problem List   Diagnosis Date Noted  . Poor balance 10/14/2013  . Repeated falls 10/14/2013  . Acute encephalopathy 07/30/2013  . Encephalopathy, hepatic 07/30/2013  . Confusion 07/29/2013  . Pancreatitis 04/26/2013  . Edema of left lower extremity 04/14/2013  . Liver cirrhosis secondary to NASH 04/14/2013  . Generalized weakness 02/19/2013  . Acute blood loss anemia 02/18/2013  . UTI (urinary tract infection) 02/14/2013  . Encephalopathy acute 02/14/2013  . Hyponatremia 02/14/2013  . Hypokalemia 02/14/2013  . Fracture of humeral shaft, right, closed 02/11/2013  . Multiple falls 01/26/2013  . Difficulty walking 01/26/2013  . Bilateral leg weakness 01/26/2013  . Personal history of fall 09/02/2012  . Dizziness 09/02/2012  . Frequent falls 08/08/2012  . Ataxia 08/08/2012  . Peripheral neuropathy 08/08/2012  . Preoperative clearance 06/06/2012  . 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 Anemia 04/08/2012  . Renal colic 40/98/1191  . Encephalopathy chronic 09/14/2011  . Posterior tibial tendon dysfunction 07/12/2011  . Hyperlipidemia 05/31/2011  . Essential hypertension 05/31/2011  . Uncontrolled type 2 diabetes mellitus with complication 47/82/9562  . GERD 09/29/2010  . Other chronic nonalcoholic liver disease 13/06/6577  . COLONIC POLYPS, HX OF 02/13/2010  . CIRRHOSIS 02/07/2010  . THROMBOCYTOPENIA, CHRONIC 01/04/2010  . PERIPHERAL NEUROPATHY 01/04/2010  . CAD, NATIVE VESSEL 01/04/2010  . ANXIETY DISORDER, HX OF 01/04/2010  . Intrinsic asthma, unspecified 03/16/2009  . ALLERGIC RHINITIS 02/18/2009  . DYSPNEA 02/18/2009  . DIABETES, TYPE 2 02/04/2009  . HYPERLIPIDEMIA 02/04/2009  . OBESITY 02/04/2009  . HYPERTENSION 02/04/2009    End of Session Activity Tolerance: Patient tolerated treatment well General Behavior During Therapy: [facility] for tasks assessed/performed  GO Functional Limitation: Carrying, moving  and handling objects Carrying, Moving and Handling Objects Current Status ([id number]): At least 40 percent but less than 60 percent impaired, limited or restricted Carrying, Moving and Handling Objects Goal Status [phone]): At least 1 percent but less than 20 percent impaired, limited or restricted  Bea Graff, [place], OTR/L [phone]  12/25/2013, 4:33 PM  Physician Documentation Your signature is required to indicate approval of the treatment plan as stated above.  Please sign and either send electronically or make a copy of this report for your files and return this physician signed original.  Please mark one 1.__approve of plan  2. ___approve of plan with the following conditions.   ______________________________                                                          _____________________ Physician Signature  Date  

## 2013-12-28 ENCOUNTER — Ambulatory Visit (HOSPITAL_COMMUNITY)
Admission: RE | Admit: 2013-12-28 | Discharge: 2013-12-28 | Disposition: A | Discharge status: Home / Self Care | Payer: Medicare Other | Source: Ambulatory Visit | Attending: Family Medicine | Admitting: Family Medicine

## 2013-12-28 DIAGNOSIS — IMO0001 Reserved for inherently not codable concepts without codable children: Secondary | ICD-10-CM | POA: Diagnosis not present

## 2013-12-28 NOTE — Progress Notes (Signed)
Physical Therapy Treatment Patient Details  Name: [name] [name] MRN: [id number] Date of Birth: 1940-12-24  Today's Date: 12/28/2013 Time: 6295-2841 PT Time Calculation (min): 43 min Charges: Therex x 30' NMR x 10'  Visit#: 23 of 34  Re-eval: 01/13/14  Authorization: Medicare  Authorization Time Period: Gcode complete 19th visit  Authorization Visit#: 23 of 29   Subjective: Symptoms/Limitations Symptoms: Pt states that she had a stomach virus last weak and she feels very weak. Pain Assessment Currently in Pain?: Yes Pain Score: 4  Pain Location: Shoulder Pain Orientation: Right  Exercise/Treatments Standing Scapular Retraction: 10 reps;Theraband Theraband Level (Scapular Retraction): Level 2 (Red) Row: 10 reps;Theraband Theraband Level (Row): Level 2 (Red) Shoulder Extension: 10 reps;Theraband Theraband Level (Shoulder Extension): Level 2 (Red) Supine Ab Set: 10 reps;5 seconds Bent Knee Raise: 10 reps Bridge: 10 reps;5 seconds Straight Leg Raise: 10 reps Sidelying Hip Abduction: 10 reps  Balance Exercises Standing Standing Eyes Opened: Narrow base of support (BOS);2 reps;30 secs;Limitations Standing Eyes Closed: Narrow base of support (BOS);2 reps;30 secs;Limitations Tandem Stance: 2 reps;30 secs;Hand held assist [facility]) 2;Eyes open   Physical Therapy Assessment and Plan PT Assessment and Plan Clinical Impression Statement: Progressed core strengthening exercises to improve core stabilization. Pt requires multimodal cueing to facilitates abdominal contraction. Also began scapular tband exercises to improve posture. Pt requires multimodal cueing to improve form with this activity. Mild choking noted when pt drank water. She states that this happens often with thin liquids. Pt plans to see GI this week. Recommend that pt bring this issue up at that appointment.  PT Plan: Continue to monitor O2 sats.  Progress core strengthening exercises to improve posture with gait.        Problem List Patient Active Problem List   Diagnosis Date Noted  . Poor balance 10/14/2013  . Repeated falls 10/14/2013  . Acute encephalopathy 07/30/2013  . Encephalopathy, hepatic 07/30/2013  . Confusion 07/29/2013  . Pancreatitis 04/26/2013  . Edema of left lower extremity 04/14/2013  . Liver cirrhosis secondary to NASH 04/14/2013  . Generalized weakness 02/19/2013  . Acute blood loss anemia 02/18/2013  . UTI (urinary tract infection) 02/14/2013  . Encephalopathy acute 02/14/2013  . Hyponatremia 02/14/2013  . Hypokalemia 02/14/2013  . Fracture of humeral shaft, right, closed 02/11/2013  . Multiple falls 01/26/2013  . Difficulty walking 01/26/2013  . Bilateral leg weakness 01/26/2013  . Personal history of fall 09/02/2012  . Dizziness 09/02/2012  . Frequent falls 08/08/2012  . Ataxia 08/08/2012  . Peripheral neuropathy 08/08/2012  . Preoperative clearance 06/06/2012  . 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 Anemia 04/08/2012  . Renal colic 32/44/0102  . Encephalopathy chronic 09/14/2011  . Posterior tibial tendon dysfunction 07/12/2011  . Hyperlipidemia 05/31/2011  . Essential hypertension 05/31/2011  . Uncontrolled type 2 diabetes mellitus with complication 72/53/6644  . GERD 09/29/2010  . Other chronic nonalcoholic liver disease 03/47/4259  . COLONIC POLYPS, HX OF 02/13/2010  . CIRRHOSIS 02/07/2010  . THROMBOCYTOPENIA, CHRONIC 01/04/2010  . PERIPHERAL NEUROPATHY 01/04/2010  . CAD, NATIVE VESSEL 01/04/2010  . ANXIETY DISORDER, HX OF 01/04/2010  . Intrinsic asthma, unspecified 03/16/2009  . ALLERGIC RHINITIS 02/18/2009  . DYSPNEA 02/18/2009  . DIABETES, TYPE 2 02/04/2009  . HYPERLIPIDEMIA 02/04/2009  . OBESITY 02/04/2009  . HYPERTENSION 02/04/2009    PT - End of Session Equipment Utilized During Treatment: Gait belt Activity Tolerance: Patient limited by fatigue;Patient tolerated treatment well General Behavior During Therapy: [facility]  for tasks assessed/performed  Rachelle Hora, PTA 12/28/2013, 2:58 PM

## 2013-12-30 ENCOUNTER — Ambulatory Visit (HOSPITAL_COMMUNITY): Payer: Medicare Other

## 2014-01-01 ENCOUNTER — Ambulatory Visit (HOSPITAL_COMMUNITY)
Admission: RE | Admit: 2014-01-01 | Discharge: 2014-01-01 | Disposition: A | Discharge status: Home / Self Care | Payer: Medicare Other | Source: Ambulatory Visit | Attending: Family Medicine | Admitting: Family Medicine

## 2014-01-01 DIAGNOSIS — IMO0001 Reserved for inherently not codable concepts without codable children: Secondary | ICD-10-CM | POA: Diagnosis not present

## 2014-01-01 NOTE — Progress Notes (Signed)
Occupational Therapy Treatment Patient Details  Name: [name] [name] MRN: [id number] Date of Birth: 1940-12-03  Today's Date: 01/01/2014 Time: 3094-0768 OT Time Calculation (min): 41 min Manual 1305-1320 (15') Therapeutic Exercises 1320-1346 (88')  Visit#: 9 of 16  Re-eval: 01/22/14    Authorization: [employer] Medicare  Authorization Time Period: before 17th visit  Authorization Visit#: 9 of 17  Subjective Symptoms/Limitations Symptoms: "I'm feeling a whole lot better than Monday." Pain Assessment Pain Score: 1  Pain Location: Shoulder Pain Orientation: Right Pain Type: Acute pain  Exercise/Treatments Supine Protraction: AROM;12 reps;Weights Protraction Weight (lbs): 1 Horizontal ABduction: AROM;12 reps;Weights Horizontal ABduction Weight (lbs): 1 External Rotation: AROM;12 reps;Weights External Rotation Weight (lbs): 1 Internal Rotation: AROM;12 reps;Weights Internal Rotation Weight (lbs): 1 Flexion: PROM;5 reps;AROM;12 reps;Weights Shoulder Flexion Weight (lbs): 1 ABduction: PROM;5 reps;AROM;12 reps;Weights (wtih faciliation for beyond 90 degrees) Shoulder ABduction Weight (lbs): 1 Standing Other Standing Exercises: wall walk with retraction and 5 second hold x3 before becoming fatigued Other Seated Exercises: functional reaching - open cabinet door. place and remove 6 cones from top shelf in cabinet. verbal cueing for deliberate movements.   ROM / Strengthening / Isometric Strengthening Proximal Shoulder Strengthening, Supine: 30 reps each up/down and criss cross, with therapist facililition to limit ROM      Manual Therapy Manual Therapy: Myofascial release Myofascial Release: MFR and manual stretching to right upper arm, scapular, and shoulder region to decrease pain and restrictions and increase pain free mobility. Scar release to surgical incison and associated tissue  Occupational Therapy Assessment and Plan OT Assessment and Plan Clinical  Impression Statement: A: Pt had some increased tightness with AROM at beginning of session, but progressed well with repititions. Became fatigued during proximal shoulder exercises. Tolerated well functional reaching into top cabinet x6 reps - pt has increased tolerance of shoulder flexions during functional reaching activity over wall walk. OT Plan: P: Continue PROM, AROM w 1 lb weights. Functional reaching in flexion and abduction.   Goals Short Term Goals Short Term Goal 1: Paatient will be edcuated on HEP  Short Term Goal 1 Progress: Progressing toward goal Short Term Goal 2: Patient will improve PROM in right shoulder and elbow to [facility] for increased ability to fix her hair.  Short Term Goal 2 Progress: Progressing toward goal Short Term Goal 3: Patient will decrease right shoulder pain to 3/10 when completing functional daily  activities.  Short Term Goal 3 Progress: Met Short Term Goal 4: Patient will improve right shoulder strength to 3+/5 for increased ability to manipulate bra and improve functional mobility/safety Short Term Goal 4 Progress: Progressing toward goal Short Term Goal 5: Patient will decrease right shoulder fascial restrictions to min in her right shoulder region.  Short Term Goal 5 Progress: Met Long Term Goals Long Term Goal 1: Patient will return to prior level of independence with all B/IADLs and leisure activities. Long Term Goal 1 Progress: Progressing toward goal Long Term Goal 2: Patient will improve right shoulder strength to >/= 4/5 for increased ability to perform  Long Term Goal 2 Progress: Progressing toward goal Long Term Goal 3: Patient will improve RUE AROM to [facility] for increased ability to reach into overhead cabinets.  Long Term Goal 3 Progress: Progressing toward goal Long Term Goal 4: Patient will decrease right shoulder pain to </=1/10 when completing functional activities.  Long Term Goal 4 Progress: Progressing toward goal Long Term Goal 5: Patient  will decrease  fascial restrictions to trace in right shoulder region.  Long Term Goal 5 Progress: Progressing toward goal  Problem List Patient Active Problem List   Diagnosis Date Noted  . Poor balance 10/14/2013  . Repeated falls 10/14/2013  . Acute encephalopathy 07/30/2013  . Encephalopathy, hepatic 07/30/2013  . Confusion 07/29/2013  . Pancreatitis 04/26/2013  . Edema of left lower extremity 04/14/2013  . Liver cirrhosis secondary to NASH 04/14/2013  . Generalized weakness 02/19/2013  . Acute blood loss anemia 02/18/2013  . UTI (urinary tract infection) 02/14/2013  . Encephalopathy acute 02/14/2013  . Hyponatremia 02/14/2013  . Hypokalemia 02/14/2013  . Fracture of humeral shaft, right, closed 02/11/2013  . Multiple falls 01/26/2013  . Difficulty walking 01/26/2013  . Bilateral leg weakness 01/26/2013  . Personal history of fall 09/02/2012  . Dizziness 09/02/2012  . Frequent falls 08/08/2012  . Ataxia 08/08/2012  . Peripheral neuropathy 08/08/2012  . Preoperative clearance 06/06/2012  . 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 Anemia 04/08/2012  . Renal colic 33/83/2919  . Encephalopathy chronic 09/14/2011  . Posterior tibial tendon dysfunction 07/12/2011  . Hyperlipidemia 05/31/2011  . Essential hypertension 05/31/2011  . Uncontrolled type 2 diabetes mellitus with complication 16/60/6004  . GERD 09/29/2010  . Other chronic nonalcoholic liver disease 59/97/7414  . COLONIC POLYPS, HX OF 02/13/2010  . CIRRHOSIS 02/07/2010  . THROMBOCYTOPENIA, CHRONIC 01/04/2010  . PERIPHERAL NEUROPATHY 01/04/2010  . CAD, NATIVE VESSEL 01/04/2010  . ANXIETY DISORDER, HX OF 01/04/2010  . Intrinsic asthma, unspecified 03/16/2009  . ALLERGIC RHINITIS 02/18/2009  . DYSPNEA 02/18/2009  . DIABETES, TYPE 2 02/04/2009  . HYPERLIPIDEMIA 02/04/2009  . OBESITY 02/04/2009  . HYPERTENSION 02/04/2009    End of Session Activity Tolerance: Patient tolerated treatment  well General Behavior During Therapy: [facility] for tasks assessed/performed  GO    Bea Graff, MS, OTR/L [phone]  01/01/2014, 3:11 PM

## 2014-01-04 ENCOUNTER — Ambulatory Visit (HOSPITAL_COMMUNITY)
Admission: RE | Admit: 2014-01-04 | Discharge: 2014-01-04 | Disposition: A | Discharge status: Home / Self Care | Payer: Medicare Other | Source: Ambulatory Visit | Attending: Family Medicine | Admitting: Family Medicine

## 2014-01-04 DIAGNOSIS — IMO0001 Reserved for inherently not codable concepts without codable children: Secondary | ICD-10-CM | POA: Diagnosis not present

## 2014-01-04 NOTE — Progress Notes (Signed)
Occupational Therapy Treatment Patient Details  Name: [name] [name] MRN: [id number] Date of Birth: [birth year]  Today's Date: 01/04/2014 Time: 8413-2440 OT Time Calculation (min): 36 min Manual 1352-1405 (13') Therapeutic Exercises 1027-2536 (11')  Visit#: 10 of 16  Re-eval: 01/22/14    Authorization: [employer] Medicare  Authorization Time Period: before 17th visit  Authorization Visit#: 10 of 17  Subjective Symptoms/Limitations Symptoms: "Just a little soreness in this shoulder." Pain Assessment Currently in Pain?: No/denies   Exercise/Treatments Supine Protraction: AROM;12 reps;Weights Protraction Weight (lbs): 1 Horizontal ABduction: AROM;12 reps;Weights Horizontal ABduction Weight (lbs): 1 External Rotation: AROM;12 reps;Weights External Rotation Weight (lbs): 1 Internal Rotation: AROM;12 reps;Weights Internal Rotation Weight (lbs): 1 Flexion: AROM;12 reps;Weights Shoulder Flexion Weight (lbs): 1 ABduction: AROM;12 reps;Weights (with facilitation beyond 90 degrees) Shoulder ABduction Weight (lbs): 1 Seated Other Seated Exercises: functional reaching - open cabinet door. place and remove 11 cones from top shelf in cabinet. reaching abduction to retrieve cones from therapist and return them. verbal cueing for deliberate movements.   ROM / Strengthening / Isometric Strengthening Proximal Shoulder Strengthening, Supine: 15 reps each up/down and criss cross, with therapist facililition to limit ROM x2 reps Ball on Wall: 1 min with green ball with 3 drops and cues to keep eyes on ball      Manual Therapy Manual Therapy: Myofascial release Myofascial Release: MFR and manual stretching to right upper arm, scapular, and shoulder region to decrease pain and restrictions and increase pain free mobility. Scar release to surgical incison and associated tissue.    Occupational Therapy Assessment and Plan OT Assessment and Plan Clinical Impression Statement: A: Pt  had good success with functional reaching in flexion and abduction - required cueing for creating deliberate movements rather than using momentum and maintaining positioning to focus on pure flexion and abduction. OT Plan: P: Continue PROM (especially abduction),  AROM with 1 lb weights. Proximal shoulder strengthening.   Goals Short Term Goals Short Term Goal 1: Paatient will be edcuated on HEP  Short Term Goal 1 Progress: Progressing toward goal Short Term Goal 2: Patient will improve PROM in right shoulder and elbow to [facility] for increased ability to fix her hair.  Short Term Goal 2 Progress: Progressing toward goal Short Term Goal 3: Patient will decrease right shoulder pain to 3/10 when completing functional daily  activities.  Short Term Goal 3 Progress: Met Short Term Goal 4: Patient will improve right shoulder strength to 3+/5 for increased ability to manipulate bra and improve functional mobility/safety Short Term Goal 4 Progress: Progressing toward goal Short Term Goal 5: Patient will decrease right shoulder fascial restrictions to min in her right shoulder region.  Short Term Goal 5 Progress: Met Long Term Goals Long Term Goal 1: Patient will return to prior level of independence with all B/IADLs and leisure activities. Long Term Goal 1 Progress: Progressing toward goal Long Term Goal 2: Patient will improve right shoulder strength to >/= 4/5 for increased ability to perform  Long Term Goal 2 Progress: Progressing toward goal Long Term Goal 3: Patient will improve RUE AROM to [facility] for increased ability to reach into overhead cabinets.  Long Term Goal 3 Progress: Progressing toward goal Long Term Goal 4: Patient will decrease right shoulder pain to </=1/10 when completing functional activities.  Long Term Goal 4 Progress: Progressing toward goal Long Term Goal 5: Patient will decrease  fascial restrictions to trace in right shoulder region. Long Term Goal 5 Progress: Progressing toward  goal  Problem  List Patient Active Problem List   Diagnosis Date Noted  . Poor balance 10/14/2013  . Repeated falls 10/14/2013  . Acute encephalopathy 07/30/2013  . Encephalopathy, hepatic 07/30/2013  . Confusion 07/29/2013  . Pancreatitis 04/26/2013  . Edema of left lower extremity 04/14/2013  . Liver cirrhosis secondary to NASH 04/14/2013  . Generalized weakness 02/19/2013  . Acute blood loss anemia 02/18/2013  . UTI (urinary tract infection) 02/14/2013  . Encephalopathy acute 02/14/2013  . Hyponatremia 02/14/2013  . Hypokalemia 02/14/2013  . Fracture of humeral shaft, right, closed 02/11/2013  . Multiple falls 01/26/2013  . Difficulty walking 01/26/2013  . Bilateral leg weakness 01/26/2013  . Personal history of fall 09/02/2012  . Dizziness 09/02/2012  . Frequent falls 08/08/2012  . Ataxia 08/08/2012  . Peripheral neuropathy 08/08/2012  . Preoperative clearance 06/06/2012  . 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 Anemia 04/08/2012  . Renal colic 99/41/2904  . Encephalopathy chronic 09/14/2011  . Posterior tibial tendon dysfunction 07/12/2011  . Hyperlipidemia 05/31/2011  . Essential hypertension 05/31/2011  . Uncontrolled type 2 diabetes mellitus with complication 75/33/9179  . GERD 09/29/2010  . Other chronic nonalcoholic liver disease 21/78/3754  . COLONIC POLYPS, HX OF 02/13/2010  . CIRRHOSIS 02/07/2010  . THROMBOCYTOPENIA, CHRONIC 01/04/2010  . PERIPHERAL NEUROPATHY 01/04/2010  . CAD, NATIVE VESSEL 01/04/2010  . ANXIETY DISORDER, HX OF 01/04/2010  . Intrinsic asthma, unspecified 03/16/2009  . ALLERGIC RHINITIS 02/18/2009  . DYSPNEA 02/18/2009  . DIABETES, TYPE 2 02/04/2009  . HYPERLIPIDEMIA 02/04/2009  . OBESITY 02/04/2009  . HYPERTENSION 02/04/2009    End of Session Activity Tolerance: Patient tolerated treatment well General Behavior During Therapy: [facility] for tasks assessed/performed  GO    Bea Graff, MS, OTR/L [phone]  01/04/2014, 4:53 PM

## 2014-01-04 NOTE — Progress Notes (Signed)
Physical Therapy Treatment Patient Details  Name: [name] [name] MRN: [id number] Date of Birth: June 16, 1941  Today's Date: 01/04/2014 Time: 4098-1191 PT Time Calculation (min): 43 min Charges: Therex x [phone]) NMR x 08'([id number])  Visit#: 24 of 34  Re-eval: 01/13/14  Authorization: Medicare  Authorization Time Period: Gcode complete 19th visit  Authorization Visit#: 24 of 29   Subjective: Symptoms/Limitations Symptoms: Pt states that she has been working more on her shoulder exercises at home than anything else. Pain Assessment Currently in Pain?: No/denies  Precautions/Restrictions     Exercise/Treatments Standing Row: 10 reps;Theraband Theraband Level (Row): Level 2 (Red) Shoulder Extension: 10 reps;Theraband Theraband Level (Shoulder Extension): Level 2 (Red) Supine Ab Set: 10 reps;5 seconds Bridge: 15 reps Straight Leg Raise: 10 reps Sidelying Hip Abduction: 10 reps Prone  Straight Leg Raise: 10 reps Balance Exercises Standing Standing Eyes Opened: 5 reps;20 secs;Narrow base of support (BOS) Cone Rotation: A/P;Solid surface (1 RT)   Physical Therapy Assessment and Plan PT Assessment and Plan Clinical Impression Statement: Pt completes therex with improve form and stability. Pt requires multimodal cueing to improve form with standing scapular tband exercises. Pt requires verbal cueing to improve focus with NMR activities. Pt is without complaint throughout session. PT Plan: Continue to monitor O2 sats.  Progress core strengthening exercises to improve posture with gait. Begin functional squats next session.    Problem List Patient Active Problem List   Diagnosis Date Noted  . Poor balance 10/14/2013  . Repeated falls 10/14/2013  . Acute encephalopathy 07/30/2013  . Encephalopathy, hepatic 07/30/2013  . Confusion 07/29/2013  . Pancreatitis 04/26/2013  . Edema of left lower extremity 04/14/2013  . Liver cirrhosis secondary to NASH 04/14/2013  .  Generalized weakness 02/19/2013  . Acute blood loss anemia 02/18/2013  . UTI (urinary tract infection) 02/14/2013  . Encephalopathy acute 02/14/2013  . Hyponatremia 02/14/2013  . Hypokalemia 02/14/2013  . Fracture of humeral shaft, right, closed 02/11/2013  . Multiple falls 01/26/2013  . Difficulty walking 01/26/2013  . Bilateral leg weakness 01/26/2013  . Personal history of fall 09/02/2012  . Dizziness 09/02/2012  . Frequent falls 08/08/2012  . Ataxia 08/08/2012  . Peripheral neuropathy 08/08/2012  . Preoperative clearance 06/06/2012  . 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 Anemia 04/08/2012  . Renal colic 29/52/8413  . Encephalopathy chronic 09/14/2011  . Posterior tibial tendon dysfunction 07/12/2011  . Hyperlipidemia 05/31/2011  . Essential hypertension 05/31/2011  . Uncontrolled type 2 diabetes mellitus with complication 24/40/1027  . GERD 09/29/2010  . Other chronic nonalcoholic liver disease 25/36/6440  . COLONIC POLYPS, HX OF 02/13/2010  . CIRRHOSIS 02/07/2010  . THROMBOCYTOPENIA, CHRONIC 01/04/2010  . PERIPHERAL NEUROPATHY 01/04/2010  . CAD, NATIVE VESSEL 01/04/2010  . ANXIETY DISORDER, HX OF 01/04/2010  . Intrinsic asthma, unspecified 03/16/2009  . ALLERGIC RHINITIS 02/18/2009  . DYSPNEA 02/18/2009  . DIABETES, TYPE 2 02/04/2009  . HYPERLIPIDEMIA 02/04/2009  . OBESITY 02/04/2009  . HYPERTENSION 02/04/2009    PT - End of Session Equipment Utilized During Treatment: Gait belt Activity Tolerance: Patient limited by fatigue;Patient tolerated treatment well General Behavior During Therapy: [facility] for tasks assessed/performed  Rachelle Hora, PTA  01/04/2014, 2:28 PM

## 2014-01-06 ENCOUNTER — Ambulatory Visit (HOSPITAL_COMMUNITY): Payer: Medicare Other

## 2014-01-07 ENCOUNTER — Ambulatory Visit: Payer: Medicare Other | Admitting: Gastroenterology

## 2014-01-08 ENCOUNTER — Ambulatory Visit (HOSPITAL_COMMUNITY)
Admission: RE | Admit: 2014-01-08 | Discharge: 2014-01-08 | Disposition: A | Discharge status: Home / Self Care | Payer: Medicare Other | Source: Ambulatory Visit | Attending: Family Medicine | Admitting: Family Medicine

## 2014-01-08 DIAGNOSIS — R296 Repeated falls: Secondary | ICD-10-CM

## 2014-01-08 DIAGNOSIS — R2689 Other abnormalities of gait and mobility: Secondary | ICD-10-CM

## 2014-01-08 DIAGNOSIS — IMO0001 Reserved for inherently not codable concepts without codable children: Secondary | ICD-10-CM | POA: Diagnosis not present

## 2014-01-08 NOTE — Progress Notes (Signed)
Occupational Therapy Treatment Patient Details  Name: [name] [name] MRN: [id number] Date of Birth: [birth year]  Today's Date: 01/08/2014 Time: 1540-0867 OT Time Calculation (min): 41 min Manual 1352-1402 (20') Therapeutic Exercises 1402-1433 (31')  Visit#: 11 of 16  Re-eval: 01/22/14    Authorization: [employer] Medicare  Authorization Time Period: before 17th visit  Authorization Visit#: 11 of 17  Subjective Symptoms/Limitations Symptoms: "Just in my shoulder - its been bad all day, for 2 or 3 days. I just feel terrible today." Pain Assessment Pain Score: 7  Pain Location: Shoulder Pain Orientation: Right Pain Type: Acute pain   Exercise/Treatments Supine Protraction: PROM;10 reps;AROM;15 reps Protraction Weight (lbs): 1 Horizontal ABduction: PROM;10 reps;AROM;12 reps Horizontal ABduction Weight (lbs): 1 External Rotation: PROM;10 reps;AROM;15 reps External Rotation Weight (lbs): 1 Internal Rotation: PROM;10 reps;AROM;15 reps Internal Rotation Weight (lbs): 1 Flexion: PROM;10 reps;AROM;15 reps Shoulder Flexion Weight (lbs): 1 ABduction: PROM;10 reps;AROM;15 reps Shoulder ABduction Weight (lbs): 1 ABduction Limitations: to 90 degrees Seated Elevation: AROM;15 reps Extension: AROM;15 reps Row: AROM;15 reps Flexion: AAROM;15 reps ROM / Strengthening / Isometric Strengthening Proximal Shoulder Strengthening, Supine: 15 reps each up/down and criss cross, with therapist facililition to limit ROM x2 reps (Pt advised to use ceiling tiles/light fixture to moniter ROM)   Manual Therapy Manual Therapy: Myofascial release Myofascial Release: MFR and manual stretching to right upper arm, scapular, and shoulder region to decrease pain and restrictions and increase pain free mobility.   Occupational Therapy Assessment and Plan OT Assessment and Plan Clinical Impression Statement: A: Pt had 1 instance of loss of balance while ambulating towards therapy mat - pt  recovered with assist from OTR/L and continue ambulating with RW to mat with minA from each OTR/L and PT. Pt indicated being dizzy, but PT in previous session reported no abnormal bloodpressure or O2 sats after measurement during session. Pt reported less dizziness with lying on therapy mat. Pt had decreased pain with MFR and exercises. Pt tolerated well  AROM,  but required increased cueing this session and presented with a slightly flat affect. OT Plan: P: PROM, AROM, proximal shoulder strengthening   Goals Short Term Goals Short Term Goal 1: Paatient will be edcuated on HEP  Short Term Goal 1 Progress: Progressing toward goal Short Term Goal 2: Patient will improve PROM in right shoulder and elbow to [facility] for increased ability to fix her hair.  Short Term Goal 2 Progress: Progressing toward goal Short Term Goal 3: Patient will decrease right shoulder pain to 3/10 when completing functional daily  activities.  Short Term Goal 3 Progress: Met Short Term Goal 4: Patient will improve right shoulder strength to 3+/5 for increased ability to manipulate bra and improve functional mobility/safety Short Term Goal 4 Progress: Progressing toward goal Short Term Goal 5: Patient will decrease right shoulder fascial restrictions to min in her right shoulder region.  Short Term Goal 5 Progress: Met Long Term Goals Long Term Goal 1: Patient will return to prior level of independence with all B/IADLs and leisure activities. Long Term Goal 1 Progress: Progressing toward goal Long Term Goal 2: Patient will improve right shoulder strength to >/= 4/5 for increased ability to perform  Long Term Goal 2 Progress: Progressing toward goal Long Term Goal 3: Patient will improve RUE AROM to [facility] for increased ability to reach into overhead cabinets.  Long Term Goal 3 Progress: Progressing toward goal Long Term Goal 4: Patient will decrease right shoulder pain to </=1/10 when completing functional activities.  Long Term  Goal 4 Progress: Progressing toward goal Long Term Goal 5: Patient will decrease  fascial restrictions to trace in right shoulder region. Long Term Goal 5 Progress: Progressing toward goal  Problem List Patient Active Problem List   Diagnosis Date Noted  . Poor balance 10/14/2013  . Repeated falls 10/14/2013  . Acute encephalopathy 07/30/2013  . Encephalopathy, hepatic 07/30/2013  . Confusion 07/29/2013  . Pancreatitis 04/26/2013  . Edema of left lower extremity 04/14/2013  . Liver cirrhosis secondary to NASH 04/14/2013  . Generalized weakness 02/19/2013  . Acute blood loss anemia 02/18/2013  . UTI (urinary tract infection) 02/14/2013  . Encephalopathy acute 02/14/2013  . Hyponatremia 02/14/2013  . Hypokalemia 02/14/2013  . Fracture of humeral shaft, right, closed 02/11/2013  . Multiple falls 01/26/2013  . Difficulty walking 01/26/2013  . Bilateral leg weakness 01/26/2013  . Personal history of fall 09/02/2012  . Dizziness 09/02/2012  . Frequent falls 08/08/2012  . Ataxia 08/08/2012  . Peripheral neuropathy 08/08/2012  . Preoperative clearance 06/06/2012  . 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 Anemia 04/08/2012  . Renal colic 80/32/1224  . Encephalopathy chronic 09/14/2011  . Posterior tibial tendon dysfunction 07/12/2011  . Hyperlipidemia 05/31/2011  . Essential hypertension 05/31/2011  . Uncontrolled type 2 diabetes mellitus with complication 82/50/0370  . GERD 09/29/2010  . Other chronic nonalcoholic liver disease 48/88/9169  . COLONIC POLYPS, HX OF 02/13/2010  . CIRRHOSIS 02/07/2010  . THROMBOCYTOPENIA, CHRONIC 01/04/2010  . PERIPHERAL NEUROPATHY 01/04/2010  . CAD, NATIVE VESSEL 01/04/2010  . ANXIETY DISORDER, HX OF 01/04/2010  . Intrinsic asthma, unspecified 03/16/2009  . ALLERGIC RHINITIS 02/18/2009  . DYSPNEA 02/18/2009  . DIABETES, TYPE 2 02/04/2009  . HYPERLIPIDEMIA 02/04/2009  . OBESITY 02/04/2009  . HYPERTENSION 02/04/2009     End of Session Activity Tolerance: Patient tolerated treatment well General Behavior During Therapy: [facility] for tasks assessed/performed  GO    Bea Graff, MS, OTR/L [phone]  01/08/2014, 2:54 PM

## 2014-01-08 NOTE — Progress Notes (Signed)
Physical Therapy Treatment Patient Details  Name: [name] [name] MRN: [id number] Date of Birth: 1941-04-24  Today's Date: 01/08/2014 Time: 8144-8185 PT Time Calculation (min): 41 min  Visit#: 25 of 34  Re-eval: 01/13/14    Authorization: Medicare  Authorization Time Period: Dareen Piano complete 19th visit  Authorization Visit#: 25 of 29   Subjective: Symptoms/Limitations Symptoms: Pt states she is not feeling well states she has been SOB for a couple of days.  Pt states that she does her mat exercises at home twice a day. Pain Assessment Pain Score: 7  Pain Location: Shoulder Pain Orientation: Right Pain Type: Acute pain  Precautions/Restrictions     Exercise/Treatments Mobility/Balance        Balance Exercises Standing Standing Eyes Opened: Narrow base of support (BOS);2 reps Sidestepping: 2 reps Marching: 5 reps Heel Raises: 10 reps Minisquats:  10 reps Other Standing Exercises: weight shift side to side; foot in front of the other weight shift forward/ back; tall kneel walking as well as weight shifting and tall kneel side stepping. Other Standing Exercises:  standing keeping balance with B UE flexion.      Physical Therapy Assessment and Plan PT Assessment and Plan Clinical Impression Statement: Pt vitals were taken due to pt complaint about being SOB pt O2 was at 96 with HR 82; BP 130/78.  Pt O2 taken throughout treatment and the lowest it dropped to was 93%.  Therapist spoke to pt about why we want pt to breath via her diaphragm vs. Mouth breathing.  Pt needed multiple rest breaks.   PT Plan: Continue to monitor O2 sats.  Concentrate on core exercises while upright to link to ambulation. Pt is doing mat exercises at home progress pt to more difficult tasks that need spotting.    Goals  limited due to pt poor tolerance to activity.  Problem List Patient Active Problem List   Diagnosis Date Noted  . Poor balance 10/14/2013  . Repeated falls 10/14/2013  . Acute  encephalopathy 07/30/2013  . Encephalopathy, hepatic 07/30/2013  . Confusion 07/29/2013  . Pancreatitis 04/26/2013  . Edema of left lower extremity 04/14/2013  . Liver cirrhosis secondary to NASH 04/14/2013  . Generalized weakness 02/19/2013  . Acute blood loss anemia 02/18/2013  . UTI (urinary tract infection) 02/14/2013  . Encephalopathy acute 02/14/2013  . Hyponatremia 02/14/2013  . Hypokalemia 02/14/2013  . Fracture of humeral shaft, right, closed 02/11/2013  . Multiple falls 01/26/2013  . Difficulty walking 01/26/2013  . Bilateral leg weakness 01/26/2013  . Personal history of fall 09/02/2012  . Dizziness 09/02/2012  . Frequent falls 08/08/2012  . Ataxia 08/08/2012  . Peripheral neuropathy 08/08/2012  . Preoperative clearance 06/06/2012  . 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 Anemia 04/08/2012  . Renal colic 63/14/9702  . Encephalopathy chronic 09/14/2011  . Posterior tibial tendon dysfunction 07/12/2011  . Hyperlipidemia 05/31/2011  . Essential hypertension 05/31/2011  . Uncontrolled type 2 diabetes mellitus with complication 63/78/5885  . GERD 09/29/2010  . Other chronic nonalcoholic liver disease 02/77/4128  . COLONIC POLYPS, HX OF 02/13/2010  . CIRRHOSIS 02/07/2010  . THROMBOCYTOPENIA, CHRONIC 01/04/2010  . PERIPHERAL NEUROPATHY 01/04/2010  . CAD, NATIVE VESSEL 01/04/2010  . ANXIETY DISORDER, HX OF 01/04/2010  . Intrinsic asthma, unspecified 03/16/2009  . ALLERGIC RHINITIS 02/18/2009  . DYSPNEA 02/18/2009  . DIABETES, TYPE 2 02/04/2009  . HYPERLIPIDEMIA 02/04/2009  . OBESITY 02/04/2009  . HYPERTENSION 02/04/2009    General Behavior During Therapy: [facility] for tasks assessed/performed  GP    RUSSELL,CINDY 01/08/2014, 4:11 PM

## 2014-01-11 ENCOUNTER — Ambulatory Visit (HOSPITAL_COMMUNITY)
Admission: RE | Admit: 2014-01-11 | Discharge: 2014-01-11 | Disposition: A | Discharge status: Home / Self Care | Payer: Medicare Other | Source: Ambulatory Visit | Attending: Family Medicine | Admitting: Family Medicine

## 2014-01-11 DIAGNOSIS — IMO0001 Reserved for inherently not codable concepts without codable children: Secondary | ICD-10-CM | POA: Insufficient documentation

## 2014-01-11 DIAGNOSIS — I10 Essential (primary) hypertension: Secondary | ICD-10-CM | POA: Diagnosis not present

## 2014-01-11 DIAGNOSIS — Z9181 History of falling: Secondary | ICD-10-CM | POA: Insufficient documentation

## 2014-01-11 DIAGNOSIS — R269 Unspecified abnormalities of gait and mobility: Secondary | ICD-10-CM | POA: Diagnosis not present

## 2014-01-11 DIAGNOSIS — E119 Type 2 diabetes mellitus without complications: Secondary | ICD-10-CM | POA: Insufficient documentation

## 2014-01-11 NOTE — Progress Notes (Signed)
Physical Therapy Treatment Patient Details  Name: [name] [name] MRN: [id number] Date of Birth: 12/09/1940  Today's Date: 01/11/2014 Time: 1105-1200 PT Time Calculation (min): 55 min Visit#: 25 of 34  Re-eval: 01/13/14 Authorization: Medicare  Authorization Time Period: Dareen Piano complete 19th visit  Authorization Visit#: 25 of 29  Charges:  NMR 1105-1148 (43')  Subjective: Symptoms/Limitations Symptoms: Pt states she did not leave her house over the weekend due to inclement weather (snow/ice).   Pain Assessment Currently in Pain?: No/denies  Precautions/Restrictions     Exercise/Treatments Balance Exercises Standing Wall Bumps: 10 reps Tandem Gait: 2 reps Retro Gait: 2 reps Sidestepping: 2 reps Step Over Hurdles / Cones: 2-3", 2-6" 3RT alteranating LE's Marching: Limitations Marching Limitations: 2RT high march walking Other Standing Exercises: Standing keeping balance with B UE flexion and abduction 2 minutes.   Seated Other Seated Exercises: nustep 10' level 3, hills #3 at end of session LE"s only for strengthening and activity tolerance.    Physical Therapy Assessment and Plan PT Assessment and Plan Clinical Impression Statement: PT with improving activity tolerance, however noted dyspnea and need for short rest break after 4 minutes on nustep.  O2 sats never fell below 92% during activity, however required constant vc's for breathing.  Added hurdles and worked on establishing core stabilty before attempting to move.  Pt tends to drag LT LE> Rt LE with hurdles and ambulation activities.  PT Plan: No need to monitor 02 sats as never lower than 92% with activity.  Re-evaluate next visit.     Problem List Patient Active Problem List   Diagnosis Date Noted  . Poor balance 10/14/2013  . Repeated falls 10/14/2013  . Acute encephalopathy 07/30/2013  . Encephalopathy, hepatic 07/30/2013  . Confusion 07/29/2013  . Pancreatitis 04/26/2013  . Edema of left lower extremity  04/14/2013  . Liver cirrhosis secondary to NASH 04/14/2013  . Generalized weakness 02/19/2013  . Acute blood loss anemia 02/18/2013  . UTI (urinary tract infection) 02/14/2013  . Encephalopathy acute 02/14/2013  . Hyponatremia 02/14/2013  . Hypokalemia 02/14/2013  . Fracture of humeral shaft, right, closed 02/11/2013  . Multiple falls 01/26/2013  . Difficulty walking 01/26/2013  . Bilateral leg weakness 01/26/2013  . Personal history of fall 09/02/2012  . Dizziness 09/02/2012  . Frequent falls 08/08/2012  . Ataxia 08/08/2012  . Peripheral neuropathy 08/08/2012  . Preoperative clearance 06/06/2012  . 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 Anemia 04/08/2012  . Renal colic 79/12/4095  . Encephalopathy chronic 09/14/2011  . Posterior tibial tendon dysfunction 07/12/2011  . Hyperlipidemia 05/31/2011  . Essential hypertension 05/31/2011  . Uncontrolled type 2 diabetes mellitus with complication 35/32/9924  . GERD 09/29/2010  . Other chronic nonalcoholic liver disease 26/83/4196  . COLONIC POLYPS, HX OF 02/13/2010  . CIRRHOSIS 02/07/2010  . THROMBOCYTOPENIA, CHRONIC 01/04/2010  . PERIPHERAL NEUROPATHY 01/04/2010  . CAD, NATIVE VESSEL 01/04/2010  . ANXIETY DISORDER, HX OF 01/04/2010  . Intrinsic asthma, unspecified 03/16/2009  . ALLERGIC RHINITIS 02/18/2009  . DYSPNEA 02/18/2009  . DIABETES, TYPE 2 02/04/2009  . HYPERLIPIDEMIA 02/04/2009  . OBESITY 02/04/2009  . HYPERTENSION 02/04/2009          Teena Irani, PTA/CLT 01/11/2014, 12:37 PM

## 2014-01-13 ENCOUNTER — Ambulatory Visit (HOSPITAL_COMMUNITY)
Admission: RE | Admit: 2014-01-13 | Discharge: 2014-01-13 | Disposition: A | Discharge status: Home / Self Care | Payer: Medicare Other | Source: Ambulatory Visit | Attending: Family Medicine | Admitting: Family Medicine

## 2014-01-13 DIAGNOSIS — R296 Repeated falls: Secondary | ICD-10-CM

## 2014-01-13 DIAGNOSIS — R2689 Other abnormalities of gait and mobility: Secondary | ICD-10-CM

## 2014-01-13 DIAGNOSIS — IMO0001 Reserved for inherently not codable concepts without codable children: Secondary | ICD-10-CM | POA: Diagnosis not present

## 2014-01-13 NOTE — Evaluation (Signed)
Physical Therapy Evaluation  Patient Details  Name: NYNA CHILTON MRN: [id number] Date of Birth: 1941/06/29  Today's Date: 01/13/2014 Time: 1300-1345 PT Time Calculation (min): 45 min Mm test 1300-1310; there ex 2130-3974; self care 1222-1344             Visit#: 26 of 26  Re-eval: 01/13/14    Authorization: Medicare    Authorization Time Period: Roselind Rily complete 19th visit  Authorization Visit#: 26 of 26   Past Medical History:  Past Medical History  Diagnosis Date  . CAD in native artery   . Thrombocytopenia, unspecified   . Unspecified hereditary and idiopathic peripheral neuropathy   . Unspecified fall   . Other and unspecified hyperlipidemia   . Unspecified essential hypertension   . Obesity, unspecified   . Shortness of breath   . Personal history of neurosis   . Intrinsic asthma, unspecified   . Allergic rhinitis, cause unspecified   . Cirrhosis of liver     NASH, afp on 06/17/12 =3.7, per pt she had hep B vaccines in 1996, hep A status unknown  . Colon adenomas 03/08/10    tcs by Dr. Jena Gauss  . Hyperplastic polyps of stomach 03/08/10    tcs by Dr. Geni Bers  . Chronic gastritis 03/09/11    egd by Dr. Jena Gauss  . History of hemorrhoids 03/08/10    tcs- internal and external  . Diverticula of colon 03/08/10    L side  . Hiatal hernia 03/08/10  . Esophagitis, erosive 03/08/10  . GERD (gastroesophageal reflux disease)   . Wears glasses   . Type II or unspecified type diabetes mellitus without mention of complication, not stated as uncontrolled   . Vertigo   . Pancreatitis   . Bilateral leg weakness    Past Surgical History:  Past Surgical History  Procedure Laterality Date  . Hysterectomy and btl      s/p  . Abdominal hysterectomy    . Tumor excision  2003    rt arm and left foot  . Colonoscopy  03/08/10    Dr. [name]-->ext/int hemorrhoids, anal paipilla, rectal polyps, desc polyps, cecal polyp, left-sided diverticula. suboptiomal prep. next TCS 02/2015. multiple adenomas  .  Esophagogastroduodenoscopy  03/08/10    Dr. Dellie Catholic erosive RE, small hh, antral erosions, small 1cm are of mucosal indentation along gastric body of doubtful significance, cystic nodularity of hypophyarynx, base of tongue, base of epiglottis. benign gastric biopsies  . Tubal ligation    . Esophagogastroduodenoscopy  10/08/2011    Dr. Dionisio David esophageal varices, antral erosion. next egd 09/2013  . Foot surgery  2003    lt foot  . Eye surgery      cataracts bilateral  . Orif humerus fracture Right 02/17/2013    Procedure: OPEN REDUCTION INTERNAL FIXATION (ORIF) RIGHT PROXIMAL HUMERUS FRACTURE;  Surgeon: Budd Palmer, MD;  Location: [facility] OR;  Service: Orthopedics;  Laterality: Right;  . Partial gastrectomy      family denies  . Breast surgery    . Cardiac catheterization  02/25/2007    Dr. Clarene Duke:  normal LV systolic function, mild irregularities of LAD    Subjective Symptoms/Limitations Symptoms: Pt states she is trying to walk more and is doing her twice a day How long can you walk comfortably?: 15 minute  Pain Assessment Currently in Pain?: Yes Pain Score: 4  Pain Location: Shoulder Pain Orientation: Right Pain Type: Chronic pain  Precautions/Restrictions  Precautions Precautions: Fall Restrictions Weight Bearing Restrictions: No Other Position/Activity Restrictions: patient  has bone sitmulator to wear on RUE 2x/daily     Prior [place] expects to be discharged to:: Private residence Living Arrangements: Spouse/significant other Prior Function Level of Independence: Independent with basic ADLs Driving: No Vocation: Retired Leisure: Hobbies-yes (Comment) Comments: TV, puzzles, go out with grandchildren/family, going shopping   Cognition/Observation Cognition Overall Cognitive Status: Within Functional Limits for tasks assessed Arousal/Alertness: Awake/alert Orientation Level: Oriented X4 Observation/Other  Assessments Observations: Pt fatigued, short of breath due to recent illness. O2 at 95%  Sensation/Coordination/Flexibility/Functional Tests Sensation Light Touch: Appears Intact Coordination Gross Motor Movements are Fluid and Coordinated: Yes Fine Motor Movements are Fluid and Coordinated: Yes  Assessment RLE Strength Right Hip Extension: 4/5 (was 3+/5) Right Hip ABduction:  (4-/5 was 4-/5) LLE Strength Left Hip Extension: 4/5 (was 3+/5) Left Hip ABduction:  (4+/5 was 4/5)  Exercise/Treatments Mobility/Balance  Berg Balance Test Sit to Stand: Able to stand without using hands and stabilize independently Standing Unsupported: Able to stand safely 2 minutes Sitting with Back Unsupported but Feet Supported on Floor or Stool: Able to sit safely and securely 2 minutes Stand to Sit: Sits safely with minimal use of hands Transfers: Able to transfer safely, minor use of hands Standing Unsupported with Eyes Closed: Able to stand 10 seconds safely Standing Ubsupported with Feet Together: Able to place feet together independently and stand for 1 minute with supervision From Standing, Reach Forward with Outstretched Arm: Can reach forward >12 cm safely (5") From Standing Position, Pick up Object from Floor: Able to pick up shoe safely and easily From Standing Position, Turn to Look Behind Over each Shoulder: Looks behind from both sides and weight shifts well Turn 360 Degrees: Able to turn 360 degrees safely in 4 seconds or less Standing Unsupported, Alternately Place Feet on Step/Stool: Able to complete 4 steps without aid or supervision Standing Unsupported, One Foot in Front: Able to take small step independently and hold 30 seconds Standing on One Leg: Able to lift leg independently and hold equal to or more than 3 seconds Total Score: 48  Last month was 48 but at evaluation was 34    Seated Other Seated Knee Exercises: sit to stand with one hand support 7x 30 seconds. Other Seated  Knee Exercises: sit to stand no UE support x 5  Supine  glut sets x 10 Ab sets x 10 Sidelying Hip ABduction: Strengthening;10 reps Other Sidelying Knee Exercises: circles x 10 B Physical Therapy Assessment and Plan PT Assessment and Plan Clinical Impression Statement: Pt has made minimal gains in strength with no gains in balance.  Pt main limitation appears to be core strength.  Pt instructed in completing 50-100 gluteal and abdominal sets each day as well as beginning a walking program.  Pt appears to have met maximum functional capacity will discharge from home. PT Plan: discharge pt    Goals Home Exercise Program PT Goal: Perform Home Exercise Program - Progress: Met PT Short Term Goals PT Short Term Goal 1: Pt to be able to complete 10 sit to stand in 30 seconds using one hand hold for increased power to assist in hills and steps PT Short Term Goal 1 - Progress: Progressing toward goal (Pt able to complete 7 sit to stand in 30 seconds.) PT Short Term Goal 2: Pt to be ambulating with a walker x 15 minutes everyday PT Short Term Goal 2 - Progress: Not met (Pt walks 5-6 times a day but for short durations) PT Short Term  Goal 3: Pt Berg to improve by 5 pt to improve safety and decrease risk of falling PT Short Term Goal 3 - Progress: Met PT Long Term Goals Time to Complete Long Term Goals: 4 weeks PT Long Term Goal 1: Pt to be able to complet 5 sit to stand with no hand assist for improved balance and power PT Long Term Goal 1 - Progress: Met PT Long Term Goal 2: Pt to be ambulating with a walker for 30 minutes. PT Long Term Goal 2 - Progress: Not met Long Term Goal 3: Berg score to be improved by 10 pts to improve safety and reduce risk of falling Long Term Goal 3 Progress: Met Long Term Goal 4: Pt to have had no falls Long Term Goal 4 Progress: Met PT Long Term Goal 5: Pt to be doing light activites while standing ie dishes, folding clothes... Long Term Goal 5 Progress: Met PT  Long Term Goal 6: Pt improve core musculature and posture to report decrease LBP scale 3/10 during gait for 15 minutes  (new goal set 12/16/2013) decreased back pain but not walking   Problem List Patient Active Problem List   Diagnosis Date Noted  . Poor balance 10/14/2013  . Repeated falls 10/14/2013  . Acute encephalopathy 07/30/2013  . Encephalopathy, hepatic 07/30/2013  . Confusion 07/29/2013  . Pancreatitis 04/26/2013  . Edema of left lower extremity 04/14/2013  . Liver cirrhosis secondary to NASH 04/14/2013  . Generalized weakness 02/19/2013  . Acute blood loss anemia 02/18/2013  . UTI (urinary tract infection) 02/14/2013  . Encephalopathy acute 02/14/2013  . Hyponatremia 02/14/2013  . Hypokalemia 02/14/2013  . Fracture of humeral shaft, right, closed 02/11/2013  . Multiple falls 01/26/2013  . Difficulty walking 01/26/2013  . Bilateral leg weakness 01/26/2013  . Personal history of fall 09/02/2012  . Dizziness 09/02/2012  . Frequent falls 08/08/2012  . Ataxia 08/08/2012  . Peripheral neuropathy 08/08/2012  . Preoperative clearance 06/06/2012  . 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 Anemia 04/08/2012  . Renal colic 44/01/4741  . Encephalopathy chronic 09/14/2011  . Posterior tibial tendon dysfunction 07/12/2011  . Hyperlipidemia 05/31/2011  . Essential hypertension 05/31/2011  . Uncontrolled type 2 diabetes mellitus with complication 59/56/3875  . GERD 09/29/2010  . Other chronic nonalcoholic liver disease 64/33/2951  . COLONIC POLYPS, HX OF 02/13/2010  . CIRRHOSIS 02/07/2010  . THROMBOCYTOPENIA, CHRONIC 01/04/2010  . PERIPHERAL NEUROPATHY 01/04/2010  . CAD, NATIVE VESSEL 01/04/2010  . ANXIETY DISORDER, HX OF 01/04/2010  . Intrinsic asthma, unspecified 03/16/2009  . ALLERGIC RHINITIS 02/18/2009  . DYSPNEA 02/18/2009  . DIABETES, TYPE 2 02/04/2009  . HYPERLIPIDEMIA 02/04/2009  . OBESITY 02/04/2009  . HYPERTENSION 02/04/2009     General Behavior During Therapy: WFL for tasks assessed/performed PT Plan of Care PT Home Exercise Plan: new hep given to pt  GP Functional Assessment Tool Used: Clinical judgment  Functional Limitation: Changing and maintaining body position Changing and Maintaining Body Position Goal Status ([id number]): At least 20 percent but less than 40 percent impaired, limited or restricted Changing and Maintaining Body Position Discharge Status [phone]): At least 20 percent but less than 40 percent impaired, limited or restricted  Kani Jobson,CINDY 01/13/2014, 4:16 PM  Physician Documentation Your signature is required to indicate approval of the treatment plan as stated above.  Please sign and either send electronically or make a copy of this report for your files and return this physician signed original.   Please mark one 1.__approve  of plan  2. ___approve of plan with the following conditions.   ______________________________                                                          _____________________ Physician Signature                                                                                                             Date

## 2014-01-13 NOTE — Progress Notes (Signed)
Occupational Therapy Treatment Patient Details  Name: [name] [name] MRN: [id number] Date of Birth: October 23, 1941  Today's Date: 01/13/2014 Time: 1353-1430 OT Time Calculation (min): 37 min MFR 1353-1410 17' Therex 1410-1430 20'   Visit#: 12 of 16  Re-eval: 01/22/14    Authorization: [employer] Medicare  Authorization Time Period: before 17th visit  Authorization Visit#: 12 of 17  Subjective Symptoms/Limitations Symptoms: S: it feels tight today. Pain Assessment Currently in Pain?: Yes Pain Score: 4  Pain Location: Shoulder Pain Orientation: Right Pain Type: Chronic pain  Precautions/Restrictions  Precautions Precautions: Fall Restrictions Weight Bearing Restrictions: No Other Position/Activity Restrictions: patient has bone sitmulator to wear on RUE 2x/daily   Exercise/Treatments Supine Protraction: PROM;10 reps;AROM;15 reps Protraction Weight (lbs): 1 Horizontal ABduction: PROM;10 reps;AROM;12 reps Horizontal ABduction Weight (lbs): 1 External Rotation: PROM;10 reps;AROM;15 reps External Rotation Weight (lbs): 1 Internal Rotation: PROM;10 reps;AROM;15 reps Internal Rotation Weight (lbs): 1 Flexion: PROM;10 reps;AROM;15 reps Shoulder Flexion Weight (lbs): 1 ABduction: PROM;10 reps;AROM;15 reps Shoulder ABduction Weight (lbs): 1 ABduction Limitations: to 90 degrees Seated Protraction: AAROM;15 reps Horizontal ABduction: AAROM;15 reps Flexion: AAROM;15 reps    Manual Therapy Manual Therapy: Myofascial release Myofascial Release: MFR and manual stretching to right upper arm, scapular, and shoulder region to decrease pain and restrictions and increase pain free mobility.  Occupational Therapy Assessment and Plan OT Assessment and Plan Clinical Impression Statement: A: Patient had occassionaly "wobbles" when walking with RW. Pt seems to get RW hooked on feet when manivuering without realizing it. Patient continues to have decreased joint mobility in RUE  although tolerates passive stretching well. Required occassionaly physical, verbal, and visual cues for proper form during exercises.  OT Plan: P: Work on increasing joint mobility to increase functional reaching ability.    Goals Home Exercise Program PT Goal: Perform Home Exercise Program - Progress: Met Short Term Goals Short Term Goal 1: Paatient will be edcuated on HEP  Short Term Goal 1 Progress: Progressing toward goal Short Term Goal 2: Patient will improve PROM in right shoulder and elbow to [facility] for increased ability to fix her hair.  Short Term Goal 2 Progress: Progressing toward goal Short Term Goal 3: Patient will decrease right shoulder pain to 3/10 when completing functional daily  activities.  Short Term Goal 4: Patient will improve right shoulder strength to 3+/5 for increased ability to manipulate bra and improve functional mobility/safety Short Term Goal 4 Progress: Progressing toward goal Short Term Goal 5: Patient will decrease right shoulder fascial restrictions to min in her right shoulder region.  Long Term Goals Long Term Goal 1: Patient will return to prior level of independence with all B/IADLs and leisure activities. Long Term Goal 1 Progress: Progressing toward goal Long Term Goal 2: Patient will improve right shoulder strength to >/= 4/5 for increased ability to perform  Long Term Goal 2 Progress: Progressing toward goal Long Term Goal 3: Patient will improve RUE AROM to [facility] for increased ability to reach into overhead cabinets.  Long Term Goal 3 Progress: Progressing toward goal Long Term Goal 4: Patient will decrease right shoulder pain to </=1/10 when completing functional activities.  Long Term Goal 4 Progress: Progressing toward goal Long Term Goal 5: Patient will decrease  fascial restrictions to trace in right shoulder region. Long Term Goal 5 Progress: Progressing toward goal  Problem List Patient Active Problem List   Diagnosis Date Noted  . Poor  balance 10/14/2013  . Repeated falls 10/14/2013  . Acute encephalopathy 07/30/2013  .  Encephalopathy, hepatic 07/30/2013  . Confusion 07/29/2013  . Pancreatitis 04/26/2013  . Edema of left lower extremity 04/14/2013  . Liver cirrhosis secondary to NASH 04/14/2013  . Generalized weakness 02/19/2013  . Acute blood loss anemia 02/18/2013  . UTI (urinary tract infection) 02/14/2013  . Encephalopathy acute 02/14/2013  . Hyponatremia 02/14/2013  . Hypokalemia 02/14/2013  . Fracture of humeral shaft, right, closed 02/11/2013  . Multiple falls 01/26/2013  . Difficulty walking 01/26/2013  . Bilateral leg weakness 01/26/2013  . Personal history of fall 09/02/2012  . Dizziness 09/02/2012  . Frequent falls 08/08/2012  . Ataxia 08/08/2012  . Peripheral neuropathy 08/08/2012  . Preoperative clearance 06/06/2012  . 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 Anemia 04/08/2012  . Renal colic 72/25/7505  . Encephalopathy chronic 09/14/2011  . Posterior tibial tendon dysfunction 07/12/2011  . Hyperlipidemia 05/31/2011  . Essential hypertension 05/31/2011  . Uncontrolled type 2 diabetes mellitus with complication 18/33/5825  . GERD 09/29/2010  . Other chronic nonalcoholic liver disease 18/98/4210  . COLONIC POLYPS, HX OF 02/13/2010  . CIRRHOSIS 02/07/2010  . THROMBOCYTOPENIA, CHRONIC 01/04/2010  . PERIPHERAL NEUROPATHY 01/04/2010  . CAD, NATIVE VESSEL 01/04/2010  . ANXIETY DISORDER, HX OF 01/04/2010  . Intrinsic asthma, unspecified 03/16/2009  . ALLERGIC RHINITIS 02/18/2009  . DYSPNEA 02/18/2009  . DIABETES, TYPE 2 02/04/2009  . HYPERLIPIDEMIA 02/04/2009  . OBESITY 02/04/2009  . HYPERTENSION 02/04/2009    End of Session Activity Tolerance: Patient tolerated treatment well General Behavior During Therapy: [facility] for tasks assessed/performed   Ailene Ravel, OTR/L,CBIS   01/13/2014, 4:10 PM

## 2014-01-15 ENCOUNTER — Ambulatory Visit (HOSPITAL_COMMUNITY): Payer: Medicare Other

## 2014-01-15 ENCOUNTER — Ambulatory Visit (HOSPITAL_COMMUNITY)
Admission: RE | Admit: 2014-01-15 | Discharge: 2014-01-15 | Disposition: A | Discharge status: Home / Self Care | Payer: Medicare Other | Source: Ambulatory Visit | Attending: Family Medicine | Admitting: Family Medicine

## 2014-01-15 DIAGNOSIS — IMO0001 Reserved for inherently not codable concepts without codable children: Secondary | ICD-10-CM | POA: Diagnosis not present

## 2014-01-15 NOTE — Progress Notes (Signed)
Occupational Therapy Treatment Patient Details  Name: [name] [name] MRN: [id number] Date of Birth: 05-07-1941  Today's Date: 01/15/2014 Time: 1829-9371 OT Time Calculation (min): 36 min MFR 6967-8938 12' Therex 1017-5102 24'  Visit#: 13 of 16  Re-eval: 01/22/14    Authorization: [employer] Medicare  Authorization Time Period: before 17th visit  Authorization Visit#: 13 of 17  Subjective Symptoms/Limitations Symptoms: S: My shoulder has been really sore lately.  Pain Assessment Currently in Pain?: Yes Pain Score: 5  Pain Location: Shoulder Pain Orientation: Right Pain Type: Chronic pain  Precautions/Restrictions  Precautions Precautions: Fall  Exercise/Treatments Supine Protraction: PROM;10 reps;Strengthening;15 reps;Weights Protraction Weight (lbs): 1 Horizontal ABduction: PROM;10 reps;Strengthening;15 reps Horizontal ABduction Weight (lbs): 1 External Rotation: PROM;10 reps;Strengthening;15 reps External Rotation Weight (lbs): 1 Internal Rotation: PROM;10 reps;Strengthening;15 reps Internal Rotation Weight (lbs): 1 Flexion: PROM;10 reps;Strengthening;15 reps Shoulder Flexion Weight (lbs): 1 ABduction: PROM;10 reps;Strengthening;15 reps Shoulder ABduction Weight (lbs): 1 Seated Other Seated Exercises: functional reaching with 6 cones placed/removed from cabinet shelf. ROM / Strengthening / Isometric Strengthening Proximal Shoulder Strengthening, Supine: 10X all directions. Proximal Shoulder Strengthening, Seated: 10X all directions        Manual Therapy Manual Therapy: Myofascial release Myofascial Release: MFR and manual stretching to right upper arm, scapular, and shoulder region to decrease pain and restrictions and increase pain free mobility  Occupational Therapy Assessment and Plan OT Assessment and Plan Clinical Impression Statement: A: Pt needed physical and verbal cues to complete exercise for proper form and posture.  OT Plan: P: Work on  increasing joint mobility to increase functional reaching ability. Become more independent when completing exercises requiring less direction. Work on increasing posture during exercises.    Goals Short Term Goals Short Term Goal 1: Paatient will be edcuated on HEP  Short Term Goal 2: Patient will improve PROM in right shoulder and elbow to [facility] for increased ability to fix her hair.  Short Term Goal 3: Patient will decrease right shoulder pain to 3/10 when completing functional daily  activities.  Short Term Goal 4: Patient will improve right shoulder strength to 3+/5 for increased ability to manipulate bra and improve functional mobility/safety Short Term Goal 5: Patient will decrease right shoulder fascial restrictions to min in her right shoulder region.  Long Term Goals Long Term Goal 1: Patient will return to prior level of independence with all B/IADLs and leisure activities. Long Term Goal 2: Patient will improve right shoulder strength to >/= 4/5 for increased ability to perform  Long Term Goal 3: Patient will improve RUE AROM to [facility] for increased ability to reach into overhead cabinets.  Long Term Goal 4: Patient will decrease right shoulder pain to </=1/10 when completing functional activities.  Long Term Goal 5: Patient will decrease  fascial restrictions to trace in right shoulder region.  Problem List Patient Active Problem List   Diagnosis Date Noted  . Poor balance 10/14/2013  . Repeated falls 10/14/2013  . Acute encephalopathy 07/30/2013  . Encephalopathy, hepatic 07/30/2013  . Confusion 07/29/2013  . Pancreatitis 04/26/2013  . Edema of left lower extremity 04/14/2013  . Liver cirrhosis secondary to NASH 04/14/2013  . Generalized weakness 02/19/2013  . Acute blood loss anemia 02/18/2013  . UTI (urinary tract infection) 02/14/2013  . Encephalopathy acute 02/14/2013  . Hyponatremia 02/14/2013  . Hypokalemia 02/14/2013  . Fracture of humeral shaft, right, closed  02/11/2013  . Multiple falls 01/26/2013  . Difficulty walking 01/26/2013  . Bilateral leg weakness 01/26/2013  . Personal  history of fall 09/02/2012  . Dizziness 09/02/2012  . Frequent falls 08/08/2012  . Ataxia 08/08/2012  . Peripheral neuropathy 08/08/2012  . Preoperative clearance 06/06/2012  . 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 Anemia 04/08/2012  . Renal colic 26/71/2458  . Encephalopathy chronic 09/14/2011  . Posterior tibial tendon dysfunction 07/12/2011  . Hyperlipidemia 05/31/2011  . Essential hypertension 05/31/2011  . Uncontrolled type 2 diabetes mellitus with complication 09/98/3382  . GERD 09/29/2010  . Other chronic nonalcoholic liver disease 50/53/9767  . COLONIC POLYPS, HX OF 02/13/2010  . CIRRHOSIS 02/07/2010  . THROMBOCYTOPENIA, CHRONIC 01/04/2010  . PERIPHERAL NEUROPATHY 01/04/2010  . CAD, NATIVE VESSEL 01/04/2010  . ANXIETY DISORDER, HX OF 01/04/2010  . Intrinsic asthma, unspecified 03/16/2009  . ALLERGIC RHINITIS 02/18/2009  . DYSPNEA 02/18/2009  . DIABETES, TYPE 2 02/04/2009  . HYPERLIPIDEMIA 02/04/2009  . OBESITY 02/04/2009  . HYPERTENSION 02/04/2009    End of Session Activity Tolerance: Patient tolerated treatment well General Behavior During Therapy: [facility] for tasks assessed/performed   Ailene Ravel, OTR/L,CBIS   01/15/2014, 2:33 PM

## 2014-01-18 ENCOUNTER — Ambulatory Visit (HOSPITAL_COMMUNITY): Payer: Medicare Other

## 2014-01-18 ENCOUNTER — Ambulatory Visit (HOSPITAL_COMMUNITY)
Admission: RE | Admit: 2014-01-18 | Discharge: 2014-01-18 | Disposition: A | Discharge status: Home / Self Care | Payer: Medicare Other | Source: Ambulatory Visit | Attending: Family Medicine | Admitting: Family Medicine

## 2014-01-18 DIAGNOSIS — IMO0001 Reserved for inherently not codable concepts without codable children: Secondary | ICD-10-CM | POA: Diagnosis not present

## 2014-01-18 NOTE — Progress Notes (Signed)
Occupational Therapy Treatment Patient Details  Name: [name] [name] MRN: [id number] Date of Birth: [birth year]  Today's Date: 01/18/2014 Time: 1027-1105 OT Time Calculation (min): 38 min Manual 1027-1040 (13') Therapeutic Exericses 1040-1105 (25')  Visit#: 14 of 16  Re-eval: 01/22/14    Authorization: [employer]  Authorization Time Period: before 17th visit  Authorization Visit#: 14 of 17  Subjective Symptoms/Limitations Symptoms: S: I've got a bruise on my arm now. My pain's up today. I couldn't sleep last night." Pain Assessment Currently in Pain?: Yes Pain Score: 7  Pain Location: Shoulder Pain Orientation: Right  Precautions/Restrictions     Exercise/Treatments Supine Protraction: PROM;10 reps;Strengthening;15 reps;Weights Protraction Weight (lbs): 1 Horizontal ABduction: PROM;10 reps;Strengthening;15 reps Horizontal ABduction Weight (lbs): 1 External Rotation: PROM;10 reps;Strengthening;15 reps External Rotation Weight (lbs): 1 Internal Rotation: PROM;10 reps;Strengthening;15 reps Internal Rotation Weight (lbs): 1 Flexion: PROM;10 reps;Strengthening;15 reps Shoulder Flexion Weight (lbs): 1 ABduction: PROM;10 reps;Strengthening;15 reps Shoulder ABduction Weight (lbs): 1 ABduction Limitations: to 90 degrees Seated Elevation: AROM;15 reps Protraction: AAROM;15 reps Flexion: AAROM;15 reps Abduction: AAROM;15 reps ROM / Strengthening / Isometric Strengthening Proximal Shoulder Strengthening, Supine: 15X all directions.      Manual Therapy Manual Therapy: Myofascial release Myofascial Release: MFR and manual stretching to right upper arm, scapular, and shoulder region to decrease pain and restrictions and increase pain free mobility  Occupational Therapy Assessment and Plan OT Assessment and Plan Clinical Impression Statement: A: Pt had increased soreness this date- had decresaed pain after manual, stretching, and exercises. OT Plan: P:  Functional reaching, posture. AAROM in sitting.   Goals Short Term Goals Short Term Goal 1: Paatient will be edcuated on HEP  Short Term Goal 1 Progress: Progressing toward goal Short Term Goal 2: Patient will improve PROM in right shoulder and elbow to [facility] for increased ability to fix her hair.  Short Term Goal 2 Progress: Progressing toward goal Short Term Goal 3: Patient will decrease right shoulder pain to 3/10 when completing functional daily  activities.  Short Term Goal 3 Progress: Met Short Term Goal 4: Patient will improve right shoulder strength to 3+/5 for increased ability to manipulate bra and improve functional mobility/safety Short Term Goal 4 Progress: Progressing toward goal Short Term Goal 5: Patient will decrease right shoulder fascial restrictions to min in her right shoulder region.  Short Term Goal 5 Progress: Met Long Term Goals Long Term Goal 1: Patient will return to prior level of independence with all B/IADLs and leisure activities. Long Term Goal 1 Progress: Progressing toward goal Long Term Goal 2: Patient will improve right shoulder strength to >/= 4/5 for increased ability to perform  Long Term Goal 2 Progress: Progressing toward goal Long Term Goal 3: Patient will improve RUE AROM to [facility] for increased ability to reach into overhead cabinets.  Long Term Goal 3 Progress: Progressing toward goal Long Term Goal 4: Patient will decrease right shoulder pain to </=1/10 when completing functional activities.  Long Term Goal 4 Progress: Progressing toward goal Long Term Goal 5: Patient will decrease  fascial restrictions to trace in right shoulder region. Long Term Goal 5 Progress: Progressing toward goal  Problem List Patient Active Problem List   Diagnosis Date Noted  . Poor balance 10/14/2013  . Repeated falls 10/14/2013  . Acute encephalopathy 07/30/2013  . Encephalopathy, hepatic 07/30/2013  . Confusion 07/29/2013  . Pancreatitis 04/26/2013  . Edema of  left lower extremity 04/14/2013  . Liver cirrhosis secondary to NASH 04/14/2013  . Generalized weakness  02/19/2013  . Acute blood loss anemia 02/18/2013  . UTI (urinary tract infection) 02/14/2013  . Encephalopathy acute 02/14/2013  . Hyponatremia 02/14/2013  . Hypokalemia 02/14/2013  . Fracture of humeral shaft, right, closed 02/11/2013  . Multiple falls 01/26/2013  . Difficulty walking 01/26/2013  . Bilateral leg weakness 01/26/2013  . Personal history of fall 09/02/2012  . Dizziness 09/02/2012  . Frequent falls 08/08/2012  . Ataxia 08/08/2012  . Peripheral neuropathy 08/08/2012  . Preoperative clearance 06/06/2012  . 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 Anemia 04/08/2012  . Renal colic 28/40/6986  . Encephalopathy chronic 09/14/2011  . Posterior tibial tendon dysfunction 07/12/2011  . Hyperlipidemia 05/31/2011  . Essential hypertension 05/31/2011  . Uncontrolled type 2 diabetes mellitus with complication 14/83/0735  . GERD 09/29/2010  . Other chronic nonalcoholic liver disease 43/11/4838  . COLONIC POLYPS, HX OF 02/13/2010  . CIRRHOSIS 02/07/2010  . THROMBOCYTOPENIA, CHRONIC 01/04/2010  . PERIPHERAL NEUROPATHY 01/04/2010  . CAD, NATIVE VESSEL 01/04/2010  . ANXIETY DISORDER, HX OF 01/04/2010  . Intrinsic asthma, unspecified 03/16/2009  . ALLERGIC RHINITIS 02/18/2009  . DYSPNEA 02/18/2009  . DIABETES, TYPE 2 02/04/2009  . HYPERLIPIDEMIA 02/04/2009  . OBESITY 02/04/2009  . HYPERTENSION 02/04/2009    End of Session Activity Tolerance: Patient tolerated treatment well General Behavior During Therapy: [facility] for tasks assessed/performed  GO    Bea Graff, MS, OTR/L [phone]  01/18/2014, 12:05 PM

## 2014-01-20 ENCOUNTER — Ambulatory Visit (HOSPITAL_COMMUNITY): Payer: Medicare Other | Admitting: *Deleted

## 2014-01-20 ENCOUNTER — Ambulatory Visit (HOSPITAL_COMMUNITY)
Admission: RE | Admit: 2014-01-20 | Discharge: 2014-01-20 | Disposition: A | Discharge status: Home / Self Care | Payer: Medicare Other | Source: Ambulatory Visit | Attending: Family Medicine | Admitting: Family Medicine

## 2014-01-20 DIAGNOSIS — IMO0001 Reserved for inherently not codable concepts without codable children: Secondary | ICD-10-CM | POA: Diagnosis not present

## 2014-01-20 NOTE — Progress Notes (Signed)
Occupational Therapy Treatment Patient Details  Name: [name] [name] MRN: [id number] Date of Birth: 31-Mar-1941  Today's Date: 01/20/2014 Time: 1355-1430 OT Time Calculation (min): 35 min MFR 1355-1405 11' Therex 3818-2993 24'   Visit#: 15 of 16  Re-eval: 01/22/14    Authorization: [employer] Medicare  Authorization Time Period: before 17th visit  Authorization Visit#: 15 of 17  Subjective Symptoms/Limitations Symptoms: S: I didn't sleep well last night. I ran out of icy hot.  Pain Assessment Currently in Pain?: Yes Pain Score: 6  Pain Location: Shoulder Pain Orientation: Right Pain Type: Chronic pain  Precautions/Restrictions  Precautions Precautions: Fall  Exercise/Treatments Supine Protraction: PROM;10 reps;Strengthening;15 reps;Weights Protraction Weight (lbs): 1 Horizontal ABduction: PROM;10 reps;Strengthening;15 reps Horizontal ABduction Weight (lbs): 1 External Rotation: PROM;10 reps;Strengthening;15 reps External Rotation Weight (lbs): 1 Internal Rotation: PROM;10 reps;Strengthening;15 reps Internal Rotation Weight (lbs): 1 Flexion: PROM;10 reps;Strengthening;15 reps Shoulder Flexion Weight (lbs): 1 ABduction: PROM;10 reps;Strengthening;15 reps Shoulder ABduction Weight (lbs): 1 Seated Other Seated Exercises: functional reaching with 8 cones placed/removed from cabinet shelf. ROM / Strengthening / Isometric Strengthening Proximal Shoulder Strengthening, Supine: 10x all direction. max vc for proper form and technique Rhythmic Stabilization, Supine: 10X           Manual Therapy Manual Therapy: Myofascial release Myofascial Release: MFR and manual stretching to right upper arm, scapular, and shoulder region to decrease pain and restrictions and increase pain free mobility  Occupational Therapy Assessment and Plan OT Assessment and Plan Clinical Impression Statement: A: Added shoulder stability exercise in supine. patient required max vc's to  complete proximal shoulder exercise. Increase joint mobility during manual stretching.  OT Plan: P: Reassess. AAROM in sitting.   Goals Short Term Goals Short Term Goal 1: Paatient will be edcuated on HEP  Short Term Goal 1 Progress: Progressing toward goal Short Term Goal 2: Patient will improve PROM in right shoulder and elbow to [facility] for increased ability to fix her hair.  Short Term Goal 2 Progress: Progressing toward goal Short Term Goal 3: Patient will decrease right shoulder pain to 3/10 when completing functional daily  activities.  Short Term Goal 4: Patient will improve right shoulder strength to 3+/5 for increased ability to manipulate bra and improve functional mobility/safety Short Term Goal 4 Progress: Progressing toward goal Short Term Goal 5: Patient will decrease right shoulder fascial restrictions to min in her right shoulder region.  Long Term Goals Long Term Goal 1: Patient will return to prior level of independence with all B/IADLs and leisure activities. Long Term Goal 1 Progress: Progressing toward goal Long Term Goal 2: Patient will improve right shoulder strength to >/= 4/5 for increased ability to perform  Long Term Goal 2 Progress: Progressing toward goal Long Term Goal 3: Patient will improve RUE AROM to [facility] for increased ability to reach into overhead cabinets.  Long Term Goal 3 Progress: Progressing toward goal Long Term Goal 4: Patient will decrease right shoulder pain to </=1/10 when completing functional activities.  Long Term Goal 4 Progress: Progressing toward goal Long Term Goal 5: Patient will decrease  fascial restrictions to trace in right shoulder region. Long Term Goal 5 Progress: Progressing toward goal  Problem List Patient Active Problem List   Diagnosis Date Noted  . Poor balance 10/14/2013  . Repeated falls 10/14/2013  . Acute encephalopathy 07/30/2013  . Encephalopathy, hepatic 07/30/2013  . Confusion 07/29/2013  . Pancreatitis 04/26/2013   . Edema of left lower extremity 04/14/2013  . Liver cirrhosis secondary to  NASH 04/14/2013  . Generalized weakness 02/19/2013  . Acute blood loss anemia 02/18/2013  . UTI (urinary tract infection) 02/14/2013  . Encephalopathy acute 02/14/2013  . Hyponatremia 02/14/2013  . Hypokalemia 02/14/2013  . Fracture of humeral shaft, right, closed 02/11/2013  . Multiple falls 01/26/2013  . Difficulty walking 01/26/2013  . Bilateral leg weakness 01/26/2013  . Personal history of fall 09/02/2012  . Dizziness 09/02/2012  . Frequent falls 08/08/2012  . Ataxia 08/08/2012  . Peripheral neuropathy 08/08/2012  . Preoperative clearance 06/06/2012  . 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 Anemia 04/08/2012  . Renal colic 79/39/0300  . Encephalopathy chronic 09/14/2011  . Posterior tibial tendon dysfunction 07/12/2011  . Hyperlipidemia 05/31/2011  . Essential hypertension 05/31/2011  . Uncontrolled type 2 diabetes mellitus with complication 92/33/0076  . GERD 09/29/2010  . Other chronic nonalcoholic liver disease 22/63/3354  . COLONIC POLYPS, HX OF 02/13/2010  . CIRRHOSIS 02/07/2010  . THROMBOCYTOPENIA, CHRONIC 01/04/2010  . PERIPHERAL NEUROPATHY 01/04/2010  . CAD, NATIVE VESSEL 01/04/2010  . ANXIETY DISORDER, HX OF 01/04/2010  . Intrinsic asthma, unspecified 03/16/2009  . ALLERGIC RHINITIS 02/18/2009  . DYSPNEA 02/18/2009  . DIABETES, TYPE 2 02/04/2009  . HYPERLIPIDEMIA 02/04/2009  . OBESITY 02/04/2009  . HYPERTENSION 02/04/2009    End of Session Activity Tolerance: Patient tolerated treatment well General Behavior During Therapy: [facility] for tasks assessed/performed  Ailene Ravel, OTR/L,CBIS   01/20/2014, 2:34 PM

## 2014-01-22 ENCOUNTER — Ambulatory Visit (HOSPITAL_COMMUNITY)
Admission: RE | Admit: 2014-01-22 | Discharge: 2014-01-22 | Disposition: A | Discharge status: Home / Self Care | Payer: Medicare Other | Source: Ambulatory Visit | Attending: Family Medicine | Admitting: Family Medicine

## 2014-01-22 ENCOUNTER — Ambulatory Visit (HOSPITAL_COMMUNITY): Payer: Medicare Other

## 2014-01-22 DIAGNOSIS — IMO0001 Reserved for inherently not codable concepts without codable children: Secondary | ICD-10-CM | POA: Diagnosis not present

## 2014-01-22 NOTE — Evaluation (Signed)
Occupational Therapy Re-Evaluation and Treatment   Patient Details  Name: [name] [name] MRN: [id number] Date of Birth: [birth year]  Today's Date: 01/22/2014 Time: 4562-5638 OT Time Calculation (min): 21 min MFR 1356-1410  Reassess [id number] 7'  Visit#: 65 of 36  Re-eval: 02/12/14  Assessment Diagnosis: fall s/p right proximal humerus fracture  Authorization: [employer] Medicare  Authorization Time Period: before 26th visit  Authorization Visit#: 53 of 68   Past Medical History:  Past Medical History  Diagnosis Date  . CAD in native artery   . Thrombocytopenia, unspecified   . Unspecified hereditary and idiopathic peripheral neuropathy   . Unspecified fall   . Other and unspecified hyperlipidemia   . Unspecified essential hypertension   . Obesity, unspecified   . Shortness of breath   . Personal history of neurosis   . Intrinsic asthma, unspecified   . Allergic rhinitis, cause unspecified   . Cirrhosis of liver     NASH, afp on 06/17/12 =3.7, per pt she had hep B vaccines in 1996, hep A status unknown  . Colon adenomas 03/08/10    tcs by Dr. Gala Romney  . Hyperplastic polyps of stomach 03/08/10    tcs by Dr. Dudley Major  . Chronic gastritis 03/09/11    egd by Dr. Gala Romney  . History of hemorrhoids 03/08/10    tcs- internal and external  . Diverticula of colon 03/08/10    L side  . Hiatal hernia 03/08/10  . Esophagitis, erosive 03/08/10  . GERD (gastroesophageal reflux disease)   . Wears glasses   . Type II or unspecified type diabetes mellitus without mention of complication, not stated as uncontrolled   . Vertigo   . Pancreatitis   . Bilateral leg weakness    Past Surgical History:  Past Surgical History  Procedure Laterality Date  . Hysterectomy and btl      s/p  . Abdominal hysterectomy    . Tumor excision  2003    rt arm and left foot  . Colonoscopy  03/08/10    Dr. [name]-->ext/int hemorrhoids, anal paipilla, rectal polyps, desc polyps, cecal polyp, left-sided  diverticula. suboptiomal prep. next TCS 02/2015. multiple adenomas  . Esophagogastroduodenoscopy  03/08/10    Dr. [name] erosive RE, small hh, antral erosions, small 1cm are of mucosal indentation along gastric body of doubtful significance, cystic nodularity of hypophyarynx, base of tongue, base of epiglottis. benign gastric biopsies  . Tubal ligation    . Esophagogastroduodenoscopy  10/08/2011    Dr. [name] esophageal varices, antral erosion. next egd 09/2013  . Foot surgery  2003    lt foot  . Eye surgery      cataracts bilateral  . Orif humerus fracture Right 02/17/2013    Procedure: OPEN REDUCTION INTERNAL FIXATION (ORIF) RIGHT PROXIMAL HUMERUS FRACTURE;  Surgeon: [name], MD;  Location: [facility];  Service: Orthopedics;  Laterality: Right;  . Partial gastrectomy      family denies  . Breast surgery    . Cardiac catheterization  02/25/2007    Dr. [name]:  normal LV systolic function, mild irregularities of LAD    Subjective Symptoms/Limitations Symptoms: S: I feel like therapy has really been helping me.  Pain Assessment Currently in Pain?: Yes Pain Score: 4  Pain Location: Shoulder Pain Orientation: Right Pain Type: Chronic pain  Precautions/Restrictions  Precautions Precautions: Fall   Assessment Additional Assessments RUE AROM (degrees) RUE Overall AROM Comments: measurement taken in sitting  Right Shoulder Flexion: 95 Degrees (last progress note: 95)  Right Shoulder ABduction: 95 Degrees (last progress note: 105) Right Shoulder Internal Rotation: 90 Degrees (last progress note: 80) Right Shoulder External Rotation: 86 Degrees (last progress note: 63) RUE Strength RUE Overall Strength Comments: assessed in sitting  Right Shoulder Flexion: 3-/5 (same last progress note) Right Shoulder ABduction: 3-/5 (same last progress note) Right Shoulder Internal Rotation:  (4-/5 (last progress note: same)) Right Shoulder External Rotation: 3+/5 (last progress note:  3-/5) Palpation Palpation: Min fascial restriction in right upper arm, scapularis, and trapezius region.     Exercise/Treatments    Manual Therapy Manual Therapy: Myofascial release Myofascial Release: MFR and manual stretching to right upper arm, scapular, and shoulder region to decrease pain and restrictions and increase pain free mobility  Occupational Therapy Assessment and Plan OT Assessment and Plan Clinical Impression Statement: A: Reassessment completed this date. See MD note for progress. Patient has met 3/5 STGs with another one partily met and 0/5 LTGs. Patient states that she continues to have difficulty reaching up overhead and out to the side although it has gotten easier. Discussed that although patient has met minimum progress towards therapy goals we will continue therapy for 3 more weeks and discharge if  no more progress has been made. Added 1# handweight exercises to HEP and reviewed.  OT Frequency: Min 2X/week OT Duration:  (3 weeks) OT Plan: P: AAROM in sitting. Work on control of movements during exercises.    Goals Short Term Goals Short Term Goal 1: Paatient will be edcuated on HEP  Short Term Goal 1 Progress: Met Short Term Goal 2: Patient will improve PROM in right shoulder and elbow to [facility] for increased ability to fix her hair.  Short Term Goal 2 Progress: Progressing toward goal Short Term Goal 3: Patient will decrease right shoulder pain to 3/10 when completing functional daily  activities.  Short Term Goal 4: Patient will improve right shoulder strength to 3+/5 for increased ability to manipulate bra and improve functional mobility/safety Short Term Goal 4 Progress: Partly met Short Term Goal 5: Patient will decrease right shoulder fascial restrictions to min in her right shoulder region.  Long Term Goals Long Term Goal 1: Patient will return to prior level of independence with all B/IADLs and leisure activities. Long Term Goal 1 Progress: Progressing  toward goal Long Term Goal 2: Patient will improve right shoulder strength to >/= 4/5 for increased ability to perform  Long Term Goal 2 Progress: Progressing toward goal Long Term Goal 3: Patient will improve RUE AROM to [facility] for increased ability to reach into overhead cabinets.  Long Term Goal 3 Progress: Progressing toward goal Long Term Goal 4: Patient will decrease right shoulder pain to </=1/10 when completing functional activities.  Long Term Goal 4 Progress: Progressing toward goal Long Term Goal 5: Patient will decrease  fascial restrictions to trace in right shoulder region. Long Term Goal 5 Progress: Progressing toward goal  Problem List Patient Active Problem List   Diagnosis Date Noted  . Poor balance 10/14/2013  . Repeated falls 10/14/2013  . Acute encephalopathy 07/30/2013  . Encephalopathy, hepatic 07/30/2013  . Confusion 07/29/2013  . Pancreatitis 04/26/2013  . Edema of left lower extremity 04/14/2013  . Liver cirrhosis secondary to NASH 04/14/2013  . Generalized weakness 02/19/2013  . Acute blood loss anemia 02/18/2013  . UTI (urinary tract infection) 02/14/2013  . Encephalopathy acute 02/14/2013  . Hyponatremia 02/14/2013  . Hypokalemia 02/14/2013  . Fracture of humeral shaft, right, closed 02/11/2013  . Multiple falls  01/26/2013  . Difficulty walking 01/26/2013  . Bilateral leg weakness 01/26/2013  . Personal history of fall 09/02/2012  . Dizziness 09/02/2012  . Frequent falls 08/08/2012  . Ataxia 08/08/2012  . Peripheral neuropathy 08/08/2012  . Preoperative clearance 06/06/2012  . 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 Anemia 04/08/2012  . Renal colic 61/22/4497  . Encephalopathy chronic 09/14/2011  . Posterior tibial tendon dysfunction 07/12/2011  . Hyperlipidemia 05/31/2011  . Essential hypertension 05/31/2011  . Uncontrolled type 2 diabetes mellitus with complication 53/00/5110  . GERD 09/29/2010  . Other chronic  nonalcoholic liver disease 21/09/7355  . COLONIC POLYPS, HX OF 02/13/2010  . CIRRHOSIS 02/07/2010  . THROMBOCYTOPENIA, CHRONIC 01/04/2010  . PERIPHERAL NEUROPATHY 01/04/2010  . CAD, NATIVE VESSEL 01/04/2010  . ANXIETY DISORDER, HX OF 01/04/2010  . Intrinsic asthma, unspecified 03/16/2009  . ALLERGIC RHINITIS 02/18/2009  . DYSPNEA 02/18/2009  . DIABETES, TYPE 2 02/04/2009  . HYPERLIPIDEMIA 02/04/2009  . OBESITY 02/04/2009  . HYPERTENSION 02/04/2009    End of Session Activity Tolerance: Patient tolerated treatment well General Behavior During Therapy: [facility] for tasks assessed/performed OT Plan of Care OT Home Exercise Plan: Strengthening exercises with weights.  OT Patient Instructions: handout (scanned) Consulted and Agree with Plan of Care: Patient  GO Functional Assessment Tool Used: clinical observation/judgement Functional Limitation: Carrying, moving and handling objects Carrying, Moving and Handling Objects Current Status (P0141): At least 40 percent but less than 60 percent impaired, limited or restricted Carrying, Moving and Handling Objects Goal Status [phone]): At least 1 percent but less than 20 percent impaired, limited or restricted  Ailene Ravel, OTR/L,CBIS   01/22/2014, 4:21 PM  Physician Documentation Your signature is required to indicate approval of the treatment plan as stated above.  Please sign and either send electronically or make a copy of this report for your files and return this physician signed original.  Please mark one 1.__approve of plan  2. ___approve of plan with the following conditions.   ______________________________                                                          _____________________ Physician Signature                                                                                                             Date

## 2014-01-25 ENCOUNTER — Ambulatory Visit (HOSPITAL_COMMUNITY): Payer: Medicare Other

## 2014-01-25 ENCOUNTER — Ambulatory Visit (HOSPITAL_COMMUNITY)
Admission: RE | Admit: 2014-01-25 | Discharge: 2014-01-25 | Disposition: A | Discharge status: Home / Self Care | Payer: Medicare Other | Source: Ambulatory Visit | Attending: Family Medicine | Admitting: Family Medicine

## 2014-01-25 DIAGNOSIS — IMO0001 Reserved for inherently not codable concepts without codable children: Secondary | ICD-10-CM | POA: Diagnosis not present

## 2014-01-25 NOTE — Progress Notes (Signed)
Occupational Therapy Treatment Patient Details  Name: [name] [name] MRN: [id number] Date of Birth: Mar 13, 1941  Today's Date: 01/25/2014 Time: 1423-9532 OT Time Calculation (min): 44 min Manual 1433-1453 (20') Therapeutic Exercises 1453-1517 (24')  Visit#: 17 of 22  Re-eval: 02/12/14    Authorization: [employer]  Authorization Time Period: before 26th visit  Authorization Visit#: 17 of 26  Subjective Symptoms/Limitations Symptoms: S: "I'm going to get me some biofreeze. I love that stuff - it eases tha pain. I don't know what gave me the headache today.a' Pain Assessment Currently in Pain?: Yes Pain Score: 4  Pain Location: Shoulder Pain Orientation: Right Pain Type: Chronic pain  Exercise/Treatments Supine Protraction: PROM;10 reps;Strengthening;15 reps;Weights Protraction Weight (lbs): 1 Horizontal ABduction: PROM;10 reps;Strengthening;15 reps Horizontal ABduction Weight (lbs): 1 External Rotation: PROM;10 reps;Strengthening;15 reps External Rotation Weight (lbs): 1 Internal Rotation: PROM;10 reps;Strengthening;15 reps Internal Rotation Weight (lbs): 1 Flexion: PROM;10 reps;Strengthening;15 reps Shoulder Flexion Weight (lbs): 1 ABduction: PROM;10 reps;Strengthening;15 reps Shoulder ABduction Weight (lbs): 1 ABduction Limitations: to 90 degrees Seated Other Seated Exercises: functional reaching with 8 cones placed/removed from cabinet shelf. Placed on lowwer shelf, moved to top shelf, and back to coutnertop.      Manual Therapy Manual Therapy: Myofascial release Myofascial Release: MFR and manual stretching to right upper arm, scapular, and shoulder region to decrease pain and restrictions and increase pain free mobility  Occupational Therapy Assessment and Plan OT Assessment and Plan Clinical Impression Statement: Pt had increased fatigue with 1# strengthening this session - rest break needed.  Pt reports good use of 1# AROM for HEP OT Plan: P:  AAROM in sitting. Work on control of movements during exercises.    Goals Short Term Goals Short Term Goal 1: Paatient will be edcuated on HEP  Short Term Goal 1 Progress: Met Short Term Goal 2: Patient will improve PROM in right shoulder and elbow to [facility] for increased ability to fix her hair.  Short Term Goal 2 Progress: Progressing toward goal Short Term Goal 3: Patient will decrease right shoulder pain to 3/10 when completing functional daily  activities.  Short Term Goal 3 Progress: Met Short Term Goal 4: Patient will improve right shoulder strength to 3+/5 for increased ability to manipulate bra and improve functional mobility/safety Short Term Goal 4 Progress: Partly met Short Term Goal 5: Patient will decrease right shoulder fascial restrictions to min in her right shoulder region.  Short Term Goal 5 Progress: Met Long Term Goals Long Term Goal 1: Patient will return to prior level of independence with all B/IADLs and leisure activities. Long Term Goal 1 Progress: Progressing toward goal Long Term Goal 2: Patient will improve right shoulder strength to >/= 4/5 for increased ability to perform  Long Term Goal 2 Progress: Progressing toward goal Long Term Goal 3: Patient will improve RUE AROM to [facility] for increased ability to reach into overhead cabinets.  Long Term Goal 3 Progress: Progressing toward goal Long Term Goal 4: Patient will decrease right shoulder pain to </=1/10 when completing functional activities.  Long Term Goal 4 Progress: Progressing toward goal Long Term Goal 5: Patient will decrease  fascial restrictions to trace in right shoulder region. Long Term Goal 5 Progress: Progressing toward goal  Problem List Patient Active Problem List   Diagnosis Date Noted  . Poor balance 10/14/2013  . Repeated falls 10/14/2013  . Acute encephalopathy 07/30/2013  . Encephalopathy, hepatic 07/30/2013  . Confusion 07/29/2013  . Pancreatitis 04/26/2013  . Edema of left  lower  extremity 04/14/2013  . Liver cirrhosis secondary to NASH 04/14/2013  . Generalized weakness 02/19/2013  . Acute blood loss anemia 02/18/2013  . UTI (urinary tract infection) 02/14/2013  . Encephalopathy acute 02/14/2013  . Hyponatremia 02/14/2013  . Hypokalemia 02/14/2013  . Fracture of humeral shaft, right, closed 02/11/2013  . Multiple falls 01/26/2013  . Difficulty walking 01/26/2013  . Bilateral leg weakness 01/26/2013  . Personal history of fall 09/02/2012  . Dizziness 09/02/2012  . Frequent falls 08/08/2012  . Ataxia 08/08/2012  . Peripheral neuropathy 08/08/2012  . Preoperative clearance 06/06/2012  . 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 Anemia 04/08/2012  . Renal colic 39/76/7341  . Encephalopathy chronic 09/14/2011  . Posterior tibial tendon dysfunction 07/12/2011  . Hyperlipidemia 05/31/2011  . Essential hypertension 05/31/2011  . Uncontrolled type 2 diabetes mellitus with complication 93/79/0240  . GERD 09/29/2010  . Other chronic nonalcoholic liver disease 97/35/3299  . COLONIC POLYPS, HX OF 02/13/2010  . CIRRHOSIS 02/07/2010  . THROMBOCYTOPENIA, CHRONIC 01/04/2010  . PERIPHERAL NEUROPATHY 01/04/2010  . CAD, NATIVE VESSEL 01/04/2010  . ANXIETY DISORDER, HX OF 01/04/2010  . Intrinsic asthma, unspecified 03/16/2009  . ALLERGIC RHINITIS 02/18/2009  . DYSPNEA 02/18/2009  . DIABETES, TYPE 2 02/04/2009  . HYPERLIPIDEMIA 02/04/2009  . OBESITY 02/04/2009  . HYPERTENSION 02/04/2009    End of Session Activity Tolerance: Patient tolerated treatment well General Behavior During Therapy: [facility] for tasks assessed/performed  GO    Bea Graff, MS, OTR/L [phone]  01/25/2014, 4:13 PM

## 2014-01-27 ENCOUNTER — Ambulatory Visit (HOSPITAL_COMMUNITY): Payer: Medicare Other

## 2014-01-27 ENCOUNTER — Encounter: Payer: Self-pay | Admitting: Gastroenterology

## 2014-01-27 ENCOUNTER — Ambulatory Visit (INDEPENDENT_AMBULATORY_CARE_PROVIDER_SITE_OTHER): Payer: Medicare Other | Admitting: Gastroenterology

## 2014-01-27 ENCOUNTER — Ambulatory Visit (HOSPITAL_COMMUNITY)
Admission: RE | Admit: 2014-01-27 | Discharge: 2014-01-27 | Disposition: A | Discharge status: Home / Self Care | Payer: Medicare Other | Source: Ambulatory Visit | Attending: Family Medicine | Admitting: Family Medicine

## 2014-01-27 VITALS — BP 134/73 | HR 78 | Temp 97.6°F | Ht 68.0 in | Wt 242.8 lb

## 2014-01-27 DIAGNOSIS — K7460 Unspecified cirrhosis of liver: Secondary | ICD-10-CM

## 2014-01-27 DIAGNOSIS — IMO0001 Reserved for inherently not codable concepts without codable children: Secondary | ICD-10-CM | POA: Diagnosis not present

## 2014-01-27 LAB — CBC
HCT: 34.6 % — ABNORMAL LOW (ref 36.0–46.0)
HEMOGLOBIN: 11.4 g/dL — AB (ref 12.0–15.0)
MCH: 26.5 pg (ref 26.0–34.0)
MCHC: 32.9 g/dL (ref 30.0–36.0)
MCV: 80.5 fL (ref 78.0–100.0)
PLATELETS: 102 10*3/uL — AB (ref 150–400)
RBC: 4.3 MIL/uL (ref 3.87–5.11)
RDW: 15.6 % — ABNORMAL HIGH (ref 11.5–15.5)
WBC: 3.4 10*3/uL — ABNORMAL LOW (ref 4.0–10.5)

## 2014-01-27 NOTE — Progress Notes (Signed)
Referring [name] [name]: [name] Chessman, MD Primary Care Physician:  [name] Kilts, MD Primary GI: Dr. Gala [name]   Chief Complaint  Patient presents with  . Follow-up    HPI:   [name] [name] presents today in follow-up with a history of NASH cirrhosis, due for surveillance EGD for varices. Last EGD in Nov 2012 without esophageal varices. History of hepatic encephalopathy; unable to afford Xifaxan. Lactulose 48ml QID: having 4 soft bowel movements a day. Hep A vaccination: in process. 1/2 shots received. Up-to-date on [place] for [place]. No tumor appreciated. Due for repeat in July 2015. Denies abdominal pain. No rectal bleeding. Mild nausea, no vomiting. No mental status changes or confusion. Notes lower extremity edema, no abdominal distension. Left lower extremity usually slightly more edematous than right. Duplex of LLE on file X 2, most recent June 2014.    Past Medical History  Diagnosis Date  . CAD in native artery   . Thrombocytopenia, unspecified   . Unspecified hereditary and idiopathic peripheral neuropathy   . Unspecified fall   . Other and unspecified hyperlipidemia   . Unspecified essential hypertension   . Obesity, unspecified   . Shortness of breath   . Personal history of neurosis   . Intrinsic asthma, unspecified   . Allergic rhinitis, cause unspecified   . Cirrhosis of liver     NASH, afp on 06/17/12 =3.7, per pt she had hep B vaccines in 1996, hep A in process.   . Colon adenomas 03/08/10    tcs by Dr. Gala [name]  . Hyperplastic polyps of stomach 03/08/10    tcs by Dr. Dudley [name]  . Chronic gastritis 03/09/11    egd by Dr. Gala [name]  . History of hemorrhoids 03/08/10    tcs- internal and external  . Diverticula of colon 03/08/10    L side  . Hiatal hernia 03/08/10  . Esophagitis, erosive 03/08/10  . GERD (gastroesophageal reflux disease)   . Wears glasses   . Type II or unspecified type diabetes mellitus without mention of complication, not stated as uncontrolled   . Vertigo   .  Pancreatitis   . Bilateral leg weakness     Past Surgical History  Procedure Laterality Date  . Hysterectomy and btl      s/p  . Abdominal hysterectomy    . Tumor excision  2003    rt arm and left foot  . Colonoscopy  03/08/10    Dr. [name]-->ext/int hemorrhoids, anal paipilla, rectal polyps, desc polyps, cecal polyp, left-sided diverticula. suboptiomal prep. next TCS 02/2015. multiple adenomas  . Esophagogastroduodenoscopy  03/08/10    Dr. Chelsea [name] erosive RE, small hh, antral erosions, small 1cm are of mucosal indentation along gastric body of doubtful significance, cystic nodularity of hypophyarynx, base of tongue, base of epiglottis. benign gastric biopsies  . Tubal ligation    . Esophagogastroduodenoscopy  10/08/2011    Dr. Alda [name] esophageal varices, antral erosion. next egd 09/2013  . Foot surgery  2003    lt foot  . Eye surgery      cataracts bilateral  . Orif humerus fracture Right 02/17/2013    Procedure: OPEN REDUCTION INTERNAL FIXATION (ORIF) RIGHT PROXIMAL HUMERUS FRACTURE;  Surgeon: [name] Box, MD;  Location: [place];  Service: Orthopedics;  Laterality: Right;  . Partial gastrectomy      family denies  . Breast surgery    . Cardiac catheterization  02/25/2007    Dr. Rex [name]:  normal LV systolic function, mild irregularities of LAD  Current Outpatient Prescriptions  Medication Sig Dispense Refill  . ALPRAZolam (XANAX) 0.5 MG tablet Take 0.5 mg by mouth 3 (three) times daily as needed for anxiety.      Marland Kitchen aspirin (BAYER LOW STRENGTH) 81 MG EC tablet Take 81 mg by mouth daily.        . budesonide-formoterol (SYMBICORT) 160-4.5 MCG/ACT inhaler Inhale 2 puffs into the lungs 2 (two) times daily.        . enalapril (VASOTEC) 5 MG tablet Take 5 mg by mouth daily.        Marland Kitchen esomeprazole (NEXIUM) 40 MG capsule Take 40 mg by mouth every morning.      Marland Kitchen ESTRACE VAGINAL 0.1 MG/GM vaginal cream Place vaginally every other day. At  bedtime      . GENERLAC 10 GM/15ML SOLN Take 10  g by mouth 3 (three) times daily.       . insulin aspart (NOVOLOG) 100 UNIT/ML injection Inject 10-14 Units into the skin daily as needed (90-150= 10 units 151-200= 11 units 201-250= 12 units 250-300= 13 units 301-350= 14 units as directed per sliding scale instructions). Per sliding scale. Patient injects 10-40 units for levels above 150 to 300      . insulin detemir (LEVEMIR) 100 UNIT/ML injection Inject 50 Units into the skin at bedtime.       . meclizine (ANTIVERT) 25 MG tablet Take 25 mg by mouth 3 (three) times daily as needed for dizziness or nausea.       . Menthol, Topical Analgesic, (BIOFREEZE) 4 % GEL Apply 1 application topically daily as needed (for arm pain).      . Multiple Vitamins-Minerals (CENTRUM SILVER PO) Take 1 tablet by mouth daily.       Marland Kitchen oxyCODONE (OXY IR/ROXICODONE) 5 MG immediate release tablet Take 5 mg by mouth daily as needed for moderate pain, severe pain or breakthrough pain.       . pregabalin (LYRICA) 100 MG capsule Take 100-200 mg by mouth daily.       . quinapril (ACCUPRIL) 5 MG tablet Take 5 mg by mouth daily.      Marland Kitchen triamcinolone cream (KENALOG) 0.1 % Apply 1 application topically daily as needed (for irritation).       . ZOSTAVAX 31540 UNT/0.65ML injection once.      Marland Kitchen amoxicillin-clavulanate (AUGMENTIN) 875-125 MG per tablet Take 1 tablet by mouth every 12 (twelve) hours.  5 tablet  0   No current facility-administered medications for this visit.    Allergies as of 01/27/2014 - Review Complete 01/27/2014  Allergen Reaction Noted  . Morphine and related Itching 04/26/2013  . Prochlorperazine edisylate Other (See Comments)   . Compazine [prochlorperazine edisylate] Anxiety 06/04/2012    Family History  Problem Relation Age of Onset  . Coronary artery disease      FH  . Diabetes      FH  . Heart disease Mother   . Colon cancer Neg Hx     History   Social History  . Marital Status: Married    Spouse Name: N/A    Number of Children: 3  .  Years of Education: N/A   Social History Main Topics  . Smoking status: Never Smoker   . Smokeless tobacco: None  . Alcohol Use: No  . Drug Use: No  . Sexual Activity: Not Currently   Other Topics Concern  . None   Social History Narrative  . None    Review of Systems: Negative unless  mentioned in HPI.   Physical Exam: BP 134/73  Pulse 78  Temp(Src) 97.6 F (36.4 C) (Oral)  Ht 5\' 8"  (1.727 m)  Wt 242 lb 12.8 oz (110.133 kg)  BMI 36.93 kg/m2 General:   Alert and oriented. No distress noted. Pleasant and cooperative.  Head:  Normocephalic and atraumatic. Eyes:  Conjuctiva clear without scleral icterus. Mouth:  Oral mucosa pink and moist.  Heart:  S1, S2 present without murmurs, rubs, or gallops. Regular rate and rhythm. Abdomen:  +BS, soft, non-tender and non-distended. Obese. Limited exam as patient is sitting in wheelchair. No rebound or guarding.  Msk:  Symmetrical without gross deformities. Normal posture. Extremities:  1-2+ pitting edema bilateral lower extremities, LLE slightly more ankle edema than right. Negative Homan's sign. Chronic LLE edema Neurologic:  Alert and  oriented x4;  grossly normal neurologically. Skin:  Intact without significant lesions or rashes. Psych:  Alert and cooperative. Normal mood and affect.

## 2014-01-27 NOTE — Progress Notes (Signed)
Occupational Therapy Treatment Patient Details  Name: [name] [name] MRN: [id number] Date of Birth: Jun 27, 1941  Today's Date: 01/27/2014 Time: 1034-1100 OT Time Calculation (min): 26 min Manual 1034-1045 (11') Therapeutic Exercises 1045-1100 (15')  Visit#: 18 of 22  Re-eval: 02/12/14    Authorization: [employer] Medicare  Authorization Time Period: before 26th visit  Authorization Visit#: 18 of 26  Subjective Symptoms/Limitations Symptoms: S: "My granddaughter got me some biofreeze - it works great." Pain Assessment Currently in Pain?: Yes Pain Score: 3  Pain Location: Shoulder Pain Orientation: Right Pain Type: Chronic pain   Exercise/Treatments Supine Protraction: PROM;10 reps Horizontal ABduction: PROM;10 reps External Rotation: PROM;10 reps Internal Rotation: PROM;10 reps Flexion: PROM;10 reps ABduction: PROM;10 reps Seated Protraction: AAROM;10 reps Horizontal ABduction: AAROM;10 reps External Rotation: AAROM;10 reps Internal Rotation: AAROM;10 reps Flexion: AAROM;10 reps Abduction: AAROM;10 reps ROM / Strengthening / Isometric Strengthening Proximal Shoulder Strengthening, Supine: 10x, x2reps all directions. max vc for proper form and technique      Manual Therapy Manual Therapy: Myofascial release Myofascial Release: MFR and manual stretching to right upper arm, scapular, and shoulder region to decrease pain and restrictions and increase pain free mobility.    Occupational Therapy Assessment and Plan OT Assessment and Plan Clinical Impression Statement: Short session due to pt arriving late. She did well with seated AAROM, requring cueing for depressing shoulder. OT Plan: P: AAROM in sitting. Work on control of movements during exercises.    Goals Short Term Goals Short Term Goal 1: Paatient will be edcuated on HEP  Short Term Goal 1 Progress: Met Short Term Goal 2: Patient will improve PROM in right shoulder and elbow to [facility] for increased  ability to fix her hair.  Short Term Goal 2 Progress: Progressing toward goal Short Term Goal 3: Patient will decrease right shoulder pain to 3/10 when completing functional daily  activities.  Short Term Goal 3 Progress: Met Short Term Goal 4: Patient will improve right shoulder strength to 3+/5 for increased ability to manipulate bra and improve functional mobility/safety Short Term Goal 4 Progress: Partly met Short Term Goal 5: Patient will decrease right shoulder fascial restrictions to min in her right shoulder region.  Short Term Goal 5 Progress: Met Long Term Goals Long Term Goal 1: Patient will return to prior level of independence with all B/IADLs and leisure activities. Long Term Goal 1 Progress: Progressing toward goal Long Term Goal 2: Patient will improve right shoulder strength to >/= 4/5 for increased ability to perform  Long Term Goal 2 Progress: Progressing toward goal Long Term Goal 3: Patient will improve RUE AROM to [facility] for increased ability to reach into overhead cabinets.  Long Term Goal 3 Progress: Progressing toward goal Long Term Goal 4: Patient will decrease right shoulder pain to </=1/10 when completing functional activities.  Long Term Goal 4 Progress: Progressing toward goal Long Term Goal 5: Patient will decrease  fascial restrictions to trace in right shoulder region. Long Term Goal 5 Progress: Progressing toward goal  Problem List Patient Active Problem List   Diagnosis Date Noted  . Poor balance 10/14/2013  . Repeated falls 10/14/2013  . Acute encephalopathy 07/30/2013  . Encephalopathy, hepatic 07/30/2013  . Confusion 07/29/2013  . Pancreatitis 04/26/2013  . Edema of left lower extremity 04/14/2013  . Liver cirrhosis secondary to NASH 04/14/2013  . Generalized weakness 02/19/2013  . Acute blood loss anemia 02/18/2013  . UTI (urinary tract infection) 02/14/2013  . Encephalopathy acute 02/14/2013  . Hyponatremia 02/14/2013  .  Hypokalemia 02/14/2013   . Fracture of humeral shaft, right, closed 02/11/2013  . Multiple falls 01/26/2013  . Difficulty walking 01/26/2013  . Bilateral leg weakness 01/26/2013  . Personal history of fall 09/02/2012  . Dizziness 09/02/2012  . Frequent falls 08/08/2012  . Ataxia 08/08/2012  . Peripheral neuropathy 08/08/2012  . Preoperative clearance 06/06/2012  . 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 Anemia 04/08/2012  . Renal colic 83/35/8251  . Encephalopathy chronic 09/14/2011  . Posterior tibial tendon dysfunction 07/12/2011  . Hyperlipidemia 05/31/2011  . Essential hypertension 05/31/2011  . Uncontrolled type 2 diabetes mellitus with complication 89/84/2103  . GERD 09/29/2010  . Other chronic nonalcoholic liver disease 12/81/1886  . COLONIC POLYPS, HX OF 02/13/2010  . CIRRHOSIS 02/07/2010  . THROMBOCYTOPENIA, CHRONIC 01/04/2010  . PERIPHERAL NEUROPATHY 01/04/2010  . CAD, NATIVE VESSEL 01/04/2010  . ANXIETY DISORDER, HX OF 01/04/2010  . Intrinsic asthma, unspecified 03/16/2009  . ALLERGIC RHINITIS 02/18/2009  . DYSPNEA 02/18/2009  . DIABETES, TYPE 2 02/04/2009  . HYPERLIPIDEMIA 02/04/2009  . OBESITY 02/04/2009  . HYPERTENSION 02/04/2009    End of Session Activity Tolerance: Patient tolerated treatment well General Behavior During Therapy: [facility] for tasks assessed/performed  GO    Bea Graff, MS, OTR/L [phone]  01/27/2014, 12:02 PM

## 2014-01-27 NOTE — Assessment & Plan Note (Addendum)
73 year old female with NASH cirrhosis, due for variceal screening now. Completed 1/2 for Hep A vaccination; last dose due in a few months. Doing well on Lactulose; needs to follow a stricter 2g Na diet. Noted lower extremity mild edema, with left greater than right, which is chronic. Negative for DVT X 2 in the past. Likely patient's baseline. Obtain routine labs to include renal function; anticipate starting on low-dose combo of Lasix 20 mg and Aldactone 50 mg daily.   Proceed with upper endoscopy in the near future with Dr. Gala Romney. The risks, benefits, and alternatives have been discussed in detail with patient. They have stated understanding and desire to proceed.  CBC, INR, CMP today Next [place] of abdomen in July 2015 Surveillance colonoscopy 2016

## 2014-01-27 NOTE — Patient Instructions (Signed)
We have scheduled you for an upper endoscopy with Dr. Gala Romney in the near future.   Please have blood work done today. After this is reviewed, I would like to start you on some low dose diuretics.  Further recommendations to follow!

## 2014-01-28 ENCOUNTER — Other Ambulatory Visit: Payer: Self-pay | Admitting: Gastroenterology

## 2014-01-28 LAB — COMPREHENSIVE METABOLIC PANEL
ALBUMIN: 3.3 g/dL — AB (ref 3.5–5.2)
ALK PHOS: 91 U/L (ref 39–117)
ALT: 11 U/L (ref 0–35)
AST: 15 U/L (ref 0–37)
BUN: 9 mg/dL (ref 6–23)
CO2: 26 mEq/L (ref 19–32)
Calcium: 8 mg/dL — ABNORMAL LOW (ref 8.4–10.5)
Chloride: 107 mEq/L (ref 96–112)
Creat: 0.67 mg/dL (ref 0.50–1.10)
GLUCOSE: 330 mg/dL — AB (ref 70–99)
POTASSIUM: 3.8 meq/L (ref 3.5–5.3)
Sodium: 141 mEq/L (ref 135–145)
TOTAL PROTEIN: 5.1 g/dL — AB (ref 6.0–8.3)
Total Bilirubin: 1.8 mg/dL — ABNORMAL HIGH (ref 0.2–1.2)

## 2014-01-28 LAB — PROTIME-INR
INR: 1.15 (ref <1.50)
PROTHROMBIN TIME: 14.6 s (ref 11.6–15.2)

## 2014-01-28 MED ORDER — SPIRONOLACTONE 50 MG PO TABS: 50.0000 mg | ORAL_TABLET | Freq: Every day | ORAL | Status: DC

## 2014-01-28 MED ORDER — FUROSEMIDE 20 MG PO TABS: 20.0000 mg | ORAL_TABLET | Freq: Every day | ORAL | Status: DC

## 2014-01-28 NOTE — Progress Notes (Signed)
Quick Note:  MELD 10.  Glucose too high. Needs stricter control of diabetes.  Start Lasix 20 mg and Aldactone 50 mg daily, each morning. I am sending to the pharmacy.  Recheck BMP in about 7-10 days. I want to make sure she is tolerating it. ______

## 2014-01-28 NOTE — Progress Notes (Signed)
CC'd to pcp 

## 2014-01-29 ENCOUNTER — Ambulatory Visit (HOSPITAL_COMMUNITY)
Admission: RE | Admit: 2014-01-29 | Discharge: 2014-01-29 | Disposition: A | Discharge status: Home / Self Care | Payer: Medicare Other | Source: Ambulatory Visit | Attending: Family Medicine | Admitting: Family Medicine

## 2014-01-29 ENCOUNTER — Ambulatory Visit (HOSPITAL_COMMUNITY): Payer: Medicare Other

## 2014-01-29 ENCOUNTER — Other Ambulatory Visit: Payer: Self-pay

## 2014-01-29 DIAGNOSIS — K7460 Unspecified cirrhosis of liver: Secondary | ICD-10-CM

## 2014-01-29 DIAGNOSIS — IMO0001 Reserved for inherently not codable concepts without codable children: Secondary | ICD-10-CM | POA: Diagnosis not present

## 2014-01-29 NOTE — Progress Notes (Signed)
Quick Note:  Called and informed pt. Lab order faxed to [facility] ______

## 2014-01-29 NOTE — Progress Notes (Signed)
Occupational Therapy Treatment Patient Details  Name: [name] [name] MRN: [id number] Date of Birth: Apr 22, 1941  Today's Date: 01/29/2014 Time: 9509-3267 OT Time Calculation (min): 39 min Manual 1438-1450 (12') Therapeutic Exercises 1245-8099 (85')  Visit#: 19 of 22  Re-eval: 02/12/14    Authorization: [employer]  Authorization Time Period: before 26th visit  Authorization Visit#: 19 of 26  Subjective Symptoms/Limitations Symptoms: S: "No pain since I've got ath biofreeze." Pain Assessment Currently in Pain?: No/denies  Exercise/Treatments Supine Protraction: PROM;10 reps;Strengthening;15 reps Protraction Weight (lbs): 1 Horizontal ABduction: PROM;10 reps;Strengthening;15 reps Horizontal ABduction Weight (lbs): 1 External Rotation: PROM;10 reps;Strengthening;15 reps External Rotation Weight (lbs): 1 Internal Rotation: PROM;10 reps;Strengthening;15 reps Internal Rotation Weight (lbs): 1 Flexion: PROM;10 reps;Strengthening;15 reps Shoulder Flexion Weight (lbs): 1 ABduction: PROM;10 reps;Strengthening;15 reps Shoulder ABduction Weight (lbs): 1 Seated Protraction: AAROM;12 reps Horizontal ABduction: AAROM;12 reps External Rotation: AAROM;12 reps Internal Rotation: AAROM;12 reps Flexion: AAROM;12 reps Abduction: AAROM;12 reps ROM / Strengthening / Isometric Strengthening X to V Arms: 12 reps, min facilitation for elbow extension and depressing shoulder   Proximal Shoulder Strengthening, Supine: 15x, x2reps all directions. max vc for proper form and technique. 10 reps each was circles. with 1# weight Prot/Ret//Elev/Dep: 6 reps (pt got slightly dizzy - sat and had water, dizziness resolve)      Manual Therapy Manual Therapy: Myofascial release Myofascial Release: MFR and manual stretching to right upper arm, scapular, and shoulder region to decrease pain and restrictions and increase pain free mobility  Occupational Therapy Assessment and Plan OT  Assessment and Plan Clinical Impression Statement: Increased sounds of scar tissue breaking up this session. Pt has some increased pain as a result.  Tolerated well all exercises. OT Plan: P: Continue AAROM in sitting. Work on control of movements during exercises, proximal strengthening.   Goals Short Term Goals Short Term Goal 1: Paatient will be edcuated on HEP  Short Term Goal 1 Progress: Met Short Term Goal 2: Patient will improve PROM in right shoulder and elbow to [facility] for increased ability to fix her hair.  Short Term Goal 2 Progress: Progressing toward goal Short Term Goal 3: Patient will decrease right shoulder pain to 3/10 when completing functional daily  activities.  Short Term Goal 3 Progress: Met Short Term Goal 4: Patient will improve right shoulder strength to 3+/5 for increased ability to manipulate bra and improve functional mobility/safety Short Term Goal 4 Progress: Partly met Short Term Goal 5: Patient will decrease right shoulder fascial restrictions to min in her right shoulder region.  Short Term Goal 5 Progress: Met Long Term Goals Long Term Goal 1: Patient will return to prior level of independence with all B/IADLs and leisure activities. Long Term Goal 1 Progress: Progressing toward goal Long Term Goal 2: Patient will improve right shoulder strength to >/= 4/5 for increased ability to perform  Long Term Goal 2 Progress: Progressing toward goal Long Term Goal 3: Patient will improve RUE AROM to [facility] for increased ability to reach into overhead cabinets.  Long Term Goal 3 Progress: Progressing toward goal Long Term Goal 4: Patient will decrease right shoulder pain to </=1/10 when completing functional activities.  Long Term Goal 4 Progress: Progressing toward goal Long Term Goal 5: Patient will decrease  fascial restrictions to trace in right shoulder region. Long Term Goal 5 Progress: Progressing toward goal  Problem List Patient Active Problem List   Diagnosis  Date Noted  . Poor balance 10/14/2013  . Repeated falls 10/14/2013  .  Acute encephalopathy 07/30/2013  . Encephalopathy, hepatic 07/30/2013  . Confusion 07/29/2013  . Pancreatitis 04/26/2013  . Edema of left lower extremity 04/14/2013  . Liver cirrhosis secondary to NASH 04/14/2013  . Generalized weakness 02/19/2013  . Acute blood loss anemia 02/18/2013  . UTI (urinary tract infection) 02/14/2013  . Encephalopathy acute 02/14/2013  . Hyponatremia 02/14/2013  . Hypokalemia 02/14/2013  . Fracture of humeral shaft, right, closed 02/11/2013  . Multiple falls 01/26/2013  . Difficulty walking 01/26/2013  . Bilateral leg weakness 01/26/2013  . Personal history of fall 09/02/2012  . Dizziness 09/02/2012  . Frequent falls 08/08/2012  . Ataxia 08/08/2012  . Peripheral neuropathy 08/08/2012  . Preoperative clearance 06/06/2012  . 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 Anemia 04/08/2012  . Renal colic 56/43/3295  . Encephalopathy chronic 09/14/2011  . Posterior tibial tendon dysfunction 07/12/2011  . Hyperlipidemia 05/31/2011  . Essential hypertension 05/31/2011  . Uncontrolled type 2 diabetes mellitus with complication 18/84/1660  . GERD 09/29/2010  . Other chronic nonalcoholic liver disease 63/11/6008  . COLONIC POLYPS, HX OF 02/13/2010  . CIRRHOSIS 02/07/2010  . THROMBOCYTOPENIA, CHRONIC 01/04/2010  . PERIPHERAL NEUROPATHY 01/04/2010  . CAD, NATIVE VESSEL 01/04/2010  . ANXIETY DISORDER, HX OF 01/04/2010  . Intrinsic asthma, unspecified 03/16/2009  . ALLERGIC RHINITIS 02/18/2009  . DYSPNEA 02/18/2009  . DIABETES, TYPE 2 02/04/2009  . HYPERLIPIDEMIA 02/04/2009  . OBESITY 02/04/2009  . HYPERTENSION 02/04/2009    End of Session Activity Tolerance: Patient tolerated treatment well General Behavior During Therapy: [facility] for tasks assessed/performed  GO    Bea Graff, MS, OTR/L [phone]  01/29/2014, 4:19 PM

## 2014-02-02 ENCOUNTER — Ambulatory Visit (HOSPITAL_COMMUNITY)
Admission: RE | Admit: 2014-02-02 | Discharge: 2014-02-02 | Disposition: A | Discharge status: Home / Self Care | Payer: Medicare Other | Source: Ambulatory Visit | Attending: Family Medicine | Admitting: Family Medicine

## 2014-02-02 ENCOUNTER — Encounter (HOSPITAL_COMMUNITY): Payer: Self-pay | Admitting: Pharmacy Technician

## 2014-02-02 DIAGNOSIS — IMO0001 Reserved for inherently not codable concepts without codable children: Secondary | ICD-10-CM | POA: Diagnosis not present

## 2014-02-02 NOTE — Progress Notes (Signed)
Occupational Therapy Treatment Patient Details  Name: [name] [name] MRN: [id number] Date of Birth: 06/28/1941  Today's Date: 02/02/2014 Time: 3086-5784 OT Time Calculation (min): 38 min MFR 6962-9528 15' Therex 4132-4401 23'  Visit#: 20 of 22  Re-eval: 02/12/14    Authorization: [employer]  Authorization Time Period: before 26th visit  Authorization Visit#: 20 of 26  Subjective Symptoms/Limitations Symptoms: S: My shoulder is sore today. Pain Assessment Currently in Pain?: Yes Pain Score: 4  Pain Location: Shoulder Pain Orientation: Right Pain Type: Chronic pain  Precautions/Restrictions  Precautions Precautions: Fall  Exercise/Treatments Supine Protraction: PROM;10 reps;Strengthening;15 reps Protraction Weight (lbs): 1 Horizontal ABduction: PROM;10 reps;Strengthening;15 reps Horizontal ABduction Weight (lbs): 1 External Rotation: PROM;10 reps;Strengthening;15 reps External Rotation Weight (lbs): 1 Internal Rotation: PROM;10 reps;Strengthening;15 reps Internal Rotation Weight (lbs): 1 Flexion: PROM;10 reps;Strengthening;15 reps Shoulder Flexion Weight (lbs): 1 ABduction: PROM;10 reps;Strengthening;15 reps Shoulder ABduction Weight (lbs): 1 ABduction Limitations: to 90 degrees Seated Protraction: AAROM;12 reps Horizontal ABduction: AAROM;12 reps External Rotation: AAROM;12 reps Internal Rotation: AAROM;12 reps Flexion: AAROM;12 reps Abduction: AAROM;12 reps ROM / Strengthening / Isometric Strengthening X to V Arms: 12 reps, min facilitation for elbow extension and depressing shoulder   Proximal Shoulder Strengthening, Supine: 10x 1# vc's and tactile cues for proper form and technique       Manual Therapy Manual Therapy: Myofascial release Myofascial Release: MFR and manual stretching to right upper arm, scapular, and shoulder region to decrease pain and restrictions and increase pain free mobility  Occupational Therapy Assessment and  Plan OT Assessment and Plan Clinical Impression Statement: A: Patient required increased assistance this date with controlling movements and keeping proper form for exercises. Added  AAROM seated. Patient tolerated well.  OT Plan: P: Continue to work on control of movements during exercises, proximal strengthening.   Goals Short Term Goals Short Term Goal 1: Paatient will be edcuated on HEP  Short Term Goal 2: Patient will improve PROM in right shoulder and elbow to [facility] for increased ability to fix her hair.  Short Term Goal 2 Progress: Progressing toward goal Short Term Goal 3: Patient will decrease right shoulder pain to 3/10 when completing functional daily  activities.  Short Term Goal 4: Patient will improve right shoulder strength to 3+/5 for increased ability to manipulate bra and improve functional mobility/safety Short Term Goal 5: Patient will decrease right shoulder fascial restrictions to min in her right shoulder region.  Long Term Goals Long Term Goal 1: Patient will return to prior level of independence with all B/IADLs and leisure activities. Long Term Goal 1 Progress: Progressing toward goal Long Term Goal 2: Patient will improve right shoulder strength to >/= 4/5 for increased ability to perform  Long Term Goal 2 Progress: Progressing toward goal Long Term Goal 3: Patient will improve RUE AROM to [facility] for increased ability to reach into overhead cabinets.  Long Term Goal 3 Progress: Progressing toward goal Long Term Goal 4: Patient will decrease right shoulder pain to </=1/10 when completing functional activities.  Long Term Goal 4 Progress: Progressing toward goal Long Term Goal 5: Patient will decrease  fascial restrictions to trace in right shoulder region. Long Term Goal 5 Progress: Progressing toward goal  Problem List Patient Active Problem List   Diagnosis Date Noted  . Poor balance 10/14/2013  . Repeated falls 10/14/2013  . Acute encephalopathy 07/30/2013  .  Encephalopathy, hepatic 07/30/2013  . Confusion 07/29/2013  . Pancreatitis 04/26/2013  . Edema of left lower extremity 04/14/2013  .  Liver cirrhosis secondary to NASH 04/14/2013  . Generalized weakness 02/19/2013  . Acute blood loss anemia 02/18/2013  . UTI (urinary tract infection) 02/14/2013  . Encephalopathy acute 02/14/2013  . Hyponatremia 02/14/2013  . Hypokalemia 02/14/2013  . Fracture of humeral shaft, right, closed 02/11/2013  . Multiple falls 01/26/2013  . Difficulty walking 01/26/2013  . Bilateral leg weakness 01/26/2013  . Personal history of fall 09/02/2012  . Dizziness 09/02/2012  . Frequent falls 08/08/2012  . Ataxia 08/08/2012  . Peripheral neuropathy 08/08/2012  . Preoperative clearance 06/06/2012  . 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 Anemia 04/08/2012  . Renal colic 78/29/5621  . Encephalopathy chronic 09/14/2011  . Posterior tibial tendon dysfunction 07/12/2011  . Hyperlipidemia 05/31/2011  . Essential hypertension 05/31/2011  . Uncontrolled type 2 diabetes mellitus with complication 30/86/5784  . GERD 09/29/2010  . Other chronic nonalcoholic liver disease 69/62/9528  . COLONIC POLYPS, HX OF 02/13/2010  . CIRRHOSIS 02/07/2010  . THROMBOCYTOPENIA, CHRONIC 01/04/2010  . PERIPHERAL NEUROPATHY 01/04/2010  . CAD, NATIVE VESSEL 01/04/2010  . ANXIETY DISORDER, HX OF 01/04/2010  . Intrinsic asthma, unspecified 03/16/2009  . ALLERGIC RHINITIS 02/18/2009  . DYSPNEA 02/18/2009  . DIABETES, TYPE 2 02/04/2009  . HYPERLIPIDEMIA 02/04/2009  . OBESITY 02/04/2009  . HYPERTENSION 02/04/2009    End of Session Activity Tolerance: Patient tolerated treatment well General Behavior During Therapy: [facility] for tasks assessed/performed   Ailene Ravel, OTR/L,CBIS   02/02/2014, 3:14 PM

## 2014-02-04 ENCOUNTER — Ambulatory Visit (HOSPITAL_COMMUNITY)
Admission: RE | Admit: 2014-02-04 | Discharge: 2014-02-04 | Disposition: A | Discharge status: Home / Self Care | Payer: Medicare Other | Source: Ambulatory Visit | Attending: Family Medicine | Admitting: Family Medicine

## 2014-02-04 DIAGNOSIS — IMO0001 Reserved for inherently not codable concepts without codable children: Secondary | ICD-10-CM | POA: Diagnosis not present

## 2014-02-04 NOTE — Progress Notes (Signed)
Occupational Therapy Treatment Patient Details  Name: [name] [name] MRN: [id number] Date of Birth: 07/10/1941  Today's Date: 02/04/2014 Time: 1352-1430 OT Time Calculation (min): 38 min MFR 1352-1400  8' Therex 1400-1430 30'  Visit#: 21 of 24  Re-eval:      Authorization: [employer] Medicare  Authorization Time Period: before 26th visit  Authorization Visit#: 21 of 26  Subjective Symptoms/Limitations Symptoms: S: I'm tired today. I see the doctor for my shoulder again in April. Pain Assessment Currently in Pain?: Yes Pain Score: 4  Pain Location: Shoulder Pain Orientation: Right Pain Type: Chronic pain  Precautions/Restrictions  Precautions Precautions: Fall  Exercise/Treatments Supine Protraction: PROM;Strengthening;10 reps Protraction Weight (lbs): 2 Horizontal ABduction: PROM;10 reps;Strengthening;15 reps Horizontal ABduction Weight (lbs): 1 External Rotation: PROM;10 reps;Strengthening;15 reps External Rotation Weight (lbs): 1 Internal Rotation: PROM;10 reps;Strengthening;15 reps Internal Rotation Weight (lbs): 1 Flexion: PROM;10 reps;Strengthening;15 reps Shoulder Flexion Weight (lbs): 1 ABduction: PROM;10 reps;Strengthening;15 reps Shoulder ABduction Weight (lbs): 1 ROM / Strengthening / Isometric Strengthening UBE (Upper Arm Bike): 1.0, 3' forward 3' reverse X to V Arms: 12 reps, mod facilitation for elbow extension and depressing shoulder   Proximal Shoulder Strengthening, Supine: 10x 1# vc's and tactile cues for proper form and technique       Manual Therapy Manual Therapy: Myofascial release Myofascial Release: MFR and manual stretching to right upper arm, scapular, and shoulder region to decrease pain and restrictions and increase pain free mobility  Occupational Therapy Assessment and Plan OT Assessment and Plan Clinical Impression Statement: A: patient continues to require increased physical assistance to control movements during seated  X to V arms. Patient also having difficulty with controlling movement during proximal shoulder strengthening where she shows the greatest weakness. Added UBE bike for shoulder strengthening and endurance   OT Plan: P: Cont to work on controling movements during exercises, proximal shoulder strengthening.   Goals Short Term Goals Short Term Goal 1: Paatient will be edcuated on HEP  Short Term Goal 2: Patient will improve PROM in right shoulder and elbow to [facility] for increased ability to fix her hair.  Short Term Goal 2 Progress: Progressing toward goal Short Term Goal 3: Patient will decrease right shoulder pain to 3/10 when completing functional daily  activities.  Short Term Goal 4: Patient will improve right shoulder strength to 3+/5 for increased ability to manipulate bra and improve functional mobility/safety Short Term Goal 5: Patient will decrease right shoulder fascial restrictions to min in her right shoulder region.  Long Term Goals Long Term Goal 1: Patient will return to prior level of independence with all B/IADLs and leisure activities. Long Term Goal 1 Progress: Progressing toward goal Long Term Goal 2: Patient will improve right shoulder strength to >/= 4/5 for increased ability to perform  Long Term Goal 2 Progress: Progressing toward goal Long Term Goal 3: Patient will improve RUE AROM to [facility] for increased ability to reach into overhead cabinets.  Long Term Goal 3 Progress: Progressing toward goal Long Term Goal 4: Patient will decrease right shoulder pain to </=1/10 when completing functional activities.  Long Term Goal 4 Progress: Progressing toward goal Long Term Goal 5: Patient will decrease  fascial restrictions to trace in right shoulder region. Long Term Goal 5 Progress: Progressing toward goal  Problem List Patient Active Problem List   Diagnosis Date Noted  . Poor balance 10/14/2013  . Repeated falls 10/14/2013  . Acute encephalopathy 07/30/2013  . Encephalopathy,  hepatic 07/30/2013  . Confusion 07/29/2013  .  Pancreatitis 04/26/2013  . Edema of left lower extremity 04/14/2013  . Liver cirrhosis secondary to NASH 04/14/2013  . Generalized weakness 02/19/2013  . Acute blood loss anemia 02/18/2013  . UTI (urinary tract infection) 02/14/2013  . Encephalopathy acute 02/14/2013  . Hyponatremia 02/14/2013  . Hypokalemia 02/14/2013  . Fracture of humeral shaft, right, closed 02/11/2013  . Multiple falls 01/26/2013  . Difficulty walking 01/26/2013  . Bilateral leg weakness 01/26/2013  . Personal history of fall 09/02/2012  . Dizziness 09/02/2012  . Frequent falls 08/08/2012  . Ataxia 08/08/2012  . Peripheral neuropathy 08/08/2012  . Preoperative clearance 06/06/2012  . 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 Anemia 04/08/2012  . Renal colic 85/27/7824  . Encephalopathy chronic 09/14/2011  . Posterior tibial tendon dysfunction 07/12/2011  . Hyperlipidemia 05/31/2011  . Essential hypertension 05/31/2011  . Uncontrolled type 2 diabetes mellitus with complication 23/53/6144  . GERD 09/29/2010  . Other chronic nonalcoholic liver disease 31/54/0086  . COLONIC POLYPS, HX OF 02/13/2010  . CIRRHOSIS 02/07/2010  . THROMBOCYTOPENIA, CHRONIC 01/04/2010  . PERIPHERAL NEUROPATHY 01/04/2010  . CAD, NATIVE VESSEL 01/04/2010  . ANXIETY DISORDER, HX OF 01/04/2010  . Intrinsic asthma, unspecified 03/16/2009  . ALLERGIC RHINITIS 02/18/2009  . DYSPNEA 02/18/2009  . DIABETES, TYPE 2 02/04/2009  . HYPERLIPIDEMIA 02/04/2009  . OBESITY 02/04/2009  . HYPERTENSION 02/04/2009    End of Session Activity Tolerance: Patient tolerated treatment well General Behavior During Therapy: [facility] for tasks assessed/performed   Ailene Ravel, OTR/L,CBIS   02/04/2014, 2:36 PM

## 2014-02-06 LAB — BASIC METABOLIC PANEL
BUN: 11 mg/dL (ref 6–23)
CO2: 26 meq/L (ref 19–32)
Calcium: 8.4 mg/dL (ref 8.4–10.5)
Chloride: 107 mEq/L (ref 96–112)
Creat: 0.67 mg/dL (ref 0.50–1.10)
Glucose, Bld: 229 mg/dL — ABNORMAL HIGH (ref 70–99)
POTASSIUM: 3.5 meq/L (ref 3.5–5.3)
SODIUM: 140 meq/L (ref 135–145)

## 2014-02-08 ENCOUNTER — Ambulatory Visit (HOSPITAL_COMMUNITY)
Admission: RE | Admit: 2014-02-08 | Discharge: 2014-02-08 | Disposition: A | Discharge status: Home / Self Care | Payer: Medicare Other | Source: Ambulatory Visit | Attending: Family Medicine | Admitting: Family Medicine

## 2014-02-08 DIAGNOSIS — IMO0001 Reserved for inherently not codable concepts without codable children: Secondary | ICD-10-CM | POA: Diagnosis not present

## 2014-02-08 NOTE — Progress Notes (Signed)
Occupational Therapy Treatment Patient Details  Name: [name] [name] MRN: [id number] Date of Birth: [birth year]  Today's Date: 02/08/2014 Time: 1478-2956 OT Time Calculation (min): 39 min MFR 2130-8657 10' Therex 8469-6295 29'  Visit#: 22 of 24  Re-eval: 02/12/14    Authorization: [employer] Medicare  Authorization Time Period: before 26th visit  Authorization Visit#: 22 of 26  Subjective Symptoms/Limitations Symptoms: S: My shoulder isn't bad today. Only sore a little bit.  Pain Assessment Currently in Pain?: Yes Pain Score: 2  Pain Location: Shoulder Pain Orientation: Right Pain Type: Acute pain  Precautions/Restrictions  Precautions Precautions: Fall  Exercise/Treatments Supine Protraction: PROM;10 reps;Strengthening;15 reps Protraction Weight (lbs): 1 Horizontal ABduction: PROM;10 reps;Strengthening;15 reps Horizontal ABduction Weight (lbs): 1 External Rotation: PROM;10 reps;Strengthening;15 reps External Rotation Weight (lbs): 1 Internal Rotation: PROM;10 reps;Strengthening;15 reps Internal Rotation Weight (lbs): 1 Flexion: PROM;10 reps;Strengthening;15 reps Shoulder Flexion Weight (lbs): 1 ABduction: PROM;10 reps;Strengthening;15 reps Shoulder ABduction Weight (lbs): 1 Seated Protraction: Strengthening;10 reps;Weights Protraction Weight (lbs): 1 Horizontal ABduction: AROM;10 reps;Limitations Horizontal ABduction Limitations: Assistance to keep elbow straight and use shoulder for movement. Physical assist to depress shoulder. External Rotation: Strengthening;10 reps;Weights External Rotation Weight (lbs): 1 Internal Rotation: Strengthening;10 reps;Weights Internal Rotation Weight (lbs): 1 Flexion: Strengthening;10 reps;Weights Flexion Weight (lbs): 1 Abduction: AROM;10 reps;Limitations ABduction Limitations: Physical assistance to complete movement. to about 90 degrees. ROM / Strengthening / Isometric Strengthening UBE (Upper Arm Bike): 1.0, 3'  forward 3' reverse        Manual Therapy Manual Therapy: Myofascial release Myofascial Release: MFR and manual stretching to right upper arm, scapular, and shoulder region to decrease pain and restrictions and increase pain free mobility  Occupational Therapy Assessment and Plan OT Assessment and Plan Clinical Impression Statement: A: Patient has a lot of crepitation and "crunching" during active and passive shoulder movements. Patient continues to experience with completing shoulder movements without substitution from elbow.  OT Plan: P: Cont to work on controling movements during exercises, proximal shoulder strengthening. 2 more visits before discharge.    Goals Short Term Goals Short Term Goal 1: Paatient will be edcuated on HEP  Short Term Goal 2: Patient will improve PROM in right shoulder and elbow to [facility] for increased ability to fix her hair.  Short Term Goal 2 Progress: Progressing toward goal Short Term Goal 3: Patient will decrease right shoulder pain to 3/10 when completing functional daily  activities.  Short Term Goal 4: Patient will improve right shoulder strength to 3+/5 for increased ability to manipulate bra and improve functional mobility/safety Short Term Goal 5: Patient will decrease right shoulder fascial restrictions to min in her right shoulder region.  Long Term Goals Long Term Goal 1: Patient will return to prior level of independence with all B/IADLs and leisure activities. Long Term Goal 1 Progress: Progressing toward goal Long Term Goal 2: Patient will improve right shoulder strength to >/= 4/5 for increased ability to perform  Long Term Goal 2 Progress: Progressing toward goal Long Term Goal 3: Patient will improve RUE AROM to [facility] for increased ability to reach into overhead cabinets.  Long Term Goal 3 Progress: Progressing toward goal Long Term Goal 4: Patient will decrease right shoulder pain to </=1/10 when completing functional activities.  Long Term  Goal 4 Progress: Progressing toward goal Long Term Goal 5: Patient will decrease  fascial restrictions to trace in right shoulder region. Long Term Goal 5 Progress: Progressing toward goal  Problem List Patient Active Problem List   Diagnosis Date  Noted  . Poor balance 10/14/2013  . Repeated falls 10/14/2013  . Acute encephalopathy 07/30/2013  . Encephalopathy, hepatic 07/30/2013  . Confusion 07/29/2013  . Pancreatitis 04/26/2013  . Edema of left lower extremity 04/14/2013  . Liver cirrhosis secondary to NASH 04/14/2013  . Generalized weakness 02/19/2013  . Acute blood loss anemia 02/18/2013  . UTI (urinary tract infection) 02/14/2013  . Encephalopathy acute 02/14/2013  . Hyponatremia 02/14/2013  . Hypokalemia 02/14/2013  . Fracture of humeral shaft, right, closed 02/11/2013  . Multiple falls 01/26/2013  . Difficulty walking 01/26/2013  . Bilateral leg weakness 01/26/2013  . Personal history of fall 09/02/2012  . Dizziness 09/02/2012  . Frequent falls 08/08/2012  . Ataxia 08/08/2012  . Peripheral neuropathy 08/08/2012  . Preoperative clearance 06/06/2012  . 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 Anemia 04/08/2012  . Renal colic 17/40/8144  . Encephalopathy chronic 09/14/2011  . Posterior tibial tendon dysfunction 07/12/2011  . Hyperlipidemia 05/31/2011  . Essential hypertension 05/31/2011  . Uncontrolled type 2 diabetes mellitus with complication 81/85/6314  . GERD 09/29/2010  . Other chronic nonalcoholic liver disease 97/12/6376  . COLONIC POLYPS, HX OF 02/13/2010  . CIRRHOSIS 02/07/2010  . THROMBOCYTOPENIA, CHRONIC 01/04/2010  . PERIPHERAL NEUROPATHY 01/04/2010  . CAD, NATIVE VESSEL 01/04/2010  . ANXIETY DISORDER, HX OF 01/04/2010  . Intrinsic asthma, unspecified 03/16/2009  . ALLERGIC RHINITIS 02/18/2009  . DYSPNEA 02/18/2009  . DIABETES, TYPE 2 02/04/2009  . HYPERLIPIDEMIA 02/04/2009  . OBESITY 02/04/2009  . HYPERTENSION 02/04/2009     End of Session Activity Tolerance: Patient tolerated treatment well General Behavior During Therapy: [facility] for tasks assessed/performed   Ailene Ravel, OTR/L,CBIS   02/08/2014, 11:43 AM

## 2014-02-10 ENCOUNTER — Ambulatory Visit (HOSPITAL_COMMUNITY)
Admission: RE | Admit: 2014-02-10 | Discharge: 2014-02-10 | Disposition: A | Discharge status: Home / Self Care | Payer: Medicare Other | Source: Ambulatory Visit | Attending: Internal Medicine | Admitting: Internal Medicine

## 2014-02-10 DIAGNOSIS — Z9181 History of falling: Secondary | ICD-10-CM | POA: Insufficient documentation

## 2014-02-10 DIAGNOSIS — I10 Essential (primary) hypertension: Secondary | ICD-10-CM | POA: Insufficient documentation

## 2014-02-10 DIAGNOSIS — IMO0001 Reserved for inherently not codable concepts without codable children: Secondary | ICD-10-CM | POA: Insufficient documentation

## 2014-02-10 DIAGNOSIS — R269 Unspecified abnormalities of gait and mobility: Secondary | ICD-10-CM | POA: Insufficient documentation

## 2014-02-10 DIAGNOSIS — E119 Type 2 diabetes mellitus without complications: Secondary | ICD-10-CM | POA: Diagnosis not present

## 2014-02-10 NOTE — Progress Notes (Signed)
Occupational Therapy Treatment Patient Details  Name: [name] [name] MRN: [id number] Date of Birth: Mar 21, 1941  Today's Date: 02/10/2014 Time: 5102-5852 OT Time Calculation (min): 41 min Manual therapy 7782-4235 11' Therapeutic exercises 3614-4315 30' Visit#: 23 of 24  Re-eval: 02/12/14    Authorization: [employer] Medicare  Authorization Time Period: before 26th visit  Authorization Visit#: 23 of 26  Subjective S:  Reaching overheadl like into a cabinet is difficult for me. Pain Assessment Currently in Pain?: Yes Pain Score: 3  Pain Location: Shoulder Pain Orientation: Right Pain Type: Acute pain  Precautions/Restrictions   progress as tolerated  Exercise/Treatments Supine Protraction: PROM;Strengthening;10 reps Protraction Weight (lbs): 2 Horizontal ABduction: PROM;Strengthening;10 reps (min facilitation by OT) Horizontal ABduction Weight (lbs): 2 External Rotation: PROM;Strengthening;10 reps External Rotation Weight (lbs): 2 Internal Rotation: PROM;Strengthening;10 reps Internal Rotation Weight (lbs): 2 Flexion: PROM;Strengthening;10 reps Shoulder Flexion Weight (lbs): 2 ABduction: PROM;Strengthening;10 reps (min facilitation from OT) Seated Protraction: Strengthening;10 reps Protraction Weight (lbs): 1 Horizontal ABduction: Strengthening;10 reps Horizontal ABduction Weight (lbs): 1 Horizontal ABduction Limitations: min facilitation External Rotation: Strengthening;10 reps External Rotation Weight (lbs): 1 Internal Rotation: Strengthening;10 reps Internal Rotation Weight (lbs): 1 Flexion: Strengthening;10 reps Flexion Weight (lbs): 1 Abduction: Strengthening;10 reps (90 90 ) ABduction Weight (lbs): 1 Other Seated Exercises: graduated pinch tree placed yellow, red, green on vertical bar with relative ease, added 1# wrist cuff and patient then removed with min difficulty due to increased weight.     Manual Therapy Manual Therapy: Myofascial  release Myofascial Release: MFR and manual stretching to right upper arm, scapular, and shoulder region to decrease pain and restrictions and increase pain free mobility  Occupational Therapy Assessment and Plan OT Assessment and Plan Clinical Impression Statement: A:  Patient increased to 3# resistance with strengthening this date in supine.   OT Plan: P:  Reassess, add functional reaching activity such as organizing kitchen cabinet.    Goals Short Term Goals Short Term Goal 1: Paatient will be edcuated on HEP  Short Term Goal 2: Patient will improve PROM in right shoulder and elbow to [facility] for increased ability to fix her hair.  Short Term Goal 3: Patient will decrease right shoulder pain to 3/10 when completing functional daily  activities.  Short Term Goal 4: Patient will improve right shoulder strength to 3+/5 for increased ability to manipulate bra and improve functional mobility/safety Short Term Goal 5: Patient will decrease right shoulder fascial restrictions to min in her right shoulder region.  Long Term Goals Long Term Goal 1: Patient will return to prior level of independence with all B/IADLs and leisure activities. Long Term Goal 2: Patient will improve right shoulder strength to >/= 4/5 for increased ability to perform  Long Term Goal 3: Patient will improve RUE AROM to [facility] for increased ability to reach into overhead cabinets.  Long Term Goal 4: Patient will decrease right shoulder pain to </=1/10 when completing functional activities.  Long Term Goal 5: Patient will decrease  fascial restrictions to trace in right shoulder region.  Problem List Patient Active Problem List   Diagnosis Date Noted  . Poor balance 10/14/2013  . Repeated falls 10/14/2013  . Acute encephalopathy 07/30/2013  . Encephalopathy, hepatic 07/30/2013  . Confusion 07/29/2013  . Pancreatitis 04/26/2013  . Edema of left lower extremity 04/14/2013  . Liver cirrhosis secondary to NASH 04/14/2013  .  Generalized weakness 02/19/2013  . Acute blood loss anemia 02/18/2013  . UTI (urinary tract infection) 02/14/2013  . Encephalopathy acute  02/14/2013  . Hyponatremia 02/14/2013  . Hypokalemia 02/14/2013  . Fracture of humeral shaft, right, closed 02/11/2013  . Multiple falls 01/26/2013  . Difficulty walking 01/26/2013  . Bilateral leg weakness 01/26/2013  . Personal history of fall 09/02/2012  . Dizziness 09/02/2012  . Frequent falls 08/08/2012  . Ataxia 08/08/2012  . Peripheral neuropathy 08/08/2012  . Preoperative clearance 06/06/2012  . 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 Anemia 04/08/2012  . Renal colic 30/16/0109  . Encephalopathy chronic 09/14/2011  . Posterior tibial tendon dysfunction 07/12/2011  . Hyperlipidemia 05/31/2011  . Essential hypertension 05/31/2011  . Uncontrolled type 2 diabetes mellitus with complication 32/35/5732  . GERD 09/29/2010  . Other chronic nonalcoholic liver disease 20/25/4270  . COLONIC POLYPS, HX OF 02/13/2010  . CIRRHOSIS 02/07/2010  . THROMBOCYTOPENIA, CHRONIC 01/04/2010  . PERIPHERAL NEUROPATHY 01/04/2010  . CAD, NATIVE VESSEL 01/04/2010  . ANXIETY DISORDER, HX OF 01/04/2010  . Intrinsic asthma, unspecified 03/16/2009  . ALLERGIC RHINITIS 02/18/2009  . DYSPNEA 02/18/2009  . DIABETES, TYPE 2 02/04/2009  . HYPERLIPIDEMIA 02/04/2009  . OBESITY 02/04/2009  . HYPERTENSION 02/04/2009    End of Session Activity Tolerance: Patient tolerated treatment well General Behavior During Therapy: [facility] for tasks assessed/performed  [place], OTR/L  02/10/2014, 2:11 PM

## 2014-02-11 NOTE — Progress Notes (Signed)
Quick Note:  BUN, Cr stable on diuretic regimen. Continue as prescribed. ______ 

## 2014-02-12 ENCOUNTER — Ambulatory Visit (HOSPITAL_COMMUNITY)
Admission: RE | Admit: 2014-02-12 | Discharge: 2014-02-12 | Disposition: A | Discharge status: Home / Self Care | Payer: Medicare Other | Source: Ambulatory Visit | Attending: Family Medicine | Admitting: Family Medicine

## 2014-02-12 DIAGNOSIS — IMO0001 Reserved for inherently not codable concepts without codable children: Secondary | ICD-10-CM | POA: Diagnosis not present

## 2014-02-12 NOTE — Evaluation (Signed)
Occupational Therapy Reassessment and Discharge Summary  Patient Details  Name: [name] [name] MRN: [id number] Date of Birth: 09/03/1941  Today's Date: 02/12/2014 Time: 1022-1100 OT Time Calculation (min): 38 min MFR 0865-7846 13' Reassess 1035-1100 25'   Visit#: 24 of 24  Re-eval: 02/12/14  Assessment Diagnosis: fall s/p right proximal humerus fracture  Authorization: [employer] Medicare  Authorization Time Period: before 26th visit  Authorization Visit#: 24 of 26   Past Medical History:  Past Medical History  Diagnosis Date  . CAD in native artery   . Thrombocytopenia, unspecified   . Unspecified hereditary and idiopathic peripheral neuropathy   . Unspecified fall   . Other and unspecified hyperlipidemia   . Unspecified essential hypertension   . Obesity, unspecified   . Shortness of breath   . Personal history of neurosis   . Intrinsic asthma, unspecified   . Allergic rhinitis, cause unspecified   . Cirrhosis of liver     NASH, afp on 06/17/12 =3.7, per pt she had hep B vaccines in 1996, hep A in process.   . Colon adenomas 03/08/10    tcs by Dr. Gala Romney  . Hyperplastic polyps of stomach 03/08/10    tcs by Dr. Dudley Major  . Chronic gastritis 03/09/11    egd by Dr. Gala Romney  . History of hemorrhoids 03/08/10    tcs- internal and external  . Diverticula of colon 03/08/10    L side  . Hiatal hernia 03/08/10  . Esophagitis, erosive 03/08/10  . GERD (gastroesophageal reflux disease)   . Wears glasses   . Type II or unspecified type diabetes mellitus without mention of complication, not stated as uncontrolled   . Vertigo   . Pancreatitis   . Bilateral leg weakness    Past Surgical History:  Past Surgical History  Procedure Laterality Date  . Hysterectomy and btl      s/p  . Abdominal hysterectomy    . Tumor excision  2003    rt arm and left foot  . Colonoscopy  03/08/10    Dr. [name]-->ext/int hemorrhoids, anal paipilla, rectal polyps, desc polyps, cecal polyp, left-sided  diverticula. suboptiomal prep. next TCS 02/2015. multiple adenomas  . Esophagogastroduodenoscopy  03/08/10    Dr. [name] erosive RE, small hh, antral erosions, small 1cm are of mucosal indentation along gastric body of doubtful significance, cystic nodularity of hypophyarynx, base of tongue, base of epiglottis. benign gastric biopsies  . Tubal ligation    . Esophagogastroduodenoscopy  10/08/2011    Dr. [name] esophageal varices, antral erosion. next egd 09/2013  . Foot surgery  2003    lt foot  . Eye surgery      cataracts bilateral  . Orif humerus fracture Right 02/17/2013    Procedure: OPEN REDUCTION INTERNAL FIXATION (ORIF) RIGHT PROXIMAL HUMERUS FRACTURE;  Surgeon: [name], MD;  Location: [place];  Service: Orthopedics;  Laterality: Right;  . Partial gastrectomy      family denies  . Breast surgery    . Cardiac catheterization  02/25/2007    Dr. [name]:  normal LV systolic function, mild irregularities of LAD    Subjective Symptoms/Limitations Symptoms: S: I'm just a little sore today.  Pain Assessment Currently in Pain?: Yes Pain Score: 4  Pain Location: Shoulder Pain Orientation: Right Pain Type: Acute pain  Precautions/Restrictions  Precautions Precautions: Fall   Assessment Additional Assessments RUE AROM (degrees) RUE Overall AROM Comments: measurement taken in sitting  Right Shoulder Flexion: 95 Degrees (last progress note: 95) Right  Shoulder ABduction: 100 Degrees (last progress note: 95) Right Shoulder Internal Rotation: 90 Degrees (last progress note: 90) Right Shoulder External Rotation: 88 Degrees (last progress note: 86) RUE Strength RUE Overall Strength Comments: assessed in sitting  Right Shoulder Flexion: 3-/5 Right Shoulder ABduction: 3-/5 Right Shoulder Internal Rotation: 4/5 (last progress note: 4-/5) Right Shoulder External Rotation: 4/5 (last progress note: 3+/5) Palpation Palpation: Min fascial restriction in right upper arm,  scapularis, and trapezius region.     Exercise/Treatments    Manual Therapy Manual Therapy: Myofascial release Myofascial Release: MFR and manual stretching to right upper arm, scapular, and shoulder region to decrease pain and restrictions and increase pain free mobility  Occupational Therapy Assessment and Plan OT Assessment and Plan Clinical Impression Statement: A: Reassessment completed this date and discharge from therapy. See MD note for progress. Overall patient has met 4/5 STGs with one other goal half met. Patiet has met only 1/2 of one LTGs. After 24 visits patient has met her maximum potential for outpatient therapy.  OT Plan: P: D/C from therapy.    Goals Short Term Goals Short Term Goal 1: Paatient will be edcuated on HEP  Short Term Goal 2: Patient will improve PROM in right shoulder and elbow to [facility] for increased ability to fix her hair.  Short Term Goal 2 Progress: Met Short Term Goal 3: Patient will decrease right shoulder pain to 3/10 when completing functional daily  activities.  Short Term Goal 4: Patient will improve right shoulder strength to 3+/5 for increased ability to manipulate bra and improve functional mobility/safety Short Term Goal 4 Progress: Partly met Short Term Goal 5: Patient will decrease right shoulder fascial restrictions to min in her right shoulder region.  Long Term Goals Long Term Goal 1: Patient will return to prior level of independence with all B/IADLs and leisure activities. Long Term Goal 1 Progress: Not met Long Term Goal 2: Patient will improve right shoulder strength to >/= 4/5. Long Term Goal 2 Progress: Partly met Long Term Goal 3: Patient will improve RUE AROM to [facility] for increased ability to reach into overhead cabinets.  Long Term Goal 3 Progress: Not met Long Term Goal 4: Patient will decrease right shoulder pain to </=1/10 when completing functional activities.  Long Term Goal 4 Progress: Not met Long Term Goal 5: Patient will  decrease  fascial restrictions to trace in right shoulder region. Long Term Goal 5 Progress: Not met  Problem List Patient Active Problem List   Diagnosis Date Noted  . Poor balance 10/14/2013  . Repeated falls 10/14/2013  . Acute encephalopathy 07/30/2013  . Encephalopathy, hepatic 07/30/2013  . Confusion 07/29/2013  . Pancreatitis 04/26/2013  . Edema of left lower extremity 04/14/2013  . Liver cirrhosis secondary to NASH 04/14/2013  . Generalized weakness 02/19/2013  . Acute blood loss anemia 02/18/2013  . UTI (urinary tract infection) 02/14/2013  . Encephalopathy acute 02/14/2013  . Hyponatremia 02/14/2013  . Hypokalemia 02/14/2013  . Fracture of humeral shaft, right, closed 02/11/2013  . Multiple falls 01/26/2013  . Difficulty walking 01/26/2013  . Bilateral leg weakness 01/26/2013  . Personal history of fall 09/02/2012  . Dizziness 09/02/2012  . Frequent falls 08/08/2012  . Ataxia 08/08/2012  . Peripheral neuropathy 08/08/2012  . Preoperative clearance 06/06/2012  . 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 Anemia 04/08/2012  . Renal colic 88/50/2774  . Encephalopathy chronic 09/14/2011  . Posterior tibial tendon dysfunction 07/12/2011  . Hyperlipidemia 05/31/2011  .  Essential hypertension 05/31/2011  . Uncontrolled type 2 diabetes mellitus with complication 42/35/3614  . GERD 09/29/2010  . Other chronic nonalcoholic liver disease 43/15/4008  . COLONIC POLYPS, HX OF 02/13/2010  . CIRRHOSIS 02/07/2010  . THROMBOCYTOPENIA, CHRONIC 01/04/2010  . PERIPHERAL NEUROPATHY 01/04/2010  . CAD, NATIVE VESSEL 01/04/2010  . ANXIETY DISORDER, HX OF 01/04/2010  . Intrinsic asthma, unspecified 03/16/2009  . ALLERGIC RHINITIS 02/18/2009  . DYSPNEA 02/18/2009  . DIABETES, TYPE 2 02/04/2009  . HYPERLIPIDEMIA 02/04/2009  . OBESITY 02/04/2009  . HYPERTENSION 02/04/2009    End of Session Activity Tolerance: Patient tolerated treatment well General Behavior  During Therapy: WFL for tasks assessed/performed OT Plan of Care OT Home Exercise Plan: red theraband and HEP OT Patient Instructions: handout (scanned) Consulted and Agree with Plan of Care: Patient  GO Functional Assessment Tool Used: DASH score 13.6  (13.6% impaired. 86.4% independent) Functional Limitation: Carrying, moving and handling objects Carrying, Moving and Handling Objects Current Status [phone]): At least 1 percent but less than 20 percent impaired, limited or restricted Carrying, Moving and Handling Objects Discharge Status [phone]): At least 1 percent but less than 20 percent impaired, limited or restricted  Ailene Ravel, OTR/L,CBIS   02/12/2014, 12:06 PM  Physician Documentation Your signature is required to indicate approval of the treatment plan as stated above.  Please sign and either send electronically or make a copy of this report for your files and return this physician signed original.  Please mark one 1.__approve of plan  2. ___approve of plan with the following conditions.   ______________________________                                                          _____________________ Physician Signature                                                                                                             Date

## 2014-02-15 ENCOUNTER — Ambulatory Visit (HOSPITAL_COMMUNITY): Payer: Medicare Other

## 2014-02-17 ENCOUNTER — Encounter (HOSPITAL_COMMUNITY): Payer: Self-pay | Admitting: *Deleted

## 2014-02-17 ENCOUNTER — Encounter (HOSPITAL_COMMUNITY)
Admission: RE | Disposition: A | Discharge status: Home / Self Care | Payer: Self-pay | Source: Ambulatory Visit | Attending: Internal Medicine

## 2014-02-17 ENCOUNTER — Ambulatory Visit (HOSPITAL_COMMUNITY)
Admission: RE | Admit: 2014-02-17 | Discharge: 2014-02-17 | Disposition: A | Discharge status: Home / Self Care | Payer: Medicare Other | Source: Ambulatory Visit | Attending: Internal Medicine | Admitting: Internal Medicine

## 2014-02-17 DIAGNOSIS — Z79899 Other long term (current) drug therapy: Secondary | ICD-10-CM | POA: Insufficient documentation

## 2014-02-17 DIAGNOSIS — K319 Disease of stomach and duodenum, unspecified: Secondary | ICD-10-CM | POA: Insufficient documentation

## 2014-02-17 DIAGNOSIS — K7460 Unspecified cirrhosis of liver: Secondary | ICD-10-CM | POA: Insufficient documentation

## 2014-02-17 DIAGNOSIS — I10 Essential (primary) hypertension: Secondary | ICD-10-CM | POA: Insufficient documentation

## 2014-02-17 DIAGNOSIS — K766 Portal hypertension: Secondary | ICD-10-CM

## 2014-02-17 DIAGNOSIS — E119 Type 2 diabetes mellitus without complications: Secondary | ICD-10-CM | POA: Insufficient documentation

## 2014-02-17 DIAGNOSIS — Z794 Long term (current) use of insulin: Secondary | ICD-10-CM | POA: Insufficient documentation

## 2014-02-17 DIAGNOSIS — K3189 Other diseases of stomach and duodenum: Secondary | ICD-10-CM

## 2014-02-17 HISTORY — PX: ESOPHAGOGASTRODUODENOSCOPY: SHX5428

## 2014-02-17 LAB — GLUCOSE, CAPILLARY: Glucose-Capillary: 283 mg/dL — ABNORMAL HIGH (ref 70–99)

## 2014-02-17 SURGERY — EGD (ESOPHAGOGASTRODUODENOSCOPY)
Anesthesia: Moderate Sedation

## 2014-02-17 MED ORDER — SODIUM CHLORIDE 0.9 % IV SOLN
INTRAVENOUS | Status: DC
  Administered 2014-02-17: 10:00:00 via INTRAVENOUS

## 2014-02-17 MED ORDER — LIDOCAINE VISCOUS 2 % MT SOLN
OROMUCOSAL | Status: DC | PRN
  Administered 2014-02-17: 1 via OROMUCOSAL

## 2014-02-17 MED ORDER — MIDAZOLAM HCL 5 MG/5ML IJ SOLN
INTRAMUSCULAR | Status: AC
  Filled 2014-02-17: qty 10

## 2014-02-17 MED ORDER — ONDANSETRON HCL 4 MG/2ML IJ SOLN
INTRAMUSCULAR | Status: DC | PRN
  Administered 2014-02-17: 4 mg via INTRAVENOUS

## 2014-02-17 MED ORDER — ONDANSETRON HCL 4 MG/2ML IJ SOLN
INTRAMUSCULAR | Status: AC
  Filled 2014-02-17: qty 2

## 2014-02-17 MED ORDER — MEPERIDINE HCL 100 MG/ML IJ SOLN
INTRAMUSCULAR | Status: AC
  Filled 2014-02-17: qty 2

## 2014-02-17 MED ORDER — MIDAZOLAM HCL 5 MG/5ML IJ SOLN
INTRAMUSCULAR | Status: DC | PRN
  Administered 2014-02-17 (×3): 2 mg via INTRAVENOUS

## 2014-02-17 MED ORDER — MEPERIDINE HCL 100 MG/ML IJ SOLN
INTRAMUSCULAR | Status: DC | PRN
  Administered 2014-02-17 (×3): 25 mg via INTRAVENOUS

## 2014-02-17 MED ORDER — STERILE WATER FOR IRRIGATION IR SOLN
Status: DC | PRN
  Administered 2014-02-17: 11:00:00

## 2014-02-17 MED ORDER — LIDOCAINE VISCOUS 2 % MT SOLN
OROMUCOSAL | Status: AC
  Filled 2014-02-17: qty 15

## 2014-02-17 NOTE — Op Note (Signed)
[facility] [address] [place], [address]   ENDOSCOPY PROCEDURE REPORT  PATIENT: [name] [name], [name] [name]  MR#: [id number] BIRTHDATE: [birth year] , 34  yrs. old GENDER: Female ENDOSCOPIST: R.  Garfield Cornea, MD FACP FACG REFERRED BY:  Sharilyn Sites, M.D. PROCEDURE DATE:  02/17/2014 PROCEDURE:     Screening EGD  INDICATIONS:     History of [place]/cirrhosis; check for varices today  INFORMED CONSENT:   The risks, benefits, limitations, alternatives and imponderables have been discussed.  The potential for biopsy, esophogeal dilation, etc. have also been reviewed.  Questions have been answered.  All parties agreeable.  Please see the history and physical in the medical record for more information.  MEDICATIONS:     Versed 6 mg IV and Demerol 75 mg IV in divided doses. Xylocaine gel orally. Zofran 4 mg IV  DESCRIPTION OF PROCEDURE:   The [id number] ([id number])  endoscope was introduced through the mouth and advanced to the second portion of the duodenum without difficulty or limitations.  The mucosal surfaces were surveyed very carefully during advancement of the scope and upon withdrawal.  Retroflexion view of the proximal stomach and esophagogastric junction was performed.      FINDINGS:    Normal appearing tubular esophagus. No varices. Stomach empty. "Snake skinning" /  patchy erythema of the gastric mucosa consistent with portal gastropathy. No gastric varices, ulcer or infiltrating process or other abnormality seen. Patent pylorus. Normal first and second portion of the duodenum.  THERAPEUTIC / DIAGNOSTIC MANEUVERS PERFORMED:  None   COMPLICATIONS:  None  IMPRESSION:    Portal gastropathy. No esophageal varices.  RECOMMENDATIONS:    Repeat screening EGD in 2 years    _______________________________ R. Garfield Cornea, MD FACP [facility] eSigned:  R. Garfield Cornea, MD FACP [facility] 02/17/2014 11:18 AM     CC:

## 2014-02-17 NOTE — Interval H&P Note (Signed)
History and Physical Interval Note:  02/17/2014 10:52 AM  [name] [name]  has presented today for surgery, with the diagnosis of VARICEAL SCREENING  The various methods of treatment have been discussed with the patient and family. After consideration of risks, benefits and other options for treatment, the patient has consented to  Procedure(s) with comments: ESOPHAGOGASTRODUODENOSCOPY (EGD) (N/A) - 10:30 as a surgical intervention .  The patient's history has been reviewed, patient examined, no change in status, stable for surgery.  I have reviewed the patient's chart and labs.  Questions were answered to the patient's satisfaction.     No change. Surveillance EGD per plan.The risks, benefits, limitations, alternatives and imponderables have been reviewed with the patient. Potential for esophageal dilation, biopsy, etc. have also been reviewed.  Questions have been answered. All parties agreeable.  Cristopher Estimable Oswald Pott

## 2014-02-17 NOTE — Discharge Instructions (Signed)
EGD Discharge instructions Please read the instructions outlined below and refer to this sheet in the next few weeks. These discharge instructions provide you with general information on caring for yourself after you leave the hospital. Your doctor may also give you specific instructions. While your treatment has been planned according to the most current medical practices available, unavoidable complications occasionally occur. If you have any problems or questions after discharge, please call your doctor. ACTIVITY  You may resume your regular activity but move at a slower pace for the next 24 hours.   Take frequent rest periods for the next 24 hours.   Walking will help expel (get rid of) the air and reduce the bloated feeling in your abdomen.   No driving for 24 hours (because of the anesthesia (medicine) used during the test).   You may shower.   Do not sign any important legal documents or operate any machinery for 24 hours (because of the anesthesia used during the test).  NUTRITION  Drink plenty of fluids.   You may resume your normal diet.   Begin with a light meal and progress to your normal diet.   Avoid alcoholic beverages for 24 hours or as instructed by your caregiver.  MEDICATIONS  You may resume your normal medications unless your caregiver tells you otherwise.  WHAT YOU CAN EXPECT TODAY  You may experience abdominal discomfort such as a feeling of fullness or gas pains.  FOLLOW-UP  Your doctor will discuss the results of your test with you.  SEEK IMMEDIATE MEDICAL ATTENTION IF ANY OF THE FOLLOWING OCCUR:  Excessive nausea (feeling sick to your stomach) and/or vomiting.   Severe abdominal pain and distention (swelling).   Trouble swallowing.   Temperature over 101 F (37.8 C).   Rectal bleeding or vomiting of blood.    Repeat EGD to check for esophageal varices in 2 years  Followup appointment with [place] in the office in 6 months

## 2014-02-17 NOTE — H&P (View-Only) (Signed)
Quick Note:  BUN, Cr stable on diuretic regimen. Continue as prescribed. ______

## 2014-02-22 ENCOUNTER — Telehealth: Payer: Medicare Other | Admitting: Internal Medicine

## 2014-02-22 ENCOUNTER — Encounter (HOSPITAL_COMMUNITY): Payer: Self-pay | Admitting: Internal Medicine

## 2014-03-02 NOTE — Telephone Encounter (Signed)
Closing encounter per RMR.

## 2014-03-08 ENCOUNTER — Other Ambulatory Visit (HOSPITAL_COMMUNITY): Payer: Self-pay | Admitting: Orthopedic Surgery

## 2014-03-08 DIAGNOSIS — IMO0002 Reserved for concepts with insufficient information to code with codable children: Secondary | ICD-10-CM

## 2014-03-10 ENCOUNTER — Ambulatory Visit (HOSPITAL_COMMUNITY)
Admission: RE | Admit: 2014-03-10 | Discharge: 2014-03-10 | Disposition: A | Discharge status: Home / Self Care | Payer: Medicare Other | Source: Ambulatory Visit | Attending: Orthopedic Surgery | Admitting: Orthopedic Surgery

## 2014-03-10 DIAGNOSIS — IMO0002 Reserved for concepts with insufficient information to code with codable children: Secondary | ICD-10-CM | POA: Insufficient documentation

## 2014-03-23 ENCOUNTER — Ambulatory Visit (INDEPENDENT_AMBULATORY_CARE_PROVIDER_SITE_OTHER): Payer: Medicare Other | Admitting: Urology

## 2014-03-23 DIAGNOSIS — N3020 Other chronic cystitis without hematuria: Secondary | ICD-10-CM

## 2014-03-23 DIAGNOSIS — N952 Postmenopausal atrophic vaginitis: Secondary | ICD-10-CM

## 2014-04-01 NOTE — Addendum Note (Signed)
Encounter addended by: Guerry Bruin, OT on: 04/01/2014 12:53 PM<BR>     Documentation filed: Episodes

## 2014-04-08 ENCOUNTER — Encounter (HOSPITAL_COMMUNITY): Payer: Self-pay | Admitting: Emergency Medicine

## 2014-04-08 DIAGNOSIS — K7460 Unspecified cirrhosis of liver: Secondary | ICD-10-CM | POA: Diagnosis present

## 2014-04-08 DIAGNOSIS — Z8249 Family history of ischemic heart disease and other diseases of the circulatory system: Secondary | ICD-10-CM

## 2014-04-08 DIAGNOSIS — K8020 Calculus of gallbladder without cholecystitis without obstruction: Secondary | ICD-10-CM | POA: Diagnosis present

## 2014-04-08 DIAGNOSIS — E785 Hyperlipidemia, unspecified: Secondary | ICD-10-CM | POA: Diagnosis present

## 2014-04-08 DIAGNOSIS — Z7982 Long term (current) use of aspirin: Secondary | ICD-10-CM

## 2014-04-08 DIAGNOSIS — Z903 Acquired absence of stomach [part of]: Secondary | ICD-10-CM

## 2014-04-08 DIAGNOSIS — Z79899 Other long term (current) drug therapy: Secondary | ICD-10-CM

## 2014-04-08 DIAGNOSIS — I2510 Atherosclerotic heart disease of native coronary artery without angina pectoris: Secondary | ICD-10-CM | POA: Diagnosis present

## 2014-04-08 DIAGNOSIS — K859 Acute pancreatitis without necrosis or infection, unspecified: Principal | ICD-10-CM | POA: Diagnosis present

## 2014-04-08 DIAGNOSIS — I10 Essential (primary) hypertension: Secondary | ICD-10-CM | POA: Diagnosis present

## 2014-04-08 DIAGNOSIS — Z794 Long term (current) use of insulin: Secondary | ICD-10-CM

## 2014-04-08 DIAGNOSIS — K861 Other chronic pancreatitis: Secondary | ICD-10-CM | POA: Diagnosis present

## 2014-04-08 DIAGNOSIS — E119 Type 2 diabetes mellitus without complications: Secondary | ICD-10-CM | POA: Diagnosis present

## 2014-04-08 DIAGNOSIS — Z833 Family history of diabetes mellitus: Secondary | ICD-10-CM

## 2014-04-08 DIAGNOSIS — G9340 Encephalopathy, unspecified: Secondary | ICD-10-CM | POA: Diagnosis present

## 2014-04-08 DIAGNOSIS — Z87440 Personal history of urinary (tract) infections: Secondary | ICD-10-CM

## 2014-04-08 DIAGNOSIS — E669 Obesity, unspecified: Secondary | ICD-10-CM | POA: Diagnosis present

## 2014-04-08 DIAGNOSIS — N390 Urinary tract infection, site not specified: Secondary | ICD-10-CM | POA: Diagnosis present

## 2014-04-08 DIAGNOSIS — D61818 Other pancytopenia: Secondary | ICD-10-CM | POA: Diagnosis present

## 2014-04-08 DIAGNOSIS — K7689 Other specified diseases of liver: Secondary | ICD-10-CM | POA: Diagnosis present

## 2014-04-08 DIAGNOSIS — K219 Gastro-esophageal reflux disease without esophagitis: Secondary | ICD-10-CM | POA: Diagnosis present

## 2014-04-08 NOTE — ED Notes (Signed)
Hurting real bad in my stomach, may be pancreatitis, sick on my stomach per pt.

## 2014-04-09 ENCOUNTER — Encounter (HOSPITAL_COMMUNITY): Payer: Self-pay | Admitting: Internal Medicine

## 2014-04-09 ENCOUNTER — Emergency Department (HOSPITAL_COMMUNITY): Payer: Medicare Other

## 2014-04-09 ENCOUNTER — Inpatient Hospital Stay (HOSPITAL_COMMUNITY)
Admission: EM | Admit: 2014-04-09 | Discharge: 2014-04-12 | DRG: 438 | Disposition: A | Discharge status: Home Health | Payer: Medicare Other | Attending: Internal Medicine | Admitting: Internal Medicine

## 2014-04-09 DIAGNOSIS — I2510 Atherosclerotic heart disease of native coronary artery without angina pectoris: Secondary | ICD-10-CM

## 2014-04-09 DIAGNOSIS — K859 Acute pancreatitis without necrosis or infection, unspecified: Principal | ICD-10-CM

## 2014-04-09 DIAGNOSIS — Z86010 Personal history of colonic polyps: Secondary | ICD-10-CM

## 2014-04-09 DIAGNOSIS — E876 Hypokalemia: Secondary | ICD-10-CM

## 2014-04-09 DIAGNOSIS — E669 Obesity, unspecified: Secondary | ICD-10-CM

## 2014-04-09 DIAGNOSIS — E118 Type 2 diabetes mellitus with unspecified complications: Secondary | ICD-10-CM

## 2014-04-09 DIAGNOSIS — G609 Hereditary and idiopathic neuropathy, unspecified: Secondary | ICD-10-CM

## 2014-04-09 DIAGNOSIS — K7682 Hepatic encephalopathy: Secondary | ICD-10-CM

## 2014-04-09 DIAGNOSIS — R410 Disorientation, unspecified: Secondary | ICD-10-CM

## 2014-04-09 DIAGNOSIS — R739 Hyperglycemia, unspecified: Secondary | ICD-10-CM

## 2014-04-09 DIAGNOSIS — D696 Thrombocytopenia, unspecified: Secondary | ICD-10-CM

## 2014-04-09 DIAGNOSIS — J309 Allergic rhinitis, unspecified: Secondary | ICD-10-CM

## 2014-04-09 DIAGNOSIS — G9349 Other encephalopathy: Secondary | ICD-10-CM

## 2014-04-09 DIAGNOSIS — R29898 Other symptoms and signs involving the musculoskeletal system: Secondary | ICD-10-CM

## 2014-04-09 DIAGNOSIS — J45909 Unspecified asthma, uncomplicated: Secondary | ICD-10-CM

## 2014-04-09 DIAGNOSIS — M76829 Posterior tibial tendinitis, unspecified leg: Secondary | ICD-10-CM

## 2014-04-09 DIAGNOSIS — K219 Gastro-esophageal reflux disease without esophagitis: Secondary | ICD-10-CM | POA: Diagnosis present

## 2014-04-09 DIAGNOSIS — R531 Weakness: Secondary | ICD-10-CM

## 2014-04-09 DIAGNOSIS — N23 Unspecified renal colic: Secondary | ICD-10-CM

## 2014-04-09 DIAGNOSIS — Z9181 History of falling: Secondary | ICD-10-CM

## 2014-04-09 DIAGNOSIS — R600 Localized edema: Secondary | ICD-10-CM

## 2014-04-09 DIAGNOSIS — K7581 Nonalcoholic steatohepatitis (NASH): Secondary | ICD-10-CM

## 2014-04-09 DIAGNOSIS — E1165 Type 2 diabetes mellitus with hyperglycemia: Secondary | ICD-10-CM

## 2014-04-09 DIAGNOSIS — S42301A Unspecified fracture of shaft of humerus, right arm, initial encounter for closed fracture: Secondary | ICD-10-CM

## 2014-04-09 DIAGNOSIS — D62 Acute posthemorrhagic anemia: Secondary | ICD-10-CM | POA: Diagnosis present

## 2014-04-09 DIAGNOSIS — N390 Urinary tract infection, site not specified: Secondary | ICD-10-CM | POA: Diagnosis present

## 2014-04-09 DIAGNOSIS — Z8659 Personal history of other mental and behavioral disorders: Secondary | ICD-10-CM

## 2014-04-09 DIAGNOSIS — E871 Hypo-osmolality and hyponatremia: Secondary | ICD-10-CM

## 2014-04-09 DIAGNOSIS — K7460 Unspecified cirrhosis of liver: Secondary | ICD-10-CM

## 2014-04-09 DIAGNOSIS — R270 Ataxia, unspecified: Secondary | ICD-10-CM

## 2014-04-09 DIAGNOSIS — K7290 Hepatic failure, unspecified without coma: Secondary | ICD-10-CM

## 2014-04-09 DIAGNOSIS — R2689 Other abnormalities of gait and mobility: Secondary | ICD-10-CM

## 2014-04-09 DIAGNOSIS — E119 Type 2 diabetes mellitus without complications: Secondary | ICD-10-CM

## 2014-04-09 DIAGNOSIS — E785 Hyperlipidemia, unspecified: Secondary | ICD-10-CM

## 2014-04-09 DIAGNOSIS — D649 Anemia, unspecified: Secondary | ICD-10-CM

## 2014-04-09 DIAGNOSIS — G9340 Encephalopathy, unspecified: Secondary | ICD-10-CM

## 2014-04-09 DIAGNOSIS — Z01818 Encounter for other preprocedural examination: Secondary | ICD-10-CM

## 2014-04-09 DIAGNOSIS — R42 Dizziness and giddiness: Secondary | ICD-10-CM

## 2014-04-09 DIAGNOSIS — R262 Difficulty in walking, not elsewhere classified: Secondary | ICD-10-CM

## 2014-04-09 DIAGNOSIS — N6099 Unspecified benign mammary dysplasia of unspecified breast: Secondary | ICD-10-CM

## 2014-04-09 DIAGNOSIS — D61818 Other pancytopenia: Secondary | ICD-10-CM | POA: Diagnosis present

## 2014-04-09 DIAGNOSIS — R0602 Shortness of breath: Secondary | ICD-10-CM

## 2014-04-09 DIAGNOSIS — R296 Repeated falls: Secondary | ICD-10-CM

## 2014-04-09 DIAGNOSIS — I10 Essential (primary) hypertension: Secondary | ICD-10-CM | POA: Diagnosis present

## 2014-04-09 DIAGNOSIS — K7689 Other specified diseases of liver: Secondary | ICD-10-CM

## 2014-04-09 DIAGNOSIS — G629 Polyneuropathy, unspecified: Secondary | ICD-10-CM

## 2014-04-09 DIAGNOSIS — IMO0002 Reserved for concepts with insufficient information to code with codable children: Secondary | ICD-10-CM

## 2014-04-09 HISTORY — DX: Other pancytopenia: D61.818

## 2014-04-09 HISTORY — DX: Thrombocytopenia, unspecified: D69.6

## 2014-04-09 LAB — CBC WITH DIFFERENTIAL/PLATELET
BASOS PCT: 0 % (ref 0–1)
Basophils Absolute: 0 10*3/uL (ref 0.0–0.1)
EOS PCT: 0 % (ref 0–5)
Eosinophils Absolute: 0 10*3/uL (ref 0.0–0.7)
HCT: 37 % (ref 36.0–46.0)
HEMOGLOBIN: 12.1 g/dL (ref 12.0–15.0)
LYMPHS PCT: 7 % — AB (ref 12–46)
Lymphs Abs: 0.5 10*3/uL — ABNORMAL LOW (ref 0.7–4.0)
MCH: 25.3 pg — ABNORMAL LOW (ref 26.0–34.0)
MCHC: 32.7 g/dL (ref 30.0–36.0)
MCV: 77.2 fL — ABNORMAL LOW (ref 78.0–100.0)
Monocytes Absolute: 0.3 10*3/uL (ref 0.1–1.0)
Monocytes Relative: 4 % (ref 3–12)
NEUTROS PCT: 89 % — AB (ref 43–77)
Neutro Abs: 6.8 10*3/uL (ref 1.7–7.7)
PLATELETS: 119 10*3/uL — AB (ref 150–400)
RBC: 4.79 MIL/uL (ref 3.87–5.11)
RDW: 14.9 % (ref 11.5–15.5)
WBC: 7.6 10*3/uL (ref 4.0–10.5)

## 2014-04-09 LAB — URINE MICROSCOPIC-ADD ON

## 2014-04-09 LAB — GLUCOSE, CAPILLARY
Glucose-Capillary: 271 mg/dL — ABNORMAL HIGH (ref 70–99)
Glucose-Capillary: 306 mg/dL — ABNORMAL HIGH (ref 70–99)
Glucose-Capillary: 203 mg/dL — ABNORMAL HIGH (ref 70–99)
Glucose-Capillary: 215 mg/dL — ABNORMAL HIGH (ref 70–99)

## 2014-04-09 LAB — COMPREHENSIVE METABOLIC PANEL
ALBUMIN: 3.6 g/dL (ref 3.5–5.2)
ALK PHOS: 108 U/L (ref 39–117)
ALT: 10 U/L (ref 0–35)
AST: 24 U/L (ref 0–37)
BILIRUBIN TOTAL: 1.7 mg/dL — AB (ref 0.3–1.2)
BUN: 18 mg/dL (ref 6–23)
CHLORIDE: 101 meq/L (ref 96–112)
CO2: 25 mEq/L (ref 19–32)
Calcium: 9.4 mg/dL (ref 8.4–10.5)
Creatinine, Ser: 0.78 mg/dL (ref 0.50–1.10)
GFR calc Af Amer: >90 mL/min (ref >90)
GFR calc non Af Amer: 83 mL/min — ABNORMAL LOW (ref >90)
Glucose, Bld: 251 mg/dL — ABNORMAL HIGH (ref 70–99)
Potassium: 4.3 mEq/L (ref 3.7–5.3)
Sodium: 140 mEq/L (ref 137–147)
TOTAL PROTEIN: 6.6 g/dL (ref 6.0–8.3)

## 2014-04-09 LAB — URINALYSIS, ROUTINE W REFLEX MICROSCOPIC
Bilirubin Urine: NEGATIVE
GLUCOSE, UA: NEGATIVE mg/dL
NITRITE: POSITIVE — AB
Urobilinogen, UA: 0.2 mg/dL (ref 0.0–1.0)
pH: 5.5 (ref 5.0–8.0)

## 2014-04-09 LAB — LIPASE, BLOOD: LIPASE: 814 U/L — AB (ref 11–59)

## 2014-04-09 LAB — AMMONIA: Ammonia: 51 umol/L (ref 11–60)

## 2014-04-09 MED ORDER — KCL IN DEXTROSE-NACL 20-5-0.9 MEQ/L-%-% IV SOLN
INTRAVENOUS | Status: DC
  Administered 2014-04-09: 08:00:00 via INTRAVENOUS

## 2014-04-09 MED ORDER — SODIUM CHLORIDE 0.9 % IV BOLUS (SEPSIS)
500.0000 mL | Freq: Once | INTRAVENOUS | Status: AC
  Administered 2014-04-09: 500 mL via INTRAVENOUS

## 2014-04-09 MED ORDER — ONDANSETRON HCL 4 MG PO TABS: 4.0000 mg | ORAL_TABLET | Freq: Four times a day (QID) | ORAL | Status: DC | PRN

## 2014-04-09 MED ORDER — ENALAPRIL MALEATE 5 MG PO TABS
5.0000 mg | ORAL_TABLET | Freq: Every day | ORAL | Status: DC
  Administered 2014-04-09 – 2014-04-12 (×4): 5 mg via ORAL
  Filled 2014-04-09 (×4): qty 1

## 2014-04-09 MED ORDER — ONDANSETRON HCL 4 MG/2ML IJ SOLN
4.0000 mg | Freq: Once | INTRAMUSCULAR | Status: AC
  Administered 2014-04-09: 4 mg via INTRAVENOUS
  Filled 2014-04-09: qty 2

## 2014-04-09 MED ORDER — INSULIN GLARGINE 100 UNIT/ML ~~LOC~~ SOLN
20.0000 [IU] | Freq: Every day | SUBCUTANEOUS | Status: DC
Start: 2014-04-09 — End: 2014-04-12
  Administered 2014-04-09 – 2014-04-11 (×3): 20 [IU] via SUBCUTANEOUS
  Filled 2014-04-09 (×4): qty 0.2

## 2014-04-09 MED ORDER — INSULIN ASPART 100 UNIT/ML ~~LOC~~ SOLN
0.0000 [IU] | Freq: Three times a day (TID) | SUBCUTANEOUS | Status: DC
  Administered 2014-04-09: 8 [IU] via SUBCUTANEOUS

## 2014-04-09 MED ORDER — DEXTROSE 5 % IV SOLN
1.0000 g | INTRAVENOUS | Status: DC
  Administered 2014-04-09: 1 g via INTRAVENOUS
  Filled 2014-04-09: qty 10

## 2014-04-09 MED ORDER — ASPIRIN EC 81 MG PO TBEC
81.0000 mg | DELAYED_RELEASE_TABLET | Freq: Every day | ORAL | Status: DC
  Administered 2014-04-09: 81 mg via ORAL
  Filled 2014-04-09: qty 1

## 2014-04-09 MED ORDER — DEXTROSE 5 % IV SOLN
1.0000 g | INTRAVENOUS | Status: DC
  Administered 2014-04-09 – 2014-04-12 (×4): 1 g via INTRAVENOUS
  Filled 2014-04-09 (×5): qty 10

## 2014-04-09 MED ORDER — HYDROMORPHONE HCL PF 1 MG/ML IJ SOLN
1.0000 mg | INTRAMUSCULAR | Status: DC | PRN
  Administered 2014-04-09 – 2014-04-12 (×7): 1 mg via INTRAVENOUS
  Filled 2014-04-09 (×8): qty 1

## 2014-04-09 MED ORDER — SPIRONOLACTONE 25 MG PO TABS
50.0000 mg | ORAL_TABLET | Freq: Every day | ORAL | Status: DC
  Administered 2014-04-09 – 2014-04-12 (×4): 50 mg via ORAL
  Filled 2014-04-09 (×4): qty 2

## 2014-04-09 MED ORDER — ALPRAZOLAM 0.5 MG PO TABS
0.5000 mg | ORAL_TABLET | Freq: Three times a day (TID) | ORAL | Status: DC | PRN
  Administered 2014-04-10 (×2): 0.5 mg via ORAL
  Filled 2014-04-09 (×2): qty 1

## 2014-04-09 MED ORDER — SODIUM CHLORIDE 0.9 % IV SOLN
Freq: Once | INTRAVENOUS | Status: AC
  Administered 2014-04-09: 1000 mL via INTRAVENOUS

## 2014-04-09 MED ORDER — HEPARIN SODIUM (PORCINE) 5000 UNIT/ML IJ SOLN
5000.0000 [IU] | Freq: Three times a day (TID) | INTRAMUSCULAR | Status: DC
Start: 2014-04-09 — End: 2014-04-12
  Administered 2014-04-09 – 2014-04-12 (×10): 5000 [IU] via SUBCUTANEOUS
  Filled 2014-04-09 (×10): qty 1

## 2014-04-09 MED ORDER — INSULIN ASPART 100 UNIT/ML ~~LOC~~ SOLN: 0.0000 [IU] | Freq: Every day | SUBCUTANEOUS | Status: DC

## 2014-04-09 MED ORDER — SODIUM CHLORIDE 0.9 % IV SOLN
INTRAVENOUS | Status: DC
  Administered 2014-04-09 – 2014-04-10 (×4): via INTRAVENOUS

## 2014-04-09 MED ORDER — MOMETASONE FURO-FORMOTEROL FUM 200-5 MCG/ACT IN AERO
2.0000 | INHALATION_SPRAY | Freq: Two times a day (BID) | RESPIRATORY_TRACT | Status: DC
  Administered 2014-04-10 – 2014-04-12 (×4): 2 via RESPIRATORY_TRACT
  Filled 2014-04-09 (×18): qty 8.8

## 2014-04-09 MED ORDER — ONDANSETRON HCL 4 MG/2ML IJ SOLN
4.0000 mg | Freq: Four times a day (QID) | INTRAMUSCULAR | Status: DC | PRN
  Administered 2014-04-09: 4 mg via INTRAVENOUS
  Filled 2014-04-09: qty 2

## 2014-04-09 MED ORDER — INSULIN ASPART 100 UNIT/ML ~~LOC~~ SOLN
0.0000 [IU] | Freq: Three times a day (TID) | SUBCUTANEOUS | Status: DC
  Administered 2014-04-09: 3 [IU] via SUBCUTANEOUS
  Administered 2014-04-09: 7 [IU] via SUBCUTANEOUS
  Administered 2014-04-10: 3 [IU] via SUBCUTANEOUS
  Administered 2014-04-10 – 2014-04-12 (×7): 2 [IU] via SUBCUTANEOUS

## 2014-04-09 MED ORDER — LACTULOSE 10 GM/15ML PO SOLN
10.0000 g | Freq: Three times a day (TID) | ORAL | Status: DC
  Administered 2014-04-09 – 2014-04-12 (×11): 10 g via ORAL
  Filled 2014-04-09 (×11): qty 30

## 2014-04-09 MED ORDER — PANTOPRAZOLE SODIUM 40 MG PO PACK
40.0000 mg | PACK | Freq: Every day | ORAL | Status: DC
  Administered 2014-04-09: 40 mg via ORAL
  Filled 2014-04-09 (×4): qty 20

## 2014-04-09 MED ORDER — HYDROMORPHONE HCL PF 1 MG/ML IJ SOLN
0.5000 mg | Freq: Once | INTRAMUSCULAR | Status: AC
  Administered 2014-04-09: 0.5 mg via INTRAVENOUS
  Filled 2014-04-09: qty 1

## 2014-04-09 MED ORDER — ESTROGENS, CONJUGATED 0.625 MG/GM VA CREA
1.0000 | TOPICAL_CREAM | VAGINAL | Status: DC
  Administered 2014-04-11: 1 via VAGINAL
  Filled 2014-04-09: qty 42.5

## 2014-04-09 MED ORDER — INSULIN ASPART 100 UNIT/ML ~~LOC~~ SOLN
0.0000 [IU] | Freq: Every day | SUBCUTANEOUS | Status: DC
  Administered 2014-04-09: 2 [IU] via SUBCUTANEOUS

## 2014-04-09 MED ORDER — ESTRADIOL 0.1 MG/GM VA CREA
1.0000 | TOPICAL_CREAM | VAGINAL | Status: DC
  Filled 2014-04-09: qty 42.5

## 2014-04-09 MED ORDER — PREGABALIN 50 MG PO CAPS
100.0000 mg | ORAL_CAPSULE | Freq: Every day | ORAL | Status: DC
  Administered 2014-04-09 – 2014-04-12 (×4): 100 mg via ORAL
  Filled 2014-04-09 (×4): qty 2

## 2014-04-09 NOTE — H&P (Signed)
[facility] History and Physical  [name] YQI:[id number] DOB: Jul 09, 1941    PCP:   Purvis Kilts, MD   Chief Complaint: abdominal pain, nausea and vomiting for one day.  HPI: [name] [name] is an 73 y.o. female with hx of NASH, liver cirrhosis, hx of hepatic encephalopathy on chronic lactullose, HTN, DM2, recurrent UTI on Keflex as suppressive therapy, presents to the ER with one day hx of epigastric pain, nausea and vomiting.  She has had no fever chills, black or bloody stool. Though she has frequent loose stool on lactulose, she has had no frank diarrhea.  Evalatuion in the ER included a lipase of 814, with no elevated LFTs, total bili is is 1.7, and no leukocytosis.  Her amonia level is normal.  She was given antiemetics, but was not able to tolerate oral intake, so hospitalist was asked to admit her for non alcoholic pancreatitis, and possilby UTI also.  Rewiew of Systems:  Constitutional: Negative for malaise, fever and chills. No significant weight loss or weight gain Eyes: Negative for eye pain, redness and discharge, diplopia, visual changes, or flashes of light. ENMT: Negative for ear pain, hoarseness, nasal congestion, sinus pressure and sore throat. No headaches; tinnitus, drooling, or problem swallowing. Cardiovascular: Negative for chest pain, palpitations, diaphoresis, dyspnea and peripheral edema. ; No orthopnea, PND Respiratory: Negative for cough, hemoptysis, wheezing and stridor. No pleuritic chestpain. Gastrointestinal: Negative for  constipation,  melena, blood in stool, hematemesis, jaundice and rectal bleeding.    Genitourinary: Negative for frequency, dysuria, incontinence,flank pain and hematuria; Musculoskeletal: Negative for back pain and neck pain. Negative for swelling and trauma.;  Skin: . Negative for pruritus, rash, abrasions, bruising and skin lesion.; ulcerations Neuro: Negative for headache, lightheadedness and neck stiffness. Negative for  weakness, altered level of consciousness , altered mental status, extremity weakness, burning feet, involuntary movement, seizure and syncope.  Psych: negative for anxiety, depression, insomnia, tearfulness, panic attacks, hallucinations, paranoia, suicidal or homicidal ideation    Past Medical History  Diagnosis Date  . CAD in native artery   . Thrombocytopenia, unspecified   . Unspecified hereditary and idiopathic peripheral neuropathy   . Unspecified fall   . Other and unspecified hyperlipidemia   . Unspecified essential hypertension   . Obesity, unspecified   . Shortness of breath   . Personal history of neurosis   . Intrinsic asthma, unspecified   . Allergic rhinitis, cause unspecified   . Cirrhosis of liver     NASH, afp on 06/17/12 =3.7, per pt she had hep B vaccines in 1996, hep A in process.   . Colon adenomas 03/08/10    tcs by Dr. Gala Romney  . Hyperplastic polyps of stomach 03/08/10    tcs by Dr. Dudley Major  . Chronic gastritis 03/09/11    egd by Dr. Gala Romney  . History of hemorrhoids 03/08/10    tcs- internal and external  . Diverticula of colon 03/08/10    L side  . Hiatal hernia 03/08/10  . Esophagitis, erosive 03/08/10  . GERD (gastroesophageal reflux disease)   . Wears glasses   . Type II or unspecified type diabetes mellitus without mention of complication, not stated as uncontrolled   . Vertigo   . Pancreatitis   . Bilateral leg weakness     Past Surgical History  Procedure Laterality Date  . Hysterectomy and btl      s/p  . Abdominal hysterectomy    . Tumor excision  2003    rt arm  and left foot  . Colonoscopy  03/08/10    Dr. [name]-->ext/int hemorrhoids, anal paipilla, rectal polyps, desc polyps, cecal polyp, left-sided diverticula. suboptiomal prep. next TCS 02/2015. multiple adenomas  . Esophagogastroduodenoscopy  03/08/10    Dr. [name] erosive RE, small hh, antral erosions, small 1cm are of mucosal indentation along gastric body of doubtful significance, cystic  nodularity of hypophyarynx, base of tongue, base of epiglottis. benign gastric biopsies  . Tubal ligation    . Esophagogastroduodenoscopy  10/08/2011    Dr. [name] esophageal varices, antral erosion. next egd 09/2013  . Foot surgery  2003    lt foot  . Eye surgery      cataracts bilateral  . Orif humerus fracture Right 02/17/2013    Procedure: OPEN REDUCTION INTERNAL FIXATION (ORIF) RIGHT PROXIMAL HUMERUS FRACTURE;  Surgeon: [name], MD;  Location: [facility];  Service: Orthopedics;  Laterality: Right;  . Partial gastrectomy      family denies  . Breast surgery    . Cardiac catheterization  02/25/2007    Dr. [name]:  normal LV systolic function, mild irregularities of LAD  . Esophagogastroduodenoscopy N/A 02/17/2014    Procedure: ESOPHAGOGASTRODUODENOSCOPY (EGD);  Surgeon: Daneil Dolin, MD;  [facility]: [facility];  Service: Endoscopy;  Laterality: N/A;  10:30    Medications:  HOME MEDS: Prior to Admission medications   Medication Sig Start Date End Date Taking? Authorizing Provider  ALPRAZolam Duanne Moron) 0.5 MG tablet Take 0.5 mg by mouth 3 (three) times daily as needed for anxiety. 08/09/12   Kathie Dike, MD  aspirin (BAYER LOW STRENGTH) 81 MG EC tablet Take 81 mg by mouth daily.      Historical Provider, MD  budesonide-formoterol (SYMBICORT) 160-4.5 MCG/ACT inhaler Inhale 2 puffs into the lungs 2 (two) times daily.      Historical Provider, MD  enalapril (VASOTEC) 5 MG tablet Take 5 mg by mouth daily.      Historical Provider, MD  esomeprazole (NEXIUM) 40 MG capsule Take 40 mg by mouth every morning.    Historical Provider, MD  ESTRACE VAGINAL 0.1 MG/GM vaginal cream Place vaginally every other day. At  bedtime 08/11/13   Historical Provider, MD  furosemide (LASIX) 20 MG tablet Take 1 tablet (20 mg total) by mouth daily. 01/28/14   Orvil Feil, NP  [facility] 10 GM/15ML SOLN Take 10 g by mouth 3 (three) times daily.  09/06/13   Historical Provider, MD  insulin aspart (NOVOLOG) 100  UNIT/ML injection Inject 10-14 Units into the skin daily as needed (90-150= 10 units 151-200= 11 units 201-250= 12 units 250-300= 13 units 301-350= 14 units as directed per sliding scale instructions). Per sliding scale. Patient injects 10-40 units for levels above 150 to 300    Historical Provider, MD  insulin detemir (LEVEMIR) 100 UNIT/ML injection Inject 50 Units into the skin at bedtime.     Historical Provider, MD  meclizine (ANTIVERT) 25 MG tablet Take 25 mg by mouth 3 (three) times daily as needed for dizziness or nausea.     Historical Provider, MD  Menthol, Topical Analgesic, (BIOFREEZE) 4 % GEL Apply 1 application topically daily as needed (for arm pain).    Historical Provider, MD  Multiple Vitamins-Minerals (CENTRUM SILVER PO) Take 1 tablet by mouth daily.     Historical Provider, MD  pregabalin (LYRICA) 100 MG capsule Take 100 mg by mouth daily.     Historical Provider, MD  spironolactone (ALDACTONE) 50 MG tablet Take 1 tablet (50 mg total) by  mouth daily. 01/28/14   Orvil Feil, NP  triamcinolone cream (KENALOG) 0.1 % Apply 1 application topically daily as needed (for irritation).  06/26/13   Historical Provider, MD     Allergies:  Allergies  Allergen Reactions  . Morphine And Related Itching  . Prochlorperazine Edisylate Other (See Comments)    Causes anxiety/numbness  . Compazine [Prochlorperazine Edisylate] Anxiety    Causes anxiety & nervousness    Social History:   reports that she has never smoked. She does not have any smokeless tobacco history on file. She reports that she does not drink alcohol or use illicit drugs.  Family History: Family History  Problem Relation Age of Onset  . Coronary artery disease      FH  . Diabetes      FH  . Heart disease Mother   . Colon cancer Neg Hx      Physical Exam: Filed Vitals:   04/09/14 0300 04/09/14 0330 04/09/14 0400 04/09/14 0430  BP: 138/81 138/64 135/41 134/47  Pulse: 83 83    Temp:      TempSrc:      Resp: 26  15 23 17   Height:      Weight:      SpO2: 94% 96%     Blood pressure 134/47, pulse 83, temperature 98.3 F (36.8 C), temperature source Oral, resp. rate 17, height 5\' 8"  (1.727 m), weight 106.142 kg (234 lb), SpO2 96.00%.  GEN:  Pleasant  patient lying in the stretcher in no acute distress; cooperative with exam. PSYCH:  alert and oriented x4; does not appear anxious or depressed; affect is appropriate. HEENT: Mucous membranes pink and anicteric; PERRLA; EOM intact; no cervical lymphadenopathy nor thyromegaly or carotid bruit; no JVD; There were no stridor. Neck is very supple. Breasts:: Not examined CHEST WALL: No tenderness CHEST: Normal respiration, clear to auscultation bilaterally.  HEART: Regular rate and rhythm.  There are no murmur, rub, or gallops.   BACK: No kyphosis or scoliosis; no CVA tenderness ABDOMEN: soft , but tender in the epigastrium. no masses, no organomegaly, normal abdominal bowel sounds; no pannus; no intertriginous candida. There is no rebound and no distention. Rectal Exam: Not done EXTREMITIES: No bone or joint deformity; age-appropriate arthropathy of the hands and knees; no edema; no ulcerations.  There is no calf tenderness. Genitalia: not examined PULSES: 2+ and symmetric SKIN: Normal hydration no rash or ulceration CNS: Cranial nerves 2-12 grossly intact no focal lateralizing neurologic deficit.  Speech is fluent; uvula elevated with phonation, facial symmetry and tongue midline. DTR are normal bilaterally, cerebella exam is intact, barbinski is negative and strengths are equaled bilaterally.  No sensory loss.   Labs on Admission:  Basic Metabolic Panel:  Recent Labs Lab 04/09/14 0055  NA 140  K 4.3  CL 101  CO2 25  GLUCOSE 251*  BUN 18  CREATININE 0.78  CALCIUM 9.4   Liver Function Tests:  Recent Labs Lab 04/09/14 0055  AST 24  ALT 10  ALKPHOS 108  BILITOT 1.7*  PROT 6.6  ALBUMIN 3.6    Recent Labs Lab 04/09/14 0055  LIPASE 814*     Recent Labs Lab 04/09/14 0143  AMMONIA 51   CBC:  Recent Labs Lab 04/09/14 0055  WBC 7.6  NEUTROABS 6.8  HGB 12.1  HCT 37.0  MCV 77.2*  PLT 119*   Cardiac Enzymes: No results found for this basename: CKTOTAL, CKMB, CKMBINDEX, TROPONINI,  in the last 168 hours  CBG: No results  found for this basename: GLUCAP,  in the last 168 hours   Radiological Exams on Admission: Dg Chest Port 1 View  04/09/2014   CLINICAL DATA:  Chest pain  EXAM: PORTABLE CHEST - 1 VIEW  COMPARISON:  Portable exam 0108 hr compared to 08/30/2013.  FINDINGS: Enlargement of cardiac silhouette.  Slight pulmonary vascular congestion.  Mediastinal contours normal.  No acute failure or consolidation.  No pleural effusion or pneumothorax.  Bones demineralized.  IMPRESSION: Enlargement of cardiac silhouette with slight vascular congestion.  No acute abnormalities.   Electronically Signed   By: Lavonia Dana M.D.   On: 04/09/2014 02:01    Assessment/Plan Present on Admission:  . Pancreatitis . DIABETES, TYPE 2 . OBESITY . HYPERTENSION . GERD . CIRRHOSIS . Encephalopathy chronic . UTI (urinary tract infection) . Liver cirrhosis secondary to NASH   PLAN: Will admit her for acute pancreatitis of unclear etiology.  She has not been consuming alcohol, nor taking any meds which can cause pancreatitis, except perhaps Lasix, and has no evidence of biliary obtruction.  Will obtain a RUQ [place].  She will receive bowel rest and gentle IVF.  Will give her some IV pain meds as well.  For her recurrent UTI, will obtain culture, and place her on Rocephin.  For her DM, will reduce her insulin and use SSI correction.  For cirrhosis, will continue with her spironolactone and lactulose.  She is stable, full code, and will be admitted to [facility] service.  Thank you for asking to participate in her care.  Other plans as per orders.  Code Status: FULL CODE.   Orvan Falconer, MD. [facility] Pager [phone] 7pm to  7am.  04/09/2014, 6:02 AM

## 2014-04-09 NOTE — Care Management Utilization Note (Signed)
UR completed 

## 2014-04-09 NOTE — ED Notes (Signed)
sleeping

## 2014-04-09 NOTE — ED Notes (Signed)
Pt c/o sudden onset of itching. Rocephin stopped and dr Marin Comment notified.

## 2014-04-09 NOTE — Care Management Note (Addendum)
    Page 1 of 1   04/12/2014     2:07:56 PM CARE MANAGEMENT NOTE 04/12/2014  Patient:  DASHONDA, BONNEAU   Account Number:  [id number]  Date Initiated:  04/09/2014  Documentation initiated by:  Claretha Cooper  Subjective/Objective Assessment:   Pt lives at home with son. Requests HH RN PT and Aid from [facility]     Action/Plan:   Anticipated DC Date:     Anticipated DC Plan:  [place]  CM consult      Choice offered to / List presented to:          [facility] arranged  HH-1 RN  [place].   Status of service:  Completed, signed off Medicare Important Message given?  YES (If response is "NO", the following Medicare IM given date fields will be blank) Date Medicare IM given:  04/12/2014 Date Additional Medicare IM given:    Discharge Disposition:  [place]  Per UR Regulation:    If discussed at Long Length of Stay Meetings, dates discussed:    Comments:  04/09/14 Claretha Cooper RN CM

## 2014-04-09 NOTE — Progress Notes (Signed)
Inpatient Diabetes Program Recommendations  AACE/ADA: New Consensus Statement on Inpatient Glycemic Control (2013)  Target Ranges:  Prepandial:   less than 140 mg/dL      Peak postprandial:   less than 180 mg/dL (1-2 hours)      Critically ill patients:  140 - 180 mg/dL    Results for [name] [name], [name] [name] (MRN [id number]) as of 04/09/2014 08:05  Ref. Range 04/09/2014 07:27  Glucose-Capillary Latest Range: 70-99 mg/dL 271 (H)   Diabetes history: DM2 Outpatient Diabetes medications: Levemir 50 units QHS, Novolog 10-14 units PRN Current orders for Inpatient glycemic control: Lantus 20 units QHS, Novolog 0-15 units AC, Novolog 0-5 units HS  Inpatient Diabetes Program Recommendations Insulin - Basal: Patient uses Levemir for basal insulin as an outpatient. Please consider discontinuing Lantus 20 units QHS and order Levemir 30 units daily (starting now since patient did not receive any basal insulin last night and patient states that she did not take any Levemir last night before coming to the hospital). HgbA1C: Please consider ordering an A1C to evaluate glycemic control over the past 2-3 months.  Thanks, Barnie Alderman, RN, MSN, CCRN Diabetes Coordinator Inpatient Diabetes Program [phone] (Team Pager) [phone] ([facility] office) [phone] [facility] office)

## 2014-04-09 NOTE — Plan of Care (Signed)
Problem: Phase I Progression Outcomes Goal: Pain controlled with appropriate interventions Outcome: Completed/Met Date Met:  04/09/14 Pt denies pain Goal: OOB as tolerated unless otherwise ordered Outcome: Progressing To bedside comode Goal: Nausea/vomiting controlled after medication Outcome: Completed/Met Date Met:  04/09/14 Denies nausea

## 2014-04-09 NOTE — Progress Notes (Addendum)
[facility] PROGRESS NOTE  [name] APO:[id number] DOB: July 12, 1941 DOA: 04/09/2014 PCP: Purvis Kilts, MD I have seen and examined pt who is a 73 yo admitted this am by Dr  with history of NASH, liver cirrhosis, hx of hepatic encephalopathy on chronic lactullose, HTN, DM2, recurrent UTI on Keflex as suppressive therapy, presenting with one day hx of epigastric pain, nausea and vomiting and was found to have acute/recurrent pancreatitis and a UTI. On followup today she denies any further abdominal pain and has not had any nausea or vomiting. Will start on clear liquids. Will continue empiric antibiotics for UTI and follow up on urine cultures. Continue current management plan as per Dr. Marin Comment and follow. Husband updated by phone.     [facility] Pager [phone]. If 7PM-7AM, please contact night-coverage at [email or web address], password Ku [facility] 04/09/2014, 9:11 AM  LOS: 0 days

## 2014-04-09 NOTE — ED Notes (Addendum)
Also c/o nausea. Unwilling/unable to drink fluids. States one sip increased her pain and nausea

## 2014-04-09 NOTE — ED Provider Notes (Signed)
CSN: [id number]     Arrival date & time 04/08/14  2257 History   First MD Initiated Contact with Patient 04/09/14 0023 This chart was scribed for Johnna Acosta, MD by Anastasia Pall, ED Scribe. This patient was seen in room APA08/APA08 and the patient's care was started at 12:33 AM.     Chief Complaint  Patient presents with  . Abdominal Pain   (Consider location/radiation/quality/duration/timing/severity/associated sxs/prior Treatment) The history is provided by the patient. No language interpreter was used.   HPI Comments: [name] [name] is a 73 y.o. female who presents to the Emergency Department complaining of acute onset of sharp epigastric pain, onset a couple of hours ago shortly after eating spaghetti that radiates to mid back, thoracic lumbar. She denies pain prior to today. She reports associated bilateral LE swelling which has been chronic.  Pain is constant and not assocaited with vomiting.  Vomitted a large amount of stomach contents in the waiting room - non bloody / non bilious  She denies EtOH use, but has h/o non alcoholic cirrhosis. No past abdominal surgical history. Does have history pancreatitis. She denies cholecystectomy, gall bladder issues. She has h/o DM, but denies h/o HTN.   PCP - Purvis Kilts, MD  Past Medical History  Diagnosis Date  . CAD in native artery   . Thrombocytopenia, unspecified   . Unspecified hereditary and idiopathic peripheral neuropathy   . Unspecified fall   . Other and unspecified hyperlipidemia   . Unspecified essential hypertension   . Obesity, unspecified   . Shortness of breath   . Personal history of neurosis   . Intrinsic asthma, unspecified   . Allergic rhinitis, cause unspecified   . Cirrhosis of liver     NASH, afp on 06/17/12 =3.7, per pt she had hep B vaccines in 1996, hep A in process.   . Colon adenomas 03/08/10    tcs by Dr. Gala Romney  . Hyperplastic polyps of stomach 03/08/10    tcs by Dr. Dudley Major  . Chronic gastritis  03/09/11    egd by Dr. Gala Romney  . History of hemorrhoids 03/08/10    tcs- internal and external  . Diverticula of colon 03/08/10    L side  . Hiatal hernia 03/08/10  . Esophagitis, erosive 03/08/10  . GERD (gastroesophageal reflux disease)   . Wears glasses   . Type II or unspecified type diabetes mellitus without mention of complication, not stated as uncontrolled   . Vertigo   . Pancreatitis   . Bilateral leg weakness    Past Surgical History  Procedure Laterality Date  . Hysterectomy and btl      s/p  . Abdominal hysterectomy    . Tumor excision  2003    rt arm and left foot  . Colonoscopy  03/08/10    Dr. [name]-->ext/int hemorrhoids, anal paipilla, rectal polyps, desc polyps, cecal polyp, left-sided diverticula. suboptiomal prep. next TCS 02/2015. multiple adenomas  . Esophagogastroduodenoscopy  03/08/10    Dr. [name] erosive RE, small hh, antral erosions, small 1cm are of mucosal indentation along gastric body of doubtful significance, cystic nodularity of hypophyarynx, base of tongue, base of epiglottis. benign gastric biopsies  . Tubal ligation    . Esophagogastroduodenoscopy  10/08/2011    Dr. [name] esophageal varices, antral erosion. next egd 09/2013  . Foot surgery  2003    lt foot  . Eye surgery      cataracts bilateral  . Orif humerus fracture Right 02/17/2013  Procedure: OPEN REDUCTION INTERNAL FIXATION (ORIF) RIGHT PROXIMAL HUMERUS FRACTURE;  Surgeon: [name], MD;  Location: [facility];  Service: Orthopedics;  Laterality: Right;  . Partial gastrectomy      family denies  . Breast surgery    . Cardiac catheterization  02/25/2007    Dr. [name]:  normal LV systolic function, mild irregularities of LAD  . Esophagogastroduodenoscopy N/A 02/17/2014    Procedure: ESOPHAGOGASTRODUODENOSCOPY (EGD);  Surgeon: Daneil Dolin, MD;  [facility];  Service: Endoscopy;  Laterality: N/A;  10:30   Family History  Problem Relation Age of Onset  . Coronary artery  disease      FH  . Diabetes      FH  . Heart disease Mother   . Colon cancer Neg Hx    History  Substance Use Topics  . Smoking status: Never Smoker   . Smokeless tobacco: Not on file  . Alcohol Use: No   OB History   Grav Para Term Preterm Abortions TAB SAB Ect Mult Living                 Review of Systems  All other systems reviewed and are negative.   Allergies  Morphine and related; Prochlorperazine edisylate; and Compazine  Home Medications   Prior to Admission medications   Medication Sig Start Date End Date Taking? Authorizing Provider  ALPRAZolam Duanne Moron) 0.5 MG tablet Take 0.5 mg by mouth 3 (three) times daily as needed for anxiety. 08/09/12   Kathie Dike, MD  aspirin (BAYER LOW STRENGTH) 81 MG EC tablet Take 81 mg by mouth daily.      Historical Provider, MD  budesonide-formoterol (SYMBICORT) 160-4.5 MCG/ACT inhaler Inhale 2 puffs into the lungs 2 (two) times daily.      Historical Provider, MD  enalapril (VASOTEC) 5 MG tablet Take 5 mg by mouth daily.      Historical Provider, MD  esomeprazole (NEXIUM) 40 MG capsule Take 40 mg by mouth every morning.    Historical Provider, MD  ESTRACE VAGINAL 0.1 MG/GM vaginal cream Place vaginally every other day. At  bedtime 08/11/13   Historical Provider, MD  furosemide (LASIX) 20 MG tablet Take 1 tablet (20 mg total) by mouth daily. 01/28/14   Orvil Feil, NP  [facility] 10 GM/15ML SOLN Take 10 g by mouth 3 (three) times daily.  09/06/13   Historical Provider, MD  insulin aspart (NOVOLOG) 100 UNIT/ML injection Inject 10-14 Units into the skin daily as needed (90-150= 10 units 151-200= 11 units 201-250= 12 units 250-300= 13 units 301-350= 14 units as directed per sliding scale instructions). Per sliding scale. Patient injects 10-40 units for levels above 150 to 300    Historical Provider, MD  insulin detemir (LEVEMIR) 100 UNIT/ML injection Inject 50 Units into the skin at bedtime.     Historical Provider, MD  meclizine (ANTIVERT) 25  MG tablet Take 25 mg by mouth 3 (three) times daily as needed for dizziness or nausea.     Historical Provider, MD  Menthol, Topical Analgesic, (BIOFREEZE) 4 % GEL Apply 1 application topically daily as needed (for arm pain).    Historical Provider, MD  Multiple Vitamins-Minerals (CENTRUM SILVER PO) Take 1 tablet by mouth daily.     Historical Provider, MD  pregabalin (LYRICA) 100 MG capsule Take 100 mg by mouth daily.     Historical Provider, MD  spironolactone (ALDACTONE) 50 MG tablet Take 1 tablet (50 mg total) by mouth daily. 01/28/14   Vicente Males  Annie Sable, NP  triamcinolone cream (KENALOG) 0.1 % Apply 1 application topically daily as needed (for irritation).  06/26/13   Historical Provider, MD   BP 133/78  Pulse 63  Temp(Src) 98.3 F (36.8 C) (Oral)  Resp 20  Ht 5\' 8"  (1.727 m)  Wt 234 lb (106.142 kg)  BMI 35.59 kg/m2  SpO2 98% Physical Exam  Nursing note and vitals reviewed. Constitutional: She is oriented to person, place, and time. She appears well-developed and well-nourished. No distress.  HENT:  Head: Normocephalic and atraumatic.  Eyes: Conjunctivae and EOM are normal. Pupils are equal, round, and reactive to light. No scleral icterus.  Neck: Neck supple. No tracheal deviation present.  Cardiovascular: Normal rate.   Pulmonary/Chest: Effort normal. No respiratory distress.  Abdominal: Soft. There is tenderness. There is no guarding.  Significant severe reproducible pain in epigastric area. o peroteneal signs.  Musculoskeletal: Normal range of motion. She exhibits edema ( bialteral lower ext edema 1+).  Neurological: She is alert and oriented to person, place, and time.  Skin: Skin is warm and dry.  Psychiatric: She has a normal mood and affect. Her behavior is normal.    ED Course  Procedures (including critical care time)  DIAGNOSTIC STUDIES: Oxygen Saturation is 98% on room air, normal by my interpretation.    COORDINATION OF CARE: 12:38 AM-Discussed treatment plan with pt  at bedside and pt agreed to plan.   Labs Review Labs Reviewed  CBC WITH DIFFERENTIAL - Abnormal; Notable for the following:    MCV 77.2 (*)    MCH 25.3 (*)    Platelets 119 (*)    Neutrophils Relative % 89 (*)    Lymphocytes Relative 7 (*)    Lymphs Abs 0.5 (*)    All other components within normal limits  COMPREHENSIVE METABOLIC PANEL - Abnormal; Notable for the following:    Glucose, Bld 251 (*)    Total Bilirubin 1.7 (*)    GFR calc non Af Amer 83 (*)    All other components within normal limits  LIPASE, BLOOD - Abnormal; Notable for the following:    Lipase 814 (*)    All other components within normal limits  URINALYSIS, ROUTINE W REFLEX MICROSCOPIC - Abnormal; Notable for the following:    APPearance HAZY (*)    Specific Gravity, Urine >1.030 (*)    Hgb urine dipstick TRACE (*)    Ketones, ur TRACE (*)    Protein, ur TRACE (*)    Nitrite POSITIVE (*)    Leukocytes, UA MODERATE (*)    All other components within normal limits  URINE MICROSCOPIC-ADD ON - Abnormal; Notable for the following:    Squamous Epithelial / LPF MANY (*)    Bacteria, UA MANY (*)    All other components within normal limits  URINE CULTURE  AMMONIA    Imaging Review Dg Chest Port 1 View  04/09/2014   CLINICAL DATA:  Chest pain  EXAM: PORTABLE CHEST - 1 VIEW  COMPARISON:  Portable exam 0108 hr compared to 08/30/2013.  FINDINGS: Enlargement of cardiac silhouette.  Slight pulmonary vascular congestion.  Mediastinal contours normal.  No acute failure or consolidation.  No pleural effusion or pneumothorax.  Bones demineralized.  IMPRESSION: Enlargement of cardiac silhouette with slight vascular congestion.  No acute abnormalities.   Electronically Signed   By: Lavonia Dana M.D.   On: 04/09/2014 02:01     EKG Interpretation None     Medications  ondansetron (ZOFRAN) injection 4 mg (4  mg Intravenous Given 04/09/14 0111)  HYDROmorphone (DILAUDID) injection 0.5 mg (0.5 mg Intravenous Given 04/09/14 0111)   sodium chloride 0.9 % bolus 500 mL (0 mLs Intravenous Stopped 04/09/14 0137)  0.9 %  sodium chloride infusion (1,000 mLs Intravenous New Bag/Given 04/09/14 0106)   MDM   Final diagnoses:  Pancreatitis  Hyperglycemia  Thrombocytopenia    The patient appears uncomfortable, her vital signs however are normal. She does not have a surgical abdomen and in fact has no tenderness outside of her epigastrium. This is possibly pancreatitis, but also consider biliary source, less likely to be a perforation, volvulus or bowel obstruction given the patient's nonsurgical history. She is in normal sinus rhythm, no atrial fibrillation. Will provide pain medication, nausea medication, fluids and laboratory workup to evaluate for possible etiology of patient's symptoms.  Though the patient improved in pain with Dilaudid and antiemetic she continued to have nausea and was unable to tolerate clear liquids without significant nausea. The patient will be admitted for observation and ongoing treatment of her gastrointestinal illness.   Meds given in ED:  Medications  ondansetron (ZOFRAN) injection 4 mg (4 mg Intravenous Given 04/09/14 0111)  HYDROmorphone (DILAUDID) injection 0.5 mg (0.5 mg Intravenous Given 04/09/14 0111)  sodium chloride 0.9 % bolus 500 mL (0 mLs Intravenous Stopped 04/09/14 0137)  0.9 %  sodium chloride infusion (1,000 mLs Intravenous New Bag/Given 04/09/14 0106)      I personally performed the services described in this documentation, which was scribed in my presence. The recorded information has been reviewed and is accurate.      Johnna Acosta, MD 04/09/14 [phone]

## 2014-04-09 NOTE — Progress Notes (Signed)
Patient arrived to the floor accompanied by ER nurse. ER nurse informed me that the patient c/o itching. Stated she stopped the rocephin and informed Dr. Marin Comment. Will continue to monitor closely.

## 2014-04-10 DIAGNOSIS — I10 Essential (primary) hypertension: Secondary | ICD-10-CM

## 2014-04-10 DIAGNOSIS — D649 Anemia, unspecified: Secondary | ICD-10-CM

## 2014-04-10 DIAGNOSIS — E119 Type 2 diabetes mellitus without complications: Secondary | ICD-10-CM

## 2014-04-10 DIAGNOSIS — N390 Urinary tract infection, site not specified: Secondary | ICD-10-CM

## 2014-04-10 LAB — COMPREHENSIVE METABOLIC PANEL
ALT: 22 U/L (ref 0–35)
AST: 34 U/L (ref 0–37)
Albumin: 2.7 g/dL — ABNORMAL LOW (ref 3.5–5.2)
Alkaline Phosphatase: 88 U/L (ref 39–117)
BILIRUBIN TOTAL: 1.5 mg/dL — AB (ref 0.3–1.2)
BUN: 15 mg/dL (ref 6–23)
CO2: 25 mEq/L (ref 19–32)
Calcium: 7.7 mg/dL — ABNORMAL LOW (ref 8.4–10.5)
Chloride: 105 mEq/L (ref 96–112)
Creatinine, Ser: 0.64 mg/dL (ref 0.50–1.10)
GFR calc non Af Amer: 88 mL/min — ABNORMAL LOW (ref >90)
GLUCOSE: 205 mg/dL — AB (ref 70–99)
Potassium: 4 mEq/L (ref 3.7–5.3)
Sodium: 138 mEq/L (ref 137–147)
TOTAL PROTEIN: 5 g/dL — AB (ref 6.0–8.3)

## 2014-04-10 LAB — CBC
HEMATOCRIT: 30.1 % — AB (ref 36.0–46.0)
Hemoglobin: 9.8 g/dL — ABNORMAL LOW (ref 12.0–15.0)
MCH: 25.6 pg — ABNORMAL LOW (ref 26.0–34.0)
MCHC: 32.6 g/dL (ref 30.0–36.0)
MCV: 78.6 fL (ref 78.0–100.0)
Platelets: 93 10*3/uL — ABNORMAL LOW (ref 150–400)
RBC: 3.83 MIL/uL — ABNORMAL LOW (ref 3.87–5.11)
RDW: 15.1 % (ref 11.5–15.5)
WBC: 3 10*3/uL — ABNORMAL LOW (ref 4.0–10.5)

## 2014-04-10 LAB — PROTIME-INR
INR: 1.2 (ref 0.00–1.49)
PROTHROMBIN TIME: 14.9 s (ref 11.6–15.2)

## 2014-04-10 LAB — HEMOGLOBIN AND HEMATOCRIT, BLOOD
HCT: 29.6 % — ABNORMAL LOW (ref 36.0–46.0)
HEMATOCRIT: 32.2 % — AB (ref 36.0–46.0)
HEMATOCRIT: 30.1 % — AB (ref 36.0–46.0)
HEMOGLOBIN: 10.1 g/dL — AB (ref 12.0–15.0)
Hemoglobin: 9.9 g/dL — ABNORMAL LOW (ref 12.0–15.0)
Hemoglobin: 10.6 g/dL — ABNORMAL LOW (ref 12.0–15.0)

## 2014-04-10 LAB — TYPE AND SCREEN
ABO/RH(D): A NEG
Antibody Screen: NEGATIVE

## 2014-04-10 LAB — GLUCOSE, CAPILLARY
GLUCOSE-CAPILLARY: 210 mg/dL — AB (ref 70–99)
Glucose-Capillary: 186 mg/dL — ABNORMAL HIGH (ref 70–99)
Glucose-Capillary: 188 mg/dL — ABNORMAL HIGH (ref 70–99)

## 2014-04-10 LAB — LIPASE, BLOOD: LIPASE: 121 U/L — AB (ref 11–59)

## 2014-04-10 MED ORDER — PANTOPRAZOLE SODIUM 40 MG IV SOLR
INTRAVENOUS | Status: AC
  Filled 2014-04-10: qty 80

## 2014-04-10 MED ORDER — SODIUM CHLORIDE 0.9 % IV SOLN
8.0000 mg/h | INTRAVENOUS | Status: DC
  Administered 2014-04-10 (×2): 8 mg/h via INTRAVENOUS
  Filled 2014-04-10 (×6): qty 80

## 2014-04-10 MED ORDER — SODIUM CHLORIDE 0.9 % IV SOLN
80.0000 mg | Freq: Once | INTRAVENOUS | Status: AC
  Administered 2014-04-10: 80 mg via INTRAVENOUS
  Filled 2014-04-10: qty 80

## 2014-04-10 MED ORDER — PANTOPRAZOLE SODIUM 40 MG IV SOLR: 40.0000 mg | Freq: Two times a day (BID) | INTRAVENOUS | Status: DC

## 2014-04-10 NOTE — Progress Notes (Signed)
Dr. Marin Comment notified of  Hgb 9.8 and Hct 30.1, see new orders, patient resting  In bed with no complaints or discomfort voiced

## 2014-04-10 NOTE — Progress Notes (Signed)
[facility] PROGRESS NOTE  DAI APEL CBJ:[id number] DOB: 04-21-1941 DOA: 04/09/2014 PCP: Purvis Kilts, MD  Assessment/Plan: 1. Urinary tract infection. Patient is currently on Rocephin. Urine culture is currently in process. 2. Recurrent pancreatitis. Patient denies any alcohol use. Her RUQ ultrasound is currently in process. She may need surgical input since that she does have recurrent pancreatitis. We'll continue with supportive treatment, advance diet as tolerated. Continue pain management. 3. Diabetes. Sliding scale insulin. Blood sugars appear to be stable. 4. History of NASH cirrhosis. Continue on lactulose and Aldactone. No evidence of hepatic encephalopathy at this time. 5. Anemia. I suspect this is related to chronic disease/dilutional. She does not have any evidence of bleeding. Hemoglobin has been stable. Continue Protonix.  Code Status: full code Family Communication: discussed with patient and husband Disposition Plan: discharge home once improved   Consultants:    Procedures:    Antibiotics:  Rocephin 5/29  HPI/Subjective: Patient had a bowel movement today.  Continues to complain of of abdominal pain  Objective: Filed Vitals:   04/10/14 0610  BP: 153/84  Pulse: 77  Temp: 98.2 F (36.8 C)  Resp: 20    Intake/Output Summary (Last 24 hours) at 04/10/14 1824 Last data filed at 04/10/14 1441  Gross per 24 hour  Intake    240 ml  Output   1200 ml  Net   -960 ml   Filed Weights   04/08/14 2308  Weight: 106.142 kg (234 lb)    Exam:   General:  NAD  Cardiovascular: s1, s2, rrr  Respiratory: CTA B  Abdomen: soft, obese, tender in upper abdomen, bs+  Musculoskeletal: no edema b/l   Data Reviewed: Basic Metabolic Panel:  Recent Labs Lab 04/09/14 0055 04/10/14 0442  NA 140 138  K 4.3 4.0  CL 101 105  CO2 25 25  GLUCOSE 251* 205*  BUN 18 15  CREATININE 0.78 0.64  CALCIUM 9.4 7.7*   Liver Function Tests:  Recent  Labs Lab 04/09/14 0055 04/10/14 0442  AST 24 34  ALT 10 22  ALKPHOS 108 88  BILITOT 1.7* 1.5*  PROT 6.6 5.0*  ALBUMIN 3.6 2.7*    Recent Labs Lab 04/09/14 0055 04/10/14 0442  LIPASE 814* 121*    Recent Labs Lab 04/09/14 0143  AMMONIA 51   CBC:  Recent Labs Lab 04/09/14 0055 04/10/14 0442 04/10/14 0703 04/10/14 1422  WBC 7.6 3.0*  --   --   NEUTROABS 6.8  --   --   --   HGB 12.1 9.8* 9.9* 10.6*  HCT 37.0 30.1* 29.6* 32.2*  MCV 77.2* 78.6  --   --   PLT 119* 93*  --   --    Cardiac Enzymes: No results found for this basename: CKTOTAL, CKMB, CKMBINDEX, TROPONINI,  in the last 168 hours BNP (last 3 results) No results found for this basename: PROBNP,  in the last 8760 hours CBG:  Recent Labs Lab 04/09/14 1613 04/09/14 2059 04/10/14 0723 04/10/14 1141 04/10/14 1641  GLUCAP 203* 215* 186* 210* 188*    No results found for this or any previous visit (from the past 240 hour(s)).   Studies: Dg Chest Port 1 View  04/09/2014   CLINICAL DATA:  Chest pain  EXAM: PORTABLE CHEST - 1 VIEW  COMPARISON:  Portable exam 0108 hr compared to 08/30/2013.  FINDINGS: Enlargement of cardiac silhouette.  Slight pulmonary vascular congestion.  Mediastinal contours normal.  No acute failure or consolidation.  No pleural effusion or  pneumothorax.  Bones demineralized.  IMPRESSION: Enlargement of cardiac silhouette with slight vascular congestion.  No acute abnormalities.   Electronically Signed   By: Lavonia Dana M.D.   On: 04/09/2014 02:01    Scheduled Meds: . cefTRIAXone (ROCEPHIN)  IV  1 g Intravenous Q24H  . conjugated estrogens  1 Applicatorful Vaginal O03O  . enalapril  5 mg Oral Daily  . heparin  5,000 Units Subcutaneous 3 times per day  . insulin aspart  0-5 Units Subcutaneous QHS  . insulin aspart  0-9 Units Subcutaneous TID WC  . insulin glargine  20 Units Subcutaneous QHS  . lactulose  10 g Oral TID  . mometasone-formoterol  2 puff Inhalation BID  . [START ON  04/13/2014] pantoprazole (PROTONIX) IV  40 mg Intravenous Q12H  . pregabalin  100 mg Oral Daily  . spironolactone  50 mg Oral Daily   Continuous Infusions: . sodium chloride 100 mL/hr at 04/10/14 1533    Principal Problem:   Pancreatitis Active Problems:   DIABETES, TYPE 2   OBESITY   HYPERTENSION   GERD   CIRRHOSIS   Encephalopathy chronic   UTI (urinary tract infection)   Liver cirrhosis secondary to NASH    Time spent: 21mins    Jehanzeb Memon  [facility] Pager [phone]. If 7PM-7AM, please contact night-coverage at [email or web address], password [facility] 04/10/2014, 6:24 PM  LOS: 1 day

## 2014-04-10 NOTE — Progress Notes (Addendum)
Patient Hb this morning was 9.8 grams per dL.  Yesterday it was 12 grams.  She does not have any melena nor BRBPR.  Her stool has been normal according to RN.  She has no complaint of epigastric pain now.  Her vitals were stable.  Not sure where her anemia came from.  ?Plebotomy and hemodilution?  WIll repeat H/H stat, type and screen, and d/c ASA.   Start PPI drip, check INR and follow H/H q 8 hours.  Note she had EGD about a month ago (Dr Sydell Axon) with no evidence of esophageal varices nor ulcer.

## 2014-04-10 NOTE — Plan of Care (Signed)
Problem: Phase II Progression Outcomes Goal: Progress activity as tolerated unless otherwise ordered Outcome: Completed/Met Date Met:  04/10/14 At baseline Goal: Nausea/vomiting controlled with medication Outcome: Completed/Met Date Met:  04/10/14 No nausea

## 2014-04-11 ENCOUNTER — Inpatient Hospital Stay (HOSPITAL_COMMUNITY): Payer: Medicare Other

## 2014-04-11 DIAGNOSIS — D696 Thrombocytopenia, unspecified: Secondary | ICD-10-CM

## 2014-04-11 LAB — CBC
HCT: 30.3 % — ABNORMAL LOW (ref 36.0–46.0)
Hemoglobin: 10 g/dL — ABNORMAL LOW (ref 12.0–15.0)
MCH: 25.9 pg — AB (ref 26.0–34.0)
MCHC: 33 g/dL (ref 30.0–36.0)
MCV: 78.5 fL (ref 78.0–100.0)
Platelets: 87 10*3/uL — ABNORMAL LOW (ref 150–400)
RBC: 3.86 MIL/uL — AB (ref 3.87–5.11)
RDW: 14.8 % (ref 11.5–15.5)
WBC: 2.7 10*3/uL — ABNORMAL LOW (ref 4.0–10.5)

## 2014-04-11 LAB — URINE CULTURE: Colony Count: >=100000

## 2014-04-11 LAB — GLUCOSE, CAPILLARY
GLUCOSE-CAPILLARY: 181 mg/dL — AB (ref 70–99)
GLUCOSE-CAPILLARY: 187 mg/dL — AB (ref 70–99)
Glucose-Capillary: 181 mg/dL — ABNORMAL HIGH (ref 70–99)
Glucose-Capillary: 176 mg/dL — ABNORMAL HIGH (ref 70–99)
Glucose-Capillary: 168 mg/dL — ABNORMAL HIGH (ref 70–99)

## 2014-04-11 LAB — HEMOGLOBIN AND HEMATOCRIT, BLOOD
HCT: 31.9 % — ABNORMAL LOW (ref 36.0–46.0)
HCT: 31.6 % — ABNORMAL LOW (ref 36.0–46.0)
HEMOGLOBIN: 10.8 g/dL — AB (ref 12.0–15.0)
Hemoglobin: 10.6 g/dL — ABNORMAL LOW (ref 12.0–15.0)

## 2014-04-11 LAB — BASIC METABOLIC PANEL
BUN: 10 mg/dL (ref 6–23)
CALCIUM: 8 mg/dL — AB (ref 8.4–10.5)
CO2: 24 mEq/L (ref 19–32)
Chloride: 104 mEq/L (ref 96–112)
Creatinine, Ser: 0.61 mg/dL (ref 0.50–1.10)
GFR calc Af Amer: >90 mL/min (ref >90)
GFR calc non Af Amer: 90 mL/min — ABNORMAL LOW (ref >90)
Glucose, Bld: 187 mg/dL — ABNORMAL HIGH (ref 70–99)
Potassium: 3.9 mEq/L (ref 3.7–5.3)
SODIUM: 140 meq/L (ref 137–147)

## 2014-04-11 NOTE — Progress Notes (Signed)
[facility] PROGRESS NOTE  [name] [name] [id number]:[id number] DOB: Jan 15, 1941 DOA: 04/09/2014 PCP: Purvis Kilts, MD  Assessment/Plan: 1. Urinary tract infection. Patient is currently on Rocephin. Urine culture shows non specific growth. Since patient appeared to be symptomatic, would treat for 5 days. 2. Recurrent pancreatitis. Patient denies any alcohol use. Her RUQ ultrasound shows cholelithiasis. I have discussed the case with general surgery, and due to her history of NASH cirrhosis, GI consultation has been requested . She will likely need a cholecystectomy for definitive treatment. We'll continue with supportive treatment, advance diet as tolerated. Continue pain management. 3. Diabetes. Sliding scale insulin. Blood sugars appear to be stable. 4. History of NASH cirrhosis. Continue on lactulose and Aldactone. No evidence of hepatic encephalopathy at this time. Ammonia level on admission was normal 5. Anemia. I suspect this is related to chronic disease/dilutional. She does not have any evidence of bleeding. Hemoglobin has been stable. Continue Protonix. 6. Thrombocytopenia.  Related to cirrhosis. Stable.  Code Status: full code Family Communication: discussed with patient and husband Disposition Plan: discharge home once improved   Consultants:  Gastroenterology  Procedures:    Antibiotics:  Rocephin 5/29  HPI/Subjective: Abdominal pain improving, no nausea or vomiting  Objective: Filed Vitals:   04/11/14 1517  BP: 116/64  Pulse: 69  Temp: 98.5 F (36.9 C)  Resp: 20    Intake/Output Summary (Last 24 hours) at 04/11/14 1805 Last data filed at 04/11/14 1453  Gross per 24 hour  Intake    220 ml  Output   1300 ml  Net  -1080 ml   Filed Weights   04/08/14 2308  Weight: 106.142 kg (234 lb)    Exam:   General:  NAD  Cardiovascular: s1, s2, rrr  Respiratory: CTA B  Abdomen: soft, obese, nontender, bs+  Musculoskeletal: no edema b/l   Data  Reviewed: Basic Metabolic Panel:  Recent Labs Lab 04/09/14 0055 04/10/14 0442 04/11/14 0542  NA 140 138 140  K 4.3 4.0 3.9  CL 101 105 104  CO2 25 25 24   GLUCOSE 251* 205* 187*  BUN 18 15 10   CREATININE 0.78 0.64 0.61  CALCIUM 9.4 7.7* 8.0*   Liver Function Tests:  Recent Labs Lab 04/09/14 0055 04/10/14 0442  AST 24 34  ALT 10 22  ALKPHOS 108 88  BILITOT 1.7* 1.5*  PROT 6.6 5.0*  ALBUMIN 3.6 2.7*    Recent Labs Lab 04/09/14 0055 04/10/14 0442  LIPASE 814* 121*    Recent Labs Lab 04/09/14 0143  AMMONIA 51   CBC:  Recent Labs Lab 04/09/14 0055 04/10/14 0442 04/10/14 0703 04/10/14 1422 04/10/14 2232 04/11/14 0542 04/11/14 1403  WBC 7.6 3.0*  --   --   --  2.7*  --   NEUTROABS 6.8  --   --   --   --   --   --   HGB 12.1 9.8* 9.9* 10.6* 10.1* 10.0* 10.6*  HCT 37.0 30.1* 29.6* 32.2* 30.1* 30.3* 31.9*  MCV 77.2* 78.6  --   --   --  78.5  --   PLT 119* 93*  --   --   --  87*  --    Cardiac Enzymes: No results found for this basename: CKTOTAL, CKMB, CKMBINDEX, TROPONINI,  in the last 168 hours BNP (last 3 results) No results found for this basename: PROBNP,  in the last 8760 hours CBG:  Recent Labs Lab 04/10/14 1641 04/10/14 2124 04/11/14 0730 04/11/14 1125 04/11/14 1630  GLUCAP  188* 181* 181* 176* 187*    Recent Results (from the past 240 hour(s))  URINE CULTURE     Status: None   Collection Time    04/09/14  5:50 AM      Result Value Ref Range Status   Specimen Description URINE, CLEAN CATCH   Final   Special Requests NONE   Final   Culture  Setup Time     Final   Value: 04/10/2014 00:16     Performed at [facility] Count     Final   Value: >=100,000 COLONIES/ML     Performed at [facility]   Culture     Final   Value: Multiple bacterial morphotypes present, none predominant. Suggest appropriate recollection if clinically indicated.     Performed at [facility]   Report Status 2014-05-09 FINAL    Final     Studies: US Abdomen Limited Ruq  2014-05-09   CLINICAL DATA:  Pancreatitis  EXAM: US ABDOMEN LIMITED - RIGHT UPPER QUADRANT  COMPARISON:  None.  FINDINGS: Gallbladder:  There are cholelithiasis. No pericholecystic fluid or gallbladder wall thickening. Negative sonographic Murphy sign.  Common bile duct:  Diameter: Prominent caliber common bile duct measuring up to 7.6 mm which is likely within normal limits given the patient's age.  Liver:  No focal hepatic lesion. Increased hepatic echogenicity. There is a micronodular contour or to the liver surface as can be seen with cirrhosis. No perihepatic ascites.  IMPRESSION: 1. Cholelithiasis. 2. Cirrhotic liver.   Electronically Signed   By: Kathreen Devoid   On: 05/09/14 12:43    Scheduled Meds: . cefTRIAXone (ROCEPHIN)  IV  1 g Intravenous Q24H  . conjugated estrogens  1 Applicatorful Vaginal W10X  . enalapril  5 mg Oral Daily  . heparin  5,000 Units Subcutaneous 3 times per day  . insulin aspart  0-5 Units Subcutaneous QHS  . insulin aspart  0-9 Units Subcutaneous TID WC  . insulin glargine  20 Units Subcutaneous QHS  . lactulose  10 g Oral TID  . mometasone-formoterol  2 puff Inhalation BID  . [START ON 04/13/2014] pantoprazole (PROTONIX) IV  40 mg Intravenous Q12H  . pregabalin  100 mg Oral Daily  . spironolactone  50 mg Oral Daily   Continuous Infusions:    Principal Problem:   Pancreatitis Active Problems:   DIABETES, TYPE 2   OBESITY   HYPERTENSION   GERD   CIRRHOSIS   Encephalopathy chronic   UTI (urinary tract infection)   Liver cirrhosis secondary to NASH    Time spent: 69mins    Tamel Abel  [facility] Pager [phone]. If 7PM-7AM, please contact night-coverage at [email or web address], password [facility] 09-May-2014, 6:05 PM  LOS: 2 days

## 2014-04-12 ENCOUNTER — Telehealth: Payer: Self-pay | Admitting: Gastroenterology

## 2014-04-12 ENCOUNTER — Encounter (HOSPITAL_COMMUNITY): Payer: Self-pay | Admitting: Gastroenterology

## 2014-04-12 DIAGNOSIS — K859 Acute pancreatitis without necrosis or infection, unspecified: Secondary | ICD-10-CM | POA: Diagnosis present

## 2014-04-12 DIAGNOSIS — D696 Thrombocytopenia, unspecified: Secondary | ICD-10-CM

## 2014-04-12 DIAGNOSIS — D61818 Other pancytopenia: Secondary | ICD-10-CM | POA: Diagnosis present

## 2014-04-12 HISTORY — DX: Other pancytopenia: D61.818

## 2014-04-12 HISTORY — DX: Thrombocytopenia, unspecified: D69.6

## 2014-04-12 LAB — COMPREHENSIVE METABOLIC PANEL
ALT: 13 U/L (ref 0–35)
AST: 16 U/L (ref 0–37)
Albumin: 3 g/dL — ABNORMAL LOW (ref 3.5–5.2)
Alkaline Phosphatase: 87 U/L (ref 39–117)
BILIRUBIN TOTAL: 1.1 mg/dL (ref 0.3–1.2)
BUN: 9 mg/dL (ref 6–23)
CHLORIDE: 103 meq/L (ref 96–112)
CO2: 25 meq/L (ref 19–32)
CREATININE: 0.57 mg/dL (ref 0.50–1.10)
Calcium: 8 mg/dL — ABNORMAL LOW (ref 8.4–10.5)
GLUCOSE: 194 mg/dL — AB (ref 70–99)
Potassium: 3.6 mEq/L — ABNORMAL LOW (ref 3.7–5.3)
Sodium: 139 mEq/L (ref 137–147)
Total Protein: 5.5 g/dL — ABNORMAL LOW (ref 6.0–8.3)

## 2014-04-12 LAB — GLUCOSE, CAPILLARY
GLUCOSE-CAPILLARY: 182 mg/dL — AB (ref 70–99)
Glucose-Capillary: 184 mg/dL — ABNORMAL HIGH (ref 70–99)

## 2014-04-12 LAB — CBC
HCT: 29.8 % — ABNORMAL LOW (ref 36.0–46.0)
Hemoglobin: 10 g/dL — ABNORMAL LOW (ref 12.0–15.0)
MCH: 26.2 pg (ref 26.0–34.0)
MCHC: 33.6 g/dL (ref 30.0–36.0)
MCV: 78.2 fL (ref 78.0–100.0)
Platelets: 77 10*3/uL — ABNORMAL LOW (ref 150–400)
RBC: 3.81 MIL/uL — ABNORMAL LOW (ref 3.87–5.11)
RDW: 15.1 % (ref 11.5–15.5)
WBC: 2.5 10*3/uL — AB (ref 4.0–10.5)

## 2014-04-12 LAB — LIPASE, BLOOD: LIPASE: 24 U/L (ref 11–59)

## 2014-04-12 LAB — HEMOGLOBIN AND HEMATOCRIT, BLOOD
HCT: 29.7 % — ABNORMAL LOW (ref 36.0–46.0)
HEMOGLOBIN: 9.9 g/dL — AB (ref 12.0–15.0)

## 2014-04-12 MED ORDER — CEFUROXIME AXETIL 250 MG PO TABS
250.0000 mg | ORAL_TABLET | Freq: Two times a day (BID) | ORAL | Status: DC
Start: 2014-04-12 — End: 2014-06-26

## 2014-04-12 MED ORDER — PANTOPRAZOLE SODIUM 40 MG PO TBEC
40.0000 mg | DELAYED_RELEASE_TABLET | Freq: Two times a day (BID) | ORAL | Status: DC
  Administered 2014-04-12: 40 mg via ORAL
  Filled 2014-04-12: qty 1

## 2014-04-12 MED ORDER — HYDROMORPHONE HCL PF 1 MG/ML IJ SOLN: 0.5000 mg | INTRAMUSCULAR | Status: DC | PRN

## 2014-04-12 MED ORDER — OXYCODONE HCL 5 MG PO TABS
5.0000 mg | ORAL_TABLET | ORAL | Status: DC | PRN
  Administered 2014-04-12: 5 mg via ORAL
  Filled 2014-04-12: qty 1

## 2014-04-12 NOTE — Progress Notes (Signed)
AVS reviewed with patient and patient's husband.  Verbalized understanding of discharge instructions, physician follow-up and medications.  Teach back method used.  Prescription for antibiotic given to patient's husband.  Patient's IV removed.  Site WNL.  Reports all belongings intact and in possession at time of discharge.  Patient transported by NT via w/ to main entrance for discharge.  Patient stable at time of discharge.

## 2014-04-12 NOTE — Progress Notes (Signed)
The patient is receiving Protonix by the intravenous route.  Based on criteria approved by the Pharmacy and [place], the medication is being converted to the equivalent oral dose form.  These criteria include: -No Active GI bleeding -Able to tolerate diet of full liquids (or better) or tube feeding OR able to tolerate other medications by the oral or enteral route  If you have any questions about this conversion, please contact the Pharmacy Department (ext [phone]).  Thank you.  [place], [facility] 04/12/2014 8:55 AM

## 2014-04-12 NOTE — Discharge Summary (Signed)
Physician Discharge Summary  [name] [name] WCB:[id number] DOB: [birth year] DOA: 04/09/2014  PCP: Purvis Kilts, MD  Admit date: 04/09/2014 Discharge date: 04/12/2014  Time spent: Greater than 30 minutes  Recommendations for Outpatient Follow-up:  1. Outpatient followup for EUS and general surgery consultation being arranged by gastroenterology.  2. Recommend ongoing monitoring of the patient's CBC because of pancytopenia from cirrhosis.   Discharge Diagnoses:    1. Acute pancreatitis, recurrent, likely secondary to cholelithiasis (biliary pancreatitis). Lipase 814 on admission and 24 at the time of discharge. 2. Cholelithiasis. 3. [place] cirrhosis. 4. Urinary tract infection. 5. Diabetes mellitus, type II. 6. Obesity. 7. 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 At the time of discharge, her WBC was 2.5, hemoglobin 10.0, and platelet count 77. 8. Worsening anemia, likely secondary to hemodilution from IV fluids. No evidence of acute blood loss. 9. Hypertension. 10. Portal gastropathy per EGD April 2015.  Discharge Condition: Improved.  Diet recommendation: Carbohydrate modified.  Filed Weights   04/08/14 2308  Weight: 106.142 kg (234 lb)    History of present illness:   MADALEN [name] is an 73 y.o. female with hx of NASH, liver cirrhosis, hx of hepatic encephalopathy on chronic lactullose, HTN, DM2, recurrent UTI on Keflex as suppressive therapy, who presented to the ER with a one day hx of epigastric pain, nausea and vomiting. She has had no fever, chills, black or bloody stools. Though she has frequent loose stools on lactulose, she has had no frank diarrhea. Evalatuion in the ER included a lipase of 814, with no elevated LFTs, total bili was 1.7, and no leukocytosis. Her amonia level was normal. She was given antiemetics, but was not able to tolerate oral intake, so the hospitalist was asked to admit her for non alcoholic pancreatitis, and possilby UTI also.   Hospital  Course:   1. Acute pancreatitis, likely biliary pancreatitis. The patient was made to be n.p.o. She was started on IV fluid hydration and as needed IV opiate analgesic for pain. Most of her oral medications were withheld. For further evaluation, and ultrasound of the abdomen was ordered and her lipase was monitored daily. Her ultrasound revealed cholelithiasis and a cirrhotic liver, but no peri-cholecystic fluid or gallbladder wall thickening negative sonographic Murphy's sign. Given this finding, general surgeon Dr. Arnoldo Morale was consulted. However, he recommended obtaining a GI consultation and evaluation in light of her [name] cirrhosis. In the meantime, the patient's lipase decreased and then eventually normalized. Her diet was advanced which she tolerated well. Gastroenterology evaluated the patient and thought that she was stable for discharge. They will arrange for/schedule an outpatient EUS and outpatient followup with Dr. Arnoldo Morale. She will be seen back in Dr. Gala Romney office in 4-6 weeks. The patient was asymptomatic at the time of discharge.    2. [name] cirrhosis. Apparently she has been unable to afford Xifaxan. She was restarted on lactulose. Lasix and Aldactone were restarted upon discharge. Further management per GI.   3. Possible urinary tract infection. The patient has a history of chronic urinary tract infections, on suppressive therapy with Keflex per history. She was started on Rocephin empirically for what appeared to be an infected urine. Her urine culture grew out greater than 100,000 colonies of multiple bacteria, none predominant. In the setting of frequent urinary tract infections, she was discharged on several more days of Ceftin. She was afebrile and asymptomatic at the time of discharge.  4. Diabetes mellitus. She was restarted on sliding scale NovoLog and Lantus, but the  doses were decreased due to her initial n.p.o. status. Her blood glucose was fairly well controlled. She was  discharged on her home regimen.  5. Pancytopenia (leukopenia, thrombocytopenia, and anemia). The patient has long-standing pancytopenia, presumably secondary to [place] cirrhosis. Her hemoglobin was 12.1 on admission, but this was likely hemoconcentration. With IV fluid hydration, her hemoglobin fell to a nadir of 9.8, but stabilized at 10.0. Her platelet count and WBC also fell precipitously with IV fluids, but stabilized to her usual range. Further monitoring is recommended in the outpatient setting.  6. All the patient's other medical conditions remained stable.   Procedures:  None   Consultations:  Gastroenterology   Curbside consultation with general surgery  Discharge Exam: Filed Vitals:   04/12/14 1446  BP: 108/55  Pulse: 68  Temp: 98.9 F (37.2 C)  Resp: 20    General: Pleasant alert 73 year old in no acute distress.  Cardiovascular: S1, S2, with a soft systolic murmur.  Respiratory:Clear to auscultation bilaterally with decreased breath sounds in the bases.   abdomen: Obese, positive bowel sounds, soft, nontender, nondistended.  Discharge Instructions You were cared for by a hospitalist during your hospital stay. If you have any questions about your discharge medications or the care you received while you were in the hospital after you are discharged, you can call the unit and asked to speak with the hospitalist on call if the hospitalist that took care of you is not available. Once you are discharged, your primary care physician will handle any further medical issues. Please note that NO REFILLS for any discharge medications will be authorized once you are discharged, as it is imperative that you return to your primary care physician (or establish a relationship with a primary care physician if you do not have one) for your aftercare needs so that they can reassess your need for medications and monitor your lab values.      Discharge Instructions   Diet Carb Modified     Complete by:  As directed      Discharge instructions    Complete by:  As directed   Avoid fried and fatty foods; gravy, and other high fat condiments.     Increase activity slowly    Complete by:  As directed             Medication List         ALPRAZolam 0.5 MG tablet  Commonly known as:  XANAX  Take 0.5 mg by mouth 3 (three) times daily as needed for anxiety.     BAYER LOW STRENGTH 81 MG EC tablet  Generic drug:  aspirin  Take 81 mg by mouth daily.     BIOFREEZE 4 % Gel  Generic drug:  Menthol (Topical Analgesic)  Apply 1 application topically daily as needed (for arm pain).     budesonide-formoterol 160-4.5 MCG/ACT inhaler  Commonly known as:  SYMBICORT  Inhale 2 puffs into the lungs 2 (two) times daily.     cefUROXime 250 MG tablet  Commonly known as:  CEFTIN  Take 1 tablet (250 mg total) by mouth 2 (two) times daily with a meal. Antibiotic.     CENTRUM SILVER PO  Take 1 tablet by mouth daily.     enalapril 5 MG tablet  Commonly known as:  VASOTEC  Take 5 mg by mouth daily.     esomeprazole 40 MG capsule  Commonly known as:  NEXIUM  Take 40 mg by mouth every morning.  ESTRACE VAGINAL 0.1 MG/GM vaginal cream  Generic drug:  estradiol  Place vaginally every other day. At  bedtime     furosemide 20 MG tablet  Commonly known as:  LASIX  Take 1 tablet (20 mg total) by mouth daily.     GENERLAC 10 GM/15ML Soln  Generic drug:  lactulose (encephalopathy)  Take 10 g by mouth 3 (three) times daily.     insulin aspart 100 UNIT/ML injection  Commonly known as:  novoLOG  Inject 10-14 Units into the skin daily as needed (90-150= 10 units 151-200= 11 units 201-250= 12 units 250-300= 13 units 301-350= 14 units as directed per sliding scale instructions). Per sliding scale. Patient injects 10-40 units for levels above 150 to 300     insulin detemir 100 UNIT/ML injection  Commonly known as:  LEVEMIR  Inject 50 Units into the skin at bedtime.     meclizine 25 MG  tablet  Commonly known as:  ANTIVERT  Take 25 mg by mouth 3 (three) times daily as needed for dizziness or nausea.     pregabalin 100 MG capsule  Commonly known as:  LYRICA  Take 100 mg by mouth daily.     spironolactone 50 MG tablet  Commonly known as:  ALDACTONE  Take 1 tablet (50 mg total) by mouth daily.     triamcinolone cream 0.1 %  Commonly known as:  KENALOG  Apply 1 application topically daily as needed (for irritation).       Allergies  Allergen Reactions  . Morphine And Related Itching  . Prochlorperazine Edisylate Other (See Comments)    Causes anxiety/numbness  . Compazine [Prochlorperazine Edisylate] Anxiety    Causes anxiety & nervousness   Follow-up Information   Follow up with Manus Rudd, MD. (Dr. Roseanne Kaufman office will call you with the followup appointments.)    Specialty:  Gastroenterology   Contact information:   [address] [place] [address] [phone]       Follow up with Purvis Kilts, MD. Schedule an appointment as soon as possible for a visit in 2 weeks.   Specialty:  Family Medicine   Contact information:   [address] [place] [address] [phone]        The results of significant diagnostics from this hospitalization (including imaging, microbiology, ancillary and laboratory) are listed below for reference.    Significant Diagnostic Studies: Dg Chest Port 1 View  04/09/2014   CLINICAL DATA:  Chest pain  EXAM: PORTABLE CHEST - 1 VIEW  COMPARISON:  Portable exam 0108 hr compared to 08/30/2013.  FINDINGS: Enlargement of cardiac silhouette.  Slight pulmonary vascular congestion.  Mediastinal contours normal.  No acute failure or consolidation.  No pleural effusion or pneumothorax.  Bones demineralized.  IMPRESSION: Enlargement of cardiac silhouette with slight vascular congestion.  No acute abnormalities.   Electronically Signed   By: Lavonia Dana M.D.   On: 04/09/2014 02:01   US  Abdomen Limited Ruq  04/11/2014   CLINICAL DATA:  Pancreatitis  EXAM: US ABDOMEN LIMITED - RIGHT UPPER QUADRANT  COMPARISON:  None.  FINDINGS: Gallbladder:  There are cholelithiasis. No pericholecystic fluid or gallbladder wall thickening. Negative sonographic Murphy sign.  Common bile duct:  Diameter: Prominent caliber common bile duct measuring up to 7.6 mm which is likely within normal limits given the patient's age.  Liver:  No focal hepatic lesion. Increased hepatic echogenicity. There is a micronodular contour or to the liver surface as  can be seen with cirrhosis. No perihepatic ascites.  IMPRESSION: 1. Cholelithiasis. 2. Cirrhotic liver.   Electronically Signed   By: Kathreen Devoid   On: 04/11/2014 12:43    Microbiology: Recent Results (from the past 240 hour(s))  URINE CULTURE     Status: None   Collection Time    04/09/14  5:50 AM      Result Value Ref Range Status   Specimen Description URINE, CLEAN CATCH   Final   Special Requests NONE   Final   Culture  Setup Time     Final   Value: 04/10/2014 00:16     Performed at [facility]     Final   Value: >=100,000 COLONIES/ML     Performed at [facility]   Culture     Final   Value: Multiple bacterial morphotypes present, none predominant. Suggest appropriate recollection if clinically indicated.     Performed at [facility]   Report Status 04/11/2014 FINAL   Final     Labs: Basic Metabolic Panel:  Recent Labs Lab 04/09/14 0055 04/10/14 0442 04/11/14 0542 04/12/14 0612  NA 140 138 140 139  K 4.3 4.0 3.9 3.6*  CL 101 105 104 103  CO2 25 25 24 25   GLUCOSE 251* 205* 187* 194*  BUN 18 15 10 9   CREATININE 0.78 0.64 0.61 0.57  CALCIUM 9.4 7.7* 8.0* 8.0*   Liver Function Tests:  Recent Labs Lab 04/09/14 0055 04/10/14 0442 04/12/14 0612  AST 24 34 16  ALT 10 22 13   ALKPHOS 108 88 87  BILITOT 1.7* 1.5* 1.1  PROT 6.6 5.0* 5.5*  ALBUMIN 3.6 2.7* 3.0*    Recent Labs Lab  04/09/14 0055 04/10/14 0442 04/12/14 0612  LIPASE 814* 121* 24    Recent Labs Lab 04/09/14 0143  AMMONIA 51   CBC:  Recent Labs Lab 04/09/14 0055 04/10/14 0442  04/11/14 0542 04/11/14 1403 04/11/14 2248 04/12/14 0612 04/12/14 1144  WBC 7.6 3.0*  --  2.7*  --   --   --  2.5*  NEUTROABS 6.8  --   --   --   --   --   --   --   HGB 12.1 9.8*  < > 10.0* 10.6* 10.8* 9.9* 10.0*  HCT 37.0 30.1*  < > 30.3* 31.9* 31.6* 29.7* 29.8*  MCV 77.2* 78.6  --  78.5  --   --   --  78.2  PLT 119* 93*  --  87*  --   --   --  77*  < > = values in this interval not displayed. Cardiac Enzymes: No results found for this basename: CKTOTAL, CKMB, CKMBINDEX, TROPONINI,  in the last 168 hours BNP: BNP (last 3 results) No results found for this basename: PROBNP,  in the last 8760 hours CBG:  Recent Labs Lab 04/11/14 1125 04/11/14 1630 04/11/14 2051 04/12/14 0730 04/12/14 1136  GLUCAP 176* 187* 168* 184* 182*       Signed:  Rexene Alberts  [facility] 04/12/2014, 4:13 PM

## 2014-04-12 NOTE — Telephone Encounter (Signed)
Recently inpatient for recurrent pancreatitis, likely biliary.   Needs the following:   1. EUS with Dr. Ardis Hughs to further evaluate for any occult issues. 2. Outpatient referral to Dr. Arnoldo Morale for consideration of lap cholecystectomy 3. Follow-up with me in 3 months.

## 2014-04-12 NOTE — Consult Note (Signed)
Referring Provider: Dr. Roderic Palau Primary Care Physician:  Purvis Kilts, MD Primary Gastroenterologist:  Dr. Gala Romney   Date of Admission: 04/09/14 Date of Consultation: 04/12/14  Reason for Consultation:  Pancreatitis  HPI:  [name] [name] is a pleasant 73 year old female with a history of NASH cirrhosis, hepatic encephalopathy (unable to afford Xifaxan), admitted with pancreatitis. Admitting lipase 814. Improved with supportive measures. [place] of abdomen with cholelithiasis, cirrhotic liver, no HCC. She was actually hospitalized in June 2014 with pancreatitis. EUS was recommended at that time  assess for occult issues. However, she cancelled this.   Last EGD April 2015 by Dr. Gala Romney with portal gastropathy, no varices. Due for surveillance April 2017. States acute onset of epigastric pain Thursday evening, associated episode of N/V. Denies ETOH use. Known history of gallstones. Several bowel movements a day on lactulose. Denies confusion, mental status changes. Didn't notice significant change with Lasix 20 mg and Aldactone 50 mg since March 2015. Notes lower extremity edema intermittently. In process of Hep A vaccination.    Past Medical History  Diagnosis Date  . CAD in native artery   . Thrombocytopenia, unspecified   . Unspecified hereditary and idiopathic peripheral neuropathy   . Unspecified fall   . Other and unspecified hyperlipidemia   . Unspecified essential hypertension   . Obesity, unspecified   . Shortness of breath   . Personal history of neurosis   . Intrinsic asthma, unspecified   . Allergic rhinitis, cause unspecified   . Cirrhosis of liver     NASH, afp on 06/17/12 =3.7, per pt she had hep B vaccines in 1996, hep A in process.   . Colon adenomas 03/08/10    tcs by Dr. Gala Romney  . Hyperplastic polyps of stomach 03/08/10    tcs by Dr. Dudley Major  . Chronic gastritis 03/09/11    egd by Dr. Gala Romney  . History of hemorrhoids 03/08/10    tcs- internal and external  . Diverticula of  colon 03/08/10    L side  . Hiatal hernia 03/08/10  . Esophagitis, erosive 03/08/10  . GERD (gastroesophageal reflux disease)   . Wears glasses   . Type II or unspecified type diabetes mellitus without mention of complication, not stated as uncontrolled   . Vertigo   . Pancreatitis   . Bilateral leg weakness     Past Surgical History  Procedure Laterality Date  . Hysterectomy and btl      s/p  . Abdominal hysterectomy    . Tumor excision  2003    rt arm and left foot  . Colonoscopy  03/08/10    Dr. [name]-->ext/int hemorrhoids, anal paipilla, rectal polyps, desc polyps, cecal polyp, left-sided diverticula. suboptiomal prep. next TCS 02/2015. multiple adenomas  . Esophagogastroduodenoscopy  03/08/10    Dr. [name] erosive RE, small hh, antral erosions, small 1cm are of mucosal indentation along gastric body of doubtful significance, cystic nodularity of hypophyarynx, base of tongue, base of epiglottis. benign gastric biopsies  . Tubal ligation    . Esophagogastroduodenoscopy  10/08/2011    Dr. [name] esophageal varices, antral erosion. next egd 09/2013  . Foot surgery  2003    lt foot  . Eye surgery      cataracts bilateral  . Orif humerus fracture Right 02/17/2013    Procedure: OPEN REDUCTION INTERNAL FIXATION (ORIF) RIGHT PROXIMAL HUMERUS FRACTURE;  Surgeon: [name], MD;  Location: [place];  Service: Orthopedics;  Laterality: Right;  . Partial gastrectomy  family denies  . Breast surgery    . Cardiac catheterization  02/25/2007    Dr. [name]:  normal LV systolic function, mild irregularities of LAD  . Esophagogastroduodenoscopy N/A 02/17/2014    Dr. Gala Romney: portal gastropathy, no varices. Due for surveillance 2017    Prior to Admission medications   Medication Sig Start Date End Date Taking? Authorizing Provider  ALPRAZolam Duanne Moron) 0.5 MG tablet Take 0.5 mg by mouth 3 (three) times daily as needed for anxiety. 08/09/12  Yes Kathie Dike, MD  aspirin (BAYER LOW  STRENGTH) 81 MG EC tablet Take 81 mg by mouth daily.     Yes Historical Provider, MD  budesonide-formoterol (SYMBICORT) 160-4.5 MCG/ACT inhaler Inhale 2 puffs into the lungs 2 (two) times daily.     Yes Historical Provider, MD  enalapril (VASOTEC) 5 MG tablet Take 5 mg by mouth daily.     Yes Historical Provider, MD  esomeprazole (NEXIUM) 40 MG capsule Take 40 mg by mouth every morning.   Yes Historical Provider, MD  ESTRACE VAGINAL 0.1 MG/GM vaginal cream Place vaginally every other day. At  bedtime 08/11/13  Yes Historical Provider, MD  furosemide (LASIX) 20 MG tablet Take 1 tablet (20 mg total) by mouth daily. 01/28/14  Yes Orvil Feil, NP  GENERLAC 10 GM/15ML SOLN Take 10 g by mouth 3 (three) times daily.  09/06/13  Yes Historical Provider, MD  insulin aspart (NOVOLOG) 100 UNIT/ML injection Inject 10-14 Units into the skin daily as needed (90-150= 10 units 151-200= 11 units 201-250= 12 units 250-300= 13 units 301-350= 14 units as directed per sliding scale instructions). Per sliding scale. Patient injects 10-40 units for levels above 150 to 300   Yes Historical Provider, MD  insulin detemir (LEVEMIR) 100 UNIT/ML injection Inject 50 Units into the skin at bedtime.    Yes Historical Provider, MD  meclizine (ANTIVERT) 25 MG tablet Take 25 mg by mouth 3 (three) times daily as needed for dizziness or nausea.    Yes Historical Provider, MD  Menthol, Topical Analgesic, (BIOFREEZE) 4 % GEL Apply 1 application topically daily as needed (for arm pain).   Yes Historical Provider, MD  Multiple Vitamins-Minerals (CENTRUM SILVER PO) Take 1 tablet by mouth daily.    Yes Historical Provider, MD  pregabalin (LYRICA) 100 MG capsule Take 100 mg by mouth daily.    Yes Historical Provider, MD  spironolactone (ALDACTONE) 50 MG tablet Take 1 tablet (50 mg total) by mouth daily. 01/28/14  Yes Orvil Feil, NP  triamcinolone cream (KENALOG) 0.1 % Apply 1 application topically daily as needed (for irritation).  06/26/13  Yes  Historical Provider, MD    Current Facility-Administered Medications  Medication Dose Route Frequency Provider Last Rate Last Dose  . ALPRAZolam Duanne Moron) tablet 0.5 mg  0.5 mg Oral TID PRN Orvan Falconer, MD   0.5 mg at 04/10/14 2209  . cefTRIAXone (ROCEPHIN) 1 g in dextrose 5 % 50 mL IVPB  1 g Intravenous Q24H Adeline C Viyuoh, MD   1 g at 04/11/14 1007  . conjugated estrogens (PREMARIN) vaginal cream 1 Applicatorful  1 Applicatorful Vaginal X52W Sheila Oats, MD   1 Applicatorful at 41/32/44 2120  . enalapril (VASOTEC) tablet 5 mg  5 mg Oral Daily Orvan Falconer, MD   5 mg at 04/11/14 1006  . heparin injection 5,000 Units  5,000 Units Subcutaneous 3 times per day Orvan Falconer, MD   5,000 Units at 04/12/14 0551  . HYDROmorphone (DILAUDID) injection 1 mg  1 mg Intravenous Q3H PRN Orvan Falconer, MD   1 mg at 04/12/14 0121  . insulin aspart (novoLOG) injection 0-5 Units  0-5 Units Subcutaneous QHS Sheila Oats, MD   2 Units at 04/09/14 2200  . insulin aspart (novoLOG) injection 0-9 Units  0-9 Units Subcutaneous TID WC Sheila Oats, MD   2 Units at 04/12/14 0800  . insulin glargine (LANTUS) injection 20 Units  20 Units Subcutaneous QHS Orvan Falconer, MD   20 Units at 04/11/14 2121  . lactulose (CHRONULAC) 10 GM/15ML solution 10 g  10 g Oral TID Orvan Falconer, MD   10 g at 04/11/14 2120  . mometasone-formoterol (DULERA) 200-5 MCG/ACT inhaler 2 puff  2 puff Inhalation BID Orvan Falconer, MD   2 puff at 04/12/14 0745  . ondansetron (ZOFRAN) tablet 4 mg  4 mg Oral Q6H PRN Orvan Falconer, MD       Or  . ondansetron [facility]) injection 4 mg  4 mg Intravenous Q6H PRN Orvan Falconer, MD   4 mg at 04/09/14 1660  . [START ON 04/13/2014] pantoprazole (PROTONIX) injection 40 mg  40 mg Intravenous Q12H Orvan Falconer, MD      . pregabalin (LYRICA) capsule 100 mg  100 mg Oral Daily Orvan Falconer, MD   100 mg at 04/11/14 1007  . spironolactone (ALDACTONE) tablet 50 mg  50 mg Oral Daily Orvan Falconer, MD   50 mg at 04/11/14 1006    Allergies as of 04/08/2014 - Review  Complete 04/08/2014  Allergen Reaction Noted  . Morphine and related Itching 04/26/2013  . Prochlorperazine edisylate Other (See Comments)   . Compazine [prochlorperazine edisylate] Anxiety 06/04/2012    Family History  Problem Relation Age of Onset  . Coronary artery disease      FH  . Diabetes      FH  . Heart disease Mother   . Colon cancer Neg Hx     History   Social History  . Marital Status: Married    Spouse Name: N/A    Number of Children: 3  . Years of Education: N/A   Occupational History  . Not on file.   Social History Main Topics  . Smoking status: Never Smoker   . Smokeless tobacco: Not on file  . Alcohol Use: No  . Drug Use: No  . Sexual Activity: Not Currently   Other Topics Concern  . Not on file   Social History Narrative  . No narrative on file    Review of Systems: As mentioned in HPI.   Physical Exam: Vital signs in last 24 hours: Temp:  [98.4 F (36.9 C)-98.5 F (36.9 C)] 98.5 F (36.9 C) (06/01 0605) Pulse Rate:  [66-71] 66 (06/01 0605) Resp:  [18-20] 20 (06/01 0605) BP: (116-137)/(64-79) 135/79 mmHg (06/01 0605) SpO2:  [91 %-95 %] 94 % (06/01 0746) Last BM Date: 04/10/14 General:   Alert,  Well-developed, well-nourished, pleasant and cooperative in NAD Head:  Normocephalic and atraumatic. Eyes:  Sclera clear, no icterus.   Conjunctiva pink. Ears:  Normal auditory acuity. Nose:  No deformity, discharge,  or lesions. Mouth:  No deformity or lesions Lungs:  Clear throughout to auscultation.   No wheezes, crackles, or rhonchi. No acute distress. Heart:  S1 S2 present, no murmurs appreciated Abdomen:  Soft, nontender and nondistended. No masses, hepatosplenomegaly or hernias noted. Obese. Difficult to appreciate HSM.  Rectal:  Deferred  Msk:  Symmetrical without gross deformities. Normal posture. Extremities:  Without edema. Neurologic:  Alert and  oriented x4;  grossly normal neurologically. Skin:  Intact without significant  lesions or rashes. Psych:  Alert and cooperative. Normal mood and affect.  Intake/Output from previous day: 05/31 0701 - 06/01 0700 In: 150 [P.O.:150] Out: 1150 [Urine:1150] Intake/Output this shift:    Lab Results:  Recent Labs  04/10/14 0442  04/11/14 0542 04/11/14 1403 04/11/14 2248 04/12/14 0612  WBC 3.0*  --  2.7*  --   --   --   HGB 9.8*  < > 10.0* 10.6* 10.8* 9.9*  HCT 30.1*  < > 30.3* 31.9* 31.6* 29.7*  PLT 93*  --  87*  --   --   --   < > = values in this interval not displayed. BMET  Recent Labs  04/10/14 0442 04/11/14 0542 04/12/14 0612  NA 138 140 139  K 4.0 3.9 3.6*  CL 105 104 103  CO2 25 24 25   GLUCOSE 205* 187* 194*  BUN 15 10 9   CREATININE 0.64 0.61 0.57  CALCIUM 7.7* 8.0* 8.0*   LFT  Recent Labs  04/10/14 0442 04/12/14 0612  PROT 5.0* 5.5*  ALBUMIN 2.7* 3.0*  AST 34 16  ALT 22 13  ALKPHOS 88 87  BILITOT 1.5* 1.1   PT/INR  Recent Labs  04/10/14 0703  LABPROT 14.9  INR 1.20    Studies/Results: US Abdomen Limited Ruq  04/11/2014   CLINICAL DATA:  Pancreatitis  EXAM: US ABDOMEN LIMITED - RIGHT UPPER QUADRANT  COMPARISON:  None.  FINDINGS: Gallbladder:  There are cholelithiasis. No pericholecystic fluid or gallbladder wall thickening. Negative sonographic Murphy sign.  Common bile duct:  Diameter: Prominent caliber common bile duct measuring up to 7.6 mm which is likely within normal limits given the patient's age.  Liver:  No focal hepatic lesion. Increased hepatic echogenicity. There is a micronodular contour or to the liver surface as can be seen with cirrhosis. No perihepatic ascites.  IMPRESSION: 1. Cholelithiasis. 2. Cirrhotic liver.   Electronically Signed   By: Kathreen Devoid   On: 04/11/2014 12:43    Impression: 73 year old female admitted with NASH cirrhosis, admitted with likely biliary pancreatitis, second occurrence. Clinically improved since admission and now tolerating diet. As previously recommended, would offer EUS as  outpatient with Dr. Ardis Hughs to assess for any occult issues. Will also offer follow-up with Dr. Arnoldo Morale as an outpatient to discuss elective cholecystectomy. Stable and appropriate from a GI standpoint to return home. Our practice will arrange the outpatient visits with Dr. Ardis Hughs and Dr. Arnoldo Morale.   Plan: Appropriate for discharge home from a GI standpoint Outpatient EUS Outpatient follow-up with Dr. Arnoldo Morale Will see patient back in 4-6 weeks as outpatient. Our office to arrange.    Orvil Feil, ANP-BC [facility]        LOS: 3 days    04/12/2014, 8:04 AM

## 2014-04-13 ENCOUNTER — Telehealth: Payer: Self-pay

## 2014-04-13 ENCOUNTER — Other Ambulatory Visit: Payer: Self-pay | Admitting: Gastroenterology

## 2014-04-13 DIAGNOSIS — K810 Acute cholecystitis: Secondary | ICD-10-CM

## 2014-04-13 DIAGNOSIS — K859 Acute pancreatitis without necrosis or infection, unspecified: Secondary | ICD-10-CM

## 2014-04-13 NOTE — Telephone Encounter (Signed)
Referral has been made to Dr. Ardis Hughs and Dr. Arnoldo Morale

## 2014-04-13 NOTE — Telephone Encounter (Signed)
Message copied by Barron Alvine on Tue Apr 13, 2014  3:04 PM ------      Message from: Milus Banister      Created: Tue Apr 13, 2014  2:54 PM      Regarding: RE: EUS       Needs upper EUS, radial +/- linear for recurrent pancreatitis, ++MAC, next avail EUS Thursday.            Thanks            ----- Message -----         From: Barron Alvine, CMA         Sent: 04/13/2014   8:20 AM           To: Milus Banister, MD      Subject: Melton Alar: EUS                                                              ----- Message -----         From: Michelene Gardener Lovelace         Sent: 04/13/2014   8:00 AM           To: Barron Alvine, CMA      Subject: EUS                                                      EUS with Dr. Ardis Hughs to further evaluate for any occult issues       ------

## 2014-04-14 ENCOUNTER — Other Ambulatory Visit: Payer: Self-pay

## 2014-04-14 ENCOUNTER — Encounter (HOSPITAL_COMMUNITY): Payer: Self-pay | Admitting: Pharmacy Technician

## 2014-04-14 DIAGNOSIS — K859 Acute pancreatitis without necrosis or infection, unspecified: Secondary | ICD-10-CM

## 2014-04-14 NOTE — Telephone Encounter (Signed)
EUS scheduled, pt instructed and medications reviewed.  Patient instructions mailed to home.  Patient to call with any questions or concerns.  

## 2014-04-16 ENCOUNTER — Encounter (HOSPITAL_COMMUNITY): Payer: Self-pay | Admitting: *Deleted

## 2014-04-20 ENCOUNTER — Encounter: Payer: Self-pay | Admitting: Gastroenterology

## 2014-04-20 NOTE — Telephone Encounter (Signed)
Pt is aware of OV on 9/1 at 1030 with AS and appt card mailed

## 2014-04-20 NOTE — Telephone Encounter (Signed)
Please arrange 3 month f/u with me. Thanks!

## 2014-04-23 ENCOUNTER — Telehealth: Payer: Self-pay | Admitting: Gastroenterology

## 2014-04-23 ENCOUNTER — Encounter: Payer: Self-pay | Admitting: Internal Medicine

## 2014-04-23 NOTE — Telephone Encounter (Signed)
Ok, thanks.

## 2014-04-23 NOTE — Telephone Encounter (Signed)
The pt cancelled her procedure because her brother passed away and wants to call back when she is ready to reschedule.  I sent a note to the referring.

## 2014-04-23 NOTE — Telephone Encounter (Signed)
error 

## 2014-04-26 ENCOUNTER — Telehealth: Payer: Self-pay | Admitting: Gastroenterology

## 2014-04-26 NOTE — Telephone Encounter (Signed)
Message copied by Rodney Langton on Mon Apr 26, 2014  7:55 AM ------      Message from: Barron Alvine      Created: Fri Apr 23, 2014  3:04 PM       Just an [name] [name] the pt cancelled her EUS, she had a death in the family and wants to reschedule at a later time. ------

## 2014-04-29 ENCOUNTER — Ambulatory Visit (HOSPITAL_COMMUNITY): Admission: RE | Admit: 2014-04-29 | Payer: Medicare Other | Source: Ambulatory Visit | Admitting: Gastroenterology

## 2014-04-29 HISTORY — DX: Unspecified cirrhosis of liver: K74.60

## 2014-04-29 HISTORY — DX: Abnormal electrocardiogram (ECG) (EKG): R94.31

## 2014-04-29 SURGERY — UPPER ENDOSCOPIC ULTRASOUND (EUS) LINEAR
Anesthesia: Monitor Anesthesia Care

## 2014-05-05 NOTE — Telephone Encounter (Signed)
Claiborne Billings from [facility] called and stated patient denied appointment with Dr. Arnoldo Morale on 05/06/14

## 2014-05-19 ENCOUNTER — Encounter: Payer: Self-pay | Admitting: Internal Medicine

## 2014-06-26 ENCOUNTER — Encounter (HOSPITAL_COMMUNITY): Payer: Self-pay | Admitting: Emergency Medicine

## 2014-06-26 ENCOUNTER — Inpatient Hospital Stay (HOSPITAL_COMMUNITY)
Admission: EM | Admit: 2014-06-26 | Discharge: 2014-06-28 | DRG: 442 | Disposition: A | Discharge status: Home Health | Payer: Medicare Other | Attending: Internal Medicine | Admitting: Internal Medicine

## 2014-06-26 ENCOUNTER — Emergency Department (HOSPITAL_COMMUNITY): Payer: Medicare Other

## 2014-06-26 DIAGNOSIS — Z91199 Patient's noncompliance with other medical treatment and regimen due to unspecified reason: Secondary | ICD-10-CM | POA: Diagnosis not present

## 2014-06-26 DIAGNOSIS — R5383 Other fatigue: Secondary | ICD-10-CM | POA: Diagnosis present

## 2014-06-26 DIAGNOSIS — Z8249 Family history of ischemic heart disease and other diseases of the circulatory system: Secondary | ICD-10-CM

## 2014-06-26 DIAGNOSIS — IMO0002 Reserved for concepts with insufficient information to code with codable children: Secondary | ICD-10-CM | POA: Diagnosis present

## 2014-06-26 DIAGNOSIS — K7682 Hepatic encephalopathy: Secondary | ICD-10-CM | POA: Diagnosis not present

## 2014-06-26 DIAGNOSIS — Z5987 Material hardship due to limited financial resources, not elsewhere classified: Secondary | ICD-10-CM

## 2014-06-26 DIAGNOSIS — I2510 Atherosclerotic heart disease of native coronary artery without angina pectoris: Secondary | ICD-10-CM | POA: Diagnosis present

## 2014-06-26 DIAGNOSIS — W19XXXA Unspecified fall, initial encounter: Secondary | ICD-10-CM | POA: Diagnosis present

## 2014-06-26 DIAGNOSIS — Z833 Family history of diabetes mellitus: Secondary | ICD-10-CM

## 2014-06-26 DIAGNOSIS — Z598 Other problems related to housing and economic circumstances: Secondary | ICD-10-CM

## 2014-06-26 DIAGNOSIS — K7290 Hepatic failure, unspecified without coma: Secondary | ICD-10-CM | POA: Diagnosis not present

## 2014-06-26 DIAGNOSIS — E119 Type 2 diabetes mellitus without complications: Secondary | ICD-10-CM | POA: Diagnosis present

## 2014-06-26 DIAGNOSIS — E785 Hyperlipidemia, unspecified: Secondary | ICD-10-CM | POA: Diagnosis present

## 2014-06-26 DIAGNOSIS — K7689 Other specified diseases of liver: Secondary | ICD-10-CM | POA: Diagnosis present

## 2014-06-26 DIAGNOSIS — K7460 Unspecified cirrhosis of liver: Secondary | ICD-10-CM | POA: Diagnosis present

## 2014-06-26 DIAGNOSIS — K219 Gastro-esophageal reflux disease without esophagitis: Secondary | ICD-10-CM | POA: Diagnosis present

## 2014-06-26 DIAGNOSIS — E860 Dehydration: Secondary | ICD-10-CM | POA: Diagnosis present

## 2014-06-26 DIAGNOSIS — E118 Type 2 diabetes mellitus with unspecified complications: Secondary | ICD-10-CM

## 2014-06-26 DIAGNOSIS — Z9119 Patient's noncompliance with other medical treatment and regimen: Secondary | ICD-10-CM

## 2014-06-26 DIAGNOSIS — I10 Essential (primary) hypertension: Secondary | ICD-10-CM | POA: Diagnosis present

## 2014-06-26 DIAGNOSIS — Z9181 History of falling: Secondary | ICD-10-CM

## 2014-06-26 DIAGNOSIS — K7581 Nonalcoholic steatohepatitis (NASH): Secondary | ICD-10-CM

## 2014-06-26 DIAGNOSIS — E1165 Type 2 diabetes mellitus with hyperglycemia: Secondary | ICD-10-CM

## 2014-06-26 DIAGNOSIS — S42301K Unspecified fracture of shaft of humerus, right arm, subsequent encounter for fracture with nonunion: Secondary | ICD-10-CM

## 2014-06-26 DIAGNOSIS — R531 Weakness: Secondary | ICD-10-CM | POA: Diagnosis present

## 2014-06-26 DIAGNOSIS — R5381 Other malaise: Secondary | ICD-10-CM | POA: Diagnosis not present

## 2014-06-26 DIAGNOSIS — R296 Repeated falls: Secondary | ICD-10-CM | POA: Diagnosis present

## 2014-06-26 LAB — GLUCOSE, CAPILLARY
GLUCOSE-CAPILLARY: 192 mg/dL — AB (ref 70–99)
Glucose-Capillary: 270 mg/dL — ABNORMAL HIGH (ref 70–99)

## 2014-06-26 LAB — CBC WITH DIFFERENTIAL/PLATELET
BASOS ABS: 0.1 10*3/uL (ref 0.0–0.1)
BASOS PCT: 1 % (ref 0–1)
Eosinophils Absolute: 0.2 10*3/uL (ref 0.0–0.7)
Eosinophils Relative: 3 % (ref 0–5)
HCT: 37.3 % (ref 36.0–46.0)
Hemoglobin: 12.5 g/dL (ref 12.0–15.0)
Lymphocytes Relative: 26 % (ref 12–46)
Lymphs Abs: 1.3 10*3/uL (ref 0.7–4.0)
MCH: 25.7 pg — ABNORMAL LOW (ref 26.0–34.0)
MCHC: 33.5 g/dL (ref 30.0–36.0)
MCV: 76.6 fL — ABNORMAL LOW (ref 78.0–100.0)
MONOS PCT: 10 % (ref 3–12)
Monocytes Absolute: 0.5 10*3/uL (ref 0.1–1.0)
NEUTROS PCT: 60 % (ref 43–77)
Neutro Abs: 3.1 10*3/uL (ref 1.7–7.7)
PLATELETS: 158 10*3/uL (ref 150–400)
RBC: 4.87 MIL/uL (ref 3.87–5.11)
RDW: 14.9 % (ref 11.5–15.5)
WBC: 5.1 10*3/uL (ref 4.0–10.5)

## 2014-06-26 LAB — COMPREHENSIVE METABOLIC PANEL
ALT: 8 U/L (ref 0–35)
AST: 20 U/L (ref 0–37)
Albumin: 3.4 g/dL — ABNORMAL LOW (ref 3.5–5.2)
Alkaline Phosphatase: 91 U/L (ref 39–117)
Anion gap: 13 (ref 5–15)
BUN: 14 mg/dL (ref 6–23)
CO2: 21 meq/L (ref 19–32)
CREATININE: 1.03 mg/dL (ref 0.50–1.10)
Calcium: 8.7 mg/dL (ref 8.4–10.5)
Chloride: 99 mEq/L (ref 96–112)
GFR calc Af Amer: 62 mL/min — ABNORMAL LOW (ref >90)
GFR, EST NON AFRICAN AMERICAN: 53 mL/min — AB (ref >90)
Glucose, Bld: 215 mg/dL — ABNORMAL HIGH (ref 70–99)
Potassium: 3.9 mEq/L (ref 3.7–5.3)
Sodium: 133 mEq/L — ABNORMAL LOW (ref 137–147)
TOTAL PROTEIN: 6.3 g/dL (ref 6.0–8.3)
Total Bilirubin: 1.4 mg/dL — ABNORMAL HIGH (ref 0.3–1.2)

## 2014-06-26 LAB — URINALYSIS, ROUTINE W REFLEX MICROSCOPIC
BILIRUBIN URINE: NEGATIVE
BILIRUBIN URINE: NEGATIVE
GLUCOSE, UA: 250 mg/dL — AB
Glucose, UA: 100 mg/dL — AB
Hgb urine dipstick: NEGATIVE
KETONES UR: NEGATIVE mg/dL
KETONES UR: NEGATIVE mg/dL
Leukocytes, UA: NEGATIVE
NITRITE: NEGATIVE
Nitrite: NEGATIVE
PH: 6 (ref 5.0–8.0)
PROTEIN: NEGATIVE mg/dL
Protein, ur: NEGATIVE mg/dL
SPECIFIC GRAVITY, URINE: 1.025 (ref 1.005–1.030)
Specific Gravity, Urine: >1.03 — ABNORMAL HIGH (ref 1.005–1.030)
Urobilinogen, UA: 1 mg/dL (ref 0.0–1.0)
Urobilinogen, UA: 2 mg/dL — ABNORMAL HIGH (ref 0.0–1.0)
pH: 6 (ref 5.0–8.0)

## 2014-06-26 LAB — AMMONIA: AMMONIA: 96 umol/L — AB (ref 11–60)

## 2014-06-26 LAB — URINE MICROSCOPIC-ADD ON

## 2014-06-26 LAB — CBG MONITORING, ED: GLUCOSE-CAPILLARY: 242 mg/dL — AB (ref 70–99)

## 2014-06-26 LAB — HEMOGLOBIN A1C
Hgb A1c MFr Bld: 7.7 % — ABNORMAL HIGH (ref <5.7)
Mean Plasma Glucose: 174 mg/dL — ABNORMAL HIGH (ref <117)

## 2014-06-26 LAB — TROPONIN I

## 2014-06-26 MED ORDER — SODIUM CHLORIDE 0.9 % IJ SOLN
3.0000 mL | Freq: Two times a day (BID) | INTRAMUSCULAR | Status: DC
  Administered 2014-06-27 – 2014-06-28 (×2): 3 mL via INTRAVENOUS

## 2014-06-26 MED ORDER — ASPIRIN EC 81 MG PO TBEC
81.0000 mg | DELAYED_RELEASE_TABLET | Freq: Every morning | ORAL | Status: DC
  Administered 2014-06-27 – 2014-06-28 (×2): 81 mg via ORAL
  Filled 2014-06-26 (×2): qty 1

## 2014-06-26 MED ORDER — ACETAMINOPHEN 650 MG RE SUPP: 650.0000 mg | Freq: Four times a day (QID) | RECTAL | Status: DC | PRN

## 2014-06-26 MED ORDER — POTASSIUM CHLORIDE IN NACL 20-0.9 MEQ/L-% IV SOLN
INTRAVENOUS | Status: DC
  Administered 2014-06-26 – 2014-06-27 (×3): via INTRAVENOUS

## 2014-06-26 MED ORDER — MECLIZINE HCL 12.5 MG PO TABS: 25.0000 mg | ORAL_TABLET | Freq: Three times a day (TID) | ORAL | Status: DC | PRN

## 2014-06-26 MED ORDER — PREGABALIN 50 MG PO CAPS
100.0000 mg | ORAL_CAPSULE | Freq: Every day | ORAL | Status: DC
  Administered 2014-06-27 – 2014-06-28 (×2): 100 mg via ORAL
  Filled 2014-06-26 (×2): qty 2

## 2014-06-26 MED ORDER — ALPRAZOLAM 0.5 MG PO TABS: 0.5000 mg | ORAL_TABLET | Freq: Two times a day (BID) | ORAL | Status: DC | PRN

## 2014-06-26 MED ORDER — INSULIN DETEMIR 100 UNIT/ML ~~LOC~~ SOLN
60.0000 [IU] | Freq: Every day | SUBCUTANEOUS | Status: DC
  Administered 2014-06-26 – 2014-06-27 (×2): 60 [IU] via SUBCUTANEOUS
  Filled 2014-06-26 (×3): qty 0.6

## 2014-06-26 MED ORDER — BUDESONIDE-FORMOTEROL FUMARATE 160-4.5 MCG/ACT IN AERO
2.0000 | INHALATION_SPRAY | Freq: Every day | RESPIRATORY_TRACT | Status: DC
  Administered 2014-06-27: 2 via RESPIRATORY_TRACT
  Filled 2014-06-26: qty 6

## 2014-06-26 MED ORDER — INSULIN ASPART 100 UNIT/ML ~~LOC~~ SOLN
0.0000 [IU] | Freq: Three times a day (TID) | SUBCUTANEOUS | Status: DC
  Administered 2014-06-26: 3 [IU] via SUBCUTANEOUS
  Administered 2014-06-27: 2 [IU] via SUBCUTANEOUS
  Administered 2014-06-27: 5 [IU] via SUBCUTANEOUS
  Administered 2014-06-27: 3 [IU] via SUBCUTANEOUS
  Administered 2014-06-28: 2 [IU] via SUBCUTANEOUS
  Administered 2014-06-28: 3 [IU] via SUBCUTANEOUS

## 2014-06-26 MED ORDER — LISINOPRIL 5 MG PO TABS
5.0000 mg | ORAL_TABLET | Freq: Every day | ORAL | Status: DC
  Administered 2014-06-27 – 2014-06-28 (×2): 5 mg via ORAL
  Filled 2014-06-26 (×2): qty 1

## 2014-06-26 MED ORDER — ONDANSETRON HCL 4 MG/2ML IJ SOLN: 4.0000 mg | Freq: Four times a day (QID) | INTRAMUSCULAR | Status: DC | PRN

## 2014-06-26 MED ORDER — LACTULOSE 10 GM/15ML PO SOLN
30.0000 g | Freq: Three times a day (TID) | ORAL | Status: DC
  Administered 2014-06-26: 30 g via ORAL
  Filled 2014-06-26 (×5): qty 45

## 2014-06-26 MED ORDER — ONDANSETRON HCL 4 MG PO TABS: 4.0000 mg | ORAL_TABLET | Freq: Four times a day (QID) | ORAL | Status: DC | PRN

## 2014-06-26 MED ORDER — PANTOPRAZOLE SODIUM 40 MG PO TBEC
40.0000 mg | DELAYED_RELEASE_TABLET | Freq: Every day | ORAL | Status: DC
  Administered 2014-06-27 – 2014-06-28 (×2): 40 mg via ORAL
  Filled 2014-06-26 (×2): qty 1

## 2014-06-26 MED ORDER — INSULIN ASPART 100 UNIT/ML ~~LOC~~ SOLN
0.0000 [IU] | Freq: Every day | SUBCUTANEOUS | Status: DC
  Administered 2014-06-26: 3 [IU] via SUBCUTANEOUS

## 2014-06-26 MED ORDER — LACTULOSE 10 GM/15ML PO SOLN
20.0000 g | Freq: Once | ORAL | Status: AC
  Administered 2014-06-26: 20 g via ORAL
  Filled 2014-06-26: qty 30

## 2014-06-26 MED ORDER — ACETAMINOPHEN 325 MG PO TABS
650.0000 mg | ORAL_TABLET | Freq: Four times a day (QID) | ORAL | Status: DC | PRN
  Administered 2014-06-27 – 2014-06-28 (×2): 650 mg via ORAL
  Filled 2014-06-26 (×2): qty 2

## 2014-06-26 MED ORDER — OXYCODONE HCL 5 MG PO TABS
10.0000 mg | ORAL_TABLET | Freq: Two times a day (BID) | ORAL | Status: DC
  Administered 2014-06-26 – 2014-06-28 (×4): 10 mg via ORAL
  Filled 2014-06-26 (×4): qty 2

## 2014-06-26 NOTE — H&P (Signed)
[facility] History and Physical  [name] [name] QQV:[id number] DOB: [birth year] DOA: 06/26/2014  Referring physician: Dr. Roderic Palau PCP: Purvis Kilts, MD   Chief Complaint: frequent falls  HPI: [name] [name] is a 73 y.o. female with history of NASH cirrhosis, presents to the emergency room with recurrent loss. Patient reports that she has been unsteady on her feet and fallen approximately 3-4 times a week for the last several weeks. She fell this morning on her right side and was brought to the emergency room for evaluation. She reports that she has been compliant with her lactulose use, but her family feels that she has not been taking it regularly. She's not had any cough, shortness of breath, fever, dysuria, abdominal pain or any other complaints. In the emergency room she was evaluated with CT scan of the head and cervical spine were unremarkable. Chest x-ray did not show any acute findings. She has chronic nonunion of right humerus fracture. Ammonia levels was noted to be elevated. She's being admitted for further treatment of hepatic encephalopathy.   Review of Systems:  Pertinent positives as per HPI, otherwise negative   Past Medical History  Diagnosis Date  . CAD in native artery   . Thrombocytopenia, unspecified   . Unspecified hereditary and idiopathic peripheral neuropathy   . Unspecified fall   . Other and unspecified hyperlipidemia   . Unspecified essential hypertension   . Obesity, unspecified   . Personal history of neurosis   . Intrinsic asthma, unspecified   . Allergic rhinitis, cause unspecified   . Cirrhosis of liver     NASH, afp on 06/17/12 =3.7, per pt she had hep B vaccines in 1996, hep A in process.   . Colon adenomas 03/08/10    tcs by Dr. Gala Romney  . Hyperplastic polyps of stomach 03/08/10    tcs by Dr. Dudley Major  . Chronic gastritis 03/09/11    egd by Dr. Gala Romney  . History of hemorrhoids 03/08/10    tcs- internal and external  . Diverticula of colon  03/08/10    L side  . Esophagitis, erosive 03/08/10  . GERD (gastroesophageal reflux disease)   . Wears glasses   . Type II or unspecified type diabetes mellitus without mention of complication, not stated as uncontrolled   . Vertigo   . Pancreatitis   . Bilateral leg weakness   . Thrombocytopenia 04/12/2014  . Pancytopenia, acquired 04/12/2014  . Cirrhosis   . Abnormal EKG     hx of left anterior fasicular block on 04-09-13 ekg epic   Past Surgical History  Procedure Laterality Date  . Hysterectomy and btl      s/p  . Tumor excision  2003    rt arm and left foot  . Colonoscopy  03/08/10    Dr. [name]-->ext/int hemorrhoids, anal paipilla, rectal polyps, desc polyps, cecal polyp, left-sided diverticula. suboptiomal prep. next TCS 02/2015. multiple adenomas  . Esophagogastroduodenoscopy  03/08/10    Dr. [name] erosive RE, small hh, antral erosions, small 1cm are of mucosal indentation along gastric body of doubtful significance, cystic nodularity of hypophyarynx, base of tongue, base of epiglottis. benign gastric biopsies  . Tubal ligation    . Esophagogastroduodenoscopy  10/08/2011    Dr. [name] esophageal varices, antral erosion. next egd 09/2013  . Foot surgery  2003    lt foot  . Eye surgery      cataracts bilateral  . Orif humerus fracture Right 02/17/2013    Procedure: OPEN REDUCTION INTERNAL FIXATION (  ORIF) RIGHT PROXIMAL HUMERUS FRACTURE;  Surgeon: [name], MD;  Location: [facility];  Service: Orthopedics;  Laterality: Right;  . Partial gastrectomy      family denies  . Cardiac catheterization  02/25/2007    Dr. [name]:  normal LV systolic function, mild irregularities of LAD  . Esophagogastroduodenoscopy N/A 02/17/2014    Dr. Gala Romney: portal gastropathy, no varices. Due for surveillance 2017  . Breast surgery Right few yrs ago    benign area removed  . Abdominal hysterectomy     Social History:  reports that she has never smoked. She has never used smokeless tobacco. She  reports that she does not drink alcohol or use illicit drugs.  Allergies  Allergen Reactions  . Morphine And Related Itching  . Prochlorperazine Edisylate Other (See Comments)    Causes anxiety/numbness, pt not sure of this allergy  . Compazine [Prochlorperazine Edisylate] Anxiety    Causes anxiety & nervousness    Family History  Problem Relation Age of Onset  . Coronary artery disease      FH  . Diabetes      FH  . Heart disease Mother   . Colon cancer Neg Hx      Prior to Admission medications   Medication Sig Start Date End Date Taking? Authorizing Provider  ALPRAZolam Duanne Moron) 0.5 MG tablet Take 0.5 mg by mouth 3 (three) times daily as needed for anxiety. 08/09/12  Yes Kathie Dike, MD  aspirin (BAYER LOW STRENGTH) 81 MG EC tablet Take 81 mg by mouth every morning.    Yes Historical Provider, MD  budesonide-formoterol (SYMBICORT) 160-4.5 MCG/ACT inhaler Inhale 2 puffs into the lungs at bedtime.    Yes Historical Provider, MD  cephALEXin (KEFLEX) 250 MG capsule Take 250 mg by mouth at bedtime. For 30 days, filled 06/07/2014   Yes Historical Provider, MD  esomeprazole (NEXIUM) 40 MG capsule Take 40 mg by mouth every morning.   Yes Historical Provider, MD  ESTRACE VAGINAL 0.1 MG/GM vaginal cream Place 1 Applicatorful vaginally every other day. At  bedtime 08/11/13  Yes Historical Provider, MD  furosemide (LASIX) 20 MG tablet Take 20 mg by mouth every morning.   Yes Historical Provider, MD  GENERLAC 10 GM/15ML SOLN Take 30 g by mouth 3 (three) times daily.  09/06/13  Yes Historical Provider, MD  insulin aspart (NOVOLOG FLEXPEN) 100 UNIT/ML FlexPen Inject 10-19 Units into the skin 3 (three) times daily with meals. Sliding Scale   Yes Historical Provider, MD  insulin detemir (LEVEMIR) 100 UNIT/ML injection Inject 60 Units into the skin at bedtime.    Yes Historical Provider, MD  meclizine (ANTIVERT) 25 MG tablet Take 25 mg by mouth 3 (three) times daily as needed for dizziness or  nausea.    Yes Historical Provider, MD  Multiple Vitamins-Minerals (CENTRUM SILVER PO) Take 1 tablet by mouth daily.    Yes Historical Provider, MD  Oxycodone HCl 10 MG TABS Take 10 mg by mouth 2 (two) times daily.   Yes Historical Provider, MD  pregabalin (LYRICA) 100 MG capsule Take 100 mg by mouth daily.    Yes Historical Provider, MD  quinapril (ACCUPRIL) 5 MG tablet Take 5 mg by mouth at bedtime.   Yes Historical Provider, MD  spironolactone (ALDACTONE) 50 MG tablet Take 50 mg by mouth every morning.   Yes Historical Provider, MD  sulfamethoxazole-trimethoprim (BACTRIM DS) 800-160 MG per tablet Take 1 tablet by mouth 2 (two) times daily. For 10 days, filled on 06/14/2014  Yes Historical Provider, MD  triamcinolone cream (KENALOG) 0.1 % Apply 1 application topically daily as needed (for irritation).  06/26/13   Historical Provider, MD   Physical Exam: Filed Vitals:   06/26/14 1229 06/26/14 1429 06/26/14 1530  BP: 123/55 106/53 111/54  Pulse: 71 67   Temp: 97.9 F (36.6 C)    TempSrc: Oral    Resp: 20 16 10   SpO2: 98% 95%     Wt Readings from Last 3 Encounters:  04/08/14 106.142 kg (234 lb)  02/17/14 109.77 kg (242 lb)  02/17/14 109.77 kg (242 lb)    General:  Appears calm and comfortable Eyes: PERRL, bruising noted over left eye lid, irises & conjunctiva ENT: grossly normal hearing, lips & tongue Neck: no LAD, masses or thyromegaly Cardiovascular: RRR, no m/r/g. 1+ LE edema. Telemetry: SR, no arrhythmias  Respiratory: CTA bilaterally, no w/r/r. Normal respiratory effort. Abdomen: soft, nt, obese Skin: no rash or induration seen on limited exam Musculoskeletal: grossly normal tone BUE/BLE Psychiatric: grossly normal mood and affect, speech fluent and appropriate Neurologic: grossly non-focal. Positive asterixis          Labs on Admission:  Basic Metabolic Panel:  Recent Labs Lab 06/26/14 1245  NA 133*  K 3.9  CL 99  CO2 21  GLUCOSE 215*  BUN 14  CREATININE 1.03    CALCIUM 8.7   Liver Function Tests:  Recent Labs Lab 06/26/14 1245  AST 20  ALT 8  ALKPHOS 91  BILITOT 1.4*  PROT 6.3  ALBUMIN 3.4*   No results found for this basename: LIPASE, AMYLASE,  in the last 168 hours  Recent Labs Lab 06/26/14 1245  AMMONIA 96*   CBC:  Recent Labs Lab 06/26/14 1245  WBC 5.1  NEUTROABS 3.1  HGB 12.5  HCT 37.3  MCV 76.6*  PLT 158   Cardiac Enzymes:  Recent Labs Lab 06/26/14 1245  TROPONINI <0.30    BNP (last 3 results) No results found for this basename: PROBNP,  in the last 8760 hours CBG:  Recent Labs Lab 06/26/14 1222  GLUCAP 242*    Radiological Exams on Admission: Dg Chest 1 View  06/26/2014   CLINICAL DATA:  Fall, unable to control RIGHT arm, history coronary artery disease, unspecified essential hypertension, cirrhosis, type 2 diabetes  EXAM: CHEST - 1 VIEW  COMPARISON:  04/09/2014  FINDINGS: Enlargement of cardiac silhouette.  Mediastinal contours and pulmonary vascularity normal.  Lungs clear.  No pleural effusion or pneumothorax.  No acute osseous findings.  Orthopedic hardware proximal RIGHT humerus post ORIF.  IMPRESSION: Enlargement of cardiac silhouette.  No acute abnormalities.   Electronically Signed   By: Lavonia Dana M.D.   On: 06/26/2014 14:12   Ct Head Wo Contrast  06/26/2014   CLINICAL DATA:  Fall, altered level consciousness  EXAM: CT HEAD WITHOUT CONTRAST  CT CERVICAL SPINE WITHOUT CONTRAST  TECHNIQUE: Multidetector CT imaging of the head and cervical spine was performed following the standard protocol without intravenous contrast. Multiplanar CT image reconstructions of the cervical spine were also generated.  COMPARISON:  CT 09/22/2013  FINDINGS: CT HEAD FINDINGS  No intracranial hemorrhage. No parenchymal contusion. No midline shift or mass effect. Basilar cisterns are patent. No skull base fracture. No fluid in the paranasal sinuses or mastoid air cells. Orbits are normal.  CT CERVICAL SPINE FINDINGS  No  prevertebral soft tissue swelling. Normal alignment of cervical vertebral bodies. No loss of vertebral body height. Normal facet articulation. Normal craniocervical junction.  No  evidence epidural or paraspinal hematoma.  There is endplate spurring anterior posteriorly at C5-C6 and C7. Joint space narrowing at these levels.  IMPRESSION: 1. No intracranial trauma. 2. No cervical spine fracture. 3. 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 No change from prior.   Electronically Signed   By: Suzy Bouchard M.D.   On: 06/26/2014 14:04   Ct Cervical Spine Wo Contrast  06/26/2014   CLINICAL DATA:  Fall, altered level consciousness  EXAM: CT HEAD WITHOUT CONTRAST  CT CERVICAL SPINE WITHOUT CONTRAST  TECHNIQUE: Multidetector CT imaging of the head and cervical spine was performed following the standard protocol without intravenous contrast. Multiplanar CT image reconstructions of the cervical spine were also generated.  COMPARISON:  CT 09/22/2013  FINDINGS: CT HEAD FINDINGS  No intracranial hemorrhage. No parenchymal contusion. No midline shift or mass effect. Basilar cisterns are patent. No skull base fracture. No fluid in the paranasal sinuses or mastoid air cells. Orbits are normal.  CT CERVICAL SPINE FINDINGS  No prevertebral soft tissue swelling. Normal alignment of cervical vertebral bodies. No loss of vertebral body height. Normal facet articulation. Normal craniocervical junction.  No evidence epidural or paraspinal hematoma.  There is endplate spurring anterior posteriorly at C5-C6 and C7. Joint space narrowing at these levels.  IMPRESSION: 1. No intracranial trauma. 2. No cervical spine fracture. 3. 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 No change from prior.   Electronically Signed   By: Suzy Bouchard M.D.   On: 06/26/2014 14:04   Dg Humerus Right  06/26/2014   CLINICAL DATA:  Fall  EXAM: RIGHT HUMERUS - 2+ VIEW  COMPARISON:  CT RIGHT humerus 03/10/2014  FINDINGS: Again  identified nonunion of an old proximal RIGHT humeral diaphyseal fracture.  Increased angulation identified at fracture fragments versus previous exam with interval fracture of the malleable plate at the proximal to mid RIGHT humerus.  Shoulder and elbow joint alignments grossly normal.  Diffuse osseous demineralization.  Periprosthetic lucency is seen surrounding the most proximal screw of the distal plate fragment.  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   Electronically Signed   By: Lavonia Dana M.D.   On: 06/26/2014 14:14    EKG: Independently reviewed. Sinus rhythm without ST -T changes  Assessment/Plan Principal Problem:   Encephalopathy, hepatic Active Problems:   DIABETES, TYPE 2   GERD   Essential hypertension   Generalized weakness   Liver cirrhosis secondary to NASH   Repeated falls   Dehydration   Hepatic encephalopathy   1. Hepatic encephalopathy, possibly precipitated by dehydration as well as noncompliance with lactulose. We will start her on 3 times a day lactulose. Repeat ammonia level in the morning. Per review of records, she has been tried on Xifaxan in the past, but was unable to obtain this medication due to financial issues. 2. Dehydration. We'll provide IV fluids. Hold diuretics 3. History of pancytopenia related to cirrhosis. Currently her WBC count/hemoglobin/platelets are in normal range. I suspect there is some degree of hemoconcentration. We'll provide IV fluids and recheck in a.m. 4. 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 followup with GI as an outpatient. 5. History of cholelithiasis and biliary pancreatitis. Patient has failed to show up for outpatient evaluations for EUS andgeneral surgery. She appears to be asymptomatic at this time. This can likely be pursued in the outpatient setting.  6. Chronic nonunion of right humerus fracture.  Patient plans on following up with Dr. Marcelino Scot who had initially done surgery on her right humerus. 7. Generalized weakness and repeated falls. Likely related to dehydration and hepatic encephalopathy. We'll request physical therapy evaluation 8. Hypertension. Continue current treatment 9. Type 2 diabetes. Sliding scale insulin. Continue basal insulin  Code Status: full code DVT Prophylaxis: scd Family Communication: discussed with patient and son at the bedside Disposition Plan: pending physical therapy evaluation  Time spent: 75mins  MEMON,JEHANZEB [facility] Pager [phone]  **Disclaimer: This note may have been dictated with voice recognition software. Similar sounding words can inadvertently be transcribed and this note may contain transcription errors which may not have been corrected upon publication of note.**

## 2014-06-26 NOTE — Progress Notes (Signed)
Pt transferred to floor from ED. Pt is in no acute distress and VS are stable. Pt is drowsy, but responds to voice and is oriented x4. Will continue to monitor.

## 2014-06-26 NOTE — ED Provider Notes (Signed)
CSN: [id number]     Arrival date & time 06/26/14  1212 History  This chart was scribed for Maudry Diego, MD by Starleen Arms, ED Scribe. This patient was seen in room APA03/APA03 and the patient's care was started at 12:29 PM.    Chief Complaint  Patient presents with  . Weakness    Patient is a 73 y.o. female presenting with fall. The history is provided by the patient and the spouse. No language interpreter was used.  Fall This is a new problem. The current episode started 1 to 2 hours ago. The problem occurs constantly. The problem has not changed since onset.Nothing aggravates the symptoms. Nothing relieves the symptoms.    HPI Comments: [name] [name] is a 73 y.o. female who presents to the Emergency Department complaining of a fall that ocurred earlier today.  She states she was walking out her front door when her legs gave out and she fell and landed on her right arm.  She currently complains of associated pain in the right arm.  Patient states she has fallen approximately 3-4 times this week and has a history of intermittent falls. Patient was recently diagnosed with a UTI and prescribed antibiotics.  She reports no improvement and has the same symptomatic complaints of her UTI including dysuria and intermittent confusion.  The husband states that the periodic episodes of confusion are typical of one of her sever UTIs.  Patient typically uses a wheelchair.  Patient denies head trauma, LOC, weakness.   Past Medical History  Diagnosis Date  . CAD in native artery   . Thrombocytopenia, unspecified   . Unspecified hereditary and idiopathic peripheral neuropathy   . Unspecified fall   . Other and unspecified hyperlipidemia   . Unspecified essential hypertension   . Obesity, unspecified   . Personal history of neurosis   . Intrinsic asthma, unspecified   . Allergic rhinitis, cause unspecified   . Cirrhosis of liver     NASH, afp on 06/17/12 =3.7, per pt she had hep B vaccines in 1996,  hep A in process.   . Colon adenomas 03/08/10    tcs by Dr. Gala Romney  . Hyperplastic polyps of stomach 03/08/10    tcs by Dr. Dudley Major  . Chronic gastritis 03/09/11    egd by Dr. Gala Romney  . History of hemorrhoids 03/08/10    tcs- internal and external  . Diverticula of colon 03/08/10    L side  . Esophagitis, erosive 03/08/10  . GERD (gastroesophageal reflux disease)   . Wears glasses   . Type II or unspecified type diabetes mellitus without mention of complication, not stated as uncontrolled   . Vertigo   . Pancreatitis   . Bilateral leg weakness   . Thrombocytopenia 04/12/2014  . Pancytopenia, acquired 04/12/2014  . Cirrhosis   . Abnormal EKG     hx of left anterior fasicular block on 04-09-13 ekg epic   Past Surgical History  Procedure Laterality Date  . Hysterectomy and btl      s/p  . Tumor excision  2003    rt arm and left foot  . Colonoscopy  03/08/10    Dr. [name]-->ext/int hemorrhoids, anal paipilla, rectal polyps, desc polyps, cecal polyp, left-sided diverticula. suboptiomal prep. next TCS 02/2015. multiple adenomas  . Esophagogastroduodenoscopy  03/08/10    Dr. [name] erosive RE, small hh, antral erosions, small 1cm are of mucosal indentation along gastric body of doubtful significance, cystic nodularity of hypophyarynx, base of tongue, base of  epiglottis. benign gastric biopsies  . Tubal ligation    . Esophagogastroduodenoscopy  10/08/2011    Dr. [name] esophageal varices, antral erosion. next egd 09/2013  . Foot surgery  2003    lt foot  . Eye surgery      cataracts bilateral  . Orif humerus fracture Right 02/17/2013    Procedure: OPEN REDUCTION INTERNAL FIXATION (ORIF) RIGHT PROXIMAL HUMERUS FRACTURE;  Surgeon: [name], MD;  Location: [facility];  Service: Orthopedics;  Laterality: Right;  . Partial gastrectomy      family denies  . Cardiac catheterization  02/25/2007    Dr. [name]:  normal LV systolic function, mild irregularities of LAD  . Esophagogastroduodenoscopy N/A  02/17/2014    Dr. Gala Romney: portal gastropathy, no varices. Due for surveillance 2017  . Breast surgery Right few yrs ago    benign area removed  . Abdominal hysterectomy     Family History  Problem Relation Age of Onset  . Coronary artery disease      FH  . Diabetes      FH  . Heart disease Mother   . Colon cancer Neg Hx    History  Substance Use Topics  . Smoking status: Never Smoker   . Smokeless tobacco: Never Used  . Alcohol Use: No   OB History   Grav Para Term Preterm Abortions TAB SAB Ect Mult Living                 Review of Systems  Constitutional: Negative for appetite change and fatigue.  HENT: Negative for congestion, ear discharge and sinus pressure.   Eyes: Negative for discharge.  Respiratory: Negative for cough.   Gastrointestinal: Negative for diarrhea.  Genitourinary: Positive for dysuria. Negative for frequency and hematuria.  Musculoskeletal: Positive for arthralgias. Negative for back pain.  Skin: Negative for rash.  Neurological: Negative for seizures.  Psychiatric/Behavioral: Negative for hallucinations.      Allergies  Morphine and related; Prochlorperazine edisylate; and Compazine  Home Medications   Prior to Admission medications   Medication Sig Start Date End Date Taking? Authorizing Provider  ALPRAZolam Duanne Moron) 0.5 MG tablet Take 0.5 mg by mouth 3 (three) times daily as needed for anxiety. 08/09/12   Kathie Dike, MD  aspirin (BAYER LOW STRENGTH) 81 MG EC tablet Take 81 mg by mouth every morning.     Historical Provider, MD  budesonide-formoterol (SYMBICORT) 160-4.5 MCG/ACT inhaler Inhale 2 puffs into the lungs 2 (two) times daily.      Historical Provider, MD  cefUROXime (CEFTIN) 250 MG tablet Take 1 tablet (250 mg total) by mouth 2 (two) times daily with a meal. Antibiotic. 04/12/14   Rexene Alberts, MD  enalapril (VASOTEC) 5 MG tablet Take 5 mg by mouth every morning.     Historical Provider, MD  esomeprazole (NEXIUM) 40 MG capsule Take  40 mg by mouth every morning.    Historical Provider, MD  ESTRACE VAGINAL 0.1 MG/GM vaginal cream Place 1 Applicatorful vaginally every other day. At  bedtime 08/11/13   Historical Provider, MD  furosemide (LASIX) 20 MG tablet Take 20 mg by mouth every morning.    Historical Provider, MD  GENERLAC 10 GM/15ML SOLN Take 10 g by mouth 2 (two) times daily.  09/06/13   Historical Provider, MD  ibuprofen (ADVIL,MOTRIN) 200 MG tablet Take 600 mg by mouth every 6 (six) hours as needed for moderate pain.    Historical Provider, MD  insulin aspart (NOVOLOG) 100 UNIT/ML injection Inject 10-14 Units  into the skin daily as needed (90-150= 10 units 151-200= 11 units 201-250= 12 units 250-300= 13 units 301-350= 14 units as directed per sliding scale instructions). Per sliding scale. Patient injects 10-40 units for levels above 150 to 300    Historical Provider, MD  insulin detemir (LEVEMIR) 100 UNIT/ML injection Inject 50 Units into the skin at bedtime.     Historical Provider, MD  meclizine (ANTIVERT) 25 MG tablet Take 25 mg by mouth 3 (three) times daily as needed for dizziness or nausea.     Historical Provider, MD  Menthol, Topical Analgesic, (BIOFREEZE) 4 % GEL Apply 1 application topically 3 (three) times daily as needed (for arm pain).     Historical Provider, MD  Multiple Vitamins-Minerals (CENTRUM SILVER PO) Take 1 tablet by mouth daily.     Historical Provider, MD  pregabalin (LYRICA) 100 MG capsule Take 100 mg by mouth daily.     Historical Provider, MD  spironolactone (ALDACTONE) 50 MG tablet Take 50 mg by mouth every morning.    Historical Provider, MD  triamcinolone cream (KENALOG) 0.1 % Apply 1 application topically daily as needed (for irritation).  06/26/13   Historical Provider, MD   There were no vitals taken for this visit. Physical Exam  Nursing note and vitals reviewed. Constitutional: She is oriented to person, place, and time. She appears well-developed.  Mildly lethargic.    HENT:  Head:  Normocephalic.  Eyes: Conjunctivae and EOM are normal. No scleral icterus.  Neck: Neck supple. No thyromegaly present.  Cardiovascular: Normal rate and regular rhythm.  Exam reveals no gallop and no friction rub.   No murmur heard. Pulmonary/Chest: No stridor. She has no wheezes. She has no rales. She exhibits no tenderness.  Abdominal: She exhibits no distension. There is no tenderness. There is no rebound.  Musculoskeletal: Normal range of motion. She exhibits no edema.  Lymphadenopathy:    She has no cervical adenopathy.  Neurological: She is oriented to person, place, and time. She exhibits normal muscle tone. Coordination normal.  Skin: No rash noted. No erythema.  Small bruise to upper eyelid.    Psychiatric: She has a normal mood and affect. Her behavior is normal.    ED Course  Procedures (including critical care time)  DIAGNOSTIC STUDIES: Oxygen Saturation is 98% on RA, normal by my interpretation.    COORDINATION OF CARE:  12:34 PM Discussed treatment plan with patient at bedside.  Patient acknowledges and agrees with plan.    Labs Review Labs Reviewed  CBG MONITORING, ED    Imaging Review No results found.   EKG Interpretation None      MDM   Final diagnoses:  None   Admit,  Hepatic encephalopathy  The chart was scribed for me under my direct supervision.  I personally performed the history, physical, and medical decision making and all procedures in the evaluation of this patient.Maudry Diego, MD 06/26/14 [phone]

## 2014-06-26 NOTE — ED Notes (Signed)
Pt states she is being treated for uti x 10 days. Pt reports increasing weakness today and fell x 2 at home this am. Pt states she hit her head on closet but denies loc. Pt c/o right arm pain from fall.

## 2014-06-27 DIAGNOSIS — E119 Type 2 diabetes mellitus without complications: Secondary | ICD-10-CM

## 2014-06-27 DIAGNOSIS — Z9181 History of falling: Secondary | ICD-10-CM

## 2014-06-27 LAB — COMPREHENSIVE METABOLIC PANEL
ALK PHOS: 79 U/L (ref 39–117)
ALT: 7 U/L (ref 0–35)
AST: 15 U/L (ref 0–37)
Albumin: 3 g/dL — ABNORMAL LOW (ref 3.5–5.2)
Anion gap: 8 (ref 5–15)
BILIRUBIN TOTAL: 1.2 mg/dL (ref 0.3–1.2)
BUN: 15 mg/dL (ref 6–23)
CHLORIDE: 106 meq/L (ref 96–112)
CO2: 23 mEq/L (ref 19–32)
Calcium: 8.5 mg/dL (ref 8.4–10.5)
Creatinine, Ser: 0.81 mg/dL (ref 0.50–1.10)
GFR calc Af Amer: 83 mL/min — ABNORMAL LOW (ref >90)
GFR calc non Af Amer: 71 mL/min — ABNORMAL LOW (ref >90)
GLUCOSE: 146 mg/dL — AB (ref 70–99)
POTASSIUM: 4.1 meq/L (ref 3.7–5.3)
Sodium: 137 mEq/L (ref 137–147)
TOTAL PROTEIN: 5.4 g/dL — AB (ref 6.0–8.3)

## 2014-06-27 LAB — GLUCOSE, CAPILLARY
Glucose-Capillary: 129 mg/dL — ABNORMAL HIGH (ref 70–99)
Glucose-Capillary: 189 mg/dL — ABNORMAL HIGH (ref 70–99)
Glucose-Capillary: 223 mg/dL — ABNORMAL HIGH (ref 70–99)
Glucose-Capillary: 223 mg/dL — ABNORMAL HIGH (ref 70–99)

## 2014-06-27 LAB — CBC
HCT: 32.2 % — ABNORMAL LOW (ref 36.0–46.0)
HEMOGLOBIN: 11 g/dL — AB (ref 12.0–15.0)
MCH: 26.4 pg (ref 26.0–34.0)
MCHC: 34.2 g/dL (ref 30.0–36.0)
MCV: 77.2 fL — ABNORMAL LOW (ref 78.0–100.0)
Platelets: 115 10*3/uL — ABNORMAL LOW (ref 150–400)
RBC: 4.17 MIL/uL (ref 3.87–5.11)
RDW: 14.9 % (ref 11.5–15.5)
WBC: 3 10*3/uL — AB (ref 4.0–10.5)

## 2014-06-27 LAB — AMMONIA: Ammonia: 92 umol/L — ABNORMAL HIGH (ref 11–60)

## 2014-06-27 MED ORDER — LACTULOSE 10 GM/15ML PO SOLN
30.0000 g | Freq: Three times a day (TID) | ORAL | Status: DC
  Administered 2014-06-27 (×2): 30 g via ORAL
  Filled 2014-06-27 (×2): qty 60

## 2014-06-27 MED ORDER — LACTULOSE 10 GM/15ML PO SOLN
30.0000 g | Freq: Three times a day (TID) | ORAL | Status: DC
  Administered 2014-06-27 – 2014-06-28 (×3): 30 g via ORAL
  Filled 2014-06-27 (×4): qty 60

## 2014-06-27 NOTE — Plan of Care (Signed)
Problem: Phase I Progression Outcomes Goal: OOB as tolerated unless otherwise ordered Outcome: Completed/Met Date Met:  06/27/14 Pt one assist up to bed side commode and one assist to sit up in chair

## 2014-06-27 NOTE — Progress Notes (Signed)
[facility] PROGRESS NOTE  [name] [name] IRS:[id number] DOB: [birth year] DOA: 06/26/2014 PCP: Purvis Kilts, MD  Assessment/Plan: 1. Hepatic encephalopathy. Patient may have a history of noncompliance. Also likely is precipitated by dehydration. She has been continued on lactulose 3 times a day, but ammonia level is relatively unchanged this morning. Patient describes only one small bowel movement last night. We will increase her lactulose to 4 times a day. Recheck ammonia level in the morning. 2. Dehydration. Improved with IV fluids. Diuretics currently on hold. 3. 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 Followup with GI 4. Chronic nonunion of right humerus fracture. Patient plans to followup with Dr. Marcelino Scot, orthopedics 5. Generalized weakness and repeated falls. Possibly related to dehydration and hepatic encephalopathy. We'll request physical therapy evaluation 6. Hypertension. Continue current regimen 7. Type 2 diabetes. Continue current regimen. Blood sugars are stable  Code Status: Full code Family Communication: Discussed with patient and family Disposition Plan: Discharge home once improved   Consultants:    Procedures:    Antibiotics:    HPI/Subjective: Feels about the same as yesterday. Maybe a little less fatigued.  Objective: Filed Vitals:   06/27/14 1420  BP: 124/48  Pulse: 65  Temp: 98.1 F (36.7 C)  Resp: 18    Intake/Output Summary (Last 24 hours) at 06/27/14 1724 Last data filed at 06/27/14 1637  Gross per 24 hour  Intake 2901.67 ml  Output    950 ml  Net 1951.67 ml   Filed Weights   06/26/14 1558 06/27/14 0558  Weight: 104.509 kg (230 lb 6.4 oz) 104.51 kg (230 lb 6.4 oz)    Exam:   General:  NAD  Cardiovascular: s1, s2, rrr  Respiratory: cta b  Abdomen: soft, nt, nd, bs+  Musculoskeletal: trace edema in LE bilaterally   Data Reviewed: Basic Metabolic Panel:  Recent Labs Lab 06/26/14 1245 06/27/14 0603  NA 133* 137  K  3.9 4.1  CL 99 106  CO2 21 23  GLUCOSE 215* 146*  BUN 14 15  CREATININE 1.03 0.81  CALCIUM 8.7 8.5   Liver Function Tests:  Recent Labs Lab 06/26/14 1245 06/27/14 0603  AST 20 15  ALT 8 7  ALKPHOS 91 79  BILITOT 1.4* 1.2  PROT 6.3 5.4*  ALBUMIN 3.4* 3.0*   No results found for this basename: LIPASE, AMYLASE,  in the last 168 hours  Recent Labs Lab 06/26/14 1245 06/27/14 0603  AMMONIA 96* 92*   CBC:  Recent Labs Lab 06/26/14 1245 06/27/14 0603  WBC 5.1 3.0*  NEUTROABS 3.1  --   HGB 12.5 11.0*  HCT 37.3 32.2*  MCV 76.6* 77.2*  PLT 158 115*   Cardiac Enzymes:  Recent Labs Lab 06/26/14 1245  TROPONINI <0.30   BNP (last 3 results) No results found for this basename: PROBNP,  in the last 8760 hours CBG:  Recent Labs Lab 06/26/14 1656 06/26/14 2039 06/27/14 0732 06/27/14 1131 06/27/14 1604  GLUCAP 192* 270* 129* 189* 223*    No results found for this or any previous visit (from the past 240 hour(s)).   Studies: Dg Chest 1 View  06/26/2014   CLINICAL DATA:  Fall, unable to control RIGHT arm, history coronary artery disease, unspecified essential hypertension, cirrhosis, type 2 diabetes  EXAM: CHEST - 1 VIEW  COMPARISON:  04/09/2014  FINDINGS: Enlargement of cardiac silhouette.  Mediastinal contours and pulmonary vascularity normal.  Lungs clear.  No pleural effusion or pneumothorax.  No acute osseous findings.  Orthopedic hardware proximal RIGHT  humerus post ORIF.  IMPRESSION: Enlargement of cardiac silhouette.  No acute abnormalities.   Electronically Signed   By: Lavonia Dana M.D.   On: 06/26/2014 14:12   Ct Head Wo Contrast  06/26/2014   CLINICAL DATA:  Fall, altered level consciousness  EXAM: CT HEAD WITHOUT CONTRAST  CT CERVICAL SPINE WITHOUT CONTRAST  TECHNIQUE: Multidetector CT imaging of the head and cervical spine was performed following the standard protocol without intravenous contrast. Multiplanar CT image reconstructions of the cervical spine  were also generated.  COMPARISON:  CT 09/22/2013  FINDINGS: CT HEAD FINDINGS  No intracranial hemorrhage. No parenchymal contusion. No midline shift or mass effect. Basilar cisterns are patent. No skull base fracture. No fluid in the paranasal sinuses or mastoid air cells. Orbits are normal.  CT CERVICAL SPINE FINDINGS  No prevertebral soft tissue swelling. Normal alignment of cervical vertebral bodies. No loss of vertebral body height. Normal facet articulation. Normal craniocervical junction.  No evidence epidural or paraspinal hematoma.  There is endplate spurring anterior posteriorly at C5-C6 and C7. Joint space narrowing at these levels.  IMPRESSION: 1. No intracranial trauma. 2. No cervical spine fracture. 3. 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 No change from prior.   Electronically Signed   By: Suzy Bouchard M.D.   On: 06/26/2014 14:04   Ct Cervical Spine Wo Contrast  06/26/2014   CLINICAL DATA:  Fall, altered level consciousness  EXAM: CT HEAD WITHOUT CONTRAST  CT CERVICAL SPINE WITHOUT CONTRAST  TECHNIQUE: Multidetector CT imaging of the head and cervical spine was performed following the standard protocol without intravenous contrast. Multiplanar CT image reconstructions of the cervical spine were also generated.  COMPARISON:  CT 09/22/2013  FINDINGS: CT HEAD FINDINGS  No intracranial hemorrhage. No parenchymal contusion. No midline shift or mass effect. Basilar cisterns are patent. No skull base fracture. No fluid in the paranasal sinuses or mastoid air cells. Orbits are normal.  CT CERVICAL SPINE FINDINGS  No prevertebral soft tissue swelling. Normal alignment of cervical vertebral bodies. No loss of vertebral body height. Normal facet articulation. Normal craniocervical junction.  No evidence epidural or paraspinal hematoma.  There is endplate spurring anterior posteriorly at C5-C6 and C7. Joint space narrowing at these levels.  IMPRESSION: 1. No intracranial trauma. 2. No  cervical spine fracture. 3. 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 No change from prior.   Electronically Signed   By: Suzy Bouchard M.D.   On: 06/26/2014 14:04   Dg Humerus Right  06/26/2014   CLINICAL DATA:  Fall  EXAM: RIGHT HUMERUS - 2+ VIEW  COMPARISON:  CT RIGHT humerus 03/10/2014  FINDINGS: Again identified nonunion of an old proximal RIGHT humeral diaphyseal fracture.  Increased angulation identified at fracture fragments versus previous exam with interval fracture of the malleable plate at the proximal to mid RIGHT humerus.  Shoulder and elbow joint alignments grossly normal.  Diffuse osseous demineralization.  Periprosthetic lucency is seen surrounding the most proximal screw of the distal plate fragment.  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   Electronically Signed   By: Lavonia Dana M.D.   On: 06/26/2014 14:14    Scheduled Meds: . aspirin EC  81 mg Oral q morning - 10a  . budesonide-formoterol  2 puff Inhalation QHS  . insulin aspart  0-15 Units Subcutaneous TID WC  . insulin aspart  0-5 Units Subcutaneous QHS  .  insulin detemir  60 Units Subcutaneous QHS  . lactulose  30 g Oral TID PC & HS  . lisinopril  5 mg Oral Daily  . oxyCODONE  10 mg Oral BID  . pantoprazole  40 mg Oral Daily  . pregabalin  100 mg Oral Daily  . sodium chloride  3 mL Intravenous Q12H   Continuous Infusions: . 0.9 % NaCl with KCl 20 mEq / L 100 mL/hr at 06/27/14 0247    Principal Problem:   Encephalopathy, hepatic Active Problems:   DIABETES, TYPE 2   GERD   Essential hypertension   Generalized weakness   Liver cirrhosis secondary to NASH   Repeated falls   Dehydration   Hepatic encephalopathy    Time spent: 86mins    Makiah Foye  [facility] Pager [phone]. If 7PM-7AM, please contact night-coverage at [email or web address], password  [facility] 06/27/2014, 5:24 PM  LOS: 1 day

## 2014-06-27 NOTE — Progress Notes (Signed)
Utilization review Completed Shantanu Strauch RN BSN   

## 2014-06-28 DIAGNOSIS — IMO0002 Reserved for concepts with insufficient information to code with codable children: Secondary | ICD-10-CM

## 2014-06-28 LAB — BASIC METABOLIC PANEL
ANION GAP: 12 (ref 5–15)
BUN: 13 mg/dL (ref 6–23)
CHLORIDE: 106 meq/L (ref 96–112)
CO2: 20 mEq/L (ref 19–32)
Calcium: 8.3 mg/dL — ABNORMAL LOW (ref 8.4–10.5)
Creatinine, Ser: 0.67 mg/dL (ref 0.50–1.10)
GFR calc non Af Amer: 86 mL/min — ABNORMAL LOW (ref >90)
Glucose, Bld: 188 mg/dL — ABNORMAL HIGH (ref 70–99)
Potassium: 4.4 mEq/L (ref 3.7–5.3)
Sodium: 138 mEq/L (ref 137–147)

## 2014-06-28 LAB — AMMONIA: AMMONIA: 67 umol/L — AB (ref 11–60)

## 2014-06-28 LAB — CBC
HEMATOCRIT: 32.5 % — AB (ref 36.0–46.0)
Hemoglobin: 10.8 g/dL — ABNORMAL LOW (ref 12.0–15.0)
MCH: 25.5 pg — ABNORMAL LOW (ref 26.0–34.0)
MCHC: 33.2 g/dL (ref 30.0–36.0)
MCV: 76.7 fL — ABNORMAL LOW (ref 78.0–100.0)
PLATELETS: 105 10*3/uL — AB (ref 150–400)
RBC: 4.24 MIL/uL (ref 3.87–5.11)
RDW: 14.8 % (ref 11.5–15.5)
WBC: 2.8 10*3/uL — ABNORMAL LOW (ref 4.0–10.5)

## 2014-06-28 LAB — GLUCOSE, CAPILLARY
Glucose-Capillary: 150 mg/dL — ABNORMAL HIGH (ref 70–99)
Glucose-Capillary: 159 mg/dL — ABNORMAL HIGH (ref 70–99)

## 2014-06-28 MED ORDER — LACTULOSE ENCEPHALOPATHY 10 GM/15ML PO SOLN: 30.0000 g | Freq: Three times a day (TID) | ORAL | Status: DC

## 2014-06-28 MED ORDER — RIFAXIMIN 550 MG PO TABS
550.0000 mg | ORAL_TABLET | Freq: Two times a day (BID) | ORAL | Status: DC
  Administered 2014-06-28: 550 mg via ORAL
  Filled 2014-06-28 (×5): qty 1

## 2014-06-28 MED ORDER — RIFAXIMIN 550 MG PO TABS: 550.0000 mg | ORAL_TABLET | Freq: Two times a day (BID) | ORAL | Status: DC

## 2014-06-28 NOTE — Care Management Note (Signed)
    Page 1 of 1   06/28/2014     1:52:01 PM CARE MANAGEMENT NOTE 06/28/2014  Patient:  [name] [name], [name] [name]   Account Number:  [id number]  Date Initiated:  06/28/2014  Documentation initiated by:  Theophilus Kinds  Subjective/Objective Assessment:   Pt admitted from home with hepatic encephalopathy. Pt lives with her husband adn will return home at discharge. Pt has a walker, BSC, and w/c for home use. Pt stated that she requires assistance with ADL's.     Action/Plan:   HH RN and PT ordered and pt chooses [facility] (per pt choice). Romualdo Bolk of [facility] is aware and will collect the pts information in the chart. [facility] services to start within 48 hours of discharge. No DME needs noted. Pt and pts nurse aware of d/c.   Anticipated DC Date:  06/28/2014   Anticipated DC Plan:  [place]  CM consult      [facility] Choice  HOME HEALTH   Choice offered to / List presented to:  C-1 Patient        [place] arranged  HH-1 RN  [place].   Status of service:  Completed, signed off Medicare Important Message given?  NA - LOS <3 / Initial given by admissions (If response is "NO", the following Medicare IM given date fields will be blank) Date Medicare IM given:   Medicare IM given by:   Date Additional Medicare IM given:   Additional Medicare IM given by:    Discharge Disposition:  [place]  Per UR Regulation:    If discussed at Long Length of Stay Meetings, dates discussed:    Comments:  06/28/14 [place], RN BSN CM

## 2014-06-28 NOTE — Discharge Summary (Signed)
Physician Discharge Summary  [name] [name] IOX:[id number] DOB: 22-Jun-1941 DOA: 06/26/2014  PCP: Purvis Kilts, MD  Admit date: 06/26/2014 Discharge date: 06/28/2014  Time spent: 40 minutes  Recommendations for Outpatient Follow-up:  1. Followup with primary care physician in one to 2 weeks. 2. Follow up with GI as scheduled  Discharge Diagnoses:  Principal Problem:   Encephalopathy, hepatic Active Problems:   DIABETES, TYPE 2   GERD   Essential hypertension   Generalized weakness   Liver cirrhosis secondary to NASH   Repeated falls   Dehydration   Hepatic encephalopathy   Discharge Condition: Improved  Diet recommendation: Low-salt  Filed Weights   06/26/14 1558 06/27/14 0558  Weight: 104.509 kg (230 lb 6.4 oz) 104.51 kg (230 lb 6.4 oz)    History of present illness and hospital course:  This patient was admitted to the hospital with progressive weakness, falls. She was noted to be dehydrated on admission as well as having an elevated ammonia level. She did have mild asterixis consistent with a mild hepatic encephalopathy. Her family reported that she was not taking her lactulose regular basis. Patient was continued on lactulose in the hospital hydrated with IV fluids. She appears to have improved. Her asterixis have resolved and she appears to be mentally clear. She was adequately hydrated with IV fluids. She was also started on Xifaxan for hepatic encephalopathy. I have discussed her case with the gastroenterology service and they will ensure that the patient is able to receive this as an outpatient. Patient was seen by physical therapy and recommended home health physical therapy. She is otherwise stable for discharge  Procedures:    Consultations:    Discharge Exam: Filed Vitals:   06/28/14 1357  BP: 108/43  Pulse: 72  Temp: 98.1 F (36.7 C)  Resp: 18    General: NAD Cardiovascular: S1, S2 RRR Respiratory: CTA B  Discharge Instructions You were  cared for by a hospitalist during your hospital stay. If you have any questions about your discharge medications or the care you received while you were in the hospital after you are discharged, you can call the unit and asked to speak with the hospitalist on call if the hospitalist that took care of you is not available. Once you are discharged, your primary care physician will handle any further medical issues. Please note that NO REFILLS for any discharge medications will be authorized once you are discharged, as it is imperative that you return to your primary care physician (or establish a relationship with a primary care physician if you do not have one) for your aftercare needs so that they can reassess your need for medications and monitor your lab values.  Discharge Instructions   Call MD for:  persistant dizziness or light-headedness    Complete by:  As directed      Call MD for:  severe uncontrolled pain    Complete by:  As directed      Call MD for:  temperature >100.4    Complete by:  As directed      Diet - low sodium heart healthy    Complete by:  As directed      Increase activity slowly    Complete by:  As directed             Medication List    STOP taking these medications       sulfamethoxazole-trimethoprim 800-160 MG per tablet  Commonly known as:  BACTRIM DS  TAKE these medications       ALPRAZolam 0.5 MG tablet  Commonly known as:  XANAX  Take 0.5 mg by mouth 3 (three) times daily as needed for anxiety.     BAYER LOW STRENGTH 81 MG EC tablet  Generic drug:  aspirin  Take 81 mg by mouth every morning.     budesonide-formoterol 160-4.5 MCG/ACT inhaler  Commonly known as:  SYMBICORT  Inhale 2 puffs into the lungs at bedtime.     CENTRUM SILVER PO  Take 1 tablet by mouth daily.     cephALEXin 250 MG capsule  Commonly known as:  KEFLEX  Take 250 mg by mouth at bedtime. For 30 days, filled 06/07/2014     esomeprazole 40 MG capsule  Commonly known as:   NEXIUM  Take 40 mg by mouth every morning.     ESTRACE VAGINAL 0.1 MG/GM vaginal cream  Generic drug:  estradiol  Place 1 Applicatorful vaginally every other day. At  bedtime     furosemide 20 MG tablet  Commonly known as:  LASIX  Take 20 mg by mouth every morning.     insulin detemir 100 UNIT/ML injection  Commonly known as:  LEVEMIR  Inject 60 Units into the skin at bedtime.     lactulose (encephalopathy) 10 GM/15ML Soln  Commonly known as:  GENERLAC  Take 45 mLs (30 g total) by mouth 3 (three) times daily.     meclizine 25 MG tablet  Commonly known as:  ANTIVERT  Take 25 mg by mouth 3 (three) times daily as needed for dizziness or nausea.     NOVOLOG FLEXPEN 100 UNIT/ML FlexPen  Generic drug:  insulin aspart  Inject 10-19 Units into the skin 3 (three) times daily with meals. Sliding Scale     Oxycodone HCl 10 MG Tabs  Take 10 mg by mouth 2 (two) times daily.     pregabalin 100 MG capsule  Commonly known as:  LYRICA  Take 100 mg by mouth daily.     quinapril 5 MG tablet  Commonly known as:  ACCUPRIL  Take 5 mg by mouth at bedtime.     rifaximin 550 MG Tabs tablet  Commonly known as:  XIFAXAN  Take 1 tablet (550 mg total) by mouth 2 (two) times daily.     spironolactone 50 MG tablet  Commonly known as:  ALDACTONE  Take 50 mg by mouth every morning.     triamcinolone cream 0.1 %  Commonly known as:  KENALOG  Apply 1 application topically daily as needed (for irritation).       Allergies  Allergen Reactions  . Morphine And Related Itching  . Prochlorperazine Edisylate Other (See Comments)    Causes anxiety/numbness, pt not sure of this allergy  . Compazine [Prochlorperazine Edisylate] Anxiety    Causes anxiety & nervousness       Follow-up Information   Follow up with Purvis Kilts, MD. Schedule an appointment as soon as possible for a visit in 2 weeks.   Specialty:  Family Medicine   Contact information:   [address] [place]  [address] [phone]       Follow up with [facility].   Contact information:   [address] [place] [address] [phone]        The results of significant diagnostics from this hospitalization (including imaging, microbiology, ancillary and laboratory) are listed below for reference.    Significant Diagnostic Studies: Dg Chest 1 View  06/26/2014   CLINICAL DATA:  Fall, unable to control RIGHT arm, history coronary artery disease, unspecified essential hypertension, cirrhosis, type 2 diabetes  EXAM: CHEST - 1 VIEW  COMPARISON:  04/09/2014  FINDINGS: Enlargement of cardiac silhouette.  Mediastinal contours and pulmonary vascularity normal.  Lungs clear.  No pleural effusion or pneumothorax.  No acute osseous findings.  Orthopedic hardware proximal RIGHT humerus post ORIF.  IMPRESSION: Enlargement of cardiac silhouette.  No acute abnormalities.   Electronically Signed   By: Lavonia Dana M.D.   On: 06/26/2014 14:12   Ct Head Wo Contrast  06/26/2014   CLINICAL DATA:  Fall, altered level consciousness  EXAM: CT HEAD WITHOUT CONTRAST  CT CERVICAL SPINE WITHOUT CONTRAST  TECHNIQUE: Multidetector CT imaging of the head and cervical spine was performed following the standard protocol without intravenous contrast. Multiplanar CT image reconstructions of the cervical spine were also generated.  COMPARISON:  CT 09/22/2013  FINDINGS: CT HEAD FINDINGS  No intracranial hemorrhage. No parenchymal contusion. No midline shift or mass effect. Basilar cisterns are patent. No skull base fracture. No fluid in the paranasal sinuses or mastoid air cells. Orbits are normal.  CT CERVICAL SPINE FINDINGS  No prevertebral soft tissue swelling. Normal alignment of cervical vertebral bodies. No loss of vertebral body height. Normal facet articulation. Normal craniocervical junction.  No evidence epidural or paraspinal hematoma.  There is endplate spurring anterior posteriorly at C5-C6 and C7.  Joint space narrowing at these levels.  IMPRESSION: 1. No intracranial trauma. 2. No cervical spine fracture. 3. 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 No change from prior.   Electronically Signed   By: Suzy Bouchard M.D.   On: 06/26/2014 14:04   Ct Cervical Spine Wo Contrast  06/26/2014   CLINICAL DATA:  Fall, altered level consciousness  EXAM: CT HEAD WITHOUT CONTRAST  CT CERVICAL SPINE WITHOUT CONTRAST  TECHNIQUE: Multidetector CT imaging of the head and cervical spine was performed following the standard protocol without intravenous contrast. Multiplanar CT image reconstructions of the cervical spine were also generated.  COMPARISON:  CT 09/22/2013  FINDINGS: CT HEAD FINDINGS  No intracranial hemorrhage. No parenchymal contusion. No midline shift or mass effect. Basilar cisterns are patent. No skull base fracture. No fluid in the paranasal sinuses or mastoid air cells. Orbits are normal.  CT CERVICAL SPINE FINDINGS  No prevertebral soft tissue swelling. Normal alignment of cervical vertebral bodies. No loss of vertebral body height. Normal facet articulation. Normal craniocervical junction.  No evidence epidural or paraspinal hematoma.  There is endplate spurring anterior posteriorly at C5-C6 and C7. Joint space narrowing at these levels.  IMPRESSION: 1. No intracranial trauma. 2. No cervical spine fracture. 3. 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 No change from prior.   Electronically Signed   By: Suzy Bouchard M.D.   On: 06/26/2014 14:04   Dg Humerus Right  06/26/2014   CLINICAL DATA:  Fall  EXAM: RIGHT HUMERUS - 2+ VIEW  COMPARISON:  CT RIGHT humerus 03/10/2014  FINDINGS: Again identified nonunion of an old proximal RIGHT humeral diaphyseal fracture.  Increased angulation identified at fracture fragments versus previous exam with interval fracture of the malleable plate at the proximal to mid RIGHT humerus.  Shoulder and elbow joint alignments grossly normal.   Diffuse osseous demineralization.  Periprosthetic lucency is seen surrounding the most proximal screw of the distal plate fragment.  IMPRESSION: Persistent nonunion of a proximal RIGHT humeral fracture with interval fracture of the orthopedic plate and  increased angulation of the fracture fragments.   Electronically Signed   By: Lavonia Dana M.D.   On: 06/26/2014 14:14    Microbiology: No results found for this or any previous visit (from the past 240 hour(s)).   Labs: Basic Metabolic Panel:  Recent Labs Lab 06/26/14 1245 06/27/14 0603 06/28/14 0522  NA 133* 137 138  K 3.9 4.1 4.4  CL 99 106 106  CO2 21 23 20   GLUCOSE 215* 146* 188*  BUN 14 15 13   CREATININE 1.03 0.81 0.67  CALCIUM 8.7 8.5 8.3*   Liver Function Tests:  Recent Labs Lab 06/26/14 1245 06/27/14 0603  AST 20 15  ALT 8 7  ALKPHOS 91 79  BILITOT 1.4* 1.2  PROT 6.3 5.4*  ALBUMIN 3.4* 3.0*   No results found for this basename: LIPASE, AMYLASE,  in the last 168 hours  Recent Labs Lab 06/26/14 1245 06/27/14 0603 06/28/14 0522  AMMONIA 96* 92* 67*   CBC:  Recent Labs Lab 06/26/14 1245 06/27/14 0603 06/28/14 0522  WBC 5.1 3.0* 2.8*  NEUTROABS 3.1  --   --   HGB 12.5 11.0* 10.8*  HCT 37.3 32.2* 32.5*  MCV 76.6* 77.2* 76.7*  PLT 158 115* 105*   Cardiac Enzymes:  Recent Labs Lab 06/26/14 1245  TROPONINI <0.30   BNP: BNP (last 3 results) No results found for this basename: PROBNP,  in the last 8760 hours CBG:  Recent Labs Lab 06/27/14 1131 06/27/14 1604 06/27/14 2051 06/28/14 0741 06/28/14 1143  GLUCAP 189* 223* 223* 150* 159*       Signed:  Hazem Kenner  [facility] 06/28/2014, 6:56 PM

## 2014-06-28 NOTE — Progress Notes (Signed)
Inpatient Diabetes Program Recommendations  AACE/ADA: New Consensus Statement on Inpatient Glycemic Control (2013)  Target Ranges:  Prepandial:   less than 140 mg/dL      Peak postprandial:   less than 180 mg/dL (1-2 hours)      Critically ill patients:  140 - 180 mg/dL   Results for RAWAN, RIENDEAU (MRN [id number]) as of 06/28/2014 08:41  Ref. Range 06/27/2014 07:32 06/27/2014 11:31 06/27/2014 16:04 06/27/2014 20:51 06/28/2014 07:41  Glucose-Capillary Latest Range: 70-99 mg/dL 129 (H) 189 (H) 223 (H) 223 (H) 150 (H)   Diabetes history: DM2 Outpatient Diabetes medications: Novolog 10-19 units TID, Levemir 60 units QHS Current orders for Inpatient glycemic control: Levemir 60 units QHS, Novolog 0-15 units AC, Novolog 0-5 units HS  Inpatient Diabetes Program Recommendations Insulin - Meal Coverage: Please consider ordering meal coverage; recommend starting with Novolog 4 units TID with meals.  Thanks, Barnie Alderman, RN, MSN, CCRN Diabetes Coordinator Inpatient Diabetes Program [phone] (Team Pager) [phone] ([facility] office) [phone] [facility] office)

## 2014-06-28 NOTE — Progress Notes (Signed)
Patient's IV was removed and clean, dry, and intact at removal.  Patient and family member received discharge instructions and scripts.  Patient had not further questions/concerns and was able to teach back about medications, follow-up appointments, and diet.  Patient was escorted out to vehicle via wheelchair by nurse tech.

## 2014-06-28 NOTE — Discharge Instructions (Signed)
Hepatic Encephalopathy °Hepatic encephalopathy is a syndrome. This is a set of symptoms that occur together. It is seen mostly in patients with damage to the liver known as cirrhosis. This is where normal liver tissue has been replaced by scar tissue.  °Symptoms of the syndrome include: °· Changes in personality. °· Mental impairment. °· A depressed level of consciousness. °These changes occur because toxins build up in the bloodstream. The build up occurs because the scarred liver cannot rid toxins from the body. The most important of these toxins is ammonia. Toxins can cause abnormal behavior and confusion. Toxins in the blood stream can impair your ability to take care of yourself or others. Some people become very sleepy and cannot be woken easily. In severe cases, the patient lapses into a coma.  °CAUSES  °There are many things that can cause liver damage that can lead to buildup of toxins. These include: °· Diseases that cause cirrhosis of the liver. °· Long-term alcohol use with progressive liver damage. °· Hepatitis B or C with ongoing infection and liver damage. °· Patients without cirrhosis who have undergone shunt surgery. °· Kidney failure. °· Bleeding in the stomach or intestines. °· Infection. °· Constipation. °· Medications that act upon the central nervous system. °· Diuretic therapy. °· Excessive dietary protein. °SYMPTOMS  °Symptoms of this syndrome are categorized or "staged" based on severity.  °· Stage 0. Minimal hepatic encephalopathy. No detectable changes in personality or behavior. Minimal changes in memory, concentration, mental function, and physical ability. °· Stage 1. Some lack of awareness. Shortened attention span. Problems with addition or subtraction. Possible problems with sleeping or a reversal of the normal sleep pattern. Euphoria, depression, or irritability may be present. Mild confusion. Slowing of mental ability. Tremors may be detected. °· Stage 2. Lethargy or apathy.  Disoriented. Strange behavior. Slurred speech. Obvious tremors. Drowsiness, unable to perform mental tasks. Personality changes, and confusion about time. °· Stage 3. Very sleepy but can be aroused. Unable to perform mental tasks, cannot keep track of time and place, marked confusion, amnesia, occasional fits of rage, speech cannot be understood. °· Stage 4. Coma with or without response to painful stimuli. °DIAGNOSIS  °In mild cases, a careful history and physical exam may lead your caregiver to consider possible mild hepatic encephalopathy as the cause of symptoms. The diagnosis is clearer in more severe cases. An elevated blood ammonia level is the classic blood test abnormality in patients with this syndrome. Other tests can be helpful to rule out other diseases.  °TREATMENT  °· Medications are often used to lower the ammonia level in the blood. This usually leads to improvement. °· Diets containing vegetable proteins are better than diets rich in animal protein, especially proteins derived from red meats. Eating well-cooked chicken and fish in addition to vegetable protein should be discussed with your caregiver. Malnourished patients are encouraged to add liquid nutritional supplements to their diet. °· Antibiotics are sometimes used to try to lessen the volume of bacteria in the intestines that produce ammonia. °· Moderate to severe cases of this syndrome usually require a hospital stay and medicine that is given directly into a vein (intravenously). °HOME CARE INSTRUCTIONS  °The goal at home is to avoid things that can make the condition worse and lead to a buildup of ammonia in the blood. °· Eat a well balanced diet. Your caregiver can help you with suggestions on this. °· Talk to your caregiver before taking vitamin supplements. Large doses of vitamins and minerals,   especially vitamin A, iron, or copper, can worsen liver damage. °· A low salt diet, water restriction, or diuretic medicine may be needed to  reduce fluid retention. °· Avoid alcohol and acetaminophen as well as any over-the-counter medications that contain acetaminophen (check labels). Only take over-the-counter or prescription medicines for pain, discomfort, or fever as directed by your caregiver. °· Avoid drugs that are toxic to the liver. Review your medications (both prescription and non-prescription) with your caregiver to make sure those you are taking will not be harmful. °· Blood tests may be needed. Follow your caregiver's advice regarding the timing of these. °· With this condition you play a critical role in maintaining your own good health. The failure to follow your caregiver's advice and these instructions may result in permanent disability or death. °SEEK MEDICAL CARE IF:  °· You have increasing fatigue or weakness. °· You develop increasing swelling of the abdomen, hands, feet, legs or face. °· You develop loss of appetite. °· You are feeling sick to your stomach (nausea) and vomiting. °· You develop jaundice. This is a yellow discoloration of the skin. °· You develop worsening problems with concentration, confusion, and/or problems with sleep. °SEEK IMMEDIATE MEDICAL CARE IF:  °· You vomit bright red blood or a coffee ground-looking material. °· You have blood in your stools. Or the stools turn black and tarry. °· You have a fever. °· You develop easy bruising or bleeding. °· You have a return of slurred speech, change in behavior, or confusion. °MAKE SURE YOU:  °· Understand these instructions. °· Will watch your condition. °· Will get help right away if you are not doing well or get worse. °Document Released: 01/08/2007 Document Revised: 01/21/2012 Document Reviewed: 10/15/2007 °[employer]® Patient Information ©2015 [employer]. This information is not intended to replace advice given to you by your health care provider. Make sure you discuss any questions you have with your health care provider. ° °

## 2014-06-28 NOTE — Evaluation (Signed)
Physical Therapy Evaluation Patient Details Name: [name] [name] MRN: [id number] DOB: [birth year] Today's Date: 06/28/2014   History of Present Illness  [name] [name] is a 73 y.o. female with history of NASH cirrhosis, presents to the emergency room with recurrent loss. Patient reports that she has been unsteady on her feet and fallen approximately 3-4 times a week for the last several weeks. She fell this morning on her right side and was brought to the emergency room for evaluation. She reports that she has been compliant with her lactulose use, but her family feels that she has not been taking it regularly. She's not had any cough, shortness of breath, fever, dysuria, abdominal pain or any other complaints. In the emergency room she was evaluated with CT scan of the head and cervical spine were unremarkable. Chest x-ray did not show any acute findings. She has chronic nonunion of right humerus fracture. Ammonia levels was noted to be elevated. She's being admitted for further treatment of hepatic encephalopathy.  Clinical Impression  Pt is a 73 year old female who presents to PT with dx of hepatic encephalopathy.  Pt reports she was receiving OOPT services ~2 months ago for increased strengthening and gait with RW, though recently has noticed increased weakness.  Increased falls reported to 3-4x/wk, with falls previously 4-5x/month.  During evaluation, pt was mod (I) for bed mobility skills, min assist for transfers, and min guard and use of RW for gait of 16 feet.  Recommend continued PT while in the hospital to address strengthening, activity tolerance, and balance for improved functional mobility skills with transition to HHPT at discharge.  No DME recommendations at this time.      Follow Up Recommendations Home health PT    Equipment Recommendations  None recommended by PT       Precautions / Restrictions Precautions Precautions: Fall Restrictions Weight Bearing Restrictions: No       Mobility  Bed Mobility Overal bed mobility: Modified Independent                Transfers Overall transfer level: Needs assistance Equipment used: Rolling walker (2 wheeled) Transfers: Sit to/from Stand Sit to Stand: Min assist            Ambulation/Gait Ambulation/Gait assistance: Min guard Ambulation Distance (Feet): 16 Feet Assistive device: Rolling walker (2 wheeled) Gait Pattern/deviations: Step-through pattern;Decreased step length - right;Decreased step length - left   Gait velocity interpretation: Below normal speed for age/gender       Balance Overall balance assessment: History of Falls;Needs assistance (Falls 3-4 week for last month, 4-5 month for the past year) Sitting-balance support: Feet supported;No upper extremity supported Sitting balance-Leahy Scale: Good     Standing balance support: Bilateral upper extremity supported;During functional activity Standing balance-Leahy Scale: Fair                               Pertinent Vitals/Pain Pain Assessment: 0-10 Pain Score: 6  Pain Location: Chronic Rt shoulder pain from nonunion fx of Rt humerus Pain Descriptors / Indicators: Aching Pain Intervention(s): Repositioned    Home Living Family/patient expects to be discharged to:: Private residence Living Arrangements: Spouse/significant other (Grandson lives next door) Available Help at Discharge: Family;Available 24 hours/day Type of Home: House Home Access: Stairs to enter Entrance Stairs-Rails: Can reach both Entrance Stairs-Number of Steps: 3-4 Home Layout: One level Home Equipment: Walker - 2 wheels;Wheelchair - Regulatory affairs officer - single point;Bedside  commode Additional Comments: Tub shower    Prior Function Level of Independence: Independent with assistive device(s);Needs assistance   Gait / Transfers Assistance Needed: Pt reports she is mod (I) with bed mobilty skills, transfers, and household amb, though recently  has not been amb very much due to falls/weakness.  Pt was recieving OOPT services for strengthening ~2 months per pt report.            Hand Dominance   Dominant Hand: Right    Extremity/Trunk Assessment               Lower Extremity Assessment: Generalized weakness         Communication   Communication: No difficulties  Cognition Arousal/Alertness: Awake/alert Behavior During Therapy: WFL for tasks assessed/performed Overall Cognitive Status: Within Functional Limits for tasks assessed                        Assessment/Plan    PT Assessment Patient needs continued PT services  PT Diagnosis Difficulty walking;Generalized weakness   PT Problem List Decreased strength;Decreased activity tolerance;Decreased mobility;Decreased balance  PT Treatment Interventions Balance training;Gait training;Neuromuscular re-education;Functional mobility training;Therapeutic activities;Therapeutic exercise;Stair training;Patient/family education   PT Goals (Current goals can be found in the Care Plan section) Acute Rehab PT Goals Patient Stated Goal: get stronger PT Goal Formulation: With patient Time For Goal Achievement: 07/12/14 Potential to Achieve Goals: Good    Frequency Min 3X/week    End of Session Equipment Utilized During Treatment: Gait belt Activity Tolerance: Patient limited by fatigue Patient left: in bed;with call bell/phone within reach;with bed alarm set           Time: 6553-7482 PT Time Calculation (min): 15 min   Charges:   PT Evaluation $Initial PT Evaluation Tier I: 1 Procedure      Ketara Cavness 06/28/2014, 9:39 AM

## 2014-07-12 ENCOUNTER — Emergency Department (HOSPITAL_COMMUNITY): Payer: Medicare Other

## 2014-07-12 ENCOUNTER — Inpatient Hospital Stay (HOSPITAL_COMMUNITY)
Admission: EM | Admit: 2014-07-12 | Discharge: 2014-07-14 | DRG: 948 | Disposition: A | Discharge status: Skilled Nursing Facility | Payer: Medicare Other | Attending: Family Medicine | Admitting: Family Medicine

## 2014-07-12 ENCOUNTER — Encounter (HOSPITAL_COMMUNITY): Payer: Self-pay | Admitting: Emergency Medicine

## 2014-07-12 ENCOUNTER — Inpatient Hospital Stay (HOSPITAL_COMMUNITY): Payer: Medicare Other

## 2014-07-12 DIAGNOSIS — E785 Hyperlipidemia, unspecified: Secondary | ICD-10-CM | POA: Diagnosis present

## 2014-07-12 DIAGNOSIS — IMO0001 Reserved for inherently not codable concepts without codable children: Secondary | ICD-10-CM

## 2014-07-12 DIAGNOSIS — K219 Gastro-esophageal reflux disease without esophagitis: Secondary | ICD-10-CM | POA: Diagnosis present

## 2014-07-12 DIAGNOSIS — J449 Chronic obstructive pulmonary disease, unspecified: Secondary | ICD-10-CM | POA: Diagnosis present

## 2014-07-12 DIAGNOSIS — E669 Obesity, unspecified: Secondary | ICD-10-CM | POA: Diagnosis present

## 2014-07-12 DIAGNOSIS — Z903 Acquired absence of stomach [part of]: Secondary | ICD-10-CM | POA: Diagnosis not present

## 2014-07-12 DIAGNOSIS — W19XXXA Unspecified fall, initial encounter: Secondary | ICD-10-CM | POA: Diagnosis present

## 2014-07-12 DIAGNOSIS — Z23 Encounter for immunization: Secondary | ICD-10-CM

## 2014-07-12 DIAGNOSIS — J4489 Other specified chronic obstructive pulmonary disease: Secondary | ICD-10-CM | POA: Diagnosis present

## 2014-07-12 DIAGNOSIS — K769 Liver disease, unspecified: Secondary | ICD-10-CM | POA: Diagnosis present

## 2014-07-12 DIAGNOSIS — M6281 Muscle weakness (generalized): Secondary | ICD-10-CM

## 2014-07-12 DIAGNOSIS — Z833 Family history of diabetes mellitus: Secondary | ICD-10-CM

## 2014-07-12 DIAGNOSIS — R5381 Other malaise: Secondary | ICD-10-CM | POA: Diagnosis not present

## 2014-07-12 DIAGNOSIS — I10 Essential (primary) hypertension: Secondary | ICD-10-CM

## 2014-07-12 DIAGNOSIS — Z8249 Family history of ischemic heart disease and other diseases of the circulatory system: Secondary | ICD-10-CM

## 2014-07-12 DIAGNOSIS — R600 Localized edema: Secondary | ICD-10-CM

## 2014-07-12 DIAGNOSIS — Z794 Long term (current) use of insulin: Secondary | ICD-10-CM

## 2014-07-12 DIAGNOSIS — Z7982 Long term (current) use of aspirin: Secondary | ICD-10-CM | POA: Diagnosis not present

## 2014-07-12 DIAGNOSIS — Z6834 Body mass index (BMI) 34.0-34.9, adult: Secondary | ICD-10-CM

## 2014-07-12 DIAGNOSIS — R5383 Other fatigue: Principal | ICD-10-CM

## 2014-07-12 DIAGNOSIS — E119 Type 2 diabetes mellitus without complications: Secondary | ICD-10-CM | POA: Diagnosis present

## 2014-07-12 DIAGNOSIS — R262 Difficulty in walking, not elsewhere classified: Secondary | ICD-10-CM

## 2014-07-12 DIAGNOSIS — R4182 Altered mental status, unspecified: Secondary | ICD-10-CM

## 2014-07-12 DIAGNOSIS — I2510 Atherosclerotic heart disease of native coronary artery without angina pectoris: Secondary | ICD-10-CM | POA: Diagnosis present

## 2014-07-12 DIAGNOSIS — K7460 Unspecified cirrhosis of liver: Secondary | ICD-10-CM | POA: Diagnosis present

## 2014-07-12 DIAGNOSIS — Z993 Dependence on wheelchair: Secondary | ICD-10-CM | POA: Diagnosis not present

## 2014-07-12 DIAGNOSIS — R531 Weakness: Secondary | ICD-10-CM

## 2014-07-12 DIAGNOSIS — R609 Edema, unspecified: Secondary | ICD-10-CM

## 2014-07-12 LAB — DIFFERENTIAL
Basophils Absolute: 0 10*3/uL (ref 0.0–0.1)
Basophils Relative: 0 % (ref 0–1)
Eosinophils Absolute: 0.1 10*3/uL (ref 0.0–0.7)
Eosinophils Relative: 2 % (ref 0–5)
LYMPHS PCT: 24 % (ref 12–46)
Lymphs Abs: 1.2 10*3/uL (ref 0.7–4.0)
MONO ABS: 0.5 10*3/uL (ref 0.1–1.0)
MONOS PCT: 10 % (ref 3–12)
NEUTROS ABS: 3.1 10*3/uL (ref 1.7–7.7)
NEUTROS PCT: 64 % (ref 43–77)

## 2014-07-12 LAB — CBC
HEMATOCRIT: 40 % (ref 36.0–46.0)
Hemoglobin: 13.4 g/dL (ref 12.0–15.0)
MCH: 26.2 pg (ref 26.0–34.0)
MCHC: 33.5 g/dL (ref 30.0–36.0)
MCV: 78.1 fL (ref 78.0–100.0)
Platelets: 149 10*3/uL — ABNORMAL LOW (ref 150–400)
RBC: 5.12 MIL/uL — AB (ref 3.87–5.11)
RDW: 14.9 % (ref 11.5–15.5)
WBC: 4.9 10*3/uL (ref 4.0–10.5)

## 2014-07-12 LAB — COMPREHENSIVE METABOLIC PANEL
ALK PHOS: 99 U/L (ref 39–117)
ALT: 10 U/L (ref 0–35)
AST: 24 U/L (ref 0–37)
Albumin: 3.4 g/dL — ABNORMAL LOW (ref 3.5–5.2)
Anion gap: 10 (ref 5–15)
BILIRUBIN TOTAL: 1.5 mg/dL — AB (ref 0.3–1.2)
BUN: 17 mg/dL (ref 6–23)
CHLORIDE: 99 meq/L (ref 96–112)
CO2: 29 mEq/L (ref 19–32)
Calcium: 9 mg/dL (ref 8.4–10.5)
Creatinine, Ser: 0.97 mg/dL (ref 0.50–1.10)
GFR calc Af Amer: 67 mL/min — ABNORMAL LOW (ref >90)
GFR calc non Af Amer: 57 mL/min — ABNORMAL LOW (ref >90)
Glucose, Bld: 194 mg/dL — ABNORMAL HIGH (ref 70–99)
POTASSIUM: 4.3 meq/L (ref 3.7–5.3)
Sodium: 138 mEq/L (ref 137–147)
Total Protein: 6.4 g/dL (ref 6.0–8.3)

## 2014-07-12 LAB — URINALYSIS, ROUTINE W REFLEX MICROSCOPIC
Bilirubin Urine: NEGATIVE
Glucose, UA: 100 mg/dL — AB
HGB URINE DIPSTICK: NEGATIVE
Ketones, ur: NEGATIVE mg/dL
NITRITE: NEGATIVE
PROTEIN: NEGATIVE mg/dL
Specific Gravity, Urine: 1.02 (ref 1.005–1.030)
UROBILINOGEN UA: 0.2 mg/dL (ref 0.0–1.0)
pH: 5.5 (ref 5.0–8.0)

## 2014-07-12 LAB — GLUCOSE, CAPILLARY: Glucose-Capillary: 217 mg/dL — ABNORMAL HIGH (ref 70–99)

## 2014-07-12 LAB — PROTIME-INR
INR: 1.23 (ref 0.00–1.49)
PROTHROMBIN TIME: 15.5 s — AB (ref 11.6–15.2)

## 2014-07-12 LAB — URINE MICROSCOPIC-ADD ON

## 2014-07-12 LAB — RAPID URINE DRUG SCREEN, HOSP PERFORMED
AMPHETAMINES: NOT DETECTED
Barbiturates: NOT DETECTED
Benzodiazepines: POSITIVE — AB
Cocaine: NOT DETECTED
Opiates: NOT DETECTED
TETRAHYDROCANNABINOL: NOT DETECTED

## 2014-07-12 LAB — AMMONIA: Ammonia: 54 umol/L (ref 11–60)

## 2014-07-12 LAB — APTT: APTT: 38 s — AB (ref 24–37)

## 2014-07-12 LAB — TROPONIN I: Troponin I: <0.3 ng/mL (ref <0.30)

## 2014-07-12 LAB — ETHANOL: Alcohol, Ethyl (B): <11 mg/dL (ref 0–11)

## 2014-07-12 MED ORDER — SODIUM CHLORIDE 0.9 % IJ SOLN
3.0000 mL | Freq: Two times a day (BID) | INTRAMUSCULAR | Status: DC
  Administered 2014-07-13: 3 mL via INTRAVENOUS

## 2014-07-12 MED ORDER — TRIAMCINOLONE ACETONIDE 0.1 % EX CREA
1.0000 "application " | TOPICAL_CREAM | Freq: Every day | CUTANEOUS | Status: DC | PRN
  Administered 2014-07-13: 1 via TOPICAL
  Filled 2014-07-12: qty 15

## 2014-07-12 MED ORDER — LACTULOSE 10 GM/15ML PO SOLN
30.0000 g | Freq: Three times a day (TID) | ORAL | Status: DC
  Administered 2014-07-12 – 2014-07-14 (×5): 30 g via ORAL
  Filled 2014-07-12 (×5): qty 60

## 2014-07-12 MED ORDER — ASPIRIN EC 81 MG PO TBEC
81.0000 mg | DELAYED_RELEASE_TABLET | Freq: Every morning | ORAL | Status: DC
  Administered 2014-07-13: 81 mg via ORAL
  Filled 2014-07-12: qty 1

## 2014-07-12 MED ORDER — BUDESONIDE-FORMOTEROL FUMARATE 160-4.5 MCG/ACT IN AERO
2.0000 | INHALATION_SPRAY | Freq: Every day | RESPIRATORY_TRACT | Status: DC
  Administered 2014-07-13: 2 via RESPIRATORY_TRACT
  Filled 2014-07-12: qty 6

## 2014-07-12 MED ORDER — SPIRONOLACTONE 25 MG PO TABS
50.0000 mg | ORAL_TABLET | Freq: Every morning | ORAL | Status: DC
  Administered 2014-07-13 – 2014-07-14 (×2): 50 mg via ORAL
  Filled 2014-07-12 (×2): qty 2
  Filled 2014-07-12 (×2): qty 1

## 2014-07-12 MED ORDER — TRIAMCINOLONE ACETONIDE 0.1 % EX CREA
TOPICAL_CREAM | CUTANEOUS | Status: AC
  Filled 2014-07-12: qty 15

## 2014-07-12 MED ORDER — INSULIN ASPART 100 UNIT/ML ~~LOC~~ SOLN
0.0000 [IU] | Freq: Three times a day (TID) | SUBCUTANEOUS | Status: DC
  Administered 2014-07-13: 4 [IU] via SUBCUTANEOUS
  Administered 2014-07-13 (×2): 7 [IU] via SUBCUTANEOUS
  Administered 2014-07-14: 3 [IU] via SUBCUTANEOUS

## 2014-07-12 MED ORDER — PANTOPRAZOLE SODIUM 40 MG PO TBEC
80.0000 mg | DELAYED_RELEASE_TABLET | Freq: Every day | ORAL | Status: DC
  Administered 2014-07-13 – 2014-07-14 (×2): 80 mg via ORAL
  Filled 2014-07-12 (×2): qty 2

## 2014-07-12 MED ORDER — SODIUM CHLORIDE 0.9 % IV SOLN: 250.0000 mL | INTRAVENOUS | Status: DC | PRN

## 2014-07-12 MED ORDER — SODIUM CHLORIDE 0.9 % IV BOLUS (SEPSIS)
500.0000 mL | Freq: Once | INTRAVENOUS | Status: AC
  Administered 2014-07-12: 500 mL via INTRAVENOUS

## 2014-07-12 MED ORDER — ESTRADIOL 0.1 MG/GM VA CREA
1.0000 | TOPICAL_CREAM | VAGINAL | Status: DC
  Filled 2014-07-12: qty 42.5

## 2014-07-12 MED ORDER — RIFAXIMIN 550 MG PO TABS
ORAL_TABLET | ORAL | Status: AC
  Filled 2014-07-12: qty 1

## 2014-07-12 MED ORDER — PREGABALIN 50 MG PO CAPS: 100.0000 mg | ORAL_CAPSULE | Freq: Every day | ORAL | Status: DC

## 2014-07-12 MED ORDER — RIFAXIMIN 550 MG PO TABS
550.0000 mg | ORAL_TABLET | Freq: Two times a day (BID) | ORAL | Status: DC
  Administered 2014-07-12 – 2014-07-13 (×2): 550 mg via ORAL
  Filled 2014-07-12 (×8): qty 1

## 2014-07-12 MED ORDER — LISINOPRIL 5 MG PO TABS
5.0000 mg | ORAL_TABLET | Freq: Every day | ORAL | Status: DC
Start: 2014-07-12 — End: 2014-07-14
  Administered 2014-07-14: 5 mg via ORAL
  Filled 2014-07-12 (×3): qty 1

## 2014-07-12 MED ORDER — PANTOPRAZOLE SODIUM 40 MG PO TBEC: 40.0000 mg | DELAYED_RELEASE_TABLET | Freq: Every day | ORAL | Status: DC

## 2014-07-12 MED ORDER — SODIUM CHLORIDE 0.9 % IJ SOLN
3.0000 mL | Freq: Two times a day (BID) | INTRAMUSCULAR | Status: DC
  Administered 2014-07-12 – 2014-07-13 (×2): 3 mL via INTRAVENOUS

## 2014-07-12 MED ORDER — SODIUM CHLORIDE 0.9 % IJ SOLN: 3.0000 mL | INTRAMUSCULAR | Status: DC | PRN

## 2014-07-12 MED ORDER — HEPARIN SODIUM (PORCINE) 5000 UNIT/ML IJ SOLN
5000.0000 [IU] | Freq: Three times a day (TID) | INTRAMUSCULAR | Status: DC
  Administered 2014-07-12 – 2014-07-14 (×5): 5000 [IU] via SUBCUTANEOUS
  Filled 2014-07-12 (×5): qty 1

## 2014-07-12 MED ORDER — ONDANSETRON HCL 4 MG/2ML IJ SOLN
4.0000 mg | Freq: Four times a day (QID) | INTRAMUSCULAR | Status: DC | PRN
Start: 2014-07-12 — End: 2014-07-14

## 2014-07-12 MED ORDER — ONDANSETRON HCL 4 MG PO TABS: 4.0000 mg | ORAL_TABLET | Freq: Four times a day (QID) | ORAL | Status: DC | PRN

## 2014-07-12 MED ORDER — MECLIZINE HCL 12.5 MG PO TABS: 25.0000 mg | ORAL_TABLET | Freq: Three times a day (TID) | ORAL | Status: DC | PRN

## 2014-07-12 MED ORDER — BUDESONIDE-FORMOTEROL FUMARATE 160-4.5 MCG/ACT IN AERO
INHALATION_SPRAY | RESPIRATORY_TRACT | Status: AC
  Filled 2014-07-12: qty 6

## 2014-07-12 MED ORDER — ALPRAZOLAM 0.5 MG PO TABS
0.5000 mg | ORAL_TABLET | Freq: Three times a day (TID) | ORAL | Status: DC | PRN
  Administered 2014-07-13: 0.5 mg via ORAL
  Filled 2014-07-12 (×2): qty 1

## 2014-07-12 MED ORDER — OXYCODONE HCL ER 10 MG PO T12A: 10.0000 mg | EXTENDED_RELEASE_TABLET | Freq: Two times a day (BID) | ORAL | Status: DC | PRN

## 2014-07-12 MED ORDER — PREGABALIN 50 MG PO CAPS
100.0000 mg | ORAL_CAPSULE | Freq: Every day | ORAL | Status: DC
  Administered 2014-07-13 – 2014-07-14 (×2): 100 mg via ORAL
  Filled 2014-07-12 (×2): qty 2

## 2014-07-12 MED ORDER — INSULIN ASPART 100 UNIT/ML ~~LOC~~ SOLN
0.0000 [IU] | Freq: Every day | SUBCUTANEOUS | Status: DC
  Administered 2014-07-12: 2 [IU] via SUBCUTANEOUS
  Administered 2014-07-13: 3 [IU] via SUBCUTANEOUS

## 2014-07-12 MED ORDER — FUROSEMIDE 20 MG PO TABS
20.0000 mg | ORAL_TABLET | Freq: Every morning | ORAL | Status: DC
  Administered 2014-07-13 – 2014-07-14 (×2): 20 mg via ORAL
  Filled 2014-07-12 (×2): qty 1

## 2014-07-12 NOTE — ED Notes (Signed)
Husband reports pt has generalized weakness that has been getting worse since Friday.  Reports pt has "no use" of her arms and legs.  Also reports swelling to left lower leg.

## 2014-07-12 NOTE — H&P (Signed)
[facility] History and Physical  [name] [name] VVO:[id number] DOB: 03/14/1941 DOA: 07/12/2014  Referring physician: ER. PCP: Purvis Kilts, MD   Chief Complaint: Altered mental status, left leg weakness.  HPI: [name] [name] is a 73 y.o. female  This is a 73 year old lady, with a history of hypertension, cirrhosis of the liver from [place], obesity, who presents with generalized weakness which started approximately 3 days ago and she noticed that she is now ready unable to walk. The left leg feels weaker and more swollen. She normally is in a wheelchair and transfers from the wheelchair to bed. Because of her inability to walk, she was then transferred to the emergency room for further evaluation. She is now going to be admitted for further management. The patient herself has been somewhat confused but appears clear to me.   Review of Systems:  Constitutional:  No weight loss, night sweats, Fevers, chills, fatigue.  HEENT:  No headaches, Difficulty swallowing,Tooth/dental problems,Sore throat,  No sneezing, itching, ear ache, nasal congestion, post nasal drip,  Cardio-vascular:  No chest pain, Orthopnea, PND, swelling in lower extremities, anasarca, dizziness, palpitations  GI:  No heartburn, indigestion, abdominal pain, nausea, vomiting, diarrhea, change in bowel habits, loss of appetite  Resp:  No shortness of breath with exertion or at rest. No excess mucus, no productive cough, No non-productive cough, No coughing up of blood.No change in color of mucus.No wheezing.No chest wall deformity  Skin:  no rash or lesions.  GU:  no dysuria, change in color of urine, no urgency or frequency. No flank pain.  Musculoskeletal:  No joint pain or swelling. No decreased range of motion. No back pain.  Psych:  No change in mood or affect. No depression or anxiety. No memory loss.   Past Medical History  Diagnosis Date  . CAD in native artery   . Thrombocytopenia, unspecified     . Unspecified hereditary and idiopathic peripheral neuropathy   . Unspecified fall   . Other and unspecified hyperlipidemia   . Unspecified essential hypertension   . Obesity, unspecified   . Personal history of neurosis   . Intrinsic asthma, unspecified   . Allergic rhinitis, cause unspecified   . Cirrhosis of liver     NASH, afp on 06/17/12 =3.7, per pt she had hep B vaccines in 1996, hep A in process.   . Colon adenomas 03/08/10    tcs by Dr. Gala Romney  . Hyperplastic polyps of stomach 03/08/10    tcs by Dr. Dudley Major  . Chronic gastritis 03/09/11    egd by Dr. Gala Romney  . History of hemorrhoids 03/08/10    tcs- internal and external  . Diverticula of colon 03/08/10    L side  . Esophagitis, erosive 03/08/10  . GERD (gastroesophageal reflux disease)   . Wears glasses   . Type II or unspecified type diabetes mellitus without mention of complication, not stated as uncontrolled   . Vertigo   . Pancreatitis   . Bilateral leg weakness   . Thrombocytopenia 04/12/2014  . Pancytopenia, acquired 04/12/2014  . Cirrhosis   . Abnormal EKG     hx of left anterior fasicular block on 04-09-13 ekg epic   Past Surgical History  Procedure Laterality Date  . Hysterectomy and btl      s/p  . Tumor excision  2003    rt arm and left foot  . Colonoscopy  03/08/10    Dr. [name]-->ext/int hemorrhoids, anal paipilla, rectal polyps, desc polyps, cecal polyp,  left-sided diverticula. suboptiomal prep. next TCS 02/2015. multiple adenomas  . Esophagogastroduodenoscopy  03/08/10    Dr. [name] erosive RE, small hh, antral erosions, small 1cm are of mucosal indentation along gastric body of doubtful significance, cystic nodularity of hypophyarynx, base of tongue, base of epiglottis. benign gastric biopsies  . Tubal ligation    . Esophagogastroduodenoscopy  10/08/2011    Dr. [name] esophageal varices, antral erosion. next egd 09/2013  . Foot surgery  2003    lt foot  . Eye surgery      cataracts bilateral  . Orif  humerus fracture Right 02/17/2013    Procedure: OPEN REDUCTION INTERNAL FIXATION (ORIF) RIGHT PROXIMAL HUMERUS FRACTURE;  Surgeon: [name], MD;  Location: [facility];  Service: Orthopedics;  Laterality: Right;  . Partial gastrectomy      family denies  . Esophagogastroduodenoscopy N/A 02/17/2014    Dr. Gala Romney: portal gastropathy, no varices. Due for surveillance 2017  . Breast surgery Right few yrs ago    benign area removed  . Abdominal hysterectomy    . Cardiac catheterization  02/25/2007    Dr. [name]:  normal LV systolic function, mild irregularities of LAD   Social History:  reports that she has never smoked. She has never used smokeless tobacco. She reports that she does not drink alcohol or use illicit drugs.  Allergies  Allergen Reactions  . Morphine And Related Itching  . Prochlorperazine Edisylate Other (See Comments)    Causes anxiety/numbness, pt not sure of this allergy  . Compazine [Prochlorperazine Edisylate] Anxiety    Causes anxiety & nervousness    Family History  Problem Relation Age of Onset  . Coronary artery disease      FH  . Diabetes      FH  . Heart disease Mother   . Colon cancer Neg Hx      Prior to Admission medications   Medication Sig Start Date End Date Taking? Authorizing Provider  ALPRAZolam Duanne Moron) 0.5 MG tablet Take 0.5 mg by mouth 3 (three) times daily as needed for anxiety. 08/09/12  Yes Kathie Dike, MD  aspirin (BAYER LOW STRENGTH) 81 MG EC tablet Take 81 mg by mouth every morning.    Yes Historical Provider, MD  budesonide-formoterol (SYMBICORT) 160-4.5 MCG/ACT inhaler Inhale 2 puffs into the lungs at bedtime.    Yes Historical Provider, MD  cephALEXin (KEFLEX) 250 MG capsule Take 250 mg by mouth at bedtime.    Yes Historical Provider, MD  esomeprazole (NEXIUM) 40 MG capsule Take 40 mg by mouth every morning.   Yes Historical Provider, MD  ESTRACE VAGINAL 0.1 MG/GM vaginal cream Place 1 Applicatorful vaginally every other day. At  bedtime  08/11/13  Yes Historical Provider, MD  furosemide (LASIX) 20 MG tablet Take 20 mg by mouth every morning.   Yes Historical Provider, MD  insulin aspart (NOVOLOG FLEXPEN) 100 UNIT/ML FlexPen Inject 10-19 Units into the skin 3 (three) times daily with meals. Sliding Scale   Yes Historical Provider, MD  insulin detemir (LEVEMIR) 100 UNIT/ML injection Inject 60 Units into the skin at bedtime.    Yes Historical Provider, MD  lactulose, encephalopathy, (GENERLAC) 10 GM/15ML SOLN Take 45 mLs (30 g total) by mouth 3 (three) times daily. 06/28/14  Yes Kathie Dike, MD  meclizine (ANTIVERT) 25 MG tablet Take 25 mg by mouth 3 (three) times daily as needed for dizziness or nausea.    Yes Historical Provider, MD  Multiple Vitamins-Minerals (CENTRUM SILVER PO) Take 1 tablet by mouth  daily.    Yes Historical Provider, MD  Oxycodone HCl 10 MG TABS Take 10 mg by mouth 2 (two) times daily.   Yes Historical Provider, MD  pregabalin (LYRICA) 100 MG capsule Take 100 mg by mouth daily.    Yes Historical Provider, MD  quinapril (ACCUPRIL) 5 MG tablet Take 5 mg by mouth at bedtime.   Yes Historical Provider, MD  rifaximin (XIFAXAN) 550 MG TABS tablet Take 1 tablet (550 mg total) by mouth 2 (two) times daily. 06/28/14  Yes Kathie Dike, MD  spironolactone (ALDACTONE) 50 MG tablet Take 50 mg by mouth every morning.   Yes Historical Provider, MD  triamcinolone cream (KENALOG) 0.1 % Apply 1 application topically daily as needed (for irritation).  06/26/13  Yes Historical Provider, MD   Physical Exam: Filed Vitals:   07/12/14 1404 07/12/14 1406 07/12/14 1455 07/12/14 1732  BP: 113/61 113/61 119/56   Pulse: 72 72 68   Temp: 98.4 F (36.9 C) 98.4 F (36.9 C)  98.4 F (36.9 C)  TempSrc: Oral Oral    Resp: 18  12   Height: 5\' 8"  (1.727 m) 5\' 8"  (1.727 m)    Weight: 104.327 kg (230 lb) 104.327 kg (230 lb)    SpO2: 99% 99% 96%     Wt Readings from Last 3 Encounters:  07/12/14 104.327 kg (230 lb)  06/27/14 104.51 kg  (230 lb 6.4 oz)  04/08/14 106.142 kg (234 lb)    General:  Appears calm and comfortable. Obese. Eyes: PERRL, normal lids, irises & conjunctiva ENT: grossly normal hearing, lips & tongue Neck: no LAD, masses or thyromegaly Cardiovascular: RRR, no m/r/g. No LE edema. Telemetry: SR, no arrhythmias  Respiratory: CTA bilaterally, no w/r/r. Normal respiratory effort. Abdomen: soft, ntnd Skin: no rash or induration seen on limited exam Musculoskeletal: grossly normal tone BUE/BLE Psychiatric: grossly normal mood and affect, speech fluent and appropriate Neurologic: She appears to have some weakness in the left leg. Left leg is slightly more swollen than the right leg. She also appears to be somewhat confused but does note delirious/encephalopathic.           Labs on Admission:  Basic Metabolic Panel:  Recent Labs Lab 07/12/14 1524  NA 138  K 4.3  CL 99  CO2 29  GLUCOSE 194*  BUN 17  CREATININE 0.97  CALCIUM 9.0   Liver Function Tests:  Recent Labs Lab 07/12/14 1524  AST 24  ALT 10  ALKPHOS 99  BILITOT 1.5*  PROT 6.4  ALBUMIN 3.4*   No results found for this basename: LIPASE, AMYLASE,  in the last 168 hours  Recent Labs Lab 07/12/14 1524  AMMONIA 54   CBC:  Recent Labs Lab 07/12/14 1524  WBC 4.9  NEUTROABS 3.1  HGB 13.4  HCT 40.0  MCV 78.1  PLT 149*   Cardiac Enzymes:  Recent Labs Lab 07/12/14 1524  TROPONINI <0.30    BNP (last 3 results) No results found for this basename: PROBNP,  in the last 8760 hours CBG: No results found for this basename: GLUCAP,  in the last 168 hours  Radiological Exams on Admission: Dg Chest 2 View  07/12/2014   CLINICAL DATA:  Weakness, altered mental status  EXAM: CHEST  2 VIEW  COMPARISON:  Radiograph 06/26/2014, CT 08/10/2013, radiograph 06/26/2014  FINDINGS: Enlarged cardiac silhouette is unchanged. No effusion, infiltrate, or pneumothorax. 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   IMPRESSION: 1. Cardiomegaly without acute cardiopulmonary findings.  Electronically Signed   By: Suzy Bouchard M.D.   On: 07/12/2014 16:29   Ct Head Wo Contrast  07/12/2014   CLINICAL DATA:  Generalized weakness.  No known injury.  EXAM: CT HEAD WITHOUT CONTRAST  TECHNIQUE: Contiguous axial images were obtained from the base of the skull through the vertex without intravenous contrast.  COMPARISON:  06/26/2014  FINDINGS: Ventricles are normal in configuration. There is ventricular and sulcal enlargement reflecting mild atrophy.  No parenchymal masses or mass effect. No evidence of a recent infarct. There are mild areas of white matter hypoattenuation most consistent with chronic microvascular ischemic change.  There are no extra-axial masses or abnormal fluid collections.  There is no intracranial hemorrhage.  Visualized sinuses and mastoid air cells are clear. No skull lesion.  IMPRESSION: 1. No acute intracranial abnormalities. No change from the prior study.   Electronically Signed   By: Lajean Manes M.D.   On: 07/12/2014 15:44   US Venous Img Lower Unilateral Left  07/12/2014   CLINICAL DATA:  Left lower extremity pain, edema and weakness. Evaluate for DVT.  EXAM: LEFT LOWER EXTREMITY VENOUS DOPPLER ULTRASOUND  TECHNIQUE: Gray-scale sonography with graded compression, as well as color Doppler and duplex ultrasound were performed to evaluate the lower extremity deep venous systems from the level of the common femoral vein and including the common femoral, femoral, profunda femoral, popliteal and calf veins including the posterior tibial, peroneal and gastrocnemius veins when visible. The superficial great saphenous vein was also interrogated. Spectral Doppler was utilized to evaluate flow at rest and with distal augmentation maneuvers in the common femoral, femoral and popliteal veins.  COMPARISON:  None.  FINDINGS: Common Femoral Vein: No evidence of thrombus. Normal compressibility, respiratory  phasicity and response to augmentation.  Saphenofemoral Junction: No evidence of thrombus. Normal compressibility and flow on color Doppler imaging.  Profunda Femoral Vein: No evidence of thrombus. Normal compressibility and flow on color Doppler imaging.  Femoral Vein: No evidence of thrombus. Normal compressibility, respiratory phasicity and response to augmentation.  Popliteal Vein: No evidence of thrombus. Normal compressibility, respiratory phasicity and response to augmentation.  Calf Veins: No evidence of thrombus. Normal compressibility and flow on color Doppler imaging.  Superficial Great Saphenous Vein: No evidence of thrombus. Normal compressibility and flow on color Doppler imaging.  Venous Reflux:  None.  Other Findings:  None.  IMPRESSION: No evidence of DVT within the left lower extremity.   Electronically Signed   By: Sandi Mariscal M.D.   On: 07/12/2014 16:56      Assessment/Plan   1. Left-sided weakness, unclear etiology. 2. Cirrhosis of the liver secondary to [facility]. 3. Hypertension. 4. Obesity. 5. Diabetes.  Plan: 1. Admit. 2. MRI brain scan. 3. Neurology consultation. 4. Monitor and control diabetes.  Further recommendations will depend on patient's hospital progress.   Code Status: Full code.  DVT Prophylaxis: Heparin.  Family Communication: I discussed the plan with patient at the bedside as well as the patient's husband.   Disposition Plan: Depending on progress.   Time spent: 60 minutes.  Doree Albee [facility] Pager [phone].  **Disclaimer: This note may have been dictated with voice recognition software. Similar sounding words can inadvertently be transcribed and this note may contain transcription errors which may not have been corrected upon publication of note.**

## 2014-07-12 NOTE — ED Provider Notes (Addendum)
TIME SEEN: 2:37PM  CHIEF COMPLAINT: weakness, altered mental status  HPI: [name] [name] is a 73 y.o. female with history of CAD, hypertension, hyperlipidemia, NASH with hepatic encephalopathy who presents to the Emergency Department complaining of generalized weakness onset 3 days ago. She reports similar symptoms previous but states it is worse since 3 days ago. She states she is normally able to walk because of the weakness. She also reports that she feels her left leg has been swollen. She had a fall 2 days ago with head injury. She is on aspirin but no other anticoagulation. She denies that she is having any headache, fevers or chills, chest pain or shortness of breath, abdominal pain, vomiting or diarrhea, bloody stool or melena. She is unable to tell me if she has any sensory deficits in her extremities or face. History is very limited given her altered mental status in that she is a poor historian.  PCP is Hilma Favors  ROS: Level 5 caveat for altered mental status.  PAST MEDICAL HISTORY/PAST SURGICAL HISTORY:  Past Medical History  Diagnosis Date  . CAD in native artery   . Thrombocytopenia, unspecified   . Unspecified hereditary and idiopathic peripheral neuropathy   . Unspecified fall   . Other and unspecified hyperlipidemia   . Unspecified essential hypertension   . Obesity, unspecified   . Personal history of neurosis   . Intrinsic asthma, unspecified   . Allergic rhinitis, cause unspecified   . Cirrhosis of liver     NASH, afp on 06/17/12 =3.7, per pt she had hep B vaccines in 1996, hep A in process.   . Colon adenomas 03/08/10    tcs by Dr. Gala Romney  . Hyperplastic polyps of stomach 03/08/10    tcs by Dr. Dudley Major  . Chronic gastritis 03/09/11    egd by Dr. Gala Romney  . History of hemorrhoids 03/08/10    tcs- internal and external  . Diverticula of colon 03/08/10    L side  . Esophagitis, erosive 03/08/10  . GERD (gastroesophageal reflux disease)   . Wears glasses   . Type II or  unspecified type diabetes mellitus without mention of complication, not stated as uncontrolled   . Vertigo   . Pancreatitis   . Bilateral leg weakness   . Thrombocytopenia 04/12/2014  . Pancytopenia, acquired 04/12/2014  . Cirrhosis   . Abnormal EKG     hx of left anterior fasicular block on 04-09-13 ekg epic    MEDICATIONS:  Prior to Admission medications   Medication Sig Start Date End Date Taking? Authorizing Provider  ALPRAZolam Duanne Moron) 0.5 MG tablet Take 0.5 mg by mouth 3 (three) times daily as needed for anxiety. 08/09/12   Kathie Dike, MD  aspirin (BAYER LOW STRENGTH) 81 MG EC tablet Take 81 mg by mouth every morning.     Historical Provider, MD  budesonide-formoterol (SYMBICORT) 160-4.5 MCG/ACT inhaler Inhale 2 puffs into the lungs at bedtime.     Historical Provider, MD  cephALEXin (KEFLEX) 250 MG capsule Take 250 mg by mouth at bedtime. For 30 days, filled 06/07/2014    Historical Provider, MD  esomeprazole (NEXIUM) 40 MG capsule Take 40 mg by mouth every morning.    Historical Provider, MD  ESTRACE VAGINAL 0.1 MG/GM vaginal cream Place 1 Applicatorful vaginally every other day. At  bedtime 08/11/13   Historical Provider, MD  furosemide (LASIX) 20 MG tablet Take 20 mg by mouth every morning.    Historical Provider, MD  insulin aspart (NOVOLOG FLEXPEN) 100  UNIT/ML FlexPen Inject 10-19 Units into the skin 3 (three) times daily with meals. Sliding Scale    Historical Provider, MD  insulin detemir (LEVEMIR) 100 UNIT/ML injection Inject 60 Units into the skin at bedtime.     Historical Provider, MD  lactulose, encephalopathy, (GENERLAC) 10 GM/15ML SOLN Take 45 mLs (30 g total) by mouth 3 (three) times daily. 06/28/14   Kathie Dike, MD  meclizine (ANTIVERT) 25 MG tablet Take 25 mg by mouth 3 (three) times daily as needed for dizziness or nausea.     Historical Provider, MD  Multiple Vitamins-Minerals (CENTRUM SILVER PO) Take 1 tablet by mouth daily.     Historical Provider, MD  Oxycodone  HCl 10 MG TABS Take 10 mg by mouth 2 (two) times daily.    Historical Provider, MD  pregabalin (LYRICA) 100 MG capsule Take 100 mg by mouth daily.     Historical Provider, MD  quinapril (ACCUPRIL) 5 MG tablet Take 5 mg by mouth at bedtime.    Historical Provider, MD  rifaximin (XIFAXAN) 550 MG TABS tablet Take 1 tablet (550 mg total) by mouth 2 (two) times daily. 06/28/14   Kathie Dike, MD  spironolactone (ALDACTONE) 50 MG tablet Take 50 mg by mouth every morning.    Historical Provider, MD  triamcinolone cream (KENALOG) 0.1 % Apply 1 application topically daily as needed (for irritation).  06/26/13   Historical Provider, MD    ALLERGIES:  Allergies  Allergen Reactions  . Morphine And Related Itching  . Prochlorperazine Edisylate Other (See Comments)    Causes anxiety/numbness, pt not sure of this allergy  . Compazine [Prochlorperazine Edisylate] Anxiety    Causes anxiety & nervousness    SOCIAL HISTORY:  History  Substance Use Topics  . Smoking status: Never Smoker   . Smokeless tobacco: Never Used  . Alcohol Use: No    FAMILY HISTORY: Family History  Problem Relation Age of Onset  . Coronary artery disease      FH  . Diabetes      FH  . Heart disease Mother   . Colon cancer Neg Hx     EXAM: BP 113/61  Pulse 72  Temp(Src) 98.4 F (36.9 C) (Oral)  Resp 18  Ht 5\' 8"  (1.727 m)  Wt 230 lb (104.327 kg)  BMI 34.98 kg/m2  SpO2 99% CONSTITUTIONAL: Alert and oriented x3 but takes patient a while to answer questions appropriately and will intermittently not answer questions in all; in no distress, obese HEAD: Normocephalic EYES: Conjunctivae clear, PERRL, extraocular movements intact ENT: normal nose; no rhinorrhea; moist mucous membranes; pharynx without lesions noted NECK: Supple, no meningismus, no LAD  CARD: RRR; S1 and S2 appreciated; no murmurs, no clicks, no rubs, no gallops RESP: Normal chest excursion without splinting or tachypnea; breath sounds clear and equal  bilaterally; no wheezes, no rhonchi, no rales,  ABD/GI: Normal bowel sounds; non-distended; soft, non-tender, no rebound, no guarding BACK:  The back appears normal and is non-tender to palpation, there is no CVA tenderness EXT: Normal ROM in all joints; non-tender to palpation; no edema; normal capillary refill; no cyanosis    SKIN: Normal color for age and race; warm NEURO: Patient may have a mild left-sided facial droop, she has decreased strength in her left hand and is unable to lift her left leg off the bed against gravity, unable to test sensation as patient cannot tell me if there is any difference between her extremities, unable to ambulate or stand or sit upright  on her own PSYCH: The patient's mood and manner are appropriate. Grooming and personal hygiene are appropriate.  MEDICAL DECISION MAKING: Patient here with altered mental status and what appears to be left-sided weakness. Last seen normal 3 days ago. Concern for possible infarct, intracranial hemorrhage, hepatic encephalopathy, UTI, anemia or electrolyte abnormality. We'll check labs, CT head, urine, chest x-ray. We'll give IV fluids. Patient will need admission.  ED PROGRESS: Patient's workup as far has been unremarkable. Her ammonia level is within normal limits. Urinalysis shows moderate leukocytes but also a few bacteria and squamous cells. We'll send urine culture. Chest x-ray clear. CT head shows no acute abnormality. Given patient's left-sided weakness I am concerned for stroke and feel she will need an MRI of her brain. We'll discuss with hospitalist for admission for left-sided weakness and altered mental status.   5:07 PM  Doppler negative.  D/w Dr. Anastasio Champion for admission for workup for left-sided weakness and altered mental status. Will order an MRI of her brain.    EKG Interpretation  Date/Time:  Monday July 12 2014 14:40:46 EDT Ventricular Rate:  68 PR Interval:  205 QRS Duration: 108 QT Interval:  423 QTC  Calculation: 450 R Axis:   -95 Text Interpretation:  Sinus rhythm Left anterior fascicular block Consider right ventricular hypertrophy Nonspecific T abnrm, anterolateral leads Confirmed by WARD,  DO, KRISTEN ([id number]) on 07/12/2014 3:48:37 PM          This chart was scribed for [place], DO by Edison Simon, ED Scribe. This patient was seen in room APA17/APA17.   I personally performed the services described in this documentation, which was scribed in my presence. The recorded information has been reviewed and is accurate.       [place], DO 07/12/14 [place], DO 07/12/14 1712

## 2014-07-13 ENCOUNTER — Ambulatory Visit: Payer: Medicare Other | Admitting: Gastroenterology

## 2014-07-13 DIAGNOSIS — R262 Difficulty in walking, not elsewhere classified: Secondary | ICD-10-CM

## 2014-07-13 DIAGNOSIS — R5383 Other fatigue: Principal | ICD-10-CM

## 2014-07-13 DIAGNOSIS — I10 Essential (primary) hypertension: Secondary | ICD-10-CM

## 2014-07-13 DIAGNOSIS — R5381 Other malaise: Principal | ICD-10-CM

## 2014-07-13 LAB — URINE CULTURE

## 2014-07-13 LAB — CBC
HCT: 35.3 % — ABNORMAL LOW (ref 36.0–46.0)
Hemoglobin: 11.6 g/dL — ABNORMAL LOW (ref 12.0–15.0)
MCH: 25.6 pg — ABNORMAL LOW (ref 26.0–34.0)
MCHC: 32.9 g/dL (ref 30.0–36.0)
MCV: 77.8 fL — ABNORMAL LOW (ref 78.0–100.0)
PLATELETS: 120 10*3/uL — AB (ref 150–400)
RBC: 4.54 MIL/uL (ref 3.87–5.11)
RDW: 14.7 % (ref 11.5–15.5)
WBC: 2.9 10*3/uL — ABNORMAL LOW (ref 4.0–10.5)

## 2014-07-13 LAB — COMPREHENSIVE METABOLIC PANEL
ALK PHOS: 84 U/L (ref 39–117)
ALT: 9 U/L (ref 0–35)
AST: 20 U/L (ref 0–37)
Albumin: 2.9 g/dL — ABNORMAL LOW (ref 3.5–5.2)
Anion gap: 8 (ref 5–15)
BILIRUBIN TOTAL: 1.4 mg/dL — AB (ref 0.3–1.2)
BUN: 18 mg/dL (ref 6–23)
CALCIUM: 8.5 mg/dL (ref 8.4–10.5)
CHLORIDE: 103 meq/L (ref 96–112)
CO2: 27 meq/L (ref 19–32)
Creatinine, Ser: 0.75 mg/dL (ref 0.50–1.10)
GFR, EST NON AFRICAN AMERICAN: 83 mL/min — AB (ref >90)
GLUCOSE: 219 mg/dL — AB (ref 70–99)
POTASSIUM: 3.7 meq/L (ref 3.7–5.3)
SODIUM: 138 meq/L (ref 137–147)
Total Protein: 5.4 g/dL — ABNORMAL LOW (ref 6.0–8.3)

## 2014-07-13 LAB — GLUCOSE, CAPILLARY
GLUCOSE-CAPILLARY: 246 mg/dL — AB (ref 70–99)
GLUCOSE-CAPILLARY: 273 mg/dL — AB (ref 70–99)
Glucose-Capillary: 185 mg/dL — ABNORMAL HIGH (ref 70–99)
Glucose-Capillary: 208 mg/dL — ABNORMAL HIGH (ref 70–99)

## 2014-07-13 MED ORDER — SODIUM CHLORIDE 0.9 % IV SOLN
250.0000 mL | INTRAVENOUS | Status: DC | PRN
  Administered 2014-07-13: 250 mL via INTRAVENOUS

## 2014-07-13 MED ORDER — PNEUMOCOCCAL VAC POLYVALENT 25 MCG/0.5ML IJ INJ
0.5000 mL | INJECTION | INTRAMUSCULAR | Status: DC
  Filled 2014-07-13: qty 0.5

## 2014-07-13 MED ORDER — ESTROGENS, CONJUGATED 0.625 MG/GM VA CREA
1.0000 | TOPICAL_CREAM | VAGINAL | Status: DC
  Administered 2014-07-13: 1 via VAGINAL
  Filled 2014-07-13: qty 42.5

## 2014-07-13 NOTE — Progress Notes (Signed)
Inpatient Diabetes Program Recommendations  AACE/ADA: New Consensus Statement on Inpatient Glycemic Control (2013)  Target Ranges:  Prepandial:   less than 140 mg/dL      Peak postprandial:   less than 180 mg/dL (1-2 hours)      Critically ill patients:  140 - 180 mg/dL   Results for [name] [name], [name] [name] (MRN [id number]) as of 07/13/2014 11:00  Ref. Range 07/12/2014 15:24 07/13/2014 05:41  Glucose Latest Range: 70-99 mg/dL 194 (H) 219 (H)   Diabetes history: DM2 Outpatient Diabetes medications: Levemir 60 units QHS, Novolog 10-19 units TID with meals Current orders for Inpatient glycemic control: Novolog 0-20 units AC, Novolog 0-5 units HS  Inpatient Diabetes Program Recommendations Insulin - Basal: Please consider ordering low dose basal insulin; recommend starting with Levemir 10 units QHS (based on 104 kg x 0.1 units).   Thanks, Barnie Alderman, RN, MSN, CCRN Diabetes Coordinator Inpatient Diabetes Program [phone] (Team Pager) [phone] ([facility] office) [phone] [facility] office)

## 2014-07-13 NOTE — Consult Note (Signed)
[name] A. Merlene Laughter, MD     [email or web address]          [name] [name] is an 73 y.o. female.   ASSESSMENT/PLAN: 1. Acute gait impairment with negative workup. The semiology is most likely due to an acute infarct despite the negative MRI. This rarely happens but has been reported. The workup is otherwise negative. The examination is also unrevealing. There is no convincing evidence of rip rowing neuropathy, myelopathy, myopathy or parkinsonism to explain the patient's symptoms. Metabolic and toxic etiologies are also unlikely given the relatively unremarkable labs. Physical and occupational therapist suggested. We'll increase the aspirin to 325 mg. ( Risk factors age, hypertension and dyslipidemia)   The patient is 73 year old white female who was seen in the hospital about 2 weeks ago for generalized weakness. 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 She did present at that time with gait instability and falling although she seems not to recall this. She simply tells me that she presented because of elevated ammonia level. On Saturday 3 days ago, she developed the relatively acute onset of gait instability and falling again. She presented to the emergency room for further evaluation. She presented a couple days out and was not a candidate for any type of interventional therapy including TPA. Last known time was started morning. She denies chest pain, shortness of breath, numbness or headaches. There is are no reports of dysarthria or dysphasia. . She is noted to have left leg weakness but tells me that this has been a chronic issue. She does not seem to think that it has gotten worse per se. She does have right shoulder pain up around due to her fracture. There is also a chronic issue. The review of systems otherwise negative.  GENERAL: This is a pleasant female in no acute distress.  HEENT: Supple. Atraumatic  normocephalic.   ABDOMEN: soft  EXTREMITIES: Mild edema of the upper extremities. She does have a downward drift of the right upper extremity and reduced range of motion due to significant pain. The pain involves the shoulder.  BACK: Normal.  SKIN: Normal by inspection.    MENTAL STATUS: Alert and oriented. Speech, language and cognition are generally intact. Judgment and insight normal.   CRANIAL NERVES: Pupils are equal, round and reactive to light; extra ocular movements are full, there is no significant nystagmus; visual fields are full-- however, there appears to be some extinguishing on the left side to double simultaneous stimulation; upper and lower facial muscles are normal in strength and symmetric, there is no flattening of the nasolabial folds; tongue is midline; uvula is midline; shoulder elevation is normal.  MOTOR: Right upper extremity is weak at the deltoid due to pain. It is graded as 4 minus/5. Hand grip 5. Right leg shows normal tone, bulk and strength. Left lower extremity 4/5. Left upper extremity 4+ to 5.  COORDINATION: Left finger to nose is normal, right finger to nose is normal, No rest tremor; no intention tremor; no postural tremor; no bradykinesia.  REFLEXES: Deep tendon reflexes are symmetrical and normal. Babinski reflexes are flexor bilaterally.   SENSATION: Normal to light touch.  GAIT: Not tested.   Past Medical History  Diagnosis Date  . CAD in native artery   . Thrombocytopenia, unspecified   . Unspecified hereditary and idiopathic peripheral neuropathy   . Unspecified fall   . Other and unspecified hyperlipidemia   . Unspecified essential hypertension   . Obesity,  unspecified   . Personal history of neurosis   . Intrinsic asthma, unspecified   . Allergic rhinitis, cause unspecified   . Cirrhosis of liver     NASH, afp on 06/17/12 =3.7, per pt she had hep B vaccines in 1996, hep A in process.   . Colon adenomas 03/08/10    tcs by Dr. Gala Romney  .  Hyperplastic polyps of stomach 03/08/10    tcs by Dr. Dudley Major  . Chronic gastritis 03/09/11    egd by Dr. Gala Romney  . History of hemorrhoids 03/08/10    tcs- internal and external  . Diverticula of colon 03/08/10    L side  . Esophagitis, erosive 03/08/10  . GERD (gastroesophageal reflux disease)   . Wears glasses   . Type II or unspecified type diabetes mellitus without mention of complication, not stated as uncontrolled   . Vertigo   . Pancreatitis   . Bilateral leg weakness   . Thrombocytopenia 04/12/2014  . Pancytopenia, acquired 04/12/2014  . Cirrhosis   . Abnormal EKG     hx of left anterior fasicular block on 04-09-13 ekg epic    Past Surgical History  Procedure Laterality Date  . Hysterectomy and btl      s/p  . Tumor excision  2003    rt arm and left foot  . Colonoscopy  03/08/10    Dr. [name]-->ext/int hemorrhoids, anal paipilla, rectal polyps, desc polyps, cecal polyp, left-sided diverticula. suboptiomal prep. next TCS 02/2015. multiple adenomas  . Esophagogastroduodenoscopy  03/08/10    Dr. [name] erosive RE, small hh, antral erosions, small 1cm are of mucosal indentation along gastric body of doubtful significance, cystic nodularity of hypophyarynx, base of tongue, base of epiglottis. benign gastric biopsies  . Tubal ligation    . Esophagogastroduodenoscopy  10/08/2011    Dr. [name] esophageal varices, antral erosion. next egd 09/2013  . Foot surgery  2003    lt foot  . Eye surgery      cataracts bilateral  . Orif humerus fracture Right 02/17/2013    Procedure: OPEN REDUCTION INTERNAL FIXATION (ORIF) RIGHT PROXIMAL HUMERUS FRACTURE;  Surgeon: [name], MD;  Location: [facility];  Service: Orthopedics;  Laterality: Right;  . Partial gastrectomy      family denies  . Esophagogastroduodenoscopy N/A 02/17/2014    Dr. Gala Romney: portal gastropathy, no varices. Due for surveillance 2017  . Breast surgery Right few yrs ago    benign area removed  . Abdominal hysterectomy    .  Cardiac catheterization  02/25/2007    Dr. [name]:  normal LV systolic function, mild irregularities of LAD    Family History  Problem Relation Age of Onset  . Coronary artery disease      FH  . Diabetes      FH  . Heart disease Mother   . Colon cancer Neg Hx     Social History:  reports that she has never smoked. She has never used smokeless tobacco. She reports that she does not drink alcohol or use illicit drugs.  Allergies:  Allergies  Allergen Reactions  . Morphine And Related Itching  . Prochlorperazine Edisylate Other (See Comments)    Causes anxiety/numbness, pt not sure of this allergy  . Compazine [Prochlorperazine Edisylate] Anxiety    Causes anxiety & nervousness    Medications: Prior to Admission medications   Medication Sig Start Date End Date Taking? Authorizing Provider  ALPRAZolam Duanne Moron) 0.5 MG tablet Take 0.5 mg by mouth 3 (three) times daily  as needed for anxiety. 08/09/12  Yes Kathie Dike, MD  aspirin (BAYER LOW STRENGTH) 81 MG EC tablet Take 81 mg by mouth every morning.    Yes Historical Provider, MD  budesonide-formoterol (SYMBICORT) 160-4.5 MCG/ACT inhaler Inhale 2 puffs into the lungs at bedtime.    Yes Historical Provider, MD  cephALEXin (KEFLEX) 250 MG capsule Take 250 mg by mouth at bedtime.    Yes Historical Provider, MD  esomeprazole (NEXIUM) 40 MG capsule Take 40 mg by mouth every morning.   Yes Historical Provider, MD  ESTRACE VAGINAL 0.1 MG/GM vaginal cream Place 1 Applicatorful vaginally every other day. At  bedtime 08/11/13  Yes Historical Provider, MD  furosemide (LASIX) 20 MG tablet Take 20 mg by mouth every morning.   Yes Historical Provider, MD  insulin aspart (NOVOLOG FLEXPEN) 100 UNIT/ML FlexPen Inject 10-19 Units into the skin 3 (three) times daily with meals. Sliding Scale   Yes Historical Provider, MD  insulin detemir (LEVEMIR) 100 UNIT/ML injection Inject 60 Units into the skin at bedtime.    Yes Historical Provider, MD  lactulose,  encephalopathy, (GENERLAC) 10 GM/15ML SOLN Take 45 mLs (30 g total) by mouth 3 (three) times daily. 06/28/14  Yes Kathie Dike, MD  meclizine (ANTIVERT) 25 MG tablet Take 25 mg by mouth 3 (three) times daily as needed for dizziness or nausea.    Yes Historical Provider, MD  Multiple Vitamins-Minerals (CENTRUM SILVER PO) Take 1 tablet by mouth daily.    Yes Historical Provider, MD  OxyCODONE (OXYCONTIN) 10 mg T12A 12 hr tablet Take 10 mg by mouth 2 (two) times daily as needed (severe pain).   Yes Historical Provider, MD  pregabalin (LYRICA) 100 MG capsule Take 100 mg by mouth daily.    Yes Historical Provider, MD  quinapril (ACCUPRIL) 5 MG tablet Take 5 mg by mouth at bedtime.   Yes Historical Provider, MD  rifaximin (XIFAXAN) 550 MG TABS tablet Take 1 tablet (550 mg total) by mouth 2 (two) times daily. 06/28/14  Yes Kathie Dike, MD  spironolactone (ALDACTONE) 50 MG tablet Take 50 mg by mouth every morning.   Yes Historical Provider, MD  triamcinolone cream (KENALOG) 0.1 % Apply 1 application topically daily as needed (for irritation).  06/26/13  Yes Historical Provider, MD    Scheduled Meds: . aspirin EC  81 mg Oral q morning - 10a  . budesonide-formoterol  2 puff Inhalation QHS  . conjugated estrogens  1 Applicatorful Vaginal H84O  . furosemide  20 mg Oral q morning - 10a  . heparin  5,000 Units Subcutaneous 3 times per day  . insulin aspart  0-20 Units Subcutaneous TID WC  . insulin aspart  0-5 Units Subcutaneous QHS  . lactulose  30 g Oral TID  . lisinopril  5 mg Oral Daily  . pantoprazole  80 mg Oral Daily  . [START ON 07/14/2014] pneumococcal 23 valent vaccine  0.5 mL Intramuscular Tomorrow-1000  . pregabalin  100 mg Oral Daily  . rifaximin  550 mg Oral BID  . sodium chloride  3 mL Intravenous Q12H  . sodium chloride  3 mL Intravenous Q12H  . spironolactone  50 mg Oral q morning - 10a   Continuous Infusions:  PRN Meds:.sodium chloride, ALPRAZolam, meclizine, ondansetron (ZOFRAN)  IV, ondansetron, OxyCODONE, sodium chloride, triamcinolone cream   Blood pressure 105/55, pulse 65, temperature 98 F (36.7 C), temperature source Oral, resp. rate 17, height '5\' 8"'  (1.727 m), weight 104.4 kg (230 lb 2.6 oz), SpO2 95.00%.  Results for orders placed during the hospital encounter of 07/12/14 (from the past 48 hour(s))  URINE RAPID DRUG SCREEN (HOSP PERFORMED)     Status: Abnormal   Collection Time    07/12/14  3:03 PM      Result Value Ref Range   Opiates NONE DETECTED  NONE DETECTED   Cocaine NONE DETECTED  NONE DETECTED   Benzodiazepines POSITIVE (*) NONE DETECTED   Amphetamines NONE DETECTED  NONE DETECTED   Tetrahydrocannabinol NONE DETECTED  NONE DETECTED   Barbiturates NONE DETECTED  NONE DETECTED   Comment:            DRUG SCREEN FOR MEDICAL PURPOSES     ONLY.  IF CONFIRMATION IS NEEDED     FOR ANY PURPOSE, NOTIFY LAB     WITHIN 5 DAYS.                LOWEST DETECTABLE LIMITS     FOR URINE DRUG SCREEN     Drug Class       Cutoff (ng/mL)     Amphetamine      1000     Barbiturate      200     Benzodiazepine   673     Tricyclics       419     Opiates          300     Cocaine          300     THC              50  URINALYSIS, ROUTINE W REFLEX MICROSCOPIC     Status: Abnormal   Collection Time    07/12/14  3:03 PM      Result Value Ref Range   Color, Urine YELLOW  YELLOW   APPearance CLEAR  CLEAR   Specific Gravity, Urine 1.020  1.005 - 1.030   pH 5.5  5.0 - 8.0   Glucose, UA 100 (*) NEGATIVE mg/dL   Hgb urine dipstick NEGATIVE  NEGATIVE   Bilirubin Urine NEGATIVE  NEGATIVE   Ketones, ur NEGATIVE  NEGATIVE mg/dL   Protein, ur NEGATIVE  NEGATIVE mg/dL   Urobilinogen, UA 0.2  0.0 - 1.0 mg/dL   Nitrite NEGATIVE  NEGATIVE   Leukocytes, UA MODERATE (*) NEGATIVE  URINE MICROSCOPIC-ADD ON     Status: Abnormal   Collection Time    07/12/14  3:03 PM      Result Value Ref Range   Squamous Epithelial / LPF FEW (*) RARE   WBC, UA 11-20  <3 WBC/hpf   RBC /  HPF 0-2  <3 RBC/hpf   Bacteria, UA FEW (*) RARE  ETHANOL     Status: None   Collection Time    07/12/14  3:24 PM      Result Value Ref Range   Alcohol, Ethyl (B) <11  0 - 11 mg/dL   Comment:            LOWEST DETECTABLE LIMIT FOR     SERUM ALCOHOL IS 11 mg/dL     FOR MEDICAL PURPOSES ONLY  PROTIME-INR     Status: Abnormal   Collection Time    07/12/14  3:24 PM      Result Value Ref Range   Prothrombin Time 15.5 (*) 11.6 - 15.2 seconds   INR 1.23  0.00 - 1.49  APTT     Status: Abnormal   Collection Time    07/12/14  3:24 PM  Result Value Ref Range   aPTT 38 (*) 24 - 37 seconds   Comment:            IF BASELINE aPTT IS ELEVATED,     SUGGEST PATIENT RISK ASSESSMENT     BE USED TO DETERMINE APPROPRIATE     ANTICOAGULANT THERAPY.  CBC     Status: Abnormal   Collection Time    07/12/14  3:24 PM      Result Value Ref Range   WBC 4.9  4.0 - 10.5 K/uL   RBC 5.12 (*) 3.87 - 5.11 MIL/uL   Hemoglobin 13.4  12.0 - 15.0 g/dL   HCT 40.0  36.0 - 46.0 %   MCV 78.1  78.0 - 100.0 fL   MCH 26.2  26.0 - 34.0 pg   MCHC 33.5  30.0 - 36.0 g/dL   RDW 14.9  11.5 - 15.5 %   Platelets 149 (*) 150 - 400 K/uL  DIFFERENTIAL     Status: None   Collection Time    07/12/14  3:24 PM      Result Value Ref Range   Neutrophils Relative % 64  43 - 77 %   Neutro Abs 3.1  1.7 - 7.7 K/uL   Lymphocytes Relative 24  12 - 46 %   Lymphs Abs 1.2  0.7 - 4.0 K/uL   Monocytes Relative 10  3 - 12 %   Monocytes Absolute 0.5  0.1 - 1.0 K/uL   Eosinophils Relative 2  0 - 5 %   Eosinophils Absolute 0.1  0.0 - 0.7 K/uL   Basophils Relative 0  0 - 1 %   Basophils Absolute 0.0  0.0 - 0.1 K/uL  COMPREHENSIVE METABOLIC PANEL     Status: Abnormal   Collection Time    07/12/14  3:24 PM      Result Value Ref Range   Sodium 138  137 - 147 mEq/L   Potassium 4.3  3.7 - 5.3 mEq/L   Chloride 99  96 - 112 mEq/L   CO2 29  19 - 32 mEq/L   Glucose, Bld 194 (*) 70 - 99 mg/dL   BUN 17  6 - 23 mg/dL   Creatinine, Ser 0.97   0.50 - 1.10 mg/dL   Calcium 9.0  8.4 - 10.5 mg/dL   Total Protein 6.4  6.0 - 8.3 g/dL   Albumin 3.4 (*) 3.5 - 5.2 g/dL   AST 24  0 - 37 U/L   ALT 10  0 - 35 U/L   Alkaline Phosphatase 99  39 - 117 U/L   Total Bilirubin 1.5 (*) 0.3 - 1.2 mg/dL   GFR calc non Af Amer 57 (*) >90 mL/min   GFR calc Af Amer 67 (*) >90 mL/min   Comment: (NOTE)     The eGFR has been calculated using the CKD EPI equation.     This calculation has not been validated in all clinical situations.     eGFR's persistently <90 mL/min signify possible Chronic Kidney     Disease.   Anion gap 10  5 - 15  TROPONIN I     Status: None   Collection Time    07/12/14  3:24 PM      Result Value Ref Range   Troponin I <0.30  <0.30 ng/mL   Comment:            Due to the release kinetics of cTnI,     a negative result within  the first hours     of the onset of symptoms does not rule out     myocardial infarction with certainty.     If myocardial infarction is still suspected,     repeat the test at appropriate intervals.  AMMONIA     Status: None   Collection Time    07/12/14  3:24 PM      Result Value Ref Range   Ammonia 54  11 - 60 umol/L  URINE CULTURE     Status: None   Collection Time    07/12/14  5:05 PM      Result Value Ref Range   Specimen Description URINE, CLEAN CATCH     Special Requests NONE     Culture  Setup Time       Value: 07/12/2014 18:50     Performed at [facility]       Value: >=100,000 COLONIES/ML     Performed at [facility]   Culture       Value: Multiple bacterial morphotypes present, none predominant. Suggest appropriate recollection if clinically indicated.     Performed at [facility]   Report Status 07/13/2014 FINAL    GLUCOSE, CAPILLARY     Status: Abnormal   Collection Time    07/12/14 10:10 PM      Result Value Ref Range   Glucose-Capillary 217 (*) 70 - 99 mg/dL   Comment 1 Notify RN    COMPREHENSIVE METABOLIC PANEL     Status:  Abnormal   Collection Time    07/13/14  5:41 AM      Result Value Ref Range   Sodium 138  137 - 147 mEq/L   Potassium 3.7  3.7 - 5.3 mEq/L   Chloride 103  96 - 112 mEq/L   CO2 27  19 - 32 mEq/L   Glucose, Bld 219 (*) 70 - 99 mg/dL   BUN 18  6 - 23 mg/dL   Creatinine, Ser 0.75  0.50 - 1.10 mg/dL   Calcium 8.5  8.4 - 10.5 mg/dL   Total Protein 5.4 (*) 6.0 - 8.3 g/dL   Albumin 2.9 (*) 3.5 - 5.2 g/dL   AST 20  0 - 37 U/L   ALT 9  0 - 35 U/L   Alkaline Phosphatase 84  39 - 117 U/L   Total Bilirubin 1.4 (*) 0.3 - 1.2 mg/dL   GFR calc non Af Amer 83 (*) >90 mL/min   GFR calc Af Amer >90  >90 mL/min   Comment: (NOTE)     The eGFR has been calculated using the CKD EPI equation.     This calculation has not been validated in all clinical situations.     eGFR's persistently <90 mL/min signify possible Chronic Kidney     Disease.   Anion gap 8  5 - 15  CBC     Status: Abnormal   Collection Time    07/13/14  5:41 AM      Result Value Ref Range   WBC 2.9 (*) 4.0 - 10.5 K/uL   RBC 4.54  3.87 - 5.11 MIL/uL   Hemoglobin 11.6 (*) 12.0 - 15.0 g/dL   HCT 35.3 (*) 36.0 - 46.0 %   MCV 77.8 (*) 78.0 - 100.0 fL   MCH 25.6 (*) 26.0 - 34.0 pg   MCHC 32.9  30.0 - 36.0 g/dL   RDW 14.7  11.5 - 15.5 %   Platelets  120 (*) 150 - 400 K/uL  GLUCOSE, CAPILLARY     Status: Abnormal   Collection Time    07/13/14  7:48 AM      Result Value Ref Range   Glucose-Capillary 185 (*) 70 - 99 mg/dL   Comment 1 Notify RN    GLUCOSE, CAPILLARY     Status: Abnormal   Collection Time    07/13/14 11:10 AM      Result Value Ref Range   Glucose-Capillary 208 (*) 70 - 99 mg/dL   Comment 1 Notify RN    GLUCOSE, CAPILLARY     Status: Abnormal   Collection Time    07/13/14  4:33 PM      Result Value Ref Range   Glucose-Capillary 246 (*) 70 - 99 mg/dL    Dg Chest 2 View  07/12/2014   CLINICAL DATA:  Weakness, altered mental status  EXAM: CHEST  2 VIEW  COMPARISON:  Radiograph 06/26/2014, CT 08/10/2013, radiograph  06/26/2014  FINDINGS: Enlarged cardiac silhouette is unchanged. No effusion, infiltrate, or pneumothorax. 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  IMPRESSION: 1. Cardiomegaly without acute cardiopulmonary findings.   Electronically Signed   By: Suzy Bouchard M.D.   On: 07/12/2014 16:29   Ct Head Wo Contrast  07/12/2014   CLINICAL DATA:  Generalized weakness.  No known injury.  EXAM: CT HEAD WITHOUT CONTRAST  TECHNIQUE: Contiguous axial images were obtained from the base of the skull through the vertex without intravenous contrast.  COMPARISON:  06/26/2014  FINDINGS: Ventricles are normal in configuration. There is ventricular and sulcal enlargement reflecting mild atrophy.  No parenchymal masses or mass effect. No evidence of a recent infarct. There are mild areas of white matter hypoattenuation most consistent with chronic microvascular ischemic change.  There are no extra-axial masses or abnormal fluid collections.  There is no intracranial hemorrhage.  Visualized sinuses and mastoid air cells are clear. No skull lesion.  IMPRESSION: 1. No acute intracranial abnormalities. No change from the prior study.   Electronically Signed   By: Lajean Manes M.D.   On: 07/12/2014 15:44   Mr Brain Wo Contrast  07/12/2014   CLINICAL DATA:  Left-sided weakness. Headache. Altered mental status.  EXAM: MRI HEAD WITHOUT CONTRAST  TECHNIQUE: Multiplanar, multiecho pulse sequences of the brain and surrounding structures were obtained without intravenous contrast.  COMPARISON:  07/12/2014 head CT.  01/22/2010 brain MR.  FINDINGS: Exam is motion degraded.  No acute infarct.  No intracranial hemorrhage.  Significant white matter changes most suggestive of result of small vessel disease.  Global atrophy without hydrocephalus.  No intracranial mass lesion noted on this unenhanced exam.  Decreased signal intensity of bone marrow may be related to patient's habitus. Correlation with CBC to exclude  anemia contributing to this appearance may be considered  Major intracranial vascular structures are patent.  Cervical medullary junction, pituitary region, pineal region and orbital structures unremarkable.  IMPRESSION: Exam is motion degraded.  No acute infarct.  Significant white matter changes most suggestive of result of small vessel disease.  Global atrophy without hydrocephalus.   Electronically Signed   By: Chauncey Cruel M.D.   On: 07/12/2014 19:40   US Venous Img Lower Unilateral Left  07/12/2014   CLINICAL DATA:  Left lower extremity pain, edema and weakness. Evaluate for DVT.  EXAM: LEFT LOWER EXTREMITY VENOUS DOPPLER ULTRASOUND  TECHNIQUE: Gray-scale sonography with graded compression, as well as color Doppler and duplex ultrasound were performed to  evaluate the lower extremity deep venous systems from the level of the common femoral vein and including the common femoral, femoral, profunda femoral, popliteal and calf veins including the posterior tibial, peroneal and gastrocnemius veins when visible. The superficial great saphenous vein was also interrogated. Spectral Doppler was utilized to evaluate flow at rest and with distal augmentation maneuvers in the common femoral, femoral and popliteal veins.  COMPARISON:  None.  FINDINGS: Common Femoral Vein: No evidence of thrombus. Normal compressibility, respiratory phasicity and response to augmentation.  Saphenofemoral Junction: No evidence of thrombus. Normal compressibility and flow on color Doppler imaging.  Profunda Femoral Vein: No evidence of thrombus. Normal compressibility and flow on color Doppler imaging.  Femoral Vein: No evidence of thrombus. Normal compressibility, respiratory phasicity and response to augmentation.  Popliteal Vein: No evidence of thrombus. Normal compressibility, respiratory phasicity and response to augmentation.  Calf Veins: No evidence of thrombus. Normal compressibility and flow on color Doppler imaging.  Superficial  Great Saphenous Vein: No evidence of thrombus. Normal compressibility and flow on color Doppler imaging.  Venous Reflux:  None.  Other Findings:  None.  IMPRESSION: No evidence of DVT within the left lower extremity.   Electronically Signed   By: Sandi Mariscal M.D.   On: 07/12/2014 16:56        Jordon Kristiansen A. Merlene [name], M.D.  Diplomate, [employer] of Psychiatry and Neurology ( Neurology). 07/13/2014, 7:10 PM

## 2014-07-13 NOTE — Evaluation (Signed)
Physical Therapy Evaluation Patient Details Name: [name] [name] MRN: [id number] DOB: June 26, 1941 Today's Date: 07/13/2014   History of Present Illness  This is a 73 year old lady, with a history of hypertension, cirrhosis of the liver from [place], obesity, who presents with generalized weakness which started approximately 3 days ago and she noticed that she is now ready unable to walk. The left leg feels weaker and more swollen. She normally is in a wheelchair and transfers from the wheelchair to bed. Because of her inability to walk, she was then transferred to the emergency room for further evaluation. She is now going to be admitted for further management. The patient herself has been somewhat confused but appears clear to me.  Clinical Impression  Pt is a 73 year old female who presents to PT with dx of Lt sided weakness.  Pt was evaluated by PT on 06/28/14 during last hospitalization, with recommendation for HHPT as pt was mod (I) with bed mobility skills, min assist for transfers, and was able to amb 16 feet with min guard.  Pt/family report a decline in functional mobility skills at home, despite HHPT.  Family reports pt has been unable to amb recently, though has been able to stand to participate in a therapeutic exercise program.  During evaluation, pt demonstrated additional deficits compared to last evaluation, including sensation, coordination, and affect/disposition changes.  Pt with limited verbal communication during evaluation, and often unable to process/follow commands given by PT.  Noted decreased functional mobility skills with pt requiring mod assist to transfer to standing, and max assist for side stepping 2 feet to HOB.  Pt demonstrates decreased standing balance and ability to sequence tasks to move RW sideways to step sideways without max assist from PT.  Unable to assess further gait skills secondary to fatigue with activity.  Recommend continued PT while in the hospital to address  strengthening, activity tolerance, coordination, and balance for improved functional mobility skills with transition to SNF at discharge.  No DME recommendations.     Follow Up Recommendations SNF    Equipment Recommendations  None recommended by PT       Precautions / Restrictions Precautions Precautions: Fall Restrictions Weight Bearing Restrictions: No      Mobility  Bed Mobility Overal bed mobility: Modified Independent             General bed mobility comments: Pt required increased time and VC to complete task.   Transfers Overall transfer level: Needs assistance Equipment used: Rolling walker (2 wheeled) Transfers: Sit to/from Stand Sit to Stand: Mod assist            Ambulation/Gait             General Gait Details: Unable to amb at this time due to decreased balance.  Pt was able to side step with max assist for movement of RW into position and to maintain upright balance as pt was leaning side to side during side stepping to [facility].      Balance Overall balance assessment: History of Falls;Needs assistance Sitting-balance support: Feet supported;Single extremity supported Sitting balance-Leahy Scale: Fair Sitting balance - Comments: Pt with fair sitting balance during assessment when at EOB.  No LOB in any direction, though pt was leaning side/side/backwards, though able to self correct before LOB occured.    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 Pain Descriptors / Indicators: Aching Pain Intervention(s): Limited activity within patient's tolerance;Repositioned    Home Living Family/patient expects to be discharged to:: Skilled nursing facility Living Arrangements: Spouse/significant other  (Grandson lives next door) Available Help at Discharge: Family;Available PRN/intermittently Type of Home: House Home Access: Stairs to enter Entrance Stairs-Rails: Can reach both Entrance Stairs-Number of Steps: 3-4 Home Layout: One level Home Equipment: Walker - 2 wheels;Wheelchair - Regulatory affairs officer - single point;Bedside commode Additional Comments: Tub shower    Prior Function     Gait / Transfers Assistance Needed: Pt reports she was mod (I) with bed mobilty skills, transfers, and household amb, though recently has not been amb very much due to falls/weaknes.  In working with [facility], pt has been unable to amb and has only been able to perform therapeutic exercises in seated/standing and transfers.            Hand Dominance   Dominant Hand: Right    Extremity/Trunk Assessment   Upper Extremity Assessment: Defer to OT evaluation           Lower Extremity Assessment: RLE deficits/detail;LLE deficits/detail RLE Deficits / Details: MMT Rt hip 4-/5, knee 4/5, ankle 4/5.  Noted difficulty with performing coordination task with LE circles with eye closed only (decreased speed with circles). Noted inconsistent sensation testing with light touch getting 2/5 correct.  LLE Deficits / Details: MMT Lt hip/knee 3+/5, ankle 4-/5.  Noted difficulty performing coordination task with LE circles with eyes open/closed (circles not smooth in pattern, more side to side movement)     Communication   Communication: No difficulties  Cognition Arousal/Alertness: Awake/alert Behavior During Therapy: WFL for tasks assessed/performed Overall Cognitive Status: Impaired/Different from baseline Area of Impairment: Following commands       Following Commands: Follows multi-step commands inconsistently;Follows one step commands inconsistently       General Comments: Slow to process commands, requiring multiple VC to repeat commands (single and multistep) for patient to be able to complete  task      Assessment/Plan    PT Assessment Patient needs continued PT services  PT Diagnosis Difficulty walking;Generalized weakness   PT Problem List Decreased strength;Decreased activity tolerance;Decreased mobility;Decreased balance;Decreased safety awareness;Decreased coordination;Impaired sensation  PT Treatment Interventions Balance training;Gait training;Neuromuscular re-education;Functional mobility training;Therapeutic activities;Therapeutic exercise;Stair training;Patient/family education   PT Goals (Current goals can be found in the Care Plan section) Acute Rehab PT Goals Patient Stated Goal: walk again PT Goal Formulation: With patient/family Time For Goal Achievement: 07/27/14 Potential to Achieve Goals: Fair    Frequency Min 3X/week   Barriers to discharge Inaccessible home environment         End of Session Equipment Utilized During Treatment: Gait belt Activity Tolerance: Patient limited by fatigue Patient left: in bed;with call bell/phone within reach;with bed alarm set           Time: 1516-1550 PT Time Calculation (min): 34 min   Charges:   PT Evaluation $Initial PT Evaluation Tier I: 1 Procedure PT Treatments $Self Care/Home Management: 8-22 (Educated pt/family on findings from PT evaluation (changes in coordination, sensation, muscle strength, functional mobility skills, balance), and recommendation for placement in SNF)    Alphonsus Doyel 07/13/2014, 4:03 PM

## 2014-07-13 NOTE — Progress Notes (Addendum)
[facility] PROGRESS NOTE  [name] [name] ZOX:[id number] DOB: September 14, 1941 DOA: 07/12/2014 PCP: Purvis Kilts, MD  Assessment/Plan: 1. Left-sided weakness. Etiology is not entirely clear, maybe a chronic process. MRI of the brain did not show any acute infarct. Neurology consultation has been requested. She may just need further strengthening with physical therapy.  2. Generalized weakness. Likely related to deconditioning. No source of infection. Chest x-ray does not show pneumonia. Urinalysis did show some white cells but urine culture did not have any specific growth. Ammonia level is within normal range. Physical therapy on skilled nursing facility placement 3. Cirrhosis secondary to NASH. Continue diuretics. Ammonia appears to be normal range. Continue lactulose and rifaximin 4. Hypertension. Continue outpatient regimen. 5. Obesity. 6. Diabetes. Continue sliding scale insulin 7. COPD. Stable. continue Symbicort  Code Status: full code Family Communication: discussed with husband at the bedside Disposition Plan: SNF discharge   Consultants:  Neurology  Procedures:    Antibiotics:    HPI/Subjective: Patient complains of feeling weak. She reports that the weakness left lower extremity has been there for several weeks.  Objective: Filed Vitals:   07/13/14 1513  BP: 105/55  Pulse: 65  Temp: 98 F (36.7 C)  Resp: 17    Intake/Output Summary (Last 24 hours) at 07/13/14 1732 Last data filed at 07/13/14 1558  Gross per 24 hour  Intake    480 ml  Output    650 ml  Net   -170 ml   Filed Weights   07/12/14 1404 07/12/14 1406 07/12/14 1918  Weight: 104.327 kg (230 lb) 104.327 kg (230 lb) 104.4 kg (230 lb 2.6 oz)    Exam:   General:  NAD  Cardiovascular: S1, S2 RRR  Respiratory: CTA B  Abdomen: soft, nt, nd, bs+  Musculoskeletal:  left lower extremity appears to be more edematous than the right lower extremity   Neuro. CN appear to be intact. RUE  4/5, LUE 5/5, LLE 4/5, RLE 5/5   Data Reviewed: Basic Metabolic Panel:  Recent Labs Lab 07/12/14 1524 07/13/14 0541  NA 138 138  K 4.3 3.7  CL 99 103  CO2 29 27  GLUCOSE 194* 219*  BUN 17 18  CREATININE 0.97 0.75  CALCIUM 9.0 8.5   Liver Function Tests:  Recent Labs Lab 07/12/14 1524 07/13/14 0541  AST 24 20  ALT 10 9  ALKPHOS 99 84  BILITOT 1.5* 1.4*  PROT 6.4 5.4*  ALBUMIN 3.4* 2.9*   No results found for this basename: LIPASE, AMYLASE,  in the last 168 hours  Recent Labs Lab 07/12/14 1524  AMMONIA 54   CBC:  Recent Labs Lab 07/12/14 1524 07/13/14 0541  WBC 4.9 2.9*  NEUTROABS 3.1  --   HGB 13.4 11.6*  HCT 40.0 35.3*  MCV 78.1 77.8*  PLT 149* 120*   Cardiac Enzymes:  Recent Labs Lab 07/12/14 1524  TROPONINI <0.30   BNP (last 3 results) No results found for this basename: PROBNP,  in the last 8760 hours CBG:  Recent Labs Lab 07/12/14 2210 07/13/14 0748 07/13/14 1110 07/13/14 1633  GLUCAP 217* 185* 208* 246*    Recent Results (from the past 240 hour(s))  URINE CULTURE     Status: None   Collection Time    07/12/14  5:05 PM      Result Value Ref Range Status   Specimen Description URINE, CLEAN CATCH   Final   Special Requests NONE   Final   Culture  Setup Time  Final   Value: 07/12/2014 18:50     Performed at [facility]     Final   Value: >=100,000 COLONIES/ML     Performed at [facility]   Culture     Final   Value: Multiple bacterial morphotypes present, none predominant. Suggest appropriate recollection if clinically indicated.     Performed at [facility]   Report Status 07/13/2014 FINAL   Final     Studies: Dg Chest 2 View  07/12/2014   CLINICAL DATA:  Weakness, altered mental status  EXAM: CHEST  2 VIEW  COMPARISON:  Radiograph 06/26/2014, CT 08/10/2013, radiograph 06/26/2014  FINDINGS: Enlarged cardiac silhouette is unchanged. No effusion, infiltrate, or pneumothorax.  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  IMPRESSION: 1. Cardiomegaly without acute cardiopulmonary findings.   Electronically Signed   By: Suzy Bouchard M.D.   On: 07/12/2014 16:29   Ct Head Wo Contrast  07/12/2014   CLINICAL DATA:  Generalized weakness.  No known injury.  EXAM: CT HEAD WITHOUT CONTRAST  TECHNIQUE: Contiguous axial images were obtained from the base of the skull through the vertex without intravenous contrast.  COMPARISON:  06/26/2014  FINDINGS: Ventricles are normal in configuration. There is ventricular and sulcal enlargement reflecting mild atrophy.  No parenchymal masses or mass effect. No evidence of a recent infarct. There are mild areas of white matter hypoattenuation most consistent with chronic microvascular ischemic change.  There are no extra-axial masses or abnormal fluid collections.  There is no intracranial hemorrhage.  Visualized sinuses and mastoid air cells are clear. No skull lesion.  IMPRESSION: 1. No acute intracranial abnormalities. No change from the prior study.   Electronically Signed   By: Lajean Manes M.D.   On: 07/12/2014 15:44   Mr Brain Wo Contrast  07/12/2014   CLINICAL DATA:  Left-sided weakness. Headache. Altered mental status.  EXAM: MRI HEAD WITHOUT CONTRAST  TECHNIQUE: Multiplanar, multiecho pulse sequences of the brain and surrounding structures were obtained without intravenous contrast.  COMPARISON:  07/12/2014 head CT.  01/22/2010 brain MR.  FINDINGS: Exam is motion degraded.  No acute infarct.  No intracranial hemorrhage.  Significant white matter changes most suggestive of result of small vessel disease.  Global atrophy without hydrocephalus.  No intracranial mass lesion noted on this unenhanced exam.  Decreased signal intensity of bone marrow may be related to patient's habitus. Correlation with CBC to exclude anemia contributing to this appearance may be considered  Major intracranial vascular structures are  patent.  Cervical medullary junction, pituitary region, pineal region and orbital structures unremarkable.  IMPRESSION: Exam is motion degraded.  No acute infarct.  Significant white matter changes most suggestive of result of small vessel disease.  Global atrophy without hydrocephalus.   Electronically Signed   By: Chauncey Cruel M.D.   On: 07/12/2014 19:40   US Venous Img Lower Unilateral Left  07/12/2014   CLINICAL DATA:  Left lower extremity pain, edema and weakness. Evaluate for DVT.  EXAM: LEFT LOWER EXTREMITY VENOUS DOPPLER ULTRASOUND  TECHNIQUE: Gray-scale sonography with graded compression, as well as color Doppler and duplex ultrasound were performed to evaluate the lower extremity deep venous systems from the level of the common femoral vein and including the common femoral, femoral, profunda femoral, popliteal and calf veins including the posterior tibial, peroneal and gastrocnemius veins when visible. The superficial great saphenous vein was also interrogated. Spectral Doppler was utilized to evaluate flow at rest  and with distal augmentation maneuvers in the common femoral, femoral and popliteal veins.  COMPARISON:  None.  FINDINGS: Common Femoral Vein: No evidence of thrombus. Normal compressibility, respiratory phasicity and response to augmentation.  Saphenofemoral Junction: No evidence of thrombus. Normal compressibility and flow on color Doppler imaging.  Profunda Femoral Vein: No evidence of thrombus. Normal compressibility and flow on color Doppler imaging.  Femoral Vein: No evidence of thrombus. Normal compressibility, respiratory phasicity and response to augmentation.  Popliteal Vein: No evidence of thrombus. Normal compressibility, respiratory phasicity and response to augmentation.  Calf Veins: No evidence of thrombus. Normal compressibility and flow on color Doppler imaging.  Superficial Great Saphenous Vein: No evidence of thrombus. Normal compressibility and flow on color Doppler  imaging.  Venous Reflux:  None.  Other Findings:  None.  IMPRESSION: No evidence of DVT within the left lower extremity.   Electronically Signed   By: Sandi Mariscal M.D.   On: 07/12/2014 16:56    Scheduled Meds: . aspirin EC  81 mg Oral q morning - 10a  . budesonide-formoterol  2 puff Inhalation QHS  . conjugated estrogens  1 Applicatorful Vaginal B71I  . furosemide  20 mg Oral q morning - 10a  . heparin  5,000 Units Subcutaneous 3 times per day  . insulin aspart  0-20 Units Subcutaneous TID WC  . insulin aspart  0-5 Units Subcutaneous QHS  . lactulose  30 g Oral TID  . lisinopril  5 mg Oral Daily  . pantoprazole  80 mg Oral Daily  . [START ON 07/14/2014] pneumococcal 23 valent vaccine  0.5 mL Intramuscular Tomorrow-1000  . pregabalin  100 mg Oral Daily  . rifaximin  550 mg Oral BID  . sodium chloride  3 mL Intravenous Q12H  . sodium chloride  3 mL Intravenous Q12H  . spironolactone  50 mg Oral q morning - 10a   Continuous Infusions:   Active Problems:   DIABETES, TYPE 2   OBESITY   CIRRHOSIS   Essential hypertension   Left-sided weakness    Time spent: 28mins    Lissett Favorite  [facility] Pager [phone]. If 7PM-7AM, please contact night-coverage at [email or web address], password [facility] 07/13/2014, 5:32 PM  LOS: 1 day

## 2014-07-13 NOTE — Care Management Note (Addendum)
    Page 1 of 1   07/14/2014     11:00:10 AM CARE MANAGEMENT NOTE 07/14/2014  Patient:  [name] [name], [name] [name]   Account Number:  [id number]  Date Initiated:  07/13/2014  Documentation initiated by:  CHILDRESS,JESSICA  Subjective/Objective Assessment:   PT IS FROM [facility] WITH HH RN AND PT. PT'S HUSBAND FEEL PT NEEDS PLACEMENT BECAUSE SHE IS UNABLE TO GET OUT OF BED AT HOME. PT EVAL IS PENDING. PT'S HUSBAND SAYS THEY ARE HAPPY WITH [facility]'S SERVICES AND IF HH PT IS RESUMED AT DISCHARGE WOULD     Action/Plan:   PREFER [facility]. AWAITING PT EVAL. WILL CONTINUE TO FOLLOW FOR CM NEEDS.   Anticipated DC Date:  07/14/2014   Anticipated DC Plan:  [place]  CM consult      Choice offered to / List presented to:             Status of service:  Completed, signed off Medicare Important Message given?   (If response is "NO", the following Medicare IM given date fields will be blank) Date Medicare IM given:   Medicare IM given by:   Date Additional Medicare IM given:   Additional Medicare IM given by:    Discharge Disposition:  [place]  Per UR Regulation:    If discussed at Long Length of Stay Meetings, dates discussed:    Comments:  07/14/14 [place], RN BSN CM Pt discharged to [facility] today. No CM needs noted. CSW to arrange discharge to facility.  07/13/2014 [place], RN, MCN, PCCN

## 2014-07-13 NOTE — Clinical Social Work Psychosocial (Signed)
Clinical Social Work Department BRIEF PSYCHOSOCIAL ASSESSMENT 07/13/2014  Patient:  [name] [name], [name] [name]     Account Number:  [id number]     Admit date:  07/12/2014  Clinical Social Worker:  Wyatt Haste  Date/Time:  07/13/2014 04:05 PM  Referred by:  Care Management  Date Referred:  07/13/2014 Referred for  SNF Placement   Other Referral:   Interview type:  Family Other interview type:   husband and daughter    PSYCHOSOCIAL DATA Living Status:  FAMILY Admitted from facility:   Level of care:   Primary support name:  [name] [name] Primary support relationship to patient:  SPOUSE Degree of support available:   supportive    CURRENT CONCERNS Current Concerns  Post-Acute Placement   Other Concerns:    SOCIAL WORK ASSESSMENT / PLAN CSW met with pt's husband and daughter in hallway. Pt was busy with RN and family was preparing to leave. PT just completed evaluation and recommendation is for SNF. Pt's husband reports that for the last 1-2 weeks, pt has been very weak and started becoming confused. In the past, this has usually meant that her ammonia was elevated or a UTI. Pt's husband is with her around the clock and has been assisting with almost all ADLs recently. At baseline, pt uses a wheelchair and can ambulate very short distances. She has had some falls in past. Pt went to [facility] in 2012 after she broke her arm. CSW discussed SNF process and possible copays. SNF list provided. Family requesting PNC if possible. They report pt understands need for SNF and is agreeable. Family plan for short term SNF with plan to return home after rehab.   Assessment/plan status:  Psychosocial Support/Ongoing Assessment of Needs Other assessment/ plan:   Information/referral to community resources:   SNF list    PATIENT'S/FAMILY'S RESPONSE TO PLAN OF CARE: Pt was not available at time of CSW visit. CSW will initiate bed search and follow up with pt tomorrow to discuss further.       [name] [name],  [place]

## 2014-07-13 NOTE — Clinical Social Work Placement (Signed)
Clinical Social Work Department CLINICAL SOCIAL WORK PLACEMENT NOTE 07/13/2014  Patient:  [name] [name], [name] [name]  Account Number:  [id number] Admit date:  07/12/2014  Clinical Social Worker:  [name], LCSW  Date/time:  07/13/2014 04:12 PM  Clinical Social Work is seeking post-discharge placement for this patient at the following level of care:   [place]   (*CSW will update this form in Epic as items are completed)   07/13/2014  Patient/family provided with [place] Department of Clinical Social Work's list of facilities offering this level of care within the geographic area requested by the patient (or if unable, by the patient's family).  07/13/2014  Patient/family informed of their freedom to choose among providers that offer the needed level of care, that participate in Medicare, Medicaid or managed care program needed by the patient, have an available bed and are willing to accept the patient.  07/13/2014  Patient/family informed of [facility]' ownership interest in [facility], as well as of the fact that they are under no obligation to receive care at this facility.  PASARR submitted to EDS on  PASARR number received on   FL2 transmitted to all facilities in geographic area requested by pt/family on  07/13/2014 FL2 transmitted to all facilities within larger geographic area on   Patient informed that his/her managed care company has contracts with or will negotiate with  certain facilities, including the following:     Patient/family informed of bed offers received:   Patient chooses bed at  Physician recommends and patient chooses bed at    Patient to be transferred to  on   Patient to be transferred to facility by  Patient and family notified of transfer on  Name of family member notified:    The following physician request were entered in Epic:   Additional Comments: Pt has existing pasarr.  [name], [place]

## 2014-07-13 NOTE — Progress Notes (Signed)
Patient lethargic this am,responds to voice,but goes right back to sleep,Dr J Memon notified. Will continue to monitor patient.

## 2014-07-14 ENCOUNTER — Inpatient Hospital Stay
Admission: RE | Admit: 2014-07-14 | Discharge: 2014-07-28 | Disposition: A | Discharge status: Other Acute Inpatient Hospital | Payer: Medicare Other | Source: Ambulatory Visit | Attending: Internal Medicine | Admitting: Internal Medicine

## 2014-07-14 LAB — GLUCOSE, CAPILLARY
GLUCOSE-CAPILLARY: 181 mg/dL — AB (ref 70–99)
Glucose-Capillary: 269 mg/dL — ABNORMAL HIGH (ref 70–99)
Glucose-Capillary: 283 mg/dL — ABNORMAL HIGH (ref 70–99)

## 2014-07-14 LAB — CBC
HCT: 36.6 % (ref 36.0–46.0)
HEMOGLOBIN: 12.4 g/dL (ref 12.0–15.0)
MCH: 26.1 pg (ref 26.0–34.0)
MCHC: 33.9 g/dL (ref 30.0–36.0)
MCV: 76.9 fL — ABNORMAL LOW (ref 78.0–100.0)
Platelets: 100 10*3/uL — ABNORMAL LOW (ref 150–400)
RBC: 4.76 MIL/uL (ref 3.87–5.11)
RDW: 14.4 % (ref 11.5–15.5)
WBC: 3.2 10*3/uL — AB (ref 4.0–10.5)

## 2014-07-14 MED ORDER — ALPRAZOLAM 0.5 MG PO TABS: 0.5000 mg | ORAL_TABLET | Freq: Three times a day (TID) | ORAL | Status: DC | PRN

## 2014-07-14 MED ORDER — ASPIRIN EC 325 MG PO TBEC
325.0000 mg | DELAYED_RELEASE_TABLET | Freq: Every morning | ORAL | Status: DC
  Administered 2014-07-14: 325 mg via ORAL
  Filled 2014-07-14: qty 1

## 2014-07-14 MED ORDER — OXYCODONE HCL ER 10 MG PO T12A: 10.0000 mg | EXTENDED_RELEASE_TABLET | Freq: Two times a day (BID) | ORAL | Status: DC | PRN

## 2014-07-14 MED ORDER — ASPIRIN 325 MG PO TBEC: 325.0000 mg | DELAYED_RELEASE_TABLET | Freq: Every morning | ORAL | Status: DC

## 2014-07-14 NOTE — Clinical Social Work Placement (Addendum)
Clinical Social Work Department CLINICAL SOCIAL WORK PLACEMENT NOTE 07/14/2014  Patient:  [name] [name], [name] [name]  Account Number:  [id number] Admit date:  07/12/2014  Clinical Social Worker:  [name], LCSW  Date/time:  07/13/2014 04:12 PM  Clinical Social Work is seeking post-discharge placement for this patient at the following level of care:   [place]   (*CSW will update this form in Epic as items are completed)   07/13/2014  Patient/family provided with [place] Department of Clinical Social Work's list of facilities offering this level of care within the geographic area requested by the patient (or if unable, by the patient's family).  07/13/2014  Patient/family informed of their freedom to choose among providers that offer the needed level of care, that participate in Medicare, Medicaid or managed care program needed by the patient, have an available bed and are willing to accept the patient.  07/13/2014  Patient/family informed of [facility]' ownership interest in [facility], as well as of the fact that they are under no obligation to receive care at this facility.  PASARR submitted to EDS on  PASARR number received on   FL2 transmitted to all facilities in geographic area requested by pt/family on  07/13/2014 FL2 transmitted to all facilities within larger geographic area on   Patient informed that his/her managed care company has contracts with or will negotiate with  certain facilities, including the following:     Patient/family informed of bed offers received:  07/14/2014 Patient chooses bed at [facility] Physician recommends and patient chooses bed at  [facility]  Patient to be transferred to [facility] on  07/14/2014 Patient to be transferred to facility by RN Patient and family notified of transfer on 07/14/2014 Name of family member notified:  Janace Hoard, daughter  The following physician request were entered in  Epic:   Additional Comments: Pt has existing pasarr.   [name], [place]

## 2014-07-14 NOTE — Clinical Social Work Note (Signed)
CSW met with pt at bedside. Discussed SNF and bed offer at [facility]. Pt is agreeable. CSW called and left voicemail for pt's husband and spoke with daughter, Janace Hoard. Pt to transfer with RN. D/C summary faxed.  [name], [place]

## 2014-07-14 NOTE — Progress Notes (Signed)
Patient discharge to [facility] called,and given to [employer].Vital signs stable.No c/o pain or discomfort noted.Staff transported patient to awaiting floor.

## 2014-07-14 NOTE — Discharge Summary (Signed)
Physician Discharge Summary  AVIV ROTA JJO:[id number] DOB: 05-19-1941 DOA: 07/12/2014  PCP: Purvis Kilts, MD  Admit date: 07/12/2014 Discharge date: 07/14/2014  Time spent: 40 minutes  Recommendations for Outpatient Follow-up:  1. Needs close followup with Dr. Marcelino Scot of orthopedics-this is for her right shoulder issues and surgery which will need to be readjusted as an outpatient 2. Recommend complete metabolic panel as well as CBC in about a week 3. Continue oxycodone every 12 hourly as well as Xanax-prescription given  4. 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 She states that ever since her right shoulder surgery she's been wheelchair-bound and has no unable to move around other than with the wheelchair for comfort sake as this is painful to use a walker. She was admitted for left-sided weakness and inability to walk but has been wheelchair-bound for the past 2 months. Neurology saw the patient an MRI was done which did not reveal any acute findings. It was thought her weakness is generalized and secondary to deconditioning and she will be discharged to nursing facility She's to continue her prior to admission medications without change Her pain medicines oxycodone as well as her Xanax have been refilled for  her She will need labs a week On discharge I would recommend followup with Dr. Arnoldo Morale of Gen. surgery as well start anti-of orthopedic surgery note this a request for those appointments    Procedures:  MRI brain  Consultations:  Neurology  Discharge Exam: Filed Vitals:   07/14/14 0620  BP: 113/69  Pulse: 68  Temp: 97.5 F (36.4 C)  Resp: 12    General: Alert pleasant oriented Cardiovascular: S1-S2 no murmur or gallop Respiratory: Clinically clear Able to lift left leg but with some stricture no significant pain on straight test sensory intact bilaterally power 5/5 hips knees  Discharge Instructions You were cared for by a hospitalist during your hospital stay. If you have any questions about your discharge medications or the care you received while you were in the hospital after you are discharged, you can call the unit and asked to speak with the hospitalist on call if the hospitalist that took care of you is not available. Once you are discharged, your primary care physician will handle any further medical issues. Please note that NO REFILLS for any discharge medications will be authorized once you are discharged, as it is imperative that you return to your primary care physician (or establish a relationship with a primary care physician if you do not have one) for your aftercare needs so that they can reassess your need for medications and monitor your lab values.  Discharge Instructions   Diet - low sodium heart healthy    Complete by:  As directed      Discharge instructions    Complete by:  As directed   See orders  and discharge summary     Increase activity slowly    Complete by:  As directed             Medication List    STOP taking these medications       cephALEXin 250 MG capsule  Commonly known as:  KEFLEX      TAKE these medications       ALPRAZolam 0.5 MG tablet  Commonly known as:  XANAX  Take 1 tablet (0.5 mg total) by mouth 3 (three) times  daily as needed for anxiety.     aspirin 325 MG EC tablet  Take 1 tablet (325 mg total) by mouth every morning.     budesonide-formoterol 160-4.5 MCG/ACT inhaler  Commonly known as:  SYMBICORT  Inhale 2 puffs into the lungs at bedtime.     CENTRUM SILVER PO  Take 1 tablet by mouth daily.     esomeprazole 40 MG capsule  Commonly known as:  NEXIUM  Take 40 mg by mouth every morning.     ESTRACE VAGINAL 0.1 MG/GM vaginal cream  Generic drug:  estradiol  Place 1 Applicatorful vaginally every other day. At  bedtime     furosemide 20 MG tablet  Commonly known as:  LASIX  Take 20 mg by mouth every morning.     insulin detemir 100 UNIT/ML injection  Commonly known as:  LEVEMIR  Inject 60 Units into the skin at bedtime.     lactulose (encephalopathy) 10 GM/15ML Soln  Commonly known as:  GENERLAC  Take 45 mLs (30 g total) by mouth 3 (three) times daily.     meclizine 25 MG tablet  Commonly known as:  ANTIVERT  Take 25 mg by mouth 3 (three) times daily as needed for dizziness or nausea.     NOVOLOG FLEXPEN 100 UNIT/ML FlexPen  Generic drug:  insulin aspart  Inject 10-19 Units into the skin 3 (three) times daily with meals. Sliding Scale     OxyCODONE 10 mg T12a 12 hr tablet  Commonly known as:  OXYCONTIN  Take 1 tablet (10 mg total) by mouth 2 (two) times daily as needed (severe pain).     pregabalin 100 MG capsule  Commonly known as:  LYRICA  Take 100 mg by mouth daily.     quinapril 5 MG tablet  Commonly known as:  ACCUPRIL  Take 5 mg by mouth at bedtime.     rifaximin 550 MG Tabs tablet  Commonly known as:  XIFAXAN  Take 1 tablet (550 mg total) by mouth 2 (two) times daily.     spironolactone 50 MG tablet  Commonly known as:  ALDACTONE  Take 50 mg by mouth every morning.     triamcinolone cream 0.1 %  Commonly known as:  KENALOG  Apply 1 application topically daily as needed (for irritation).       Allergies  Allergen Reactions  . Morphine And Related  Itching  . Prochlorperazine Edisylate Other (See Comments)    Causes anxiety/numbness, pt not sure of this allergy  . Compazine [Prochlorperazine Edisylate] Anxiety    Causes anxiety & nervousness      The results of significant diagnostics from this hospitalization (including imaging, microbiology, ancillary and laboratory) are listed below for reference.    Significant Diagnostic Studies: Dg Chest 1 View  06/26/2014   CLINICAL DATA:  Fall, unable to control RIGHT arm, history coronary artery disease, unspecified essential hypertension, cirrhosis, type 2 diabetes  EXAM: CHEST - 1 VIEW  COMPARISON:  04/09/2014  FINDINGS: Enlargement of cardiac silhouette.  Mediastinal contours and pulmonary vascularity normal.  Lungs clear.  No pleural effusion or pneumothorax.  No acute osseous findings.  Orthopedic hardware proximal RIGHT humerus post ORIF.  IMPRESSION: Enlargement of cardiac silhouette.  No acute abnormalities.   Electronically Signed   By: Lavonia Dana M.D.   On: 06/26/2014 14:12   Dg Chest 2 View  07/12/2014   CLINICAL DATA:  Weakness, altered mental status  EXAM: CHEST  2 VIEW  COMPARISON:  Radiograph 06/26/2014, CT 08/10/2013, radiograph 06/26/2014  FINDINGS: Enlarged cardiac silhouette is unchanged. No effusion, infiltrate, or pneumothorax. 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  IMPRESSION: 1. Cardiomegaly without acute cardiopulmonary findings.   Electronically Signed   By: Suzy Bouchard M.D.   On: 07/12/2014 16:29   Ct Head Wo Contrast  07/12/2014   CLINICAL DATA:  Generalized weakness.  No known injury.  EXAM: CT HEAD WITHOUT CONTRAST  TECHNIQUE: Contiguous axial images were obtained from the base of the skull through the vertex without intravenous contrast.  COMPARISON:  06/26/2014  FINDINGS: Ventricles are normal in configuration. There is ventricular and sulcal enlargement reflecting mild atrophy.  No parenchymal masses or mass effect. No evidence  of a recent infarct. There are mild areas of white matter hypoattenuation most consistent with chronic microvascular ischemic change.  There are no extra-axial masses or abnormal fluid collections.  There is no intracranial hemorrhage.  Visualized sinuses and mastoid air cells are clear. No skull lesion.  IMPRESSION: 1. No acute intracranial abnormalities. No change from the prior study.   Electronically Signed   By: Lajean Manes M.D.   On: 07/12/2014 15:44   Ct Head Wo Contrast  06/26/2014   CLINICAL DATA:  Fall, altered level consciousness  EXAM: CT HEAD WITHOUT CONTRAST  CT CERVICAL SPINE WITHOUT CONTRAST  TECHNIQUE: Multidetector CT imaging of the head and cervical spine was performed following the standard protocol without intravenous contrast. Multiplanar CT image reconstructions of the cervical spine were also generated.  COMPARISON:  CT 09/22/2013  FINDINGS: CT HEAD FINDINGS  No intracranial hemorrhage. No parenchymal contusion. No midline shift or mass effect. Basilar cisterns are patent. No skull base fracture. No fluid in the paranasal sinuses or mastoid air cells. Orbits are normal.  CT CERVICAL SPINE FINDINGS  No prevertebral soft tissue swelling. Normal alignment of cervical vertebral bodies. No loss of vertebral body height. Normal facet articulation. Normal craniocervical junction.  No evidence epidural or paraspinal hematoma.  There is endplate spurring anterior posteriorly at C5-C6 and C7. Joint space narrowing at these levels.  IMPRESSION: 1. No intracranial trauma. 2. No cervical spine fracture. 3. 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 No change from prior.   Electronically Signed   By: Suzy Bouchard M.D.   On: 06/26/2014 14:04   Ct Cervical Spine Wo Contrast  06/26/2014   CLINICAL DATA:  Fall, altered level consciousness  EXAM: CT HEAD WITHOUT CONTRAST  CT CERVICAL SPINE WITHOUT CONTRAST  TECHNIQUE: Multidetector CT imaging of the head and cervical spine was  performed following the standard protocol without intravenous contrast. Multiplanar CT image reconstructions of the cervical spine were also generated.  COMPARISON:  CT 09/22/2013  FINDINGS: CT HEAD FINDINGS  No intracranial hemorrhage. No parenchymal contusion. No midline shift or mass effect. Basilar cisterns are patent. No skull base fracture. No fluid in the paranasal sinuses or mastoid air cells. Orbits are normal.  CT CERVICAL SPINE  FINDINGS  No prevertebral soft tissue swelling. Normal alignment of cervical vertebral bodies. No loss of vertebral body height. Normal facet articulation. Normal craniocervical junction.  No evidence epidural or paraspinal hematoma.  There is endplate spurring anterior posteriorly at C5-C6 and C7. Joint space narrowing at these levels.  IMPRESSION: 1. No intracranial trauma. 2. No cervical spine fracture. 3. 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 No change from prior.   Electronically Signed   By: Suzy Bouchard M.D.   On: 06/26/2014 14:04   Mr Brain Wo Contrast  07/12/2014   CLINICAL DATA:  Left-sided weakness. Headache. Altered mental status.  EXAM: MRI HEAD WITHOUT CONTRAST  TECHNIQUE: Multiplanar, multiecho pulse sequences of the brain and surrounding structures were obtained without intravenous contrast.  COMPARISON:  07/12/2014 head CT.  01/22/2010 brain MR.  FINDINGS: Exam is motion degraded.  No acute infarct.  No intracranial hemorrhage.  Significant white matter changes most suggestive of result of small vessel disease.  Global atrophy without hydrocephalus.  No intracranial mass lesion noted on this unenhanced exam.  Decreased signal intensity of bone marrow may be related to patient's habitus. Correlation with CBC to exclude anemia contributing to this appearance may be considered  Major intracranial vascular structures are patent.  Cervical medullary junction, pituitary region, pineal region and orbital structures unremarkable.  IMPRESSION: Exam  is motion degraded.  No acute infarct.  Significant white matter changes most suggestive of result of small vessel disease.  Global atrophy without hydrocephalus.   Electronically Signed   By: Chauncey Cruel M.D.   On: 07/12/2014 19:40   US Venous Img Lower Unilateral Left  07/12/2014   CLINICAL DATA:  Left lower extremity pain, edema and weakness. Evaluate for DVT.  EXAM: LEFT LOWER EXTREMITY VENOUS DOPPLER ULTRASOUND  TECHNIQUE: Gray-scale sonography with graded compression, as well as color Doppler and duplex ultrasound were performed to evaluate the lower extremity deep venous systems from the level of the common femoral vein and including the common femoral, femoral, profunda femoral, popliteal and calf veins including the posterior tibial, peroneal and gastrocnemius veins when visible. The superficial great saphenous vein was also interrogated. Spectral Doppler was utilized to evaluate flow at rest and with distal augmentation maneuvers in the common femoral, femoral and popliteal veins.  COMPARISON:  None.  FINDINGS: Common Femoral Vein: No evidence of thrombus. Normal compressibility, respiratory phasicity and response to augmentation.  Saphenofemoral Junction: No evidence of thrombus. Normal compressibility and flow on color Doppler imaging.  Profunda Femoral Vein: No evidence of thrombus. Normal compressibility and flow on color Doppler imaging.  Femoral Vein: No evidence of thrombus. Normal compressibility, respiratory phasicity and response to augmentation.  Popliteal Vein: No evidence of thrombus. Normal compressibility, respiratory phasicity and response to augmentation.  Calf Veins: No evidence of thrombus. Normal compressibility and flow on color Doppler imaging.  Superficial Great Saphenous Vein: No evidence of thrombus. Normal compressibility and flow on color Doppler imaging.  Venous Reflux:  None.  Other Findings:  None.  IMPRESSION: No evidence of DVT within the left lower extremity.    Electronically Signed   By: Sandi Mariscal M.D.   On: 07/12/2014 16:56   Dg Humerus Right  06/26/2014   CLINICAL DATA:  Fall  EXAM: RIGHT HUMERUS - 2+ VIEW  COMPARISON:  CT RIGHT humerus 03/10/2014  FINDINGS: Again identified nonunion of an old proximal RIGHT humeral diaphyseal fracture.  Increased angulation identified at fracture fragments versus previous exam with interval fracture of the malleable plate  at the proximal to mid RIGHT humerus.  Shoulder and elbow joint alignments grossly normal.  Diffuse osseous demineralization.  Periprosthetic lucency is seen surrounding the most proximal screw of the distal plate fragment.  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   Electronically Signed   By: Lavonia Dana M.D.   On: 06/26/2014 14:14    Microbiology: Recent Results (from the past 240 hour(s))  URINE CULTURE     Status: None   Collection Time    07/12/14  5:05 PM      Result Value Ref Range Status   Specimen Description URINE, CLEAN CATCH   Final   Special Requests NONE   Final   Culture  Setup Time     Final   Value: 07/12/2014 18:50     Performed at [facility]     Final   Value: >=100,000 COLONIES/ML     Performed at [facility]   Culture     Final   Value: Multiple bacterial morphotypes present, none predominant. Suggest appropriate recollection if clinically indicated.     Performed at [facility]   Report Status 07/13/2014 FINAL   Final  URINE CULTURE     Status: None   Collection Time    07/12/14  9:40 PM      Result Value Ref Range Status   Specimen Description URINE, CLEAN CATCH   Final   Special Requests NONE   Final   Culture  Setup Time     Final   Value: 07/13/2014 14:47     Performed at [facility] PENDING   Incomplete   Culture     Final   Value: Culture reincubated for better  growth     Performed at [facility]   Report Status PENDING   Incomplete     Labs: Basic Metabolic Panel:  Recent Labs Lab 07/12/14 1524 07/13/14 0541  NA 138 138  K 4.3 3.7  CL 99 103  CO2 29 27  GLUCOSE 194* 219*  BUN 17 18  CREATININE 0.97 0.75  CALCIUM 9.0 8.5   Liver Function Tests:  Recent Labs Lab 07/12/14 1524 07/13/14 0541  AST 24 20  ALT 10 9  ALKPHOS 99 84  BILITOT 1.5* 1.4*  PROT 6.4 5.4*  ALBUMIN 3.4* 2.9*   No results found for this basename: LIPASE, AMYLASE,  in the last 168 hours  Recent Labs Lab 07/12/14 1524  AMMONIA 54   CBC:  Recent Labs Lab 07/12/14 1524 07/13/14 0541 07/14/14 0625  WBC 4.9 2.9* 3.2*  NEUTROABS 3.1  --   --   HGB 13.4 11.6* 12.4  HCT 40.0 35.3* 36.6  MCV 78.1 77.8* 76.9*  PLT 149* 120* 100*   Cardiac Enzymes:  Recent Labs Lab 07/12/14 1524  TROPONINI <0.30   BNP: BNP (last 3 results) No results found for this basename: PROBNP,  in the last 8760 hours CBG:  Recent Labs Lab 07/13/14 0748 07/13/14 1110 07/13/14 1633 07/13/14 2036 07/14/14 0724  GLUCAP 185* 208* 246* 273* 181*       Signed:  Nita Sells  [facility] 07/14/2014, 9:35 AM

## 2014-07-15 ENCOUNTER — Other Ambulatory Visit: Payer: Self-pay | Admitting: *Deleted

## 2014-07-15 LAB — GLUCOSE, CAPILLARY
GLUCOSE-CAPILLARY: 217 mg/dL — AB (ref 70–99)
GLUCOSE-CAPILLARY: 312 mg/dL — AB (ref 70–99)
Glucose-Capillary: 234 mg/dL — ABNORMAL HIGH (ref 70–99)

## 2014-07-15 MED ORDER — ALPRAZOLAM 0.5 MG PO TABS: ORAL_TABLET | ORAL | Status: DC

## 2014-07-15 MED ORDER — PREGABALIN 100 MG PO CAPS: ORAL_CAPSULE | ORAL | Status: DC

## 2014-07-15 NOTE — Telephone Encounter (Signed)
[facility] 

## 2014-07-16 LAB — GLUCOSE, CAPILLARY
Glucose-Capillary: 96 mg/dL (ref 70–99)
Glucose-Capillary: 199 mg/dL — ABNORMAL HIGH (ref 70–99)
Glucose-Capillary: 196 mg/dL — ABNORMAL HIGH (ref 70–99)

## 2014-07-17 LAB — GLUCOSE, CAPILLARY
Glucose-Capillary: 119 mg/dL — ABNORMAL HIGH (ref 70–99)
Glucose-Capillary: 161 mg/dL — ABNORMAL HIGH (ref 70–99)
Glucose-Capillary: 231 mg/dL — ABNORMAL HIGH (ref 70–99)

## 2014-07-17 LAB — URINE CULTURE

## 2014-07-18 LAB — GLUCOSE, CAPILLARY
GLUCOSE-CAPILLARY: 193 mg/dL — AB (ref 70–99)
Glucose-Capillary: 127 mg/dL — ABNORMAL HIGH (ref 70–99)
Glucose-Capillary: 158 mg/dL — ABNORMAL HIGH (ref 70–99)

## 2014-07-19 LAB — GLUCOSE, CAPILLARY
GLUCOSE-CAPILLARY: 188 mg/dL — AB (ref 70–99)
GLUCOSE-CAPILLARY: 195 mg/dL — AB (ref 70–99)
Glucose-Capillary: 202 mg/dL — ABNORMAL HIGH (ref 70–99)
Glucose-Capillary: 277 mg/dL — ABNORMAL HIGH (ref 70–99)

## 2014-07-20 LAB — GLUCOSE, CAPILLARY: GLUCOSE-CAPILLARY: 118 mg/dL — AB (ref 70–99)

## 2014-07-21 ENCOUNTER — Encounter: Payer: Self-pay | Admitting: Internal Medicine

## 2014-07-21 ENCOUNTER — Non-Acute Institutional Stay (SKILLED_NURSING_FACILITY): Payer: Medicare Other | Admitting: Internal Medicine

## 2014-07-21 DIAGNOSIS — R269 Unspecified abnormalities of gait and mobility: Secondary | ICD-10-CM

## 2014-07-21 DIAGNOSIS — J45909 Unspecified asthma, uncomplicated: Secondary | ICD-10-CM

## 2014-07-21 DIAGNOSIS — K7460 Unspecified cirrhosis of liver: Secondary | ICD-10-CM

## 2014-07-21 DIAGNOSIS — K7581 Nonalcoholic steatohepatitis (NASH): Principal | ICD-10-CM

## 2014-07-21 DIAGNOSIS — K7689 Other specified diseases of liver: Secondary | ICD-10-CM

## 2014-07-21 DIAGNOSIS — J4520 Mild intermittent asthma, uncomplicated: Secondary | ICD-10-CM

## 2014-07-21 LAB — GLUCOSE, CAPILLARY: GLUCOSE-CAPILLARY: 154 mg/dL — AB (ref 70–99)

## 2014-07-21 NOTE — Progress Notes (Signed)
Patient ID: [name] [name], female   DOB: [birth year], 73 y.o.   MRN: [id number]

## 2014-07-22 LAB — GLUCOSE, CAPILLARY
GLUCOSE-CAPILLARY: 185 mg/dL — AB (ref 70–99)
GLUCOSE-CAPILLARY: 216 mg/dL — AB (ref 70–99)
GLUCOSE-CAPILLARY: 245 mg/dL — AB (ref 70–99)
GLUCOSE-CAPILLARY: 268 mg/dL — AB (ref 70–99)
Glucose-Capillary: 158 mg/dL — ABNORMAL HIGH (ref 70–99)
Glucose-Capillary: 148 mg/dL — ABNORMAL HIGH (ref 70–99)
Glucose-Capillary: 231 mg/dL — ABNORMAL HIGH (ref 70–99)

## 2014-07-23 LAB — GLUCOSE, CAPILLARY
GLUCOSE-CAPILLARY: 163 mg/dL — AB (ref 70–99)
Glucose-Capillary: 294 mg/dL — ABNORMAL HIGH (ref 70–99)

## 2014-07-24 LAB — GLUCOSE, CAPILLARY
GLUCOSE-CAPILLARY: 166 mg/dL — AB (ref 70–99)
Glucose-Capillary: 156 mg/dL — ABNORMAL HIGH (ref 70–99)
Glucose-Capillary: 110 mg/dL — ABNORMAL HIGH (ref 70–99)

## 2014-07-25 LAB — GLUCOSE, CAPILLARY
GLUCOSE-CAPILLARY: 340 mg/dL — AB (ref 70–99)
Glucose-Capillary: 255 mg/dL — ABNORMAL HIGH (ref 70–99)

## 2014-07-26 LAB — GLUCOSE, CAPILLARY
GLUCOSE-CAPILLARY: 254 mg/dL — AB (ref 70–99)
GLUCOSE-CAPILLARY: 291 mg/dL — AB (ref 70–99)
Glucose-Capillary: 245 mg/dL — ABNORMAL HIGH (ref 70–99)
Glucose-Capillary: 143 mg/dL — ABNORMAL HIGH (ref 70–99)
Glucose-Capillary: 283 mg/dL — ABNORMAL HIGH (ref 70–99)
Glucose-Capillary: 326 mg/dL — ABNORMAL HIGH (ref 70–99)
Glucose-Capillary: 291 mg/dL — ABNORMAL HIGH (ref 70–99)

## 2014-07-27 LAB — GLUCOSE, CAPILLARY
GLUCOSE-CAPILLARY: 251 mg/dL — AB (ref 70–99)
GLUCOSE-CAPILLARY: 265 mg/dL — AB (ref 70–99)
Glucose-Capillary: 135 mg/dL — ABNORMAL HIGH (ref 70–99)
Glucose-Capillary: 224 mg/dL — ABNORMAL HIGH (ref 70–99)

## 2014-07-27 NOTE — Progress Notes (Addendum)
Patient ID: [name] [name], female   DOB: 06/04/1941, 73 y.o.   MRN: [id number]               HISTORY & PHYSICAL  DATE:  07/21/2014    FACILITY: [facility]    LEVEL OF CARE:   SNF   CHIEF COMPLAINT:  Admission to SNF, post stay at hospital, 07/12/2014 through 07/14/2014.  Previously in hospital 06/26/2014 through 06/28/2014.     HISTORY OF PRESENT ILLNESS:  This is a medically complex, but pleasant, 73 year-old woman who was in hospital from 06/26/2014 through 06/28/2014 with hepatic encephalopathy.  She suffers from known liver cirrhosis secondary to NASH.    She was readmitted to hospital on this occasion with lower extremity weakness.  She states over the last 2-3 months that she has been unable to do anything on her feet.  She has basically been spending time in a wheelchair.  She has been unable to move around other than to transfer from bed to wheelchair.  She attributes a lot of this to shoulder surgery she had, fracturing her right humerus, which looks to have been in April 2014.  The patient was seen by Neurology.  An MRI was done that did not show any acute findings.  Ultimately, her weakness was felt to be generalized secondary to deconditioning.    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    Obesity.    Cirrhosis secondary to NASH.    Essential hypertension.    Left-sided weakness.    Peripheral neuropathy.    Intrinsic asthma.   Gastroesophageal reflux disease.   Anxiety.   Colonic polyps.   Hyperlipidemia.    CURRENT MEDICATIONS:  Medication list is reviewed.    Xanax 1 mg three times a day p.r.n.    ASA 325 q.d.    Symbicort 2 puffs q.h.s.    Nexium 40 mg daily.    Estrace vaginal 1 application every other day at bedtime.    Lasix 20 q.d.    Levemir 60 at bedtime.    Lactulose 45 mL three times daily.    Antivert 25 mg three times daily p.r.n. for dizziness or nausea.    OxyContin 10 mg b.i.d.  as needed for severe pain.    Lyrica 100 q.d.    Accupril 5 q.d.    Xifaxan 550 b.i.d.    Aldactone 50 q.d.    SOCIAL HISTORY:    HOUSING:  The patient lives at home.  Has two stairs.   FUNCTIONAL STATUS:  Uses a walker at home, although she has mostly been bed- to wheelchair-bound.     REVIEW OF SYSTEMS:   HEENT:  She complains of an episodic dizziness which sounds more like an unsteadiness.    CHEST/RESPIRATORY:  No shortness of breath.  CARDIAC:   No chest pain.    GI:  No nausea,  vomiting or abdominal pain.   MUSCULOSKELETAL:  Extremities:  Says her legs are weak, but denies any sensory changes.    PHYSICAL EXAMINATION:   GENERAL APPEARANCE:  Very pleasant woman in no distress.   HEENT:   EYES:  No scleral icterus.    CHEST/RESPIRATORY:  Clear air entry bilaterally.   CARDIOVASCULAR:  CARDIAC:   Heart sounds are normal.  There are no murmurs.   GASTROINTESTINAL:  LIVER/SPLEEN/KIDNEYS:  No liver, no spleen is palpable.  There is no fluid wave in her abdomen.  There is really no stigmata.  CIRCULATION:  Extremities:  No edema.   MUSCULOSKELETAL:   EXTREMITIES:   RIGHT LOWER EXTREMITY:  She complains of pain in her right great toe.  It looks as though somebody removed her nail at some point, but I see no evidence of a particular issue here.   NEUROLOGICAL:    SENSATION/STRENGTH:  She has range of motion difficulties with her right shoulder.  However, she has no pronator drift.  Strength in her legs is remarkably normal.   CRANIAL NERVES:  Cerebellar testing is normal.   DEEP TENDON REFLEXES:  Reflexes are symmetric, including the knee jerks.  Toes are downgoing. BALANCE/GAIT:  Really wide-based and quite unsteady.  I find this almost surprising given the paucity of other findings.  She does not have a problem with light touch.  However, she does have a problem with position sense.    ASSESSMENT/PLAN:  Significant gait ataxia.  I think this is probably secondary to some  form of distal neuropathy.  She has peripheral neuropathy on her problem list, presumably diabetic.  I also wondered about her B12 level given the position sense abnormalities in her feet.    NASH.  There is no evidence of hepatic encephalopathy at present.  She is on both lactulose and rifaximin.    Asthma.  She is on Symbicort.  She appears to be stable from this regard.    The patient appears to be very stable.  She needs physical therapy.    Her lab work has already been checked on 07/16/2014 showing a reasonably normal comprehensive metabolic panel.  Her albumin is 3.2.  White count is 3.5, hemoglobin 11.4.   Her platelet count is 105, presumably portal hypertension.    I would say she needs aggressive physical therapy to see if she can mobilize with a walker.

## 2014-07-28 ENCOUNTER — Non-Acute Institutional Stay (SKILLED_NURSING_FACILITY): Payer: Medicare Other | Admitting: Internal Medicine

## 2014-07-28 ENCOUNTER — Inpatient Hospital Stay (HOSPITAL_COMMUNITY)
Admission: EM | Admit: 2014-07-28 | Discharge: 2014-07-31 | DRG: 439 | Disposition: A | Discharge status: Home Health | Payer: Medicare Other | Attending: Internal Medicine | Admitting: Internal Medicine

## 2014-07-28 ENCOUNTER — Observation Stay (HOSPITAL_COMMUNITY): Payer: Medicare Other

## 2014-07-28 ENCOUNTER — Encounter (HOSPITAL_COMMUNITY): Payer: Self-pay | Admitting: Emergency Medicine

## 2014-07-28 DIAGNOSIS — R29898 Other symptoms and signs involving the musculoskeletal system: Secondary | ICD-10-CM

## 2014-07-28 DIAGNOSIS — Z6834 Body mass index (BMI) 34.0-34.9, adult: Secondary | ICD-10-CM

## 2014-07-28 DIAGNOSIS — R531 Weakness: Secondary | ICD-10-CM

## 2014-07-28 DIAGNOSIS — K7682 Hepatic encephalopathy: Secondary | ICD-10-CM

## 2014-07-28 DIAGNOSIS — I2510 Atherosclerotic heart disease of native coronary artery without angina pectoris: Secondary | ICD-10-CM | POA: Diagnosis present

## 2014-07-28 DIAGNOSIS — G629 Polyneuropathy, unspecified: Secondary | ICD-10-CM

## 2014-07-28 DIAGNOSIS — N23 Unspecified renal colic: Secondary | ICD-10-CM

## 2014-07-28 DIAGNOSIS — G9340 Encephalopathy, unspecified: Secondary | ICD-10-CM

## 2014-07-28 DIAGNOSIS — R0602 Shortness of breath: Secondary | ICD-10-CM

## 2014-07-28 DIAGNOSIS — K7581 Nonalcoholic steatohepatitis (NASH): Secondary | ICD-10-CM

## 2014-07-28 DIAGNOSIS — E860 Dehydration: Secondary | ICD-10-CM

## 2014-07-28 DIAGNOSIS — K219 Gastro-esophageal reflux disease without esophagitis: Secondary | ICD-10-CM | POA: Diagnosis present

## 2014-07-28 DIAGNOSIS — R262 Difficulty in walking, not elsewhere classified: Secondary | ICD-10-CM

## 2014-07-28 DIAGNOSIS — D696 Thrombocytopenia, unspecified: Secondary | ICD-10-CM | POA: Diagnosis present

## 2014-07-28 DIAGNOSIS — K859 Acute pancreatitis without necrosis or infection, unspecified: Secondary | ICD-10-CM | POA: Diagnosis not present

## 2014-07-28 DIAGNOSIS — K851 Biliary acute pancreatitis without necrosis or infection: Secondary | ICD-10-CM | POA: Diagnosis present

## 2014-07-28 DIAGNOSIS — Z794 Long term (current) use of insulin: Secondary | ICD-10-CM

## 2014-07-28 DIAGNOSIS — K759 Inflammatory liver disease, unspecified: Secondary | ICD-10-CM | POA: Diagnosis present

## 2014-07-28 DIAGNOSIS — K7460 Unspecified cirrhosis of liver: Secondary | ICD-10-CM | POA: Diagnosis present

## 2014-07-28 DIAGNOSIS — E876 Hypokalemia: Secondary | ICD-10-CM

## 2014-07-28 DIAGNOSIS — Z833 Family history of diabetes mellitus: Secondary | ICD-10-CM

## 2014-07-28 DIAGNOSIS — R7989 Other specified abnormal findings of blood chemistry: Secondary | ICD-10-CM | POA: Diagnosis not present

## 2014-07-28 DIAGNOSIS — N6099 Unspecified benign mammary dysplasia of unspecified breast: Secondary | ICD-10-CM

## 2014-07-28 DIAGNOSIS — E785 Hyperlipidemia, unspecified: Secondary | ICD-10-CM | POA: Diagnosis present

## 2014-07-28 DIAGNOSIS — R42 Dizziness and giddiness: Secondary | ICD-10-CM

## 2014-07-28 DIAGNOSIS — F411 Generalized anxiety disorder: Secondary | ICD-10-CM | POA: Diagnosis present

## 2014-07-28 DIAGNOSIS — E1142 Type 2 diabetes mellitus with diabetic polyneuropathy: Secondary | ICD-10-CM | POA: Diagnosis present

## 2014-07-28 DIAGNOSIS — D72819 Decreased white blood cell count, unspecified: Secondary | ICD-10-CM | POA: Diagnosis present

## 2014-07-28 DIAGNOSIS — G9349 Other encephalopathy: Secondary | ICD-10-CM

## 2014-07-28 DIAGNOSIS — K8070 Calculus of gallbladder and bile duct without cholecystitis without obstruction: Secondary | ICD-10-CM | POA: Diagnosis present

## 2014-07-28 DIAGNOSIS — J45909 Unspecified asthma, uncomplicated: Secondary | ICD-10-CM

## 2014-07-28 DIAGNOSIS — Z7982 Long term (current) use of aspirin: Secondary | ICD-10-CM | POA: Diagnosis not present

## 2014-07-28 DIAGNOSIS — Z87440 Personal history of urinary (tract) infections: Secondary | ICD-10-CM

## 2014-07-28 DIAGNOSIS — IMO0002 Reserved for concepts with insufficient information to code with codable children: Secondary | ICD-10-CM | POA: Diagnosis present

## 2014-07-28 DIAGNOSIS — I10 Essential (primary) hypertension: Secondary | ICD-10-CM | POA: Diagnosis present

## 2014-07-28 DIAGNOSIS — K861 Other chronic pancreatitis: Secondary | ICD-10-CM | POA: Diagnosis present

## 2014-07-28 DIAGNOSIS — J309 Allergic rhinitis, unspecified: Secondary | ICD-10-CM

## 2014-07-28 DIAGNOSIS — R296 Repeated falls: Secondary | ICD-10-CM

## 2014-07-28 DIAGNOSIS — E1165 Type 2 diabetes mellitus with hyperglycemia: Secondary | ICD-10-CM

## 2014-07-28 DIAGNOSIS — Z01818 Encounter for other preprocedural examination: Secondary | ICD-10-CM

## 2014-07-28 DIAGNOSIS — R270 Ataxia, unspecified: Secondary | ICD-10-CM

## 2014-07-28 DIAGNOSIS — M76829 Posterior tibial tendinitis, unspecified leg: Secondary | ICD-10-CM

## 2014-07-28 DIAGNOSIS — Z903 Acquired absence of stomach [part of]: Secondary | ICD-10-CM

## 2014-07-28 DIAGNOSIS — Z9181 History of falling: Secondary | ICD-10-CM

## 2014-07-28 DIAGNOSIS — E669 Obesity, unspecified: Secondary | ICD-10-CM | POA: Diagnosis present

## 2014-07-28 DIAGNOSIS — G609 Hereditary and idiopathic neuropathy, unspecified: Secondary | ICD-10-CM

## 2014-07-28 DIAGNOSIS — E1149 Type 2 diabetes mellitus with other diabetic neurological complication: Secondary | ICD-10-CM | POA: Diagnosis present

## 2014-07-28 DIAGNOSIS — R109 Unspecified abdominal pain: Secondary | ICD-10-CM | POA: Diagnosis not present

## 2014-07-28 DIAGNOSIS — K7689 Other specified diseases of liver: Secondary | ICD-10-CM | POA: Diagnosis present

## 2014-07-28 DIAGNOSIS — Z8249 Family history of ischemic heart disease and other diseases of the circulatory system: Secondary | ICD-10-CM

## 2014-07-28 DIAGNOSIS — D649 Anemia, unspecified: Secondary | ICD-10-CM | POA: Diagnosis present

## 2014-07-28 DIAGNOSIS — K7290 Hepatic failure, unspecified without coma: Secondary | ICD-10-CM

## 2014-07-28 DIAGNOSIS — E871 Hypo-osmolality and hyponatremia: Secondary | ICD-10-CM

## 2014-07-28 DIAGNOSIS — E119 Type 2 diabetes mellitus without complications: Secondary | ICD-10-CM

## 2014-07-28 DIAGNOSIS — N390 Urinary tract infection, site not specified: Secondary | ICD-10-CM | POA: Diagnosis present

## 2014-07-28 DIAGNOSIS — R945 Abnormal results of liver function studies: Secondary | ICD-10-CM

## 2014-07-28 DIAGNOSIS — D61818 Other pancytopenia: Secondary | ICD-10-CM

## 2014-07-28 DIAGNOSIS — Z86010 Personal history of colonic polyps: Secondary | ICD-10-CM

## 2014-07-28 DIAGNOSIS — R600 Localized edema: Secondary | ICD-10-CM

## 2014-07-28 DIAGNOSIS — R410 Disorientation, unspecified: Secondary | ICD-10-CM

## 2014-07-28 DIAGNOSIS — R2689 Other abnormalities of gait and mobility: Secondary | ICD-10-CM

## 2014-07-28 DIAGNOSIS — Z8659 Personal history of other mental and behavioral disorders: Secondary | ICD-10-CM

## 2014-07-28 LAB — CBC WITH DIFFERENTIAL/PLATELET
BASOS ABS: 0 10*3/uL (ref 0.0–0.1)
Basophils Relative: 0 % (ref 0–1)
EOS PCT: 2 % (ref 0–5)
Eosinophils Absolute: 0.1 10*3/uL (ref 0.0–0.7)
HCT: 40.2 % (ref 36.0–46.0)
Hemoglobin: 13.6 g/dL (ref 12.0–15.0)
LYMPHS ABS: 1.7 10*3/uL (ref 0.7–4.0)
LYMPHS PCT: 19 % (ref 12–46)
MCH: 25.9 pg — AB (ref 26.0–34.0)
MCHC: 33.8 g/dL (ref 30.0–36.0)
MCV: 76.4 fL — AB (ref 78.0–100.0)
MONO ABS: 0.7 10*3/uL (ref 0.1–1.0)
Monocytes Relative: 7 % (ref 3–12)
Neutro Abs: 6.5 10*3/uL (ref 1.7–7.7)
Neutrophils Relative %: 72 % (ref 43–77)
Platelets: 151 10*3/uL (ref 150–400)
RBC: 5.26 MIL/uL — ABNORMAL HIGH (ref 3.87–5.11)
RDW: 14.4 % (ref 11.5–15.5)
WBC: 9 10*3/uL (ref 4.0–10.5)

## 2014-07-28 LAB — LIPASE, BLOOD: LIPASE: 1952 U/L — AB (ref 11–59)

## 2014-07-28 LAB — GLUCOSE, CAPILLARY
GLUCOSE-CAPILLARY: 164 mg/dL — AB (ref 70–99)
Glucose-Capillary: 294 mg/dL — ABNORMAL HIGH (ref 70–99)

## 2014-07-28 LAB — COMPREHENSIVE METABOLIC PANEL
ALT: 14 U/L (ref 0–35)
AST: 25 U/L (ref 0–37)
Albumin: 3.8 g/dL (ref 3.5–5.2)
Alkaline Phosphatase: 99 U/L (ref 39–117)
Anion gap: 15 (ref 5–15)
BUN: 22 mg/dL (ref 6–23)
CALCIUM: 9.7 mg/dL (ref 8.4–10.5)
CO2: 25 mEq/L (ref 19–32)
CREATININE: 0.95 mg/dL (ref 0.50–1.10)
Chloride: 101 mEq/L (ref 96–112)
GFR calc non Af Amer: 59 mL/min — ABNORMAL LOW (ref >90)
GFR, EST AFRICAN AMERICAN: 68 mL/min — AB (ref >90)
GLUCOSE: 136 mg/dL — AB (ref 70–99)
Potassium: 3.9 mEq/L (ref 3.7–5.3)
Sodium: 141 mEq/L (ref 137–147)
TOTAL PROTEIN: 7 g/dL (ref 6.0–8.3)
Total Bilirubin: 1.7 mg/dL — ABNORMAL HIGH (ref 0.3–1.2)

## 2014-07-28 MED ORDER — ONDANSETRON HCL 4 MG/2ML IJ SOLN
4.0000 mg | INTRAMUSCULAR | Status: DC | PRN
  Administered 2014-07-28: 4 mg via INTRAVENOUS
  Filled 2014-07-28: qty 2

## 2014-07-28 MED ORDER — FENTANYL CITRATE 0.05 MG/ML IJ SOLN: 6.2500 ug | INTRAMUSCULAR | Status: DC | PRN

## 2014-07-28 MED ORDER — SODIUM CHLORIDE 0.9 % IV SOLN
INTRAVENOUS | Status: DC
  Administered 2014-07-28 – 2014-07-31 (×7): via INTRAVENOUS

## 2014-07-28 MED ORDER — FENTANYL CITRATE 0.05 MG/ML IJ SOLN
12.5000 ug | INTRAMUSCULAR | Status: DC | PRN
  Administered 2014-07-28: 12.5 ug via INTRAVENOUS
  Filled 2014-07-28 (×2): qty 2

## 2014-07-28 NOTE — ED Provider Notes (Signed)
CSN: [id number]     Arrival date & time 07/28/14  2000 History   First MD Initiated Contact with Patient 07/28/14 2302     Chief Complaint  Patient presents with  . Abdominal Pain      HPI Pt was seen at 2310.  Per pt and her family, c/o gradual onset and persistence of constant upper abd "pain" since this morning.  Has been associated with nausea.  Describes the abd pain as "aching."  Denies vomiting/diarrhea, no fevers, no back pain, no rash, no CP/SOB, no black or blood in stools.       Past Medical History  Diagnosis Date  . CAD in native artery   . Thrombocytopenia, unspecified   . Unspecified hereditary and idiopathic peripheral neuropathy   . Unspecified fall   . Other and unspecified hyperlipidemia   . Unspecified essential hypertension   . Obesity, unspecified   . Personal history of neurosis   . Intrinsic asthma, unspecified   . Allergic rhinitis, cause unspecified   . Cirrhosis of liver     NASH, afp on 06/17/12 =3.7, per pt she had hep B vaccines in 1996, hep A in process.   . Colon adenomas 03/08/10    tcs by Dr. Gala Romney  . Hyperplastic polyps of stomach 03/08/10    tcs by Dr. Dudley Major  . Chronic gastritis 03/09/11    egd by Dr. Gala Romney  . History of hemorrhoids 03/08/10    tcs- internal and external  . Diverticula of colon 03/08/10    L side  . Esophagitis, erosive 03/08/10  . GERD (gastroesophageal reflux disease)   . Wears glasses   . Type II or unspecified type diabetes mellitus without mention of complication, not stated as uncontrolled   . Vertigo   . Pancreatitis   . Bilateral leg weakness   . Thrombocytopenia 04/12/2014  . Pancytopenia, acquired 04/12/2014  . Cirrhosis   . Abnormal EKG     hx of left anterior fasicular block on 04-09-13 ekg epic   Past Surgical History  Procedure Laterality Date  . Hysterectomy and btl      s/p  . Tumor excision  2003    rt arm and left foot  . Colonoscopy  03/08/10    Dr. [name]-->ext/int hemorrhoids, anal paipilla, rectal  polyps, desc polyps, cecal polyp, left-sided diverticula. suboptiomal prep. next TCS 02/2015. multiple adenomas  . Esophagogastroduodenoscopy  03/08/10    Dr. [name] erosive RE, small hh, antral erosions, small 1cm are of mucosal indentation along gastric body of doubtful significance, cystic nodularity of hypophyarynx, base of tongue, base of epiglottis. benign gastric biopsies  . Tubal ligation    . Esophagogastroduodenoscopy  10/08/2011    Dr. [name] esophageal varices, antral erosion. next egd 09/2013  . Foot surgery  2003    lt foot  . Eye surgery      cataracts bilateral  . Orif humerus fracture Right 02/17/2013    Procedure: OPEN REDUCTION INTERNAL FIXATION (ORIF) RIGHT PROXIMAL HUMERUS FRACTURE;  Surgeon: [name], MD;  Location: [place];  Service: Orthopedics;  Laterality: Right;  . Partial gastrectomy      family denies  . Esophagogastroduodenoscopy N/A 02/17/2014    Dr. Gala Romney: portal gastropathy, no varices. Due for surveillance 2017  . Breast surgery Right few yrs ago    benign area removed  . Abdominal hysterectomy    . Cardiac catheterization  02/25/2007    Dr. [name]:  normal LV systolic function, mild irregularities of LAD  Family History  Problem Relation Age of Onset  . Coronary artery disease      FH  . Diabetes      FH  . Heart disease Mother   . Colon cancer Neg Hx    History  Substance Use Topics  . Smoking status: Never Smoker   . Smokeless tobacco: Never Used  . Alcohol Use: No    Review of Systems ROS: Statement: All systems negative except as marked or noted in the HPI; Constitutional: Negative for fever and chills. ; ; Eyes: Negative for eye pain, redness and discharge. ; ; ENMT: Negative for ear pain, hoarseness, nasal congestion, sinus pressure and sore throat. ; ; Cardiovascular: Negative for chest pain, palpitations, diaphoresis, dyspnea and peripheral edema. ; ; Respiratory: Negative for cough, wheezing and stridor. ; ; Gastrointestinal:  +nausea, abd pain. Negative for vomiting, diarrhea, blood in stool, hematemesis, jaundice and rectal bleeding. . ; ; Genitourinary: Negative for dysuria, flank pain and hematuria. ; ; Musculoskeletal: Negative for back pain and neck pain. Negative for swelling and trauma.; ; Skin: Negative for pruritus, rash, abrasions, blisters, bruising and skin lesion.; ; Neuro: Negative for headache, lightheadedness and neck stiffness. Negative for weakness, altered level of consciousness , altered mental status, extremity weakness, paresthesias, involuntary movement, seizure and syncope.      Allergies  Morphine and related; Prochlorperazine edisylate; and Compazine  Home Medications   Prior to Admission medications   Medication Sig Start Date End Date Taking? Authorizing Provider  ALPRAZolam Duanne Moron) 0.5 MG tablet Take one tablet by mouth three times daily as needed for anxiety 07/15/14  Yes Tiffany L Reed, DO  aspirin EC 325 MG EC tablet Take 1 tablet (325 mg total) by mouth every morning. 07/14/14  Yes Nita Sells, MD  budesonide-formoterol (SYMBICORT) 160-4.5 MCG/ACT inhaler Inhale 2 puffs into the lungs at bedtime.    Yes Historical Provider, MD  ESTRACE VAGINAL 0.1 MG/GM vaginal cream Place 1 Applicatorful vaginally every other day. At  bedtime 08/11/13  Yes Historical Provider, MD  furosemide (LASIX) 20 MG tablet Take 20 mg by mouth every morning.   Yes Historical Provider, MD  insulin aspart (NOVOLOG FLEXPEN) 100 UNIT/ML FlexPen Inject 2-10 Units into the skin 3 (three) times daily with meals. Sliding Scale: 200-250= 2 units 251-300= 4 units 301-350= 6 units 351-400= 8 units If levels are over 400, GIVE 10 UNITS AND CALL MD   Yes Historical Provider, MD  insulin detemir (LEVEMIR) 100 UNIT/ML injection Inject 60 Units into the skin at bedtime.    Yes Historical Provider, MD  lactulose, encephalopathy, (GENERLAC) 10 GM/15ML SOLN Take 45 mLs (30 g total) by mouth 3 (three) times daily. 06/28/14  Yes  Kathie Dike, MD  Multiple Vitamins-Minerals (CENTRUM SILVER PO) Take 1 tablet by mouth daily.    Yes Historical Provider, MD  omeprazole (PRILOSEC) 20 MG capsule Take 20 mg by mouth daily.   Yes Historical Provider, MD  OxyCODONE (OXYCONTIN) 10 mg T12A 12 hr tablet Take 1 tablet (10 mg total) by mouth 2 (two) times daily as needed (severe pain). 07/14/14  Yes Nita Sells, MD  pregabalin (LYRICA) 100 MG capsule Take one capsule by mouth once daily for pains 07/15/14  Yes Tiffany L Reed, DO  quinapril (ACCUPRIL) 5 MG tablet Take 5 mg by mouth at bedtime.   Yes Historical Provider, MD  rifaximin (XIFAXAN) 550 MG TABS tablet Take 1 tablet (550 mg total) by mouth 2 (two) times daily. 06/28/14  Yes Kathie Dike,  MD  spironolactone (ALDACTONE) 50 MG tablet Take 50 mg by mouth every morning.   Yes Historical Provider, MD  meclizine (ANTIVERT) 25 MG tablet Take 25 mg by mouth 3 (three) times daily as needed for dizziness or nausea.     Historical Provider, MD  triamcinolone cream (KENALOG) 0.1 % Apply 1 application topically daily as needed (for irritation).  06/26/13   Historical Provider, MD   BP 142/69  Pulse 90  Temp(Src) 99.1 F (37.3 C) (Oral)  Resp 18  Ht 5\' 8"  (1.727 m)  Wt 228 lb (103.42 kg)  BMI 34.68 kg/m2  SpO2 98% Physical Exam 2315: Physical examination:  Nursing notes reviewed; Vital signs and O2 SAT reviewed;  Constitutional: Well developed, Well nourished, Well hydrated, Uncomfortable appearing.; Head:  Normocephalic, atraumatic; Eyes: EOMI, PERRL, No scleral icterus; ENMT: Mouth and pharynx normal, Mucous membranes moist; Neck: Supple, Full range of motion, No lymphadenopathy; Cardiovascular: Regular rate and rhythm, No gallop; Respiratory: Breath sounds clear & equal bilaterally, No wheezes.  Speaking full sentences with ease, Normal respiratory effort/excursion; Chest: Nontender, Movement normal; Abdomen: Soft, +mid-epigastric and LUQ tender to palp. No rebound or guarding.  Nondistended, Normal bowel sounds; Genitourinary: No CVA tenderness; Extremities: Pulses normal, No tenderness, No edema, No calf edema or asymmetry.; Neuro: AA&Ox3, Major CN grossly intact.  Speech clear. No gross focal motor or sensory deficits in extremities.; Skin: Color normal, Warm, Dry.    ED Course  Procedures     MDM  MDM Reviewed: previous chart, nursing note and vitals Reviewed previous: labs Interpretation: labs   Results for orders placed during the hospital encounter of 07/28/14  CBC WITH DIFFERENTIAL      Result Value Ref Range   WBC 9.0  4.0 - 10.5 K/uL   RBC 5.26 (*) 3.87 - 5.11 MIL/uL   Hemoglobin 13.6  12.0 - 15.0 g/dL   HCT 40.2  36.0 - 46.0 %   MCV 76.4 (*) 78.0 - 100.0 fL   MCH 25.9 (*) 26.0 - 34.0 pg   MCHC 33.8  30.0 - 36.0 g/dL   RDW 14.4  11.5 - 15.5 %   Platelets 151  150 - 400 K/uL   Neutrophils Relative % 72  43 - 77 %   Neutro Abs 6.5  1.7 - 7.7 K/uL   Lymphocytes Relative 19  12 - 46 %   Lymphs Abs 1.7  0.7 - 4.0 K/uL   Monocytes Relative 7  3 - 12 %   Monocytes Absolute 0.7  0.1 - 1.0 K/uL   Eosinophils Relative 2  0 - 5 %   Eosinophils Absolute 0.1  0.0 - 0.7 K/uL   Basophils Relative 0  0 - 1 %   Basophils Absolute 0.0  0.0 - 0.1 K/uL  COMPREHENSIVE METABOLIC PANEL      Result Value Ref Range   Sodium 141  137 - 147 mEq/L   Potassium 3.9  3.7 - 5.3 mEq/L   Chloride 101  96 - 112 mEq/L   CO2 25  19 - 32 mEq/L   Glucose, Bld 136 (*) 70 - 99 mg/dL   BUN 22  6 - 23 mg/dL   Creatinine, Ser 0.95  0.50 - 1.10 mg/dL   Calcium 9.7  8.4 - 10.5 mg/dL   Total Protein 7.0  6.0 - 8.3 g/dL   Albumin 3.8  3.5 - 5.2 g/dL   AST 25  0 - 37 U/L   ALT 14  0 - 35 U/L  Alkaline Phosphatase 99  39 - 117 U/L   Total Bilirubin 1.7 (*) 0.3 - 1.2 mg/dL   GFR calc non Af Amer 59 (*) >90 mL/min   GFR calc Af Amer 68 (*) >90 mL/min   Anion gap 15  5 - 15  LIPASE, BLOOD      Result Value Ref Range   Lipase 1952 (*) 11 - 59 U/L    2315:  Lipase elevated  today, Tbili chronically elevated. Afebrile, VSS; will admit. Dx and testing d/w pt and family.  Questions answered.  Verb understanding, agreeable to admit. T/C to [facility]. Marin Comment, case discussed, including:  HPI, pertinent PM/SHx, VS/PE, dx testing, ED course and treatment:  Agreeable to admit, requests to write temporary orders, obtain medical bed.       Francine Graven, DO 07/31/14 1709

## 2014-07-28 NOTE — H&P (Signed)
[facility] History and Physical  NIKI PAYMENT DJM:[id number] DOB: 08/25/1941    PCP:   Purvis Kilts, MD   Chief Complaint: abdominal pain after eating today.  HPI: [name] is an 73 y.o. female with hx of cholelithiasis, gallstone pancreatitis, non-drinker, NASH with cirrhosis, DM, hyperlipidemia, chronic gastritis (Dr Dudley Major), presents to the ER with epigastric pain and mild nausea with no vomiting.  She denied black or bloody stool, fever, chills, or diarrhea.  Evalatuion in the ER included a lipase of 1952, with normal liver fx tests, and no leukocytosis, with normal Hb.  Her alkaline phosphatase was normal at 99.  Hospitalist was asked to admit her for pancreatitis.    Rewiew of Systems:  Constitutional: Negative for malaise, fever and chills. No significant weight loss or weight gain Eyes: Negative for eye pain, redness and discharge, diplopia, visual changes, or flashes of light. ENMT: Negative for ear pain, hoarseness, nasal congestion, sinus pressure and sore throat. No headaches; tinnitus, drooling, or problem swallowing. Cardiovascular: Negative for chest pain, palpitations, diaphoresis, dyspnea and peripheral edema. ; No orthopnea, PND Respiratory: Negative for cough, hemoptysis, wheezing and stridor. No pleuritic chestpain. Gastrointestinal: Negative diarrhea, constipation, melena, blood in stool, hematemesis, jaundice and rectal bleeding.    Genitourinary: Negative for frequency, dysuria, incontinence,flank pain and hematuria; Musculoskeletal: Negative for back pain and neck pain. Negative for swelling and trauma.;  Skin: . Negative for pruritus, rash, abrasions, bruising and skin lesion.; ulcerations Neuro: Negative for headache, lightheadedness and neck stiffness. Negative for weakness, altered level of consciousness , altered mental status, extremity weakness, burning feet, involuntary movement, seizure and syncope.  Psych: negative for anxiety, depression,  insomnia, tearfulness, panic attacks, hallucinations, paranoia, suicidal or homicidal ideation    Past Medical History  Diagnosis Date  . CAD in native artery   . Thrombocytopenia, unspecified   . Unspecified hereditary and idiopathic peripheral neuropathy   . Unspecified fall   . Other and unspecified hyperlipidemia   . Unspecified essential hypertension   . Obesity, unspecified   . Personal history of neurosis   . Intrinsic asthma, unspecified   . Allergic rhinitis, cause unspecified   . Cirrhosis of liver     NASH, afp on 06/17/12 =3.7, per pt she had hep B vaccines in 1996, hep A in process.   . Colon adenomas 03/08/10    tcs by Dr. Gala Romney  . Hyperplastic polyps of stomach 03/08/10    tcs by Dr. Dudley Major  . Chronic gastritis 03/09/11    egd by Dr. Gala Romney  . History of hemorrhoids 03/08/10    tcs- internal and external  . Diverticula of colon 03/08/10    L side  . Esophagitis, erosive 03/08/10  . GERD (gastroesophageal reflux disease)   . Wears glasses   . Type II or unspecified type diabetes mellitus without mention of complication, not stated as uncontrolled   . Vertigo   . Pancreatitis   . Bilateral leg weakness   . Thrombocytopenia 04/12/2014  . Pancytopenia, acquired 04/12/2014  . Cirrhosis   . Abnormal EKG     hx of left anterior fasicular block on 04-09-13 ekg epic    Past Surgical History  Procedure Laterality Date  . Hysterectomy and btl      s/p  . Tumor excision  2003    rt arm and left foot  . Colonoscopy  03/08/10    Dr. [name]-->ext/int hemorrhoids, anal paipilla, rectal polyps, desc polyps, cecal polyp, left-sided diverticula. suboptiomal prep. next TCS  02/2015. multiple adenomas  . Esophagogastroduodenoscopy  03/08/10    Dr. [name] erosive RE, small hh, antral erosions, small 1cm are of mucosal indentation along gastric body of doubtful significance, cystic nodularity of hypophyarynx, base of tongue, base of epiglottis. benign gastric biopsies  . Tubal ligation     . Esophagogastroduodenoscopy  10/08/2011    Dr. [name] esophageal varices, antral erosion. next egd 09/2013  . Foot surgery  2003    lt foot  . Eye surgery      cataracts bilateral  . Orif humerus fracture Right 02/17/2013    Procedure: OPEN REDUCTION INTERNAL FIXATION (ORIF) RIGHT PROXIMAL HUMERUS FRACTURE;  Surgeon: [name], MD;  Location: [place];  Service: Orthopedics;  Laterality: Right;  . Partial gastrectomy      family denies  . Esophagogastroduodenoscopy N/A 02/17/2014    Dr. Gala Romney: portal gastropathy, no varices. Due for surveillance 2017  . Breast surgery Right few yrs ago    benign area removed  . Abdominal hysterectomy    . Cardiac catheterization  02/25/2007    Dr. [name]:  normal LV systolic function, mild irregularities of LAD    Medications:  HOME MEDS: Prior to Admission medications   Medication Sig Start Date End Date Taking? Authorizing Provider  ALPRAZolam Duanne Moron) 0.5 MG tablet Take one tablet by mouth three times daily as needed for anxiety 07/15/14  Yes Tiffany L Reed, DO  aspirin EC 325 MG EC tablet Take 1 tablet (325 mg total) by mouth every morning. 07/14/14  Yes Nita Sells, MD  budesonide-formoterol (SYMBICORT) 160-4.5 MCG/ACT inhaler Inhale 2 puffs into the lungs at bedtime.    Yes Historical Provider, MD  ESTRACE VAGINAL 0.1 MG/GM vaginal cream Place 1 Applicatorful vaginally every other day. At  bedtime 08/11/13  Yes Historical Provider, MD  furosemide (LASIX) 20 MG tablet Take 20 mg by mouth every morning.   Yes Historical Provider, MD  insulin aspart (NOVOLOG FLEXPEN) 100 UNIT/ML FlexPen Inject 2-10 Units into the skin 3 (three) times daily with meals. Sliding Scale: 200-250= 2 units 251-300= 4 units 301-350= 6 units 351-400= 8 units If levels are over 400, GIVE 10 UNITS AND CALL MD   Yes Historical Provider, MD  insulin detemir (LEVEMIR) 100 UNIT/ML injection Inject 60 Units into the skin at bedtime.    Yes Historical Provider, MD   lactulose, encephalopathy, (GENERLAC) 10 GM/15ML SOLN Take 45 mLs (30 g total) by mouth 3 (three) times daily. 06/28/14  Yes Kathie Dike, MD  Multiple Vitamins-Minerals (CENTRUM SILVER PO) Take 1 tablet by mouth daily.    Yes Historical Provider, MD  omeprazole (PRILOSEC) 20 MG capsule Take 20 mg by mouth daily.   Yes Historical Provider, MD  OxyCODONE (OXYCONTIN) 10 mg T12A 12 hr tablet Take 1 tablet (10 mg total) by mouth 2 (two) times daily as needed (severe pain). 07/14/14  Yes Nita Sells, MD  pregabalin (LYRICA) 100 MG capsule Take one capsule by mouth once daily for pains 07/15/14  Yes Tiffany L Reed, DO  quinapril (ACCUPRIL) 5 MG tablet Take 5 mg by mouth at bedtime.   Yes Historical Provider, MD  rifaximin (XIFAXAN) 550 MG TABS tablet Take 1 tablet (550 mg total) by mouth 2 (two) times daily. 06/28/14  Yes Kathie Dike, MD  spironolactone (ALDACTONE) 50 MG tablet Take 50 mg by mouth every morning.   Yes Historical Provider, MD  meclizine (ANTIVERT) 25 MG tablet Take 25 mg by mouth 3 (three) times daily as needed for dizziness or  nausea.     Historical Provider, MD  triamcinolone cream (KENALOG) 0.1 % Apply 1 application topically daily as needed (for irritation).  06/26/13   Historical Provider, MD     Allergies:  Allergies  Allergen Reactions  . Morphine And Related Itching  . Prochlorperazine Edisylate Other (See Comments)    Causes anxiety/numbness, pt not sure of this allergy  . Compazine [Prochlorperazine Edisylate] Anxiety    Causes anxiety & nervousness    Social History:   reports that she has never smoked. She has never used smokeless tobacco. She reports that she does not drink alcohol or use illicit drugs.  Family History: Family History  Problem Relation Age of Onset  . Coronary artery disease      FH  . Diabetes      FH  . Heart disease Mother   . Colon cancer Neg Hx      Physical Exam: Filed Vitals:   07/28/14 2012 07/28/14 2314  BP: 112/57  142/69  Pulse: 76 90  Temp: 99.1 F (37.3 C)   TempSrc: Oral   Resp: 16 18  Height: 5\' 8"  (1.727 m)   Weight: 103.42 kg (228 lb)   SpO2: 98% 98%   Blood pressure 142/69, pulse 90, temperature 99.1 F (37.3 C), temperature source Oral, resp. rate 18, height 5\' 8"  (1.727 m), weight 103.42 kg (228 lb), SpO2 98.00%.  GEN:  Pleasant  patient lying in the stretcher in no acute distress; cooperative with exam. PSYCH:  alert and oriented x4; does not appear anxious or depressed; affect is appropriate. HEENT: Mucous membranes pink and anicteric; PERRLA; EOM intact; no cervical lymphadenopathy nor thyromegaly or carotid bruit; no JVD; There were no stridor. Neck is very supple. Breasts:: Not examined CHEST WALL: No tenderness CHEST: Normal respiration, clear to auscultation bilaterally.  HEART: Regular rate and rhythm.  There are no murmur, rub, or gallops.   BACK: No kyphosis or scoliosis; no CVA tenderness ABDOMEN: soft, epigastric tenderness.  no masses, no organomegaly, normal abdominal bowel sounds; no pannus; no intertriginous candida. There is no rebound and no distention. Rectal Exam: Not done EXTREMITIES: No bone or joint deformity; age-appropriate arthropathy of the hands and knees; no edema; no ulcerations.  There is no calf tenderness. Genitalia: not examined PULSES: 2+ and symmetric SKIN: Normal hydration no rash or ulceration CNS: Cranial nerves 2-12 grossly intact no focal lateralizing neurologic deficit.  Speech is fluent; uvula elevated with phonation, facial symmetry and tongue midline. DTR are normal bilaterally, cerebella exam is intact, barbinski is negative and strengths are equaled bilaterally.  No sensory loss.   Labs on Admission:  Basic Metabolic Panel:  Recent Labs Lab 07/28/14 2026  NA 141  K 3.9  CL 101  CO2 25  GLUCOSE 136*  BUN 22  CREATININE 0.95  CALCIUM 9.7   Liver Function Tests:  Recent Labs Lab 07/28/14 2026  AST 25  ALT 14  ALKPHOS 99   BILITOT 1.7*  PROT 7.0  ALBUMIN 3.8    Recent Labs Lab 07/28/14 2026  LIPASE 1952*   No results found for this basename: AMMONIA,  in the last 168 hours CBC:  Recent Labs Lab 07/28/14 2026  WBC 9.0  NEUTROABS 6.5  HGB 13.6  HCT 40.2  MCV 76.4*  PLT 151   Cardiac Enzymes: No results found for this basename: CKTOTAL, CKMB, CKMBINDEX, TROPONINI,  in the last 168 hours  CBG:  Recent Labs Lab 07/27/14 1209 07/27/14 1648 07/27/14 2033 07/28/14 0814 07/28/14  [place]   Assessment/Plan Present on Admission:  . Acute pancreatitis . Acute gallstone pancreatitis . DIABETES, TYPE 2 . OBESITY . GERD . HYPERTENSION . Liver cirrhosis secondary to NASH  PLAN:  Will admit her for pancreatitis.  She had hx of gallstones, so it is possible that she has gallstone pancreatitis, though her liver Fx tests did not suggest an obtructive biliary process.  It could just be idiopathy pancreatitis.  She is not a drinker, and is on no medication that generally causes pancreatitis.  Will obtain an MRI of the abdomen, as this will help looking at any masses as well.  WIll consult GI for any further recommendation.  In the interim, will give her bowel rest, IVF, and IV Fentanyl.  For her DM, will check BS, and give SSI.  Her other medical problems were stable.  She will be admitted to hospitalist service.  Thank you for asking me to participate in her care.  Other plans as per orders.  Code Status: FULL Haskel Khan, MD. [facility] Pager [phone] 7pm to 7am.  07/28/2014, 11:49 PM

## 2014-07-28 NOTE — ED Notes (Signed)
Patient states abdominal pain that started today. Patient points to left/mid abd when asked where the pain is. Patient is moaning and c/o nausea. Patient denies vomiting. Per husband. Patient has a history of diverticulitis and gall stones.

## 2014-07-28 NOTE — ED Notes (Signed)
Pt c/o severe abd pain x 2 hours. Pain radiates to back.

## 2014-07-28 NOTE — ED Notes (Signed)
Family at bedside. Reminded patient that urine specimen is needed. Patient states that she does not think that she can go. RN Larkin Ina made aware.

## 2014-07-29 ENCOUNTER — Observation Stay (HOSPITAL_COMMUNITY): Payer: Medicare Other

## 2014-07-29 ENCOUNTER — Encounter (HOSPITAL_COMMUNITY): Payer: Self-pay

## 2014-07-29 DIAGNOSIS — R945 Abnormal results of liver function studies: Secondary | ICD-10-CM

## 2014-07-29 DIAGNOSIS — E1149 Type 2 diabetes mellitus with other diabetic neurological complication: Secondary | ICD-10-CM

## 2014-07-29 DIAGNOSIS — R7989 Other specified abnormal findings of blood chemistry: Secondary | ICD-10-CM

## 2014-07-29 DIAGNOSIS — E1142 Type 2 diabetes mellitus with diabetic polyneuropathy: Secondary | ICD-10-CM

## 2014-07-29 LAB — GLUCOSE, CAPILLARY
GLUCOSE-CAPILLARY: 220 mg/dL — AB (ref 70–99)
Glucose-Capillary: 263 mg/dL — ABNORMAL HIGH (ref 70–99)
Glucose-Capillary: 326 mg/dL — ABNORMAL HIGH (ref 70–99)
Glucose-Capillary: 317 mg/dL — ABNORMAL HIGH (ref 70–99)
Glucose-Capillary: 119 mg/dL — ABNORMAL HIGH (ref 70–99)
Glucose-Capillary: 135 mg/dL — ABNORMAL HIGH (ref 70–99)
Glucose-Capillary: 119 mg/dL — ABNORMAL HIGH (ref 70–99)

## 2014-07-29 LAB — URINALYSIS, ROUTINE W REFLEX MICROSCOPIC
Bilirubin Urine: NEGATIVE
GLUCOSE, UA: NEGATIVE mg/dL
Hgb urine dipstick: NEGATIVE
NITRITE: POSITIVE — AB
PROTEIN: NEGATIVE mg/dL
Specific Gravity, Urine: 1.025 (ref 1.005–1.030)
UROBILINOGEN UA: 0.2 mg/dL (ref 0.0–1.0)
pH: 5.5 (ref 5.0–8.0)

## 2014-07-29 LAB — COMPREHENSIVE METABOLIC PANEL
ALBUMIN: 3.2 g/dL — AB (ref 3.5–5.2)
ALK PHOS: 142 U/L — AB (ref 39–117)
ALT: 101 U/L — AB (ref 0–35)
AST: 204 U/L — ABNORMAL HIGH (ref 0–37)
Anion gap: 11 (ref 5–15)
BUN: 21 mg/dL (ref 6–23)
CO2: 23 mEq/L (ref 19–32)
Calcium: 8.7 mg/dL (ref 8.4–10.5)
Chloride: 103 mEq/L (ref 96–112)
Creatinine, Ser: 0.83 mg/dL (ref 0.50–1.10)
GFR calc Af Amer: 80 mL/min — ABNORMAL LOW (ref >90)
GFR calc non Af Amer: 69 mL/min — ABNORMAL LOW (ref >90)
Glucose, Bld: 315 mg/dL — ABNORMAL HIGH (ref 70–99)
POTASSIUM: 4.5 meq/L (ref 3.7–5.3)
SODIUM: 137 meq/L (ref 137–147)
TOTAL PROTEIN: 6 g/dL (ref 6.0–8.3)
Total Bilirubin: 1.9 mg/dL — ABNORMAL HIGH (ref 0.3–1.2)

## 2014-07-29 LAB — URINE MICROSCOPIC-ADD ON

## 2014-07-29 LAB — TROPONIN I: Troponin I: <0.3 ng/mL (ref <0.30)

## 2014-07-29 LAB — LIPASE, BLOOD: Lipase: 2626 U/L — ABNORMAL HIGH (ref 11–59)

## 2014-07-29 MED ORDER — LISINOPRIL 10 MG PO TABS
10.0000 mg | ORAL_TABLET | Freq: Every day | ORAL | Status: DC
  Administered 2014-07-29 – 2014-07-31 (×3): 10 mg via ORAL
  Filled 2014-07-29 (×3): qty 1

## 2014-07-29 MED ORDER — ALPRAZOLAM 0.5 MG PO TABS: 0.5000 mg | ORAL_TABLET | Freq: Three times a day (TID) | ORAL | Status: DC | PRN

## 2014-07-29 MED ORDER — CIPROFLOXACIN IN D5W 400 MG/200ML IV SOLN
400.0000 mg | Freq: Two times a day (BID) | INTRAVENOUS | Status: DC
  Administered 2014-07-29 – 2014-07-31 (×4): 400 mg via INTRAVENOUS
  Filled 2014-07-29 (×4): qty 200

## 2014-07-29 MED ORDER — ONDANSETRON HCL 4 MG/2ML IJ SOLN: 4.0000 mg | Freq: Three times a day (TID) | INTRAMUSCULAR | Status: AC | PRN

## 2014-07-29 MED ORDER — HEPARIN SODIUM (PORCINE) 5000 UNIT/ML IJ SOLN
5000.0000 [IU] | Freq: Three times a day (TID) | INTRAMUSCULAR | Status: DC
  Administered 2014-07-29 – 2014-07-31 (×8): 5000 [IU] via SUBCUTANEOUS
  Filled 2014-07-29 (×8): qty 1

## 2014-07-29 MED ORDER — SPIRONOLACTONE 25 MG PO TABS
50.0000 mg | ORAL_TABLET | Freq: Every day | ORAL | Status: DC
  Administered 2014-07-30 – 2014-07-31 (×2): 50 mg via ORAL
  Filled 2014-07-29 (×2): qty 2

## 2014-07-29 MED ORDER — ESTRADIOL 0.1 MG/GM VA CREA
1.0000 | TOPICAL_CREAM | VAGINAL | Status: DC
  Administered 2014-07-30: 1 via VAGINAL
  Filled 2014-07-29: qty 42.5

## 2014-07-29 MED ORDER — SODIUM CHLORIDE 0.9 % IV SOLN
INTRAVENOUS | Status: AC
  Filled 2014-07-29: qty 150

## 2014-07-29 MED ORDER — SODIUM CHLORIDE 0.9 % IJ SOLN
INTRAMUSCULAR | Status: AC
  Filled 2014-07-29: qty 15

## 2014-07-29 MED ORDER — INSULIN GLARGINE 100 UNIT/ML ~~LOC~~ SOLN
15.0000 [IU] | Freq: Once | SUBCUTANEOUS | Status: AC
  Administered 2014-07-29: 15 [IU] via SUBCUTANEOUS
  Filled 2014-07-29: qty 0.15

## 2014-07-29 MED ORDER — PANTOPRAZOLE SODIUM 40 MG PO TBEC
40.0000 mg | DELAYED_RELEASE_TABLET | Freq: Every day | ORAL | Status: DC
  Administered 2014-07-29: 40 mg via ORAL
  Filled 2014-07-29: qty 1

## 2014-07-29 MED ORDER — SODIUM CHLORIDE 0.9 % IV SOLN: INTRAVENOUS | Status: DC

## 2014-07-29 MED ORDER — ASPIRIN EC 325 MG PO TBEC
325.0000 mg | DELAYED_RELEASE_TABLET | Freq: Every morning | ORAL | Status: DC
  Administered 2014-07-29 – 2014-07-31 (×3): 325 mg via ORAL
  Filled 2014-07-29 (×3): qty 1

## 2014-07-29 MED ORDER — RIFAXIMIN 200 MG PO TABS
200.0000 mg | ORAL_TABLET | Freq: Three times a day (TID) | ORAL | Status: DC
  Administered 2014-07-30 – 2014-07-31 (×5): 200 mg via ORAL
  Filled 2014-07-29 (×10): qty 1

## 2014-07-29 MED ORDER — HYDROMORPHONE HCL 1 MG/ML IJ SOLN
0.5000 mg | INTRAMUSCULAR | Status: DC | PRN
  Administered 2014-07-29 – 2014-07-31 (×8): 1 mg via INTRAVENOUS
  Filled 2014-07-29 (×8): qty 1

## 2014-07-29 MED ORDER — INSULIN ASPART 100 UNIT/ML ~~LOC~~ SOLN
0.0000 [IU] | SUBCUTANEOUS | Status: DC
  Administered 2014-07-29: 5 [IU] via SUBCUTANEOUS
  Administered 2014-07-29: 2 [IU] via SUBCUTANEOUS
  Administered 2014-07-29: 11 [IU] via SUBCUTANEOUS
  Administered 2014-07-30: 5 [IU] via SUBCUTANEOUS
  Administered 2014-07-30 (×2): 3 [IU] via SUBCUTANEOUS
  Administered 2014-07-30: 5 [IU] via SUBCUTANEOUS
  Administered 2014-07-31 (×2): 3 [IU] via SUBCUTANEOUS
  Administered 2014-07-31: 8 [IU] via SUBCUTANEOUS
  Administered 2014-07-31: 3 [IU] via SUBCUTANEOUS

## 2014-07-29 MED ORDER — DIAZEPAM 5 MG PO TABS
5.0000 mg | ORAL_TABLET | ORAL | Status: AC
  Administered 2014-07-29: 5 mg via ORAL
  Filled 2014-07-29: qty 1

## 2014-07-29 MED ORDER — GADOBENATE DIMEGLUMINE 529 MG/ML IV SOLN
20.0000 mL | Freq: Once | INTRAVENOUS | Status: AC | PRN
  Administered 2014-07-29: 20 mL via INTRAVENOUS

## 2014-07-29 MED ORDER — LACTULOSE 10 GM/15ML PO SOLN
30.0000 g | Freq: Two times a day (BID) | ORAL | Status: DC
  Administered 2014-07-30 – 2014-07-31 (×3): 30 g via ORAL
  Filled 2014-07-29 (×3): qty 60

## 2014-07-29 MED ORDER — INSULIN GLARGINE 100 UNIT/ML ~~LOC~~ SOLN
10.0000 [IU] | Freq: Every day | SUBCUTANEOUS | Status: DC
  Administered 2014-07-29: 10 [IU] via SUBCUTANEOUS
  Filled 2014-07-29 (×2): qty 0.1

## 2014-07-29 MED ORDER — PANTOPRAZOLE SODIUM 40 MG IV SOLR
40.0000 mg | INTRAVENOUS | Status: DC
  Administered 2014-07-29: 40 mg via INTRAVENOUS
  Filled 2014-07-29: qty 40

## 2014-07-29 MED ORDER — MOMETASONE FURO-FORMOTEROL FUM 200-5 MCG/ACT IN AERO
2.0000 | INHALATION_SPRAY | Freq: Every day | RESPIRATORY_TRACT | Status: DC
  Administered 2014-07-29 – 2014-07-30 (×2): 2 via RESPIRATORY_TRACT
  Filled 2014-07-29 (×10): qty 8.8

## 2014-07-29 MED ORDER — ESTRADIOL 0.1 MG/GM VA CREA
1.0000 | TOPICAL_CREAM | VAGINAL | Status: DC
  Filled 2014-07-29: qty 42.5

## 2014-07-29 MED ORDER — PREGABALIN 50 MG PO CAPS
100.0000 mg | ORAL_CAPSULE | Freq: Every day | ORAL | Status: DC
  Administered 2014-07-29 – 2014-07-31 (×3): 100 mg via ORAL
  Filled 2014-07-29 (×3): qty 2

## 2014-07-29 NOTE — Clinical Social Work Psychosocial (Signed)
Clinical Social Work Department BRIEF PSYCHOSOCIAL ASSESSMENT 07/29/2014  Patient:  [name] [name], [name]     Account Number:  [id number]     Admit date:  07/28/2014  Clinical Social Worker:  Wyatt Haste  Date/Time:  07/29/2014 11:35 AM  Referred by:  CSW  Date Referred:  07/29/2014 Referred for  SNF Placement   Other Referral:   Interview type:  Patient Other interview type:   daughter- [name]    PSYCHOSOCIAL DATA Living Status:  FACILITY Admitted from facility:  [facility] Level of care:  [place] Primary support name:  [name] [name] Primary support relationship to patient:  SPOUSE Degree of support available:   supportive    CURRENT CONCERNS Current Concerns  Post-Acute Placement   Other Concerns:    SOCIAL WORK ASSESSMENT / PLAN CSW met with pt and pt's daughter [name] [name] at bedside. Pt alert and oriented and known to CSW from previous admission. She has been a resident at [name] for the last few weeks for rehab. Pt has very supportive family. She indicates that they had discussed at [name] about pt returning home next week. Pt said she has been ambulating with a walker and doing very well with PT. [name] indicates they will discuss with pt's husband to determine what d/c plan will be from [name]. Pt is willing to return to [name] to complete rehab if needed. She is unsure at this point as she does not know what [name] course will be. Per [name] at [name], pt is okay to return if needed and no FL2 is necessary.   Assessment/plan status:  Psychosocial Support/Ongoing Assessment of Needs Other assessment/ plan:   Information/referral to community resources:   [name]    PATIENT'S/FAMILY'S RESPONSE TO PLAN OF CARE: Pt was very pleased with plan to return home next week. She is hopeful that [name] admission will not delay this plan. CSW will continue to follow and keep [name] updated with pt's permission.       [name], [place]

## 2014-07-29 NOTE — Progress Notes (Signed)
[facility] PROGRESS NOTE  [name] [name] XTG:[id number] DOB: 1941/02/16 DOA: 07/28/2014 PCP: Purvis Kilts, MD    Code Status: Full code Family Communication: Discussed with daughter, Ms. [place] Disposition Plan: Discharge when clinically appropriate   Consultants:  General surgery pending for tomorrow  Procedures:  MRCP  Antibiotics:  Starting Cipro 9/16  HPI/Subjective: The patient continues to have moderate epigastric pain. She denies vomiting, but does have intermittent nausea. She denies pain with urination, but has malodorous urine.  Objective: Filed Vitals:   08-12-14 0540  BP: 122/54  Pulse: 80  Temp: 98.2 F (36.8 C)  Resp: 18   oxygen saturation 98% on room air.  Intake/Output Summary (Last 24 hours) at Aug 12, 2014 1321 Last data filed at 08-12-2014 1100  Gross per 24 hour  Intake      0 ml  Output    800 ml  Net   -800 ml   Filed Weights   07/28/14 2012 Aug 12, 2014 0043  Weight: 103.42 kg (228 lb) 104.4 kg (230 lb 2.6 oz)    Exam:   General: Obese 73 year old Caucasian woman laying in bed, in no acute distress.  Cardiovascular: S1, S2, no murmurs rubs or gallops.  Respiratory: Decreased breath sounds in the bases, but clear anteriorly.  Abdomen: Obese, positive bowel sounds, soft, moderately tender in the epigastrium and mildly to moderately tender in the right upper quadrant. No distention or masses palpated.  Musculoskeletal: No acute hot red joints. Pedal pulses palpable. No pedal edema.  Neurologic: She is alert and oriented x2. Cranial nerves II through XII are grossly intact.   Data Reviewed: Basic Metabolic Panel:  Recent Labs Lab 07/28/14 2026 08/12/14 0558  NA 141 137  K 3.9 4.5  CL 101 103  CO2 25 23  GLUCOSE 136* 315*  BUN 22 21  CREATININE 0.95 0.83  CALCIUM 9.7 8.7   Liver Function Tests:  Recent Labs Lab 07/28/14 2026 2014/08/12 0558  AST 25 204*  ALT 14 101*  ALKPHOS 99 142*  BILITOT 1.7* 1.9*  PROT  7.0 6.0  ALBUMIN 3.8 3.2*    Recent Labs Lab 07/28/14 2026 08/12/14 0558  LIPASE 1952* 2626*   No results found for this basename: AMMONIA,  in the last 168 hours CBC:  Recent Labs Lab 07/28/14 2026  WBC 9.0  NEUTROABS 6.5  HGB 13.6  HCT 40.2  MCV 76.4*  PLT 151   Cardiac Enzymes:  Recent Labs Lab 07/28/14 2340  TROPONINI <0.30   BNP (last 3 results) No results found for this basename: PROBNP,  in the last 8760 hours CBG:  Recent Labs Lab 07/28/14 1529 08-12-14 0257 08-12-14 0538 August 12, 2014 0805 08-12-14 1135  GLUCAP 263* 326* 317* 220* 119*    No results found for this or any previous visit (from the past 240 hour(s)).   Studies: Mr 3d Recon At Scanner  2014-08-12   CLINICAL DATA:  Cirrhosis and portal hypertension. Reported diagnosis of pancreatitis  EXAM: MRI ABDOMEN WITHOUT AND WITH CONTRAST (INCLUDING MRCP)  TECHNIQUE: Multiplanar multisequence MR imaging of the abdomen was performed both before and after the administration of intravenous contrast. Heavily T2-weighted images of the biliary and pancreatic ducts were obtained, and three-dimensional MRCP images were rendered by post processing.  CONTRAST:  30mL MULTIHANCE GADOBENATE DIMEGLUMINE 529 MG/ML IV SOLN  COMPARISON:  The most recent similar comparison exam is abdominal ultrasound 11/19/2013 and CT abdomen/ pelvis 04/26/2013. MRI abdomen 06/29/2011.  FINDINGS: Multiple series are degraded by patient motion. There is mild  to moderate diffuse pancreatic edema and trace peripancreatic fluid. Nodular hepatic contour and splenomegaly reidentified compatible with previously diagnosed cirrhosis and evidence of portal hypertension. Trace pericholecystic fluid and or gallbladder wall thickening reidentified with dependent tiny stones noted. 1.3 cm cyst at the dome of the right hepatic lobe reidentified as well as T2 hyperintense hepatic lesions measuring less than 5 mm statistically most likely cysts or hamartomas.   Bilateral T2 hyperintense renal cortical cysts are reidentified. 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 Adrenal glands are normal. No focal splenic lesion. No intrahepatic, common duct, or pancreatic ductal dilatation. No lymphadenopathy. Visualized bowel is unremarkable.  After administration of contrast, visualization is again suboptimal on multiple series because of patient respiratory motion. Allowing for this, no abnormal enhancement is seen. There is inhomogeneous hypoenhancement of the pancreas.  On heavily T2 weighted sequences, No common duct filling defect, focal cut off, or other abnormality is identified.  IMPRESSION: Findings compatible with mild-to-moderate pancreatitis without complicating features such as peripancreatic fluid collection to suggest pseudocyst formation.  No common duct filling defect or contour abnormality.  Gallstones with mild gallbladder wall thickening and/or pericholecystic fluid compatible with hypoproteinemia/changes of cirrhosis and portal hypertension. Coincident cholecystitis would be significantly less likely.   Electronically Signed   By: Conchita Paris M.D.   On: 07/29/2014 13:14   Dg Chest Portable 1 View  07/29/2014   CLINICAL DATA:  Acute onset of chest pain.  EXAM: PORTABLE CHEST - 1 VIEW  COMPARISON:  Chest radiograph performed 07/12/2014  FINDINGS: The lungs are well-aerated. Vascular congestion is noted, without significant pulmonary edema. There is no evidence of focal opacification, pleural effusion or pneumothorax.  The cardiomediastinal silhouette is borderline enlarged. No acute osseous abnormalities are seen. Hardware is partially imaged at the right humeral head.  IMPRESSION: Vascular congestion and borderline cardiomegaly, without significant pulmonary edema.   Electronically Signed   By: Garald Balding M.D.   On: 07/29/2014 00:23   Mr Abd W/wo Cm/mrcp  07/29/2014   CLINICAL DATA:  Cirrhosis and portal hypertension. Reported  diagnosis of pancreatitis  EXAM: MRI ABDOMEN WITHOUT AND WITH CONTRAST (INCLUDING MRCP)  TECHNIQUE: Multiplanar multisequence MR imaging of the abdomen was performed both before and after the administration of intravenous contrast. Heavily T2-weighted images of the biliary and pancreatic ducts were obtained, and three-dimensional MRCP images were rendered by post processing.  CONTRAST:  57mL MULTIHANCE GADOBENATE DIMEGLUMINE 529 MG/ML IV SOLN  COMPARISON:  The most recent similar comparison exam is abdominal ultrasound 11/19/2013 and CT abdomen/ pelvis 04/26/2013. MRI abdomen 06/29/2011.  FINDINGS: Multiple series are degraded by patient motion. There is mild to moderate diffuse pancreatic edema and trace peripancreatic fluid. Nodular hepatic contour and splenomegaly reidentified compatible with previously diagnosed cirrhosis and evidence of portal hypertension. Trace pericholecystic fluid and or gallbladder wall thickening reidentified with dependent tiny stones noted. 1.3 cm cyst at the dome of the right hepatic lobe reidentified as well as T2 hyperintense hepatic lesions measuring less than 5 mm statistically most likely cysts or hamartomas.  Bilateral T2 hyperintense renal cortical cysts are reidentified. 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 Adrenal glands are normal. No focal splenic lesion. No intrahepatic, common duct, or pancreatic ductal dilatation. No lymphadenopathy. Visualized bowel is unremarkable.  After administration of contrast, visualization is again suboptimal on multiple series because of patient respiratory motion. Allowing for this, no abnormal enhancement is seen. There is inhomogeneous hypoenhancement of the pancreas.  On heavily T2 weighted sequences, No common  duct filling defect, focal cut off, or other abnormality is identified.  IMPRESSION: Findings compatible with mild-to-moderate pancreatitis without complicating features such as peripancreatic fluid collection  to suggest pseudocyst formation.  No common duct filling defect or contour abnormality.  Gallstones with mild gallbladder wall thickening and/or pericholecystic fluid compatible with hypoproteinemia/changes of cirrhosis and portal hypertension. Coincident cholecystitis would be significantly less likely.   Electronically Signed   By: Conchita Paris M.D.   On: 07/29/2014 13:14    Scheduled Meds: . aspirin EC  325 mg Oral q morning - 10a  . [START ON 07/30/2014] estradiol  1 Applicatorful Vaginal G18E  . heparin  5,000 Units Subcutaneous 3 times per day  . insulin aspart  0-15 Units Subcutaneous 6 times per day  . insulin glargine  10 Units Subcutaneous QHS  . lisinopril  10 mg Oral Daily  . mometasone-formoterol  2 puff Inhalation QHS  . pantoprazole  40 mg Oral Daily  . pregabalin  100 mg Oral Daily  . sodium chloride      . sodium chloride       Continuous Infusions: . sodium chloride 75 mL/hr at 07/29/14 9937   Assessment and plan: Principal Problem:   Acute gallstone pancreatitis Active Problems:   Elevated LFTs   Uncontrolled type 2 diabetes mellitus with peripheral neuropathy   Liver cirrhosis secondary to NASH   UTI (urinary tract infection)   OBESITY   HYPERTENSION   CAD, NATIVE VESSEL   GERD   ANXIETY DISORDER, HX OF   1. Recurrent pancreatitis, presumed to be secondary to acute gallstone pancreatitis.  The patient was hospitalized in June 2015 for the same. There was to be scheduled for an outpatient EUS by GI, but apparently, the patient canceled the procedure because of death in her family. The plan was to also refer her to Dr. Arnoldo Morale for evaluation of an elective cholecystectomy following the EUS, but this was not done. Her lipase during this admission is much more elevated than it was in June. MRI/MRCP of the abdomen was ordered and it revealed findings compatible with mild-to-moderate pancreatitis without complicating features; no common duct filling defect or  abnormality; gallstones with mild gallbladder wall thickening and/or pericholecystic fluid (compatible with hypoproteinemia/changes of cirrhosis and portal hypertension,): coincident cholecystitis less likely per radiology. We'll consult general surgery. Continue bowel rest, IV fluid hydration, and pain management. We'll change Protonix to IV.  Elevated LFTs/nonspecific hepatitis. The patient's liver transaminases were within normal limits on admission, but has increased since then. We will continue to follow the trend.  Hepatic cirrhosis secondary to [place]. She is followed by [facility]. We will consult them while she is here in light of the recurrent pancreatitis. Spironolactone, Lasix, lactulose and rifaximin are on hold hold, but will consider restarting them tomorrow morning if tolerated.  Hypertension. Currently stable on ACE inhibitor.  Type 2 diabetes mellitus with peripheral neuropathy. Her capillary blood glucose was elevated, but now is improving. Continue sliding scale NovoLog conservatively as she is n.p.o. She was given Lantus earlier and scheduled for Lantus each bedtime, but this will be discontinued to avoid symptomatic hypoglycemia.  Abnormal urinalysis, treating as a urinary tract infection. She has a history of chronic recurrent urinary tract infections. Her urine is malodorous, but she denies dysuria. In the setting of acute pancreatitis, we'll start Cipro empirically. We'll culture the urine.    Time spent: 35 minutes    [facility] Pager [phone]. If 7PM-7AM, please contact night-coverage at  [email or web address], password [facility] 07/29/2014, 1:21 PM  LOS: 1 day

## 2014-07-29 NOTE — Consult Note (Signed)
Referring Provider: Rexene Alberts, MD Primary Care Physician:  Purvis Kilts, MD Primary Gastroenterologist:  Garfield Cornea, MD   Reason for Consultation:  Biliary pancreatitis  HPI: [name] [name] is a 73 y.o. female is a 73 year old female with history of [name] [name] cirrhosis, hepatic encephalopathy (recent hospitalization 06/2014), recurrent pancreatitis presented from [facility] center with complaints of abdominal pain radiating into her back.   Symptoms started acutely yesterday. We last saw the patient in June 2015 with similar symptoms. She had an episode in June 2014 as well which was hospitalized. Previously she has declined for endoscopic ultrasound to rule out occult pancreatic lesions/biliary issues. Gallbladder remains in situ.   Her last EGD was in April 2015 by Dr. Gala Romney with portal gastropathy, no varices. Due for surveillance April 2017. Next colonoscopy due April 2016.  On admission her lipase was 1952, total bilirubin 1.7, AST 25, ALT 14, alkaline phosphatase 99, albumin 3.8. Today her total bilirubin is 1.9, alkaline phosphatase 142, AST 204, ALT 101. Her lipase is 2626.    Prior to Admission medications   Medication Sig Start Date End Date Taking? Authorizing Provider  ALPRAZolam Duanne Moron) 0.5 MG tablet Take one tablet by mouth three times daily as needed for anxiety 07/15/14  Yes Tiffany L Reed, DO  aspirin EC 325 MG EC tablet Take 1 tablet (325 mg total) by mouth every morning. 07/14/14  Yes Nita Sells, MD  budesonide-formoterol (SYMBICORT) 160-4.5 MCG/ACT inhaler Inhale 2 puffs into the lungs at bedtime.    Yes Historical Provider, MD  ESTRACE VAGINAL 0.1 MG/GM vaginal cream Place 1 Applicatorful vaginally every other day. At  bedtime 08/11/13  Yes Historical Provider, MD  furosemide (LASIX) 20 MG tablet Take 20 mg by mouth every morning.   Yes Historical Provider, MD  insulin aspart (NOVOLOG FLEXPEN) 100 UNIT/ML FlexPen Inject 2-10 Units into the skin 3 (three) times daily  with meals. Sliding Scale: 200-250= 2 units 251-300= 4 units 301-350= 6 units 351-400= 8 units If levels are over 400, GIVE 10 UNITS AND CALL MD   Yes Historical Provider, MD  insulin detemir (LEVEMIR) 100 UNIT/ML injection Inject 60 Units into the skin at bedtime.    Yes Historical Provider, MD  lactulose, encephalopathy, (GENERLAC) 10 GM/15ML SOLN Take 45 mLs (30 g total) by mouth 3 (three) times daily. 06/28/14  Yes Kathie Dike, MD  Multiple Vitamins-Minerals (CENTRUM SILVER PO) Take 1 tablet by mouth daily.    Yes Historical Provider, MD  omeprazole (PRILOSEC) 20 MG capsule Take 20 mg by mouth daily.   Yes Historical Provider, MD  OxyCODONE (OXYCONTIN) 10 mg T12A 12 hr tablet Take 1 tablet (10 mg total) by mouth 2 (two) times daily as needed (severe pain). 07/14/14  Yes Nita Sells, MD  pregabalin (LYRICA) 100 MG capsule Take one capsule by mouth once daily for pains 07/15/14  Yes Tiffany L Reed, DO  quinapril (ACCUPRIL) 5 MG tablet Take 5 mg by mouth at bedtime.   Yes Historical Provider, MD  rifaximin (XIFAXAN) 550 MG TABS tablet Take 1 tablet (550 mg total) by mouth 2 (two) times daily. 06/28/14  Yes Kathie Dike, MD  spironolactone (ALDACTONE) 50 MG tablet Take 50 mg by mouth every morning.   Yes Historical Provider, MD  meclizine (ANTIVERT) 25 MG tablet Take 25 mg by mouth 3 (three) times daily as needed for dizziness or nausea.     Historical Provider, MD  triamcinolone cream (KENALOG) 0.1 % Apply 1 application topically daily as needed (for irritation).  06/26/13   Historical Provider, MD    Current Facility-Administered Medications  Medication Dose Route Frequency Provider Last Rate Last Dose  . 0.9 %  sodium chloride infusion   Intravenous Continuous Francine Graven, DO 75 mL/hr at 07/29/14 [phone]    . ALPRAZolam Duanne Moron) tablet 0.5 mg  0.5 mg Oral TID PRN Orvan Falconer, MD      . aspirin EC tablet 325 mg  325 mg Oral q morning - 10a Orvan Falconer, MD      . Derrill Memo ON 07/30/2014]  estradiol (ESTRACE) vaginal cream 1 Applicatorful  1 Applicatorful Vaginal M09O Rexene Alberts, MD      . fentaNYL (SUBLIMAZE) injection 12.5 mcg  12.5 mcg Intravenous Q30 min PRN Francine Graven, DO   12.5 mcg at 07/28/14 2332  . heparin injection 5,000 Units  5,000 Units Subcutaneous 3 times per day Orvan Falconer, MD   5,000 Units at 07/29/14 [phone]  . insulin aspart (novoLOG) injection 0-15 Units  0-15 Units Subcutaneous 6 times per day Orvan Falconer, MD   5 Units at 07/29/14 0800  . insulin glargine (LANTUS) injection 10 Units  10 Units Subcutaneous QHS Orvan Falconer, MD   10 Units at 07/29/14 0316  . insulin glargine (LANTUS) injection 15 Units  15 Units Subcutaneous Once Rexene Alberts, MD      . lisinopril (PRINIVIL,ZESTRIL) tablet 10 mg  10 mg Oral Daily Orvan Falconer, MD      . mometasone-formoterol [facility]) 200-5 MCG/ACT inhaler 2 puff  2 puff Inhalation QHS Orvan Falconer, MD      . ondansetron [facility]) injection 4 mg  4 mg Intravenous Q8H PRN Francine Graven, DO      . pantoprazole (PROTONIX) EC tablet 40 mg  40 mg Oral Daily Orvan Falconer, MD      . pregabalin (LYRICA) capsule 100 mg  100 mg Oral Daily Orvan Falconer, MD      . sodium chloride 0.9 % infusion           . sodium chloride 0.9 % injection             Allergies as of 07/28/2014 - Review Complete 07/28/2014  Allergen Reaction Noted  . Morphine and related Itching 04/26/2013  . Prochlorperazine edisylate Other (See Comments)   . Compazine [prochlorperazine edisylate] Anxiety 06/04/2012    Past Medical History  Diagnosis Date  . CAD in native artery   . Thrombocytopenia, unspecified   . Unspecified hereditary and idiopathic peripheral neuropathy   . Unspecified fall   . Other and unspecified hyperlipidemia   . Unspecified essential hypertension   . Obesity, unspecified   . Personal history of neurosis   . Intrinsic asthma, unspecified   . Allergic rhinitis, cause unspecified   . Cirrhosis of liver     NASH, afp on 06/17/12 =3.7, per pt she had hep B  vaccines in 1996, hep A in process.   . Colon adenomas 03/08/10    tcs by Dr. Gala Romney  . Hyperplastic polyps of stomach 03/08/10    tcs by Dr. Dudley Major  . Chronic gastritis 03/09/11    egd by Dr. Gala Romney  . History of hemorrhoids 03/08/10    tcs- internal and external  . Diverticula of colon 03/08/10    L side  . Esophagitis, erosive 03/08/10  . GERD (gastroesophageal reflux disease)   . Wears glasses   . Type II or unspecified type diabetes mellitus without mention of complication, not stated as uncontrolled   . Vertigo   .  Pancreatitis   . Bilateral leg weakness   . Thrombocytopenia 04/12/2014  . Pancytopenia, acquired 04/12/2014  . Cirrhosis   . Abnormal EKG     hx of left anterior fasicular block on 04-09-13 ekg epic    Past Surgical History  Procedure Laterality Date  . Hysterectomy and btl      s/p  . Tumor excision  2003    rt arm and left foot  . Colonoscopy  03/08/10    Dr. Mayford Alberg-->ext/int hemorrhoids, anal paipilla, rectal polyps, desc polyps, cecal polyp, left-sided diverticula. suboptiomal prep. next TCS 02/2015. multiple adenomas  . Esophagogastroduodenoscopy  03/08/10    Dr. [name] erosive RE, small hh, antral erosions, small 1cm are of mucosal indentation along gastric body of doubtful significance, cystic nodularity of hypophyarynx, base of tongue, base of epiglottis. benign gastric biopsies  . Tubal ligation    . Esophagogastroduodenoscopy  10/08/2011    Dr. [name] esophageal varices, antral erosion. next egd 09/2013  . Foot surgery  2003    lt foot  . Eye surgery      cataracts bilateral  . Orif humerus fracture Right 02/17/2013    Procedure: OPEN REDUCTION INTERNAL FIXATION (ORIF) RIGHT PROXIMAL HUMERUS FRACTURE;  Surgeon: [name], MD;  Location: [facility];  Service: Orthopedics;  Laterality: Right;  . Partial gastrectomy      family denies  . Esophagogastroduodenoscopy N/A 02/17/2014    Dr. Gala Romney: portal gastropathy, no varices. Due for surveillance 2017  .  Breast surgery Right few yrs ago    benign area removed  . Abdominal hysterectomy    . Cardiac catheterization  02/25/2007    Dr. [name]:  normal LV systolic function, mild irregularities of LAD    Family History  Problem Relation Age of Onset  . Coronary artery disease      FH  . Diabetes      FH  . Heart disease Mother   . Colon cancer Neg Hx     History   Social History  . Marital Status: Married    Spouse Name: N/A    Number of Children: 3  . Years of Education: N/A   Occupational History  . Not on file.   Social History Main Topics  . Smoking status: Never Smoker   . Smokeless tobacco: Never Used  . Alcohol Use: No  . Drug Use: No  . Sexual Activity: Not Currently   Other Topics Concern  . Not on file   Social History Narrative  . No narrative on file     ROS:  General: Negative for anorexia, weight loss, fever, chills, fatigue. Positive weakness. Eyes: Negative for vision changes.  ENT: Negative for hoarseness, difficulty swallowing , nasal congestion. CV: Negative for chest pain, angina, palpitations, dyspnea on exertion, peripheral edema.  Respiratory: Negative for dyspnea at rest, dyspnea on exertion, cough, sputum, wheezing.  GI: See history of present illness. GU:  Negative for dysuria, hematuria, urinary incontinence, urinary frequency, nocturnal urination.  MS: Negative for joint pain. Positive mid back pain.  Derm: Negative for rash or itching.  Neuro: Negative for weakness, abnormal sensation, seizure, frequent headaches, memory loss, confusion.  Psych: Negative for anxiety, depression, suicidal ideation, hallucinations.  Endo: Negative for unusual weight change.  Heme: Negative for bruising or bleeding. Allergy: Negative for rash or hives.       Physical Examination: Vital signs in last 24 hours: Temp:  [98.2 F (36.8 C)-99.1 F (37.3 C)] 98.2 F (36.8 C) (09/17 0540) Pulse  Rate:  [76-91] 80 (09/17 0540) Resp:  [16-18] 18 (09/17  0540) BP: (112-142)/(51-69) 122/54 mmHg (09/17 0540) SpO2:  [98 %] 98 % (09/17 0540) Weight:  [228 lb (103.42 kg)-230 lb 2.6 oz (104.4 kg)] 230 lb 2.6 oz (104.4 kg) (09/17 0043)    General: Well-nourished, well-developed in no acute distress. Daughter at bedside. Head: Normocephalic, atraumatic.   Eyes: Conjunctiva pink, no icterus. Mouth: Oropharyngeal mucosa moist and pink , no lesions erythema or exudate. Neck: Supple without thyromegaly, masses, or lymphadenopathy.  Lungs: Clear to auscultation bilaterally.  Heart: Regular rate and rhythm, no murmurs rubs or gallops.  Abdomen: Bowel sounds are normal, moderate epigastric tenderness, nondistended, no hepatosplenomegaly or masses, no abdominal bruits or    hernia , no rebound or guarding.   Rectal: not performed. Extremities: No lower extremity edema, clubbing, deformity.  Neuro: Alert and oriented x 4 , grossly normal neurologically.  Skin: Warm and dry, no rash or jaundice.   Psych: Alert and cooperative, normal mood and affect.        Intake/Output from previous day:   Intake/Output this shift:    Lab Results: CBC  Recent Labs  07/28/14 2026  WBC 9.0  HGB 13.6  HCT 40.2  MCV 76.4*  PLT 151   BMET  Recent Labs  07/28/14 2026 07/29/14 0558  NA 141 137  K 3.9 4.5  CL 101 103  CO2 25 23  GLUCOSE 136* 315*  BUN 22 21  CREATININE 0.95 0.83  CALCIUM 9.7 8.7   LFT  Recent Labs  07/28/14 2026 07/29/14 0558  BILITOT 1.7* 1.9*  ALKPHOS 99 142*  AST 25 204*  ALT 14 101*  PROT 7.0 6.0  ALBUMIN 3.8 3.2*    Lipase  Recent Labs  07/28/14 2026 07/29/14 0558  LIPASE 1952* 2626*    PT/INR No results found for this basename: LABPROT, INR,  in the last 72 hours    Imaging Studies: Dg Chest 2 View  07/12/2014   CLINICAL DATA:  Weakness, altered mental status  EXAM: CHEST  2 VIEW  COMPARISON:  Radiograph 06/26/2014, CT 08/10/2013, radiograph 06/26/2014  FINDINGS: Enlarged cardiac silhouette is unchanged.  No effusion, infiltrate, or pneumothorax. 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  IMPRESSION: 1. Cardiomegaly without acute cardiopulmonary findings.   Electronically Signed   By: Suzy Bouchard M.D.   On: 07/12/2014 16:29   Ct Head Wo Contrast  07/12/2014   CLINICAL DATA:  Generalized weakness.  No known injury.  EXAM: CT HEAD WITHOUT CONTRAST  TECHNIQUE: Contiguous axial images were obtained from the base of the skull through the vertex without intravenous contrast.  COMPARISON:  06/26/2014  FINDINGS: Ventricles are normal in configuration. There is ventricular and sulcal enlargement reflecting mild atrophy.  No parenchymal masses or mass effect. No evidence of a recent infarct. There are mild areas of white matter hypoattenuation most consistent with chronic microvascular ischemic change.  There are no extra-axial masses or abnormal fluid collections.  There is no intracranial hemorrhage.  Visualized sinuses and mastoid air cells are clear. No skull lesion.  IMPRESSION: 1. No acute intracranial abnormalities. No change from the prior study.   Electronically Signed   By: Lajean Manes M.D.   On: 07/12/2014 15:44   Mr Brain Wo Contrast  07/12/2014   CLINICAL DATA:  Left-sided weakness. Headache. Altered mental status.  EXAM: MRI HEAD WITHOUT CONTRAST  TECHNIQUE: Multiplanar, multiecho pulse sequences of the brain and surrounding structures were obtained  without intravenous contrast.  COMPARISON:  07/12/2014 head CT.  01/22/2010 brain MR.  FINDINGS: Exam is motion degraded.  No acute infarct.  No intracranial hemorrhage.  Significant white matter changes most suggestive of result of small vessel disease.  Global atrophy without hydrocephalus.  No intracranial mass lesion noted on this unenhanced exam.  Decreased signal intensity of bone marrow may be related to patient's habitus. Correlation with CBC to exclude anemia contributing to this appearance may be considered  Major  intracranial vascular structures are patent.  Cervical medullary junction, pituitary region, pineal region and orbital structures unremarkable.  IMPRESSION: Exam is motion degraded.  No acute infarct.  Significant white matter changes most suggestive of result of small vessel disease.  Global atrophy without hydrocephalus.   Electronically Signed   By: Chauncey Cruel M.D.   On: 07/12/2014 19:40   US Venous Img Lower Unilateral Left  07/12/2014   CLINICAL DATA:  Left lower extremity pain, edema and weakness. Evaluate for DVT.  EXAM: LEFT LOWER EXTREMITY VENOUS DOPPLER ULTRASOUND  TECHNIQUE: Gray-scale sonography with graded compression, as well as color Doppler and duplex ultrasound were performed to evaluate the lower extremity deep venous systems from the level of the common femoral vein and including the common femoral, femoral, profunda femoral, popliteal and calf veins including the posterior tibial, peroneal and gastrocnemius veins when visible. The superficial great saphenous vein was also interrogated. Spectral Doppler was utilized to evaluate flow at rest and with distal augmentation maneuvers in the common femoral, femoral and popliteal veins.  COMPARISON:  None.  FINDINGS: Common Femoral Vein: No evidence of thrombus. Normal compressibility, respiratory phasicity and response to augmentation.  Saphenofemoral Junction: No evidence of thrombus. Normal compressibility and flow on color Doppler imaging.  Profunda Femoral Vein: No evidence of thrombus. Normal compressibility and flow on color Doppler imaging.  Femoral Vein: No evidence of thrombus. Normal compressibility, respiratory phasicity and response to augmentation.  Popliteal Vein: No evidence of thrombus. Normal compressibility, respiratory phasicity and response to augmentation.  Calf Veins: No evidence of thrombus. Normal compressibility and flow on color Doppler imaging.  Superficial Great Saphenous Vein: No evidence of thrombus. Normal  compressibility and flow on color Doppler imaging.  Venous Reflux:  None.  Other Findings:  None.  IMPRESSION: No evidence of DVT within the left lower extremity.   Electronically Signed   By: Sandi Mariscal M.D.   On: 07/12/2014 16:56   Dg Chest Portable 1 View  07/29/2014   CLINICAL DATA:  Acute onset of chest pain.  EXAM: PORTABLE CHEST - 1 VIEW  COMPARISON:  Chest radiograph performed 07/12/2014  FINDINGS: The lungs are well-aerated. Vascular congestion is noted, without significant pulmonary edema. There is no evidence of focal opacification, pleural effusion or pneumothorax.  The cardiomediastinal silhouette is borderline enlarged. No acute osseous abnormalities are seen. Hardware is partially imaged at the right humeral head.  IMPRESSION: Vascular congestion and borderline cardiomegaly, without significant pulmonary edema.   Electronically Signed   By: Garald Balding M.D.   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 This is her third occurrence. She has decline and/or cancelled EUS on couple of occasions. She had bump in LFTs with pancreatitis. Cannot rule out passage of stone.   Child Pugh class B (perioperative mortality 30%).  Plan: 1. Agree with MR imaging, have changed order to include MRCP. 2. Valium 5mg  orally on call to MRCP. 3. LFTs  in AM. 4. Continue NPO for now.   We would like to thank you for the opportunity to participate in the care of [name] [name].    LOS: 1 day   Neil Crouch  07/29/2014, 8:31 AM  Attending note:  Patient seen and examined this evening. MRCP reviewed. Agree with supportive measures including a vigorous IV fluid support. Her pancreatitis appears to be mild and improving at this point in time. In all likelihood, patient has biliary pancreatitis with recent passage of a common duct stone. Given the lack of focal pancreatic mass over time on cross sectional imaging,  an occult pancreatic tumor is much less likely. Definitive management would include a cholecystectomy, however, preoperative mortality risk would be significant in such a Child's B. patient. She should still go ahead and have a surgical consultation for elective cholecystectomy while she is here. Will advance to  a clearly diet.

## 2014-07-29 NOTE — Progress Notes (Signed)
Utilization Review Completed.[name] [name] T9/17/2015  

## 2014-07-30 ENCOUNTER — Encounter: Payer: Self-pay | Admitting: *Deleted

## 2014-07-30 DIAGNOSIS — K7689 Other specified diseases of liver: Secondary | ICD-10-CM

## 2014-07-30 DIAGNOSIS — D696 Thrombocytopenia, unspecified: Secondary | ICD-10-CM

## 2014-07-30 DIAGNOSIS — K8020 Calculus of gallbladder without cholecystitis without obstruction: Secondary | ICD-10-CM

## 2014-07-30 LAB — GLUCOSE, CAPILLARY
GLUCOSE-CAPILLARY: 149 mg/dL — AB (ref 70–99)
GLUCOSE-CAPILLARY: 183 mg/dL — AB (ref 70–99)
Glucose-Capillary: 162 mg/dL — ABNORMAL HIGH (ref 70–99)
Glucose-Capillary: 179 mg/dL — ABNORMAL HIGH (ref 70–99)
Glucose-Capillary: 229 mg/dL — ABNORMAL HIGH (ref 70–99)
Glucose-Capillary: 202 mg/dL — ABNORMAL HIGH (ref 70–99)

## 2014-07-30 LAB — HEPATIC FUNCTION PANEL
ALBUMIN: 3.1 g/dL — AB (ref 3.5–5.2)
ALT: 66 U/L — AB (ref 0–35)
AST: 71 U/L — AB (ref 0–37)
Alkaline Phosphatase: 133 U/L — ABNORMAL HIGH (ref 39–117)
BILIRUBIN DIRECT: 0.5 mg/dL — AB (ref 0.0–0.3)
Indirect Bilirubin: 1.2 mg/dL — ABNORMAL HIGH (ref 0.3–0.9)
Total Bilirubin: 1.7 mg/dL — ABNORMAL HIGH (ref 0.3–1.2)
Total Protein: 5.9 g/dL — ABNORMAL LOW (ref 6.0–8.3)

## 2014-07-30 LAB — BASIC METABOLIC PANEL
ANION GAP: 11 (ref 5–15)
BUN: 16 mg/dL (ref 6–23)
CALCIUM: 8.3 mg/dL — AB (ref 8.4–10.5)
CHLORIDE: 106 meq/L (ref 96–112)
CO2: 22 mEq/L (ref 19–32)
CREATININE: 0.81 mg/dL (ref 0.50–1.10)
GFR calc Af Amer: 83 mL/min — ABNORMAL LOW (ref >90)
GFR calc non Af Amer: 71 mL/min — ABNORMAL LOW (ref >90)
Glucose, Bld: 180 mg/dL — ABNORMAL HIGH (ref 70–99)
Potassium: 4.4 mEq/L (ref 3.7–5.3)
Sodium: 139 mEq/L (ref 137–147)

## 2014-07-30 LAB — CBC
HEMATOCRIT: 35.2 % — AB (ref 36.0–46.0)
Hemoglobin: 11.7 g/dL — ABNORMAL LOW (ref 12.0–15.0)
MCH: 25.8 pg — AB (ref 26.0–34.0)
MCHC: 33.2 g/dL (ref 30.0–36.0)
MCV: 77.7 fL — AB (ref 78.0–100.0)
PLATELETS: 100 10*3/uL — AB (ref 150–400)
RBC: 4.53 MIL/uL (ref 3.87–5.11)
RDW: 14.6 % (ref 11.5–15.5)
WBC: 3.6 10*3/uL — ABNORMAL LOW (ref 4.0–10.5)

## 2014-07-30 LAB — MRSA PCR SCREENING: MRSA by PCR: NEGATIVE

## 2014-07-30 LAB — PROTIME-INR
INR: 1.28 (ref 0.00–1.49)
Prothrombin Time: 16 seconds — ABNORMAL HIGH (ref 11.6–15.2)

## 2014-07-30 LAB — LIPASE, BLOOD: Lipase: 120 U/L — ABNORMAL HIGH (ref 11–59)

## 2014-07-30 MED ORDER — PANTOPRAZOLE SODIUM 40 MG PO TBEC
40.0000 mg | DELAYED_RELEASE_TABLET | Freq: Two times a day (BID) | ORAL | Status: DC
  Administered 2014-07-30 – 2014-07-31 (×2): 40 mg via ORAL
  Filled 2014-07-30 (×2): qty 1

## 2014-07-30 NOTE — Progress Notes (Signed)
Subjective:  Feels much better. Tolerated diet. Asking about when she can go home.  Objective: Vital signs in last 24 hours: Temp:  [98 F (36.7 C)-98.7 F (37.1 C)] 98.4 F (36.9 C) (09/18 0627) Pulse Rate:  [70-82] 74 (09/18 0627) Resp:  [18] 18 (09/18 0627) BP: (104-128)/(40-52) 128/52 mmHg (09/18 0931) SpO2:  [97 %-100 %] 100 % (09/18 0627)   General:   Alert,  Well-developed, well-nourished, pleasant and cooperative in NAD Head:  Normocephalic and atraumatic. Eyes:  Sclera clear, no icterus.  Chest: CTA bilaterally without rales, rhonchi, crackles.    Heart:  Regular rate and rhythm; no murmurs, clicks, rubs,  or gallops. Abdomen:  Soft, mild epig tenderness and nondistended. No masses, hepatosplenomegaly or hernias noted. Normal bowel sounds, without guarding, and without rebound.   Extremities:  Without clubbing, deformity or edema. Neurologic:  Alert and  oriented x4;  grossly normal neurologically. Skin:  Intact without significant lesions or rashes. Psych:  Alert and cooperative. Normal mood and affect.  Intake/Output from previous day: 08/19/2023 0701 - 09/18 0700 In: 1743.8 [I.V.:1543.8; IV Piggyback:200] Out: 2800 [Urine:2800] Intake/Output this shift: Total I/O In: 240 [P.O.:240] Out: 250 [Urine:250]  Lab Results: CBC  Recent Labs  07/28/14 2026 07/30/14 0645  WBC 9.0 3.6*  HGB 13.6 11.7*  HCT 40.2 35.2*  MCV 76.4* 77.7*  PLT 151 100*   BMET  Recent Labs  07/28/14 2026 2014/08/18 0558 07/30/14 0645  NA 141 137 139  K 3.9 4.5 4.4  CL 101 103 106  CO2 25 23 22   GLUCOSE 136* 315* 180*  BUN 22 21 16   CREATININE 0.95 0.83 0.81  CALCIUM 9.7 8.7 8.3*   LFTs  Recent Labs  07/28/14 2026 08/18/2014 0558 07/30/14 0645  BILITOT 1.7* 1.9* 1.7*  BILIDIR  --   --  0.5*  IBILI  --   --  1.2*  ALKPHOS 99 142* 133*  AST 25 204* 71*  ALT 14 101* 66*  PROT 7.0 6.0 5.9*  ALBUMIN 3.8 3.2* 3.1*    Recent Labs  07/28/14 2026 18-Aug-2014 0558 07/30/14 0645   LIPASE 1952* 2626* 120*   PT/INR  Recent Labs  07/30/14 0645  LABPROT 16.0*  INR 1.28      Imaging Studies: Mr 3d Recon At Scanner  18-Aug-2014   CLINICAL DATA:  Cirrhosis and portal hypertension. Reported diagnosis of pancreatitis  EXAM: MRI ABDOMEN WITHOUT AND WITH CONTRAST (INCLUDING MRCP)  TECHNIQUE: Multiplanar multisequence MR imaging of the abdomen was performed both before and after the administration of intravenous contrast. Heavily T2-weighted images of the biliary and pancreatic ducts were obtained, and three-dimensional MRCP images were rendered by post processing.  CONTRAST:  6mL MULTIHANCE GADOBENATE DIMEGLUMINE 529 MG/ML IV SOLN  COMPARISON:  The most recent similar comparison exam is abdominal ultrasound 11/19/2013 and CT abdomen/ pelvis 04/26/2013. MRI abdomen 06/29/2011.  FINDINGS: Multiple series are degraded by patient motion. There is mild to moderate diffuse pancreatic edema and trace peripancreatic fluid. Nodular hepatic contour and splenomegaly reidentified compatible with previously diagnosed cirrhosis and evidence of portal hypertension. Trace pericholecystic fluid and or gallbladder wall thickening reidentified with dependent tiny stones noted. 1.3 cm cyst at the dome of the right hepatic lobe reidentified as well as T2 hyperintense hepatic lesions measuring less than 5 mm statistically most likely cysts or hamartomas.  Bilateral T2 hyperintense renal cortical cysts are reidentified. 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 Adrenal glands are normal. No focal splenic lesion. No intrahepatic, common duct,  or pancreatic ductal dilatation. No lymphadenopathy. Visualized bowel is unremarkable.  After administration of contrast, visualization is again suboptimal on multiple series because of patient respiratory motion. Allowing for this, no abnormal enhancement is seen. There is inhomogeneous hypoenhancement of the pancreas.  On heavily T2 weighted sequences,  No common duct filling defect, focal cut off, or other abnormality is identified.  IMPRESSION: Findings compatible with mild-to-moderate pancreatitis without complicating features such as peripancreatic fluid collection to suggest pseudocyst formation.  No common duct filling defect or contour abnormality.  Gallstones with mild gallbladder wall thickening and/or pericholecystic fluid compatible with hypoproteinemia/changes of cirrhosis and portal hypertension. Coincident cholecystitis would be significantly less likely.   Electronically Signed   By: Conchita Paris M.D.   On: 07/29/2014 13:14    Dg Chest Portable 1 View  07/29/2014   CLINICAL DATA:  Acute onset of chest pain.  EXAM: PORTABLE CHEST - 1 VIEW  COMPARISON:  Chest radiograph performed 07/12/2014  FINDINGS: The lungs are well-aerated. Vascular congestion is noted, without significant pulmonary edema. There is no evidence of focal opacification, pleural effusion or pneumothorax.  The cardiomediastinal silhouette is borderline enlarged. No acute osseous abnormalities are seen. Hardware is partially imaged at the right humeral head.  IMPRESSION: Vascular congestion and borderline cardiomegaly, without significant pulmonary edema.   Electronically Signed   By: Garald Balding M.D.   On: 07/29/2014 00:23   Mr Abd W/wo Cm/mrcp  07/29/2014   CLINICAL DATA:  Cirrhosis and portal hypertension. Reported diagnosis of pancreatitis  EXAM: MRI ABDOMEN WITHOUT AND WITH CONTRAST (INCLUDING MRCP)  TECHNIQUE: Multiplanar multisequence MR imaging of the abdomen was performed both before and after the administration of intravenous contrast. Heavily T2-weighted images of the biliary and pancreatic ducts were obtained, and three-dimensional MRCP images were rendered by post processing.  CONTRAST:  85mL MULTIHANCE GADOBENATE DIMEGLUMINE 529 MG/ML IV SOLN  COMPARISON:  The most recent similar comparison exam is abdominal ultrasound 11/19/2013 and CT abdomen/ pelvis  04/26/2013. MRI abdomen 06/29/2011.  FINDINGS: Multiple series are degraded by patient motion. There is mild to moderate diffuse pancreatic edema and trace peripancreatic fluid. Nodular hepatic contour and splenomegaly reidentified compatible with previously diagnosed cirrhosis and evidence of portal hypertension. Trace pericholecystic fluid and or gallbladder wall thickening reidentified with dependent tiny stones noted. 1.3 cm cyst at the dome of the right hepatic lobe reidentified as well as T2 hyperintense hepatic lesions measuring less than 5 mm statistically most likely cysts or hamartomas.  Bilateral T2 hyperintense renal cortical cysts are reidentified. 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 Adrenal glands are normal. No focal splenic lesion. No intrahepatic, common duct, or pancreatic ductal dilatation. No lymphadenopathy. Visualized bowel is unremarkable.  After administration of contrast, visualization is again suboptimal on multiple series because of patient respiratory motion. Allowing for this, no abnormal enhancement is seen. There is inhomogeneous hypoenhancement of the pancreas.  On heavily T2 weighted sequences, No common duct filling defect, focal cut off, or other abnormality is identified.  IMPRESSION: Findings compatible with mild-to-moderate pancreatitis without complicating features such as peripancreatic fluid collection to suggest pseudocyst formation.  No common duct filling defect or contour abnormality.  Gallstones with mild gallbladder wall thickening and/or pericholecystic fluid compatible with hypoproteinemia/changes of cirrhosis and portal hypertension. Coincident cholecystitis would be significantly less likely.   Electronically Signed   By: Conchita Paris M.D.   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 Clinically improved.  This is her third occurrence. LFTs improved today. Awaiting surgical consultation but again patient with significant perioperative mortality risk given Child Pugh B class. Patient may not be interested. Discussed at length with Dr. Caryn Section and Dr. Arnoldo Morale.   Plan: 1. LFTs AM. 2. Agree with full liquids. 3. Back down on IV fluids. 4. Consider ursodiol for attempt of gallstone dissolution if surgery not undertaken.    LOS: 2 days   Neil Crouch  07/30/2014, 1:04 PM

## 2014-07-30 NOTE — Clinical Social Work Note (Signed)
CSW met with pt again after PT evaluation. She states she plans to go home at d/c and requests home health. CSW notified CM. Pt's husband has already informed [facility] of plan.  [name], [place]

## 2014-07-30 NOTE — Care Management Note (Unsigned)
    Page 1 of 1   07/30/2014     5:46:35 PM CARE MANAGEMENT NOTE 07/30/2014  Patient:  STORMI, VANDEVELDE   Account Number:  [id number]  Date Initiated:  07/30/2014  Documentation initiated by:  Vladimir Creeks  Subjective/Objective Assessment:   Admitted with  NASH pancreatitis. pt is from [facility], but she and spouse have desided she will return home, and not back to SNF. She needs HH and uses [facility]. She has all DME from being at home previously.     Action/Plan:   [facility] set up throught L.Lajuana Ripple, Therapist, sports, liasion   Anticipated DC Date:  07/31/2014   Anticipated DC Plan:  [place]  In-house referral  Clinical Social Worker      DC Planning Services  CM consult      [facility]  HOME HEALTH   Choice offered to / List presented to:  C-1 Patient        [place] arranged  HH-1 RN  HH-10 DISEASE MANAGEMENT  HH-2 PT      Status of service:  In process, will continue to follow Medicare Important Message given?  YES (If response is "NO", the following Medicare IM given date fields will be blank) Date Medicare IM given:  07/30/2014 Medicare IM given by:  Vladimir Creeks Date Additional Medicare IM given:   Additional Medicare IM given by:    Discharge Disposition:    Per UR Regulation:  Reviewed for med. necessity/level of care/duration of stay  If discussed at [place] of Stay Meetings, dates discussed:    Comments:  07/30/14/1500 Vladimir Creeks RN/CM

## 2014-07-30 NOTE — Evaluation (Signed)
Physical Therapy Evaluation Patient Details Name: [name] [name] MRN: [id number] DOB: [birth year] Today's Date: 07/30/2014   History of Present Illness  Pt returns to the hospital from a 3 week stay at [facility] with complaint of abdominal pain and nausea.  She has a history of gallstone panreatitis, cholelithiasis, chronic gastritis, and NASH liver failure.  Her current diagnosis is that of pancreatitis.  She normally lives with her husband at home.  Pt states that she does not feel much stronger after 3 weeks in rehab and has only been able to ambulate short distances with a walker in therapy with a w/c pulled behind her.  She has a recent fx to the right humerus for which she had an OTIF and has limited mobility in that arm.  She reports that her hardware has loosened and will have to have more surgery for it in the future.  Clinical Impression   Pt is seen for evaluation today.  She is alert and oriented, very cooperative.  Pt status is very similar to last hospital PT evaluation.  She has generalized LE weakness, limited use of the RUE and general deconditioning.  She is able to transfer bed to chair with supervision and is only able to ambulate about 15' using a walker with very close guarding.  Her gait is fairly unstable and she remains at high risk of falling.  She states that she would prefer to go home at discharge as she was planning to leave the [facility] next week anyway.  I think that if her husband can manage her at her current functional level, this would be fine.  We could then introduce HHPT.  At this point, walking is purely an exercise and I have told her that she will not be safe to ambulate with her husband's assist.  She will need to primarily function out of a w/c and ambulate with therapist assist.    Follow Up Recommendations Home health PT;SNF (per pt/family preference)    Equipment Recommendations  None recommended by PT    Recommendations for Other Services   none--pt did not have a  good experience with therapy for her RUE fracture    Precautions / Restrictions Precautions Precautions: Fall Restrictions Weight Bearing Restrictions: No      Mobility  Bed Mobility Overal bed mobility: Modified Independent                Transfers Overall transfer level: Needs assistance Equipment used: Rolling walker (2 wheeled) Transfers: Sit to/from [facility] Sit to Stand: Supervision Stand pivot transfers: Supervision          Ambulation/Gait Ambulation/Gait assistance: Min guard Ambulation Distance (Feet): 15 Feet Assistive device: Rolling walker (2 wheeled) Gait Pattern/deviations: Decreased step length - right;Decreased step length - left;Drifts right/left   Gait velocity interpretation: Below normal speed for age/gender General Gait Details: gait with a walker is unstable partially due to bilateral knee weakness...she has difficulty maintaining full knee extension in stance  Stairs            Wheelchair Mobility    Modified Rankin (Stroke Patients Only)       Balance Overall balance assessment: History of Falls;Needs assistance Sitting-balance support: No upper extremity supported;Feet supported Sitting balance-Leahy Scale: Good     Standing balance support: Bilateral upper extremity supported Standing balance-Leahy Scale: Fair  Pertinent Vitals/Pain Pain Assessment: No/denies pain    Home Living Family/patient expects to be discharged to:: Private residence Living Arrangements: Spouse/significant other Available Help at Discharge: Family;Available 24 hours/day Type of Home: House Home Access: Ramped entrance     Home Layout: One level Home Equipment: Walker - 2 wheels;Cane - single point;Bedside commode;Tub bench;Wheelchair - Press photographer      Prior Function Level of Independence: Needs assistance   Gait / Transfers Assistance Needed: transfers with SBA,  ambulated for short distances using a walker with SBA and w/c behind her  ADL's / Homemaking Assistance Needed: assist needed for all ADLs        Hand Dominance   Dominant Hand: Right    Extremity/Trunk Assessment   Upper Extremity Assessment: RUE deficits/detail RUE Deficits / Details: limited active and passive ROM of right shoulder         Lower Extremity Assessment: Generalized weakness         Communication   Communication: No difficulties  Cognition Arousal/Alertness: Awake/alert Behavior During Therapy: WFL for tasks assessed/performed Overall Cognitive Status: Within Functional Limits for tasks assessed                      General Comments      Exercises        Assessment/Plan    PT Assessment Patient needs continued PT services  PT Diagnosis Difficulty walking;Generalized weakness;Abnormality of gait   PT Problem List Decreased strength;Decreased activity tolerance;Decreased balance;Decreased mobility;Obesity  PT Treatment Interventions Gait training;Functional mobility training;Therapeutic exercise;Balance training;Patient/family education   PT Goals (Current goals can be found in the Care Plan section) Acute Rehab PT Goals Patient Stated Goal: wants to go home PT Goal Formulation: With patient Time For Goal Achievement: 08/06/14 Potential to Achieve Goals: Fair    Frequency Min 3X/week   Barriers to discharge   none-home now has a ramp at the entrance    Co-evaluation               End of Session Equipment Utilized During Treatment: Gait belt Activity Tolerance: Patient limited by fatigue Patient left: in chair;with call bell/phone within reach;with chair alarm set           Time: 4196-2229 PT Time Calculation (min): 47 min   Charges:   PT Evaluation $Initial PT Evaluation Tier I: 1 Procedure     PT G CodesDemetrios Isaacs L 07/30/2014, 12:42 PM

## 2014-07-30 NOTE — Progress Notes (Addendum)
REVIEWED-NO ADDITIONAL RECOMMENDATIONS. ADVANCE DIET-LOW FAT/NO CHICKEN

## 2014-07-30 NOTE — Progress Notes (Signed)
[facility] PROGRESS NOTE  [name] [name] WNI:[id number] DOB: Jul 16, 1941 DOA: 07/28/2014 PCP: Purvis Kilts, MD    Code Status: Full code Family Communication: Discussed with daughter, Ms. Moore on 08-07-23. Disposition Plan: Discharge when clinically appropriate   Consultants:  Gastroenterology  General surgery pending  Procedures:  MRCP  Antibiotics:  Starting Cipro 9/16>>  HPI/Subjective: The patient says she feels a lot better. She has much less abdominal pain. No nausea vomiting. No bowel movement overnight.   Objective: Filed Vitals:   07/30/14 0931  BP: 128/52  Pulse:   Temp:   Resp:    temperature 98.4. Pulse 74. Respiratory rate 18. Blood pressure 120/52. Oxygen saturation 100% on room air.  Intake/Output Summary (Last 24 hours) at 07/30/14 1009 Last data filed at 07/30/14 0956  Gross per 24 hour  Intake 1983.75 ml  Output   2550 ml  Net -566.25 ml   Filed Weights   07/28/14 2012 06-Aug-2014 0043  Weight: 103.42 kg (228 lb) 104.4 kg (230 lb 2.6 oz)    Exam:   General: Obese 73 year old Caucasian woman laying in bed, in no acute distress.  Cardiovascular: S1, S2, no murmurs rubs or gallops.  Respiratory: Decreased breath sounds in the bases, but clear anteriorly.  Abdomen: Obese, positive bowel sounds, soft, minimal tenderness in the epigastrium.  No distention or masses palpated.  Musculoskeletal: No acute hot red joints. Pedal pulses palpable. No pedal edema.  Neurologic: She is alert and oriented x2. Cranial nerves II through XII are grossly intact.   Data Reviewed: Basic Metabolic Panel:  Recent Labs Lab 07/28/14 2026 08-06-14 0558 07/30/14 0645  NA 141 137 139  K 3.9 4.5 4.4  CL 101 103 106  CO2 25 23 22   GLUCOSE 136* 315* 180*  BUN 22 21 16   CREATININE 0.95 0.83 0.81  CALCIUM 9.7 8.7 8.3*   Liver Function Tests:  Recent Labs Lab 07/28/14 2026 2014/08/06 0558 07/30/14 0645  AST 25 204* 71*  ALT 14 101* 66*   ALKPHOS 99 142* 133*  BILITOT 1.7* 1.9* 1.7*  PROT 7.0 6.0 5.9*  ALBUMIN 3.8 3.2* 3.1*    Recent Labs Lab 07/28/14 2026 06-Aug-2014 0558 07/30/14 0645  LIPASE 1952* 2626* 120*   No results found for this basename: AMMONIA,  in the last 168 hours CBC:  Recent Labs Lab 07/28/14 2026 07/30/14 0645  WBC 9.0 3.6*  NEUTROABS 6.5  --   HGB 13.6 11.7*  HCT 40.2 35.2*  MCV 76.4* 77.7*  PLT 151 100*   Cardiac Enzymes:  Recent Labs Lab 07/28/14 2340  TROPONINI <0.30   BNP (last 3 results) No results found for this basename: PROBNP,  in the last 8760 hours CBG:  Recent Labs Lab 08/06/14 1545 2014/08/06 2153 07/30/14 0123 07/30/14 0632 07/30/14 0751  GLUCAP 135* 119* 149* 162* 179*    No results found for this or any previous visit (from the past 240 hour(s)).   Studies: Mr 3d Recon At Scanner  06-Aug-2014   CLINICAL DATA:  Cirrhosis and portal hypertension. Reported diagnosis of pancreatitis  EXAM: MRI ABDOMEN WITHOUT AND WITH CONTRAST (INCLUDING MRCP)  TECHNIQUE: Multiplanar multisequence MR imaging of the abdomen was performed both before and after the administration of intravenous contrast. Heavily T2-weighted images of the biliary and pancreatic ducts were obtained, and three-dimensional MRCP images were rendered by post processing.  CONTRAST:  62mL MULTIHANCE GADOBENATE DIMEGLUMINE 529 MG/ML IV SOLN  COMPARISON:  The most recent similar comparison exam is abdominal ultrasound 11/19/2013  and CT abdomen/ pelvis 04/26/2013. MRI abdomen 06/29/2011.  FINDINGS: Multiple series are degraded by patient motion. There is mild to moderate diffuse pancreatic edema and trace peripancreatic fluid. Nodular hepatic contour and splenomegaly reidentified compatible with previously diagnosed cirrhosis and evidence of portal hypertension. Trace pericholecystic fluid and or gallbladder wall thickening reidentified with dependent tiny stones noted. 1.3 cm cyst at the dome of the right hepatic lobe  reidentified as well as T2 hyperintense hepatic lesions measuring less than 5 mm statistically most likely cysts or hamartomas.  Bilateral T2 hyperintense renal cortical cysts are reidentified. 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 Adrenal glands are normal. No focal splenic lesion. No intrahepatic, common duct, or pancreatic ductal dilatation. No lymphadenopathy. Visualized bowel is unremarkable.  After administration of contrast, visualization is again suboptimal on multiple series because of patient respiratory motion. Allowing for this, no abnormal enhancement is seen. There is inhomogeneous hypoenhancement of the pancreas.  On heavily T2 weighted sequences, No common duct filling defect, focal cut off, or other abnormality is identified.  IMPRESSION: Findings compatible with mild-to-moderate pancreatitis without complicating features such as peripancreatic fluid collection to suggest pseudocyst formation.  No common duct filling defect or contour abnormality.  Gallstones with mild gallbladder wall thickening and/or pericholecystic fluid compatible with hypoproteinemia/changes of cirrhosis and portal hypertension. Coincident cholecystitis would be significantly less likely.   Electronically Signed   By: Conchita Paris M.D.   On: 07/29/2014 13:14   Dg Chest Portable 1 View  07/29/2014   CLINICAL DATA:  Acute onset of chest pain.  EXAM: PORTABLE CHEST - 1 VIEW  COMPARISON:  Chest radiograph performed 07/12/2014  FINDINGS: The lungs are well-aerated. Vascular congestion is noted, without significant pulmonary edema. There is no evidence of focal opacification, pleural effusion or pneumothorax.  The cardiomediastinal silhouette is borderline enlarged. No acute osseous abnormalities are seen. Hardware is partially imaged at the right humeral head.  IMPRESSION: Vascular congestion and borderline cardiomegaly, without significant pulmonary edema.   Electronically Signed   By: Garald Balding  M.D.   On: 07/29/2014 00:23   Mr Abd W/wo Cm/mrcp  07/29/2014   CLINICAL DATA:  Cirrhosis and portal hypertension. Reported diagnosis of pancreatitis  EXAM: MRI ABDOMEN WITHOUT AND WITH CONTRAST (INCLUDING MRCP)  TECHNIQUE: Multiplanar multisequence MR imaging of the abdomen was performed both before and after the administration of intravenous contrast. Heavily T2-weighted images of the biliary and pancreatic ducts were obtained, and three-dimensional MRCP images were rendered by post processing.  CONTRAST:  68mL MULTIHANCE GADOBENATE DIMEGLUMINE 529 MG/ML IV SOLN  COMPARISON:  The most recent similar comparison exam is abdominal ultrasound 11/19/2013 and CT abdomen/ pelvis 04/26/2013. MRI abdomen 06/29/2011.  FINDINGS: Multiple series are degraded by patient motion. There is mild to moderate diffuse pancreatic edema and trace peripancreatic fluid. Nodular hepatic contour and splenomegaly reidentified compatible with previously diagnosed cirrhosis and evidence of portal hypertension. Trace pericholecystic fluid and or gallbladder wall thickening reidentified with dependent tiny stones noted. 1.3 cm cyst at the dome of the right hepatic lobe reidentified as well as T2 hyperintense hepatic lesions measuring less than 5 mm statistically most likely cysts or hamartomas.  Bilateral T2 hyperintense renal cortical cysts are reidentified. 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 Adrenal glands are normal. No focal splenic lesion. No intrahepatic, common duct, or pancreatic ductal dilatation. No lymphadenopathy. Visualized bowel is unremarkable.  After administration of contrast, visualization is again suboptimal on multiple series because of patient respiratory motion. Allowing for this,  no abnormal enhancement is seen. There is inhomogeneous hypoenhancement of the pancreas.  On heavily T2 weighted sequences, No common duct filling defect, focal cut off, or other abnormality is identified.  IMPRESSION:  Findings compatible with mild-to-moderate pancreatitis without complicating features such as peripancreatic fluid collection to suggest pseudocyst formation.  No common duct filling defect or contour abnormality.  Gallstones with mild gallbladder wall thickening and/or pericholecystic fluid compatible with hypoproteinemia/changes of cirrhosis and portal hypertension. Coincident cholecystitis would be significantly less likely.   Electronically Signed   By: Conchita Paris M.D.   On: 07/29/2014 13:14    Scheduled Meds: . aspirin EC  325 mg Oral q morning - 10a  . ciprofloxacin  400 mg Intravenous Q12H  . estradiol  1 Applicatorful Vaginal N46E  . heparin  5,000 Units Subcutaneous 3 times per day  . insulin aspart  0-15 Units Subcutaneous 6 times per day  . lactulose  30 g Oral BID  . lisinopril  10 mg Oral Daily  . mometasone-formoterol  2 puff Inhalation QHS  . pantoprazole (PROTONIX) IV  40 mg Intravenous Q24H  . pregabalin  100 mg Oral Daily  . rifaximin  200 mg Oral TID  . spironolactone  50 mg Oral Daily   Continuous Infusions: . sodium chloride 150 mL/hr at 07/30/14 0420   Assessment and plan: Principal Problem:   Acute gallstone pancreatitis Active Problems:   Elevated LFTs   Uncontrolled type 2 diabetes mellitus with peripheral neuropathy   Liver cirrhosis secondary to NASH   UTI (urinary tract infection)   OBESITY   HYPERTENSION   CAD, NATIVE VESSEL   GERD   ANXIETY DISORDER, HX OF   Thrombocytopenia   1. Recurrent pancreatitis, presumed to be secondary to acute gallstone/biliary  pancreatitis.  She is improving clinically and symptomatically. Her lipase has decreased significantly. Her MRCP revealed mild to moderate pancreatitis, but did not reveal a retained stone or ductal dilatation; it was also not suggestive of an acute cholecystitis. Gastroenterology has seen and evaluated the patient. Per Dr. Gala Romney, she likely had a recent passage of a common bile duct stone. He  also went on to say that definitive management would include a cholecystectomy, but the patient is at significant preoperative mortality risk given her Child's B. score. General surgeon, Dr. Arnoldo Morale has been consulted. However, it is likely that the patient will not agree to a cholecystectomy. Her diet was advanced to clear liquids by GI. IV fluid rate was increased. Continue PPI and supportive treatment.   (The patient was hospitalized in June 2015 for the same. There was to be scheduled for an outpatient EUS by GI, but apparently, the patient canceled the procedure because of death in her family. The plan was to also refer her to Dr. Arnoldo Morale for evaluation of an elective cholecystectomy following the EUS, but this was not done.)   Elevated LFTs/nonspecific hepatitis. The patient's liver transaminases were within normal limits on admission, but day did increase. They are trending back down.   Hepatic cirrhosis secondary to [place]. She is followed by [facility]. Dr. Tilman Neat assessment noted and appreciated. Spironolactone, Lasix, lactulose and rifaximin were initially held, but all have been restarted with exception of Lasix. We'll continue to monitor for restarting Lasix following IV fluid hydration.  Hypertension. Currently stable on ACE inhibitor.  Type 2 diabetes mellitus with peripheral neuropathy. Her capillary blood glucose has been reasonable over the past few checks. With the start of her clear liquid diet, will restart Levemir  at a smaller dose and titrate accordingly.   Abnormal urinalysis, treating as a urinary tract infection. She has a history of chronic recurrent urinary tract infections. Her urine was malodorous, but she denied dysuria. In the setting of acute pancreatitis, Cipro was started empirically. Urine culture is pending.    Leukopenia and thrombocytopenia. Her normal white blood cell count and platelet count were likely the consequence of hemoconcentration.  With IV fluid hydration, her counts have fallen. Her baseline WBC ranges from 2.5-30. Her baseline platelet count ranges from 77 to 115. We will continue to monitor.  Chronic deconditioning with chronic bilateral lower extremity weakness.   she was undergoing physical therapy at the [facility]. We'll order PT evaluation today.      Time spent: 35 minutes    [facility] Pager [phone]. If 7PM-7AM, please contact night-coverage at [email or web address], password [facility] 07/30/2014, 10:09 AM  LOS: 2 days

## 2014-07-30 NOTE — Consult Note (Signed)
Patient briefly seen today. She is already been told about cholecystectomy and has no interest in talking to a surgeon about it. Please call me if I may be of further assistance.

## 2014-07-31 ENCOUNTER — Telehealth: Payer: Self-pay | Admitting: Gastroenterology

## 2014-07-31 DIAGNOSIS — N390 Urinary tract infection, site not specified: Secondary | ICD-10-CM

## 2014-07-31 DIAGNOSIS — E669 Obesity, unspecified: Secondary | ICD-10-CM

## 2014-07-31 LAB — CBC
HCT: 31.9 % — ABNORMAL LOW (ref 36.0–46.0)
Hemoglobin: 10.8 g/dL — ABNORMAL LOW (ref 12.0–15.0)
MCH: 25.9 pg — ABNORMAL LOW (ref 26.0–34.0)
MCHC: 33.9 g/dL (ref 30.0–36.0)
MCV: 76.5 fL — ABNORMAL LOW (ref 78.0–100.0)
Platelets: 99 10*3/uL — ABNORMAL LOW (ref 150–400)
RBC: 4.17 MIL/uL (ref 3.87–5.11)
RDW: 14.2 % (ref 11.5–15.5)
WBC: 3.9 10*3/uL — AB (ref 4.0–10.5)

## 2014-07-31 LAB — COMPREHENSIVE METABOLIC PANEL
ALT: 42 U/L — ABNORMAL HIGH (ref 0–35)
AST: 34 U/L (ref 0–37)
Albumin: 2.8 g/dL — ABNORMAL LOW (ref 3.5–5.2)
Alkaline Phosphatase: 113 U/L (ref 39–117)
Anion gap: 11 (ref 5–15)
BUN: 10 mg/dL (ref 6–23)
CALCIUM: 8.1 mg/dL — AB (ref 8.4–10.5)
CHLORIDE: 104 meq/L (ref 96–112)
CO2: 21 meq/L (ref 19–32)
CREATININE: 0.65 mg/dL (ref 0.50–1.10)
GFR calc Af Amer: >90 mL/min (ref >90)
GFR, EST NON AFRICAN AMERICAN: 87 mL/min — AB (ref >90)
Glucose, Bld: 190 mg/dL — ABNORMAL HIGH (ref 70–99)
Potassium: 3.7 mEq/L (ref 3.7–5.3)
Sodium: 136 mEq/L — ABNORMAL LOW (ref 137–147)
Total Bilirubin: 1.3 mg/dL — ABNORMAL HIGH (ref 0.3–1.2)
Total Protein: 5.5 g/dL — ABNORMAL LOW (ref 6.0–8.3)

## 2014-07-31 LAB — URINE CULTURE

## 2014-07-31 LAB — GLUCOSE, CAPILLARY
GLUCOSE-CAPILLARY: 171 mg/dL — AB (ref 70–99)
GLUCOSE-CAPILLARY: 187 mg/dL — AB (ref 70–99)
Glucose-Capillary: 185 mg/dL — ABNORMAL HIGH (ref 70–99)
Glucose-Capillary: 251 mg/dL — ABNORMAL HIGH (ref 70–99)

## 2014-07-31 LAB — LIPASE, BLOOD: LIPASE: 34 U/L (ref 11–59)

## 2014-07-31 MED ORDER — CIPROFLOXACIN HCL 500 MG PO TABS: 500.0000 mg | ORAL_TABLET | Freq: Two times a day (BID) | ORAL | Status: DC

## 2014-07-31 MED ORDER — URSODIOL 500 MG PO TABS: ORAL_TABLET | ORAL | Status: DC

## 2014-07-31 MED ORDER — CIPROFLOXACIN HCL 250 MG PO TABS
500.0000 mg | ORAL_TABLET | Freq: Two times a day (BID) | ORAL | Status: DC
Start: 2014-07-31 — End: 2014-07-31
  Administered 2014-07-31: 500 mg via ORAL
  Filled 2014-07-31: qty 2

## 2014-07-31 NOTE — Discharge Summary (Signed)
Physician Discharge Summary  [name] [name] WPY:[id number] DOB: Jun 26, 1941 DOA: 07/28/2014  PCP: Purvis Kilts, MD  Admit date: 07/28/2014 Discharge date: 07/31/2014  Time spent: Greater than 30 minutes  Recommendations for Outpatient Follow-up:  1. Home health registered nurse and physical therapy ordered.  2.   Discharge Diagnoses:  1. Recurrent acute gallstone/biliary pancreatitis. --- Options given for treatment included Urso, ERCP with sphincterotomy, or surgery at a tertiary care center. --- Patient's preoperative mortality risk would be significant in this Child's B. patient, per gastrocs, Dr. Gala Romney. --- Lipase 34 at discharge. (2626 during the hospital course). 2. Liver cirrhosis secondary to NASH. 3. Elevated LFTS/steatohepatitis. 4. Chronic thrombocytopenia, leukopenia, and anemia. --- WBC 3.9, platelet count 99, hemoglobin 10.8 at the time of discharge. 5. Urinary tract infection. 6. Coronary artery disease and hypertension. Remained stable. 7. Type 2 diabetes mellitus with peripheral neuropathy. 8. GERD. 9. Chronic anxiety. 10. Morbid obesity. 11. Chronic deconditioning with chronic bilateral lower extremity weakness.  Discharge Condition: Improved and stable.  Diet recommendation: Heart healthy/carbohydrate modified.  Filed Weights   07/28/14 2012 07/29/14 0043  Weight: 103.42 kg (228 lb) 104.4 kg (230 lb 2.6 oz)    History of present illness:  The patient is a 73 year old woman with a history of cholelithiasis, previous biliary pancreatitis, cirrhosis secondary to NASH, and diabetes mellitus, who presented from the [facility] on 07/28/14 with a chief complaint of abdominal pain. In the ED, she was afebrile and hemodynamically stable. Her lipase on admission was 1957. Her AST was 25, her ALT was 14 and her total bilirubin was 1.7. Her urinalysis was grossly positive, but it was not a particularly sterile specimen. She was admitted for further evaluation and  management.  Hospital Course:   1. Recurrent pancreatitis, presumed to be secondary to acute gallstone/biliary pancreatitis.  The patient was hospitalized in June 2015 for the same. She was scheduled for evaluation for an outpatient EUS by GI, but apparently the patient canceled the procedure because of death in her family. The plan was to also refer her to Dr. Arnoldo Morale for evaluation of an elective cholecystectomy following the EUS, but this was not done. The patient was started on supportive treatment with analgesics and antiemetics as needed. IV fluids were started for hydration. Bowel rest was ordered and she was made to be n.p.o. IV Protonix was started empirically. Her liver transaminases and lipase were monitored daily. An MRCP was ordered for further evaluation. It revealed mild to moderate pancreatitis, but it did not reveal a retained stone or ductal dilatation; it was also not suggestive of an acute cholecystitis. Gastroenterology was consulted and evaluated the patient. Per Dr. Gala Romney, she likely had a recent passage of a common bile duct stone. He also went on to say that definitive management would include a cholecystectomy, but the patient is at significant preoperative mortality risk given her Child's B. score. General surgeon, Dr. Arnoldo Morale was also consulted and agreed with Dr. Gala Romney. Once the patient improved clinically, she did want surgery. Dr. Oneida Alar ordered Urso for the patient and offered several treatment options including Urso to slowly dissolve the gallstones, ERCP/sphincterotomy, or surgery at a tertiary care center. The patient will followup with Dr. Gala Romney for further evaluation and discussion. Her diet was advanced which she tolerated well. Her lipase completely normalized. Her liver transaminases increased, but trended down prior to discharge.  Elevated LFTs/steatohepatitis  The patient's liver transaminases were within normal limits on admission, but they increased. They trended  back down.  AST was 34, ALT was 42 , and total bilirubin was 1.3 at discharge.  Hepatic cirrhosis secondary to [place].   Spironolactone, Lasix, lactulose and rifaximin were initially held but were all restarted with exception of Lasix. She was vigorously hydrated. At the time of discharge, Lasix was restarted.  Hypertension. Her blood pressures were relatively stable on quinapril.  Type 2 diabetes mellitus with peripheral neuropathy.  Her capillary blood glucose waxed and waned with n.p.o. status and then with restarting her diet. She was started on sliding-scale NovoLog and restarted on Levemir.  Abnormal urinalysis, treating as a urinary tract infection.  She has a history of chronic recurrent urinary tract infections. Her urine was malodorous, but she denied dysuria. In the setting of acute pancreatitis, Cipro was started empirically. Urine culture revealed greater than 100,000 colonies of multiple bacteria, none predominant. She was discharged on 5 more days of Cipro empirically.  Leukopenia and thrombocytopenia.  Her normal white blood cell count and platelet count on admission were likely the consequence of hemoconcentration. With IV fluid hydration, her counts decreased. Her baseline WBC ranges from 2.5-30. Her baseline platelet count ranges from 77 to 115.  Chronic deconditioning with chronic bilateral lower extremity weakness.  She was undergoing physical therapy at the [facility] She no longer wanted to return at the time of discharge. The physical therapist evaluated her and recommended home health physical therapy. This was ordered at discharge.     Procedures:  None  Consultations:  Gastroenterology Dr. Gala Romney and Dr. Oneida Alar  General surgery, Dr. Arnoldo Morale  Discharge Exam: Filed Vitals:   07/31/14 0809  BP: 120/62  Pulse:   Temp:   Resp:    temperature 98.6. Pulse 77. Respiratory rate 12. Blood pressure 120/62. Oxygen saturation 97% on room air.  General: Obese 73 year old  Caucasian woman laying in bed, in no acute distress.  Cardiovascular: S1, S2, no murmurs rubs or gallops.  Respiratory: Decreased breath sounds in the bases, but clear anteriorly.  Abdomen: Obese, positive bowel sounds, soft, nontender. No distention or masses palpated.  Musculoskeletal: No acute hot red joints. Pedal pulses palpable. No pedal edema.  Neurologic: She is alert and oriented x2. Cranial nerves II through XII are grossly intact.     Discharge Instructions You were cared for by a hospitalist during your hospital stay. If you have any questions about your discharge medications or the care you received while you were in the hospital after you are discharged, you can call the unit and asked to speak with the hospitalist on call if the hospitalist that took care of you is not available. Once you are discharged, your primary care physician will handle any further medical issues. Please note that NO REFILLS for any discharge medications will be authorized once you are discharged, as it is imperative that you return to your primary care physician (or establish a relationship with a primary care physician if you do not have one) for your aftercare needs so that they can reassess your need for medications and monitor your lab values.  Discharge Instructions   Diet - low sodium heart healthy    Complete by:  As directed      Increase activity slowly    Complete by:  As directed           Current Discharge Medication List    START taking these medications   Details  ciprofloxacin (CIPRO) 500 MG tablet Take 1 tablet (500 mg total) by mouth 2 (two) times daily. Antibiotic  to be taken for 5 more days Qty: 10 tablet, Refills: 0      CONTINUE these medications which have NOT CHANGED   Details  ALPRAZolam (XANAX) 0.5 MG tablet Take one tablet by mouth three times daily as needed for anxiety Qty: 90 tablet, Refills: 5    aspirin EC 325 MG EC tablet Take 1 tablet (325 mg total) by mouth  every morning. Qty: 30 tablet, Refills: 0    budesonide-formoterol (SYMBICORT) 160-4.5 MCG/ACT inhaler Inhale 2 puffs into the lungs at bedtime.     ESTRACE VAGINAL 0.1 MG/GM vaginal cream Place 1 Applicatorful vaginally every other day. At  bedtime    furosemide (LASIX) 20 MG tablet Take 20 mg by mouth every morning.    insulin aspart (NOVOLOG FLEXPEN) 100 UNIT/ML FlexPen Inject 2-10 Units into the skin 3 (three) times daily with meals. Sliding Scale: 200-250= 2 units 251-300= 4 units 301-350= 6 units 351-400= 8 units If levels are over 400, GIVE 10 UNITS AND CALL MD    insulin detemir (LEVEMIR) 100 UNIT/ML injection Inject 60 Units into the skin at bedtime.     lactulose, encephalopathy, (GENERLAC) 10 GM/15ML SOLN Take 45 mLs (30 g total) by mouth 3 (three) times daily. Qty: 2700 mL, Refills: 0    Multiple Vitamins-Minerals (CENTRUM SILVER PO) Take 1 tablet by mouth daily.     OxyCODONE (OXYCONTIN) 10 mg T12A 12 hr tablet Take 1 tablet (10 mg total) by mouth 2 (two) times daily as needed (severe pain). Qty: 60 tablet, Refills: 0    pregabalin (LYRICA) 100 MG capsule Take one capsule by mouth once daily for pains Qty: 30 capsule, Refills: 5    quinapril (ACCUPRIL) 5 MG tablet Take 5 mg by mouth at bedtime.    rifaximin (XIFAXAN) 550 MG TABS tablet Take 1 tablet (550 mg total) by mouth 2 (two) times daily. Qty: 60 tablet, Refills: 1    spironolactone (ALDACTONE) 50 MG tablet Take 50 mg by mouth every morning.    meclizine (ANTIVERT) 25 MG tablet Take 25 mg by mouth 3 (three) times daily as needed for dizziness or nausea.     NEXIUM 40 MG capsule Take 40 mg by mouth every morning.     triamcinolone cream (KENALOG) 0.1 % Apply 1 application topically daily as needed (for irritation).     ursodiol (URSO FORTE) 500 MG tablet 1 PO BID Qty: 60 tablet, Refills: 3      STOP taking these medications     omeprazole (PRILOSEC) 20 MG capsule      cephALEXin (KEFLEX) 250 MG  capsule        Allergies  Allergen Reactions  . Morphine And Related Itching  . Prochlorperazine Edisylate Other (See Comments)    Causes anxiety/numbness, pt not sure of this allergy  . Compazine [Prochlorperazine Edisylate] Anxiety    Causes anxiety & nervousness   Follow-up Information   Schedule an appointment as soon as possible for a visit with Purvis Kilts, MD. (To be seen in 1-2 weeks)    Specialty:  Family Medicine   Contact information:   [address] [place] [address] [phone]       Schedule an appointment as soon as possible for a visit with Manus Rudd, MD. (To be seen in 3-4 weeks.)    Specialty:  Gastroenterology   Contact information:   [address] [place] [address] [phone]        The results of significant diagnostics from this hospitalization (  including imaging, microbiology, ancillary and laboratory) are listed below for reference.    Significant Diagnostic Studies: Dg Chest 2 View  07/12/2014   CLINICAL DATA:  Weakness, altered mental status  EXAM: CHEST  2 VIEW  COMPARISON:  Radiograph 06/26/2014, CT 08/10/2013, radiograph 06/26/2014  FINDINGS: Enlarged cardiac silhouette is unchanged. No effusion, infiltrate, or pneumothorax. 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  IMPRESSION: 1. Cardiomegaly without acute cardiopulmonary findings.   Electronically Signed   By: Suzy Bouchard M.D.   On: 07/12/2014 16:29   Ct Head Wo Contrast  07/12/2014   CLINICAL DATA:  Generalized weakness.  No known injury.  EXAM: CT HEAD WITHOUT CONTRAST  TECHNIQUE: Contiguous axial images were obtained from the base of the skull through the vertex without intravenous contrast.  COMPARISON:  06/26/2014  FINDINGS: Ventricles are normal in configuration. There is ventricular and sulcal enlargement reflecting mild atrophy.  No parenchymal masses or mass effect. No evidence of a recent infarct. There are mild areas of  white matter hypoattenuation most consistent with chronic microvascular ischemic change.  There are no extra-axial masses or abnormal fluid collections.  There is no intracranial hemorrhage.  Visualized sinuses and mastoid air cells are clear. No skull lesion.  IMPRESSION: 1. No acute intracranial abnormalities. No change from the prior study.   Electronically Signed   By: Lajean Manes M.D.   On: 07/12/2014 15:44   Mr Brain Wo Contrast  07/12/2014   CLINICAL DATA:  Left-sided weakness. Headache. Altered mental status.  EXAM: MRI HEAD WITHOUT CONTRAST  TECHNIQUE: Multiplanar, multiecho pulse sequences of the brain and surrounding structures were obtained without intravenous contrast.  COMPARISON:  07/12/2014 head CT.  01/22/2010 brain MR.  FINDINGS: Exam is motion degraded.  No acute infarct.  No intracranial hemorrhage.  Significant white matter changes most suggestive of result of small vessel disease.  Global atrophy without hydrocephalus.  No intracranial mass lesion noted on this unenhanced exam.  Decreased signal intensity of bone marrow may be related to patient's habitus. Correlation with CBC to exclude anemia contributing to this appearance may be considered  Major intracranial vascular structures are patent.  Cervical medullary junction, pituitary region, pineal region and orbital structures unremarkable.  IMPRESSION: Exam is motion degraded.  No acute infarct.  Significant white matter changes most suggestive of result of small vessel disease.  Global atrophy without hydrocephalus.   Electronically Signed   By: Chauncey Cruel M.D.   On: 07/12/2014 19:40   Mr 3d Recon At Scanner  07/29/2014   CLINICAL DATA:  Cirrhosis and portal hypertension. Reported diagnosis of pancreatitis  EXAM: MRI ABDOMEN WITHOUT AND WITH CONTRAST (INCLUDING MRCP)  TECHNIQUE: Multiplanar multisequence MR imaging of the abdomen was performed both before and after the administration of intravenous contrast. Heavily T2-weighted  images of the biliary and pancreatic ducts were obtained, and three-dimensional MRCP images were rendered by post processing.  CONTRAST:  67mL MULTIHANCE GADOBENATE DIMEGLUMINE 529 MG/ML IV SOLN  COMPARISON:  The most recent similar comparison exam is abdominal ultrasound 11/19/2013 and CT abdomen/ pelvis 04/26/2013. MRI abdomen 06/29/2011.  FINDINGS: Multiple series are degraded by patient motion. There is mild to moderate diffuse pancreatic edema and trace peripancreatic fluid. Nodular hepatic contour and splenomegaly reidentified compatible with previously diagnosed cirrhosis and evidence of portal hypertension. Trace pericholecystic fluid and or gallbladder wall thickening reidentified with dependent tiny stones noted. 1.3 cm cyst at the dome of the right hepatic lobe reidentified as well as T2 hyperintense  hepatic lesions measuring less than 5 mm statistically most likely cysts or hamartomas.  Bilateral T2 hyperintense renal cortical cysts are reidentified. 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 Adrenal glands are normal. No focal splenic lesion. No intrahepatic, common duct, or pancreatic ductal dilatation. No lymphadenopathy. Visualized bowel is unremarkable.  After administration of contrast, visualization is again suboptimal on multiple series because of patient respiratory motion. Allowing for this, no abnormal enhancement is seen. There is inhomogeneous hypoenhancement of the pancreas.  On heavily T2 weighted sequences, No common duct filling defect, focal cut off, or other abnormality is identified.  IMPRESSION: Findings compatible with mild-to-moderate pancreatitis without complicating features such as peripancreatic fluid collection to suggest pseudocyst formation.  No common duct filling defect or contour abnormality.  Gallstones with mild gallbladder wall thickening and/or pericholecystic fluid compatible with hypoproteinemia/changes of cirrhosis and portal hypertension.  Coincident cholecystitis would be significantly less likely.   Electronically Signed   By: Conchita Paris M.D.   On: 07/29/2014 13:14   US Venous Img Lower Unilateral Left  07/12/2014   CLINICAL DATA:  Left lower extremity pain, edema and weakness. Evaluate for DVT.  EXAM: LEFT LOWER EXTREMITY VENOUS DOPPLER ULTRASOUND  TECHNIQUE: Gray-scale sonography with graded compression, as well as color Doppler and duplex ultrasound were performed to evaluate the lower extremity deep venous systems from the level of the common femoral vein and including the common femoral, femoral, profunda femoral, popliteal and calf veins including the posterior tibial, peroneal and gastrocnemius veins when visible. The superficial great saphenous vein was also interrogated. Spectral Doppler was utilized to evaluate flow at rest and with distal augmentation maneuvers in the common femoral, femoral and popliteal veins.  COMPARISON:  None.  FINDINGS: Common Femoral Vein: No evidence of thrombus. Normal compressibility, respiratory phasicity and response to augmentation.  Saphenofemoral Junction: No evidence of thrombus. Normal compressibility and flow on color Doppler imaging.  Profunda Femoral Vein: No evidence of thrombus. Normal compressibility and flow on color Doppler imaging.  Femoral Vein: No evidence of thrombus. Normal compressibility, respiratory phasicity and response to augmentation.  Popliteal Vein: No evidence of thrombus. Normal compressibility, respiratory phasicity and response to augmentation.  Calf Veins: No evidence of thrombus. Normal compressibility and flow on color Doppler imaging.  Superficial Great Saphenous Vein: No evidence of thrombus. Normal compressibility and flow on color Doppler imaging.  Venous Reflux:  None.  Other Findings:  None.  IMPRESSION: No evidence of DVT within the left lower extremity.   Electronically Signed   By: Sandi Mariscal M.D.   On: 07/12/2014 16:56   Dg Chest Portable 1  View  07/29/2014   CLINICAL DATA:  Acute onset of chest pain.  EXAM: PORTABLE CHEST - 1 VIEW  COMPARISON:  Chest radiograph performed 07/12/2014  FINDINGS: The lungs are well-aerated. Vascular congestion is noted, without significant pulmonary edema. There is no evidence of focal opacification, pleural effusion or pneumothorax.  The cardiomediastinal silhouette is borderline enlarged. No acute osseous abnormalities are seen. Hardware is partially imaged at the right humeral head.  IMPRESSION: Vascular congestion and borderline cardiomegaly, without significant pulmonary edema.   Electronically Signed   By: Garald Balding M.D.   On: 07/29/2014 00:23   Mr Abd W/wo Cm/mrcp  07/29/2014   CLINICAL DATA:  Cirrhosis and portal hypertension. Reported diagnosis of pancreatitis  EXAM: MRI ABDOMEN WITHOUT AND WITH CONTRAST (INCLUDING MRCP)  TECHNIQUE: Multiplanar multisequence MR imaging of the abdomen was performed both before and after the administration of intravenous  contrast. Heavily T2-weighted images of the biliary and pancreatic ducts were obtained, and three-dimensional MRCP images were rendered by post processing.  CONTRAST:  33mL MULTIHANCE GADOBENATE DIMEGLUMINE 529 MG/ML IV SOLN  COMPARISON:  The most recent similar comparison exam is abdominal ultrasound 11/19/2013 and CT abdomen/ pelvis 04/26/2013. MRI abdomen 06/29/2011.  FINDINGS: Multiple series are degraded by patient motion. There is mild to moderate diffuse pancreatic edema and trace peripancreatic fluid. Nodular hepatic contour and splenomegaly reidentified compatible with previously diagnosed cirrhosis and evidence of portal hypertension. Trace pericholecystic fluid and or gallbladder wall thickening reidentified with dependent tiny stones noted. 1.3 cm cyst at the dome of the right hepatic lobe reidentified as well as T2 hyperintense hepatic lesions measuring less than 5 mm statistically most likely cysts or hamartomas.  Bilateral T2 hyperintense  renal cortical cysts are reidentified. 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 Adrenal glands are normal. No focal splenic lesion. No intrahepatic, common duct, or pancreatic ductal dilatation. No lymphadenopathy. Visualized bowel is unremarkable.  After administration of contrast, visualization is again suboptimal on multiple series because of patient respiratory motion. Allowing for this, no abnormal enhancement is seen. There is inhomogeneous hypoenhancement of the pancreas.  On heavily T2 weighted sequences, No common duct filling defect, focal cut off, or other abnormality is identified.  IMPRESSION: Findings compatible with mild-to-moderate pancreatitis without complicating features such as peripancreatic fluid collection to suggest pseudocyst formation.  No common duct filling defect or contour abnormality.  Gallstones with mild gallbladder wall thickening and/or pericholecystic fluid compatible with hypoproteinemia/changes of cirrhosis and portal hypertension. Coincident cholecystitis would be significantly less likely.   Electronically Signed   By: Conchita Paris M.D.   On: 07/29/2014 13:14    Microbiology: Recent Results (from the past 240 hour(s))  URINE CULTURE     Status: None   Collection Time    07/29/14  3:55 PM      Result Value Ref Range Status   Specimen Description URINE, CLEAN CATCH   Final   Special Requests NONE   Final   Culture  Setup Time     Final   Value: 07/30/2014 00:18     Performed at [place]     Final   Value: >=100,000 COLONIES/ML     Performed at [facility]   Culture     Final   Value: Multiple bacterial morphotypes present, none predominant. Suggest appropriate recollection if clinically indicated.     Performed at [facility]   Report Status 07/31/2014 FINAL   Final  MRSA PCR SCREENING     Status: None   Collection Time    07/30/14 11:45 AM      Result Value Ref Range Status   MRSA by PCR  NEGATIVE  NEGATIVE Final   Comment:            The GeneXpert MRSA Assay (FDA     approved for NASAL specimens     only), is one component of a     comprehensive MRSA colonization     surveillance program. It is not     intended to diagnose MRSA     infection nor to guide or     monitor treatment for     MRSA infections.     Labs: Basic Metabolic Panel:  Recent Labs Lab 07/28/14 2026 07/29/14 0558 07/30/14 0645 07/31/14 0504  NA 141 137 139 136*  K 3.9 4.5 4.4 3.7  CL 101  103 106 104  CO2 25 23 22 21   GLUCOSE 136* 315* 180* 190*  BUN 22 21 16 10   CREATININE 0.95 0.83 0.81 0.65  CALCIUM 9.7 8.7 8.3* 8.1*   Liver Function Tests:  Recent Labs Lab 07/28/14 2026 07/29/14 0558 07/30/14 0645 07/31/14 0504  AST 25 204* 71* 34  ALT 14 101* 66* 42*  ALKPHOS 99 142* 133* 113  BILITOT 1.7* 1.9* 1.7* 1.3*  PROT 7.0 6.0 5.9* 5.5*  ALBUMIN 3.8 3.2* 3.1* 2.8*    Recent Labs Lab 07/28/14 2026 07/29/14 0558 07/30/14 0645 07/31/14 0504  LIPASE 1952* 2626* 120* 34   No results found for this basename: AMMONIA,  in the last 168 hours CBC:  Recent Labs Lab 07/28/14 2026 07/30/14 0645 07/31/14 0504  WBC 9.0 3.6* 3.9*  NEUTROABS 6.5  --   --   HGB 13.6 11.7* 10.8*  HCT 40.2 35.2* 31.9*  MCV 76.4* 77.7* 76.5*  PLT 151 100* 99*   Cardiac Enzymes:  Recent Labs Lab 07/28/14 2340  TROPONINI <0.30   BNP: BNP (last 3 results) No results found for this basename: PROBNP,  in the last 8760 hours CBG:  Recent Labs Lab 07/30/14 2140 07/30/14 2353 07/31/14 0421 07/31/14 0734 07/31/14 1151  GLUCAP 202* 187* 171* 185* 251*       Signed:  Lanis Storlie  [facility] 07/31/2014, 3:22 PM

## 2014-07-31 NOTE — Telephone Encounter (Signed)
PT NEEDS/WOULD AN APPT(E30 RMR IN 3-4 WEEKS) TO DISCUSSED BENEFITS, RISKS, AND MANAGEMENT OF GALLSTONES TO INCLUDE URSO, ERCP/SPHINCTEROTOMY, OR Lenore Manner AT [place].

## 2014-07-31 NOTE — Progress Notes (Addendum)
Patient ID: NOVICE VRBA, female   DOB: 02-16-1941, 73 y.o.   MRN: [id number]   Assessment/Plan: ADMITTED WITH GALLSTONE PANCREATITIS. CLINICALLY IMPROVED.  PLAN: 1. LOW FAT/DIABETIC DIET 2. ADD URSO FORTE 500 MG BID AS OP. RX SENT TO [facility]. 3. DISCUSSED BENEFITS, RISKS, AND MANAGEMENT OF GALLSTONES TO INCLUDE URSO, ERCP/SPHINCTEROTOMY/SURGERY.   Subjective: Since I last evaluated the patient SHE DENIES NAUSEA/VOMITING,/EPIGASTRIC PAIN. TOLERATING POS.  Objective: Vital signs in last 24 hours: Filed Vitals:   07/31/14 0809  BP: 120/62  Pulse:   Temp:   Resp:      General appearance: alert, cooperative and no distress Resp: clear to auscultation bilaterally Cardio: regular rate and rhythm GI: soft, non-tender; bowel sounds normal;    Medications: I have reviewed the patient's current medications.   LOS: 5 days   Barney Drain 04/22/2014, 2:23 PM

## 2014-07-31 NOTE — Progress Notes (Signed)
PHARMACIST - PHYSICIAN COMMUNICATION DR:   Caryn Section CONCERNING: Antibiotic IV to Oral Route Change Policy  RECOMMENDATION: This patient is receiving Cipro by the intravenous route.  Based on criteria approved by the Pharmacy and Therapeutics Committee, the antibiotic(s) is/are being converted to the equivalent oral dose form(s).   DESCRIPTION: These criteria include:  Patient being treated for a respiratory tract infection, urinary tract infection, cellulitis or clostridium difficile associated diarrhea if on metronidazole  The patient is not neutropenic and does not exhibit a GI malabsorption state  The patient is eating (either orally or via tube) and/or has been taking other orally administered medications for a least 24 hours  The patient is improving clinically and has a Tmax < 100.5  If you have questions about this conversion, please contact the Pharmacy Department  [x]   [phone] )  [facility] []   [phone] )  [facility]  []   [phone] )  [facility] []   [phone] )  [place], PharmD, BCPS 07/31/2014@8 :58 AM

## 2014-08-01 ENCOUNTER — Encounter: Payer: Self-pay | Admitting: Internal Medicine

## 2014-08-01 DIAGNOSIS — R109 Unspecified abdominal pain: Secondary | ICD-10-CM | POA: Insufficient documentation

## 2014-08-01 NOTE — Progress Notes (Signed)
Patient ID: [name] [name], female   DOB: 10-08-1941, 73 y.o.   MRN: [id number]     FACILITY: [facility]  LEVEL OF CARE: SNF  This is an acute visit.   CHIEF COMPLAINT: Acute visit secondary to abdominal-back pain .  HISTORY OF PRESENT ILLNESS: This is a medically complex,  pleasant, 73 year-old woman who was in hospital from 06/26/2014 through 06/28/2014 with hepatic encephalopathy. She suffers from known liver cirrhosis secondary to NASH.  She was readmitted to hospital  with lower extremity weakness. . She attributed a lot of this to shoulder surgery she had, fracturing her right humerus, which looks to have been in April 2014. The patient was seen by Neurology. An MRI was done that did not show any acute findings. Ultimately, her weakness was felt to be generalized secondary to deconditioning. And she is here for rehabilitation.  Apparently later today patient developed significant abdominal pain she said radiated to her back.  Apparently there has been no vomiting but she has not really been able to eat back she says the pain has gradually gotten worse but has stabilized here the last couple of hours.  She does state she has a history of pancreatitis in this feels somewhat similar to the pain she had when she experienced this.  Her vital signs remained stable she does not complaining of any chest pain or shortness of breath headache dizziness or syncopal-type feelings-just says she has a fairly acute abdominal back discomfort.    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  Obesity.  Cirrhosis secondary to NASH.  Essential hypertension.  Left-sided weakness.  Peripheral neuropathy.  Intrinsic asthma.  Gastroesophageal reflux disease.  Anxiety.  Colonic polyps.  Hyperlipidemia.  CURRENT MEDICATIONS: Medication list is reviewed.  Xanax 1 mg three times a day p.r.n.  ASA 325 q.d.  Symbicort 2 puffs q.h.s.  Nexium 40 mg daily.    Estrace vaginal 1 application every other day at bedtime.  Lasix 20 q.d.  Levemir 60 at bedtime.  Lactulose 45 mL three times daily.  Antivert 25 mg three times daily p.r.n. for dizziness or nausea.  OxyContin 10 mg b.i.d. as needed for severe pain.  Lyrica 100 q.d.  Accupril 5 q.d.  Xifaxan 550 b.i.d.  Aldactone 50 q.d.  SOCIAL HISTORY:  HOUSING: The patient lives at home. Has two stairs.  FUNCTIONAL STATUS: Uses a walker at home, although she has mostly been bed- to wheelchair-bound .  REVIEW OF SYSTEMS:  Gen.-complains of midabdominal discomfort radiating some to her back Skin does not complain of diuresis HEENT: Does not complaining of any difficulty swallowing or dysphagia or visual changes .  CHEST/RESPIRATORY: No shortness of breath.  CARDIAC: No chest pain.  GI: Is complaining of abdominal pain as noted above also some nausea there has been no vomiting.  MUSCULOSKELETAL: Extremities: Says her legs are weak, but denies any sensory changes Neurologic-does not complaining of any headache or dizziness.  .  PHYSICAL EXAMINATION Temperature 98.4 pulse 84 respirations 18 blood pressure 134/69-O2 saturation 95% on room air:  GENERAL APPEARANCE: Very pleasant woman who appears weak and uncomfortable-touching her stomach.  HEENT:  EYES: No scleral icterus visual acuity appears grossly intact.  CHEST/RESPIRATORY: Clear air entry bilaterally. No labored breathing  CARDIOVASCULAR:  CARDIAC: Heart sounds are normal. There are no murmurs regular rate and rhythm with an occasional skipped beat.  GASTROINTESTINAL:  L somewhat obese it is soft there is diffuse tenderness to palpation cannot really  localize this-bowel sounds are active CIRCULATION: Extremities: No edema.  MUSCULOSKELETAL:  EXTREMITIES:    moves all extremities x4 his baseline with a history of right-sided weakness-she is able to sit up this did not really alleviate or aggravate the pain in her abdomen- that again  radiates to her back NEUROLOGICAL:   I did not appreciate any focal deficits her speech is clear no lateralizing findings.  Labs.  07/13/2014.  Sodium 140 potassium 3.9 BUN 16 creatinine 0.81.  WBC 3.5-hemoglobin 11.4-platelets 165.  Albumin 3.2 otherwise liver function tests within normal limits.    .  ASSESSMENT/PLAN:  Abdominal pain radiating to the back-she appears to be in  moderate distress -she should go to the ER for expedient evaluation-numerous etiologies with a history of pancreatitis certainly would be consideration-she does not appear to be in acute distress her vital signs are stable and she will need to go to the ER for expedient evaluation .  NASH. There is no evidence of hepatic encephalopathy at present. She is on both lactulose and rifaximin.  Asthma. She is on Symbicort. She appears to be stable from this regard  I did reevaluate the patient after reviewing her chart the abdominal pain does not appear to be progressing nonetheless it is significant and acute abdominal pain needs to be addressed expediently.-The patient remains stable but uncomfortable  [id number]    r.

## 2014-08-02 ENCOUNTER — Encounter: Payer: Self-pay | Admitting: Internal Medicine

## 2014-08-02 NOTE — Telephone Encounter (Signed)
APPT MADE, LETTER MAILED

## 2014-08-03 ENCOUNTER — Inpatient Hospital Stay (HOSPITAL_COMMUNITY)
Admission: EM | Admit: 2014-08-03 | Discharge: 2014-08-06 | DRG: 440 | Disposition: A | Discharge status: Home / Self Care | Payer: Medicare Other | Attending: Internal Medicine | Admitting: Internal Medicine

## 2014-08-03 ENCOUNTER — Encounter (HOSPITAL_COMMUNITY): Payer: Self-pay | Admitting: Emergency Medicine

## 2014-08-03 DIAGNOSIS — E119 Type 2 diabetes mellitus without complications: Secondary | ICD-10-CM | POA: Diagnosis present

## 2014-08-03 DIAGNOSIS — R0602 Shortness of breath: Secondary | ICD-10-CM

## 2014-08-03 DIAGNOSIS — K859 Acute pancreatitis without necrosis or infection, unspecified: Secondary | ICD-10-CM | POA: Diagnosis present

## 2014-08-03 DIAGNOSIS — Z903 Acquired absence of stomach [part of]: Secondary | ICD-10-CM | POA: Diagnosis not present

## 2014-08-03 DIAGNOSIS — E1142 Type 2 diabetes mellitus with diabetic polyneuropathy: Secondary | ICD-10-CM

## 2014-08-03 DIAGNOSIS — Z6835 Body mass index (BMI) 35.0-35.9, adult: Secondary | ICD-10-CM

## 2014-08-03 DIAGNOSIS — Z8249 Family history of ischemic heart disease and other diseases of the circulatory system: Secondary | ICD-10-CM

## 2014-08-03 DIAGNOSIS — N23 Unspecified renal colic: Secondary | ICD-10-CM

## 2014-08-03 DIAGNOSIS — R109 Unspecified abdominal pain: Secondary | ICD-10-CM

## 2014-08-03 DIAGNOSIS — K7460 Unspecified cirrhosis of liver: Secondary | ICD-10-CM

## 2014-08-03 DIAGNOSIS — K7689 Other specified diseases of liver: Secondary | ICD-10-CM

## 2014-08-03 DIAGNOSIS — E785 Hyperlipidemia, unspecified: Secondary | ICD-10-CM

## 2014-08-03 DIAGNOSIS — Z01818 Encounter for other preprocedural examination: Secondary | ICD-10-CM

## 2014-08-03 DIAGNOSIS — IMO0002 Reserved for concepts with insufficient information to code with codable children: Secondary | ICD-10-CM

## 2014-08-03 DIAGNOSIS — J309 Allergic rhinitis, unspecified: Secondary | ICD-10-CM

## 2014-08-03 DIAGNOSIS — K8020 Calculus of gallbladder without cholecystitis without obstruction: Secondary | ICD-10-CM

## 2014-08-03 DIAGNOSIS — E1165 Type 2 diabetes mellitus with hyperglycemia: Secondary | ICD-10-CM

## 2014-08-03 DIAGNOSIS — D649 Anemia, unspecified: Secondary | ICD-10-CM | POA: Diagnosis present

## 2014-08-03 DIAGNOSIS — E871 Hypo-osmolality and hyponatremia: Secondary | ICD-10-CM

## 2014-08-03 DIAGNOSIS — R531 Weakness: Secondary | ICD-10-CM

## 2014-08-03 DIAGNOSIS — D6959 Other secondary thrombocytopenia: Secondary | ICD-10-CM | POA: Diagnosis present

## 2014-08-03 DIAGNOSIS — K8070 Calculus of gallbladder and bile duct without cholecystitis without obstruction: Secondary | ICD-10-CM | POA: Diagnosis present

## 2014-08-03 DIAGNOSIS — E860 Dehydration: Secondary | ICD-10-CM

## 2014-08-03 DIAGNOSIS — R945 Abnormal results of liver function studies: Secondary | ICD-10-CM

## 2014-08-03 DIAGNOSIS — G609 Hereditary and idiopathic neuropathy, unspecified: Secondary | ICD-10-CM

## 2014-08-03 DIAGNOSIS — Z860100 Personal history of colon polyps, unspecified: Secondary | ICD-10-CM

## 2014-08-03 DIAGNOSIS — J45909 Unspecified asthma, uncomplicated: Secondary | ICD-10-CM

## 2014-08-03 DIAGNOSIS — R42 Dizziness and giddiness: Secondary | ICD-10-CM

## 2014-08-03 DIAGNOSIS — K7581 Nonalcoholic steatohepatitis (NASH): Secondary | ICD-10-CM

## 2014-08-03 DIAGNOSIS — E876 Hypokalemia: Secondary | ICD-10-CM | POA: Diagnosis present

## 2014-08-03 DIAGNOSIS — R7989 Other specified abnormal findings of blood chemistry: Secondary | ICD-10-CM

## 2014-08-03 DIAGNOSIS — K7290 Hepatic failure, unspecified without coma: Secondary | ICD-10-CM

## 2014-08-03 DIAGNOSIS — G9340 Encephalopathy, unspecified: Secondary | ICD-10-CM

## 2014-08-03 DIAGNOSIS — Z9181 History of falling: Secondary | ICD-10-CM

## 2014-08-03 DIAGNOSIS — R270 Ataxia, unspecified: Secondary | ICD-10-CM

## 2014-08-03 DIAGNOSIS — G9349 Other encephalopathy: Secondary | ICD-10-CM

## 2014-08-03 DIAGNOSIS — Z833 Family history of diabetes mellitus: Secondary | ICD-10-CM

## 2014-08-03 DIAGNOSIS — I10 Essential (primary) hypertension: Secondary | ICD-10-CM | POA: Diagnosis present

## 2014-08-03 DIAGNOSIS — K7682 Hepatic encephalopathy: Secondary | ICD-10-CM

## 2014-08-03 DIAGNOSIS — R2689 Other abnormalities of gait and mobility: Secondary | ICD-10-CM

## 2014-08-03 DIAGNOSIS — R600 Localized edema: Secondary | ICD-10-CM

## 2014-08-03 DIAGNOSIS — N6099 Unspecified benign mammary dysplasia of unspecified breast: Secondary | ICD-10-CM

## 2014-08-03 DIAGNOSIS — D72819 Decreased white blood cell count, unspecified: Secondary | ICD-10-CM | POA: Diagnosis present

## 2014-08-03 DIAGNOSIS — Z8659 Personal history of other mental and behavioral disorders: Secondary | ICD-10-CM

## 2014-08-03 DIAGNOSIS — I2510 Atherosclerotic heart disease of native coronary artery without angina pectoris: Secondary | ICD-10-CM | POA: Diagnosis present

## 2014-08-03 DIAGNOSIS — R29898 Other symptoms and signs involving the musculoskeletal system: Secondary | ICD-10-CM

## 2014-08-03 DIAGNOSIS — D696 Thrombocytopenia, unspecified: Secondary | ICD-10-CM

## 2014-08-03 DIAGNOSIS — R410 Disorientation, unspecified: Secondary | ICD-10-CM

## 2014-08-03 DIAGNOSIS — D61818 Other pancytopenia: Secondary | ICD-10-CM

## 2014-08-03 DIAGNOSIS — K219 Gastro-esophageal reflux disease without esophagitis: Secondary | ICD-10-CM | POA: Diagnosis present

## 2014-08-03 DIAGNOSIS — K851 Biliary acute pancreatitis without necrosis or infection: Secondary | ICD-10-CM

## 2014-08-03 DIAGNOSIS — E669 Obesity, unspecified: Secondary | ICD-10-CM | POA: Diagnosis present

## 2014-08-03 DIAGNOSIS — M76829 Posterior tibial tendinitis, unspecified leg: Secondary | ICD-10-CM

## 2014-08-03 DIAGNOSIS — G629 Polyneuropathy, unspecified: Secondary | ICD-10-CM

## 2014-08-03 DIAGNOSIS — R296 Repeated falls: Secondary | ICD-10-CM

## 2014-08-03 DIAGNOSIS — R262 Difficulty in walking, not elsewhere classified: Secondary | ICD-10-CM

## 2014-08-03 LAB — CBC WITH DIFFERENTIAL/PLATELET
BASOS PCT: 0 % (ref 0–1)
Basophils Absolute: 0 10*3/uL (ref 0.0–0.1)
EOS PCT: 1 % (ref 0–5)
Eosinophils Absolute: 0 10*3/uL (ref 0.0–0.7)
HCT: 37.4 % (ref 36.0–46.0)
Hemoglobin: 12.6 g/dL (ref 12.0–15.0)
LYMPHS ABS: 0.6 10*3/uL — AB (ref 0.7–4.0)
Lymphocytes Relative: 8 % — ABNORMAL LOW (ref 12–46)
MCH: 25.9 pg — ABNORMAL LOW (ref 26.0–34.0)
MCHC: 33.7 g/dL (ref 30.0–36.0)
MCV: 77 fL — AB (ref 78.0–100.0)
MONOS PCT: 9 % (ref 3–12)
Monocytes Absolute: 0.7 10*3/uL (ref 0.1–1.0)
Neutro Abs: 6.4 10*3/uL (ref 1.7–7.7)
Neutrophils Relative %: 82 % — ABNORMAL HIGH (ref 43–77)
Platelets: 140 10*3/uL — ABNORMAL LOW (ref 150–400)
RBC: 4.86 MIL/uL (ref 3.87–5.11)
RDW: 14.6 % (ref 11.5–15.5)
WBC: 7.8 10*3/uL (ref 4.0–10.5)

## 2014-08-03 LAB — COMPREHENSIVE METABOLIC PANEL
ALT: 22 U/L (ref 0–35)
ANION GAP: 12 (ref 5–15)
AST: 27 U/L (ref 0–37)
Albumin: 3.2 g/dL — ABNORMAL LOW (ref 3.5–5.2)
Alkaline Phosphatase: 128 U/L — ABNORMAL HIGH (ref 39–117)
BUN: 11 mg/dL (ref 6–23)
CO2: 25 mEq/L (ref 19–32)
Calcium: 8.7 mg/dL (ref 8.4–10.5)
Chloride: 102 mEq/L (ref 96–112)
Creatinine, Ser: 0.75 mg/dL (ref 0.50–1.10)
GFR calc non Af Amer: 83 mL/min — ABNORMAL LOW (ref >90)
GLUCOSE: 199 mg/dL — AB (ref 70–99)
Potassium: 3.9 mEq/L (ref 3.7–5.3)
Sodium: 139 mEq/L (ref 137–147)
TOTAL PROTEIN: 6.3 g/dL (ref 6.0–8.3)
Total Bilirubin: 2.3 mg/dL — ABNORMAL HIGH (ref 0.3–1.2)

## 2014-08-03 LAB — I-STAT CG4 LACTIC ACID, ED: LACTIC ACID, VENOUS: 2.13 mmol/L (ref 0.5–2.2)

## 2014-08-03 LAB — GLUCOSE, CAPILLARY: Glucose-Capillary: 183 mg/dL — ABNORMAL HIGH (ref 70–99)

## 2014-08-03 LAB — LIPASE, BLOOD: Lipase: 2576 U/L — ABNORMAL HIGH (ref 11–59)

## 2014-08-03 LAB — CBG MONITORING, ED: GLUCOSE-CAPILLARY: 165 mg/dL — AB (ref 70–99)

## 2014-08-03 MED ORDER — INSULIN ASPART 100 UNIT/ML ~~LOC~~ SOLN: 0.0000 [IU] | Freq: Every day | SUBCUTANEOUS | Status: DC

## 2014-08-03 MED ORDER — ONDANSETRON HCL 4 MG/2ML IJ SOLN
4.0000 mg | Freq: Once | INTRAMUSCULAR | Status: AC
  Administered 2014-08-03: 4 mg via INTRAVENOUS
  Filled 2014-08-03: qty 2

## 2014-08-03 MED ORDER — INSULIN ASPART 100 UNIT/ML ~~LOC~~ SOLN
0.0000 [IU] | Freq: Four times a day (QID) | SUBCUTANEOUS | Status: DC
  Administered 2014-08-03: 4 [IU] via SUBCUTANEOUS
  Administered 2014-08-04: 3 [IU] via SUBCUTANEOUS
  Administered 2014-08-04: 4 [IU] via SUBCUTANEOUS
  Administered 2014-08-04 (×2): 3 [IU] via SUBCUTANEOUS
  Administered 2014-08-05: 4 [IU] via SUBCUTANEOUS
  Administered 2014-08-05: 3 [IU] via SUBCUTANEOUS
  Administered 2014-08-05: 7 [IU] via SUBCUTANEOUS
  Filled 2014-08-03: qty 1

## 2014-08-03 MED ORDER — SODIUM CHLORIDE 0.9 % IV SOLN
INTRAVENOUS | Status: DC
  Administered 2014-08-03 – 2014-08-06 (×6): via INTRAVENOUS

## 2014-08-03 MED ORDER — ONDANSETRON HCL 4 MG PO TABS: 4.0000 mg | ORAL_TABLET | Freq: Four times a day (QID) | ORAL | Status: DC | PRN

## 2014-08-03 MED ORDER — ONDANSETRON HCL 4 MG/2ML IJ SOLN
4.0000 mg | Freq: Four times a day (QID) | INTRAMUSCULAR | Status: DC | PRN
  Administered 2014-08-04 – 2014-08-06 (×5): 4 mg via INTRAVENOUS
  Filled 2014-08-03 (×5): qty 2

## 2014-08-03 MED ORDER — HYDROMORPHONE HCL 1 MG/ML IJ SOLN
1.0000 mg | INTRAMUSCULAR | Status: AC | PRN
  Administered 2014-08-03: 1 mg via INTRAVENOUS
  Filled 2014-08-03: qty 1

## 2014-08-03 MED ORDER — SODIUM CHLORIDE 0.9 % IV BOLUS (SEPSIS)
1000.0000 mL | Freq: Once | INTRAVENOUS | Status: AC
  Administered 2014-08-03: 1000 mL via INTRAVENOUS

## 2014-08-03 MED ORDER — ONDANSETRON HCL 4 MG/2ML IJ SOLN: 4.0000 mg | Freq: Three times a day (TID) | INTRAMUSCULAR | Status: DC | PRN

## 2014-08-03 MED ORDER — HYDROMORPHONE HCL 1 MG/ML IJ SOLN
1.0000 mg | Freq: Once | INTRAMUSCULAR | Status: AC
  Administered 2014-08-03: 1 mg via INTRAVENOUS
  Filled 2014-08-03: qty 1

## 2014-08-03 MED ORDER — HEPARIN SODIUM (PORCINE) 5000 UNIT/ML IJ SOLN
5000.0000 [IU] | Freq: Three times a day (TID) | INTRAMUSCULAR | Status: DC
  Administered 2014-08-03 – 2014-08-06 (×9): 5000 [IU] via SUBCUTANEOUS
  Filled 2014-08-03 (×9): qty 1

## 2014-08-03 MED ORDER — SODIUM CHLORIDE 0.9 % IV SOLN: INTRAVENOUS | Status: DC

## 2014-08-03 NOTE — ED Notes (Addendum)
Patient complaining of abdominal pain starting today. Also complaining of nausea. Husband states "she was just discharged from here Saturday for pancreatitis. They said she has gallstones also but they can't do surgery on her." Patient states she took oxycontin one hour prior to arrival to ED.

## 2014-08-03 NOTE — ED Provider Notes (Signed)
CSN: [id number]     Arrival date & time 08/03/14  1539 History   First MD Initiated Contact with Patient 08/03/14 1623     Chief Complaint  Patient presents with  . Abdominal Pain     (Consider location/radiation/quality/duration/timing/severity/associated sxs/prior Treatment) HPI Comments: Patient presents with upper abdominal pain with constant nausea that onset "a while ago". She was discharged from the hospital 3 days ago for similar symptoms after being treated for pancreatitis and gallstones. She was told she was not a surgical candidate. She denies any fever, chest pain, shortness of breath or urinary symptoms. Pain is similar to her previous exacerbations of pancreatitis but worse. She took OxyContin without relief.  The history is provided by the patient.    Past Medical History  Diagnosis Date  . CAD in native artery   . Thrombocytopenia, unspecified   . Unspecified hereditary and idiopathic peripheral neuropathy   . Unspecified fall   . Other and unspecified hyperlipidemia   . Unspecified essential hypertension   . Obesity, unspecified   . Personal history of neurosis   . Intrinsic asthma, unspecified   . Allergic rhinitis, cause unspecified   . Cirrhosis of liver     NASH, afp on 06/17/12 =3.7, per pt she had hep B vaccines in 1996, hep A in process.   . Colon adenomas 03/08/10    tcs by Dr. Gala Romney  . Hyperplastic polyps of stomach 03/08/10    tcs by Dr. Dudley Major  . Chronic gastritis 03/09/11    egd by Dr. Gala Romney  . History of hemorrhoids 03/08/10    tcs- internal and external  . Diverticula of colon 03/08/10    L side  . Esophagitis, erosive 03/08/10  . GERD (gastroesophageal reflux disease)   . Wears glasses   . Type II or unspecified type diabetes mellitus without mention of complication, not stated as uncontrolled   . Vertigo   . Pancreatitis   . Bilateral leg weakness   . Thrombocytopenia 04/12/2014  . Pancytopenia, acquired 04/12/2014  . Cirrhosis   . Abnormal EKG     hx of left anterior fasicular block on 04-09-13 ekg epic   Past Surgical History  Procedure Laterality Date  . Hysterectomy and btl      s/p  . Tumor excision  2003    rt arm and left foot  . Colonoscopy  03/08/10    Dr. [name]-->ext/int hemorrhoids, anal paipilla, rectal polyps, desc polyps, cecal polyp, left-sided diverticula. suboptiomal prep. next TCS 02/2015. multiple adenomas  . Esophagogastroduodenoscopy  03/08/10    Dr. [name] erosive RE, small hh, antral erosions, small 1cm are of mucosal indentation along gastric body of doubtful significance, cystic nodularity of hypophyarynx, base of tongue, base of epiglottis. benign gastric biopsies  . Tubal ligation    . Esophagogastroduodenoscopy  10/08/2011    Dr. [name] esophageal varices, antral erosion. next egd 09/2013  . Foot surgery  2003    lt foot  . Eye surgery      cataracts bilateral  . Orif humerus fracture Right 02/17/2013    Procedure: OPEN REDUCTION INTERNAL FIXATION (ORIF) RIGHT PROXIMAL HUMERUS FRACTURE;  Surgeon: [name], MD;  Location: [facility];  Service: Orthopedics;  Laterality: Right;  . Partial gastrectomy      family denies  . Esophagogastroduodenoscopy N/A 02/17/2014    Dr. Gala Romney: portal gastropathy, no varices. Due for surveillance 2017  . Breast surgery Right few yrs ago    benign area removed  . Abdominal hysterectomy    .  Cardiac catheterization  02/25/2007    Dr. [name]:  normal LV systolic function, mild irregularities of LAD   Family History  Problem Relation Age of Onset  . Coronary artery disease      FH  . Diabetes      FH  . Heart disease Mother   . Colon cancer Neg Hx    History  Substance Use Topics  . Smoking status: Never Smoker   . Smokeless tobacco: Never Used  . Alcohol Use: No   OB History   Grav Para Term Preterm Abortions TAB SAB Ect Mult Living                 Review of Systems  Constitutional: Positive for activity change, appetite change and fatigue. Negative for  fever.  HENT: Negative for congestion and rhinorrhea.   Respiratory: Negative for cough, chest tightness and shortness of breath.   Cardiovascular: Negative for chest pain.  Gastrointestinal: Positive for nausea and abdominal pain. Negative for vomiting and diarrhea.  Genitourinary: Negative for dysuria and hematuria.  Musculoskeletal: Negative for arthralgias and myalgias.  Skin: Negative for rash.  Neurological: Negative for dizziness, weakness and numbness.  A complete 10 system review of systems was obtained and all systems are negative except as noted in the HPI and PMH.      Allergies  Morphine and related; Prochlorperazine edisylate; and Compazine  Home Medications   Prior to Admission medications   Medication Sig Start Date End Date Taking? Authorizing Provider  aspirin EC 325 MG EC tablet Take 1 tablet (325 mg total) by mouth every morning. 07/14/14  Yes Nita Sells, MD  ALPRAZolam Duanne Moron) 0.5 MG tablet Take one tablet by mouth three times daily as needed for anxiety 07/15/14   Tiffany L Reed, DO  budesonide-formoterol (SYMBICORT) 160-4.5 MCG/ACT inhaler Inhale 2 puffs into the lungs at bedtime.     Historical Provider, MD  ciprofloxacin (CIPRO) 500 MG tablet Take 1 tablet (500 mg total) by mouth 2 (two) times daily. Antibiotic to be taken for 5 more days 07/31/14   Rexene Alberts, MD  ESTRACE VAGINAL 0.1 MG/GM vaginal cream Place 1 Applicatorful vaginally every other day. At  bedtime 08/11/13   Historical Provider, MD  furosemide (LASIX) 20 MG tablet Take 20 mg by mouth every morning.    Historical Provider, MD  insulin aspart (NOVOLOG FLEXPEN) 100 UNIT/ML FlexPen Inject 2-10 Units into the skin 3 (three) times daily with meals. Sliding Scale: 200-250= 2 units 251-300= 4 units 301-350= 6 units 351-400= 8 units If levels are over 400, GIVE 10 UNITS AND CALL MD    Historical Provider, MD  insulin detemir (LEVEMIR) 100 UNIT/ML injection Inject 60 Units into the skin at  bedtime.     Historical Provider, MD  lactulose, encephalopathy, (GENERLAC) 10 GM/15ML SOLN Take 45 mLs (30 g total) by mouth 3 (three) times daily. 06/28/14   Kathie Dike, MD  meclizine (ANTIVERT) 25 MG tablet Take 25 mg by mouth 3 (three) times daily as needed for dizziness or nausea.     Historical Provider, MD  Multiple Vitamins-Minerals (CENTRUM SILVER PO) Take 1 tablet by mouth daily.     Historical Provider, MD  NEXIUM 40 MG capsule Take 40 mg by mouth every morning.  06/17/14   Historical Provider, MD  OxyCODONE (OXYCONTIN) 10 mg T12A 12 hr tablet Take 1 tablet (10 mg total) by mouth 2 (two) times daily as needed (severe pain). 07/14/14   Nita Sells, MD  pregabalin (LYRICA)  100 MG capsule Take one capsule by mouth once daily for pains 07/15/14   Tiffany L Reed, DO  quinapril (ACCUPRIL) 5 MG tablet Take 5 mg by mouth at bedtime.    Historical Provider, MD  rifaximin (XIFAXAN) 550 MG TABS tablet Take 1 tablet (550 mg total) by mouth 2 (two) times daily. 06/28/14   Kathie Dike, MD  spironolactone (ALDACTONE) 50 MG tablet Take 50 mg by mouth every morning.    Historical Provider, MD  triamcinolone cream (KENALOG) 0.1 % Apply 1 application topically daily as needed (for irritation).  06/26/13   Historical Provider, MD  ursodiol (URSO FORTE) 500 MG tablet 1 PO BID 07/31/14   Danie Binder, MD   BP 99/80  Pulse 60  Temp(Src) 98.6 F (37 C) (Oral)  Resp 16  Ht 5\' 8"  (1.727 m)  Wt 228 lb (103.42 kg)  BMI 34.68 kg/m2  SpO2 98% Physical Exam  Nursing note and vitals reviewed. Constitutional: She is oriented to person, place, and time. She appears well-developed and well-nourished. No distress.  HENT:  Head: Normocephalic and atraumatic.  Mouth/Throat: Oropharynx is clear and moist. No oropharyngeal exudate.  Eyes: Conjunctivae and EOM are normal. Pupils are equal, round, and reactive to light.  Neck: Normal range of motion. Neck supple.  No meningismus.  Cardiovascular: Normal rate,  regular rhythm, normal heart sounds and intact distal pulses.   No murmur heard. Pulmonary/Chest: Effort normal and breath sounds normal. No respiratory distress.  Abdominal: Soft. There is tenderness. There is guarding. There is no rebound.  TTP epigastrium and RUQ with guarding.  Musculoskeletal: Normal range of motion. She exhibits no edema and no tenderness.  Neurological: She is alert and oriented to person, place, and time. No cranial nerve deficit. She exhibits normal muscle tone. Coordination normal.  No ataxia on finger to nose bilaterally. No pronator drift. 5/5 strength throughout. CN 2-12 intact. Negative Romberg. Equal grip strength. Sensation intact. Gait is normal.   Skin: Skin is warm.  Psychiatric: She has a normal mood and affect. Her behavior is normal.    ED Course  Procedures (including critical care time) Labs Review Labs Reviewed  CBC WITH DIFFERENTIAL - Abnormal; Notable for the following:    MCV 77.0 (*)    MCH 25.9 (*)    Platelets 140 (*)    Neutrophils Relative % 82 (*)    Lymphocytes Relative 8 (*)    Lymphs Abs 0.6 (*)    All other components within normal limits  COMPREHENSIVE METABOLIC PANEL - Abnormal; Notable for the following:    Glucose, Bld 199 (*)    Albumin 3.2 (*)    Alkaline Phosphatase 128 (*)    Total Bilirubin 2.3 (*)    GFR calc non Af Amer 83 (*)    All other components within normal limits  LIPASE, BLOOD - Abnormal; Notable for the following:    Lipase 2576 (*)    All other components within normal limits  GLUCOSE, CAPILLARY - Abnormal; Notable for the following:    Glucose-Capillary 183 (*)    All other components within normal limits  CBG MONITORING, ED - Abnormal; Notable for the following:    Glucose-Capillary 165 (*)    All other components within normal limits  URINALYSIS, ROUTINE W REFLEX MICROSCOPIC  COMPREHENSIVE METABOLIC PANEL  CBC  PROTIME-INR  I-STAT CG4 LACTIC ACID, ED    Imaging Review No results found.    EKG Interpretation None      MDM  Final diagnoses:  Acute pancreatitis, unspecified pancreatitis type   upper abdominal pain with nausea similar to previous pancreatitis type pain.  Recent admission for same. Lipase 2500. LFTs normal with exception of bilirubin 2.3. Afebrile.  Patient abdomen is soft without peritoneal signs. No fever.  Doubt cholangitis. Recent MRCP reviewed. She has known gallstones, but there was no evidence of common bile duct blockage.  She likely passed a gallstone again tonight.  Will admit again for IV pain and nausea control and further management. Discussed with Dr. Anastasio Champion.   BP 99/80  Pulse 60  Temp(Src) 98.6 F (37 C) (Oral)  Resp 16  Ht 5\' 8"  (1.727 m)  Wt 228 lb (103.42 kg)  BMI 34.68 kg/m2  SpO2 98%    Ezequiel Essex, MD 08/03/14 2312

## 2014-08-03 NOTE — H&P (Signed)
[facility] History and Physical  TENEKA MALMBERG JKK:[id number] DOB: [birth year] DOA: 08/03/2014  Referring physician: ER PCP: Purvis Kilts, MD   Chief Complaint: Abdominal pain, nausea and vomiting.  HPI: [name] [name] is a 73 y.o. female  This 73 year old lady, who is well-known to the hospital, presents once again with abdominal pain, nausea and vomiting. She was just discharged 3 days ago having been admitted for acute gallstone pancreatitis. The plan was for her to followup as an outpatient with gastroenterology and possibly go to a tertiary center such as [facility]. He was not felt that she was a good candidate for surgery and options included ERCP with sphincterotomy or surgery a tertiary care center. 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 She is now being admitted because she once again has acute pancreatitis based on symptoms and lipase which is significantly elevated.   Review of Systems:  Constitutional:  No weight loss, night sweats, Fevers, chills, fatigue.  HEENT:  No headaches, Difficulty swallowing,Tooth/dental problems,Sore throat,  No sneezing, itching, ear ache, nasal congestion, post nasal drip,  Cardio-vascular:  No chest pain, Orthopnea, PND, swelling in lower extremities, anasarca, dizziness, palpitations    Resp:  No shortness of breath with exertion or at rest. No excess mucus, no productive cough, No non-productive cough, No coughing up of blood.No change in color of mucus.No wheezing.No chest wall deformity  Skin:  no rash or lesions.  GU:  no dysuria, change in color of urine, no urgency or frequency. No flank pain.  Musculoskeletal:  No joint pain or swelling. No decreased range of motion. No back pain.  Psych:  No change in mood or affect. No depression or anxiety. No memory loss.   Past Medical History  Diagnosis Date  . CAD in native artery   . Thrombocytopenia, unspecified   . Unspecified hereditary and idiopathic  peripheral neuropathy   . Unspecified fall   . Other and unspecified hyperlipidemia   . Unspecified essential hypertension   . Obesity, unspecified   . Personal history of neurosis   . Intrinsic asthma, unspecified   . Allergic rhinitis, cause unspecified   . Cirrhosis of liver     NASH, afp on 06/17/12 =3.7, per pt she had hep B vaccines in 1996, hep A in process.   . Colon adenomas 03/08/10    tcs by Dr. Gala Romney  . Hyperplastic polyps of stomach 03/08/10    tcs by Dr. Dudley Major  . Chronic gastritis 03/09/11    egd by Dr. Gala Romney  . History of hemorrhoids 03/08/10    tcs- internal and external  . Diverticula of colon 03/08/10    L side  . Esophagitis, erosive 03/08/10  . GERD (gastroesophageal reflux disease)   . Wears glasses   . Type II or unspecified type diabetes mellitus without mention of complication, not stated as uncontrolled   . Vertigo   . Pancreatitis   . Bilateral leg weakness   . Thrombocytopenia 04/12/2014  . Pancytopenia, acquired 04/12/2014  . Cirrhosis   . Abnormal EKG     hx of left anterior fasicular block on 04-09-13 ekg epic   Past Surgical History  Procedure Laterality Date  . Hysterectomy and btl      s/p  . Tumor excision  2003    rt arm and left foot  . Colonoscopy  03/08/10    Dr. [name]-->ext/int hemorrhoids, anal paipilla, rectal polyps, desc polyps, cecal polyp, left-sided diverticula. suboptiomal prep. next TCS 02/2015. multiple adenomas  .  Esophagogastroduodenoscopy  03/08/10    Dr. [name] erosive RE, small hh, antral erosions, small 1cm are of mucosal indentation along gastric body of doubtful significance, cystic nodularity of hypophyarynx, base of tongue, base of epiglottis. benign gastric biopsies  . Tubal ligation    . Esophagogastroduodenoscopy  10/08/2011    Dr. [name] esophageal varices, antral erosion. next egd 09/2013  . Foot surgery  2003    lt foot  . Eye surgery      cataracts bilateral  . Orif humerus fracture Right 02/17/2013     Procedure: OPEN REDUCTION INTERNAL FIXATION (ORIF) RIGHT PROXIMAL HUMERUS FRACTURE;  Surgeon: [name], MD;  Location: [place] [facility];  Service: Orthopedics;  Laterality: Right;  . Partial gastrectomy      family denies  . Esophagogastroduodenoscopy N/A 02/17/2014    Dr. Gala Romney: portal gastropathy, no varices. Due for surveillance 2017  . Breast surgery Right few yrs ago    benign area removed  . Abdominal hysterectomy    . Cardiac catheterization  02/25/2007    Dr. [name]:  normal LV systolic function, mild irregularities of LAD   Social History:  reports that she has never smoked. She has never used smokeless tobacco. She reports that she does not drink alcohol or use illicit drugs.  Allergies  Allergen Reactions  . Morphine And Related Itching  . Prochlorperazine Edisylate Other (See Comments)    Causes anxiety/numbness, pt not sure of this allergy  . Compazine [Prochlorperazine Edisylate] Anxiety    Causes anxiety & nervousness    Family History  Problem Relation Age of Onset  . Coronary artery disease      FH  . Diabetes      FH  . Heart disease Mother   . Colon cancer Neg Hx      Prior to Admission medications   Medication Sig Start Date End Date Taking? Authorizing Provider  ALPRAZolam Duanne Moron) 0.5 MG tablet Take one tablet by mouth three times daily as needed for anxiety 07/15/14   Tiffany L Reed, DO  aspirin EC 325 MG EC tablet Take 1 tablet (325 mg total) by mouth every morning. 07/14/14   Nita Sells, MD  budesonide-formoterol (SYMBICORT) 160-4.5 MCG/ACT inhaler Inhale 2 puffs into the lungs at bedtime.     Historical Provider, MD  ciprofloxacin (CIPRO) 500 MG tablet Take 1 tablet (500 mg total) by mouth 2 (two) times daily. Antibiotic to be taken for 5 more days 07/31/14   Rexene Alberts, MD  ESTRACE VAGINAL 0.1 MG/GM vaginal cream Place 1 Applicatorful vaginally every other day. At  bedtime 08/11/13   Historical Provider, MD  furosemide (LASIX) 20 MG tablet Take 20 mg by  mouth every morning.    Historical Provider, MD  insulin aspart (NOVOLOG FLEXPEN) 100 UNIT/ML FlexPen Inject 2-10 Units into the skin 3 (three) times daily with meals. Sliding Scale: 200-250= 2 units 251-300= 4 units 301-350= 6 units 351-400= 8 units If levels are over 400, GIVE 10 UNITS AND CALL MD    Historical Provider, MD  insulin detemir (LEVEMIR) 100 UNIT/ML injection Inject 60 Units into the skin at bedtime.     Historical Provider, MD  lactulose, encephalopathy, (GENERLAC) 10 GM/15ML SOLN Take 45 mLs (30 g total) by mouth 3 (three) times daily. 06/28/14   Kathie Dike, MD  meclizine (ANTIVERT) 25 MG tablet Take 25 mg by mouth 3 (three) times daily as needed for dizziness or nausea.     Historical Provider, MD  Multiple Vitamins-Minerals (CENTRUM SILVER PO)  Take 1 tablet by mouth daily.     Historical Provider, MD  NEXIUM 40 MG capsule Take 40 mg by mouth every morning.  06/17/14   Historical Provider, MD  OxyCODONE (OXYCONTIN) 10 mg T12A 12 hr tablet Take 1 tablet (10 mg total) by mouth 2 (two) times daily as needed (severe pain). 07/14/14   Nita Sells, MD  pregabalin (LYRICA) 100 MG capsule Take one capsule by mouth once daily for pains 07/15/14   Tiffany L Reed, DO  quinapril (ACCUPRIL) 5 MG tablet Take 5 mg by mouth at bedtime.    Historical Provider, MD  rifaximin (XIFAXAN) 550 MG TABS tablet Take 1 tablet (550 mg total) by mouth 2 (two) times daily. 06/28/14   Kathie Dike, MD  spironolactone (ALDACTONE) 50 MG tablet Take 50 mg by mouth every morning.    Historical Provider, MD  triamcinolone cream (KENALOG) 0.1 % Apply 1 application topically daily as needed (for irritation).  06/26/13   Historical Provider, MD  ursodiol (URSO FORTE) 500 MG tablet 1 PO BID 07/31/14   Danie Binder, MD   Physical Exam: Filed Vitals:   08/03/14 1548 08/03/14 1700 08/03/14 1838  BP: 104/49 122/58 100/54  Pulse: 71  72  Temp: 99.3 F (37.4 C)    TempSrc: Oral    Resp: 18 17 18   Height: 5'  8" (1.727 m)    Weight: 104.327 kg (230 lb)    SpO2: 99%  95%    Wt Readings from Last 3 Encounters:  08/03/14 104.327 kg (230 lb)  07/29/14 104.4 kg (230 lb 2.6 oz)  07/12/14 104.4 kg (230 lb 2.6 oz)    General:  Appears calm and comfortable. Blood pressure is soft but she does not look septic or toxic. Her peripheries are well perfused. Eyes: PERRL, normal lids, irises & conjunctiva ENT: grossly normal hearing, lips & tongue Neck: no LAD, masses or thyromegaly Cardiovascular: RRR, no m/r/g. No LE edema. Telemetry: SR, no arrhythmias  Respiratory: CTA bilaterally, no w/r/r. Normal respiratory effort. Abdomen: soft, ntnd Skin: no rash or induration seen on limited exam Musculoskeletal: grossly normal tone BUE/BLE Psychiatric: grossly normal mood and affect, speech fluent and appropriate Neurologic: grossly non-focal.          Labs on Admission:  Basic Metabolic Panel:  Recent Labs Lab 07/28/14 2026 07/29/14 0558 07/30/14 0645 07/31/14 0504 08/03/14 1648  NA 141 137 139 136* 139  K 3.9 4.5 4.4 3.7 3.9  CL 101 103 106 104 102  CO2 25 23 22 21 25   GLUCOSE 136* 315* 180* 190* 199*  BUN 22 21 16 10 11   CREATININE 0.95 0.83 0.81 0.65 0.75  CALCIUM 9.7 8.7 8.3* 8.1* 8.7   Liver Function Tests:  Recent Labs Lab 07/28/14 2026 07/29/14 0558 07/30/14 0645 07/31/14 0504 08/03/14 1648  AST 25 204* 71* 34 27  ALT 14 101* 66* 42* 22  ALKPHOS 99 142* 133* 113 128*  BILITOT 1.7* 1.9* 1.7* 1.3* 2.3*  PROT 7.0 6.0 5.9* 5.5* 6.3  ALBUMIN 3.8 3.2* 3.1* 2.8* 3.2*    Recent Labs Lab 07/28/14 2026 07/29/14 0558 07/30/14 0645 07/31/14 0504 08/03/14 1648  LIPASE 1952* 2626* 120* 34 2576*   No results found for this basename: AMMONIA,  in the last 168 hours CBC:  Recent Labs Lab 07/28/14 2026 07/30/14 0645 07/31/14 0504 08/03/14 1648  WBC 9.0 3.6* 3.9* 7.8  NEUTROABS 6.5  --   --  6.4  HGB 13.6 11.7* 10.8* 12.6  HCT  40.2 35.2* 31.9* 37.4  MCV 76.4* 77.7* 76.5*  77.0*  PLT 151 100* 99* 140*   Cardiac Enzymes:  Recent Labs Lab 07/28/14 2340  TROPONINI <0.30    BNP (last 3 results) No results found for this basename: PROBNP,  in the last 8760 hours CBG:  Recent Labs Lab 07/30/14 2140 07/30/14 2353 07/31/14 0421 07/31/14 0734 07/31/14 1151  GLUCAP 202* 187* 171* 185* 251*    Radiological Exams on Admission: No results found.    Assessment/Plan   1. Recurrent acute gallstone pancreatitis. 2. Cirrhosis secondary to [place]. 3. Diabetes. 4. Hypertension, currently hypotensive to some degree. 5. Obesity.  Plan: 1. Admit. 2. Intravenous fluids at high flow rate. 2. N.p.o. 4. Analgesia and antiemetics as required. 5. Gastroenterology consultation. 6. Monitor and control diabetes.  Further recommendations will depend on patient's hospital progress.   Code Status: Full code.  DVT Prophylaxis: Heparin.  Family Communication: I discussed the plan with the patient at the bedside.   Disposition Plan: Home when medically stable.   Time spent: 60 minutes.  Doree Albee [facility] Pager [phone].

## 2014-08-04 DIAGNOSIS — R109 Unspecified abdominal pain: Secondary | ICD-10-CM

## 2014-08-04 DIAGNOSIS — D696 Thrombocytopenia, unspecified: Secondary | ICD-10-CM

## 2014-08-04 LAB — COMPREHENSIVE METABOLIC PANEL
ALT: 21 U/L (ref 0–35)
AST: 26 U/L (ref 0–37)
Albumin: 2.8 g/dL — ABNORMAL LOW (ref 3.5–5.2)
Alkaline Phosphatase: 118 U/L — ABNORMAL HIGH (ref 39–117)
Anion gap: 11 (ref 5–15)
BUN: 12 mg/dL (ref 6–23)
CO2: 25 mEq/L (ref 19–32)
Calcium: 8 mg/dL — ABNORMAL LOW (ref 8.4–10.5)
Chloride: 106 mEq/L (ref 96–112)
Creatinine, Ser: 0.75 mg/dL (ref 0.50–1.10)
GFR calc non Af Amer: 83 mL/min — ABNORMAL LOW (ref >90)
GLUCOSE: 118 mg/dL — AB (ref 70–99)
Potassium: 3.5 mEq/L — ABNORMAL LOW (ref 3.7–5.3)
SODIUM: 142 meq/L (ref 137–147)
TOTAL PROTEIN: 5.6 g/dL — AB (ref 6.0–8.3)
Total Bilirubin: 1.3 mg/dL — ABNORMAL HIGH (ref 0.3–1.2)

## 2014-08-04 LAB — GLUCOSE, CAPILLARY
GLUCOSE-CAPILLARY: 145 mg/dL — AB (ref 70–99)
GLUCOSE-CAPILLARY: 160 mg/dL — AB (ref 70–99)
GLUCOSE-CAPILLARY: 166 mg/dL — AB (ref 70–99)
Glucose-Capillary: 134 mg/dL — ABNORMAL HIGH (ref 70–99)
Glucose-Capillary: 116 mg/dL — ABNORMAL HIGH (ref 70–99)
Glucose-Capillary: 145 mg/dL — ABNORMAL HIGH (ref 70–99)

## 2014-08-04 LAB — CBC
HCT: 33.8 % — ABNORMAL LOW (ref 36.0–46.0)
Hemoglobin: 11.1 g/dL — ABNORMAL LOW (ref 12.0–15.0)
MCH: 25.7 pg — AB (ref 26.0–34.0)
MCHC: 32.8 g/dL (ref 30.0–36.0)
MCV: 78.2 fL (ref 78.0–100.0)
Platelets: 130 10*3/uL — ABNORMAL LOW (ref 150–400)
RBC: 4.32 MIL/uL (ref 3.87–5.11)
RDW: 14.6 % (ref 11.5–15.5)
WBC: 3.8 10*3/uL — ABNORMAL LOW (ref 4.0–10.5)

## 2014-08-04 LAB — PROTIME-INR
INR: 1.23 (ref 0.00–1.49)
PROTHROMBIN TIME: 15.5 s — AB (ref 11.6–15.2)

## 2014-08-04 LAB — LIPASE, BLOOD: LIPASE: 160 U/L — AB (ref 11–59)

## 2014-08-04 MED ORDER — ALPRAZOLAM 0.5 MG PO TABS
0.5000 mg | ORAL_TABLET | Freq: Two times a day (BID) | ORAL | Status: DC | PRN
  Administered 2014-08-04 – 2014-08-05 (×3): 0.5 mg via ORAL
  Filled 2014-08-04 (×3): qty 1

## 2014-08-04 MED ORDER — HYDROMORPHONE HCL 1 MG/ML IJ SOLN
1.0000 mg | INTRAMUSCULAR | Status: AC | PRN
  Administered 2014-08-04 – 2014-08-05 (×4): 1 mg via INTRAVENOUS
  Filled 2014-08-04 (×4): qty 1

## 2014-08-04 MED ORDER — POTASSIUM CHLORIDE CRYS ER 20 MEQ PO TBCR: 40.0000 meq | EXTENDED_RELEASE_TABLET | Freq: Once | ORAL | Status: DC

## 2014-08-04 MED ORDER — ALPRAZOLAM 0.5 MG PO TABS
0.5000 mg | ORAL_TABLET | Freq: Once | ORAL | Status: AC
  Administered 2014-08-04: 0.5 mg via ORAL
  Filled 2014-08-04: qty 1

## 2014-08-04 MED ORDER — INFLUENZA VAC SPLIT QUAD 0.5 ML IM SUSY
0.5000 mL | PREFILLED_SYRINGE | INTRAMUSCULAR | Status: AC
  Administered 2014-08-05: 0.5 mL via INTRAMUSCULAR
  Filled 2014-08-04: qty 0.5

## 2014-08-04 NOTE — Progress Notes (Signed)
Patient seen and examined. Note reviewed.  She has been admitted with recurrent gallstone pancreatitis. She does have cholelithiasis without evidence of cholecystitis. Appreciate GI input. Due to her underlying cirrhosis, she would be considered a higher risk candidate for surgery. She may need referral to a higher level of care for cholecystectomy. Currently, she feels comfortable. Abdomen is benign on exam. She's on to advance her diet. We'll consider advancing her diet tomorrow if labs remain stable. We'll continue to follow.  MEMON,JEHANZEB

## 2014-08-04 NOTE — Consult Note (Signed)
Reason for Consult: pancreatitis Referring Physician: Hospitalist  [name] [name] is an 73 y.o. female.  HPI: Admitted to [facility] with diagnosis. Hx of same. Recently discharged 3 days ago with pancreatitis. States she has upper left abdominal x 1 day. She had nausea but no vomiting. She did not have a fever.  Rated pain at a 10. Today she does not have any pain. She says she feel great today. Some nausea this am.  Last pain medication this am per patient. She says she is hungry and thirsty.  She says the pain started after eating breakfast. She lives with her husband at home. Discharged 3 days ago with dx gallstone pancreatitis.  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 Noted on admission Lipase elevated.2576. Lipase today is pending.  Per records, the plan was to have her follow up at a tertiary center for possible ERCP with sphincterotomy or surgery.  She apparently had an appt with Dr. Ardis Hughs for an EUS recently but cancelled.    I/O last 3 completed shifts: In: 1547.5 [I.V.:1547.5] Out: 300 [Urine:300] Total I/O In: -  Out: 100 [Urine:100]     Past Medical History  Diagnosis Date  . CAD in native artery   . Thrombocytopenia, unspecified   . Unspecified hereditary and idiopathic peripheral neuropathy   . Unspecified fall   . Other and unspecified hyperlipidemia   . Unspecified essential hypertension   . Obesity, unspecified   . Personal history of neurosis   . Intrinsic asthma, unspecified   . Allergic rhinitis, cause unspecified   . Cirrhosis of liver     NASH, afp on 06/17/12 =3.7, per pt she had hep B vaccines in 1996, hep A in process.   . Colon adenomas 03/08/10    tcs by Dr. Gala Romney  . Hyperplastic polyps of stomach 03/08/10    tcs by Dr. Dudley Major  . Chronic gastritis 03/09/11    egd by Dr. Gala Romney  . History of hemorrhoids 03/08/10    tcs- internal and external  . Diverticula of colon 03/08/10    L side  . Esophagitis, erosive 03/08/10  . GERD (gastroesophageal reflux disease)    . Wears glasses   . Type II or unspecified type diabetes mellitus without mention of complication, not stated as uncontrolled   . Vertigo   . Pancreatitis   . Bilateral leg weakness   . Thrombocytopenia 04/12/2014  . Pancytopenia, acquired 04/12/2014  . Cirrhosis   . Abnormal EKG     hx of left anterior fasicular block on 04-09-13 ekg epic    Past Surgical History  Procedure Laterality Date  . Hysterectomy and btl      s/p  . Tumor excision  2003    rt arm and left foot  . Colonoscopy  03/08/10    Dr. [name]-->ext/int hemorrhoids, anal paipilla, rectal polyps, desc polyps, cecal polyp, left-sided diverticula. suboptiomal prep. next TCS 02/2015. multiple adenomas  . Esophagogastroduodenoscopy  03/08/10    Dr. [name] erosive RE, small hh, antral erosions, small 1cm are of mucosal indentation along gastric body of doubtful significance, cystic nodularity of hypophyarynx, base of tongue, base of epiglottis. benign gastric biopsies  . Tubal ligation    . Esophagogastroduodenoscopy  10/08/2011    Dr. [name] esophageal varices, antral erosion. next egd 09/2013  . Foot surgery  2003    lt foot  . Eye surgery      cataracts bilateral  . Orif humerus fracture Right 02/17/2013    Procedure: OPEN REDUCTION INTERNAL  FIXATION (ORIF) RIGHT PROXIMAL HUMERUS FRACTURE;  Surgeon: [name], MD;  Location: [facility];  Service: Orthopedics;  Laterality: Right;  . Partial gastrectomy      family denies  . Esophagogastroduodenoscopy N/A 02/17/2014    Dr. Gala Romney: portal gastropathy, no varices. Due for surveillance 2017  . Breast surgery Right few yrs ago    benign area removed  . Abdominal hysterectomy    . Cardiac catheterization  02/25/2007    Dr. [name]:  normal LV systolic function, mild irregularities of LAD    Family History  Problem Relation Age of Onset  . Coronary artery disease      FH  . Diabetes      FH  . Heart disease Mother   . Colon cancer Neg Hx     Social History:  reports  that she has never smoked. She has never used smokeless tobacco. She reports that she does not drink alcohol or use illicit drugs.  Allergies:  Allergies  Allergen Reactions  . Morphine And Related Itching  . Prochlorperazine Edisylate Other (See Comments)    Causes anxiety/numbness, pt not sure of this allergy  . Compazine [Prochlorperazine Edisylate] Anxiety    Causes anxiety & nervousness    Medications: I have reviewed the patient's current medications.  Results for orders placed during the hospital encounter of 08/03/14 (from the past 48 hour(s))  CBC WITH DIFFERENTIAL     Status: Abnormal   Collection Time    08/03/14  4:48 PM      Result Value Ref Range   WBC 7.8  4.0 - 10.5 K/uL   RBC 4.86  3.87 - 5.11 MIL/uL   Hemoglobin 12.6  12.0 - 15.0 g/dL   HCT 37.4  36.0 - 46.0 %   MCV 77.0 (*) 78.0 - 100.0 fL   MCH 25.9 (*) 26.0 - 34.0 pg   MCHC 33.7  30.0 - 36.0 g/dL   RDW 14.6  11.5 - 15.5 %   Platelets 140 (*) 150 - 400 K/uL   Neutrophils Relative % 82 (*) 43 - 77 %   Neutro Abs 6.4  1.7 - 7.7 K/uL   Lymphocytes Relative 8 (*) 12 - 46 %   Lymphs Abs 0.6 (*) 0.7 - 4.0 K/uL   Monocytes Relative 9  3 - 12 %   Monocytes Absolute 0.7  0.1 - 1.0 K/uL   Eosinophils Relative 1  0 - 5 %   Eosinophils Absolute 0.0  0.0 - 0.7 K/uL   Basophils Relative 0  0 - 1 %   Basophils Absolute 0.0  0.0 - 0.1 K/uL  COMPREHENSIVE METABOLIC PANEL     Status: Abnormal   Collection Time    08/03/14  4:48 PM      Result Value Ref Range   Sodium 139  137 - 147 mEq/L   Potassium 3.9  3.7 - 5.3 mEq/L   Chloride 102  96 - 112 mEq/L   CO2 25  19 - 32 mEq/L   Glucose, Bld 199 (*) 70 - 99 mg/dL   BUN 11  6 - 23 mg/dL   Creatinine, Ser 0.75  0.50 - 1.10 mg/dL   Calcium 8.7  8.4 - 10.5 mg/dL   Total Protein 6.3  6.0 - 8.3 g/dL   Albumin 3.2 (*) 3.5 - 5.2 g/dL   AST 27  0 - 37 U/L   ALT 22  0 - 35 U/L   Alkaline Phosphatase 128 (*) 39 - 117 U/L  Total Bilirubin 2.3 (*) 0.3 - 1.2 mg/dL   GFR calc  non Af Amer 83 (*) >90 mL/min   GFR calc Af Amer >90  >90 mL/min   Comment: (NOTE)     The eGFR has been calculated using the CKD EPI equation.     This calculation has not been validated in all clinical situations.     eGFR's persistently <90 mL/min signify possible Chronic Kidney     Disease.   Anion gap 12  5 - 15  LIPASE, BLOOD     Status: Abnormal   Collection Time    08/03/14  4:48 PM      Result Value Ref Range   Lipase 2576 (*) 11 - 59 U/L  I-STAT CG4 LACTIC ACID, ED     Status: None   Collection Time    08/03/14  5:14 PM      Result Value Ref Range   Lactic Acid, Venous 2.13  0.5 - 2.2 mmol/L  CBG MONITORING, ED     Status: Abnormal   Collection Time    08/03/14  7:10 PM      Result Value Ref Range   Glucose-Capillary 165 (*) 70 - 99 mg/dL  GLUCOSE, CAPILLARY     Status: Abnormal   Collection Time    08/03/14  8:48 PM      Result Value Ref Range   Glucose-Capillary 183 (*) 70 - 99 mg/dL   Comment 1 Notify RN     Comment 2 Documented in Chart    GLUCOSE, CAPILLARY     Status: Abnormal   Collection Time    08/04/14 12:54 AM      Result Value Ref Range   Glucose-Capillary 166 (*) 70 - 99 mg/dL   Comment 1 Notify RN    GLUCOSE, CAPILLARY     Status: Abnormal   Collection Time    08/04/14  6:05 AM      Result Value Ref Range   Glucose-Capillary 134 (*) 70 - 99 mg/dL   Comment 1 Notify RN    COMPREHENSIVE METABOLIC PANEL     Status: Abnormal   Collection Time    08/04/14  6:22 AM      Result Value Ref Range   Sodium 142  137 - 147 mEq/L   Potassium 3.5 (*) 3.7 - 5.3 mEq/L   Chloride 106  96 - 112 mEq/L   CO2 25  19 - 32 mEq/L   Glucose, Bld 118 (*) 70 - 99 mg/dL   BUN 12  6 - 23 mg/dL   Creatinine, Ser 0.75  0.50 - 1.10 mg/dL   Calcium 8.0 (*) 8.4 - 10.5 mg/dL   Total Protein 5.6 (*) 6.0 - 8.3 g/dL   Albumin 2.8 (*) 3.5 - 5.2 g/dL   AST 26  0 - 37 U/L   ALT 21  0 - 35 U/L   Alkaline Phosphatase 118 (*) 39 - 117 U/L   Total Bilirubin 1.3 (*) 0.3 - 1.2  mg/dL   GFR calc non Af Amer 83 (*) >90 mL/min   GFR calc Af Amer >90  >90 mL/min   Comment: (NOTE)     The eGFR has been calculated using the CKD EPI equation.     This calculation has not been validated in all clinical situations.     eGFR's persistently <90 mL/min signify possible Chronic Kidney     Disease.   Anion gap 11  5 - 15  CBC  Status: Abnormal   Collection Time    08/04/14  6:22 AM      Result Value Ref Range   WBC 3.8 (*) 4.0 - 10.5 K/uL   RBC 4.32  3.87 - 5.11 MIL/uL   Hemoglobin 11.1 (*) 12.0 - 15.0 g/dL   HCT 33.8 (*) 36.0 - 46.0 %   MCV 78.2  78.0 - 100.0 fL   MCH 25.7 (*) 26.0 - 34.0 pg   MCHC 32.8  30.0 - 36.0 g/dL   RDW 14.6  11.5 - 15.5 %   Platelets 130 (*) 150 - 400 K/uL  PROTIME-INR     Status: Abnormal   Collection Time    08/04/14  6:22 AM      Result Value Ref Range   Prothrombin Time 15.5 (*) 11.6 - 15.2 seconds   INR 1.23  0.00 - 1.49  GLUCOSE, CAPILLARY     Status: Abnormal   Collection Time    08/04/14  7:22 AM      Result Value Ref Range   Glucose-Capillary 116 (*) 70 - 99 mg/dL   Comment 1 Notify RN    GLUCOSE, CAPILLARY     Status: Abnormal   Collection Time    08/04/14 11:15 AM      Result Value Ref Range   Glucose-Capillary 145 (*) 70 - 99 mg/dL   Comment 1 Notify RN      No results found.  ROS Blood pressure 103/87, pulse 76, temperature 98.6 F (37 C), temperature source Oral, resp. rate 18, height 5' 8" (1.727 m), weight 228 lb (103.42 kg), SpO2 97.00%. Physical Exam Alert and oriented. Skin warm and dry. Oral mucosa is moist.   . Sclera anicteric, conjunctivae is pink. Thyroid not enlarged. No cervical lymphadenopathy. Lungs clear. Heart regular rate and rhythm.  Abdomen is soft. Bowel sounds are positive. No hepatomegaly.Obese.  Bruising to abdomen.  No abdominal masses felt. No tenderness.  No edema to lower extremities.    Assessment/Plan: Probably biliary pancreatitis. Lipase elevated yesterday. Lipase for today  pending. Keep patient NPO. Will discuss with Dr. Laural Golden.   Nolberto Hanlon 08/04/2014, 12:37 PM    GI attending note; Patient interviewed and examined. Recent records reviewed including MRCP from 07/29/2014. Patient is 73 year old Caucasian female with multiple medical problems including cirrhosis secondary to NAFLD who was just discharged following therapy for biliary pancreatitis. Patient was seen by Dr. Aviva Signs of surgical service who recommended referral to tertiary center because of increased risk. Patient was discharged on 07/31/2014. She did well until yesterday morning when she developed severe epigastric pain and nausea after eating breakfast. She came to the emergency room yesterday afternoon for persistent symptoms. Evaluation in the emergency room revealed elevated serum lipase of 2576(it was 34 at the time of discharge on 07/31/2014). She was therefore felt to have recurrent pancreatitis and hospitalized. Patient's transaminases have remained normal electrolytes admission. She feels much better this afternoon. She denies abdominal pain. She is hungry. Serum lipase is morning was down to 160. Examination is pertinent for mild midepigastric tenderness.  Assessment; Recurrent biliary pancreatitis. Patient does not have criteria of severe or fulminant pancreatitis which she is clearly at risk. Patient is high risk for cholecystectomy. However she needs to have her gallbladder removed before she develops severe pancreatitis which would carry even higher morbidity.  Recommendations; Continue NPO until tomorrow morning. Clear liquids starting in am. Consider referral to tertiary center for cholecystectomy. Will discuss with Dr. Gala Romney.

## 2014-08-04 NOTE — Care Management Utilization Note (Signed)
UR completed 

## 2014-08-04 NOTE — Progress Notes (Signed)
Patient requested her nerve pill. Paged on-call MD, followed orders given.

## 2014-08-04 NOTE — Care Management Note (Signed)
    Page 1 of 2   08/06/2014     5:01:10 PM CARE MANAGEMENT NOTE 08/06/2014  Patient:  [name] [name], [name] [name]   Account Number:  [id number]  Date Initiated:  08/04/2014  Documentation initiated by:  Vladimir Creeks  Subjective/Objective Assessment:   Admitted with acute gallstone pancreatitis, and NASH cirrhosis, N&V, abd pain. pt is from home with spouse, and will return home at D/C. They have [place] active in the home.     Action/Plan:   Will follow for needs   Anticipated DC Date:  08/06/2014   Anticipated DC Plan:  [place]  CM consult      [facility] Choice  HOME HEALTH   Choice offered to / List presented to:  C-1 Patient        [place] arranged  HH-1 RN  [place] PT      [place].   Status of service:  Completed, signed off Medicare Important Message given?  YES (If response is "NO", the following Medicare IM given date fields will be blank) Date Medicare IM given:  08/06/2014 Medicare IM given by:  Vladimir Creeks Date Additional Medicare IM given:   Additional Medicare IM given by:    Discharge Disposition:  [place]  Per UR Regulation:  Reviewed for med. necessity/level of care/duration of stay  If discussed at [place] of Stay Meetings, dates discussed:    Comments:  08/06/14 [place] RN/CM Pt D/C home and will follow with GI for possible appt tertiary care facility for gall bladder surgery 08/05/14 1400 Vladimir Creeks RN/CM pt states she may be transferred to tertiary facility for gallblader surgery 08/04/14 [place] RN/CM

## 2014-08-04 NOTE — Progress Notes (Signed)
[facility] PROGRESS NOTE  [name] [name] MOQ:[id number] DOB: [birth year] DOA: 08/03/2014 PCP: Purvis Kilts, MD  Assessment/Plan: 1. Recurrent acute gallstone pancreatitis: just discharge 2 days ago for same. Discharge summary at that time indicates patient's preoperative mortality risk would be significant in this Child's B. Patient.  Recent MRCP revealed mild to moderate pancreatitis but no acute cholecystitis. Lipase 2576. Await GI consult. Much improved this am. Await today's lipase.  Currently NPO. Will provide clear liquids if lipase trending downward.  2. Cirrhosis secondary to [place]. Home meds include spironolactone, lasix and lactulose and rifaximin. Holding these for now.  3. Diabetes. CBG controlled. Await A1C. Continue SSI for now.  4. Hypertension, somewhat soft. Holding lasix for now. monitor 5. Obesity. BMI 35.  6. Leukopenia and thrombocytopenia: related to #2. Chart review indicates baseline WBC range 2.5-3 and platelet baseline range 77-115. Current levels within these ranges.   Code Status: full Family Communication: none present Disposition Plan: home when ready   Consultants:  Dr Laural Golden  Procedures:  none  Antibiotics:    HPI/Subjective: Awake reports less pain this am. Denies nausea/  Objective: Filed Vitals:   08/04/14 0606  BP: 103/87  Pulse: 76  Temp: 98.6 F (37 C)  Resp: 18    Intake/Output Summary (Last 24 hours) at 08/04/14 1230 Last data filed at 08/04/14 1042  Gross per 24 hour  Intake 1547.5 ml  Output    400 ml  Net 1147.5 ml   Filed Weights   08/03/14 1548 08/03/14 2044  Weight: 104.327 kg (230 lb) 103.42 kg (228 lb)    Exam:   General:  Obese appears comfortable  Cardiovascular: RRR No MGR no LE edema  Respiratory: normal effort BS somewhat distant no wheeze or crackles  Abdomen: non distended sluggish BS non tender  Musculoskeletal: no clubbing or cyanosis   Data Reviewed: Basic Metabolic  Panel:  Recent Labs Lab 07/29/14 0558 07/30/14 0645 07/31/14 0504 08/03/14 1648 08/04/14 0622  NA 137 139 136* 139 142  K 4.5 4.4 3.7 3.9 3.5*  CL 103 106 104 102 106  CO2 23 22 21 25 25   GLUCOSE 315* 180* 190* 199* 118*  BUN 21 16 10 11 12   CREATININE 0.83 0.81 0.65 0.75 0.75  CALCIUM 8.7 8.3* 8.1* 8.7 8.0*   Liver Function Tests:  Recent Labs Lab 07/29/14 0558 07/30/14 0645 07/31/14 0504 08/03/14 1648 08/04/14 0622  AST 204* 71* 34 27 26  ALT 101* 66* 42* 22 21  ALKPHOS 142* 133* 113 128* 118*  BILITOT 1.9* 1.7* 1.3* 2.3* 1.3*  PROT 6.0 5.9* 5.5* 6.3 5.6*  ALBUMIN 3.2* 3.1* 2.8* 3.2* 2.8*    Recent Labs Lab 07/28/14 2026 07/29/14 0558 07/30/14 0645 07/31/14 0504 08/03/14 1648  LIPASE 1952* 2626* 120* 34 2576*   No results found for this basename: AMMONIA,  in the last 168 hours CBC:  Recent Labs Lab 07/28/14 2026 07/30/14 0645 07/31/14 0504 08/03/14 1648 08/04/14 0622  WBC 9.0 3.6* 3.9* 7.8 3.8*  NEUTROABS 6.5  --   --  6.4  --   HGB 13.6 11.7* 10.8* 12.6 11.1*  HCT 40.2 35.2* 31.9* 37.4 33.8*  MCV 76.4* 77.7* 76.5* 77.0* 78.2  PLT 151 100* 99* 140* 130*   Cardiac Enzymes:  Recent Labs Lab 07/28/14 2340  TROPONINI <0.30   BNP (last 3 results) No results found for this basename: PROBNP,  in the last 8760 hours CBG:  Recent Labs Lab 08/03/14 2048 08/04/14 0054 08/04/14 0605 08/04/14  2376 08/04/14 1115  GLUCAP 183* 166* 134* 116* 145*    Recent Results (from the past 240 hour(s))  URINE CULTURE     Status: None   Collection Time    07/29/14  3:55 PM      Result Value Ref Range Status   Specimen Description URINE, CLEAN CATCH   Final   Special Requests NONE   Final   Culture  Setup Time     Final   Value: 07/30/2014 00:18     Performed at [place]     Final   Value: >=100,000 COLONIES/ML     Performed at [facility]   Culture     Final   Value: Multiple bacterial morphotypes present, none  predominant. Suggest appropriate recollection if clinically indicated.     Performed at [facility]   Report Status 07/31/2014 FINAL   Final  MRSA PCR SCREENING     Status: None   Collection Time    07/30/14 11:45 AM      Result Value Ref Range Status   MRSA by PCR NEGATIVE  NEGATIVE Final   Comment:            The GeneXpert MRSA Assay (FDA     approved for NASAL specimens     only), is one component of a     comprehensive MRSA colonization     surveillance program. It is not     intended to diagnose MRSA     infection nor to guide or     monitor treatment for     MRSA infections.     Studies: No results found.  Scheduled Meds: . heparin  5,000 Units Subcutaneous 3 times per day  . [START ON 08/05/2014] Influenza vac split quadrivalent PF  0.5 mL Intramuscular Tomorrow-1000  . insulin aspart  0-20 Units Subcutaneous Q6H  . insulin aspart  0-5 Units Subcutaneous QHS   Continuous Infusions: . sodium chloride 150 mL/hr at 08/04/14 1055    Principal Problem:   Acute gallstone pancreatitis Active Problems:   DIABETES, TYPE 2   OBESITY   CIRRHOSIS   Essential hypertension   Anemia   Thrombocytopenia    Time spent: 35 minutes    [place] [facility] Pager [phone]. If 7PM-7AM, please contact night-coverage at [email or web address], password [email or web address] 08/04/2014, 12:30 PM  LOS: 1 day

## 2014-08-05 DIAGNOSIS — E119 Type 2 diabetes mellitus without complications: Secondary | ICD-10-CM

## 2014-08-05 DIAGNOSIS — K859 Acute pancreatitis without necrosis or infection, unspecified: Secondary | ICD-10-CM | POA: Diagnosis not present

## 2014-08-05 LAB — CBC
HCT: 31.9 % — ABNORMAL LOW (ref 36.0–46.0)
Hemoglobin: 10.4 g/dL — ABNORMAL LOW (ref 12.0–15.0)
MCH: 25.4 pg — ABNORMAL LOW (ref 26.0–34.0)
MCHC: 32.6 g/dL (ref 30.0–36.0)
MCV: 78 fL (ref 78.0–100.0)
PLATELETS: 105 10*3/uL — AB (ref 150–400)
RBC: 4.09 MIL/uL (ref 3.87–5.11)
RDW: 14.6 % (ref 11.5–15.5)
WBC: 2.5 10*3/uL — AB (ref 4.0–10.5)

## 2014-08-05 LAB — GLUCOSE, CAPILLARY
GLUCOSE-CAPILLARY: 204 mg/dL — AB (ref 70–99)
GLUCOSE-CAPILLARY: 148 mg/dL — AB (ref 70–99)
Glucose-Capillary: 122 mg/dL — ABNORMAL HIGH (ref 70–99)
Glucose-Capillary: 137 mg/dL — ABNORMAL HIGH (ref 70–99)
Glucose-Capillary: 116 mg/dL — ABNORMAL HIGH (ref 70–99)

## 2014-08-05 LAB — MAGNESIUM: Magnesium: 1.7 mg/dL (ref 1.5–2.5)

## 2014-08-05 LAB — BASIC METABOLIC PANEL
ANION GAP: 9 (ref 5–15)
BUN: 13 mg/dL (ref 6–23)
CHLORIDE: 109 meq/L (ref 96–112)
CO2: 24 mEq/L (ref 19–32)
Calcium: 7.9 mg/dL — ABNORMAL LOW (ref 8.4–10.5)
Creatinine, Ser: 0.69 mg/dL (ref 0.50–1.10)
GFR calc non Af Amer: 86 mL/min — ABNORMAL LOW (ref >90)
Glucose, Bld: 135 mg/dL — ABNORMAL HIGH (ref 70–99)
POTASSIUM: 3.5 meq/L — AB (ref 3.7–5.3)
SODIUM: 142 meq/L (ref 137–147)

## 2014-08-05 LAB — HEMOGLOBIN A1C
Hgb A1c MFr Bld: 7.3 % — ABNORMAL HIGH (ref <5.7)
MEAN PLASMA GLUCOSE: 163 mg/dL — AB (ref <117)

## 2014-08-05 LAB — LIPASE, BLOOD: Lipase: 34 U/L (ref 11–59)

## 2014-08-05 MED ORDER — INSULIN ASPART 100 UNIT/ML ~~LOC~~ SOLN
0.0000 [IU] | Freq: Three times a day (TID) | SUBCUTANEOUS | Status: DC
  Administered 2014-08-06: 3 [IU] via SUBCUTANEOUS
  Administered 2014-08-06: 4 [IU] via SUBCUTANEOUS

## 2014-08-05 MED ORDER — POTASSIUM CHLORIDE CRYS ER 20 MEQ PO TBCR
40.0000 meq | EXTENDED_RELEASE_TABLET | ORAL | Status: AC
  Administered 2014-08-05 (×2): 40 meq via ORAL
  Filled 2014-08-05 (×2): qty 2

## 2014-08-05 NOTE — Progress Notes (Signed)
Patient seen and examined.  Note reviewed.  This patient was admitted to the hospital with recurrent gallstone pancreatitis. Review records indicate that her first episode may been around 04/2013. She had one episode in 2014, and she has had 3 episodes in 2015. Her last discharge was 3 days prior to her recurrent admission where she was also treated for gallstone pancreatitis. She's been treated supportively with clinical improvement. Her lipase has trended down her symptoms have improved. Due to her history of cirrhosis, she would have some increased risk for cholecystectomy, although with the recurrent nature of her illnesses and frequent admissions, I feel it may be beneficial to undergo this surgery prior to discharge. We'll discuss further with GI and general surgery. Agree with advancing her diet.  MEMON,JEHANZEB

## 2014-08-05 NOTE — Progress Notes (Signed)
Subjective:  Feels much better. Wants to eat.  Objective: Vital signs in last 24 hours: Temp:  [98.1 F (36.7 C)-99.5 F (37.5 C)] 98.1 F (36.7 C) (09/24 5009) Pulse Rate:  [65-98] 66 (09/24 0613) Resp:  [16-20] 16 (09/24 3818) BP: (102-129)/(48-83) 102/48 mmHg (09/24 0613) SpO2:  [93 %-97 %] 97 % (09/24 0613) Last BM Date: 08/03/14 General:   Alert,  Well-developed, well-nourished, pleasant and cooperative in NAD Head:  Normocephalic and atraumatic. Eyes:  Sclera clear, no icterus.   Abdomen:  Soft, nontender and nondistended. No masses, hepatosplenomegaly or hernias noted. Normal bowel sounds, without guarding, and without rebound.   Extremities:  Without clubbing, deformity or edema. Neurologic:  Alert and  oriented x4;  grossly normal neurologically. Skin:  Intact without significant lesions or rashes. Psych:  Alert and cooperative. Normal mood and affect.  Intake/Output from previous day: 09/23 0701 - 09/24 0700 In: -  Out: 500 [Urine:500] Intake/Output this shift:    Lab Results: CBC  Recent Labs  08/03/14 1648 08/04/14 0622 08/05/14 0643  WBC 7.8 3.8* 2.5*  HGB 12.6 11.1* 10.4*  HCT 37.4 33.8* 31.9*  MCV 77.0* 78.2 78.0  PLT 140* 130* 105*   BMET  Recent Labs  08/03/14 1648 08/04/14 0622 08/05/14 0643  NA 139 142 142  K 3.9 3.5* 3.5*  CL 102 106 109  CO2 25 25 24   GLUCOSE 199* 118* 135*  BUN 11 12 13   CREATININE 0.75 0.75 0.69  CALCIUM 8.7 8.0* 7.9*   LFTs  Recent Labs  08/03/14 1648 08/04/14 0622  BILITOT 2.3* 1.3*  ALKPHOS 128* 118*  AST 27 26  ALT 22 21  PROT 6.3 5.6*  ALBUMIN 3.2* 2.8*    Recent Labs  08/03/14 1648 08/04/14 0622 08/05/14 0643  LIPASE 2576* 160* 34   PT/INR  Recent Labs  08/04/14 0622  LABPROT 15.5*  INR 1.23      Imaging Studies:     Mr Abd W/wo Cm/mrcp  07/29/2014   CLINICAL DATA:  Cirrhosis and portal hypertension. Reported diagnosis of pancreatitis  EXAM: MRI ABDOMEN WITHOUT AND WITH  CONTRAST (INCLUDING MRCP)  TECHNIQUE: Multiplanar multisequence MR imaging of the abdomen was performed both before and after the administration of intravenous contrast. Heavily T2-weighted images of the biliary and pancreatic ducts were obtained, and three-dimensional MRCP images were rendered by post processing.  CONTRAST:  1mL MULTIHANCE GADOBENATE DIMEGLUMINE 529 MG/ML IV SOLN  COMPARISON:  The most recent similar comparison exam is abdominal ultrasound 11/19/2013 and CT abdomen/ pelvis 04/26/2013. MRI abdomen 06/29/2011.  FINDINGS: Multiple series are degraded by patient motion. There is mild to moderate diffuse pancreatic edema and trace peripancreatic fluid. Nodular hepatic contour and splenomegaly reidentified compatible with previously diagnosed cirrhosis and evidence of portal hypertension. Trace pericholecystic fluid and or gallbladder wall thickening reidentified with dependent tiny stones noted. 1.3 cm cyst at the dome of the right hepatic lobe reidentified as well as T2 hyperintense hepatic lesions measuring less than 5 mm statistically most likely cysts or hamartomas.  Bilateral T2 hyperintense renal cortical cysts are reidentified. 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 Adrenal glands are normal. No focal splenic lesion. No intrahepatic, common duct, or pancreatic ductal dilatation. No lymphadenopathy. Visualized bowel is unremarkable.  After administration of contrast, visualization is again suboptimal on multiple series because of patient respiratory motion. Allowing for this, no abnormal enhancement is seen. There is inhomogeneous hypoenhancement of the pancreas.  On heavily T2 weighted sequences, No common duct filling  defect, focal cut off, or other abnormality is identified.  IMPRESSION: Findings compatible with mild-to-moderate pancreatitis without complicating features such as peripancreatic fluid collection to suggest pseudocyst formation.  No common duct filling defect  or contour abnormality.  Gallstones with mild gallbladder wall thickening and/or pericholecystic fluid compatible with hypoproteinemia/changes of cirrhosis and portal hypertension. Coincident cholecystitis would be significantly less likely.   Electronically Signed   By: Conchita Paris M.D.   On: 07/29/2014 13:14  [2 weeks]   Assessment: 73 y/o female with NASH cirrhosis who has recurrent biliary pancreatitis. Clinically improved.  Child Pugh Class B MELD [facility] 6  Plan: 1. Clear liquid diet.  2. Consider referral to tertiary center for cholecystectomy. She is at risk of developing severe pancreatitis with recurrent episodes.    LOS: 2 days   Neil Crouch  08/05/2014, 7:56 AM   Attending note:  Abdominal pain much better. Tolerating clear liquids. Wants diet advanced. Abdomen is soft and nontender this evening. Will advance to a low-fat diet. Needs semi-urgent outpatient consultation with a general surgeon at a tertiary referral center for consideration of a laparoscopic cholecystectomy. This should be accomplished ASAP.

## 2014-08-05 NOTE — Progress Notes (Signed)
[facility] PROGRESS NOTE  [name] [name] BSJ:[id number] DOB: 10-09-1941 DOA: 08/03/2014 PCP: Purvis Kilts, MD  Assessment/Plan: 1. Recurrent acute gallstone pancreatitis: recent discharge for same. Discharge summary at that time indicates patient's preoperative mortality risk would be significant in this Child's B. Patient. Recent MRCP revealed mild to moderate pancreatitis but no acute cholecystitis. Lipase trending down to 160 today. Evaluated by GI who opine patient at risk for severe or fulminant pancreatitis and that she is high risk for cholecystectomy as well.  Much improved and tolerating clear liquids. Patient reports wanting to speak to Dr Gala Romney regarding transfer/surgery.  2. Cirrhosis secondary to [place]. Home meds include spironolactone, lasix and lactulose and rifaximin. Holding these for now.  3. Diabetes. CBG range 122-204. A1C 7.3. Continue SSI for now.  4. Hypertension, remains somewhat soft at times. Holding lasix for now. monitor 5. Obesity. BMI 35.  6. Leukopenia and thrombocytopenia: related to #2. Chart review indicates baseline WBC range 2.5-3 and platelet baseline range 77-115. Current levels within these ranges. 7. Hypokalemia: will replace and recheck   Code Status: full Family Communication: none present Disposition Plan: home when ready   Consultants:  GI  Procedures:  none  Antibiotics:  none  HPI/Subjective: Awake alert. Reports several BM's today somewhat loose. Reports improved abdominal pain.  Objective: Filed Vitals:   08/05/14 0613  BP: 102/48  Pulse: 66  Temp: 98.1 F (36.7 C)  Resp: 16    Intake/Output Summary (Last 24 hours) at 08/05/14 1403 Last data filed at 08/05/14 1040  Gross per 24 hour  Intake    120 ml  Output    400 ml  Net   -280 ml   Filed Weights   08/03/14 1548 08/03/14 2044  Weight: 104.327 kg (230 lb) 103.42 kg (228 lb)    Exam:   General:  Obese appears comfortable  Cardiovascular: RRR No  MGR no LE edema  Respiratory: normal effort BS clear to auscultation no wheeze  Abdomen: obese soft +BS   Musculoskeletal: no clubbing or cyanosis   Data Reviewed: Basic Metabolic Panel:  Recent Labs Lab 07/30/14 0645 07/31/14 0504 08/03/14 1648 08/04/14 0622 08/05/14 0643  NA 139 136* 139 142 142  K 4.4 3.7 3.9 3.5* 3.5*  CL 106 104 102 106 109  CO2 22 21 25 25 24   GLUCOSE 180* 190* 199* 118* 135*  BUN 16 10 11 12 13   CREATININE 0.81 0.65 0.75 0.75 0.69  CALCIUM 8.3* 8.1* 8.7 8.0* 7.9*  MG  --   --   --   --  1.7   Liver Function Tests:  Recent Labs Lab 07/30/14 0645 07/31/14 0504 08/03/14 1648 08/04/14 0622  AST 71* 34 27 26  ALT 66* 42* 22 21  ALKPHOS 133* 113 128* 118*  BILITOT 1.7* 1.3* 2.3* 1.3*  PROT 5.9* 5.5* 6.3 5.6*  ALBUMIN 3.1* 2.8* 3.2* 2.8*    Recent Labs Lab 07/30/14 0645 07/31/14 0504 08/03/14 1648 08/04/14 0622 08/05/14 0643  LIPASE 120* 34 2576* 160* 34   No results found for this basename: AMMONIA,  in the last 168 hours CBC:  Recent Labs Lab 07/30/14 0645 07/31/14 0504 08/03/14 1648 08/04/14 0622 08/05/14 0643  WBC 3.6* 3.9* 7.8 3.8* 2.5*  NEUTROABS  --   --  6.4  --   --   HGB 11.7* 10.8* 12.6 11.1* 10.4*  HCT 35.2* 31.9* 37.4 33.8* 31.9*  MCV 77.7* 76.5* 77.0* 78.2 78.0  PLT 100* 99* 140* 130* 105*  Cardiac Enzymes: No results found for this basename: CKTOTAL, CKMB, CKMBINDEX, TROPONINI,  in the last 168 hours BNP (last 3 results) No results found for this basename: PROBNP,  in the last 8760 hours CBG:  Recent Labs Lab 08/04/14 1653 08/04/14 2337 08/05/14 0610 08/05/14 0829 08/05/14 1133  GLUCAP 145* 160* 122* 137* 204*    Recent Results (from the past 240 hour(s))  URINE CULTURE     Status: None   Collection Time    07/29/14  3:55 PM      Result Value Ref Range Status   Specimen Description URINE, CLEAN CATCH   Final   Special Requests NONE   Final   Culture  Setup Time     Final   Value: 07/30/2014  00:18     Performed at [place]     Final   Value: >=100,000 COLONIES/ML     Performed at [facility]   Culture     Final   Value: Multiple bacterial morphotypes present, none predominant. Suggest appropriate recollection if clinically indicated.     Performed at [facility]   Report Status 07/31/2014 FINAL   Final  MRSA PCR SCREENING     Status: None   Collection Time    07/30/14 11:45 AM      Result Value Ref Range Status   MRSA by PCR NEGATIVE  NEGATIVE Final   Comment:            The GeneXpert MRSA Assay (FDA     approved for NASAL specimens     only), is one component of a     comprehensive MRSA colonization     surveillance program. It is not     intended to diagnose MRSA     infection nor to guide or     monitor treatment for     MRSA infections.     Studies: No results found.  Scheduled Meds: . heparin  5,000 Units Subcutaneous 3 times per day  . insulin aspart  0-20 Units Subcutaneous Q6H  . insulin aspart  0-5 Units Subcutaneous QHS  . potassium chloride  40 mEq Oral Once  . potassium chloride  40 mEq Oral Q4H   Continuous Infusions: . sodium chloride 75 mL/hr at 08/05/14 1233    Principal Problem:   Acute gallstone pancreatitis Active Problems:   DIABETES, TYPE 2   OBESITY   CIRRHOSIS   Essential hypertension   Anemia   Thrombocytopenia    Time spent: 35 minutes    [place] [facility] Pager [phone]. If 7PM-7AM, please contact night-coverage at [email or web address], password [facility] 08/05/2014, 2:03 PM  LOS: 2 days

## 2014-08-06 ENCOUNTER — Telehealth: Payer: Self-pay | Admitting: Gastroenterology

## 2014-08-06 LAB — BASIC METABOLIC PANEL
Anion gap: 8 (ref 5–15)
BUN: 12 mg/dL (ref 6–23)
CO2: 25 mEq/L (ref 19–32)
Calcium: 8 mg/dL — ABNORMAL LOW (ref 8.4–10.5)
Chloride: 111 mEq/L (ref 96–112)
Creatinine, Ser: 0.73 mg/dL (ref 0.50–1.10)
GFR calc non Af Amer: 84 mL/min — ABNORMAL LOW (ref >90)
GLUCOSE: 136 mg/dL — AB (ref 70–99)
POTASSIUM: 3.8 meq/L (ref 3.7–5.3)
Sodium: 144 mEq/L (ref 137–147)

## 2014-08-06 LAB — CBC
HEMATOCRIT: 31.5 % — AB (ref 36.0–46.0)
HEMOGLOBIN: 10.5 g/dL — AB (ref 12.0–15.0)
MCH: 26.2 pg (ref 26.0–34.0)
MCHC: 33.3 g/dL (ref 30.0–36.0)
MCV: 78.6 fL (ref 78.0–100.0)
Platelets: 111 10*3/uL — ABNORMAL LOW (ref 150–400)
RBC: 4.01 MIL/uL (ref 3.87–5.11)
RDW: 14.7 % (ref 11.5–15.5)
WBC: 2.1 10*3/uL — ABNORMAL LOW (ref 4.0–10.5)

## 2014-08-06 LAB — GLUCOSE, CAPILLARY
GLUCOSE-CAPILLARY: 130 mg/dL — AB (ref 70–99)
Glucose-Capillary: 163 mg/dL — ABNORMAL HIGH (ref 70–99)

## 2014-08-06 MED ORDER — ONDANSETRON HCL 4 MG PO TABS: 4.0000 mg | ORAL_TABLET | Freq: Four times a day (QID) | ORAL | Status: DC | PRN

## 2014-08-06 NOTE — Discharge Summary (Signed)
Patient seen and examined. Agree with note as above.  Patient was admitted to the hospital with acute gallstone pancreatitis. She's had recurrent episodes over the past several months. She was treated supportively with IV fluids, bowel rest and pain medications. Her diet has been advanced to a solid diet she appears to be tolerating this. She was seen by GI and plans are for referral to [facility] for consideration of cholecystectomy. The patient is ready for discharge.  [name] [name]

## 2014-08-06 NOTE — Telephone Encounter (Signed)
Pt has an appointment with Dr.Howerton on Tuesday 08/10/14@ 11:00 am

## 2014-08-06 NOTE — Progress Notes (Signed)
IV removed. Discharge instruction reviewed with patient and husband. Understanding verbalized. Ready for discharge home

## 2014-08-06 NOTE — Progress Notes (Signed)
Patient lying in bed resting quietly with eyes closed at this time. No complaints have been voiced this shift.

## 2014-08-06 NOTE — Telephone Encounter (Signed)
Please arrange for a semi-urgent consultation with general surgery at [facility] for "recurrent biliary pancreatitis in setting of cirrhosis". 3 hospitalizations in last couple of months.   Dr. Gala Romney requesting patient to be seen next week please.

## 2014-08-06 NOTE — Discharge Summary (Signed)
Physician Discharge Summary  [name] [name] HFW:[id number] DOB: [birth year] DOA: 08/03/2014  PCP: Purvis Kilts, MD  Admit date: 08/03/2014 Discharge date: 08/06/2014  Time spent: 40 minutes  Recommendations for Outpatient Follow-up:  1. Has appointment with Dr Eugenia Pancoast at [facility] 08/10/14 2. Has appointment with GI 08/11/14 for evaluation of symtom  Discharge Diagnoses:  Principal Problem:   Acute gallstone pancreatitis Active Problems:   DIABETES, TYPE 2   OBESITY   CIRRHOSIS   Essential hypertension   Anemia   Thrombocytopenia   Discharge Condition:   Diet recommendation: carb modified low fat  Filed Weights   08/03/14 1548 08/03/14 2044  Weight: 104.327 kg (230 lb) 103.42 kg (228 lb)    History of present illness:  This 73 year old lady, who is well-known to the hospital, presented once again on 08/03/14 with abdominal pain, nausea and vomiting. She was just discharged 3 days prior having been admitted for acute gallstone pancreatitis. The plan was for her to followup as an outpatient with gastroenterology and possibly go to a tertiary center such as [facility]. It was not felt that she was a good candidate for surgery and options included ERCP with sphincterotomy or surgery a tertiary care center. She also has liver cirrhosis secondary to [place]. She was admitted because she once again had acute pancreatitis based on symptoms and lipase which was significantly elevated. Hospital Course:  1. Recurrent acute gallstone pancreatitis: recent discharge for same. Discharge summary at that time indicated patient's preoperative mortality risk would be significant in this Child's B. Patient. Recent MRCP revealed mild to moderate pancreatitis but no acute cholecystitis. Provided with bowel rest, IV fluids and  Lipase trended down to 34 on day of discharge. Evaluated by GI who opine patient at risk for severe or fulminant pancreatitis and that she is high risk for cholecystectomy as  well. As such recommended semi-urgent consultation with surgery at [facility]. This is scheduled for 08/10/14. At discharge she is pain free and toleration low fat diet.  2. Cirrhosis secondary to [place]. Home meds include spironolactone, lasix and lactulose and rifaximin. Resume at discharge.  3. Diabetes. CBG range 130-163. A1C 7.3.   4. Hypertension, remained somewhat soft at times.resume lasix 5. Obesity. BMI 35.  6. Leukopenia and thrombocytopenia: related to #2. Chart review indicates baseline WBC range 2.5-3 and platelet baseline range 77-115. Current levels within these ranges. 7. Hypokalemia: resolved at discharge.    Procedures:  none  Consultations:  Dr Gala Romney GI  Discharge Exam: Filed Vitals:   08/06/14 0613  BP: 104/55  Pulse: 52  Temp: 97.5 F (36.4 C)  Resp: 16    General: obese appears comfortable Cardiovascular: RRR i hear no murmur gallup or rub Respiratory: normal effort BS clear bilaterally no wheeze Abdomen:soft non-distended.   Discharge Instructions You were cared for by a hospitalist during your hospital stay. If you have any questions about your discharge medications or the care you received while you were in the hospital after you are discharged, you can call the unit and asked to speak with the hospitalist on call if the hospitalist that took care of you is not available. Once you are discharged, your primary care physician will handle any further medical issues. Please note that NO REFILLS for any discharge medications will be authorized once you are discharged, as it is imperative that you return to your primary care physician (or establish a relationship with a primary care physician if you do not have one) for your aftercare needs  so that they can reassess your need for medications and monitor your lab values.  Discharge Instructions   Diet - low sodium heart healthy    Complete by:  As directed      Discharge instructions    Complete by:  As directed    Take medications as directed. Follow up with Dr Eugenia Pancoast at [facility] as scheduled     Increase activity slowly    Complete by:  As directed           Current Discharge Medication List    START taking these medications   Details  ondansetron (ZOFRAN) 4 MG tablet Take 1 tablet (4 mg total) by mouth every 6 (six) hours as needed for nausea. Qty: 20 tablet, Refills: 0      CONTINUE these medications which have NOT CHANGED   Details  ALPRAZolam (XANAX) 0.5 MG tablet Take one tablet by mouth three times daily as needed for anxiety Qty: 90 tablet, Refills: 5    aspirin EC 325 MG EC tablet Take 1 tablet (325 mg total) by mouth every morning. Qty: 30 tablet, Refills: 0    budesonide-formoterol (SYMBICORT) 160-4.5 MCG/ACT inhaler Inhale 2 puffs into the lungs at bedtime.     ESTRACE VAGINAL 0.1 MG/GM vaginal cream Place 1 Applicatorful vaginally every other day. At  bedtime    furosemide (LASIX) 20 MG tablet Take 20 mg by mouth every morning.    insulin aspart (NOVOLOG FLEXPEN) 100 UNIT/ML FlexPen Inject 2-10 Units into the skin 3 (three) times daily with meals. Sliding Scale: 200-250= 2 units 251-300= 4 units 301-350= 6 units 351-400= 8 units If levels are over 400, GIVE 10 UNITS AND CALL MD    insulin detemir (LEVEMIR) 100 UNIT/ML injection Inject 60 Units into the skin at bedtime.     lactulose, encephalopathy, (GENERLAC) 10 GM/15ML SOLN Take 45 mLs (30 g total) by mouth 3 (three) times daily. Qty: 2700 mL, Refills: 0    meclizine (ANTIVERT) 25 MG tablet Take 25 mg by mouth 3 (three) times daily as needed for dizziness or nausea.     Multiple Vitamins-Minerals (CENTRUM SILVER PO) Take 1 tablet by mouth daily.     NEXIUM 40 MG capsule Take 40 mg by mouth every morning.     OxyCODONE (OXYCONTIN) 10 mg T12A 12 hr tablet Take 1 tablet (10 mg total) by mouth 2 (two) times daily as needed (severe pain). Qty: 60 tablet, Refills: 0    pregabalin (LYRICA) 100 MG capsule Take one  capsule by mouth once daily for pains Qty: 30 capsule, Refills: 5    quinapril (ACCUPRIL) 5 MG tablet Take 5 mg by mouth at bedtime.    rifaximin (XIFAXAN) 550 MG TABS tablet Take 1 tablet (550 mg total) by mouth 2 (two) times daily. Qty: 60 tablet, Refills: 1    spironolactone (ALDACTONE) 50 MG tablet Take 50 mg by mouth every morning.    triamcinolone cream (KENALOG) 0.1 % Apply 1 application topically daily as needed (for irritation).     ursodiol (URSO FORTE) 500 MG tablet Take 500 mg by mouth 2 (two) times daily.      STOP taking these medications     ciprofloxacin (CIPRO) 500 MG tablet        Allergies  Allergen Reactions  . Morphine And Related Itching  . Prochlorperazine Edisylate Other (See Comments)    Causes anxiety/numbness, pt not sure of this allergy  . Compazine [Prochlorperazine Edisylate] Anxiety    Causes anxiety &  nervousness   Follow-up Information   Follow up with Birdie Sons, MD On 08/10/2014. (at 11am)    Specialty:  General Surgery   Contact information:   [address] [place] [id number] [phone]        The results of significant diagnostics from this hospitalization (including imaging, microbiology, ancillary and laboratory) are listed below for reference.    Significant Diagnostic Studies: Dg Chest 2 View  07/12/2014   CLINICAL DATA:  Weakness, altered mental status  EXAM: CHEST  2 VIEW  COMPARISON:  Radiograph 06/26/2014, CT 08/10/2013, radiograph 06/26/2014  FINDINGS: Enlarged cardiac silhouette is unchanged. No effusion, infiltrate, or pneumothorax. 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  IMPRESSION: 1. Cardiomegaly without acute cardiopulmonary findings.   Electronically Signed   By: Suzy Bouchard M.D.   On: 07/12/2014 16:29   Ct Head Wo Contrast  07/12/2014   CLINICAL DATA:  Generalized weakness.  No known injury.  EXAM: CT HEAD WITHOUT CONTRAST  TECHNIQUE:  Contiguous axial images were obtained from the base of the skull through the vertex without intravenous contrast.  COMPARISON:  06/26/2014  FINDINGS: Ventricles are normal in configuration. There is ventricular and sulcal enlargement reflecting mild atrophy.  No parenchymal masses or mass effect. No evidence of a recent infarct. There are mild areas of white matter hypoattenuation most consistent with chronic microvascular ischemic change.  There are no extra-axial masses or abnormal fluid collections.  There is no intracranial hemorrhage.  Visualized sinuses and mastoid air cells are clear. No skull lesion.  IMPRESSION: 1. No acute intracranial abnormalities. No change from the prior study.   Electronically Signed   By: Lajean Manes M.D.   On: 07/12/2014 15:44   Mr Brain Wo Contrast  07/12/2014   CLINICAL DATA:  Left-sided weakness. Headache. Altered mental status.  EXAM: MRI HEAD WITHOUT CONTRAST  TECHNIQUE: Multiplanar, multiecho pulse sequences of the brain and surrounding structures were obtained without intravenous contrast.  COMPARISON:  07/12/2014 head CT.  01/22/2010 brain MR.  FINDINGS: Exam is motion degraded.  No acute infarct.  No intracranial hemorrhage.  Significant white matter changes most suggestive of result of small vessel disease.  Global atrophy without hydrocephalus.  No intracranial mass lesion noted on this unenhanced exam.  Decreased signal intensity of bone marrow may be related to patient's habitus. Correlation with CBC to exclude anemia contributing to this appearance may be considered  Major intracranial vascular structures are patent.  Cervical medullary junction, pituitary region, pineal region and orbital structures unremarkable.  IMPRESSION: Exam is motion degraded.  No acute infarct.  Significant white matter changes most suggestive of result of small vessel disease.  Global atrophy without hydrocephalus.   Electronically Signed   By: Chauncey Cruel M.D.   On: 07/12/2014 19:40    Mr 3d Recon At Scanner  07/29/2014   CLINICAL DATA:  Cirrhosis and portal hypertension. Reported diagnosis of pancreatitis  EXAM: MRI ABDOMEN WITHOUT AND WITH CONTRAST (INCLUDING MRCP)  TECHNIQUE: Multiplanar multisequence MR imaging of the abdomen was performed both before and after the administration of intravenous contrast. Heavily T2-weighted images of the biliary and pancreatic ducts were obtained, and three-dimensional MRCP images were rendered by post processing.  CONTRAST:  4mL MULTIHANCE GADOBENATE DIMEGLUMINE 529 MG/ML IV SOLN  COMPARISON:  The most recent similar comparison exam is abdominal ultrasound 11/19/2013 and CT abdomen/ pelvis 04/26/2013. MRI abdomen 06/29/2011.  FINDINGS: Multiple series are degraded by patient motion. There is mild  to moderate diffuse pancreatic edema and trace peripancreatic fluid. Nodular hepatic contour and splenomegaly reidentified compatible with previously diagnosed cirrhosis and evidence of portal hypertension. Trace pericholecystic fluid and or gallbladder wall thickening reidentified with dependent tiny stones noted. 1.3 cm cyst at the dome of the right hepatic lobe reidentified as well as T2 hyperintense hepatic lesions measuring less than 5 mm statistically most likely cysts or hamartomas.  Bilateral T2 hyperintense renal cortical cysts are reidentified. 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 Adrenal glands are normal. No focal splenic lesion. No intrahepatic, common duct, or pancreatic ductal dilatation. No lymphadenopathy. Visualized bowel is unremarkable.  After administration of contrast, visualization is again suboptimal on multiple series because of patient respiratory motion. Allowing for this, no abnormal enhancement is seen. There is inhomogeneous hypoenhancement of the pancreas.  On heavily T2 weighted sequences, No common duct filling defect, focal cut off, or other abnormality is identified.  IMPRESSION: Findings compatible with  mild-to-moderate pancreatitis without complicating features such as peripancreatic fluid collection to suggest pseudocyst formation.  No common duct filling defect or contour abnormality.  Gallstones with mild gallbladder wall thickening and/or pericholecystic fluid compatible with hypoproteinemia/changes of cirrhosis and portal hypertension. Coincident cholecystitis would be significantly less likely.   Electronically Signed   By: Conchita Paris M.D.   On: 07/29/2014 13:14   US Venous Img Lower Unilateral Left  07/12/2014   CLINICAL DATA:  Left lower extremity pain, edema and weakness. Evaluate for DVT.  EXAM: LEFT LOWER EXTREMITY VENOUS DOPPLER ULTRASOUND  TECHNIQUE: Gray-scale sonography with graded compression, as well as color Doppler and duplex ultrasound were performed to evaluate the lower extremity deep venous systems from the level of the common femoral vein and including the common femoral, femoral, profunda femoral, popliteal and calf veins including the posterior tibial, peroneal and gastrocnemius veins when visible. The superficial great saphenous vein was also interrogated. Spectral Doppler was utilized to evaluate flow at rest and with distal augmentation maneuvers in the common femoral, femoral and popliteal veins.  COMPARISON:  None.  FINDINGS: Common Femoral Vein: No evidence of thrombus. Normal compressibility, respiratory phasicity and response to augmentation.  Saphenofemoral Junction: No evidence of thrombus. Normal compressibility and flow on color Doppler imaging.  Profunda Femoral Vein: No evidence of thrombus. Normal compressibility and flow on color Doppler imaging.  Femoral Vein: No evidence of thrombus. Normal compressibility, respiratory phasicity and response to augmentation.  Popliteal Vein: No evidence of thrombus. Normal compressibility, respiratory phasicity and response to augmentation.  Calf Veins: No evidence of thrombus. Normal compressibility and flow on color Doppler  imaging.  Superficial Great Saphenous Vein: No evidence of thrombus. Normal compressibility and flow on color Doppler imaging.  Venous Reflux:  None.  Other Findings:  None.  IMPRESSION: No evidence of DVT within the left lower extremity.   Electronically Signed   By: Sandi Mariscal M.D.   On: 07/12/2014 16:56   Dg Chest Portable 1 View  07/29/2014   CLINICAL DATA:  Acute onset of chest pain.  EXAM: PORTABLE CHEST - 1 VIEW  COMPARISON:  Chest radiograph performed 07/12/2014  FINDINGS: The lungs are well-aerated. Vascular congestion is noted, without significant pulmonary edema. There is no evidence of focal opacification, pleural effusion or pneumothorax.  The cardiomediastinal silhouette is borderline enlarged. No acute osseous abnormalities are seen. Hardware is partially imaged at the right humeral head.  IMPRESSION: Vascular congestion and borderline cardiomegaly, without significant pulmonary edema.   Electronically Signed   By: Garald Balding  M.D.   On: 07/29/2014 00:23   Mr Abd W/wo Cm/mrcp  07/29/2014   CLINICAL DATA:  Cirrhosis and portal hypertension. Reported diagnosis of pancreatitis  EXAM: MRI ABDOMEN WITHOUT AND WITH CONTRAST (INCLUDING MRCP)  TECHNIQUE: Multiplanar multisequence MR imaging of the abdomen was performed both before and after the administration of intravenous contrast. Heavily T2-weighted images of the biliary and pancreatic ducts were obtained, and three-dimensional MRCP images were rendered by post processing.  CONTRAST:  47mL MULTIHANCE GADOBENATE DIMEGLUMINE 529 MG/ML IV SOLN  COMPARISON:  The most recent similar comparison exam is abdominal ultrasound 11/19/2013 and CT abdomen/ pelvis 04/26/2013. MRI abdomen 06/29/2011.  FINDINGS: Multiple series are degraded by patient motion. There is mild to moderate diffuse pancreatic edema and trace peripancreatic fluid. Nodular hepatic contour and splenomegaly reidentified compatible with previously diagnosed cirrhosis and evidence of  portal hypertension. Trace pericholecystic fluid and or gallbladder wall thickening reidentified with dependent tiny stones noted. 1.3 cm cyst at the dome of the right hepatic lobe reidentified as well as T2 hyperintense hepatic lesions measuring less than 5 mm statistically most likely cysts or hamartomas.  Bilateral T2 hyperintense renal cortical cysts are reidentified. 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 Adrenal glands are normal. No focal splenic lesion. No intrahepatic, common duct, or pancreatic ductal dilatation. No lymphadenopathy. Visualized bowel is unremarkable.  After administration of contrast, visualization is again suboptimal on multiple series because of patient respiratory motion. Allowing for this, no abnormal enhancement is seen. There is inhomogeneous hypoenhancement of the pancreas.  On heavily T2 weighted sequences, No common duct filling defect, focal cut off, or other abnormality is identified.  IMPRESSION: Findings compatible with mild-to-moderate pancreatitis without complicating features such as peripancreatic fluid collection to suggest pseudocyst formation.  No common duct filling defect or contour abnormality.  Gallstones with mild gallbladder wall thickening and/or pericholecystic fluid compatible with hypoproteinemia/changes of cirrhosis and portal hypertension. Coincident cholecystitis would be significantly less likely.   Electronically Signed   By: Conchita Paris M.D.   On: 07/29/2014 13:14    Microbiology: Recent Results (from the past 240 hour(s))  URINE CULTURE     Status: None   Collection Time    07/29/14  3:55 PM      Result Value Ref Range Status   Specimen Description URINE, CLEAN CATCH   Final   Special Requests NONE   Final   Culture  Setup Time     Final   Value: 07/30/2014 00:18     Performed at [facility]     Final   Value: >=100,000 COLONIES/ML     Performed at [facility]   Culture     Final    Value: Multiple bacterial morphotypes present, none predominant. Suggest appropriate recollection if clinically indicated.     Performed at [facility]   Report Status 07/31/2014 FINAL   Final  MRSA PCR SCREENING     Status: None   Collection Time    07/30/14 11:45 AM      Result Value Ref Range Status   MRSA by PCR NEGATIVE  NEGATIVE Final   Comment:            The GeneXpert MRSA Assay (FDA     approved for NASAL specimens     only), is one component of a     comprehensive MRSA colonization     surveillance program. It is not     intended to diagnose MRSA  infection nor to guide or     monitor treatment for     MRSA infections.     Labs: Basic Metabolic Panel:  Recent Labs Lab 07/31/14 0504 08/03/14 1648 08/04/14 0622 08/05/14 0643 08/06/14 0626  NA 136* 139 142 142 144  K 3.7 3.9 3.5* 3.5* 3.8  CL 104 102 106 109 111  CO2 21 25 25 24 25   GLUCOSE 190* 199* 118* 135* 136*  BUN 10 11 12 13 12   CREATININE 0.65 0.75 0.75 0.69 0.73  CALCIUM 8.1* 8.7 8.0* 7.9* 8.0*  MG  --   --   --  1.7  --    Liver Function Tests:  Recent Labs Lab 07/31/14 0504 08/03/14 1648 08/04/14 0622  AST 34 27 26  ALT 42* 22 21  ALKPHOS 113 128* 118*  BILITOT 1.3* 2.3* 1.3*  PROT 5.5* 6.3 5.6*  ALBUMIN 2.8* 3.2* 2.8*    Recent Labs Lab 07/31/14 0504 08/03/14 1648 08/04/14 0622 08/05/14 0643  LIPASE 34 2576* 160* 34   No results found for this basename: AMMONIA,  in the last 168 hours CBC:  Recent Labs Lab 07/31/14 0504 08/03/14 1648 08/04/14 0622 08/05/14 0643 08/06/14 0626  WBC 3.9* 7.8 3.8* 2.5* 2.1*  NEUTROABS  --  6.4  --   --   --   HGB 10.8* 12.6 11.1* 10.4* 10.5*  HCT 31.9* 37.4 33.8* 31.9* 31.5*  MCV 76.5* 77.0* 78.2 78.0 78.6  PLT 99* 140* 130* 105* 111*   Cardiac Enzymes: No results found for this basename: CKTOTAL, CKMB, CKMBINDEX, TROPONINI,  in the last 168 hours BNP: BNP (last 3 results) No results found for this basename: PROBNP,  in  the last 8760 hours CBG:  Recent Labs Lab 08/05/14 1133 08/05/14 1711 08/05/14 2135 08/06/14 0721 08/06/14 1112  GLUCAP 204* 116* 148* 130* 163*       Signed:  BLACK,KAREN M  [facility] 08/06/2014, 2:58 PM

## 2014-08-06 NOTE — Progress Notes (Signed)
Subjective:  Feels better. Tolerating advanced diet. Wants to go home.   Objective: Vital signs in last 24 hours: Temp:  [97.5 F (36.4 C)-99 F (37.2 C)] 97.5 F (36.4 C) (09/25 5638) Pulse Rate:  [52-69] 52 (09/25 0613) Resp:  [16-20] 16 (09/25 7564) BP: (94-116)/(46-67) 104/55 mmHg (09/25 0613) SpO2:  [94 %-98 %] 95 % (09/25 0613) Last BM Date: 08/05/14 General:   Alert,  Well-developed, well-nourished, pleasant and cooperative in NAD Head:  Normocephalic and atraumatic. Eyes:  Sclera clear, no icterus.  Chest: CTA bilaterally without rales, rhonchi, crackles.    Heart:  Regular rate and rhythm; no murmurs, clicks, rubs,  or gallops. Abdomen:  Soft, nontender and nondistended. No masses, hepatosplenomegaly or hernias noted. Normal bowel sounds, without guarding, and without rebound.   Extremities:  Without clubbing, deformity or edema. Neurologic:  Alert and  oriented x4;  grossly normal neurologically. Skin:  Intact without significant lesions or rashes. Psych:  Alert and cooperative. Normal mood and affect.  Intake/Output from previous day: 09/24 0701 - 09/25 0700 In: 600 [P.O.:600] Out: 500 [Urine:500] Intake/Output this shift: Total I/O In: 240 [P.O.:240] Out: -   Lab Results: CBC  Recent Labs  08/04/14 0622 08/05/14 0643 08/06/14 0626  WBC 3.8* 2.5* 2.1*  HGB 11.1* 10.4* 10.5*  HCT 33.8* 31.9* 31.5*  MCV 78.2 78.0 78.6  PLT 130* 105* 111*   BMET  Recent Labs  08/04/14 0622 08/05/14 0643 08/06/14 0626  NA 142 142 144  K 3.5* 3.5* 3.8  CL 106 109 111  CO2 25 24 25   GLUCOSE 118* 135* 136*  BUN 12 13 12   CREATININE 0.75 0.69 0.73  CALCIUM 8.0* 7.9* 8.0*   LFTs  Recent Labs  08/03/14 1648 08/04/14 0622  BILITOT 2.3* 1.3*  ALKPHOS 128* 118*  AST 27 26  ALT 22 21  PROT 6.3 5.6*  ALBUMIN 3.2* 2.8*    Recent Labs  08/03/14 1648 08/04/14 0622 08/05/14 0643  LIPASE 2576* 160* 34   PT/INR  Recent Labs  08/04/14 0622  LABPROT 15.5*   INR 1.23      Imaging Studies:   Mr 3d Recon At Scanner  07/29/2014   CLINICAL DATA:  Cirrhosis and portal hypertension. Reported diagnosis of pancreatitis  EXAM: MRI ABDOMEN WITHOUT AND WITH CONTRAST (INCLUDING MRCP)  TECHNIQUE: Multiplanar multisequence MR imaging of the abdomen was performed both before and after the administration of intravenous contrast. Heavily T2-weighted images of the biliary and pancreatic ducts were obtained, and three-dimensional MRCP images were rendered by post processing.  CONTRAST:  65mL MULTIHANCE GADOBENATE DIMEGLUMINE 529 MG/ML IV SOLN  COMPARISON:  The most recent similar comparison exam is abdominal ultrasound 11/19/2013 and CT abdomen/ pelvis 04/26/2013. MRI abdomen 06/29/2011.  FINDINGS: Multiple series are degraded by patient motion. There is mild to moderate diffuse pancreatic edema and trace peripancreatic fluid. Nodular hepatic contour and splenomegaly reidentified compatible with previously diagnosed cirrhosis and evidence of portal hypertension. Trace pericholecystic fluid and or gallbladder wall thickening reidentified with dependent tiny stones noted. 1.3 cm cyst at the dome of the right hepatic lobe reidentified as well as T2 hyperintense hepatic lesions measuring less than 5 mm statistically most likely cysts or hamartomas.  Bilateral T2 hyperintense renal cortical cysts are reidentified. 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 Adrenal glands are normal. No focal splenic lesion. No intrahepatic, common duct, or pancreatic ductal dilatation. No lymphadenopathy. Visualized bowel is unremarkable.  After administration of contrast, visualization is again suboptimal on multiple  series because of patient respiratory motion. Allowing for this, no abnormal enhancement is seen. There is inhomogeneous hypoenhancement of the pancreas.  On heavily T2 weighted sequences, No common duct filling defect, focal cut off, or other abnormality is identified.   IMPRESSION: Findings compatible with mild-to-moderate pancreatitis without complicating features such as peripancreatic fluid collection to suggest pseudocyst formation.  No common duct filling defect or contour abnormality.  Gallstones with mild gallbladder wall thickening and/or pericholecystic fluid compatible with hypoproteinemia/changes of cirrhosis and portal hypertension. Coincident cholecystitis would be significantly less likely.   Electronically Signed   By: Conchita Paris M.D.   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 Clinically improved.  Child Pugh Class B  MELD [facility] 6  Plan: 1. Our office is scheduling consultation with general surgery at [facility]. We will contact patient with appointment information.  2. OK for discharge from GI standpoint.  3. Low-fat diet.   LOS: 3 days   Neil Crouch  08/06/2014, 9:45 AM

## 2014-08-10 ENCOUNTER — Encounter: Payer: Self-pay | Admitting: Internal Medicine

## 2014-08-16 ENCOUNTER — Telehealth: Payer: Self-pay | Admitting: Internal Medicine

## 2014-08-16 ENCOUNTER — Encounter: Payer: Self-pay | Admitting: Internal Medicine

## 2014-08-16 NOTE — Telephone Encounter (Signed)
Discussed w Dr, Eugenia Pancoast today.  Risks of surgery significant as previously stated (probably somewhere between 10-30% - mortality).  I called pt /spose this evening.  They have already concluded that the benefits of surgery may not be worth the  Risks.  They want to take a wait and see approach. I have recommended she continue Actigall daily.  Follow-up appt w [place] in the near future.

## 2014-08-17 ENCOUNTER — Encounter (HOSPITAL_COMMUNITY): Payer: Self-pay | Admitting: Emergency Medicine

## 2014-08-17 ENCOUNTER — Emergency Department (HOSPITAL_COMMUNITY): Payer: Medicare Other

## 2014-08-17 ENCOUNTER — Inpatient Hospital Stay (HOSPITAL_COMMUNITY)
Admission: EM | Admit: 2014-08-17 | Discharge: 2014-08-19 | DRG: 441 | Disposition: A | Discharge status: Skilled Nursing Facility | Payer: Medicare Other | Attending: Internal Medicine | Admitting: Internal Medicine

## 2014-08-17 DIAGNOSIS — Z7982 Long term (current) use of aspirin: Secondary | ICD-10-CM | POA: Diagnosis not present

## 2014-08-17 DIAGNOSIS — K7460 Unspecified cirrhosis of liver: Secondary | ICD-10-CM | POA: Diagnosis present

## 2014-08-17 DIAGNOSIS — Z8249 Family history of ischemic heart disease and other diseases of the circulatory system: Secondary | ICD-10-CM | POA: Diagnosis not present

## 2014-08-17 DIAGNOSIS — Z9119 Patient's noncompliance with other medical treatment and regimen: Secondary | ICD-10-CM | POA: Diagnosis present

## 2014-08-17 DIAGNOSIS — Z833 Family history of diabetes mellitus: Secondary | ICD-10-CM

## 2014-08-17 DIAGNOSIS — Z794 Long term (current) use of insulin: Secondary | ICD-10-CM | POA: Diagnosis not present

## 2014-08-17 DIAGNOSIS — E785 Hyperlipidemia, unspecified: Secondary | ICD-10-CM | POA: Diagnosis present

## 2014-08-17 DIAGNOSIS — D696 Thrombocytopenia, unspecified: Secondary | ICD-10-CM | POA: Diagnosis present

## 2014-08-17 DIAGNOSIS — Z599 Problem related to housing and economic circumstances, unspecified: Secondary | ICD-10-CM

## 2014-08-17 DIAGNOSIS — F418 Other specified anxiety disorders: Secondary | ICD-10-CM | POA: Diagnosis present

## 2014-08-17 DIAGNOSIS — G629 Polyneuropathy, unspecified: Secondary | ICD-10-CM | POA: Diagnosis present

## 2014-08-17 DIAGNOSIS — R4182 Altered mental status, unspecified: Secondary | ICD-10-CM | POA: Diagnosis present

## 2014-08-17 DIAGNOSIS — I2510 Atherosclerotic heart disease of native coronary artery without angina pectoris: Secondary | ICD-10-CM | POA: Diagnosis present

## 2014-08-17 DIAGNOSIS — R531 Weakness: Secondary | ICD-10-CM

## 2014-08-17 DIAGNOSIS — Z90710 Acquired absence of both cervix and uterus: Secondary | ICD-10-CM

## 2014-08-17 DIAGNOSIS — K7581 Nonalcoholic steatohepatitis (NASH): Secondary | ICD-10-CM | POA: Diagnosis present

## 2014-08-17 DIAGNOSIS — K7290 Hepatic failure, unspecified without coma: Secondary | ICD-10-CM | POA: Diagnosis not present

## 2014-08-17 DIAGNOSIS — E46 Unspecified protein-calorie malnutrition: Secondary | ICD-10-CM | POA: Diagnosis present

## 2014-08-17 DIAGNOSIS — R410 Disorientation, unspecified: Secondary | ICD-10-CM

## 2014-08-17 DIAGNOSIS — K219 Gastro-esophageal reflux disease without esophagitis: Secondary | ICD-10-CM | POA: Diagnosis present

## 2014-08-17 DIAGNOSIS — K7682 Hepatic encephalopathy: Secondary | ICD-10-CM

## 2014-08-17 DIAGNOSIS — J45909 Unspecified asthma, uncomplicated: Secondary | ICD-10-CM | POA: Diagnosis present

## 2014-08-17 DIAGNOSIS — K2100 Gastro-esophageal reflux disease with esophagitis, without bleeding: Secondary | ICD-10-CM

## 2014-08-17 DIAGNOSIS — Z79899 Other long term (current) drug therapy: Secondary | ICD-10-CM | POA: Diagnosis not present

## 2014-08-17 DIAGNOSIS — N390 Urinary tract infection, site not specified: Secondary | ICD-10-CM | POA: Diagnosis present

## 2014-08-17 DIAGNOSIS — E1142 Type 2 diabetes mellitus with diabetic polyneuropathy: Secondary | ICD-10-CM

## 2014-08-17 DIAGNOSIS — R627 Adult failure to thrive: Secondary | ICD-10-CM | POA: Diagnosis present

## 2014-08-17 DIAGNOSIS — G8929 Other chronic pain: Secondary | ICD-10-CM | POA: Diagnosis present

## 2014-08-17 DIAGNOSIS — R262 Difficulty in walking, not elsewhere classified: Secondary | ICD-10-CM

## 2014-08-17 DIAGNOSIS — E119 Type 2 diabetes mellitus without complications: Secondary | ICD-10-CM | POA: Diagnosis present

## 2014-08-17 DIAGNOSIS — G9341 Metabolic encephalopathy: Secondary | ICD-10-CM | POA: Diagnosis present

## 2014-08-17 DIAGNOSIS — E876 Hypokalemia: Secondary | ICD-10-CM | POA: Diagnosis present

## 2014-08-17 DIAGNOSIS — I10 Essential (primary) hypertension: Secondary | ICD-10-CM | POA: Diagnosis present

## 2014-08-17 DIAGNOSIS — R5381 Other malaise: Secondary | ICD-10-CM | POA: Diagnosis present

## 2014-08-17 DIAGNOSIS — G9340 Encephalopathy, unspecified: Secondary | ICD-10-CM

## 2014-08-17 DIAGNOSIS — E1165 Type 2 diabetes mellitus with hyperglycemia: Secondary | ICD-10-CM

## 2014-08-17 DIAGNOSIS — IMO0002 Reserved for concepts with insufficient information to code with codable children: Secondary | ICD-10-CM

## 2014-08-17 LAB — CBC WITH DIFFERENTIAL/PLATELET
Basophils Absolute: 0 10*3/uL (ref 0.0–0.1)
Basophils Relative: 1 % (ref 0–1)
EOS ABS: 0.1 10*3/uL (ref 0.0–0.7)
EOS PCT: 2 % (ref 0–5)
HCT: 38.1 % (ref 36.0–46.0)
HEMOGLOBIN: 12.3 g/dL (ref 12.0–15.0)
Lymphocytes Relative: 19 % (ref 12–46)
Lymphs Abs: 0.8 10*3/uL (ref 0.7–4.0)
MCH: 25.2 pg — AB (ref 26.0–34.0)
MCHC: 32.3 g/dL (ref 30.0–36.0)
MCV: 78.1 fL (ref 78.0–100.0)
MONOS PCT: 9 % (ref 3–12)
Monocytes Absolute: 0.4 10*3/uL (ref 0.1–1.0)
Neutro Abs: 3.1 10*3/uL (ref 1.7–7.7)
Neutrophils Relative %: 69 % (ref 43–77)
PLATELETS: 138 10*3/uL — AB (ref 150–400)
RBC: 4.88 MIL/uL (ref 3.87–5.11)
RDW: 14.6 % (ref 11.5–15.5)
WBC: 4.4 10*3/uL (ref 4.0–10.5)

## 2014-08-17 LAB — COMPREHENSIVE METABOLIC PANEL
ALBUMIN: 3.1 g/dL — AB (ref 3.5–5.2)
ALT: 14 U/L (ref 0–35)
ANION GAP: 13 (ref 5–15)
AST: 26 U/L (ref 0–37)
Alkaline Phosphatase: 112 U/L (ref 39–117)
BILIRUBIN TOTAL: 1.6 mg/dL — AB (ref 0.3–1.2)
BUN: 15 mg/dL (ref 6–23)
CHLORIDE: 102 meq/L (ref 96–112)
CO2: 25 mEq/L (ref 19–32)
CREATININE: 0.63 mg/dL (ref 0.50–1.10)
Calcium: 8.8 mg/dL (ref 8.4–10.5)
GFR calc Af Amer: >90 mL/min (ref >90)
GFR, EST NON AFRICAN AMERICAN: 88 mL/min — AB (ref >90)
Glucose, Bld: 200 mg/dL — ABNORMAL HIGH (ref 70–99)
Potassium: 3.9 mEq/L (ref 3.7–5.3)
Sodium: 140 mEq/L (ref 137–147)
Total Protein: 6.1 g/dL (ref 6.0–8.3)

## 2014-08-17 LAB — URINALYSIS, ROUTINE W REFLEX MICROSCOPIC
Glucose, UA: NEGATIVE mg/dL
HGB URINE DIPSTICK: NEGATIVE
Nitrite: POSITIVE — AB
Protein, ur: NEGATIVE mg/dL
SPECIFIC GRAVITY, URINE: 1.025 (ref 1.005–1.030)
UROBILINOGEN UA: 1 mg/dL (ref 0.0–1.0)
pH: 6 (ref 5.0–8.0)

## 2014-08-17 LAB — URINE MICROSCOPIC-ADD ON

## 2014-08-17 LAB — AMMONIA: AMMONIA: 140 umol/L — AB (ref 11–60)

## 2014-08-17 LAB — PROTIME-INR
INR: 1.21 (ref 0.00–1.49)
Prothrombin Time: 15.3 seconds — ABNORMAL HIGH (ref 11.6–15.2)

## 2014-08-17 LAB — GLUCOSE, CAPILLARY: GLUCOSE-CAPILLARY: 220 mg/dL — AB (ref 70–99)

## 2014-08-17 LAB — TROPONIN I

## 2014-08-17 LAB — APTT: aPTT: 29 seconds (ref 24–37)

## 2014-08-17 MED ORDER — INSULIN DETEMIR 100 UNIT/ML ~~LOC~~ SOLN
30.0000 [IU] | Freq: Every day | SUBCUTANEOUS | Status: DC
  Administered 2014-08-17 – 2014-08-18 (×2): 30 [IU] via SUBCUTANEOUS
  Filled 2014-08-17 (×3): qty 0.3

## 2014-08-17 MED ORDER — MECLIZINE HCL 12.5 MG PO TABS: 25.0000 mg | ORAL_TABLET | Freq: Three times a day (TID) | ORAL | Status: DC | PRN

## 2014-08-17 MED ORDER — HEPARIN SODIUM (PORCINE) 5000 UNIT/ML IJ SOLN
5000.0000 [IU] | Freq: Three times a day (TID) | INTRAMUSCULAR | Status: DC
  Administered 2014-08-17 – 2014-08-19 (×6): 5000 [IU] via SUBCUTANEOUS
  Filled 2014-08-17 (×6): qty 1

## 2014-08-17 MED ORDER — RIFAXIMIN 550 MG PO TABS
550.0000 mg | ORAL_TABLET | Freq: Two times a day (BID) | ORAL | Status: DC
  Administered 2014-08-17 – 2014-08-19 (×4): 550 mg via ORAL
  Filled 2014-08-17 (×7): qty 1

## 2014-08-17 MED ORDER — BUDESONIDE-FORMOTEROL FUMARATE 160-4.5 MCG/ACT IN AERO
2.0000 | INHALATION_SPRAY | Freq: Every day | RESPIRATORY_TRACT | Status: DC
Start: 2014-08-17 — End: 2014-08-19
  Administered 2014-08-17 – 2014-08-18 (×2): 2 via RESPIRATORY_TRACT
  Filled 2014-08-17: qty 6

## 2014-08-17 MED ORDER — URSODIOL 300 MG PO CAPS
ORAL_CAPSULE | ORAL | Status: AC
  Filled 2014-08-17: qty 1

## 2014-08-17 MED ORDER — OXYCODONE HCL ER 10 MG PO T12A
10.0000 mg | EXTENDED_RELEASE_TABLET | Freq: Two times a day (BID) | ORAL | Status: DC | PRN
  Administered 2014-08-17 – 2014-08-19 (×3): 10 mg via ORAL
  Filled 2014-08-17 (×3): qty 1

## 2014-08-17 MED ORDER — URSODIOL 300 MG PO CAPS
300.0000 mg | ORAL_CAPSULE | Freq: Two times a day (BID) | ORAL | Status: DC
  Administered 2014-08-17 – 2014-08-19 (×4): 300 mg via ORAL
  Filled 2014-08-17 (×8): qty 1

## 2014-08-17 MED ORDER — RIFAXIMIN 550 MG PO TABS
ORAL_TABLET | ORAL | Status: AC
  Filled 2014-08-17: qty 1

## 2014-08-17 MED ORDER — INSULIN ASPART 100 UNIT/ML ~~LOC~~ SOLN
0.0000 [IU] | Freq: Three times a day (TID) | SUBCUTANEOUS | Status: DC
  Administered 2014-08-18: 1 [IU] via SUBCUTANEOUS
  Administered 2014-08-18 – 2014-08-19 (×4): 2 [IU] via SUBCUTANEOUS

## 2014-08-17 MED ORDER — INSULIN ASPART 100 UNIT/ML ~~LOC~~ SOLN
0.0000 [IU] | Freq: Every day | SUBCUTANEOUS | Status: DC
  Administered 2014-08-17: 2 [IU] via SUBCUTANEOUS

## 2014-08-17 MED ORDER — SODIUM CHLORIDE 0.9 % IV BOLUS (SEPSIS)
700.0000 mL | Freq: Once | INTRAVENOUS | Status: AC
  Administered 2014-08-17: 700 mL via INTRAVENOUS

## 2014-08-17 MED ORDER — PANTOPRAZOLE SODIUM 40 MG PO TBEC
80.0000 mg | DELAYED_RELEASE_TABLET | Freq: Every day | ORAL | Status: DC
  Administered 2014-08-18 – 2014-08-19 (×2): 80 mg via ORAL
  Filled 2014-08-17 (×2): qty 2

## 2014-08-17 MED ORDER — ASPIRIN EC 325 MG PO TBEC
325.0000 mg | DELAYED_RELEASE_TABLET | Freq: Every morning | ORAL | Status: DC
  Administered 2014-08-18 – 2014-08-19 (×2): 325 mg via ORAL
  Filled 2014-08-17 (×2): qty 1

## 2014-08-17 MED ORDER — ONDANSETRON HCL 4 MG/2ML IJ SOLN: 4.0000 mg | Freq: Four times a day (QID) | INTRAMUSCULAR | Status: DC | PRN

## 2014-08-17 MED ORDER — SODIUM CHLORIDE 0.45 % IV SOLN
INTRAVENOUS | Status: DC
  Administered 2014-08-17 – 2014-08-18 (×2): via INTRAVENOUS

## 2014-08-17 MED ORDER — ONDANSETRON HCL 4 MG PO TABS: 4.0000 mg | ORAL_TABLET | Freq: Four times a day (QID) | ORAL | Status: DC | PRN

## 2014-08-17 MED ORDER — FUROSEMIDE 20 MG PO TABS
20.0000 mg | ORAL_TABLET | Freq: Every morning | ORAL | Status: DC
  Administered 2014-08-18 – 2014-08-19 (×2): 20 mg via ORAL
  Filled 2014-08-17 (×2): qty 1

## 2014-08-17 MED ORDER — BUDESONIDE-FORMOTEROL FUMARATE 160-4.5 MCG/ACT IN AERO
INHALATION_SPRAY | RESPIRATORY_TRACT | Status: AC
  Filled 2014-08-17: qty 6

## 2014-08-17 MED ORDER — ALPRAZOLAM 0.5 MG PO TABS
0.5000 mg | ORAL_TABLET | Freq: Three times a day (TID) | ORAL | Status: DC | PRN
  Administered 2014-08-17 – 2014-08-18 (×2): 0.5 mg via ORAL
  Filled 2014-08-17 (×2): qty 1

## 2014-08-17 MED ORDER — SPIRONOLACTONE 25 MG PO TABS
50.0000 mg | ORAL_TABLET | Freq: Every morning | ORAL | Status: DC
  Administered 2014-08-18 – 2014-08-19 (×2): 50 mg via ORAL
  Filled 2014-08-17: qty 2
  Filled 2014-08-17 (×2): qty 1
  Filled 2014-08-17: qty 2

## 2014-08-17 MED ORDER — LACTULOSE 10 GM/15ML PO SOLN
30.0000 g | Freq: Three times a day (TID) | ORAL | Status: DC
  Administered 2014-08-17 – 2014-08-19 (×5): 30 g via ORAL
  Filled 2014-08-17 (×2): qty 45
  Filled 2014-08-17: qty 60
  Filled 2014-08-17 (×3): qty 45
  Filled 2014-08-17 (×4): qty 60

## 2014-08-17 MED ORDER — URSODIOL 500 MG PO TABS: 500.0000 mg | ORAL_TABLET | Freq: Two times a day (BID) | ORAL | Status: DC

## 2014-08-17 MED ORDER — DEXTROSE 5 % IV SOLN
1.0000 g | Freq: Once | INTRAVENOUS | Status: AC
  Administered 2014-08-17: 1 g via INTRAVENOUS
  Filled 2014-08-17: qty 10

## 2014-08-17 MED ORDER — CEPHALEXIN 500 MG PO CAPS
500.0000 mg | ORAL_CAPSULE | Freq: Three times a day (TID) | ORAL | Status: DC
  Administered 2014-08-17 – 2014-08-19 (×5): 500 mg via ORAL
  Filled 2014-08-17 (×5): qty 1

## 2014-08-17 MED ORDER — LISINOPRIL 5 MG PO TABS
5.0000 mg | ORAL_TABLET | Freq: Every day | ORAL | Status: DC
  Administered 2014-08-18 – 2014-08-19 (×2): 5 mg via ORAL
  Filled 2014-08-17 (×2): qty 1

## 2014-08-17 MED ORDER — PREGABALIN 50 MG PO CAPS
100.0000 mg | ORAL_CAPSULE | Freq: Two times a day (BID) | ORAL | Status: DC
  Administered 2014-08-17 – 2014-08-19 (×4): 100 mg via ORAL
  Filled 2014-08-17 (×4): qty 2

## 2014-08-17 MED ORDER — SODIUM CHLORIDE 0.9 % IV SOLN
INTRAVENOUS | Status: DC
  Administered 2014-08-17: 15:00:00 via INTRAVENOUS

## 2014-08-17 NOTE — ED Notes (Addendum)
Pt progressively getting weaker per husband, hx of same and could be ammonia high due to cirrhosis of liver or UTI

## 2014-08-17 NOTE — ED Provider Notes (Signed)
CSN: [id number]     Arrival date & time 08/17/14  1352 History   This chart was scribed for Janice Norrie, MD, by Neta Ehlers, ED Scribe. This patient was seen in room APA08/APA08 and the patient's care was started at 2:36 PM.  First MD Initiated Contact with Patient 08/17/14 1422     Chief Complaint  Patient presents with  . Altered Mental Status    HPI HPI Comments: [name] [name] is a 73 y.o. female who presents to the Emergency Department with her husband who is complaining of increasing weakness which began four days ago and has interfered with daily activities of living such as transitioning from her wheelchair to the bedside commode. The pt also endorses malodorous urine, urinary hesitancy, a subjective fever,  and a decreased appetite the past few days.  The pt's husband also endorses confusion increased from baseline; he provided the example that she becomes confused when attempting to dial a phone and couldn't figure out how to dial the number.  At bedside, the pt said the day was Tuesday, the month September, and the year 2020. She did not know her husband's birthday, but she recalled her birthday and her phone number.   The pt denies a headache, chest pain, or abdominal pain. She endorses right arm pain which has occurred intermittently since she fractured the arm in 02/2012; her husband states she has not been able to have follow-up orthopedic surgery due to health issues.   Her husband voices a h/o UTI, dehydration, and hepatic encephalopathy.   The pt has been in a wheelchair for approximately a year; her husband states she has diabetic neuropathy. The pt takes lactulose due to cirrhosis; she has approximately three BMs a day. She has not taken the lactolose today, nor has she had a BM today.   Her husband also endorses generalized left-sided weakness, onset several months ago. Per medical records, the pt had an MRI two months ago in August 2015.   The pt is a non-smoker. She  abstains from alcohol   Dr. Hilma Favors is the pt's PCP.   Past Medical History  Diagnosis Date  . CAD in native artery   . Thrombocytopenia, unspecified   . Unspecified hereditary and idiopathic peripheral neuropathy   . Unspecified fall   . Other and unspecified hyperlipidemia   . Unspecified essential hypertension   . Obesity, unspecified   . Personal history of neurosis   . Intrinsic asthma, unspecified   . Allergic rhinitis, cause unspecified   . Cirrhosis of liver     NASH, afp on 06/17/12 =3.7, per pt she had hep B vaccines in 1996, hep A in process.   . Colon adenomas 03/08/10    tcs by Dr. Gala Romney  . Hyperplastic polyps of stomach 03/08/10    tcs by Dr. Dudley Major  . Chronic gastritis 03/09/11    egd by Dr. Gala Romney  . History of hemorrhoids 03/08/10    tcs- internal and external  . Diverticula of colon 03/08/10    L side  . Esophagitis, erosive 03/08/10  . GERD (gastroesophageal reflux disease)   . Wears glasses   . Type II or unspecified type diabetes mellitus without mention of complication, not stated as uncontrolled   . Vertigo   . Pancreatitis   . Bilateral leg weakness   . Thrombocytopenia 04/12/2014  . Pancytopenia, acquired 04/12/2014  . Cirrhosis   . Abnormal EKG     hx of left anterior fasicular block on 04-09-13  ekg epic   Past Surgical History  Procedure Laterality Date  . Hysterectomy and btl      s/p  . Tumor excision  2003    rt arm and left foot  . Colonoscopy  03/08/10    Dr. [name]-->ext/int hemorrhoids, anal paipilla, rectal polyps, desc polyps, cecal polyp, left-sided diverticula. suboptiomal prep. next TCS 02/2015. multiple adenomas  . Esophagogastroduodenoscopy  03/08/10    Dr. [name] erosive RE, small hh, antral erosions, small 1cm are of mucosal indentation along gastric body of doubtful significance, cystic nodularity of hypophyarynx, base of tongue, base of epiglottis. benign gastric biopsies  . Tubal ligation    . Esophagogastroduodenoscopy  10/08/2011     Dr. [name] esophageal varices, antral erosion. next egd 09/2013  . Foot surgery  2003    lt foot  . Eye surgery      cataracts bilateral  . Orif humerus fracture Right 02/17/2013    Procedure: OPEN REDUCTION INTERNAL FIXATION (ORIF) RIGHT PROXIMAL HUMERUS FRACTURE;  Surgeon: [name], MD;  Location: [facility];  Service: Orthopedics;  Laterality: Right;  . Partial gastrectomy      family denies  . Esophagogastroduodenoscopy N/A 02/17/2014    Dr. Gala Romney: portal gastropathy, no varices. Due for surveillance 2017  . Breast surgery Right few yrs ago    benign area removed  . Abdominal hysterectomy    . Cardiac catheterization  02/25/2007    Dr. [name]:  normal LV systolic function, mild irregularities of LAD   Family History  Problem Relation Age of Onset  . Coronary artery disease      FH  . Diabetes      FH  . Heart disease Mother   . Colon cancer Neg Hx    History  Substance Use Topics  . Smoking status: Never Smoker   . Smokeless tobacco: Never Used  . Alcohol Use: No   Lives at home Lives with spouse  No OB history provided.  Review of Systems  Unable to perform ROS: Mental status change  Constitutional: Positive for fever and appetite change.  Cardiovascular: Negative for chest pain.  Gastrointestinal: Negative for abdominal pain.  Genitourinary: Positive for difficulty urinating. Negative for dysuria.  Musculoskeletal: Positive for arthralgias.  Neurological: Positive for weakness. Negative for headaches.    Allergies  Morphine and related; Prochlorperazine edisylate; and Compazine  Home Medications   Prior to Admission medications   Medication Sig Start Date End Date Taking? Authorizing Provider  ALPRAZolam Duanne Moron) 0.5 MG tablet Take 0.5 mg by mouth 3 (three) times daily as needed for anxiety.   Yes Historical Provider, MD  aspirin EC 325 MG EC tablet Take 1 tablet (325 mg total) by mouth every morning. 07/14/14  Yes Nita Sells, MD   budesonide-formoterol (SYMBICORT) 160-4.5 MCG/ACT inhaler Inhale 2 puffs into the lungs at bedtime.    Yes Historical Provider, MD  ESTRACE VAGINAL 0.1 MG/GM vaginal cream Place 1 Applicatorful vaginally every other day. At  bedtime 08/11/13  Yes Historical Provider, MD  furosemide (LASIX) 20 MG tablet Take 20 mg by mouth every morning.   Yes Historical Provider, MD  insulin aspart (NOVOLOG FLEXPEN) 100 UNIT/ML FlexPen Inject 2-10 Units into the skin 3 (three) times daily with meals. Sliding Scale: 200-250= 2 units 251-300= 4 units 301-350= 6 units 351-400= 8 units If levels are over 400, GIVE 10 UNITS AND CALL MD   Yes Historical Provider, MD  insulin detemir (LEVEMIR) 100 UNIT/ML injection Inject 60 Units into the skin at  bedtime.    Yes Historical Provider, MD  lactulose, encephalopathy, (GENERLAC) 10 GM/15ML SOLN Take 45 mLs (30 g total) by mouth 3 (three) times daily. 06/28/14  Yes Kathie Dike, MD  Multiple Vitamins-Minerals (CENTRUM SILVER PO) Take 1 tablet by mouth daily.    Yes Historical Provider, MD  NEXIUM 40 MG capsule Take 40 mg by mouth every morning.  06/17/14  Yes Historical Provider, MD  OxyCODONE (OXYCONTIN) 10 mg T12A 12 hr tablet Take 1 tablet (10 mg total) by mouth 2 (two) times daily as needed (severe pain). 07/14/14  Yes Nita Sells, MD  pregabalin (LYRICA) 100 MG capsule Take 100 mg by mouth 2 (two) times daily.   Yes Historical Provider, MD  quinapril (ACCUPRIL) 5 MG tablet Take 5 mg by mouth at bedtime.   Yes Historical Provider, MD  rifaximin (XIFAXAN) 550 MG TABS tablet Take 1 tablet (550 mg total) by mouth 2 (two) times daily. 06/28/14  Yes Kathie Dike, MD  spironolactone (ALDACTONE) 50 MG tablet Take 50 mg by mouth every morning.   Yes Historical Provider, MD  triamcinolone cream (KENALOG) 0.1 % Apply 1 application topically daily as needed (for irritation).  06/26/13  Yes Historical Provider, MD  ursodiol (URSO FORTE) 500 MG tablet Take 500 mg by mouth 2  (two) times daily.   Yes Historical Provider, MD  meclizine (ANTIVERT) 25 MG tablet Take 25 mg by mouth 3 (three) times daily as needed for dizziness or nausea.     Historical Provider, MD  ondansetron (ZOFRAN) 4 MG tablet Take 1 tablet (4 mg total) by mouth every 6 (six) hours as needed for nausea. 08/06/14   Radene Gunning, NP   Triage Vitals: BP 138/57  Pulse 67  Temp(Src) 97.5 F (36.4 C) (Oral)  Resp 15  Ht 5\' 8"  (1.727 m)  Wt 232 lb (105.235 kg)  BMI 35.28 kg/m2  SpO2 99%  Vital signs normal    Physical Exam  Nursing note and vitals reviewed. Constitutional: She appears well-developed and well-nourished.  Non-toxic appearance. She does not appear ill. No distress.  HENT:  Head: Normocephalic and atraumatic.  Right Ear: External ear normal.  Left Ear: External ear normal.  Nose: Nose normal. No mucosal edema or rhinorrhea.  Mouth/Throat: Oropharynx is clear and moist and mucous membranes are normal. No dental abscesses or uvula swelling.  Eyes: Conjunctivae and EOM are normal. Pupils are equal, round, and reactive to light.  Neck: Normal range of motion and full passive range of motion without pain. Neck supple. No tracheal deviation present.  Cardiovascular: Normal rate, regular rhythm and normal heart sounds.  Exam reveals no gallop and no friction rub.   No murmur heard. Pulmonary/Chest: Effort normal and breath sounds normal. No respiratory distress. She has no wheezes. She has no rhonchi. She has no rales. She exhibits no tenderness and no crepitus.  Abdominal: Soft. Normal appearance and bowel sounds are normal. She exhibits no distension. There is no tenderness. There is no rebound and no guarding.  Musculoskeletal: Normal range of motion. She exhibits no edema and no tenderness.  Moves all extremities well.   Neurological: She is alert. She has normal strength. No cranial nerve deficit.  Pt cannot move eyebrows, otherwise no asymmetry of face noted  Confused to the  year, month, husband's birthday. The pt knows her own birthday and the home phone number.   Skin: Skin is warm, dry and intact. No rash noted. No erythema. No pallor.  Psychiatric: She has a  normal mood and affect. Her mood appears not anxious. Her speech is delayed. She is slowed.    ED Course  Procedures (including critical care time)  Medications  0.9 %  sodium chloride infusion ( Intravenous New Bag/Given 08/17/14 1458)  sodium chloride 0.9 % bolus 700 mL (700 mLs Intravenous Bolus from Bag 08/17/14 1458)  cefTRIAXone (ROCEPHIN) 1 g in dextrose 5 % 50 mL IVPB (1 g Intravenous New Bag/Given 08/17/14 1603)     DIAGNOSTIC STUDIES: Oxygen Saturation is 99% on room air, normal by my interpretation.    COORDINATION OF CARE:  2:50 PM- Discussed treatment plan with patient, and the patient agreed to the plan. The plan includes IV fluids, lab work, and a CT head.   The patient was started on IV Rocephin for her probable UTI.  1610 discuss with family that her test results. They're agreeable to admission.  17:14 Dr Marily Memos, admit to med-surg, team 1  Labs Review Results for orders placed during the hospital encounter of 08/17/14  URINALYSIS, ROUTINE W REFLEX MICROSCOPIC      Result Value Ref Range   Color, Urine YELLOW  YELLOW   APPearance CLEAR  CLEAR   Specific Gravity, Urine 1.025  1.005 - 1.030   pH 6.0  5.0 - 8.0   Glucose, UA NEGATIVE  NEGATIVE mg/dL   Hgb urine dipstick NEGATIVE  NEGATIVE   Bilirubin Urine SMALL (*) NEGATIVE   Ketones, ur TRACE (*) NEGATIVE mg/dL   Protein, ur NEGATIVE  NEGATIVE mg/dL   Urobilinogen, UA 1.0  0.0 - 1.0 mg/dL   Nitrite POSITIVE (*) NEGATIVE   Leukocytes, UA SMALL (*) NEGATIVE  CBC WITH DIFFERENTIAL      Result Value Ref Range   WBC 4.4  4.0 - 10.5 K/uL   RBC 4.88  3.87 - 5.11 MIL/uL   Hemoglobin 12.3  12.0 - 15.0 g/dL   HCT 38.1  36.0 - 46.0 %   MCV 78.1  78.0 - 100.0 fL   MCH 25.2 (*) 26.0 - 34.0 pg   MCHC 32.3  30.0 - 36.0 g/dL    RDW 14.6  11.5 - 15.5 %   Platelets 138 (*) 150 - 400 K/uL   Neutrophils Relative % 69  43 - 77 %   Neutro Abs 3.1  1.7 - 7.7 K/uL   Lymphocytes Relative 19  12 - 46 %   Lymphs Abs 0.8  0.7 - 4.0 K/uL   Monocytes Relative 9  3 - 12 %   Monocytes Absolute 0.4  0.1 - 1.0 K/uL   Eosinophils Relative 2  0 - 5 %   Eosinophils Absolute 0.1  0.0 - 0.7 K/uL   Basophils Relative 1  0 - 1 %   Basophils Absolute 0.0  0.0 - 0.1 K/uL  AMMONIA      Result Value Ref Range   Ammonia 140 (*) 11 - 60 umol/L  COMPREHENSIVE METABOLIC PANEL      Result Value Ref Range   Sodium 140  137 - 147 mEq/L   Potassium 3.9  3.7 - 5.3 mEq/L   Chloride 102  96 - 112 mEq/L   CO2 25  19 - 32 mEq/L   Glucose, Bld 200 (*) 70 - 99 mg/dL   BUN 15  6 - 23 mg/dL   Creatinine, Ser 0.63  0.50 - 1.10 mg/dL   Calcium 8.8  8.4 - 10.5 mg/dL   Total Protein 6.1  6.0 - 8.3 g/dL   Albumin 3.1 (*)  3.5 - 5.2 g/dL   AST 26  0 - 37 U/L   ALT 14  0 - 35 U/L   Alkaline Phosphatase 112  39 - 117 U/L   Total Bilirubin 1.6 (*) 0.3 - 1.2 mg/dL   GFR calc non Af Amer 88 (*) >90 mL/min   GFR calc Af Amer >90  >90 mL/min   Anion gap 13  5 - 15  APTT      Result Value Ref Range   aPTT 29  24 - 37 seconds  PROTIME-INR      Result Value Ref Range   Prothrombin Time 15.3 (*) 11.6 - 15.2 seconds   INR 1.21  0.00 - 1.49  TROPONIN I      Result Value Ref Range   Troponin I <0.30  <0.30 ng/mL  URINE MICROSCOPIC-ADD ON      Result Value Ref Range   Squamous Epithelial / LPF FEW (*) RARE   WBC, UA 3-6  <3 WBC/hpf   Bacteria, UA MANY (*) RARE    Laboratory interpretation all normal except high ammonia level even from her highest levels before, possible UTI    Imaging Review Ct Head Wo Contrast  08/17/2014   CLINICAL DATA:  Increased confusion, weakness  EXAM: CT HEAD WITHOUT CONTRAST  TECHNIQUE: Contiguous axial images were obtained from the base of the skull through the vertex without intravenous contrast.  COMPARISON:  07/12/2014   FINDINGS: No skull fracture is noted. No intracranial hemorrhage, mass effect or midline shift. Paranasal sinuses and mastoid air cells are unremarkable. Stable cerebral atrophy. No acute cortical infarction. No mass lesion is noted on this unenhanced scan. Stable chronic mild white matter disease.  IMPRESSION: No acute intracranial abnormality.  Stable cerebral atrophy.   Electronically Signed   By: Lahoma Crocker M.D.   On: 08/17/2014 15:30    Mr 3d Recon At Scanner Mr Abd W/wo Cm/mrcp  07/29/2014   CLINICAL DATA:  Cirrhosis and portal hypertension. Reported diagnosis of pancreatitis   IMPRESSION: Findings compatible with mild-to-moderate pancreatitis without complicating features such as peripancreatic fluid collection to suggest pseudocyst formation.  No common duct filling defect or contour abnormality.  Gallstones with mild gallbladder wall thickening and/or pericholecystic fluid compatible with hypoproteinemia/changes of cirrhosis and portal hypertension. Coincident cholecystitis would be significantly less likely.   Electronically Signed   By: Conchita Paris M.D.   On: 07/29/2014 13:14   Dg Chest Portable 1 View  07/29/2014   CLINICAL DATA:  Acute onset of chest pain. Marland Kitchen  IMPRESSION: Vascular congestion and borderline cardiomegaly, without significant pulmonary edema.   Electronically Signed   By: Garald Balding M.D.   On: 07/29/2014 00:23      EKG Interpretation   Date/Time:  Tuesday August 17 2014 15:03:23 EDT Ventricular Rate:  63 PR Interval:  200 QRS Duration: 108 QT Interval:  453 QTC Calculation: 464 R Axis:   -54 Text Interpretation:  Sinus rhythm Left anterior fascicular block  Borderline low voltage, extremity leads Abnormal R-wave progression, late  transition No significant change since last tracing 12 Jul 2014 Confirmed  by Marvene Strohm  MD-I, Eda Magnussen ([id number]) on 08/17/2014 3:21:35 PM      MDM   Final diagnoses:  Hepatic encephalopathy  Urinary tract infection without hematuria,  site unspecified  Confusion  Weakness   Plan admission  Rolland Porter, MD, FACEP   I personally performed the services described in this documentation, which was scribed in my presence. The recorded information has been  reviewed and considered.  Rolland Porter, MD, Abram Sander    Janice Norrie, MD 08/17/14 2134

## 2014-08-17 NOTE — H&P (Signed)
[facility] History and Physical  [name] MGQ:[id number] DOB: 06/06/1941 DOA: 08/17/2014  Referring physician: Dr. Tomi [name] PCP: [name] Kilts, MD  Specialists: none  Chief Complaint: confusion  Assessment/Plan Active Problems:   Hepatic encephalopathy UTI Cirrhosis - medication induced Protein calorie Malnurishment DM HTN GERD Depression Anxiety Neuropathy Cholelithiasis Thrombocytopenia  Encephalopathy: Likely multifactorial - hepatic (ammonia 140) and UTI. Pt no entirely compliant on home lactulose. No signs of hypoxemia, or seizure like activity. Unlikely medication or substance abuse related. No sign of SBP - Admit med surge - Increase Lactulose to 30gm TID - Start Rifaximin - UTI as below  Cirrhosis: medication induced while in pharmaceutical trial. MELD score 9. - continue home lasix, spironolactone  UTI: Dirty sample but + for nitrite adn leuk.  - Keflex - UCX  Deconditioning and protein calorie malnutrition: family expressing growing concerns over being able to manage pt at home. Reports falls and inability to help pt w/ ADLs. - PT/OT  - CSW - placement recommendations - nutrition consult  Thrombocytopenia: Plt 138 on admission. Well above typical baseline - CBC in am  Chronic pain and Neuropathy: - continue home Oxcycontin 10 BID - continue lyrica  DM: Last A1c 08/04/14 7.3 - 1/2 dose home levemire while inpt to 30 QHS - SSI  Cholelithiasis: pt is not a surgical candidate for cholecystectomy and apparently after lengthy discussions w/ GI pt was placed on Actigall a short time ago.  - continue Actigall  GERD: well controlled - continue PPI  HTN: at goal.  - continue home ACEi  DVT Prophylaxis: Hep [place] TID (Consider DC if plt drops)  Code Status: FULL Family Communication: [name] and [name] at bedside Disposition Plan: pending improvement  HPI: [name] [name] is a 73 y.o. female came to [name] ed 08/17/2014 with  Confusion.  Started about 7 days ago. Recurring issue for pt. Getting worse. Associated w/ general weakness. Family unable to care for pt at home. Denies frequency, dysuria, abd pain, SOB, CP. Not taking Lactulose TID  Review of Systems: Per HPI w/ all other systems negative.   Past Medical History  Diagnosis Date  . CAD in native artery   . Thrombocytopenia, unspecified   . Unspecified hereditary and idiopathic peripheral neuropathy   . Unspecified fall   . Other and unspecified hyperlipidemia   . Unspecified essential hypertension   . Obesity, unspecified   . Personal history of neurosis   . Intrinsic asthma, unspecified   . Allergic rhinitis, cause unspecified   . Cirrhosis of liver     NASH, afp on 06/17/12 =3.7, per pt she had hep B vaccines in 1996, hep A in process.   . Colon adenomas 03/08/10    tcs by Dr. Gala [name]  . Hyperplastic polyps of stomach 03/08/10    tcs by Dr. Dudley [name]  . Chronic gastritis 03/09/11    egd by Dr. Gala [name]  . History of hemorrhoids 03/08/10    tcs- internal and external  . Diverticula of colon 03/08/10    L side  . Esophagitis, erosive 03/08/10  . GERD (gastroesophageal reflux disease)   . Wears glasses   . Type II or unspecified type diabetes mellitus without mention of complication, not stated as uncontrolled   . Vertigo   . Pancreatitis   . Bilateral leg weakness   . Thrombocytopenia 04/12/2014  . Pancytopenia, acquired 04/12/2014  . Cirrhosis   . Abnormal EKG     hx of left anterior fasicular block on 04-09-13 ekg  epic   Past Surgical History  Procedure Laterality Date  . Hysterectomy and btl      s/p  . Tumor excision  2003    rt arm and left foot  . Colonoscopy  03/08/10    [name]-->ext/int hemorrhoids, anal paipilla, rectal polyps, desc polyps, cecal polyp, left-sided diverticula. suboptiomal prep. next TCS 02/2015. multiple adenomas  . Esophagogastroduodenoscopy  03/08/10    Dr. Chelsea [name] erosive RE, small hh, antral erosions, small 1cm are of mucosal  indentation along gastric body of doubtful significance, cystic nodularity of hypophyarynx, base of tongue, base of epiglottis. benign gastric biopsies  . Tubal ligation    . Esophagogastroduodenoscopy  10/08/2011    Dr. Alda [name] esophageal varices, antral erosion. next egd 09/2013  . Foot surgery  2003    lt foot  . Eye surgery      cataracts bilateral  . Orif humerus fracture Right 02/17/2013    Procedure: OPEN REDUCTION INTERNAL FIXATION (ORIF) RIGHT PROXIMAL HUMERUS FRACTURE;  Surgeon: [name] Box, MD;  Location: [name];  Service: Orthopedics;  Laterality: Right;  . Partial gastrectomy      family denies  . Esophagogastroduodenoscopy N/A 02/17/2014    Dr. Gala [name]: portal gastropathy, no varices. Due for surveillance 2017  . Breast surgery Right few yrs ago    benign area removed  . Abdominal hysterectomy    . Cardiac catheterization  02/25/2007    Dr. Rex [name]:  normal LV systolic function, mild irregularities of LAD   Social History:  History   Social History Narrative  . No narrative on file    Allergies  Allergen Reactions  . Morphine And Related Itching  . Prochlorperazine Edisylate Other (See Comments)    Causes anxiety/numbness, pt not sure of this allergy  . Compazine [Prochlorperazine Edisylate] Anxiety    Causes anxiety & nervousness    Family History  Problem Relation Age of Onset  . Coronary artery disease      FH  . Diabetes      FH  . Heart disease Mother   . Colon cancer Neg Hx      Prior to Admission medications   Medication Sig Start Date End Date Taking? Authorizing [name]  ALPRAZolam [name]) 0.5 MG tablet Take 0.5 mg by mouth 3 (three) times daily as needed for anxiety.   Yes [name] Provider, MD  aspirin EC 325 MG EC tablet Take 1 tablet (325 mg total) by mouth every morning. 07/14/14  Yes [name] Sells, MD  budesonide-formoterol (SYMBICORT) 160-4.5 MCG/ACT inhaler Inhale 2 puffs into the lungs at bedtime.    Yes [name] Provider, MD    ESTRACE VAGINAL 0.1 MG/GM vaginal cream Place 1 Applicatorful vaginally every other day. At  bedtime 08/11/13  Yes [name] Provider, MD  furosemide (LASIX) 20 MG tablet Take 20 mg by mouth every morning.   Yes [name] Provider, MD  insulin aspart (NOVOLOG FLEXPEN) 100 UNIT/ML FlexPen Inject 2-10 Units into the skin 3 (three) times daily with meals. Sliding Scale: 200-250= 2 units 251-300= 4 units 301-350= 6 units 351-400= 8 units If levels are over 400, GIVE 10 UNITS AND CALL MD   Yes [name] Provider, MD  insulin detemir (LEVEMIR) 100 UNIT/ML injection Inject 60 Units into the skin at bedtime.    Yes [name] Provider, MD  lactulose, encephalopathy, (GENERLAC) 10 GM/15ML SOLN Take 45 mLs (30 g total) by mouth 3 (three) times daily. 06/28/14  Yes Kathie Dike, MD  Multiple Vitamins-Minerals (CENTRUM SILVER PO) Take 1 tablet by  mouth daily.    Yes [name] Provider, MD  NEXIUM 40 MG capsule Take 40 mg by mouth every morning.  06/17/14  Yes [name] Provider, MD  OxyCODONE (OXYCONTIN) 10 mg T12A 12 hr tablet Take 1 tablet (10 mg total) by mouth 2 (two) times daily as needed (severe pain). 07/14/14  Yes [name] Sells, MD  pregabalin (LYRICA) 100 MG capsule Take 100 mg by mouth 2 (two) times daily.   Yes [name] Provider, MD  quinapril (ACCUPRIL) 5 MG tablet Take 5 mg by mouth at bedtime.   Yes [name] Provider, MD  rifaximin (XIFAXAN) 550 MG TABS tablet Take 1 tablet (550 mg total) by mouth 2 (two) times daily. 06/28/14  Yes Kathie Dike, MD  spironolactone (ALDACTONE) 50 MG tablet Take 50 mg by mouth every morning.   Yes [name] Provider, MD  triamcinolone cream (KENALOG) 0.1 % Apply 1 application topically daily as needed (for irritation).  06/26/13  Yes [name] Provider, MD  ursodiol (URSO FORTE) 500 MG tablet Take 500 mg by mouth 2 (two) times daily.   Yes [name] Provider, MD  meclizine (ANTIVERT) 25 MG tablet Take 25 mg by mouth 3 (three) times daily as needed  for dizziness or nausea.     [name] Provider, MD  ondansetron (ZOFRAN) 4 MG tablet Take 1 tablet (4 mg total) by mouth every 6 (six) hours as needed for nausea. 08/06/14   Radene Gunning, NP   Physical Exam: Filed Vitals:   08/17/14 1357 08/17/14 1401 08/17/14 1430 08/17/14 1600  BP: 138/57  125/63   Pulse: 67  64   Temp: 97.5 F (36.4 C)     TempSrc: Oral     Resp: 15   12  Height:  5\' 8"  (1.727 m)    Weight:  105.235 kg (232 lb)    SpO2: 99%        General:  NAD, tired appearing  Eyes: PERRL, EOMI   ENT: mmm,  Neck: FROM  Cardiovascular: RRR, no m/r/g  Respiratory: Nml WOB. No wheezes, ronchi. Good breath sounds throughout  Abdomen: undistended, NABS, nonttp  Skin: mild jaundice, well perfused, intact  Musculoskeletal: 2+ Bilat LE edema, moves all extremities spontaneously   Psychiatric: nml affect  Neurologic: CN2-12 Grossly intact, cerebellar fxn nml, AO to person and place only.   Labs on Admission:  Basic Metabolic Panel:  Recent Labs Lab 08/17/14 1500  NA 140  K 3.9  CL 102  CO2 25  GLUCOSE 200*  BUN 15  CREATININE 0.63  CALCIUM 8.8   Liver Function Tests:  Recent Labs Lab 08/17/14 1500  AST 26  ALT 14  ALKPHOS 112  BILITOT 1.6*  PROT 6.1  ALBUMIN 3.1*   No results found for this basename: LIPASE, AMYLASE,  in the last 168 hours  Recent Labs Lab 08/17/14 1501  AMMONIA 140*   CBC:  Recent Labs Lab 08/17/14 1500  WBC 4.4  NEUTROABS 3.1  HGB 12.3  HCT 38.1  MCV 78.1  PLT 138*   Cardiac Enzymes:  Recent Labs Lab 08/17/14 1502  TROPONINI <0.30    BNP (last 3 results) No results found for this basename: PROBNP,  in the last 8760 hours CBG: No results found for this basename: GLUCAP,  in the last 168 hours  Radiological Exams on Admission: Ct Head Wo Contrast  08/17/2014   CLINICAL DATA:  Increased confusion, weakness  EXAM: CT HEAD WITHOUT CONTRAST  TECHNIQUE: Contiguous axial images were obtained from the base of  the  skull through the vertex without intravenous contrast.  COMPARISON:  07/12/2014  FINDINGS: No skull fracture is noted. No intracranial hemorrhage, mass effect or midline shift. Paranasal sinuses and mastoid air cells are unremarkable. Stable cerebral atrophy. No acute cortical infarction. No mass lesion is noted on this unenhanced scan. Stable chronic mild white matter disease.  IMPRESSION: No acute intracranial abnormality.  Stable cerebral atrophy.   Electronically Signed   By: Lahoma Crocker M.D.   On: 08/17/2014 15:30       Time spent: >70  Min in direct pt care and coordination   Lazlo Tunney J, MD [facility] [email or web address] Password [email or web address] 08/17/2014, 5:48 PM

## 2014-08-17 NOTE — ED Notes (Signed)
Per husband, states pt is not able to get from the wheelchair to the bedside commode. States she has been going to Dr. Hilma Favors for the confusion. Pt denies any other symptoms

## 2014-08-17 NOTE — ED Notes (Signed)
Pt placed in bed with three people assist.

## 2014-08-18 DIAGNOSIS — K7290 Hepatic failure, unspecified without coma: Principal | ICD-10-CM

## 2014-08-18 DIAGNOSIS — R5381 Other malaise: Secondary | ICD-10-CM

## 2014-08-18 DIAGNOSIS — R262 Difficulty in walking, not elsewhere classified: Secondary | ICD-10-CM

## 2014-08-18 LAB — CBC
HEMATOCRIT: 33 % — AB (ref 36.0–46.0)
HEMOGLOBIN: 10.8 g/dL — AB (ref 12.0–15.0)
MCH: 25.5 pg — AB (ref 26.0–34.0)
MCHC: 32.7 g/dL (ref 30.0–36.0)
MCV: 77.8 fL — ABNORMAL LOW (ref 78.0–100.0)
Platelets: 148 10*3/uL — ABNORMAL LOW (ref 150–400)
RBC: 4.24 MIL/uL (ref 3.87–5.11)
RDW: 14.6 % (ref 11.5–15.5)
WBC: 3.3 10*3/uL — ABNORMAL LOW (ref 4.0–10.5)

## 2014-08-18 LAB — COMPREHENSIVE METABOLIC PANEL
ALT: 11 U/L (ref 0–35)
AST: 21 U/L (ref 0–37)
Albumin: 2.8 g/dL — ABNORMAL LOW (ref 3.5–5.2)
Alkaline Phosphatase: 97 U/L (ref 39–117)
Anion gap: 12 (ref 5–15)
BUN: 14 mg/dL (ref 6–23)
CO2: 26 mEq/L (ref 19–32)
Calcium: 8.5 mg/dL (ref 8.4–10.5)
Chloride: 104 mEq/L (ref 96–112)
Creatinine, Ser: 0.64 mg/dL (ref 0.50–1.10)
GFR calc Af Amer: >90 mL/min (ref >90)
GFR calc non Af Amer: 88 mL/min — ABNORMAL LOW (ref >90)
Glucose, Bld: 154 mg/dL — ABNORMAL HIGH (ref 70–99)
Potassium: 3.6 mEq/L — ABNORMAL LOW (ref 3.7–5.3)
Sodium: 142 mEq/L (ref 137–147)
Total Bilirubin: 1.4 mg/dL — ABNORMAL HIGH (ref 0.3–1.2)
Total Protein: 5.4 g/dL — ABNORMAL LOW (ref 6.0–8.3)

## 2014-08-18 LAB — GLUCOSE, CAPILLARY
Glucose-Capillary: 128 mg/dL — ABNORMAL HIGH (ref 70–99)
Glucose-Capillary: 152 mg/dL — ABNORMAL HIGH (ref 70–99)
Glucose-Capillary: 176 mg/dL — ABNORMAL HIGH (ref 70–99)
Glucose-Capillary: 200 mg/dL — ABNORMAL HIGH (ref 70–99)

## 2014-08-18 LAB — MAGNESIUM: MAGNESIUM: 1.9 mg/dL (ref 1.5–2.5)

## 2014-08-18 LAB — AMMONIA: Ammonia: 91 umol/L — ABNORMAL HIGH (ref 11–60)

## 2014-08-18 MED ORDER — POTASSIUM CHLORIDE CRYS ER 20 MEQ PO TBCR
40.0000 meq | EXTENDED_RELEASE_TABLET | Freq: Once | ORAL | Status: AC
  Administered 2014-08-18: 40 meq via ORAL
  Filled 2014-08-18: qty 2

## 2014-08-18 MED ORDER — PRO-STAT SUGAR FREE PO LIQD
30.0000 mL | Freq: Two times a day (BID) | ORAL | Status: DC
  Administered 2014-08-18 – 2014-08-19 (×2): 30 mL via ORAL
  Filled 2014-08-18 (×2): qty 30

## 2014-08-18 NOTE — Evaluation (Signed)
Physical Therapy Evaluation Patient Details Name: [name] MRN: [id number] DOB: [birth year] Today's Date: 08/18/2014   History of Present Illness  [name] is a 73 y.o. female came to [facility] ed 08/17/2014 with  Confusion. Started about 7 days ago. Recurring issue for pt. Getting worse. Associated w/ general weakness. Family unable to care for pt at home. Denies frequency, dysuria, abd pain, SOB, CP. Not taking Lactulose TID  Clinical Impression  Pt is a 73 year old female who presents to PT with dx of hepatic encephalopathy.  Pt has been evaluated by PT on multiple occassions, 06/28/14 with recommendation for HHPT, 07/13/14 with recommendation for SNF, 07/30/14 with recommendation for HHPT vs. SNF.  Pt reports increasing weakness recently, requiring occasional assist from husband for bed mobility and transfers.  Limited ambulation reported recently as pt reprots she spends most of her time in the bed or her W/C.  Pt does report she was able to self propel W/C with her feet.  During evaluation, pt required mod assist with bed mobility skills and mod/max assist for sit -> stand transfers with use of RW.  Noted decreased balance in sitting and standing, with poor balance secondary to Lt lateral lean.      Follow Up Recommendations SNF (SNF.  Pt may benefit from consult for ALF. )    Equipment Recommendations  None recommended by PT       Precautions / Restrictions Precautions Precautions: Fall Restrictions Weight Bearing Restrictions: No      Mobility  Bed Mobility Overal bed mobility: Needs Assistance Bed Mobility: Supine to Sit;Sit to Supine     Supine to sit: Mod assist Sit to supine: Supervision   General bed mobility comments: Pt required increased time and VC to complete task.  Noted increased lateral lean to the Lt, requiring continual VC for upright sitting.   Transfers Overall transfer level: Needs assistance Equipment used: Rolling walker (2 wheeled) Transfers: Sit  to/from Stand Sit to Stand: Mod assist;Max assist            Ambulation/Gait             General Gait Details: Attempted side stepping, though pt unable to clear Lt foot.       Balance Overall balance assessment: History of Falls;Needs assistance (Pt reports 3-4 falls in the past month.) Sitting-balance support: Bilateral upper extremity supported;Feet supported Sitting balance-Leahy Scale: Poor Sitting balance - Comments: Poor sitting balance, with lateral lean to the Lt.  Continual VC, and close SBA for upright sitting.   Standing balance support: Bilateral upper extremity supported;During functional activity Standing balance-Leahy Scale: Poor Standing balance comment: Mod-max assist to maintain upright standing.                              Pertinent Vitals/Pain Pain Assessment: No/denies pain    Home Living Family/patient expects to be discharged to:: Skilled nursing facility Living Arrangements: Spouse/significant other Available Help at Discharge: Family;Available 24 hours/day (Grandson lives next door) Type of Home: House Home Access: [place]: [place]: Environmental consultant - 2 wheels;Cane - single point;Bedside commode;Tub bench;Wheelchair - Biomedical engineer Comments: Tub shower    Prior Function Level of Independence: Needs assistance   Gait / Transfers Assistance Needed: Per pt her husband has been helping her with bed mobility and transfers.  Limited ambulation reported, with pt spending most of her time in the  bed or W/C.  Pt able to self propel W/C with her feet.            Hand Dominance   Dominant Hand: Right    Extremity/Trunk Assessment               Lower Extremity Assessment: Generalized weakness         Communication   Communication: No difficulties  Cognition Arousal/Alertness: Awake/alert Behavior During Therapy: WFL for tasks assessed/performed Overall Cognitive  Status: Within Functional Limits for tasks assessed                        Assessment/Plan    PT Assessment Patient needs continued PT services  PT Diagnosis Difficulty walking;Generalized weakness   PT Problem List Decreased strength;Decreased activity tolerance;Decreased balance;Decreased mobility;Obesity;Decreased safety awareness  PT Treatment Interventions Gait training;Functional mobility training;Therapeutic exercise;Balance training;Patient/family education   PT Goals (Current goals can be found in the Care Plan section) Acute Rehab PT Goals Patient Stated Goal: wants to go home PT Goal Formulation: With patient Time For Goal Achievement: 09/01/14 Potential to Achieve Goals: Fair    Frequency Min 3X/week   Barriers to discharge Decreased caregiver support         End of Session Equipment Utilized During Treatment: Gait belt Activity Tolerance: Patient limited by fatigue Patient left: in bed;with call bell/phone within reach;with bed alarm set           Time: 1004-1037 PT Time Calculation (min): 33 min   Charges:   PT Evaluation $Initial PT Evaluation Tier I: 1 Procedure     Louvenia Golomb 08/18/2014, 11:48 AM

## 2014-08-18 NOTE — Progress Notes (Signed)
INITIAL NUTRITION ASSESSMENT  DOCUMENTATION CODES Per approved criteria  -Obesity Unspecified   INTERVENTION: 30 ml Prostat BID  NUTRITION DIAGNOSIS: Inadequate protein intake related to increased protein needs due to liver disease as evidenced by hx of cirrhosis, estimated nutritional needs.   Goal: Pt will meet >90% of estimated nutritional needs  Monitor:  PO intake, labs, weight changes, I/O's  Reason for Assessment: Consult to assess needs  73 y.o. female  Admitting Dx: <principal problem not specified>  [name] is a 73 y.o. female came to [facility] ed 08/17/2014 with Confusion. Started about 7 days ago. Recurring issue for pt. Getting worse. Associated w/ general weakness. Family unable to care for pt at home. Denies frequency, dysuria, abd pain, SOB, CP. Not taking Lactulose  ASSESSMENT: Pt admitted with hepatic encephalopathy. Per H&P, pt family unable to care for pt at home- PT/OT and SW consults pending.  Pt confirms UBW of 235#. She reports she has lost approximately 5# (2.1%) in the past 5 weeks. Wt hx shows minimal wt loss over the past 6 months, however, likely due to use of diuretics (pt on spironolactone, lactulose, and furosemide as an outpatient).  Pt reports her appetite is generally good. Noted 50-75% of breakfast tray completed- pt ate all but grits and eggs. Diet recall reveals- Breakfast: bacon, toast, coffee; Lunch: ham sandwich with cheese and lettuce, small salad, Cola; Dinner: meat, starch, and vegetable. Pt reports her husband does all of her meal preparation. She denies any trouble chewing or swallowing and does not drink nutritional supplements at home.  Reviewed with pt importance of good PO intake to support healing process. She denies any nutritional concerns at this time.  Labs reviewed. K: 3.6. Glucose: 154. CBGs: 128-220.   Nutrition Focused Physical Exam:  Subcutaneous Fat:  Orbital Region: mild depletion Upper Arm Region: mild  depeltion Thoracic and Lumbar Region: WDL  Muscle:  Temple Region: WDL Clavicle Bone Region: WDL Clavicle and Acromion Bone Region: WDL Scapular Bone Region: WDL Dorsal Hand: WDL Patellar Region: WDL Anterior Thigh Region: WDL Posterior Calf Region: WDL  Edema: none present   Pt does not meet criteria for malnutrition at this time. When a patient presents with low albumin, it is likely skewed due to the acute inflammatory response.  Unless it is suspected that patient had poor PO intake or malnutrition prior to admission, then RD should not be consulted solely for low albumin. Note that low albumin is no longer used to diagnose malnutrition; [facility] uses the new malnutrition guidelines published by the American Society for Parenteral and Enteral Nutrition (A.S.P.E.N.) and the Academy of Nutrition and Dietetics (AND).    Albumin has a half-life of 21 days and is strongly affected by stress response and inflammatory process, therefore, do not expect to see an improvement in this lab value during acute hospitalization.  Height: Ht Readings from Last 1 Encounters:  08/17/14 5\' 8"  (1.727 m)    Weight: Wt Readings from Last 1 Encounters:  08/17/14 230 lb 2.6 oz (104.4 kg)    Ideal Body Weight: 140#  % Ideal Body Weight: 164%  Wt Readings from Last 10 Encounters:  08/17/14 230 lb 2.6 oz (104.4 kg)  08/03/14 228 lb (103.42 kg)  07/29/14 230 lb 2.6 oz (104.4 kg)  07/12/14 230 lb 2.6 oz (104.4 kg)  06/27/14 230 lb 6.4 oz (104.51 kg)  04/08/14 234 lb (106.142 kg)  02/17/14 242 lb (109.77 kg)  02/17/14 242 lb (109.77 kg)  01/27/14 242 lb  12.8 oz (110.133 kg)  10/02/13 236 lb (107.049 kg)    Usual Body Weight: 235#  % Usual Body Weight: 98%  BMI:  Body mass index is 35 kg/(m^2). Obesity, class II  Estimated Nutritional Needs: Kcal: 1900-2100 Protein: 115-125 grams Fluid: 1.9-2.1 L  Skin: Intact  Diet Order: Carb Control  EDUCATION NEEDS: -Education needs  addressed   Intake/Output Summary (Last 24 hours) at 08/18/14 0857 Last data filed at 08/18/14 0605  Gross per 24 hour  Intake 433.33 ml  Output      0 ml  Net 433.33 ml    Last BM: PTA  Labs:   Recent Labs Lab 08/17/14 1500 08/18/14 0441  NA 140 142  K 3.9 3.6*  CL 102 104  CO2 25 26  BUN 15 14  CREATININE 0.63 0.64  CALCIUM 8.8 8.5  GLUCOSE 200* 154*    CBG (last 3)   Recent Labs  08/17/14 2134 08/18/14 0734  GLUCAP 220* 128*    Scheduled Meds: . aspirin EC  325 mg Oral q morning - 10a  . budesonide-formoterol  2 puff Inhalation QHS  . cephALEXin  500 mg Oral 3 times per day  . furosemide  20 mg Oral q morning - 10a  . heparin  5,000 Units Subcutaneous 3 times per day  . insulin aspart  0-5 Units Subcutaneous QHS  . insulin aspart  0-9 Units Subcutaneous TID WC  . insulin detemir  30 Units Subcutaneous QHS  . lactulose  30 g Oral TID  . lisinopril  5 mg Oral Daily  . pantoprazole  80 mg Oral Q1200  . pregabalin  100 mg Oral BID  . rifaximin  550 mg Oral BID  . spironolactone  50 mg Oral q morning - 10a  . ursodiol  300 mg Oral BID    Continuous Infusions: . sodium chloride 50 mL/hr at 08/17/14 2125  . sodium chloride 100 mL/hr at 08/17/14 1458    Past Medical History  Diagnosis Date  . CAD in native artery   . Thrombocytopenia, unspecified   . Unspecified hereditary and idiopathic peripheral neuropathy   . Unspecified fall   . Other and unspecified hyperlipidemia   . Unspecified essential hypertension   . Obesity, unspecified   . Personal history of neurosis   . Intrinsic asthma, unspecified   . Allergic rhinitis, cause unspecified   . Cirrhosis of liver     NASH, afp on 06/17/12 =3.7, per pt she had hep B vaccines in 1996, hep A in process.   . Colon adenomas 03/08/10    tcs by Dr. Gala Romney  . Hyperplastic polyps of stomach 03/08/10    tcs by Dr. Dudley Major  . Chronic gastritis 03/09/11    egd by Dr. Gala Romney  . History of hemorrhoids 03/08/10     tcs- internal and external  . Diverticula of colon 03/08/10    L side  . Esophagitis, erosive 03/08/10  . GERD (gastroesophageal reflux disease)   . Wears glasses   . Type II or unspecified type diabetes mellitus without mention of complication, not stated as uncontrolled   . Vertigo   . Pancreatitis   . Bilateral leg weakness   . Thrombocytopenia 04/12/2014  . Pancytopenia, acquired 04/12/2014  . Cirrhosis   . Abnormal EKG     hx of left anterior fasicular block on 04-09-13 ekg epic    Past Surgical History  Procedure Laterality Date  . Hysterectomy and btl      s/p  .  Tumor excision  2003    rt arm and left foot  . Colonoscopy  03/08/10    Dr. [name]-->ext/int hemorrhoids, anal paipilla, rectal polyps, desc polyps, cecal polyp, left-sided diverticula. suboptiomal prep. next TCS 02/2015. multiple adenomas  . Esophagogastroduodenoscopy  03/08/10    Dr. [name] erosive RE, small hh, antral erosions, small 1cm are of mucosal indentation along gastric body of doubtful significance, cystic nodularity of hypophyarynx, base of tongue, base of epiglottis. benign gastric biopsies  . Tubal ligation    . Esophagogastroduodenoscopy  10/08/2011    Dr. [name] esophageal varices, antral erosion. next egd 09/2013  . Foot surgery  2003    lt foot  . Eye surgery      cataracts bilateral  . Orif humerus fracture Right 02/17/2013    Procedure: OPEN REDUCTION INTERNAL FIXATION (ORIF) RIGHT PROXIMAL HUMERUS FRACTURE;  Surgeon: [name], MD;  Location: [facility];  Service: Orthopedics;  Laterality: Right;  . Partial gastrectomy      family denies  . Esophagogastroduodenoscopy N/A 02/17/2014    Dr. Gala Romney: portal gastropathy, no varices. Due for surveillance 2017  . Breast surgery Right few yrs ago    benign area removed  . Abdominal hysterectomy    . Cardiac catheterization  02/25/2007    Dr. [name]:  normal LV systolic function, mild irregularities of LAD    Chardonay [place] A. Jimmye Norman, RD, LDN Pager:  [phone]

## 2014-08-18 NOTE — Progress Notes (Signed)
[facility] PROGRESS NOTE  [name] [name] GYI:[id number] DOB: May 09, 1941 DOA: 08/17/2014 PCP: Purvis Kilts, MD  Assessment/Plan: Acute Metabolic Encephalopathy -Multifactorial: hepatic and UTI. -Much more alert today per husband. -See below for details.  Hepatic Encephalopathy -Not fully complaint with lactulose regimen (per her own admission). -Stopped rifaximin because of financial issues. -Have restarted lactulose and rifaximin (will ask CM to look into financial issues). -Follow ammonia levels.  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 -See above for details.  UTI -Continue keflex pending cx data.  Hypokalemia -Replete PO. -Check Mg levels.  Thrombocytopenia -Mild. -2/2 cirrhosis.  Deconditioning -Acute component related to above, but patient has also been FTT at home. Husband has concerns with her falls and his ability to care for her at home. -PT recs SNF. Will alert SW.  Cholelithiasis -Not active at present, but patient has been deemed not to be a surgical candidate and is on actigall.  DM -Fair control. -Continue current regimen.  GERD -No complaints. -Continue PPI.  HTN -Well controlled. -Continue current regimen.  Code Status: Full COde Family Communication: Husband [name] at bedside updated on plan of care.  Disposition Plan: To be determined, but likely SNF.   Consultants:  None   Antibiotics:  Keflex    Subjective: More alert today, no complaints.  Objective: Filed Vitals:   08/17/14 1826 08/17/14 2128 08/17/14 2209 08/18/14 0700  BP: 121/57 113/50  118/57  Pulse: 60 64  59  Temp:  98.2 F (36.8 C)  99.1 F (37.3 C)  TempSrc:  Oral  Oral  Resp: 20 20  20   Height:  5\' 8"  (1.727 m)    Weight:  104.4 kg (230 lb 2.6 oz)    SpO2: 96% 95% 94% 97%    Intake/Output Summary (Last 24 hours) at 08/18/14 1229 Last data filed at 08/18/14 1100  Gross per 24 hour  Intake 673.33 ml  Output   1000 ml  Net -326.67 ml    Filed Weights   08/17/14 1401 08/17/14 2128  Weight: 105.235 kg (232 lb) 104.4 kg (230 lb 2.6 oz)    Exam:   General:  AA ox3, slow  Cardiovascular: RRR, no M/R/G  Respiratory: CTA B  Abdomen: obese,/S/NT/ND/+BS  Extremities: 1-2+ pitting edema bilaterally   Neurologic:  Non-focal  Data Reviewed: Basic Metabolic Panel:  Recent Labs Lab 08/17/14 1500 08/18/14 0441  NA 140 142  K 3.9 3.6*  CL 102 104  CO2 25 26  GLUCOSE 200* 154*  BUN 15 14  CREATININE 0.63 0.64  CALCIUM 8.8 8.5   Liver Function Tests:  Recent Labs Lab 08/17/14 1500 08/18/14 0441  AST 26 21  ALT 14 11  ALKPHOS 112 97  BILITOT 1.6* 1.4*  PROT 6.1 5.4*  ALBUMIN 3.1* 2.8*   No results found for this basename: LIPASE, AMYLASE,  in the last 168 hours  Recent Labs Lab 08/17/14 1501  AMMONIA 140*   CBC:  Recent Labs Lab 08/17/14 1500 08/18/14 0441  WBC 4.4 3.3*  NEUTROABS 3.1  --   HGB 12.3 10.8*  HCT 38.1 33.0*  MCV 78.1 77.8*  PLT 138* 148*   Cardiac Enzymes:  Recent Labs Lab 08/17/14 1502  TROPONINI <0.30   BNP (last 3 results) No results found for this basename: PROBNP,  in the last 8760 hours CBG:  Recent Labs Lab 08/17/14 2134 08/18/14 0734 08/18/14 1141  GLUCAP 220* 128* 152*    No results found for this or any previous visit (from  the past 240 hour(s)).   Studies: Ct Head Wo Contrast  08/17/2014   CLINICAL DATA:  Increased confusion, weakness  EXAM: CT HEAD WITHOUT CONTRAST  TECHNIQUE: Contiguous axial images were obtained from the base of the skull through the vertex without intravenous contrast.  COMPARISON:  07/12/2014  FINDINGS: No skull fracture is noted. No intracranial hemorrhage, mass effect or midline shift. Paranasal sinuses and mastoid air cells are unremarkable. Stable cerebral atrophy. No acute cortical infarction. No mass lesion is noted on this unenhanced scan. Stable chronic mild white matter disease.  IMPRESSION: No acute intracranial  abnormality.  Stable cerebral atrophy.   Electronically Signed   By: Lahoma Crocker M.D.   On: 08/17/2014 15:30    Scheduled Meds: . aspirin EC  325 mg Oral q morning - 10a  . budesonide-formoterol  2 puff Inhalation QHS  . cephALEXin  500 mg Oral 3 times per day  . feeding supplement (PRO-STAT SUGAR FREE 64)  30 mL Oral BID WC  . furosemide  20 mg Oral q morning - 10a  . heparin  5,000 Units Subcutaneous 3 times per day  . insulin aspart  0-5 Units Subcutaneous QHS  . insulin aspart  0-9 Units Subcutaneous TID WC  . insulin detemir  30 Units Subcutaneous QHS  . lactulose  30 g Oral TID  . lisinopril  5 mg Oral Daily  . pantoprazole  80 mg Oral Q1200  . pregabalin  100 mg Oral BID  . rifaximin  550 mg Oral BID  . spironolactone  50 mg Oral q morning - 10a  . ursodiol  300 mg Oral BID   Continuous Infusions: . sodium chloride 50 mL/hr at 08/17/14 2125  . sodium chloride 100 mL/hr at 08/17/14 1458    Principal Problem:   Hepatic encephalopathy Active Problems:   UTI (urinary tract infection)   Physical deconditioning   Morbid obesity    Time spent: 40 minutes.  Greater than 50% of this time was spent in direct contact with the patient coordinating care.    Lelon Frohlich  [facility] Pager [phone]  If 7PM-7AM, please contact night-coverage at [email or web address], password [facility] 08/18/2014, 12:29 PM  LOS: 1 day

## 2014-08-18 NOTE — Clinical Social Work Psychosocial (Signed)
Clinical Social Work Department BRIEF PSYCHOSOCIAL ASSESSMENT 08/18/2014  Patient:  [name] [name], [name] [name]     Account Number:  [id number]     Admit date:  08/17/2014  Clinical Social Worker:  Wyatt Haste  Date/Time:  08/18/2014 01:29 PM  Referred by:  Physician  Date Referred:  08/18/2014 Referred for  SNF Placement   Other Referral:   Interview type:  Patient Other interview type:   spouse- [name]    PSYCHOSOCIAL DATA Living Status:  HUSBAND Admitted from facility:   Level of care:   Primary support name:  [name] Primary support relationship to patient:  SPOUSE Degree of support available:   supportive    CURRENT CONCERNS Current Concerns  Post-Acute Placement   Other Concerns:    SOCIAL WORK ASSESSMENT / PLAN CSW met with pt and pt's husband at bedside. Pt alert and oriented and known to CSW from previous admissions. She recently was at [facility] and d/c home after returning to hospital. Pt lives with her husband and has very involved family. She states she has been "going downhill" since returning home. Pt's husband feels that home health is no longer helping and she has deconditioned. He is worried about being able to continue to manage pt at home. PT evaluated pt and recommendation is for SNF. CSW discussed placement process again and they are aware that pt will not have a reset of SNF days as she just left SNF. Pt states it is most important to stay in [place]. She is worried that for some reason she won't be able to attend her grandson's wedding at the end of the month. CSW discussed that pt could talk to SNF about this and family could possibly take her for the day. SNF list provided.   Assessment/plan status:  Psychosocial Support/Ongoing Assessment of Needs Other assessment/ plan:   Information/referral to community resources:   SNF list    PATIENT'S/FAMILY'S RESPONSE TO PLAN OF CARE: Pt states she knows she needs to go to SNF again and requests CSW to initiate bed  search in [place]. CSW will follow up.       [name], [place]

## 2014-08-18 NOTE — Progress Notes (Signed)
UR chart review completed.  

## 2014-08-18 NOTE — Clinical Social Work Placement (Signed)
Clinical Social Work Department CLINICAL SOCIAL WORK PLACEMENT NOTE 08/18/2014  Patient:  CLAUDINE, STALLINGS  Account Number:  [id number] Admit date:  08/17/2014  Clinical Social Worker:  [name], LCSW  Date/time:  08/18/2014 01:27 PM  Clinical Social Work is seeking post-discharge placement for this patient at the following level of care:   [place]   (*CSW will update this form in Epic as items are completed)   08/18/2014  Patient/family provided with [place] Department of Clinical Social Work's list of facilities offering this level of care within the geographic area requested by the patient (or if unable, by the patient's family).  08/18/2014  Patient/family informed of their freedom to choose among providers that offer the needed level of care, that participate in Medicare, Medicaid or managed care program needed by the patient, have an available bed and are willing to accept the patient.  08/18/2014  Patient/family informed of [facility]' ownership interest in [facility], as well as of the fact that they are under no obligation to receive care at this facility.  PASARR submitted to EDS on  PASARR number received on   FL2 transmitted to all facilities in geographic area requested by pt/family on  08/18/2014 FL2 transmitted to all facilities within larger geographic area on   Patient informed that his/her managed care company has contracts with or will negotiate with  certain facilities, including the following:     Patient/family informed of bed offers received:   Patient chooses bed at  Physician recommends and patient chooses bed at    Patient to be transferred to  on   Patient to be transferred to facility by  Patient and family notified of transfer on  Name of family member notified:    The following physician request were entered in Epic:   Additional Comments: Pt has existing pasarr.  [name], [place]

## 2014-08-19 LAB — GLUCOSE, CAPILLARY
GLUCOSE-CAPILLARY: 160 mg/dL — AB (ref 70–99)
GLUCOSE-CAPILLARY: 195 mg/dL — AB (ref 70–99)

## 2014-08-19 LAB — COMPREHENSIVE METABOLIC PANEL
ALK PHOS: 102 U/L (ref 39–117)
ALT: 11 U/L (ref 0–35)
AST: 18 U/L (ref 0–37)
Albumin: 2.8 g/dL — ABNORMAL LOW (ref 3.5–5.2)
Anion gap: 10 (ref 5–15)
BILIRUBIN TOTAL: 1.5 mg/dL — AB (ref 0.3–1.2)
BUN: 13 mg/dL (ref 6–23)
CO2: 26 mEq/L (ref 19–32)
Calcium: 8.7 mg/dL (ref 8.4–10.5)
Chloride: 104 mEq/L (ref 96–112)
Creatinine, Ser: 0.63 mg/dL (ref 0.50–1.10)
GFR calc Af Amer: >90 mL/min (ref >90)
GFR calc non Af Amer: 88 mL/min — ABNORMAL LOW (ref >90)
Glucose, Bld: 181 mg/dL — ABNORMAL HIGH (ref 70–99)
POTASSIUM: 4 meq/L (ref 3.7–5.3)
SODIUM: 140 meq/L (ref 137–147)
Total Protein: 5.5 g/dL — ABNORMAL LOW (ref 6.0–8.3)

## 2014-08-19 LAB — CBC
HCT: 34.8 % — ABNORMAL LOW (ref 36.0–46.0)
Hemoglobin: 11.3 g/dL — ABNORMAL LOW (ref 12.0–15.0)
MCH: 25.4 pg — ABNORMAL LOW (ref 26.0–34.0)
MCHC: 32.5 g/dL (ref 30.0–36.0)
MCV: 78.2 fL (ref 78.0–100.0)
Platelets: 133 10*3/uL — ABNORMAL LOW (ref 150–400)
RBC: 4.45 MIL/uL (ref 3.87–5.11)
RDW: 14.7 % (ref 11.5–15.5)
WBC: 3.1 10*3/uL — ABNORMAL LOW (ref 4.0–10.5)

## 2014-08-19 LAB — AMMONIA: AMMONIA: 91 umol/L — AB (ref 11–60)

## 2014-08-19 MED ORDER — CIPROFLOXACIN HCL 500 MG PO TABS
500.0000 mg | ORAL_TABLET | Freq: Two times a day (BID) | ORAL | Status: DC
Start: 2014-08-19 — End: 2014-12-16

## 2014-08-19 MED ORDER — CIPROFLOXACIN HCL 250 MG PO TABS
500.0000 mg | ORAL_TABLET | Freq: Two times a day (BID) | ORAL | Status: DC
  Administered 2014-08-19: 500 mg via ORAL
  Filled 2014-08-19: qty 2

## 2014-08-19 MED ORDER — ALPRAZOLAM 0.5 MG PO TABS: 0.5000 mg | ORAL_TABLET | Freq: Three times a day (TID) | ORAL | Status: DC | PRN

## 2014-08-19 MED ORDER — OXYCODONE HCL ER 10 MG PO T12A: 10.0000 mg | EXTENDED_RELEASE_TABLET | Freq: Two times a day (BID) | ORAL | Status: DC | PRN

## 2014-08-19 NOTE — Progress Notes (Signed)
Oletha Blend discharged [facility] per MD order.  Discharge instructions reviewed and discussed with the patient and husband, all questions and concerns answered. Copy of instructions and scripts given to patient to be given to RN at [facility] upon arival. Called report to Jarrett Soho, Therapist, sports at [employer].    Medication List         ALPRAZolam 0.5 MG tablet  Commonly known as:  XANAX  Take 1 tablet (0.5 mg total) by mouth 3 (three) times daily as needed for anxiety.     aspirin 325 MG EC tablet  Take 1 tablet (325 mg total) by mouth every morning.     budesonide-formoterol 160-4.5 MCG/ACT inhaler  Commonly known as:  SYMBICORT  Inhale 2 puffs into the lungs at bedtime.     CENTRUM SILVER PO  Take 1 tablet by mouth daily.     ciprofloxacin 500 MG tablet  Commonly known as:  CIPRO  Take 1 tablet (500 mg total) by mouth 2 (two) times daily.     ESTRACE VAGINAL 0.1 MG/GM vaginal cream  Generic drug:  estradiol  Place 1 Applicatorful vaginally every other day. At  bedtime     furosemide 20 MG tablet  Commonly known as:  LASIX  Take 20 mg by mouth every morning.     insulin detemir 100 UNIT/ML injection  Commonly known as:  LEVEMIR  Inject 60 Units into the skin at bedtime.     lactulose (encephalopathy) 10 GM/15ML Soln  Commonly known as:  GENERLAC  Take 45 mLs (30 g total) by mouth 3 (three) times daily.     meclizine 25 MG tablet  Commonly known as:  ANTIVERT  Take 25 mg by mouth 3 (three) times daily as needed for dizziness or nausea.     NEXIUM 40 MG capsule  Generic drug:  esomeprazole  Take 40 mg by mouth every morning.     NOVOLOG FLEXPEN 100 UNIT/ML FlexPen  Generic drug:  insulin aspart  - Inject 2-10 Units into the skin 3 (three) times daily with meals. Sliding Scale:  - 200-250= 2 units  - 251-300= 4 units  - 301-350= 6 units  - 351-400= 8 units  - If levels are over 400, GIVE 10 UNITS AND CALL MD     ondansetron 4 MG tablet  Commonly known as:  ZOFRAN  Take 1  tablet (4 mg total) by mouth every 6 (six) hours as needed for nausea.     OxyCODONE 10 mg T12a 12 hr tablet  Commonly known as:  OXYCONTIN  Take 1 tablet (10 mg total) by mouth 2 (two) times daily as needed (severe pain).     pregabalin 100 MG capsule  Commonly known as:  LYRICA  Take 100 mg by mouth 2 (two) times daily.     quinapril 5 MG tablet  Commonly known as:  ACCUPRIL  Take 5 mg by mouth at bedtime.     rifaximin 550 MG Tabs tablet  Commonly known as:  XIFAXAN  Take 1 tablet (550 mg total) by mouth 2 (two) times daily.     spironolactone 50 MG tablet  Commonly known as:  ALDACTONE  Take 50 mg by mouth every morning.     triamcinolone cream 0.1 %  Commonly known as:  KENALOG  Apply 1 application topically daily as needed (for irritation).     URSO FORTE 500 MG tablet  Generic drug:  ursodiol  Take 500 mg by mouth 2 (two) times daily.  Patients skin is clean, dry and intact, no evidence of skin break down. IV site discontinued and catheter remains intact. Site without signs and symptoms of complications. Dressing and pressure applied.  Patient transported to [facility] via [employer] transportation.  Regino Bellow 08/19/2014 2:41 PM

## 2014-08-19 NOTE — Clinical Social Work Placement (Signed)
Clinical Social Work Department CLINICAL SOCIAL WORK PLACEMENT NOTE 08/19/2014  Patient:  [name] [name], [name] [name]  Account Number:  [id number] Admit date:  08/17/2014  Clinical Social Worker:  [name], LCSW  Date/time:  08/18/2014 01:27 PM  Clinical Social Work is seeking post-discharge placement for this patient at the following level of care:   [place]   (*CSW will update this form in Epic as items are completed)   08/18/2014  Patient/family provided with [place] Department of Clinical Social Work's list of facilities offering this level of care within the geographic area requested by the patient (or if unable, by the patient's family).  08/18/2014  Patient/family informed of their freedom to choose among providers that offer the needed level of care, that participate in Medicare, Medicaid or managed care program needed by the patient, have an available bed and are willing to accept the patient.  08/18/2014  Patient/family informed of [facility]' ownership interest in [facility], as well as of the fact that they are under no obligation to receive care at this facility.  PASARR submitted to EDS on  PASARR number received on   FL2 transmitted to all facilities in geographic area requested by pt/family on  08/18/2014 FL2 transmitted to all facilities within larger geographic area on   Patient informed that his/her managed care company has contracts with or will negotiate with  certain facilities, including the following:     Patient/family informed of bed offers received:  08/19/2014 Patient chooses bed at [place] Physician recommends and patient chooses bed at  [place]  Patient to be transferred to [facility] on  08/19/2014 Patient to be transferred to facility by [facility] Patient and family notified of transfer on 08/19/2014 Name of family member notified:  [name]- husband  The following physician request were  entered in Epic:   Additional Comments: Pt has existing pasarr.  [name], [place]

## 2014-08-19 NOTE — Discharge Summary (Signed)
Physician Discharge Summary  [name] [name] WUJ:[id number] DOB: December 17, 1940 DOA: 08/17/2014  PCP: Purvis Kilts, MD  Admit date: 08/17/2014 Discharge date: 08/19/2014  Time spent: 45 minutes  Recommendations for Outpatient Follow-up:  -Will be discharged to SNF today. -Please follow final urine cx data to determine if cipro is an adequate antibiotic choice.   Discharge Diagnoses:  Principal Problem:   Hepatic encephalopathy Active Problems:   UTI (urinary tract infection)   Physical deconditioning   Morbid obesity   Discharge Condition: Stable and improved  Filed Weights   08/17/14 1401 08/17/14 2128 08/19/14 0458  Weight: 105.235 kg (232 lb) 104.4 kg (230 lb 2.6 oz) 102.9 kg (226 lb 13.7 oz)    History of present illness:  [name] [name] is a 73 y.o. female came to  ed 08/17/2014 with  Confusion. Started about 7 days ago. Recurring issue for pt. Getting worse. Associated w/ general weakness. Family unable to care for pt at home. Denies frequency, dysuria, abd pain, SOB, CP. Not taking Lactulose TID.   Hospital Course:   Acute Metabolic Encephalopathy  -Multifactorial: hepatic and UTI.  -Resolved. -See below for details.   Hepatic Encephalopathy  -Has resolved. -Not fully complaint with lactulose regimen (per her own admission).  -Stopped rifaximin because of financial issues.  -Have restarted lactulose and rifaximin.   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   UTI  -Continue cipro for 7 days, -Follow final results of urine culture.  Hypokalemia  -Repleted.   Thrombocytopenia  -Mild.  -2/2 cirrhosis.   Deconditioning  -Acute component related to above, but patient has also been FTT at home. Husband has concerns with her falls and his ability to care for her at home.  -PT recs SNF.  Cholelithiasis  -Not active at present, but patient has been deemed not to be a surgical candidate and is on actigall.   DM  -Fair control.  -Continue current regimen.    GERD  -No complaints.  -Continue PPI.   HTN  -Well controlled.  -Continue current regimen.   Procedures:  None   Consultations:  None  Discharge Instructions  Discharge Instructions   Diet - low sodium heart healthy    Complete by:  As directed      Discontinue IV    Complete by:  As directed      Increase activity slowly    Complete by:  As directed             Medication List         ALPRAZolam 0.5 MG tablet  Commonly known as:  XANAX  Take 1 tablet (0.5 mg total) by mouth 3 (three) times daily as needed for anxiety.     aspirin 325 MG EC tablet  Take 1 tablet (325 mg total) by mouth every morning.     budesonide-formoterol 160-4.5 MCG/ACT inhaler  Commonly known as:  SYMBICORT  Inhale 2 puffs into the lungs at bedtime.     CENTRUM SILVER PO  Take 1 tablet by mouth daily.     ciprofloxacin 500 MG tablet  Commonly known as:  CIPRO  Take 1 tablet (500 mg total) by mouth 2 (two) times daily.     ESTRACE VAGINAL 0.1 MG/GM vaginal cream  Generic drug:  estradiol  Place 1 Applicatorful vaginally every other day. At  bedtime     furosemide 20 MG tablet  Commonly known as:  LASIX  Take 20 mg by mouth every morning.  insulin detemir 100 UNIT/ML injection  Commonly known as:  LEVEMIR  Inject 60 Units into the skin at bedtime.     lactulose (encephalopathy) 10 GM/15ML Soln  Commonly known as:  GENERLAC  Take 45 mLs (30 g total) by mouth 3 (three) times daily.     meclizine 25 MG tablet  Commonly known as:  ANTIVERT  Take 25 mg by mouth 3 (three) times daily as needed for dizziness or nausea.     NEXIUM 40 MG capsule  Generic drug:  esomeprazole  Take 40 mg by mouth every morning.     NOVOLOG FLEXPEN 100 UNIT/ML FlexPen  Generic drug:  insulin aspart  - Inject 2-10 Units into the skin 3 (three) times daily with meals. Sliding Scale:  - 200-250= 2 units  - 251-300= 4 units  - 301-350= 6 units  - 351-400= 8 units  - If levels are over  400, GIVE 10 UNITS AND CALL MD     ondansetron 4 MG tablet  Commonly known as:  ZOFRAN  Take 1 tablet (4 mg total) by mouth every 6 (six) hours as needed for nausea.     OxyCODONE 10 mg T12a 12 hr tablet  Commonly known as:  OXYCONTIN  Take 1 tablet (10 mg total) by mouth 2 (two) times daily as needed (severe pain).     pregabalin 100 MG capsule  Commonly known as:  LYRICA  Take 100 mg by mouth 2 (two) times daily.     quinapril 5 MG tablet  Commonly known as:  ACCUPRIL  Take 5 mg by mouth at bedtime.     rifaximin 550 MG Tabs tablet  Commonly known as:  XIFAXAN  Take 1 tablet (550 mg total) by mouth 2 (two) times daily.     spironolactone 50 MG tablet  Commonly known as:  ALDACTONE  Take 50 mg by mouth every morning.     triamcinolone cream 0.1 %  Commonly known as:  KENALOG  Apply 1 application topically daily as needed (for irritation).     URSO FORTE 500 MG tablet  Generic drug:  ursodiol  Take 500 mg by mouth 2 (two) times daily.       Allergies  Allergen Reactions  . Morphine And Related Itching  . Prochlorperazine Edisylate Other (See Comments)    Causes anxiety/numbness, pt not sure of this allergy  . Compazine [Prochlorperazine Edisylate] Anxiety    Causes anxiety & nervousness      The results of significant diagnostics from this hospitalization (including imaging, microbiology, ancillary and laboratory) are listed below for reference.    Significant Diagnostic Studies: Ct Head Wo Contrast  08/17/2014   CLINICAL DATA:  Increased confusion, weakness  EXAM: CT HEAD WITHOUT CONTRAST  TECHNIQUE: Contiguous axial images were obtained from the base of the skull through the vertex without intravenous contrast.  COMPARISON:  07/12/2014  FINDINGS: No skull fracture is noted. No intracranial hemorrhage, mass effect or midline shift. Paranasal sinuses and mastoid air cells are unremarkable. Stable cerebral atrophy. No acute cortical infarction. No mass lesion is  noted on this unenhanced scan. Stable chronic mild white matter disease.  IMPRESSION: No acute intracranial abnormality.  Stable cerebral atrophy.   Electronically Signed   By: Lahoma Crocker M.D.   On: 08/17/2014 15:30   Mr 3d Recon At Scanner  07/29/2014   CLINICAL DATA:  Cirrhosis and portal hypertension. Reported diagnosis of pancreatitis  EXAM: MRI ABDOMEN WITHOUT AND WITH CONTRAST (INCLUDING MRCP)  TECHNIQUE: Multiplanar  multisequence MR imaging of the abdomen was performed both before and after the administration of intravenous contrast. Heavily T2-weighted images of the biliary and pancreatic ducts were obtained, and three-dimensional MRCP images were rendered by post processing.  CONTRAST:  75mL MULTIHANCE GADOBENATE DIMEGLUMINE 529 MG/ML IV SOLN  COMPARISON:  The most recent similar comparison exam is abdominal ultrasound 11/19/2013 and CT abdomen/ pelvis 04/26/2013. MRI abdomen 06/29/2011.  FINDINGS: Multiple series are degraded by patient motion. There is mild to moderate diffuse pancreatic edema and trace peripancreatic fluid. Nodular hepatic contour and splenomegaly reidentified compatible with previously diagnosed cirrhosis and evidence of portal hypertension. Trace pericholecystic fluid and or gallbladder wall thickening reidentified with dependent tiny stones noted. 1.3 cm cyst at the dome of the right hepatic lobe reidentified as well as T2 hyperintense hepatic lesions measuring less than 5 mm statistically most likely cysts or hamartomas.  Bilateral T2 hyperintense renal cortical cysts are reidentified. 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 Adrenal glands are normal. No focal splenic lesion. No intrahepatic, common duct, or pancreatic ductal dilatation. No lymphadenopathy. Visualized bowel is unremarkable.  After administration of contrast, visualization is again suboptimal on multiple series because of patient respiratory motion. Allowing for this, no abnormal enhancement is  seen. There is inhomogeneous hypoenhancement of the pancreas.  On heavily T2 weighted sequences, No common duct filling defect, focal cut off, or other abnormality is identified.  IMPRESSION: Findings compatible with mild-to-moderate pancreatitis without complicating features such as peripancreatic fluid collection to suggest pseudocyst formation.  No common duct filling defect or contour abnormality.  Gallstones with mild gallbladder wall thickening and/or pericholecystic fluid compatible with hypoproteinemia/changes of cirrhosis and portal hypertension. Coincident cholecystitis would be significantly less likely.   Electronically Signed   By: Conchita Paris M.D.   On: 07/29/2014 13:14   Dg Chest Portable 1 View  07/29/2014   CLINICAL DATA:  Acute onset of chest pain.  EXAM: PORTABLE CHEST - 1 VIEW  COMPARISON:  Chest radiograph performed 07/12/2014  FINDINGS: The lungs are well-aerated. Vascular congestion is noted, without significant pulmonary edema. There is no evidence of focal opacification, pleural effusion or pneumothorax.  The cardiomediastinal silhouette is borderline enlarged. No acute osseous abnormalities are seen. Hardware is partially imaged at the right humeral head.  IMPRESSION: Vascular congestion and borderline cardiomegaly, without significant pulmonary edema.   Electronically Signed   By: Garald Balding M.D.   On: 07/29/2014 00:23   Mr Abd W/wo Cm/mrcp  07/29/2014   CLINICAL DATA:  Cirrhosis and portal hypertension. Reported diagnosis of pancreatitis  EXAM: MRI ABDOMEN WITHOUT AND WITH CONTRAST (INCLUDING MRCP)  TECHNIQUE: Multiplanar multisequence MR imaging of the abdomen was performed both before and after the administration of intravenous contrast. Heavily T2-weighted images of the biliary and pancreatic ducts were obtained, and three-dimensional MRCP images were rendered by post processing.  CONTRAST:  16mL MULTIHANCE GADOBENATE DIMEGLUMINE 529 MG/ML IV SOLN  COMPARISON:  The most  recent similar comparison exam is abdominal ultrasound 11/19/2013 and CT abdomen/ pelvis 04/26/2013. MRI abdomen 06/29/2011.  FINDINGS: Multiple series are degraded by patient motion. There is mild to moderate diffuse pancreatic edema and trace peripancreatic fluid. Nodular hepatic contour and splenomegaly reidentified compatible with previously diagnosed cirrhosis and evidence of portal hypertension. Trace pericholecystic fluid and or gallbladder wall thickening reidentified with dependent tiny stones noted. 1.3 cm cyst at the dome of the right hepatic lobe reidentified as well as T2 hyperintense hepatic lesions measuring less than 5 mm statistically most likely cysts  or hamartomas.  Bilateral T2 hyperintense renal cortical cysts are reidentified. 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 Adrenal glands are normal. No focal splenic lesion. No intrahepatic, common duct, or pancreatic ductal dilatation. No lymphadenopathy. Visualized bowel is unremarkable.  After administration of contrast, visualization is again suboptimal on multiple series because of patient respiratory motion. Allowing for this, no abnormal enhancement is seen. There is inhomogeneous hypoenhancement of the pancreas.  On heavily T2 weighted sequences, No common duct filling defect, focal cut off, or other abnormality is identified.  IMPRESSION: Findings compatible with mild-to-moderate pancreatitis without complicating features such as peripancreatic fluid collection to suggest pseudocyst formation.  No common duct filling defect or contour abnormality.  Gallstones with mild gallbladder wall thickening and/or pericholecystic fluid compatible with hypoproteinemia/changes of cirrhosis and portal hypertension. Coincident cholecystitis would be significantly less likely.   Electronically Signed   By: Conchita Paris M.D.   On: 07/29/2014 13:14    Microbiology: Recent Results (from the past 240 hour(s))  URINE CULTURE     Status:  None   Collection Time    08/17/14  2:39 PM      Result Value Ref Range Status   Specimen Description URINE, CATHETERIZED   Final   Special Requests NONE   Final   Culture  Setup Time     Final   Value: 08/17/2014 23:38     Performed at [facility]     Final   Value: >=100,000 COLONIES/ML     Performed at [facility]   Culture     Final   Value: ESCHERICHIA COLI     Performed at [facility]   Report Status PENDING   Incomplete     Labs: Basic Metabolic Panel:  Recent Labs Lab 08/17/14 1500 08/18/14 0439 08/18/14 0441 08/19/14 0758  NA 140  --  142 140  K 3.9  --  3.6* 4.0  CL 102  --  104 104  CO2 25  --  26 26  GLUCOSE 200*  --  154* 181*  BUN 15  --  14 13  CREATININE 0.63  --  0.64 0.63  CALCIUM 8.8  --  8.5 8.7  MG  --  1.9  --   --    Liver Function Tests:  Recent Labs Lab 08/17/14 1500 08/18/14 0441 08/19/14 0758  AST 26 21 18   ALT 14 11 11   ALKPHOS 112 97 102  BILITOT 1.6* 1.4* 1.5*  PROT 6.1 5.4* 5.5*  ALBUMIN 3.1* 2.8* 2.8*   No results found for this basename: LIPASE, AMYLASE,  in the last 168 hours  Recent Labs Lab 08/17/14 1501 08/18/14 1221 08/19/14 0758  AMMONIA 140* 91* 91*   CBC:  Recent Labs Lab 08/17/14 1500 08/18/14 0441 08/19/14 0758  WBC 4.4 3.3* 3.1*  NEUTROABS 3.1  --   --   HGB 12.3 10.8* 11.3*  HCT 38.1 33.0* 34.8*  MCV 78.1 77.8* 78.2  PLT 138* 148* 133*   Cardiac Enzymes:  Recent Labs Lab 08/17/14 1502  TROPONINI <0.30   BNP: BNP (last 3 results) No results found for this basename: PROBNP,  in the last 8760 hours CBG:  Recent Labs Lab 08/18/14 1141 08/18/14 1709 08/18/14 2042 08/19/14 0717 08/19/14 1103  GLUCAP 152* 176* 200* 160* 195*       Signed:  HERNANDEZ ACOSTA,ESTELA  [facility] Pager: [phone] 08/19/2014, 12:04 PM

## 2014-08-19 NOTE — Evaluation (Signed)
Occupational Therapy Evaluation Patient Details Name: [name] [name] MRN: [id number] DOB: 1941/09/29 Today's Date: 08/19/2014    History of Present Illness [name] [name] is a 73 y.o. female came to [facility] ed 08/17/2014 with  Confusion. Started about 7 days ago. Recurring issue for pt. Getting worse. Associated w/ general weakness. Family unable to care for pt at home. Denies frequency, dysuria, abd pain, SOB, CP. Not taking Lactulose TID   Clinical Impression   Pt is presenting to acute OT with above situation.  She has generalized weakness in BUE and decreased AROM in R shoulder due to exacerbation of shoulder fracture.  Per chart review, pt's husband has had increasing difficulty providing adequate assist to pt during her ADLs.  Pt will benefit form continued OT services to improve strength and ROM and improve overall ADL engagement.  Recommend SNF OT at this time.    Follow Up Recommendations  SNF    Equipment Recommendations  None recommended by OT    Recommendations for Other Services       Precautions / Restrictions Precautions Precautions: Fall Restrictions Weight Bearing Restrictions: No      Mobility Bed Mobility                  Transfers                      Balance                                            ADL Overall ADL's : Needs assistance/impaired Eating/Feeding: Set up     Grooming Details (indicate cue type and reason): able to compelte with L, non-dominant, hand only             Lower Body Dressing: Moderate assistance Lower Body Dressing Details (indicate cue type and reason): Able to doff sock, min assist to don. Will need assist with all lower body dressing tasks. Toilet Transfer: Moderate assistance;BSC                   Vision                     Perception     Praxis      Pertinent Vitals/Pain Pain Assessment: No/denies pain     Hand Dominance Right   Extremity/Trunk Assessment  Upper Extremity Assessment Upper Extremity Assessment: RUE deficits/detail;Generalized weakness RUE Deficits / Details: hx fracutre of R shoulder.  Limited AROM.  Generalized weakness otherwise.   Lower Extremity Assessment Lower Extremity Assessment: Defer to PT evaluation       Communication Communication Communication: No difficulties   Cognition Arousal/Alertness: Awake/alert Behavior During Therapy: WFL for tasks assessed/performed Overall Cognitive Status: Within Functional Limits for tasks assessed                     General Comments       Exercises       Shoulder Instructions      Home Living Family/patient expects to be discharged to:: Skilled nursing facility Living Arrangements: Spouse/significant other Available Help at Discharge: Family;Available 24 hours/day Type of Home: House Home Access: Ramped entrance Entrance Stairs-Number of Steps: 3-4 Entrance Stairs-Rails: Can reach both Home Layout: One level     Bathroom Shower/Tub: Teacher, early years/pre: Standard     Home Equipment:  Walker - 2 wheels;Cane - single point;Bedside commode;Wheelchair - Press photographer;Shower seat          Prior Functioning/Environment Level of Independence: Needs assistance  Gait / Transfers Assistance Needed: Per pt her husband has been helping her with bed mobility and transfers.  Limited ambulation reported, with pt spending most of her time in the bed or W/C.  Pt able to self propel W/C with her feet.  ADL's / Homemaking Assistance Needed: Pt reports husband assists with all IADLs and occassional assist with ADLs        OT Diagnosis: Generalized weakness   OT Problem List: Decreased strength;Decreased range of motion;Impaired UE functional use   OT Treatment/Interventions: Self-care/ADL training;Therapeutic exercise;Therapeutic activities;Manual therapy;Patient/family education;Balance training    OT Goals(Current goals can be found in  the care plan section) Acute Rehab OT Goals Patient Stated Goal: Wnats to be stronger OT Goal Formulation: With patient Time For Goal Achievement: 09/02/14 Potential to Achieve Goals: Fair ADL Goals Pt Will Transfer to Toilet: with min assist Pt Will Perform Toileting - Clothing Manipulation and hygiene: with min guard assist Pt/caregiver will Perform Home Exercise Program: Increased strength;Increased ROM;Both right and left upper extremity  OT Frequency: Min 2X/week   Barriers to D/C:            Co-evaluation              End of Session    Activity Tolerance: Patient tolerated treatment well Patient left: in bed;with call bell/phone within reach   Time: 0904-0929 OT Time Calculation (min): 25 min Charges:  OT General Charges $OT Visit: 1 Procedure OT Evaluation $Initial OT Evaluation Tier I: 1 Procedure G-Codes:     Bea Graff Herny Scurlock, MS, OTR/L [place] [phone] 08/19/2014, 9:36 AM

## 2014-08-19 NOTE — Clinical Social Work Note (Signed)
Pt d/c today to SNF. CSW presented available offers and pt and husband choose [facility]. Facility notified. All are aware and agreeable to d/c today. Pt's husband to set up transport via [facility]. D/C summary faxed.  [name], [place]

## 2014-08-19 NOTE — Care Management Note (Signed)
    Page 1 of 1   08/19/2014     2:58:25 PM CARE MANAGEMENT NOTE 08/19/2014  Patient:  [name] [name], [name] [name]   Account Number:  [id number]  Date Initiated:  08/19/2014  Documentation initiated by:  Vladimir Creeks  Subjective/Objective Assessment:   Admitted with hepatic encephalopahy. Pt is from home with spouse, but they are considering return to rehab. Pt was just at SNF recently, and spouse is having a difficult time caring for her at home. she needs to be stronger and more able to     Action/Plan:   care for herself at home. CSW spoke with them and the decided to do SNF again and chose [facility]   Anticipated DC Date:  08/19/2014   Anticipated DC Plan:  SKILLED NURSING FACILITY  In-house referral  Clinical Social Worker      DC Planning Services  CM consult      Choice offered to / List presented to:  C-1 Patient           Status of service:  Completed, signed off Medicare Important Message given?  NA - LOS <3 / Initial given by admissions (If response is "NO", the following Medicare IM given date fields will be blank) Date Medicare IM given:   Medicare IM given by:   Date Additional Medicare IM given:   Additional Medicare IM given by:    Discharge Disposition:  [place]  Per UR Regulation:  Reviewed for med. necessity/level of care/duration of stay  If discussed at [place] of Stay Meetings, dates discussed:    Comments:  08/19/14 [place] RN/CM

## 2014-08-20 LAB — URINE CULTURE

## 2014-09-03 ENCOUNTER — Other Ambulatory Visit: Payer: Self-pay

## 2014-09-03 MED ORDER — FUROSEMIDE 20 MG PO TABS: 20.0000 mg | ORAL_TABLET | Freq: Every morning | ORAL | Status: DC

## 2014-09-10 ENCOUNTER — Encounter: Payer: Self-pay | Admitting: Internal Medicine

## 2014-09-10 ENCOUNTER — Ambulatory Visit (INDEPENDENT_AMBULATORY_CARE_PROVIDER_SITE_OTHER): Payer: Medicare Other | Admitting: Internal Medicine

## 2014-09-10 VITALS — BP 116/66 | HR 65 | Temp 98.3°F | Ht 68.0 in | Wt 228.0 lb

## 2014-09-10 DIAGNOSIS — K7290 Hepatic failure, unspecified without coma: Secondary | ICD-10-CM

## 2014-09-10 DIAGNOSIS — K7469 Other cirrhosis of liver: Secondary | ICD-10-CM

## 2014-09-10 DIAGNOSIS — K7682 Hepatic encephalopathy: Secondary | ICD-10-CM

## 2014-09-10 NOTE — Progress Notes (Signed)
Primary Care Physician:  Purvis Kilts, MD Primary Gastroenterologist:  Dr. Gala Romney  Pre-Procedure History & Physical: HPI:  [name] [name] is a 73 y.o. female here for followup of [name] [name] cirrhosis. Recent episodes of biliary pancreatitis. Known cholelithiasis. Saw Dr. Eugenia Pancoast at [facility]. We discussed. She may have a surgical mortality risk of upwards of 30%. We all mutually decided on conservative management continuing Actigall chronically. She's done well since I last saw her. She has actually lost 14 pounds watching her salt/fluid intake. Chronic right shoulder pain related to fracture. She does take oxycodone twice daily which does affect her mental status. Lactulose managing encephalopathy well; otherwise, GERD symptoms well controlled on Nexium. No varices on EGD earlier this year.  Past Medical History  Diagnosis Date  . CAD in native artery   . Thrombocytopenia, unspecified   . Unspecified hereditary and idiopathic peripheral neuropathy   . Unspecified fall   . Other and unspecified hyperlipidemia   . Unspecified essential hypertension   . Obesity, unspecified   . Personal history of neurosis   . Intrinsic asthma, unspecified   . Allergic rhinitis, cause unspecified   . Cirrhosis of liver     NASH, afp on 06/17/12 =3.7, per pt she had hep B vaccines in 1996, hep A in process.   . Colon adenomas 03/08/10    tcs by Dr. Gala Romney  . Hyperplastic polyps of stomach 03/08/10    tcs by Dr. Dudley Major  . Chronic gastritis 03/09/11    egd by Dr. Gala Romney  . History of hemorrhoids 03/08/10    tcs- internal and external  . Diverticula of colon 03/08/10    L side  . Esophagitis, erosive 03/08/10  . GERD (gastroesophageal reflux disease)   . Wears glasses   . Type II or unspecified type diabetes mellitus without mention of complication, not stated as uncontrolled   . Vertigo   . Pancreatitis   . Bilateral leg weakness   . Thrombocytopenia 04/12/2014  . Pancytopenia, acquired 04/12/2014  . Cirrhosis    . Abnormal EKG     hx of left anterior fasicular block on 04-09-13 ekg epic    Past Surgical History  Procedure Laterality Date  . Hysterectomy and btl      s/p  . Tumor excision  2003    rt arm and left foot  . Colonoscopy  03/08/10    Dr. [name]-->ext/int hemorrhoids, anal paipilla, rectal polyps, desc polyps, cecal polyp, left-sided diverticula. suboptiomal prep. next TCS 02/2015. multiple adenomas  . Esophagogastroduodenoscopy  03/08/10    Dr. [name] erosive RE, small hh, antral erosions, small 1cm are of mucosal indentation along gastric body of doubtful significance, cystic nodularity of hypophyarynx, base of tongue, base of epiglottis. benign gastric biopsies  . Tubal ligation    . Esophagogastroduodenoscopy  10/08/2011    Dr. [name] esophageal varices, antral erosion. next egd 09/2013  . Foot surgery  2003    lt foot  . Eye surgery      cataracts bilateral  . Orif humerus fracture Right 02/17/2013    Procedure: OPEN REDUCTION INTERNAL FIXATION (ORIF) RIGHT PROXIMAL HUMERUS FRACTURE;  Surgeon: [name], MD;  Location: [place];  Service: Orthopedics;  Laterality: Right;  . Partial gastrectomy      family denies  . Esophagogastroduodenoscopy N/A 02/17/2014    Dr. Gala Romney: portal gastropathy, no varices. Due for surveillance 2017  . Breast surgery Right few yrs ago    benign area removed  . Abdominal hysterectomy    .  Cardiac catheterization  02/25/2007    Dr. [name]:  normal LV systolic function, mild irregularities of LAD    Prior to Admission medications   Medication Sig Start Date End Date Taking? Authorizing Provider  ALPRAZolam Duanne Moron) 0.5 MG tablet Take 1 tablet (0.5 mg total) by mouth 3 (three) times daily as needed for anxiety. 08/19/14  Yes Erline Hau, MD  aspirin EC 325 MG EC tablet Take 1 tablet (325 mg total) by mouth every morning. 07/14/14  Yes Nita Sells, MD  budesonide-formoterol (SYMBICORT) 160-4.5 MCG/ACT inhaler Inhale 2 puffs  into the lungs at bedtime.    Yes Historical Provider, MD  ciprofloxacin (CIPRO) 500 MG tablet Take 1 tablet (500 mg total) by mouth 2 (two) times daily. 08/19/14  Yes Estela Leonie Green, MD  ESTRACE VAGINAL 0.1 MG/GM vaginal cream Place 1 Applicatorful vaginally every other day. At  bedtime 08/11/13  Yes Historical Provider, MD  furosemide (LASIX) 20 MG tablet Take 1 tablet (20 mg total) by mouth every morning. 09/03/14  Yes Mahala Menghini, PA-C  insulin aspart (NOVOLOG FLEXPEN) 100 UNIT/ML FlexPen Inject 2-10 Units into the skin 3 (three) times daily with meals. Sliding Scale: 200-250= 2 units 251-300= 4 units 301-350= 6 units 351-400= 8 units If levels are over 400, GIVE 10 UNITS AND CALL MD   Yes Historical Provider, MD  insulin detemir (LEVEMIR) 100 UNIT/ML injection Inject 60 Units into the skin at bedtime.    Yes Historical Provider, MD  lactulose, encephalopathy, (GENERLAC) 10 GM/15ML SOLN Take 45 mLs (30 g total) by mouth 3 (three) times daily. 06/28/14  Yes Kathie Dike, MD  meclizine (ANTIVERT) 25 MG tablet Take 25 mg by mouth 3 (three) times daily as needed for dizziness or nausea.    Yes Historical Provider, MD  Multiple Vitamins-Minerals (CENTRUM SILVER PO) Take 1 tablet by mouth daily.    Yes Historical Provider, MD  NEXIUM 40 MG capsule Take 40 mg by mouth every morning.  06/17/14  Yes Historical Provider, MD  ondansetron (ZOFRAN) 4 MG tablet Take 1 tablet (4 mg total) by mouth every 6 (six) hours as needed for nausea. 08/06/14  Yes Radene Gunning, NP  OxyCODONE (OXYCONTIN) 10 mg T12A 12 hr tablet Take 1 tablet (10 mg total) by mouth 2 (two) times daily as needed (severe pain). 08/19/14  Yes Erline Hau, MD  pregabalin (LYRICA) 100 MG capsule Take 100 mg by mouth 2 (two) times daily.   Yes Historical Provider, MD  quinapril (ACCUPRIL) 5 MG tablet Take 5 mg by mouth at bedtime.   Yes Historical Provider, MD  rifaximin (XIFAXAN) 550 MG TABS tablet Take 1 tablet (550 mg  total) by mouth 2 (two) times daily. 06/28/14  Yes Kathie Dike, MD  spironolactone (ALDACTONE) 50 MG tablet Take 50 mg by mouth every morning.   Yes Historical Provider, MD  triamcinolone cream (KENALOG) 0.1 % Apply 1 application topically daily as needed (for irritation).  06/26/13  Yes Historical Provider, MD  ursodiol (URSO FORTE) 500 MG tablet Take 500 mg by mouth 2 (two) times daily.   Yes Historical Provider, MD    Allergies as of 09/10/2014 - Review Complete 09/10/2014  Allergen Reaction Noted  . Morphine and related Itching 04/26/2013  . Prochlorperazine edisylate Other (See Comments)   . Compazine [prochlorperazine edisylate] Anxiety 06/04/2012    Family History  Problem Relation Age of Onset  . Coronary artery disease      FH  . Diabetes  FH  . Heart disease Mother   . Colon cancer Neg Hx     History   Social History  . Marital Status: Married    Spouse Name: N/A    Number of Children: 3  . Years of Education: N/A   Occupational History  . Not on file.   Social History Main Topics  . Smoking status: Never Smoker   . Smokeless tobacco: Never Used  . Alcohol Use: No  . Drug Use: No  . Sexual Activity: Not Currently   Other Topics Concern  . Not on file   Social History Narrative  . No narrative on file    Review of Systems: See HPI, otherwise negative ROS  Physical Exam: BP 116/66  Pulse 65  Temp(Src) 98.3 F (36.8 C) (Oral)  Ht 5\' 8"  (1.727 m)  Wt 228 lb (103.42 kg)  BMI 34.68 kg/m2 General:   Alert,   pleasant and cooperative in NAD. Accompanied by her husband. Examined in the wheelchair. Skin:  Intact without significant lesions or rashes. Eyes:  Sclera clear, no icterus.   Conjunctiva pink. Ears:  Normal auditory acuity. Nose:  No deformity, discharge,  or lesions. Mouth:  No deformity or lesions. Neck:  Supple; no masses or thyromegaly. No significant cervical adenopathy. Lungs:  Clear throughout to auscultation.   No wheezes,  crackles, or rhonchi. No acute distress. Heart:  Regular rate and rhythm; no murmurs, clicks, rubs,  or gallops. Abdomen: Non-distended, normal bowel sounds.  Soft and nontender without appreciable mass or hepatosplenomegaly.  Pulses:  Normal pulses noted. Extremities:  Without clubbing or edema.  Impression:  [place]/cirrhosis with recurrent encephalopathy. Oxycodone not helping. Recent recurrent bouts of biliary pancreatitis.  Her situation poses significant risk of perioperative mortality. Conservative management is the game plan. GERD symptoms well controlled on Nexium.   Recommendations:Continue Nexium daily  Continue Actigall  Continue Lactulose  Minimize use of oxycodone  Liver ultrasound in 6 months; office visit just after ultrasound        Notice: This dictation was prepared with Dragon dictation along with smaller phrase technology. Any transcriptional errors that result from this process are unintentional and may not be corrected upon review.

## 2014-09-10 NOTE — Patient Instructions (Signed)
Continue Nexium daily  Continue Actigall  Continue Lactulose  Minimize use of oxycodone  Liver ultrasound in 6 months; office visit just after ultrasound

## 2014-10-15 ENCOUNTER — Encounter (HOSPITAL_COMMUNITY): Payer: Self-pay | Admitting: *Deleted

## 2014-10-15 ENCOUNTER — Emergency Department (HOSPITAL_COMMUNITY)
Admission: EM | Admit: 2014-10-15 | Discharge: 2014-10-15 | Disposition: A | Discharge status: Home / Self Care | Payer: Medicare Other | Attending: Emergency Medicine | Admitting: Emergency Medicine

## 2014-10-15 DIAGNOSIS — E669 Obesity, unspecified: Secondary | ICD-10-CM | POA: Insufficient documentation

## 2014-10-15 DIAGNOSIS — I10 Essential (primary) hypertension: Secondary | ICD-10-CM | POA: Insufficient documentation

## 2014-10-15 DIAGNOSIS — Z794 Long term (current) use of insulin: Secondary | ICD-10-CM | POA: Insufficient documentation

## 2014-10-15 DIAGNOSIS — Z86010 Personal history of colonic polyps: Secondary | ICD-10-CM | POA: Diagnosis not present

## 2014-10-15 DIAGNOSIS — Z7982 Long term (current) use of aspirin: Secondary | ICD-10-CM | POA: Diagnosis not present

## 2014-10-15 DIAGNOSIS — G8929 Other chronic pain: Secondary | ICD-10-CM | POA: Diagnosis not present

## 2014-10-15 DIAGNOSIS — K219 Gastro-esophageal reflux disease without esophagitis: Secondary | ICD-10-CM | POA: Insufficient documentation

## 2014-10-15 DIAGNOSIS — E119 Type 2 diabetes mellitus without complications: Secondary | ICD-10-CM | POA: Diagnosis not present

## 2014-10-15 DIAGNOSIS — Z862 Personal history of diseases of the blood and blood-forming organs and certain disorders involving the immune mechanism: Secondary | ICD-10-CM | POA: Insufficient documentation

## 2014-10-15 DIAGNOSIS — R531 Weakness: Secondary | ICD-10-CM | POA: Diagnosis present

## 2014-10-15 DIAGNOSIS — I2510 Atherosclerotic heart disease of native coronary artery without angina pectoris: Secondary | ICD-10-CM | POA: Diagnosis not present

## 2014-10-15 DIAGNOSIS — J45909 Unspecified asthma, uncomplicated: Secondary | ICD-10-CM | POA: Diagnosis not present

## 2014-10-15 DIAGNOSIS — Z79899 Other long term (current) drug therapy: Secondary | ICD-10-CM | POA: Insufficient documentation

## 2014-10-15 DIAGNOSIS — R7989 Other specified abnormal findings of blood chemistry: Secondary | ICD-10-CM

## 2014-10-15 DIAGNOSIS — F1310 Sedative, hypnotic or anxiolytic abuse, uncomplicated: Secondary | ICD-10-CM | POA: Diagnosis not present

## 2014-10-15 DIAGNOSIS — E7220 Disorder of urea cycle metabolism, unspecified: Secondary | ICD-10-CM | POA: Diagnosis not present

## 2014-10-15 DIAGNOSIS — Z7952 Long term (current) use of systemic steroids: Secondary | ICD-10-CM | POA: Diagnosis not present

## 2014-10-15 DIAGNOSIS — E785 Hyperlipidemia, unspecified: Secondary | ICD-10-CM | POA: Insufficient documentation

## 2014-10-15 HISTORY — DX: Pain in unspecified shoulder: M25.519

## 2014-10-15 HISTORY — DX: Other chronic pain: G89.29

## 2014-10-15 LAB — CBC WITH DIFFERENTIAL/PLATELET
BASOS ABS: 0 10*3/uL (ref 0.0–0.1)
Basophils Relative: 0 % (ref 0–1)
EOS ABS: 0.1 10*3/uL (ref 0.0–0.7)
Eosinophils Relative: 2 % (ref 0–5)
HCT: 36.2 % (ref 36.0–46.0)
HEMOGLOBIN: 11.8 g/dL — AB (ref 12.0–15.0)
Lymphocytes Relative: 12 % (ref 12–46)
Lymphs Abs: 0.8 10*3/uL (ref 0.7–4.0)
MCH: 26.2 pg (ref 26.0–34.0)
MCHC: 32.6 g/dL (ref 30.0–36.0)
MCV: 80.4 fL (ref 78.0–100.0)
Monocytes Absolute: 0.6 10*3/uL (ref 0.1–1.0)
Monocytes Relative: 9 % (ref 3–12)
NEUTROS ABS: 5.2 10*3/uL (ref 1.7–7.7)
NEUTROS PCT: 77 % (ref 43–77)
PLATELETS: 141 10*3/uL — AB (ref 150–400)
RBC: 4.5 MIL/uL (ref 3.87–5.11)
RDW: 14.9 % (ref 11.5–15.5)
WBC: 6.7 10*3/uL (ref 4.0–10.5)

## 2014-10-15 LAB — COMPREHENSIVE METABOLIC PANEL
ALK PHOS: 118 U/L — AB (ref 39–117)
ALT: 10 U/L (ref 0–35)
ANION GAP: 12 (ref 5–15)
AST: 14 U/L (ref 0–37)
Albumin: 2.9 g/dL — ABNORMAL LOW (ref 3.5–5.2)
BILIRUBIN TOTAL: 1.6 mg/dL — AB (ref 0.3–1.2)
BUN: 17 mg/dL (ref 6–23)
CO2: 25 meq/L (ref 19–32)
Calcium: 8.5 mg/dL (ref 8.4–10.5)
Chloride: 100 mEq/L (ref 96–112)
Creatinine, Ser: 0.78 mg/dL (ref 0.50–1.10)
GFR, EST NON AFRICAN AMERICAN: 82 mL/min — AB (ref >90)
GLUCOSE: 197 mg/dL — AB (ref 70–99)
POTASSIUM: 3.8 meq/L (ref 3.7–5.3)
Sodium: 137 mEq/L (ref 137–147)
Total Protein: 6.1 g/dL (ref 6.0–8.3)

## 2014-10-15 LAB — URINALYSIS, ROUTINE W REFLEX MICROSCOPIC
GLUCOSE, UA: NEGATIVE mg/dL
Hgb urine dipstick: NEGATIVE
KETONES UR: NEGATIVE mg/dL
Leukocytes, UA: NEGATIVE
NITRITE: NEGATIVE
PROTEIN: NEGATIVE mg/dL
Specific Gravity, Urine: 1.025 (ref 1.005–1.030)
Urobilinogen, UA: 2 mg/dL — ABNORMAL HIGH (ref 0.0–1.0)
pH: 5.5 (ref 5.0–8.0)

## 2014-10-15 LAB — TROPONIN I

## 2014-10-15 LAB — RAPID URINE DRUG SCREEN, HOSP PERFORMED
AMPHETAMINES: NOT DETECTED
Barbiturates: NOT DETECTED
Benzodiazepines: POSITIVE — AB
Cocaine: NOT DETECTED
Opiates: NOT DETECTED
Tetrahydrocannabinol: NOT DETECTED

## 2014-10-15 LAB — PROTIME-INR
INR: 1.18 (ref 0.00–1.49)
PROTHROMBIN TIME: 15.2 s (ref 11.6–15.2)

## 2014-10-15 LAB — AMMONIA: AMMONIA: 91 umol/L — AB (ref 11–60)

## 2014-10-15 LAB — LIPASE, BLOOD: LIPASE: 24 U/L (ref 11–59)

## 2014-10-15 MED ORDER — RIFAXIMIN 550 MG PO TABS
550.0000 mg | ORAL_TABLET | Freq: Once | ORAL | Status: AC
  Administered 2014-10-15: 550 mg via ORAL
  Filled 2014-10-15: qty 1

## 2014-10-15 MED ORDER — SODIUM CHLORIDE 0.9 % IV BOLUS (SEPSIS)
1000.0000 mL | Freq: Once | INTRAVENOUS | Status: AC
  Administered 2014-10-15: 1000 mL via INTRAVENOUS

## 2014-10-15 NOTE — ED Notes (Signed)
Pt says she is "afraid that my ammonia level is up".  Feels confused.

## 2014-10-15 NOTE — ED Notes (Signed)
During orthostatic vitals while standing patient became shaky and dizzy. Family helped assist patient back to bed. RN made aware.

## 2014-10-15 NOTE — ED Provider Notes (Signed)
CSN: [id number]     Arrival date & time 10/15/14  1145 History  This chart was scribed for Nat Christen, MD by Edison Simon, ED Scribe. This patient was seen in room APA07/APA07 and the patient's care was started at 1:26 PM.    Chief Complaint  Patient presents with  . Weakness   The history is provided by the patient and the spouse. No language interpreter was used.    HPI Comments: [name] [name] is a 73 y.o. female with history of cirrhosis who presents to the Emergency Department complaining of lightheadedness for the past few days with associated generalized weakness and drowsiness. She expresses concern about possible elevated ammonia levels; she states that she had a previous episode of elevated ammonia during which she had generalized weakness. She states previous episode was due to cirrhosis of her liver caused by medication. Per husband, she has been ambulating with a wheelchair at home though she is able to make short walks from her room to the bathroom; this is unchanged from baseline. She has cholelithiasis, but as it stands right now, her doctors do not plan to operate due to her liver problem. She denies chest pain, shortness of breath, or trouble urinating.  Past Medical History  Diagnosis Date  . CAD in native artery   . Thrombocytopenia, unspecified   . Unspecified hereditary and idiopathic peripheral neuropathy   . Unspecified fall   . Other and unspecified hyperlipidemia   . Unspecified essential hypertension   . Obesity, unspecified   . Personal history of neurosis   . Intrinsic asthma, unspecified   . Allergic rhinitis, cause unspecified   . Cirrhosis of liver     NASH, afp on 06/17/12 =3.7, per pt she had hep B vaccines in 1996, hep A in process.   . Colon adenomas 03/08/10    tcs by Dr. Gala Romney  . Hyperplastic polyps of stomach 03/08/10    tcs by Dr. Dudley Major  . Chronic gastritis 03/09/11    egd by Dr. Gala Romney  . History of hemorrhoids 03/08/10    tcs- internal and external   . Diverticula of colon 03/08/10    L side  . Esophagitis, erosive 03/08/10  . GERD (gastroesophageal reflux disease)   . Wears glasses   . Type II or unspecified type diabetes mellitus without mention of complication, not stated as uncontrolled   . Vertigo   . Pancreatitis   . Bilateral leg weakness   . Thrombocytopenia 04/12/2014  . Pancytopenia, acquired 04/12/2014  . Cirrhosis   . Abnormal EKG     hx of left anterior fasicular block on 04-09-13 ekg epic  . Chronic shoulder pain    Past Surgical History  Procedure Laterality Date  . Hysterectomy and btl      s/p  . Tumor excision  2003    rt arm and left foot  . Colonoscopy  03/08/10    Dr. [name]-->ext/int hemorrhoids, anal paipilla, rectal polyps, desc polyps, cecal polyp, left-sided diverticula. suboptiomal prep. next TCS 02/2015. multiple adenomas  . Esophagogastroduodenoscopy  03/08/10    Dr. [name] erosive RE, small hh, antral erosions, small 1cm are of mucosal indentation along gastric body of doubtful significance, cystic nodularity of hypophyarynx, base of tongue, base of epiglottis. benign gastric biopsies  . Tubal ligation    . Esophagogastroduodenoscopy  10/08/2011    Dr. [name] esophageal varices, antral erosion. next egd 09/2013  . Foot surgery  2003    lt foot  . Eye surgery  cataracts bilateral  . Orif humerus fracture Right 02/17/2013    Procedure: OPEN REDUCTION INTERNAL FIXATION (ORIF) RIGHT PROXIMAL HUMERUS FRACTURE;  Surgeon: [name], MD;  Location: [place];  Service: Orthopedics;  Laterality: Right;  . Partial gastrectomy      family denies  . Esophagogastroduodenoscopy N/A 02/17/2014    Dr. Gala Romney: portal gastropathy, no varices. Due for surveillance 2017  . Breast surgery Right few yrs ago    benign area removed  . Abdominal hysterectomy    . Cardiac catheterization  02/25/2007    Dr. [name]:  normal LV systolic function, mild irregularities of LAD   Family History  Problem Relation Age of  Onset  . Coronary artery disease      FH  . Diabetes      FH  . Heart disease Mother   . Colon cancer Neg Hx    History  Substance Use Topics  . Smoking status: Never Smoker   . Smokeless tobacco: Never Used  . Alcohol Use: No   OB History    No data available     Review of Systems A complete 10 system review of systems was obtained and all systems are negative except as noted in the HPI and PMH.    Allergies  Morphine and related and Compazine  Home Medications   Prior to Admission medications   Medication Sig Start Date End Date Taking? Authorizing Provider  ALPRAZolam Duanne Moron) 0.5 MG tablet Take 1 tablet (0.5 mg total) by mouth 3 (three) times daily as needed for anxiety. 08/19/14  Yes Erline Hau, MD  aspirin EC 325 MG EC tablet Take 1 tablet (325 mg total) by mouth every morning. 07/14/14  Yes Nita Sells, MD  budesonide-formoterol (SYMBICORT) 160-4.5 MCG/ACT inhaler Inhale 2 puffs into the lungs at bedtime.    Yes Historical Provider, MD  enalapril (VASOTEC) 5 MG tablet Take 1 tablet by mouth daily. 08/25/14  Yes Historical Provider, MD  ESTRACE VAGINAL 0.1 MG/GM vaginal cream Place 1 Applicatorful vaginally every other day. At  bedtime 08/11/13  Yes Historical Provider, MD  furosemide (LASIX) 20 MG tablet Take 1 tablet (20 mg total) by mouth every morning. 09/03/14  Yes Mahala Menghini, PA-C  insulin aspart (NOVOLOG FLEXPEN) 100 UNIT/ML FlexPen Inject 2-10 Units into the skin 3 (three) times daily with meals. Sliding Scale: 200-250= 2 units 251-300= 4 units 301-350= 6 units 351-400= 8 units If levels are over 400, GIVE 10 UNITS AND CALL MD   Yes Historical Provider, MD  insulin detemir (LEVEMIR) 100 UNIT/ML injection Inject 60 Units into the skin at bedtime.    Yes Historical Provider, MD  lactulose, encephalopathy, (GENERLAC) 10 GM/15ML SOLN Take 45 mLs (30 g total) by mouth 3 (three) times daily. 06/28/14  Yes Kathie Dike, MD  meclizine (ANTIVERT)  25 MG tablet Take 25 mg by mouth 3 (three) times daily as needed for dizziness or nausea.    Yes Historical Provider, MD  Multiple Vitamins-Minerals (CENTRUM SILVER PO) Take 1 tablet by mouth daily.    Yes Historical Provider, MD  NEXIUM 40 MG capsule Take 40 mg by mouth every morning.  06/17/14  Yes Historical Provider, MD  ondansetron (ZOFRAN) 4 MG tablet Take 1 tablet (4 mg total) by mouth every 6 (six) hours as needed for nausea. 08/06/14  Yes Radene Gunning, NP  OxyCODONE (OXYCONTIN) 10 mg T12A 12 hr tablet Take 1 tablet (10 mg total) by mouth 2 (two) times daily as needed (  severe pain). 08/19/14  Yes Erline Hau, MD  pregabalin (LYRICA) 100 MG capsule Take 100 mg by mouth 2 (two) times daily.   Yes Historical Provider, MD  quinapril (ACCUPRIL) 5 MG tablet Take 5 mg by mouth at bedtime.   Yes Historical Provider, MD  spironolactone (ALDACTONE) 50 MG tablet Take 50 mg by mouth every morning.   Yes Historical Provider, MD  triamcinolone cream (KENALOG) 0.1 % Apply 1 application topically daily as needed (for irritation).  06/26/13  Yes Historical Provider, MD  ursodiol (URSO FORTE) 500 MG tablet Take 500 mg by mouth 2 (two) times daily.   Yes Historical Provider, MD  ciprofloxacin (CIPRO) 500 MG tablet Take 1 tablet (500 mg total) by mouth 2 (two) times daily. Patient not taking: Reported on 10/15/2014 08/19/14   Erline Hau, MD  rifaximin (XIFAXAN) 550 MG TABS tablet Take 1 tablet (550 mg total) by mouth 2 (two) times daily. Patient not taking: Reported on 10/15/2014 06/28/14   Kathie Dike, MD   BP 114/43 mmHg  Pulse 71  Temp(Src) 99 F (37.2 C) (Oral)  Resp 11  Ht 5\' 8"  (1.727 m)  Wt 230 lb (104.327 kg)  BMI 34.98 kg/m2  SpO2 98% Physical Exam  Constitutional: She is oriented to person, place, and time. She appears well-developed.  Slightly obese responds slowly but appropriately to questions  HENT:  Head: Normocephalic and atraumatic.  Eyes: Conjunctivae and  EOM are normal. Pupils are equal, round, and reactive to light.  Neck: Normal range of motion. Neck supple.  Cardiovascular: Normal rate, regular rhythm and normal heart sounds.   Pulmonary/Chest: Effort normal and breath sounds normal.  Abdominal: Soft. Bowel sounds are normal.  Musculoskeletal: Normal range of motion.  Neurological: She is alert and oriented to person, place, and time.  Conversant No focal deficits  Skin: Skin is warm and dry.  Psychiatric: She has a normal mood and affect. Her behavior is normal.  Nursing note and vitals reviewed.   ED Course  Procedures (including critical care time)  DIAGNOSTIC STUDIES: Oxygen Saturation is 97% on room iar, normal by my interpretation.   Basic test including ammonia  COORDINATION OF CARE: 1:29 PM Discussed treatment plan with patient at beside, the patient agrees with the plan and has no further questions at this time.   Labs Review Labs Reviewed  COMPREHENSIVE METABOLIC PANEL - Abnormal; Notable for the following:    Glucose, Bld 197 (*)    Albumin 2.9 (*)    Alkaline Phosphatase 118 (*)    Total Bilirubin 1.6 (*)    GFR calc non Af Amer 82 (*)    All other components within normal limits  CBC WITH DIFFERENTIAL - Abnormal; Notable for the following:    Hemoglobin 11.8 (*)    Platelets 141 (*)    All other components within normal limits  AMMONIA - Abnormal; Notable for the following:    Ammonia 91 (*)    All other components within normal limits  URINALYSIS, ROUTINE W REFLEX MICROSCOPIC - Abnormal; Notable for the following:    Bilirubin Urine SMALL (*)    Urobilinogen, UA 2.0 (*)    All other components within normal limits  URINE RAPID DRUG SCREEN (HOSP PERFORMED) - Abnormal; Notable for the following:    Benzodiazepines POSITIVE (*)    All other components within normal limits  URINE CULTURE  LIPASE, BLOOD  PROTIME-INR  TROPONIN I    Imaging Review No results found.  EKG Interpretation None       Date: 10/15/2014  Rate: 74  Rhythm: normal sinus rhythm  QRS Axis: normal  Intervals: normal  ST/T Wave abnormalities: normal  Conduction Disutrbances: LAFB  Narrative Interpretation: unremarkable    MDM   Final diagnoses:  Increased ammonia level   Family reports patient is near baseline. Ammonia level elevated. Findings were discussed with the family. Also discussed with Dr. Laural Golden.  He recommended increasing lactulose. Patient will see her gastroenterologist early in the week.  I personally performed the services described in this documentation, which was scribed in my presence. The recorded information has been reviewed and is accurate.     Nat Christen, MD 10/15/14 [phone]

## 2014-10-15 NOTE — Discharge Instructions (Signed)
Ammonia level today was 91.    Follow-up with Dr. Buford Dresser early next week.    Increase your lactulose approximately 50%.

## 2014-10-15 NOTE — ED Notes (Signed)
States she feels much better since IV fluids. Fully alert,able to move self with some assistance on stretcher. States she has 2 rifaximin tablets at home, per dr Lacinda Axon pt is to take them tomorrow twice and follow up with gastroenterologist

## 2014-10-15 NOTE — ED Notes (Signed)
Phlebotomy at bedside.

## 2014-10-17 LAB — URINE CULTURE: Colony Count: 50000

## 2014-10-18 ENCOUNTER — Other Ambulatory Visit: Payer: Self-pay

## 2014-10-19 ENCOUNTER — Other Ambulatory Visit: Payer: Self-pay

## 2014-10-19 MED ORDER — SPIRONOLACTONE 50 MG PO TABS: 50.0000 mg | ORAL_TABLET | Freq: Every morning | ORAL | Status: DC

## 2014-10-20 ENCOUNTER — Encounter (HOSPITAL_COMMUNITY): Payer: Self-pay | Admitting: *Deleted

## 2014-10-20 ENCOUNTER — Emergency Department (HOSPITAL_COMMUNITY): Payer: Medicare Other

## 2014-10-20 ENCOUNTER — Other Ambulatory Visit: Payer: Self-pay

## 2014-10-20 ENCOUNTER — Emergency Department (HOSPITAL_COMMUNITY)
Admission: EM | Admit: 2014-10-20 | Discharge: 2014-10-20 | Disposition: A | Discharge status: Home / Self Care | Payer: Medicare Other | Attending: Emergency Medicine | Admitting: Emergency Medicine

## 2014-10-20 DIAGNOSIS — Z794 Long term (current) use of insulin: Secondary | ICD-10-CM | POA: Insufficient documentation

## 2014-10-20 DIAGNOSIS — Z86010 Personal history of colonic polyps: Secondary | ICD-10-CM | POA: Insufficient documentation

## 2014-10-20 DIAGNOSIS — I10 Essential (primary) hypertension: Secondary | ICD-10-CM | POA: Diagnosis present

## 2014-10-20 DIAGNOSIS — E785 Hyperlipidemia, unspecified: Secondary | ICD-10-CM | POA: Diagnosis not present

## 2014-10-20 DIAGNOSIS — I2510 Atherosclerotic heart disease of native coronary artery without angina pectoris: Secondary | ICD-10-CM | POA: Diagnosis not present

## 2014-10-20 DIAGNOSIS — Z8719 Personal history of other diseases of the digestive system: Secondary | ICD-10-CM | POA: Insufficient documentation

## 2014-10-20 DIAGNOSIS — E669 Obesity, unspecified: Secondary | ICD-10-CM | POA: Diagnosis not present

## 2014-10-20 DIAGNOSIS — Z7982 Long term (current) use of aspirin: Secondary | ICD-10-CM | POA: Insufficient documentation

## 2014-10-20 DIAGNOSIS — Z7952 Long term (current) use of systemic steroids: Secondary | ICD-10-CM | POA: Diagnosis not present

## 2014-10-20 DIAGNOSIS — J0140 Acute pansinusitis, unspecified: Secondary | ICD-10-CM

## 2014-10-20 DIAGNOSIS — M6281 Muscle weakness (generalized): Secondary | ICD-10-CM | POA: Diagnosis not present

## 2014-10-20 DIAGNOSIS — Z79899 Other long term (current) drug therapy: Secondary | ICD-10-CM | POA: Insufficient documentation

## 2014-10-20 DIAGNOSIS — Z792 Long term (current) use of antibiotics: Secondary | ICD-10-CM | POA: Insufficient documentation

## 2014-10-20 DIAGNOSIS — G8929 Other chronic pain: Secondary | ICD-10-CM | POA: Insufficient documentation

## 2014-10-20 DIAGNOSIS — R5381 Other malaise: Secondary | ICD-10-CM | POA: Diagnosis present

## 2014-10-20 DIAGNOSIS — E119 Type 2 diabetes mellitus without complications: Secondary | ICD-10-CM | POA: Diagnosis not present

## 2014-10-20 DIAGNOSIS — R531 Weakness: Secondary | ICD-10-CM

## 2014-10-20 DIAGNOSIS — K219 Gastro-esophageal reflux disease without esophagitis: Secondary | ICD-10-CM | POA: Diagnosis present

## 2014-10-20 DIAGNOSIS — K7469 Other cirrhosis of liver: Secondary | ICD-10-CM | POA: Diagnosis present

## 2014-10-20 DIAGNOSIS — Z9889 Other specified postprocedural states: Secondary | ICD-10-CM | POA: Insufficient documentation

## 2014-10-20 DIAGNOSIS — K7460 Unspecified cirrhosis of liver: Secondary | ICD-10-CM | POA: Diagnosis present

## 2014-10-20 DIAGNOSIS — K7581 Nonalcoholic steatohepatitis (NASH): Secondary | ICD-10-CM

## 2014-10-20 DIAGNOSIS — R4182 Altered mental status, unspecified: Secondary | ICD-10-CM | POA: Diagnosis present

## 2014-10-20 LAB — URINALYSIS, ROUTINE W REFLEX MICROSCOPIC
BILIRUBIN URINE: NEGATIVE
GLUCOSE, UA: NEGATIVE mg/dL
Hgb urine dipstick: NEGATIVE
KETONES UR: NEGATIVE mg/dL
Leukocytes, UA: NEGATIVE
Nitrite: NEGATIVE
PH: 5.5 (ref 5.0–8.0)
Protein, ur: NEGATIVE mg/dL
Specific Gravity, Urine: >1.03 — ABNORMAL HIGH (ref 1.005–1.030)
Urobilinogen, UA: 1 mg/dL (ref 0.0–1.0)

## 2014-10-20 LAB — AMMONIA: Ammonia: 81 umol/L — ABNORMAL HIGH (ref 11–60)

## 2014-10-20 LAB — COMPREHENSIVE METABOLIC PANEL
ALBUMIN: 2.7 g/dL — AB (ref 3.5–5.2)
ALK PHOS: 121 U/L — AB (ref 39–117)
ALT: 8 U/L (ref 0–35)
AST: 13 U/L (ref 0–37)
Anion gap: 14 (ref 5–15)
BILIRUBIN TOTAL: 1.2 mg/dL (ref 0.3–1.2)
BUN: 17 mg/dL (ref 6–23)
CHLORIDE: 100 meq/L (ref 96–112)
CO2: 24 meq/L (ref 19–32)
Calcium: 8.7 mg/dL (ref 8.4–10.5)
Creatinine, Ser: 0.85 mg/dL (ref 0.50–1.10)
GFR calc Af Amer: 78 mL/min — ABNORMAL LOW (ref >90)
GFR, EST NON AFRICAN AMERICAN: 67 mL/min — AB (ref >90)
Glucose, Bld: 205 mg/dL — ABNORMAL HIGH (ref 70–99)
POTASSIUM: 3.9 meq/L (ref 3.7–5.3)
Sodium: 138 mEq/L (ref 137–147)
Total Protein: 6.4 g/dL (ref 6.0–8.3)

## 2014-10-20 LAB — TROPONIN I: Troponin I: <0.3 ng/mL (ref <0.30)

## 2014-10-20 LAB — CBC WITH DIFFERENTIAL/PLATELET
BASOS ABS: 0 10*3/uL (ref 0.0–0.1)
BASOS PCT: 0 % (ref 0–1)
Eosinophils Absolute: 0.1 10*3/uL (ref 0.0–0.7)
Eosinophils Relative: 1 % (ref 0–5)
HCT: 38.9 % (ref 36.0–46.0)
HEMOGLOBIN: 12.7 g/dL (ref 12.0–15.0)
LYMPHS PCT: 12 % (ref 12–46)
Lymphs Abs: 1.2 10*3/uL (ref 0.7–4.0)
MCH: 26.3 pg (ref 26.0–34.0)
MCHC: 32.6 g/dL (ref 30.0–36.0)
MCV: 80.7 fL (ref 78.0–100.0)
Monocytes Absolute: 1 10*3/uL (ref 0.1–1.0)
Monocytes Relative: 11 % (ref 3–12)
NEUTROS PCT: 76 % (ref 43–77)
Neutro Abs: 7.3 10*3/uL (ref 1.7–7.7)
Platelets: 232 10*3/uL (ref 150–400)
RBC: 4.82 MIL/uL (ref 3.87–5.11)
RDW: 15.1 % (ref 11.5–15.5)
WBC: 9.6 10*3/uL (ref 4.0–10.5)

## 2014-10-20 LAB — INFLUENZA PANEL BY PCR (TYPE A & B)
H1N1 flu by pcr: NOT DETECTED
INFLBPCR: NEGATIVE
Influenza A By PCR: NEGATIVE

## 2014-10-20 LAB — PROTIME-INR
INR: 1.28 (ref 0.00–1.49)
Prothrombin Time: 16.1 seconds — ABNORMAL HIGH (ref 11.6–15.2)

## 2014-10-20 LAB — LACTIC ACID, PLASMA: Lactic Acid, Venous: 2.7 mmol/L — ABNORMAL HIGH (ref 0.5–2.2)

## 2014-10-20 MED ORDER — OXYCODONE HCL ER 10 MG PO T12A: 10.0000 mg | EXTENDED_RELEASE_TABLET | Freq: Two times a day (BID) | ORAL | Status: DC | PRN

## 2014-10-20 MED ORDER — NALOXONE HCL 0.4 MG/ML IJ SOLN
0.4000 mg | Freq: Once | INTRAMUSCULAR | Status: AC
  Administered 2014-10-20: 0.4 mg via INTRAVENOUS
  Filled 2014-10-20: qty 1

## 2014-10-20 MED ORDER — ALPRAZOLAM 0.5 MG PO TABS: 0.5000 mg | ORAL_TABLET | Freq: Three times a day (TID) | ORAL | Status: DC | PRN

## 2014-10-20 MED ORDER — IPRATROPIUM BROMIDE 0.06 % NA SOLN
1.0000 | Freq: Four times a day (QID) | NASAL | Status: DC | PRN
  Filled 2014-10-20: qty 15

## 2014-10-20 MED ORDER — AMOXICILLIN-POT CLAVULANATE 875-125 MG PO TABS
1.0000 | ORAL_TABLET | Freq: Two times a day (BID) | ORAL | Status: DC
  Filled 2014-10-20: qty 1

## 2014-10-20 NOTE — Discharge Instructions (Signed)
°Emergency Department Resource Guide °1) Find a Doctor and Pay Out of Pocket °Although you won't have to find out who is covered by your insurance plan, it is a good idea to ask around and get recommendations. You will then need to call the office and see if the doctor you have chosen will accept you as a new patient and what types of options they offer for patients who are self-pay. Some doctors offer discounts or will set up payment plans for their patients who do not have insurance, but you will need to ask so you aren't surprised when you get to your appointment. ° °2) Contact Your Local Health Department °Not all health departments have doctors that can see patients for sick visits, but many do, so it is worth a call to see if yours does. If you don't know where your local health department is, you can check in your phone book. The CDC also has a tool to help you locate your state's health department, and many state websites also have listings of all of their local health departments. ° °3) Find a Walk-in Clinic °If your illness is not likely to be very severe or complicated, you may want to try a walk in clinic. These are popping up all over the country in pharmacies, drugstores, and shopping centers. They're usually staffed by nurse practitioners or physician assistants that have been trained to treat common illnesses and complaints. They're usually fairly quick and inexpensive. However, if you have serious medical issues or chronic medical problems, these are probably not your best option. ° °No Primary Care Doctor: °- Call [facility] at  832-8000 - they can help you locate a primary care doctor that  accepts your insurance, provides certain services, etc. °- Physician Referral Service- 1-800-533-3463 ° °Chronic Pain Problems: °Organization         Address  Phone   Notes  °[facility]  (336) 297-2271 Patients need to be referred by their primary care doctor.  ° °Medication  Assistance: °Organization         Address  Phone   Notes  °[place] [facility] [address] °[place], [place] [address] (336) 641-8030 --Must be a resident of [place] °-- Must have NO insurance coverage whatsoever (no Medicaid/ Medicare, etc.) °-- The pt. MUST have a primary care doctor that directs their care regularly and follows them in the community °  °[facility]  (866) 331-1348   °[employer]  (888) 892-1162   ° °Agencies that provide inexpensive medical care: °Organization         Address  Phone   Notes  °[facility]  (336) 832-8035   °[facility]    (336) 832-7272   °[facility] [address] °[place], [place] [address] (336) 832-4777   °[facility] [facility] [address], °[place] (336) 271-4999   °[facility]    (336) 373-0678   °[facility]    (336) 272-1050   °[facility] ° [address], [place] Phone:  (336) 832-4444, Fax:  (336) 832-4440 Hours of Operation:  9 am - 6 pm, M-F.  Also accepts Medicaid/Medicare and self-pay.  °[facility] ° [address], [place] Phone: (336) 832-3150, Fax: (336) 832-3151. Hours of Operation:  8:30 am - 5:30 pm, M-F.  Also accepts Medicaid and self-pay.  °[facility] [place] [address], [place] Phone: (336) 878-6027   °[facility] [address], [place], [place] (336)723-1848, Ext. 123 Mondays & Thursdays: 7-9 AM.  First 15 patients are seen on a first come, first serve basis. °  ° °Medicaid-accepting [place] Providers: ° °Organization         Address  Phone   Notes  °[facility] [address], [place] (336) 641-2100 Also accepts self-pay patients.  °[facility] [address], [place] ° (336) 856-9996   °[facility] [address], [place]  (336) 288-8857   °[facility] [address] [place] [address], [place] (336) 299-7000   °Veita Bland [address], [place]  ° (336) 373-1557 Only accepts [place] Access Medicaid patients after they have their name applied to their card.  ° °Self-Pay (no insurance) in [place]: ° °Organization         Address  Phone   Notes  °Sickle Cell Patients, [facility] [address], [place] (336) 832-1970   °[facility] [address], [place] (336) 832-4400   °[facility] [place] [facility] ° [address], [place] (336) 992-4800   °[facility]/Dr. Osei-Bonsu ° [address] [place] [address], [place] or [address], [place] (336) 841-8500 Phone number for both [place] and [place] locations is the same.  °[facility] [address], [place] (336) 299-0000   °[facility] [address] [place] [address], [place] or [address] (336) 852-7530 °(336) 878-2260   °[facility] [address], [place] (336) 350-1642, phone; (336) 294-5005, fax Sees patients 1st and 3rd Saturday of every month.  Must not qualify for public or private insurance (i.e. Medicaid, Medicare, [place] Health Choice, Veterans' Benefits) • Household income should be no more than 200% of the poverty level •The clinic cannot treat you if you are pregnant or think you are pregnant • Sexually transmitted diseases are not treated at the clinic.  ° ° °Dental Care: °Organization         Address  Phone  Notes  °[place] [facility] [address], [place] (336) 641-6152 Accepts children up to age 21 who are enrolled in Medicaid or [place] Health Choice; pregnant women with a Medicaid card; and children who have applied for Medicaid or [place] Health Choice, but were declined, whose parents can pay a reduced fee at time of service.  °[place]  [facility] [place]  [address], [place] (336) 641-7733 Accepts children up to age 21 who are enrolled in Medicaid or [place] Health Choice; pregnant women with a Medicaid card; and children who have applied for Medicaid or [place] Health Choice, but were declined, whose parents can pay a reduced fee at time of service.  °[facility] ° [address],  (336) 641-4533 Patients are seen by appointment only. Walk-ins are not accepted. [facility] will see patients 18 years of age and older. °Monday - Tuesday (8am-5pm) °Most Wednesdays (8:30-5pm) °$30 per visit, cash only  °[facility] ° [address], [place] (336) 641-4533 Patients are seen by appointment only. Walk-ins are not accepted. [facility] will see patients 18 years of age and older. °One   Wednesday Evening (Monthly: Volunteer Based).  $30 per visit, cash only  °[facility]  (919) 537-3737 for adults; Children under age 4, call Graduate Pediatric Dentistry at (919) [phone]. Children aged 4-14, please call (919) [phone] to request a pediatric application. ° Dental services are provided in all areas of dental care including fillings, crowns and bridges, complete and partial dentures, implants, gum treatment, root canals, and extractions. Preventive care is also provided. Treatment is provided to both adults and children. °Patients are selected via a lottery and there is often a waiting list. °  °[facility] [address], °[place] ° (336) 763-8833 [email or web address] °  °[facility] [address], [place], [place] (336)723-1848, Ext. 123 Second and Fourth Thursday of each month, opens at 6:30 AM; Clinic ends at 9 AM.  Patients are seen on a first-come first-served basis, and a limited number are seen during each clinic.  ° °[facility] ° [address], [place], [place] (336) 723-7904    Eligibility Requirements °You must have lived in [place], [place], or [place] counties for at least the last three months. °  You cannot be eligible for state or federal sponsored healthcare insurance, including Veterans Administration, Medicaid, or Medicare. °  You generally cannot be eligible for healthcare insurance through your employer.  °  How to apply: °Eligibility screenings are held every Tuesday and Wednesday afternoon from 1:00 pm until 4:00 pm. You do not need an appointment for the interview!  °[facility] [address], [place], [place] 336-631-2330   °[facility]  336-342-8273   °[place]  336-703-3100   °[facility]  336-570-6415   ° °Behavioral Health Resources in the Community: °Intensive Outpatient Programs °Organization         Address  Phone  Notes  °[facility] [address], [place], [place] 336-878-6098   °[facility] [address], [place], [place] 336-832-9800   °[facility] [address], [place], [place] ° 336-882-2125   °[facility] [address],  °[place], [place] 1-800-853-5163 or 336-641-4981   °Substance Abuse Resources °Organization         Address  Phone  Notes  °[facility]  336-882-2125   °[facility]  336-784-9470   °[facility]  336-285-9073   °[facility]  336-845-3988   °Residential & Outpatient Substance Abuse Program  1-800-659-3381   °Psychological Services °Organization         Address  Phone  Notes  °[facility]  336- 832-9600   °[facility]  336- 378-7881   °[facility] [address], [place] 1-800-853-5163 or 336-641-4981   ° °Mobile Crisis Teams °Organization         Address  Phone  Notes  °[facility], Mobile Crisis Care Unit  1-877-626-1772   °Assertive °[facility] ° [address]  [place], [place] 336-834-9664   °Sharon DeEsch [address] °[place] 336-554-5454   ° °Self-Help/Support Groups °Organization         Address  Phone             Notes  °[facility]  - variety of support groups  336- 373-1402 Call for more information  °Narcotics Anonymous (NA), [facility] [address], °[place]  2 meetings at this location  ° °  Residential Treatment Programs °Organization         Address  Phone  Notes  °[facility] [address],    °[place]  1-866-801-8205   °[facility] ° [address], [place], [place] 704-293-8524   °[facility] [address], [place] 336-845-3988 Admissions: 8am-3pm M-F  °[facility] [address]    °[place], [place] 336-841-1104   °[facility] [address], [place], [place] 336-379-7146   °[facility] [address]  °[place], [place] 336-285-9073   °[facility] Intensive Outpatient [address], [place], [place] 336-852-3033   °[facility] [address]  °[place], [place] 1-877-615-2722 or 336-784-9470   °[facility] [address] [place], [place] 336-227-7417 Accepts Medicaid  °[facility] [address]  °[place] 1-800-659-3381 Substance Abuse/Addiction Treatment  ° °[place] Behavioral Health Resources °Organization         Address  Phone  Notes  °[facility]  (888) 581-9988   °Julie Brannon, PhD [address] [place], [place]   (336) 349-5553 or (336) 951-0000   °[facility]   [address] °[place], [place] (336) 349-4454   °[facility] [address], [place], [place] (336) 342-8316 Insurance/Medicaid/sponsorship through [facility]  °[facility] [address]                                    [place], [place] (336) 342-8316 Therapy/tele-psych/case    °[facility] [address]  ° [place], [place] (336) 349-2233    °Dr. Arfeen  (336) 349-4544   °[facility] [place]  [employer] [place] [facility] 1) [address],  °2) [address], [place] °3)  [address], [place] (336) 349-3220 °(336) 342-7768 ° °(336) 342-8140   °[place] [facility] (336) 342-1394 or (336) 342-3537 (After Hours)    ° ° ° °Take your usual prescriptions as previously directed.  Call your regular medical doctor tomorrow to schedule a follow up appointment within the next 2 days. Return to the Emergency Department immediately sooner if worsening.  ° °

## 2014-10-20 NOTE — ED Notes (Signed)
Husband states pt was here on Friday and dx with increased ammonia level. States she has gotten worse, stating, "She has really been out there, I guess her ammonia level is higher" Also states pt has been unable to stand since Sunday.

## 2014-10-20 NOTE — ED Notes (Signed)
Pt is unable to the stand without having two people holding her up.

## 2014-10-20 NOTE — Clinical Social Work Note (Signed)
CSW received call from EDP stating that pt's husband is unable to continue providing care at home as pt is very weak. She recently d/c home from [facility]. CSW met with pt and husband in ED. They report that SNF helped pt last time and pt would like to return to [facility]. Husband states that he is unable to meet pt's needs as she is requiring 2 person assit for transfers. CSW spoke with Jackelyn Poling at facility who was already aware of situation and is agreeable to accept pt from ED. FL2 completed. EDP completing note with medications. RN will fax both to [facility] and pt's husband will take her.   [name], [place]

## 2014-10-20 NOTE — ED Notes (Signed)
Pt DC, going to [facility] for direct admit from ED.

## 2014-10-20 NOTE — Consult Note (Signed)
[facility] Consult Note  [name] DPO:[id number] DOB: [birth year] DOA: 10/20/2014  Referring physician: Dr. Thurnell Garbe - [facility] PCP: Purvis Kilts, MD   Chief Complaint: Weakness  HPI: [name] [name] is a 73 y.o. female  Generalized weakness. Started 5 days ago. No focal deficits. Cared for at home by husband who states he is unable to lift and help patient with basic acitvities of daily living. Pt admitted in early October with similar symptoms and did well after discharge to a nursing home ([facility]). After going home pt continued to decline. Husband was worried that her ammonia level was high so he was giving her an extra dose of lactulose daily. THis resulted initiallly w/ increased bowel movements (TID) but none for the past 2 days. Denies HA, seizure, nausea, vomiting, syncope, focal deficit  Also w/ cough adn congestion for 10 days. Feels she is getting worse overall. Tried Mucinex w/o benefit. Husband w/ similar symptoms. Denies fevers,, SOB, CP. Occasional wheeze. No Albuterol inh at home.    Review of Systems:  Per HPI w/ all other systems negative  Past Medical History  Diagnosis Date  . CAD in native artery   . Thrombocytopenia, unspecified   . Unspecified hereditary and idiopathic peripheral neuropathy   . Unspecified fall   . Other and unspecified hyperlipidemia   . Unspecified essential hypertension   . Obesity, unspecified   . Personal history of neurosis   . Intrinsic asthma, unspecified   . Allergic rhinitis, cause unspecified   . Cirrhosis of liver     NASH, afp on 06/17/12 =3.7, per pt she had hep B vaccines in 1996, hep A in process.   . Colon adenomas 03/08/10    tcs by Dr. Gala Romney  . Hyperplastic polyps of stomach 03/08/10    tcs by Dr. Dudley Major  . Chronic gastritis 03/09/11    egd by Dr. Gala Romney  . History of hemorrhoids 03/08/10    tcs- internal and external  . Diverticula of colon 03/08/10    L side  . Esophagitis, erosive 03/08/10  . GERD  (gastroesophageal reflux disease)   . Wears glasses   . Type II or unspecified type diabetes mellitus without mention of complication, not stated as uncontrolled   . Vertigo   . Pancreatitis   . Bilateral leg weakness   . Thrombocytopenia 04/12/2014  . Pancytopenia, acquired 04/12/2014  . Cirrhosis   . Abnormal EKG     hx of left anterior fasicular block on 04-09-13 ekg epic  . Chronic shoulder pain    Past Surgical History  Procedure Laterality Date  . Hysterectomy and btl      s/p  . Tumor excision  2003    rt arm and left foot  . Colonoscopy  03/08/10    Dr. [name]-->ext/int hemorrhoids, anal paipilla, rectal polyps, desc polyps, cecal polyp, left-sided diverticula. suboptiomal prep. next TCS 02/2015. multiple adenomas  . Esophagogastroduodenoscopy  03/08/10    Dr. [name] erosive RE, small hh, antral erosions, small 1cm are of mucosal indentation along gastric body of doubtful significance, cystic nodularity of hypophyarynx, base of tongue, base of epiglottis. benign gastric biopsies  . Tubal ligation    . Esophagogastroduodenoscopy  10/08/2011    Dr. [name] esophageal varices, antral erosion. next egd 09/2013  . Foot surgery  2003    lt foot  . Eye surgery      cataracts bilateral  . Orif humerus fracture Right 02/17/2013    Procedure: OPEN REDUCTION INTERNAL FIXATION (ORIF)  RIGHT PROXIMAL HUMERUS FRACTURE;  Surgeon: [name], MD;  Location: [facility];  Service: Orthopedics;  Laterality: Right;  . Partial gastrectomy      family denies  . Esophagogastroduodenoscopy N/A 02/17/2014    Dr. Gala Romney: portal gastropathy, no varices. Due for surveillance 2017  . Breast surgery Right few yrs ago    benign area removed  . Abdominal hysterectomy    . Cardiac catheterization  02/25/2007    Dr. [name]:  normal LV systolic function, mild irregularities of LAD   Social History:  reports that she has never smoked. She has never used smokeless tobacco. She reports that she does not drink  alcohol or use illicit drugs.  Allergies  Allergen Reactions  . Morphine And Related Itching  . Compazine [Prochlorperazine Edisylate] Anxiety    Causes anxiety & nervousness    Family History  Problem Relation Age of Onset  . Coronary artery disease      FH  . Diabetes      FH  . Heart disease Mother   . Colon cancer Neg Hx      Prior to Admission medications   Medication Sig Start Date End Date Taking? Authorizing Provider  ALPRAZolam Duanne Moron) 0.5 MG tablet Take 1 tablet (0.5 mg total) by mouth 3 (three) times daily as needed for anxiety. 08/19/14  Yes Erline Hau, MD  aspirin EC 325 MG EC tablet Take 1 tablet (325 mg total) by mouth every morning. 07/14/14  Yes Nita Sells, MD  budesonide-formoterol (SYMBICORT) 160-4.5 MCG/ACT inhaler Inhale 2 puffs into the lungs at bedtime.    Yes Historical Provider, MD  enalapril (VASOTEC) 5 MG tablet Take 1 tablet by mouth daily. 08/25/14  Yes Historical Provider, MD  ESTRACE VAGINAL 0.1 MG/GM vaginal cream Place 1 Applicatorful vaginally every other day. At  bedtime 08/11/13  Yes Historical Provider, MD  furosemide (LASIX) 20 MG tablet Take 1 tablet (20 mg total) by mouth every morning. 09/03/14  Yes Mahala Menghini, PA-C  insulin aspart (NOVOLOG FLEXPEN) 100 UNIT/ML FlexPen Inject 2-10 Units into the skin 3 (three) times daily with meals. Sliding Scale: 200-250= 2 units 251-300= 4 units 301-350= 6 units 351-400= 8 units If levels are over 400, GIVE 10 UNITS AND CALL MD   Yes Historical Provider, MD  insulin detemir (LEVEMIR) 100 UNIT/ML injection Inject 60 Units into the skin at bedtime.    Yes Historical Provider, MD  lactulose, encephalopathy, (GENERLAC) 10 GM/15ML SOLN Take 45 mLs (30 g total) by mouth 3 (three) times daily. 06/28/14  Yes Kathie Dike, MD  meclizine (ANTIVERT) 25 MG tablet Take 25 mg by mouth 3 (three) times daily as needed for dizziness or nausea.    Yes Historical Provider, MD  Multiple  Vitamins-Minerals (CENTRUM SILVER PO) Take 1 tablet by mouth daily.    Yes Historical Provider, MD  NEXIUM 40 MG capsule Take 40 mg by mouth every morning.  06/17/14  Yes Historical Provider, MD  ondansetron (ZOFRAN) 4 MG tablet Take 1 tablet (4 mg total) by mouth every 6 (six) hours as needed for nausea. 08/06/14  Yes Radene Gunning, NP  OxyCODONE (OXYCONTIN) 10 mg T12A 12 hr tablet Take 1 tablet (10 mg total) by mouth 2 (two) times daily as needed (severe pain). 08/19/14  Yes Erline Hau, MD  pregabalin (LYRICA) 100 MG capsule Take 100 mg by mouth 2 (two) times daily.   Yes Historical Provider, MD  quinapril (ACCUPRIL) 5 MG tablet Take 5  mg by mouth at bedtime.   Yes Historical Provider, MD  spironolactone (ALDACTONE) 50 MG tablet Take 1 tablet (50 mg total) by mouth every morning. 10/19/14  Yes Mahala Menghini, PA-C  ursodiol (URSO FORTE) 500 MG tablet Take 500 mg by mouth 2 (two) times daily.   Yes Historical Provider, MD  ciprofloxacin (CIPRO) 500 MG tablet Take 1 tablet (500 mg total) by mouth 2 (two) times daily. Patient not taking: Reported on 10/15/2014 08/19/14   Erline Hau, MD  rifaximin (XIFAXAN) 550 MG TABS tablet Take 1 tablet (550 mg total) by mouth 2 (two) times daily. Patient not taking: Reported on 10/15/2014 06/28/14   Kathie Dike, MD  triamcinolone cream (KENALOG) 0.1 % Apply 1 application topically daily as needed (for irritation).  06/26/13   Historical Provider, MD   Physical Exam: Filed Vitals:   10/20/14 1256 10/20/14 1430 10/20/14 1445 10/20/14 1530  BP: 116/53 116/61  121/59  Pulse: 82   73  Temp: 99.5 F (37.5 C)     TempSrc: Oral     Resp: 16  17 18   Height: 5\' 8"  (1.727 m)     Weight: 104.327 kg (230 lb)     SpO2: 100%   93%    Wt Readings from Last 3 Encounters:  10/20/14 104.327 kg (230 lb)  10/15/14 104.327 kg (230 lb)  09/10/14 103.42 kg (228 lb)    General:  Appears calm and comfortable Eyes:  PERRL, normal lids, irises &  conjunctiva ENT: frontal and maxillary sinuses ttp Neck:  no LAD, masses or thyromegaly Cardiovascular:  RRR, no m/r/g. 1+ LE edema Telemetry:  SR, no arrhythmias  Respiratory: mild intermittent wheezing. No ronchi, crackles. nml effort.  Abdomen:  soft, ntnd Skin:  no rash or induration seen on limited exam Musculoskeletal: Weak but pt able to pull self to sitting position for exam. Moves all extremities spontaneously  grossly normal tone BUE/BLE Psychiatric:  grossly normal mood and affect, speech fluent and appropriate Neurologic:  grossly non-focal.          Labs on Admission:  Basic Metabolic Panel:  Recent Labs Lab 10/15/14 1236 10/20/14 1311  NA 137 138  K 3.8 3.9  CL 100 100  CO2 25 24  GLUCOSE 197* 205*  BUN 17 17  CREATININE 0.78 0.85  CALCIUM 8.5 8.7   Liver Function Tests:  Recent Labs Lab 10/15/14 1236 10/20/14 1311  AST 14 13  ALT 10 8  ALKPHOS 118* 121*  BILITOT 1.6* 1.2  PROT 6.1 6.4  ALBUMIN 2.9* 2.7*    Recent Labs Lab 10/15/14 1236  LIPASE 24    Recent Labs Lab 10/15/14 1236 10/20/14 1307  AMMONIA 91* 81*   CBC:  Recent Labs Lab 10/15/14 1236 10/20/14 1311  WBC 6.7 9.6  NEUTROABS 5.2 7.3  HGB 11.8* 12.7  HCT 36.2 38.9  MCV 80.4 80.7  PLT 141* 232   Cardiac Enzymes:  Recent Labs Lab 10/15/14 1236 10/20/14 1311  TROPONINI <0.30 <0.30    BNP (last 3 results) No results for input(s): PROBNP in the last 8760 hours. CBG: No results for input(s): GLUCAP in the last 168 hours.  Radiological Exams on Admission: Ct Head Wo Contrast  10/20/2014   CLINICAL DATA:  Altered mental status.  EXAM: CT HEAD WITHOUT CONTRAST  TECHNIQUE: Contiguous axial images were obtained from the base of the skull through the vertex without intravenous contrast.  COMPARISON:  August 17, 2014.  FINDINGS: Bony calvarium is intact.  Bilateral ethmoid, sphenoid and maxillary sinusitis is noted. Mild diffuse cortical atrophy is noted. Minimal chronic  ischemic white matter disease is noted. No mass effect or midline shift is noted. Ventricular size is within normal limits. There is no evidence of mass lesion, hemorrhage or acute infarction.  IMPRESSION: Bilateral ethmoid, sphenoid and maxillary sinusitis. Mild diffuse cortical atrophy. Minimal chronic ischemic white matter disease. No acute intracranial abnormality seen.   Electronically Signed   By: Sabino Dick M.D.   On: 10/20/2014 14:40   Dg Chest Port 1 View  10/20/2014   CLINICAL DATA:  73 year old female with altered mental status presenting with productive cough of green mucus for the past week with shortness of breath.  EXAM: PORTABLE CHEST - 1 VIEW  COMPARISON:  Chest x-ray 07/28/2014.  FINDINGS: Film is underpenetrated limiting the diagnostic sensitivity and specificity of this examination. With these limitations in mind, no definite acute consolidative airspace disease is noted. There is some mild peribronchial cuffing. No pleural effusions. Heart size is mildly enlarged (unchanged). Pulmonary venous congestion, without frank pulmonary edema. The patient is rotated to the left on today's exam, resulting in distortion of the mediastinal contours and reduced diagnostic sensitivity and specificity for mediastinal pathology.  IMPRESSION: 1. Mild diffuse peribronchial cuffing may suggest a bronchitis. 2. Mild cardiomegaly.   Electronically Signed   By: Vinnie Langton M.D.   On: 10/20/2014 14:45    EKG: Independently reviewed. NSR, PVC,  Assessment/Plan Principal Problem:   Physical deconditioning Active Problems:   HLD (hyperlipidemia)   GERD   Essential hypertension   Generalized weakness   Liver cirrhosis secondary to NASH   Well controlled type 2 diabetes mellitus   # Physical deconditioning: waxing and waning pattern. Pt to be discharged to SNF ([facility]) from ED. Pt to discuss home nursing care w/ PCP on Friday at f/u appt.  - discharge to SNF [facility] signed and paperwork  faxed)  # Acute frontal and maxillary sinusitis: ongoing for >10 days. Favor treatment at this point as worsening and sinuses very ttp.CXR w/o PNA.   - Start 10 day course of Augmentin BID  - nasal atrovent for decongestion 1-2 sprays Q4-6hrs PRN nasal discharge.   Continue other medications which appear stable at this time as listed above for ongoing chronic illnesses.    Code Status: FULL  Family Communication: Husband present for ED visit Disposition Plan: SNF   Arhum Peeples, [place] [facility] [email or web address] Password [email or web address]

## 2014-10-20 NOTE — ED Provider Notes (Signed)
CSN: [id number]     Arrival date & time 10/20/14  1249 History   First MD Initiated Contact with Patient 10/20/14 1319     Chief Complaint  Patient presents with  . Altered Mental Status      HPI Pt was seen at 1325. Per pt and her family, c/o gradual onset and worsening of persistent generalized weakness that began 5 days ago. Has been associated with "drowsiness," sinus congestion, runny/stuffy nose, cough, and decreased appetite. Pt has been unable to stand for the past 3 days due to her generalized weakness. Pt's husband states he has been giving pt her lactulose twice a day (instead of once per day) thinking "maybe her ammonia level was high again." Denies N/V/D, no fevers, no abd pain, no SOB, no CP, no focal motor weakness, no syncope.    Past Medical History  Diagnosis Date  . CAD in native artery   . Thrombocytopenia, unspecified   . Unspecified hereditary and idiopathic peripheral neuropathy   . Unspecified fall   . Other and unspecified hyperlipidemia   . Unspecified essential hypertension   . Obesity, unspecified   . Personal history of neurosis   . Intrinsic asthma, unspecified   . Allergic rhinitis, cause unspecified   . Cirrhosis of liver     NASH, afp on 06/17/12 =3.7, per pt she had hep B vaccines in 1996, hep A in process.   . Colon adenomas 03/08/10    tcs by Dr. Gala Romney  . Hyperplastic polyps of stomach 03/08/10    tcs by Dr. Dudley Major  . Chronic gastritis 03/09/11    egd by Dr. Gala Romney  . History of hemorrhoids 03/08/10    tcs- internal and external  . Diverticula of colon 03/08/10    L side  . Esophagitis, erosive 03/08/10  . GERD (gastroesophageal reflux disease)   . Wears glasses   . Type II or unspecified type diabetes mellitus without mention of complication, not stated as uncontrolled   . Vertigo   . Pancreatitis   . Bilateral leg weakness   . Thrombocytopenia 04/12/2014  . Pancytopenia, acquired 04/12/2014  . Cirrhosis   . Abnormal EKG     hx of left anterior  fasicular block on 04-09-13 ekg epic  . Chronic shoulder pain    Past Surgical History  Procedure Laterality Date  . Hysterectomy and btl      s/p  . Tumor excision  2003    rt arm and left foot  . Colonoscopy  03/08/10    Dr. [name]-->ext/int hemorrhoids, anal paipilla, rectal polyps, desc polyps, cecal polyp, left-sided diverticula. suboptiomal prep. next TCS 02/2015. multiple adenomas  . Esophagogastroduodenoscopy  03/08/10    Dr. [name] erosive RE, small hh, antral erosions, small 1cm are of mucosal indentation along gastric body of doubtful significance, cystic nodularity of hypophyarynx, base of tongue, base of epiglottis. benign gastric biopsies  . Tubal ligation    . Esophagogastroduodenoscopy  10/08/2011    Dr. [name] esophageal varices, antral erosion. next egd 09/2013  . Foot surgery  2003    lt foot  . Eye surgery      cataracts bilateral  . Orif humerus fracture Right 02/17/2013    Procedure: OPEN REDUCTION INTERNAL FIXATION (ORIF) RIGHT PROXIMAL HUMERUS FRACTURE;  Surgeon: [name], MD;  Location: [facility];  Service: Orthopedics;  Laterality: Right;  . Partial gastrectomy      family denies  . Esophagogastroduodenoscopy N/A 02/17/2014    Dr. Gala Romney: portal gastropathy, no varices.  Due for surveillance 2017  . Breast surgery Right few yrs ago    benign area removed  . Abdominal hysterectomy    . Cardiac catheterization  02/25/2007    Dr. [name]:  normal LV systolic function, mild irregularities of LAD   Family History  Problem Relation Age of Onset  . Coronary artery disease      FH  . Diabetes      FH  . Heart disease Mother   . Colon cancer Neg Hx    History  Substance Use Topics  . Smoking status: Never Smoker   . Smokeless tobacco: Never Used  . Alcohol Use: No    Review of Systems ROS: Statement: All systems negative except as marked or noted in the HPI; Constitutional: Negative for fever and chills. ; ; Eyes: Negative for eye pain, redness and  discharge. ; ; ENMT: Negative for ear pain, hoarseness, sore throat. +nasal congestion, sinus pressure and rhinorrhea. ; ; Cardiovascular: Negative for chest pain, palpitations, diaphoresis, dyspnea and peripheral edema. ; ; Respiratory: +cough. Negative for wheezing and stridor. ; ; Gastrointestinal: +decreased appetite. Negative for nausea, vomiting, diarrhea, abdominal pain, blood in stool, hematemesis, jaundice and rectal bleeding. . ; ; Genitourinary: Negative for dysuria, flank pain and hematuria. ; ; Musculoskeletal: Negative for back pain and neck pain. Negative for swelling and trauma.; ; Skin: Negative for pruritus, rash, abrasions, blisters, bruising and skin lesion.; ; Neuro: +generalized weakness. Negative for headache, lightheadedness and neck stiffness. Negative for altered level of consciousness , altered mental status, extremity weakness, paresthesias, involuntary movement, seizure and syncope.     Allergies  Morphine and related and Compazine  Home Medications   Prior to Admission medications   Medication Sig Start Date End Date Taking? Authorizing Provider  ALPRAZolam Duanne Moron) 0.5 MG tablet Take 1 tablet (0.5 mg total) by mouth 3 (three) times daily as needed for anxiety. 08/19/14  Yes Erline Hau, MD  aspirin EC 325 MG EC tablet Take 1 tablet (325 mg total) by mouth every morning. 07/14/14  Yes Nita Sells, MD  budesonide-formoterol (SYMBICORT) 160-4.5 MCG/ACT inhaler Inhale 2 puffs into the lungs at bedtime.    Yes Historical Provider, MD  enalapril (VASOTEC) 5 MG tablet Take 1 tablet by mouth daily. 08/25/14  Yes Historical Provider, MD  ESTRACE VAGINAL 0.1 MG/GM vaginal cream Place 1 Applicatorful vaginally every other day. At  bedtime 08/11/13  Yes Historical Provider, MD  furosemide (LASIX) 20 MG tablet Take 1 tablet (20 mg total) by mouth every morning. 09/03/14  Yes Mahala Menghini, PA-C  insulin aspart (NOVOLOG FLEXPEN) 100 UNIT/ML FlexPen Inject 2-10 Units  into the skin 3 (three) times daily with meals. Sliding Scale: 200-250= 2 units 251-300= 4 units 301-350= 6 units 351-400= 8 units If levels are over 400, GIVE 10 UNITS AND CALL MD   Yes Historical Provider, MD  insulin detemir (LEVEMIR) 100 UNIT/ML injection Inject 60 Units into the skin at bedtime.    Yes Historical Provider, MD  lactulose, encephalopathy, (GENERLAC) 10 GM/15ML SOLN Take 45 mLs (30 g total) by mouth 3 (three) times daily. 06/28/14  Yes Kathie Dike, MD  meclizine (ANTIVERT) 25 MG tablet Take 25 mg by mouth 3 (three) times daily as needed for dizziness or nausea.    Yes Historical Provider, MD  Multiple Vitamins-Minerals (CENTRUM SILVER PO) Take 1 tablet by mouth daily.    Yes Historical Provider, MD  NEXIUM 40 MG capsule Take 40 mg by mouth every morning.  06/17/14  Yes Historical Provider, MD  ondansetron (ZOFRAN) 4 MG tablet Take 1 tablet (4 mg total) by mouth every 6 (six) hours as needed for nausea. 08/06/14  Yes Radene Gunning, NP  OxyCODONE (OXYCONTIN) 10 mg T12A 12 hr tablet Take 1 tablet (10 mg total) by mouth 2 (two) times daily as needed (severe pain). 08/19/14  Yes Erline Hau, MD  pregabalin (LYRICA) 100 MG capsule Take 100 mg by mouth 2 (two) times daily.   Yes Historical Provider, MD  quinapril (ACCUPRIL) 5 MG tablet Take 5 mg by mouth at bedtime.   Yes Historical Provider, MD  spironolactone (ALDACTONE) 50 MG tablet Take 1 tablet (50 mg total) by mouth every morning. 10/19/14  Yes Mahala Menghini, PA-C  ursodiol (URSO FORTE) 500 MG tablet Take 500 mg by mouth 2 (two) times daily.   Yes Historical Provider, MD  ciprofloxacin (CIPRO) 500 MG tablet Take 1 tablet (500 mg total) by mouth 2 (two) times daily. Patient not taking: Reported on 10/15/2014 08/19/14   Erline Hau, MD  rifaximin (XIFAXAN) 550 MG TABS tablet Take 1 tablet (550 mg total) by mouth 2 (two) times daily. Patient not taking: Reported on 10/15/2014 06/28/14   Kathie Dike, MD   triamcinolone cream (KENALOG) 0.1 % Apply 1 application topically daily as needed (for irritation).  06/26/13   Historical Provider, MD   BP 116/61 mmHg  Pulse 82  Temp(Src) 99.5 F (37.5 C) (Oral)  Resp 17  Ht 5\' 8"  (1.727 m)  Wt 230 lb (104.327 kg)  BMI 34.98 kg/m2  SpO2 100% Physical Exam  1330: Physical examination:  Nursing notes reviewed; Vital signs and O2 SAT reviewed;  Constitutional: Well developed, Well nourished, Well hydrated, In no acute distress; Head:  Normocephalic, atraumatic; Eyes: EOMI, PERRL, No scleral icterus; ENMT: TM's clear bilat. +edemetous nasal turbinates bilat with clear rhinorrhea. Mouth and pharynx normal, Mucous membranes moist; Neck: Supple, Full range of motion, No lymphadenopathy; Cardiovascular: Regular rate and rhythm, No gallop; Respiratory: Breath sounds clear & equal bilaterally, No wheezes.  Speaking full sentences with ease, Normal respiratory effort/excursion; Chest: Nontender, Movement normal; Abdomen: Soft, Nontender, Nondistended, Normal bowel sounds; Genitourinary: No CVA tenderness; Extremities: Pulses normal, No tenderness, No edema, No calf edema or asymmetry.; Neuro: Falls asleep easily, but becomes AA&Ox3 to her name.  Major CN grossly intact.  Speech clear. No facial droop. Grips equal. Strength 5/5 equal bilat UE's and LE's. No apparent gross focal motor or sensory deficits in extremities.; Skin: Color normal, Warm, Dry.   ED Course  Procedures     EKG Interpretation   Date/Time:  Wednesday October 20 2014 13:15:41 EST Ventricular Rate:  80 PR Interval:  117 QRS Duration: 98 QT Interval:  390 QTC Calculation: 450 R Axis:   -149 Text Interpretation:  Sinus rhythm Ventricular premature complex Left axis  deviation Left anterior fasicular block Artifact When compared with ECG of  08/17/2014 Premature ventricular complexes and artifact are now Present  Otherwise no significant change Confirmed by [facility]  MD, Nunzio Cory  [phone]) on  10/20/2014 3:24:36 PM      MDM  MDM Reviewed: previous chart, nursing note and vitals Reviewed previous: labs and ECG Interpretation: labs, ECG, x-ray and CT scan   Results for orders placed or performed during the hospital encounter of 10/20/14  Ammonia  Result Value Ref Range   Ammonia 81 (H) 11 - 60 umol/L  CBC with Differential  Result Value Ref Range  WBC 9.6 4.0 - 10.5 K/uL   RBC 4.82 3.87 - 5.11 MIL/uL   Hemoglobin 12.7 12.0 - 15.0 g/dL   HCT 38.9 36.0 - 46.0 %   MCV 80.7 78.0 - 100.0 fL   MCH 26.3 26.0 - 34.0 pg   MCHC 32.6 30.0 - 36.0 g/dL   RDW 15.1 11.5 - 15.5 %   Platelets 232 150 - 400 K/uL   Neutrophils Relative % 76 43 - 77 %   Neutro Abs 7.3 1.7 - 7.7 K/uL   Lymphocytes Relative 12 12 - 46 %   Lymphs Abs 1.2 0.7 - 4.0 K/uL   Monocytes Relative 11 3 - 12 %   Monocytes Absolute 1.0 0.1 - 1.0 K/uL   Eosinophils Relative 1 0 - 5 %   Eosinophils Absolute 0.1 0.0 - 0.7 K/uL   Basophils Relative 0 0 - 1 %   Basophils Absolute 0.0 0.0 - 0.1 K/uL  Comprehensive metabolic panel  Result Value Ref Range   Sodium 138 137 - 147 mEq/L   Potassium 3.9 3.7 - 5.3 mEq/L   Chloride 100 96 - 112 mEq/L   CO2 24 19 - 32 mEq/L   Glucose, Bld 205 (H) 70 - 99 mg/dL   BUN 17 6 - 23 mg/dL   Creatinine, Ser 0.85 0.50 - 1.10 mg/dL   Calcium 8.7 8.4 - 10.5 mg/dL   Total Protein 6.4 6.0 - 8.3 g/dL   Albumin 2.7 (L) 3.5 - 5.2 g/dL   AST 13 0 - 37 U/L   ALT 8 0 - 35 U/L   Alkaline Phosphatase 121 (H) 39 - 117 U/L   Total Bilirubin 1.2 0.3 - 1.2 mg/dL   GFR calc non Af Amer 67 (L) >90 mL/min   GFR calc Af Amer 78 (L) >90 mL/min   Anion gap 14 5 - 15  Urinalysis, Routine w reflex microscopic  Result Value Ref Range   Color, Urine YELLOW YELLOW   APPearance CLEAR CLEAR   Specific Gravity, Urine >1.030 (H) 1.005 - 1.030   pH 5.5 5.0 - 8.0   Glucose, UA NEGATIVE NEGATIVE mg/dL   Hgb urine dipstick NEGATIVE NEGATIVE   Bilirubin Urine NEGATIVE NEGATIVE   Ketones, ur NEGATIVE  NEGATIVE mg/dL   Protein, ur NEGATIVE NEGATIVE mg/dL   Urobilinogen, UA 1.0 0.0 - 1.0 mg/dL   Nitrite NEGATIVE NEGATIVE   Leukocytes, UA NEGATIVE NEGATIVE  Protime-INR  Result Value Ref Range   Prothrombin Time 16.1 (H) 11.6 - 15.2 seconds   INR 1.28 0.00 - 1.49  Troponin I  Result Value Ref Range   Troponin I <0.30 <0.30 ng/mL   Ct Head Wo Contrast 10/20/2014   CLINICAL DATA:  Altered mental status.  EXAM: CT HEAD WITHOUT CONTRAST  TECHNIQUE: Contiguous axial images were obtained from the base of the skull through the vertex without intravenous contrast.  COMPARISON:  August 17, 2014.  FINDINGS: Bony calvarium is intact. Bilateral ethmoid, sphenoid and maxillary sinusitis is noted. Mild diffuse cortical atrophy is noted. Minimal chronic ischemic white matter disease is noted. No mass effect or midline shift is noted. Ventricular size is within normal limits. There is no evidence of mass lesion, hemorrhage or acute infarction.  IMPRESSION: Bilateral ethmoid, sphenoid and maxillary sinusitis. Mild diffuse cortical atrophy. Minimal chronic ischemic white matter disease. No acute intracranial abnormality seen.   Electronically Signed   By: Sabino Dick M.D.   On: 10/20/2014 14:40   Dg Chest Port 1 View 10/20/2014  CLINICAL DATA:  73 year old female with altered mental status presenting with productive cough of green mucus for the past week with shortness of breath.  EXAM: PORTABLE CHEST - 1 VIEW  COMPARISON:  Chest x-ray 07/28/2014.  FINDINGS: Film is underpenetrated limiting the diagnostic sensitivity and specificity of this examination. With these limitations in mind, no definite acute consolidative airspace disease is noted. There is some mild peribronchial cuffing. No pleural effusions. Heart size is mildly enlarged (unchanged). Pulmonary venous congestion, without frank pulmonary edema. The patient is rotated to the left on today's exam, resulting in distortion of the mediastinal contours and  reduced diagnostic sensitivity and specificity for mediastinal pathology.  IMPRESSION: 1. Mild diffuse peribronchial cuffing may suggest a bronchitis. 2. Mild cardiomegaly.   Electronically Signed   By: Vinnie Langton M.D.   On: 10/20/2014 14:45    1540:  Pt unable to stand in the ED without heavy assist x2-3 ED staff. VS remain stable. No change in mental status after narcan. Neuro exam non-focal. No clear infection. Will obtain flu swab; admit. Dx and testing d/w pt and family.  Questions answered.  Verb understanding, agreeable to admit. T/C to [facility] Dr. Marily Memos, case discussed, including:  HPI, pertinent PM/SHx, VS/PE, dx testing, ED course and treatment:  Agreeable to come to ED for evaluation to admit.    1605:  [facility] Dr. Marily Memos has evaluated pt: states after extensively speaking w/family, they finally endorse they are having a difficult time caring for pt at home and are requesting SNF placement, SW to see pt in the ED for possible NH placement tonight (pt was d/c from NH within the past 30 days).   1645:  Pt has been accepted to SNF per SW. [facility] MD to complete consult note. Request EDP to d/c with rx xanax and oxycodone (NH needs hard copies).   Francine Graven, DO 10/24/14 1353

## 2014-10-21 LAB — URINE CULTURE
COLONY COUNT: NO GROWTH
CULTURE: NO GROWTH

## 2014-11-12 DIAGNOSIS — M6281 Muscle weakness (generalized): Secondary | ICD-10-CM | POA: Diagnosis not present

## 2014-11-12 DIAGNOSIS — R262 Difficulty in walking, not elsewhere classified: Secondary | ICD-10-CM | POA: Diagnosis not present

## 2014-11-12 DIAGNOSIS — R2681 Unsteadiness on feet: Secondary | ICD-10-CM | POA: Diagnosis not present

## 2014-11-12 DIAGNOSIS — R531 Weakness: Secondary | ICD-10-CM | POA: Diagnosis not present

## 2014-11-15 DIAGNOSIS — R2681 Unsteadiness on feet: Secondary | ICD-10-CM | POA: Diagnosis not present

## 2014-11-15 DIAGNOSIS — M6281 Muscle weakness (generalized): Secondary | ICD-10-CM | POA: Diagnosis not present

## 2014-11-15 DIAGNOSIS — R262 Difficulty in walking, not elsewhere classified: Secondary | ICD-10-CM | POA: Diagnosis not present

## 2014-11-15 DIAGNOSIS — R531 Weakness: Secondary | ICD-10-CM | POA: Diagnosis not present

## 2014-11-16 DIAGNOSIS — M6281 Muscle weakness (generalized): Secondary | ICD-10-CM | POA: Diagnosis not present

## 2014-11-16 DIAGNOSIS — R2681 Unsteadiness on feet: Secondary | ICD-10-CM | POA: Diagnosis not present

## 2014-11-16 DIAGNOSIS — R531 Weakness: Secondary | ICD-10-CM | POA: Diagnosis not present

## 2014-11-16 DIAGNOSIS — R262 Difficulty in walking, not elsewhere classified: Secondary | ICD-10-CM | POA: Diagnosis not present

## 2014-11-17 DIAGNOSIS — R531 Weakness: Secondary | ICD-10-CM | POA: Diagnosis not present

## 2014-11-17 DIAGNOSIS — M6281 Muscle weakness (generalized): Secondary | ICD-10-CM | POA: Diagnosis not present

## 2014-11-17 DIAGNOSIS — R2681 Unsteadiness on feet: Secondary | ICD-10-CM | POA: Diagnosis not present

## 2014-11-17 DIAGNOSIS — R262 Difficulty in walking, not elsewhere classified: Secondary | ICD-10-CM | POA: Diagnosis not present

## 2014-11-18 DIAGNOSIS — M6281 Muscle weakness (generalized): Secondary | ICD-10-CM | POA: Diagnosis not present

## 2014-11-18 DIAGNOSIS — R262 Difficulty in walking, not elsewhere classified: Secondary | ICD-10-CM | POA: Diagnosis not present

## 2014-11-18 DIAGNOSIS — R531 Weakness: Secondary | ICD-10-CM | POA: Diagnosis not present

## 2014-11-18 DIAGNOSIS — R2681 Unsteadiness on feet: Secondary | ICD-10-CM | POA: Diagnosis not present

## 2014-11-19 DIAGNOSIS — L609 Nail disorder, unspecified: Secondary | ICD-10-CM | POA: Diagnosis not present

## 2014-11-19 DIAGNOSIS — L03039 Cellulitis of unspecified toe: Secondary | ICD-10-CM | POA: Diagnosis not present

## 2014-11-19 DIAGNOSIS — R739 Hyperglycemia, unspecified: Secondary | ICD-10-CM | POA: Diagnosis not present

## 2014-11-19 DIAGNOSIS — R531 Weakness: Secondary | ICD-10-CM | POA: Diagnosis not present

## 2014-11-19 DIAGNOSIS — R262 Difficulty in walking, not elsewhere classified: Secondary | ICD-10-CM | POA: Diagnosis not present

## 2014-11-19 DIAGNOSIS — M6281 Muscle weakness (generalized): Secondary | ICD-10-CM | POA: Diagnosis not present

## 2014-11-19 DIAGNOSIS — R2681 Unsteadiness on feet: Secondary | ICD-10-CM | POA: Diagnosis not present

## 2014-11-22 DIAGNOSIS — M6281 Muscle weakness (generalized): Secondary | ICD-10-CM | POA: Diagnosis not present

## 2014-11-22 DIAGNOSIS — R262 Difficulty in walking, not elsewhere classified: Secondary | ICD-10-CM | POA: Diagnosis not present

## 2014-11-22 DIAGNOSIS — R531 Weakness: Secondary | ICD-10-CM | POA: Diagnosis not present

## 2014-11-22 DIAGNOSIS — R2681 Unsteadiness on feet: Secondary | ICD-10-CM | POA: Diagnosis not present

## 2014-11-23 DIAGNOSIS — R262 Difficulty in walking, not elsewhere classified: Secondary | ICD-10-CM | POA: Diagnosis not present

## 2014-11-23 DIAGNOSIS — R2681 Unsteadiness on feet: Secondary | ICD-10-CM | POA: Diagnosis not present

## 2014-11-23 DIAGNOSIS — M6281 Muscle weakness (generalized): Secondary | ICD-10-CM | POA: Diagnosis not present

## 2014-11-23 DIAGNOSIS — R531 Weakness: Secondary | ICD-10-CM | POA: Diagnosis not present

## 2014-11-24 DIAGNOSIS — R262 Difficulty in walking, not elsewhere classified: Secondary | ICD-10-CM | POA: Diagnosis not present

## 2014-11-24 DIAGNOSIS — R2681 Unsteadiness on feet: Secondary | ICD-10-CM | POA: Diagnosis not present

## 2014-11-24 DIAGNOSIS — R531 Weakness: Secondary | ICD-10-CM | POA: Diagnosis not present

## 2014-11-24 DIAGNOSIS — M6281 Muscle weakness (generalized): Secondary | ICD-10-CM | POA: Diagnosis not present

## 2014-11-25 DIAGNOSIS — R262 Difficulty in walking, not elsewhere classified: Secondary | ICD-10-CM | POA: Diagnosis not present

## 2014-11-25 DIAGNOSIS — R531 Weakness: Secondary | ICD-10-CM | POA: Diagnosis not present

## 2014-11-25 DIAGNOSIS — R2681 Unsteadiness on feet: Secondary | ICD-10-CM | POA: Diagnosis not present

## 2014-11-25 DIAGNOSIS — M6281 Muscle weakness (generalized): Secondary | ICD-10-CM | POA: Diagnosis not present

## 2014-11-26 DIAGNOSIS — M6281 Muscle weakness (generalized): Secondary | ICD-10-CM | POA: Diagnosis not present

## 2014-11-26 DIAGNOSIS — R531 Weakness: Secondary | ICD-10-CM | POA: Diagnosis not present

## 2014-11-26 DIAGNOSIS — R2681 Unsteadiness on feet: Secondary | ICD-10-CM | POA: Diagnosis not present

## 2014-11-26 DIAGNOSIS — R262 Difficulty in walking, not elsewhere classified: Secondary | ICD-10-CM | POA: Diagnosis not present

## 2014-11-29 DIAGNOSIS — R531 Weakness: Secondary | ICD-10-CM | POA: Diagnosis not present

## 2014-11-29 DIAGNOSIS — R262 Difficulty in walking, not elsewhere classified: Secondary | ICD-10-CM | POA: Diagnosis not present

## 2014-11-29 DIAGNOSIS — M6281 Muscle weakness (generalized): Secondary | ICD-10-CM | POA: Diagnosis not present

## 2014-11-29 DIAGNOSIS — R2681 Unsteadiness on feet: Secondary | ICD-10-CM | POA: Diagnosis not present

## 2014-11-30 DIAGNOSIS — L600 Ingrowing nail: Secondary | ICD-10-CM | POA: Diagnosis not present

## 2014-11-30 DIAGNOSIS — M79675 Pain in left toe(s): Secondary | ICD-10-CM | POA: Diagnosis not present

## 2014-11-30 DIAGNOSIS — L03032 Cellulitis of left toe: Secondary | ICD-10-CM | POA: Diagnosis not present

## 2014-12-01 DIAGNOSIS — L609 Nail disorder, unspecified: Secondary | ICD-10-CM | POA: Diagnosis not present

## 2014-12-01 DIAGNOSIS — J40 Bronchitis, not specified as acute or chronic: Secondary | ICD-10-CM | POA: Diagnosis not present

## 2014-12-01 DIAGNOSIS — R739 Hyperglycemia, unspecified: Secondary | ICD-10-CM | POA: Diagnosis not present

## 2014-12-01 DIAGNOSIS — E119 Type 2 diabetes mellitus without complications: Secondary | ICD-10-CM | POA: Diagnosis not present

## 2014-12-10 DIAGNOSIS — I2510 Atherosclerotic heart disease of native coronary artery without angina pectoris: Secondary | ICD-10-CM | POA: Diagnosis not present

## 2014-12-10 DIAGNOSIS — I10 Essential (primary) hypertension: Secondary | ICD-10-CM | POA: Diagnosis not present

## 2014-12-10 DIAGNOSIS — M25511 Pain in right shoulder: Secondary | ICD-10-CM | POA: Diagnosis not present

## 2014-12-10 DIAGNOSIS — Z8719 Personal history of other diseases of the digestive system: Secondary | ICD-10-CM | POA: Diagnosis not present

## 2014-12-10 DIAGNOSIS — G609 Hereditary and idiopathic neuropathy, unspecified: Secondary | ICD-10-CM | POA: Diagnosis not present

## 2014-12-10 DIAGNOSIS — E119 Type 2 diabetes mellitus without complications: Secondary | ICD-10-CM | POA: Diagnosis not present

## 2014-12-10 DIAGNOSIS — J45909 Unspecified asthma, uncomplicated: Secondary | ICD-10-CM | POA: Diagnosis not present

## 2014-12-10 DIAGNOSIS — K7460 Unspecified cirrhosis of liver: Secondary | ICD-10-CM | POA: Diagnosis not present

## 2014-12-14 DIAGNOSIS — M79675 Pain in left toe(s): Secondary | ICD-10-CM | POA: Diagnosis not present

## 2014-12-14 DIAGNOSIS — B351 Tinea unguium: Secondary | ICD-10-CM | POA: Diagnosis not present

## 2014-12-14 DIAGNOSIS — L03032 Cellulitis of left toe: Secondary | ICD-10-CM | POA: Diagnosis not present

## 2014-12-14 DIAGNOSIS — L600 Ingrowing nail: Secondary | ICD-10-CM | POA: Diagnosis not present

## 2014-12-15 ENCOUNTER — Emergency Department (HOSPITAL_COMMUNITY): Payer: Medicare Other

## 2014-12-15 ENCOUNTER — Encounter (HOSPITAL_COMMUNITY): Payer: Self-pay | Admitting: Emergency Medicine

## 2014-12-15 ENCOUNTER — Observation Stay (HOSPITAL_COMMUNITY)
Admission: EM | Admit: 2014-12-15 | Discharge: 2014-12-16 | Disposition: A | Discharge status: Home / Self Care | Payer: Medicare Other | Attending: Family Medicine | Admitting: Family Medicine

## 2014-12-15 DIAGNOSIS — M25511 Pain in right shoulder: Secondary | ICD-10-CM | POA: Diagnosis not present

## 2014-12-15 DIAGNOSIS — Z79899 Other long term (current) drug therapy: Secondary | ICD-10-CM | POA: Diagnosis not present

## 2014-12-15 DIAGNOSIS — R531 Weakness: Secondary | ICD-10-CM | POA: Diagnosis not present

## 2014-12-15 DIAGNOSIS — R410 Disorientation, unspecified: Secondary | ICD-10-CM | POA: Diagnosis not present

## 2014-12-15 DIAGNOSIS — Z86018 Personal history of other benign neoplasm: Secondary | ICD-10-CM | POA: Insufficient documentation

## 2014-12-15 DIAGNOSIS — Z9889 Other specified postprocedural states: Secondary | ICD-10-CM | POA: Insufficient documentation

## 2014-12-15 DIAGNOSIS — Z792 Long term (current) use of antibiotics: Secondary | ICD-10-CM | POA: Insufficient documentation

## 2014-12-15 DIAGNOSIS — K7290 Hepatic failure, unspecified without coma: Secondary | ICD-10-CM | POA: Diagnosis present

## 2014-12-15 DIAGNOSIS — E119 Type 2 diabetes mellitus without complications: Secondary | ICD-10-CM | POA: Diagnosis not present

## 2014-12-15 DIAGNOSIS — E785 Hyperlipidemia, unspecified: Secondary | ICD-10-CM | POA: Diagnosis not present

## 2014-12-15 DIAGNOSIS — G8929 Other chronic pain: Secondary | ICD-10-CM | POA: Insufficient documentation

## 2014-12-15 DIAGNOSIS — I2510 Atherosclerotic heart disease of native coronary artery without angina pectoris: Secondary | ICD-10-CM | POA: Diagnosis not present

## 2014-12-15 DIAGNOSIS — L03032 Cellulitis of left toe: Secondary | ICD-10-CM | POA: Diagnosis not present

## 2014-12-15 DIAGNOSIS — K219 Gastro-esophageal reflux disease without esophagitis: Secondary | ICD-10-CM | POA: Insufficient documentation

## 2014-12-15 DIAGNOSIS — K7460 Unspecified cirrhosis of liver: Secondary | ICD-10-CM | POA: Diagnosis not present

## 2014-12-15 DIAGNOSIS — I10 Essential (primary) hypertension: Secondary | ICD-10-CM | POA: Diagnosis not present

## 2014-12-15 DIAGNOSIS — G9349 Other encephalopathy: Secondary | ICD-10-CM | POA: Diagnosis present

## 2014-12-15 DIAGNOSIS — E7220 Disorder of urea cycle metabolism, unspecified: Secondary | ICD-10-CM | POA: Diagnosis present

## 2014-12-15 DIAGNOSIS — K208 Other esophagitis: Secondary | ICD-10-CM | POA: Diagnosis not present

## 2014-12-15 DIAGNOSIS — J45909 Unspecified asthma, uncomplicated: Secondary | ICD-10-CM | POA: Diagnosis not present

## 2014-12-15 DIAGNOSIS — Z794 Long term (current) use of insulin: Secondary | ICD-10-CM | POA: Insufficient documentation

## 2014-12-15 DIAGNOSIS — Z7982 Long term (current) use of aspirin: Secondary | ICD-10-CM | POA: Insufficient documentation

## 2014-12-15 DIAGNOSIS — Z8719 Personal history of other diseases of the digestive system: Secondary | ICD-10-CM | POA: Diagnosis not present

## 2014-12-15 DIAGNOSIS — K859 Acute pancreatitis, unspecified: Secondary | ICD-10-CM | POA: Insufficient documentation

## 2014-12-15 DIAGNOSIS — K2950 Unspecified chronic gastritis without bleeding: Secondary | ICD-10-CM | POA: Diagnosis not present

## 2014-12-15 DIAGNOSIS — D619 Aplastic anemia, unspecified: Secondary | ICD-10-CM | POA: Diagnosis not present

## 2014-12-15 DIAGNOSIS — K7682 Hepatic encephalopathy: Secondary | ICD-10-CM | POA: Diagnosis present

## 2014-12-15 DIAGNOSIS — D696 Thrombocytopenia, unspecified: Secondary | ICD-10-CM | POA: Diagnosis not present

## 2014-12-15 DIAGNOSIS — G609 Hereditary and idiopathic neuropathy, unspecified: Secondary | ICD-10-CM | POA: Diagnosis not present

## 2014-12-15 DIAGNOSIS — K317 Polyp of stomach and duodenum: Secondary | ICD-10-CM | POA: Insufficient documentation

## 2014-12-15 DIAGNOSIS — Z022 Encounter for examination for admission to residential institution: Secondary | ICD-10-CM

## 2014-12-15 DIAGNOSIS — K7581 Nonalcoholic steatohepatitis (NASH): Secondary | ICD-10-CM

## 2014-12-15 LAB — COMPREHENSIVE METABOLIC PANEL
ALBUMIN: 3.1 g/dL — AB (ref 3.5–5.2)
ALT: 15 U/L (ref 0–35)
AST: 20 U/L (ref 0–37)
Alkaline Phosphatase: 95 U/L (ref 39–117)
Anion gap: 6 (ref 5–15)
BUN: 21 mg/dL (ref 6–23)
CO2: 24 mmol/L (ref 19–32)
Calcium: 8.5 mg/dL (ref 8.4–10.5)
Chloride: 105 mmol/L (ref 96–112)
Creatinine, Ser: 0.85 mg/dL (ref 0.50–1.10)
GFR calc Af Amer: 78 mL/min — ABNORMAL LOW (ref >90)
GFR calc non Af Amer: 67 mL/min — ABNORMAL LOW (ref >90)
Glucose, Bld: 256 mg/dL — ABNORMAL HIGH (ref 70–99)
POTASSIUM: 4.2 mmol/L (ref 3.5–5.1)
SODIUM: 135 mmol/L (ref 135–145)
Total Bilirubin: 1.4 mg/dL — ABNORMAL HIGH (ref 0.3–1.2)
Total Protein: 6.1 g/dL (ref 6.0–8.3)

## 2014-12-15 LAB — PROTIME-INR
INR: 1.13 (ref 0.00–1.49)
PROTHROMBIN TIME: 14.6 s (ref 11.6–15.2)

## 2014-12-15 LAB — URINALYSIS, ROUTINE W REFLEX MICROSCOPIC
BILIRUBIN URINE: NEGATIVE
Glucose, UA: NEGATIVE mg/dL
HGB URINE DIPSTICK: NEGATIVE
KETONES UR: NEGATIVE mg/dL
LEUKOCYTES UA: NEGATIVE
Nitrite: NEGATIVE
Protein, ur: NEGATIVE mg/dL
Specific Gravity, Urine: >1.03 — ABNORMAL HIGH (ref 1.005–1.030)
Urobilinogen, UA: 0.2 mg/dL (ref 0.0–1.0)
pH: 5.5 (ref 5.0–8.0)

## 2014-12-15 LAB — CBC WITH DIFFERENTIAL/PLATELET
BASOS ABS: 0 10*3/uL (ref 0.0–0.1)
BASOS PCT: 1 % (ref 0–1)
EOS ABS: 0.1 10*3/uL (ref 0.0–0.7)
Eosinophils Relative: 2 % (ref 0–5)
HEMATOCRIT: 36.5 % (ref 36.0–46.0)
Hemoglobin: 12 g/dL (ref 12.0–15.0)
LYMPHS ABS: 1.3 10*3/uL (ref 0.7–4.0)
LYMPHS PCT: 32 % (ref 12–46)
MCH: 26.9 pg (ref 26.0–34.0)
MCHC: 32.9 g/dL (ref 30.0–36.0)
MCV: 81.8 fL (ref 78.0–100.0)
MONO ABS: 0.3 10*3/uL (ref 0.1–1.0)
Monocytes Relative: 8 % (ref 3–12)
NEUTROS PCT: 57 % (ref 43–77)
Neutro Abs: 2.3 10*3/uL (ref 1.7–7.7)
Platelets: 138 10*3/uL — ABNORMAL LOW (ref 150–400)
RBC: 4.46 MIL/uL (ref 3.87–5.11)
RDW: 15 % (ref 11.5–15.5)
WBC: 4.1 10*3/uL (ref 4.0–10.5)

## 2014-12-15 LAB — I-STAT CG4 LACTIC ACID, ED: LACTIC ACID, VENOUS: 1.59 mmol/L (ref 0.5–2.0)

## 2014-12-15 LAB — ETHANOL: Alcohol, Ethyl (B): <5 mg/dL (ref 0–9)

## 2014-12-15 LAB — AMMONIA: Ammonia: 84 umol/L — ABNORMAL HIGH (ref 11–32)

## 2014-12-15 LAB — LIPASE, BLOOD: Lipase: 23 U/L (ref 11–59)

## 2014-12-15 MED ORDER — RIFAXIMIN 550 MG PO TABS
ORAL_TABLET | ORAL | Status: AC
  Filled 2014-12-15: qty 1

## 2014-12-15 MED ORDER — RIFAXIMIN 550 MG PO TABS
550.0000 mg | ORAL_TABLET | Freq: Once | ORAL | Status: AC
  Administered 2014-12-15: 550 mg via ORAL
  Filled 2014-12-15: qty 1

## 2014-12-15 NOTE — ED Provider Notes (Signed)
CSN: [id number]     Arrival date & time 12/15/14  1725 History  This chart was scribed for Carmin Muskrat, MD by Edison Simon, ED Scribe. This patient was seen in room APA12/APA12 and the patient's care was started at 6:27 PM.    Chief Complaint  Patient presents with  . Weakness   The history is provided by the patient and the spouse. No language interpreter was used.    HPI Comments: [name] [name] is a 74 y.o. female who presents to the Emergency Department complaining of generalized weakness and frequent falls with onset 1 week ago. She states she was "fair" previously. Husband notes she was in a nursing home until 2 weeks ago but was sent home for monetary reasons. She states she was doing well the first week back. Husband states she normally cannot walk. Husband reports associated cough and fatigue. He states she has history of liver problems, elevated ammonia, UTIs, and gallbladder problems but is not a good candidate for surgery. He states she has therapy at home (2 sessions now) but does not have any new medications since discharge from nursing home. She states her right arm is "broken" from falling against a wall 1 year ago She denies any new pain, chest pain, headache, abdominal pain, fever, or vomiting.  Past Medical History  Diagnosis Date  . CAD in native artery   . Thrombocytopenia, unspecified   . Unspecified hereditary and idiopathic peripheral neuropathy   . Unspecified fall   . Other and unspecified hyperlipidemia   . Unspecified essential hypertension   . Obesity, unspecified   . Personal history of neurosis   . Intrinsic asthma, unspecified   . Allergic rhinitis, cause unspecified   . Cirrhosis of liver     NASH, afp on 06/17/12 =3.7, per pt she had hep B vaccines in 1996, hep A in process.   . Colon adenomas 03/08/10    tcs by Dr. Gala Romney  . Hyperplastic polyps of stomach 03/08/10    tcs by Dr. Dudley Major  . Chronic gastritis 03/09/11    egd by Dr. Gala Romney  . History of  hemorrhoids 03/08/10    tcs- internal and external  . Diverticula of colon 03/08/10    L side  . Esophagitis, erosive 03/08/10  . GERD (gastroesophageal reflux disease)   . Wears glasses   . Type II or unspecified type diabetes mellitus without mention of complication, not stated as uncontrolled   . Vertigo   . Pancreatitis   . Bilateral leg weakness   . Thrombocytopenia 04/12/2014  . Pancytopenia, acquired 04/12/2014  . Cirrhosis   . Abnormal EKG     hx of left anterior fasicular block on 04-09-13 ekg epic  . Chronic shoulder pain    Past Surgical History  Procedure Laterality Date  . Hysterectomy and btl      s/p  . Tumor excision  2003    rt arm and left foot  . Colonoscopy  03/08/10    Dr. [name]-->ext/int hemorrhoids, anal paipilla, rectal polyps, desc polyps, cecal polyp, left-sided diverticula. suboptiomal prep. next TCS 02/2015. multiple adenomas  . Esophagogastroduodenoscopy  03/08/10    Dr. [name] erosive RE, small hh, antral erosions, small 1cm are of mucosal indentation along gastric body of doubtful significance, cystic nodularity of hypophyarynx, base of tongue, base of epiglottis. benign gastric biopsies  . Tubal ligation    . Esophagogastroduodenoscopy  10/08/2011    Dr. [name] esophageal varices, antral erosion. next egd 09/2013  . Foot  surgery  2003    lt foot  . Eye surgery      cataracts bilateral  . Orif humerus fracture Right 02/17/2013    Procedure: OPEN REDUCTION INTERNAL FIXATION (ORIF) RIGHT PROXIMAL HUMERUS FRACTURE;  Surgeon: [name], MD;  Location: [place];  Service: Orthopedics;  Laterality: Right;  . Partial gastrectomy      family denies  . Esophagogastroduodenoscopy N/A 02/17/2014    Dr. Gala Romney: portal gastropathy, no varices. Due for surveillance 2017  . Breast surgery Right few yrs ago    benign area removed  . Abdominal hysterectomy    . Cardiac catheterization  02/25/2007    Dr. [name]:  normal LV systolic function, mild irregularities of  LAD   Family History  Problem Relation Age of Onset  . Coronary artery disease      FH  . Diabetes      FH  . Heart disease Mother   . Colon cancer Neg Hx    History  Substance Use Topics  . Smoking status: Never Smoker   . Smokeless tobacco: Never Used  . Alcohol Use: No   OB History    No data available     Review of Systems  Constitutional: Positive for fatigue. Negative for fever.       Per HPI, otherwise negative  HENT:       Per HPI, otherwise negative  Respiratory:       Per HPI, otherwise negative  Cardiovascular: Negative for chest pain.       Per HPI, otherwise negative  Gastrointestinal: Negative for vomiting and abdominal pain.  Endocrine:       Negative aside from HPI  Genitourinary:       Neg aside from HPI   Musculoskeletal:       Per HPI, otherwise negative  Skin: Negative.   Neurological: Positive for weakness. Negative for syncope and headaches.      Allergies  Morphine and related and Compazine  Home Medications   Prior to Admission medications   Medication Sig Start Date End Date Taking? Authorizing Provider  ALPRAZolam Duanne Moron) 0.5 MG tablet Take 1 tablet (0.5 mg total) by mouth 3 (three) times daily as needed for anxiety. 08/19/14   Erline Hau, MD  ALPRAZolam Duanne Moron) 0.5 MG tablet Take 1 tablet (0.5 mg total) by mouth 3 (three) times daily as needed for anxiety. 10/20/14   Francine Graven, DO  aspirin EC 325 MG EC tablet Take 1 tablet (325 mg total) by mouth every morning. 07/14/14   Nita Sells, MD  budesonide-formoterol (SYMBICORT) 160-4.5 MCG/ACT inhaler Inhale 2 puffs into the lungs at bedtime.     Historical Provider, MD  ciprofloxacin (CIPRO) 500 MG tablet Take 1 tablet (500 mg total) by mouth 2 (two) times daily. Patient not taking: Reported on 10/15/2014 08/19/14   Erline Hau, MD  enalapril (VASOTEC) 5 MG tablet Take 1 tablet by mouth daily. 08/25/14   Historical Provider, MD  ESTRACE VAGINAL 0.1  MG/GM vaginal cream Place 1 Applicatorful vaginally every other day. At  bedtime 08/11/13   Historical Provider, MD  furosemide (LASIX) 20 MG tablet Take 1 tablet (20 mg total) by mouth every morning. 09/03/14   Mahala Menghini, PA-C  insulin aspart (NOVOLOG FLEXPEN) 100 UNIT/ML FlexPen Inject 2-10 Units into the skin 3 (three) times daily with meals. Sliding Scale: 200-250= 2 units 251-300= 4 units 301-350= 6 units 351-400= 8 units If levels are over 400, GIVE 10 UNITS  AND CALL MD    Historical Provider, MD  insulin detemir (LEVEMIR) 100 UNIT/ML injection Inject 60 Units into the skin at bedtime.     Historical Provider, MD  lactulose, encephalopathy, (GENERLAC) 10 GM/15ML SOLN Take 45 mLs (30 g total) by mouth 3 (three) times daily. 06/28/14   Kathie Dike, MD  meclizine (ANTIVERT) 25 MG tablet Take 25 mg by mouth 3 (three) times daily as needed for dizziness or nausea.     Historical Provider, MD  Multiple Vitamins-Minerals (CENTRUM SILVER PO) Take 1 tablet by mouth daily.     Historical Provider, MD  NEXIUM 40 MG capsule Take 40 mg by mouth every morning.  06/17/14   Historical Provider, MD  ondansetron (ZOFRAN) 4 MG tablet Take 1 tablet (4 mg total) by mouth every 6 (six) hours as needed for nausea. 08/06/14   Radene Gunning, NP  OxyCODONE (OXYCONTIN) 10 mg T12A 12 hr tablet Take 1 tablet (10 mg total) by mouth 2 (two) times daily as needed (severe pain). 08/19/14   Erline Hau, MD  OxyCODONE (OXYCONTIN) 10 mg T12A 12 hr tablet Take 1 tablet (10 mg total) by mouth every 12 (twelve) hours as needed (severe pain). 10/20/14   Francine Graven, DO  pregabalin (LYRICA) 100 MG capsule Take 100 mg by mouth 2 (two) times daily.    Historical Provider, MD  quinapril (ACCUPRIL) 5 MG tablet Take 5 mg by mouth at bedtime.    Historical Provider, MD  rifaximin (XIFAXAN) 550 MG TABS tablet Take 1 tablet (550 mg total) by mouth 2 (two) times daily. Patient not taking: Reported on 10/15/2014 06/28/14    Kathie Dike, MD  spironolactone (ALDACTONE) 50 MG tablet Take 1 tablet (50 mg total) by mouth every morning. 10/19/14   Mahala Menghini, PA-C  triamcinolone cream (KENALOG) 0.1 % Apply 1 application topically daily as needed (for irritation).  06/26/13   Historical Provider, MD  ursodiol (URSO FORTE) 500 MG tablet Take 500 mg by mouth 2 (two) times daily.    Historical Provider, MD   BP 126/55 mmHg  Pulse 70  Temp(Src) 98.7 F (37.1 C) (Oral)  Resp 18  Ht 5\' 8"  (1.727 m)  Wt 230 lb (104.327 kg)  BMI 34.98 kg/m2  SpO2 98% Physical Exam  Constitutional: She is oriented to person, place, and time. She appears well-developed and well-nourished. No distress.  HENT:  Head: Normocephalic and atraumatic.  Eyes: Conjunctivae and EOM are normal.  Cardiovascular: Normal rate and regular rhythm.   Pulmonary/Chest: Effort normal and breath sounds normal. No stridor. No respiratory distress.  Abdominal: She exhibits no distension.  Musculoskeletal: She exhibits no edema.  Grip strength symmetric Unable to lift arms due to atrophy and pain in right arm from prior problem Bilateral lower extremity edema  Neurological: She is alert and oriented to person, place, and time. No cranial nerve deficit.  Skin: Skin is warm and dry.  Psychiatric: She has a normal mood and affect.  Nursing note and vitals reviewed.   ED Course  Procedures   COORDINATION OF CARE: 6:37 PM Discussed treatment plan with patient at beside, the patient agrees with the plan and has no further questions at this time.   Labs Review Labs Reviewed  COMPREHENSIVE METABOLIC PANEL - Abnormal; Notable for the following:    Glucose, Bld 256 (*)    Albumin 3.1 (*)    Total Bilirubin 1.4 (*)    GFR calc non Af Amer 67 (*)  GFR calc Af Amer 78 (*)    All other components within normal limits  CBC WITH DIFFERENTIAL/PLATELET - Abnormal; Notable for the following:    Platelets 138 (*)    All other components within normal  limits  URINALYSIS, ROUTINE W REFLEX MICROSCOPIC - Abnormal; Notable for the following:    Specific Gravity, Urine >1.030 (*)    All other components within normal limits  AMMONIA - Abnormal; Notable for the following:    Ammonia 84 (*)    All other components within normal limits  ETHANOL  LIPASE, BLOOD  PROTIME-INR  I-STAT CG4 LACTIC ACID, ED    Imaging Review Dg Chest 2 View  12/15/2014   CLINICAL DATA:  Acute onset of weakness and confusion for 2 days. Initial encounter.  EXAM: CHEST  2 VIEW  COMPARISON:  Chest radiograph performed 10/20/2014  FINDINGS: The lungs are well-aerated. Chronically increased interstitial markings are seen. There is no evidence of focal opacification, pleural effusion or pneumothorax.  The heart is borderline enlarged. No acute osseous abnormalities are seen.  IMPRESSION: Mild chronic lung changes noted. Borderline cardiomegaly. No focal airspace consolidation seen.   Electronically Signed   By: Garald Balding M.D.   On: 12/15/2014 21:27    A review of the patient's chart demonstrates that this episode is similar to 1 2 months ago requiring nursing home placement.  On repeat exam the patient is calm.  With persistent hyperammonemia she received additional dose of rifaximin.  Patient's husband states that he cannot take care of the patient home, she is not capable of functioning at home independently either. They request nursing home placement, assistance.   MDM    I personally performed the services described in this documentation, which was scribed in my presence. The recorded information has been reviewed and is accurate.   Patient with cirrhosis, multiple other medical issues presents with new weakness, confusion. Patient is found to have persistent hyperammonemia in spite of taking multiple medication for this. Patient's vital signs are stable, but given her weakness, inability to function at home, lack of safety at home, I discussed her case with our  hospitalist colleagues for observation admission with next day case management/social work evaluation for assistance with placement.  Carmin Muskrat, MD 12/15/14 [phone]

## 2014-12-15 NOTE — ED Notes (Signed)
Patient complaining of weakness x 2 days. Family member states "I think her ammonia level is up. She's been a little confused at times." Patient denies pain at this time.

## 2014-12-15 NOTE — ED Notes (Signed)
Patient states that she is not having any pain. Patient states that she wants something to eat. Explained that she still has more test to have done.

## 2014-12-16 DIAGNOSIS — G9349 Other encephalopathy: Secondary | ICD-10-CM

## 2014-12-16 DIAGNOSIS — D696 Thrombocytopenia, unspecified: Secondary | ICD-10-CM | POA: Diagnosis not present

## 2014-12-16 DIAGNOSIS — E7220 Disorder of urea cycle metabolism, unspecified: Secondary | ICD-10-CM | POA: Diagnosis present

## 2014-12-16 DIAGNOSIS — I10 Essential (primary) hypertension: Secondary | ICD-10-CM

## 2014-12-16 DIAGNOSIS — R531 Weakness: Secondary | ICD-10-CM

## 2014-12-16 DIAGNOSIS — K7290 Hepatic failure, unspecified without coma: Secondary | ICD-10-CM | POA: Diagnosis not present

## 2014-12-16 DIAGNOSIS — Z0289 Encounter for other administrative examinations: Secondary | ICD-10-CM

## 2014-12-16 DIAGNOSIS — Z022 Encounter for examination for admission to residential institution: Secondary | ICD-10-CM

## 2014-12-16 DIAGNOSIS — K7581 Nonalcoholic steatohepatitis (NASH): Secondary | ICD-10-CM

## 2014-12-16 LAB — CBC
HCT: 37.7 % (ref 36.0–46.0)
Hemoglobin: 12.4 g/dL (ref 12.0–15.0)
MCH: 26.8 pg (ref 26.0–34.0)
MCHC: 32.9 g/dL (ref 30.0–36.0)
MCV: 81.4 fL (ref 78.0–100.0)
PLATELETS: 121 10*3/uL — AB (ref 150–400)
RBC: 4.63 MIL/uL (ref 3.87–5.11)
RDW: 15.1 % (ref 11.5–15.5)
WBC: 7.5 10*3/uL (ref 4.0–10.5)

## 2014-12-16 LAB — BASIC METABOLIC PANEL
Anion gap: 10 (ref 5–15)
BUN: 22 mg/dL (ref 6–23)
CALCIUM: 8.8 mg/dL (ref 8.4–10.5)
CHLORIDE: 103 mmol/L (ref 96–112)
CO2: 25 mmol/L (ref 19–32)
Creatinine, Ser: 0.79 mg/dL (ref 0.50–1.10)
GFR calc Af Amer: >90 mL/min (ref >90)
GFR calc non Af Amer: 82 mL/min — ABNORMAL LOW (ref >90)
GLUCOSE: 240 mg/dL — AB (ref 70–99)
Potassium: 4.2 mmol/L (ref 3.5–5.1)
SODIUM: 138 mmol/L (ref 135–145)

## 2014-12-16 LAB — MRSA PCR SCREENING: MRSA by PCR: POSITIVE — AB

## 2014-12-16 LAB — GLUCOSE, CAPILLARY: GLUCOSE-CAPILLARY: 259 mg/dL — AB (ref 70–99)

## 2014-12-16 MED ORDER — SODIUM CHLORIDE 0.9 % IV SOLN: 250.0000 mL | INTRAVENOUS | Status: DC | PRN

## 2014-12-16 MED ORDER — INSULIN DETEMIR 100 UNIT/ML ~~LOC~~ SOLN
20.0000 [IU] | Freq: Every day | SUBCUTANEOUS | Status: DC
  Administered 2014-12-16: 20 [IU] via SUBCUTANEOUS
  Filled 2014-12-16 (×2): qty 0.2

## 2014-12-16 MED ORDER — INSULIN ASPART 100 UNIT/ML ~~LOC~~ SOLN
0.0000 [IU] | Freq: Three times a day (TID) | SUBCUTANEOUS | Status: DC
Start: 2014-12-16 — End: 2014-12-16
  Administered 2014-12-16: 8 [IU] via SUBCUTANEOUS

## 2014-12-16 MED ORDER — CHLORHEXIDINE GLUCONATE CLOTH 2 % EX PADS
6.0000 | MEDICATED_PAD | Freq: Every day | CUTANEOUS | Status: DC
  Administered 2014-12-16: 6 via TOPICAL

## 2014-12-16 MED ORDER — INSULIN ASPART 100 UNIT/ML ~~LOC~~ SOLN: 0.0000 [IU] | Freq: Three times a day (TID) | SUBCUTANEOUS | Status: DC

## 2014-12-16 MED ORDER — INSULIN ASPART 100 UNIT/ML ~~LOC~~ SOLN: 0.0000 [IU] | Freq: Every day | SUBCUTANEOUS | Status: DC

## 2014-12-16 MED ORDER — LACTULOSE 10 GM/15ML PO SOLN
30.0000 g | Freq: Three times a day (TID) | ORAL | Status: DC
  Administered 2014-12-16 (×3): 30 g via ORAL
  Filled 2014-12-16 (×4): qty 60

## 2014-12-16 MED ORDER — RIFAXIMIN 550 MG PO TABS
550.0000 mg | ORAL_TABLET | Freq: Two times a day (BID) | ORAL | Status: DC
  Administered 2014-12-16 (×2): 550 mg via ORAL
  Filled 2014-12-16 (×2): qty 1

## 2014-12-16 MED ORDER — SODIUM CHLORIDE 0.9 % IJ SOLN
3.0000 mL | Freq: Two times a day (BID) | INTRAMUSCULAR | Status: DC
  Administered 2014-12-16: 3 mL via INTRAVENOUS

## 2014-12-16 MED ORDER — MUPIROCIN 2 % EX OINT: 1.0000 "application " | TOPICAL_OINTMENT | Freq: Two times a day (BID) | CUTANEOUS | Status: DC

## 2014-12-16 MED ORDER — SODIUM CHLORIDE 0.9 % IJ SOLN: 3.0000 mL | INTRAMUSCULAR | Status: DC | PRN

## 2014-12-16 MED ORDER — SODIUM CHLORIDE 0.9 % IV SOLN
INTRAVENOUS | Status: DC
Start: 2014-12-16 — End: 2014-12-16
  Administered 2014-12-16: 01:00:00 via INTRAVENOUS

## 2014-12-16 MED ORDER — OXYCODONE HCL ER 10 MG PO T12A
10.0000 mg | EXTENDED_RELEASE_TABLET | Freq: Two times a day (BID) | ORAL | Status: DC
  Administered 2014-12-16 (×2): 10 mg via ORAL
  Filled 2014-12-16 (×2): qty 1

## 2014-12-16 NOTE — Progress Notes (Signed)
Inpatient Diabetes Program Recommendations  AACE/ADA: New Consensus Statement on Inpatient Glycemic Control (2013)  Target Ranges:  Prepandial:   less than 140 mg/dL      Peak postprandial:   less than 180 mg/dL (1-2 hours)      Critically ill patients:  140 - 180 mg/dL   Results for [name], [name] [name] (MRN [id number]) as of 12/16/2014 09:01  Ref. Range 12/15/2014 20:12 12/16/2014 05:32  Glucose Latest Range: 70-99 mg/dL 256 (H) 240 (H)   Diabetes history: DM2 Outpatient Diabetes medications: Levemir 60 units QHS, Novolog 0-10 units TID with meals Current orders for Inpatient glycemic control: None  Inpatient Diabetes Program Recommendations Insulin - Basal: Please consider ordering basal insulin; recommend starting with at least Levemir 20 units daily (to start now). Correction (SSI): Please order CBGs with Novolog moderate correction scale ACHS.  Thanks, Barnie Alderman, RN, MSN, CCRN, CDE Diabetes Coordinator Inpatient Diabetes Program  [phone] (Team Pager) [phone] (AP office) [phone] [facility] office)

## 2014-12-16 NOTE — Discharge Summary (Signed)
Physician Discharge Summary  [name] [name] DSK:[id number] DOB: [birth year] DOA: 12/15/2014  PCP: Purvis Kilts, MD  Admit date: 12/15/2014 Discharge date: 12/16/2014  Time spent: 40 minutes  Recommendations for Outpatient Follow-up:  1. Follow up with PCP 2 weeks for evaluation of functional status. Recommend review of medications.  2. Home health RN PT  Discharge Diagnoses:  Principal Problem:   Encounter for examination for admission to nursing home Active Problems:   Hepatic cirrhosis   Essential hypertension   Encephalopathy chronic   Liver cirrhosis secondary to NASH   Thrombocytopenia   Hepatic encephalopathy   Morbid obesity   Well controlled type 2 diabetes mellitus   Weakness   Hyperammonemia   Discharge Condition: stable  Diet recommendation: carb modified heart healthy  Filed Weights   12/15/14 1815 12/16/14 0137  Weight: 104.327 kg (230 lb) 105.235 kg (232 lb)    History of present illness:  74 yo female nash cirrhosis of the livr, dm, htn, cad brought in to ED on 12/16/14  by her husband because he cannot take care of her at home. She was just released from the nursing home, pt reported jan 19. She ambulates with a walker. was taking her medicine. She did not know why she was released from the nursing home. She denied any n/v/d. No fevers. No cough. No swelling. She was not oriented to time. Appeared nontoxic and nonill appearing at this time.  Hospital Course:  Encounter for examination for admission to nursing home-husband reports inability to care for patient and requests placement on presentation. Presented with bed offers and decided to take patient home with [facility] instead of placing. See SW note. Patient is at baseline medically. She denies pain/discomfort and requests discharge.    Active Problems:   Hepatic cirrhosis: ammonia level elevated but appears close to baseline.    Essential hypertension: controlled   Encephalopathy chronic:  related to cirrhosis.  continue lactulose. Would recommend evaluation of medications    Liver cirrhosis secondary to NASH: stable    Weakness: appears stable at baseline. HH PT arranged  Procedures:  none  Consultations:  none  Discharge Exam: Filed Vitals:   12/16/14 0601  BP: 126/72  Pulse: 98  Temp: 99.5 F (37.5 C)  Resp: 20    General: obese appears comfortable Cardiovascular: RRR No MGR trace LE edema Respiratory: normal effort BS clear bilaterally no wheeze no rhonchi  Discharge Instructions   Discharge Instructions    Diet - low sodium heart healthy    Complete by:  As directed      Discharge instructions    Complete by:  As directed   Take medications as directed     Increase activity slowly    Complete by:  As directed           Current Discharge Medication List    CONTINUE these medications which have NOT CHANGED   Details  ALPRAZolam (XANAX) 0.5 MG tablet Take 1 tablet (0.5 mg total) by mouth 3 (three) times daily as needed for anxiety. Qty: 30 tablet, Refills: 0    aspirin EC 325 MG EC tablet Take 1 tablet (325 mg total) by mouth every morning. Qty: 30 tablet, Refills: 0    budesonide-formoterol (SYMBICORT) 160-4.5 MCG/ACT inhaler Inhale 2 puffs into the lungs at bedtime.     enalapril (VASOTEC) 5 MG tablet Take 1 tablet by mouth daily.    ESTRACE VAGINAL 0.1 MG/GM vaginal cream Place 1 Applicatorful vaginally every other day. At  bedtime    furosemide (LASIX) 20 MG tablet Take 1 tablet (20 mg total) by mouth every morning. Qty: 30 tablet, Refills: 5    insulin aspart (NOVOLOG FLEXPEN) 100 UNIT/ML FlexPen Inject 2-10 Units into the skin 3 (three) times daily with meals. Sliding Scale: 200-250= 2 units 251-300= 4 units 301-350= 6 units 351-400= 8 units If levels are over 400, GIVE 10 UNITS AND CALL MD    insulin detemir (LEVEMIR) 100 UNIT/ML injection Inject 60 Units into the skin at bedtime.     lactulose, encephalopathy,  (GENERLAC) 10 GM/15ML SOLN Take 45 mLs (30 g total) by mouth 3 (three) times daily. Qty: 2700 mL, Refills: 0    meclizine (ANTIVERT) 25 MG tablet Take 25 mg by mouth 3 (three) times daily as needed for dizziness or nausea.     Multiple Vitamins-Minerals (CENTRUM SILVER PO) Take 1 tablet by mouth daily.     NEXIUM 40 MG capsule Take 40 mg by mouth every morning.     ondansetron (ZOFRAN) 4 MG tablet Take 1 tablet (4 mg total) by mouth every 6 (six) hours as needed for nausea. Qty: 20 tablet, Refills: 0    OxyCODONE (OXYCONTIN) 10 mg T12A 12 hr tablet Take 1 tablet (10 mg total) by mouth 2 (two) times daily as needed (severe pain). Qty: 30 tablet, Refills: 0    pregabalin (LYRICA) 100 MG capsule Take 100 mg by mouth 2 (two) times daily.    quinapril (ACCUPRIL) 5 MG tablet Take 5 mg by mouth at bedtime.    rifaximin (XIFAXAN) 550 MG TABS tablet Take 1 tablet (550 mg total) by mouth 2 (two) times daily. Qty: 60 tablet, Refills: 1    spironolactone (ALDACTONE) 50 MG tablet Take 1 tablet (50 mg total) by mouth every morning. Qty: 30 tablet, Refills: 5    triamcinolone cream (KENALOG) 0.1 % Apply 1 application topically daily as needed (for irritation).     ursodiol (URSO FORTE) 500 MG tablet Take 500 mg by mouth 2 (two) times daily.      STOP taking these medications     ciprofloxacin (CIPRO) 500 MG tablet        Allergies  Allergen Reactions  . Morphine And Related Itching  . Compazine [Prochlorperazine Edisylate] Anxiety    Causes anxiety & nervousness   Follow-up Information    Follow up with Purvis Kilts, MD.   Specialty:  Family Medicine   Contact information:   [address] [place] [address] [phone]        The results of significant diagnostics from this hospitalization (including imaging, microbiology, ancillary and laboratory) are listed below for reference.    Significant Diagnostic Studies: Dg Chest 2 View  12/15/2014   CLINICAL DATA:   Acute onset of weakness and confusion for 2 days. Initial encounter.  EXAM: CHEST  2 VIEW  COMPARISON:  Chest radiograph performed 10/20/2014  FINDINGS: The lungs are well-aerated. Chronically increased interstitial markings are seen. There is no evidence of focal opacification, pleural effusion or pneumothorax.  The heart is borderline enlarged. No acute osseous abnormalities are seen.  IMPRESSION: Mild chronic lung changes noted. Borderline cardiomegaly. No focal airspace consolidation seen.   Electronically Signed   By: Garald Balding M.D.   On: 12/15/2014 21:27    Microbiology: Recent Results (from the past 240 hour(s))  MRSA PCR Screening     Status: Abnormal   Collection Time: 12/16/14  7:15 AM  Result Value Ref Range Status   MRSA  by PCR POSITIVE (A) NEGATIVE Final    Comment:        The GeneXpert MRSA Assay (FDA approved for NASAL specimens only), is one component of a comprehensive MRSA colonization surveillance program. It is not intended to diagnose MRSA infection nor to guide or monitor treatment for MRSA infections. RESULT CALLED TO, READ BACK BY AND VERIFIED WITH: TANDON,R. AT 1143 ON 12/16/2014 BY BAUGHAM,M.      Labs: Basic Metabolic Panel:  Recent Labs Lab 12/15/14 2012 12/16/14 0532  NA 135 138  K 4.2 4.2  CL 105 103  CO2 24 25  GLUCOSE 256* 240*  BUN 21 22  CREATININE 0.85 0.79  CALCIUM 8.5 8.8   Liver Function Tests:  Recent Labs Lab 12/15/14 2012  AST 20  ALT 15  ALKPHOS 95  BILITOT 1.4*  PROT 6.1  ALBUMIN 3.1*    Recent Labs Lab 12/15/14 2012  LIPASE 23    Recent Labs Lab 12/15/14 1945  AMMONIA 84*   CBC:  Recent Labs Lab 12/15/14 2012 12/16/14 0532  WBC 4.1 7.5  NEUTROABS 2.3  --   HGB 12.0 12.4  HCT 36.5 37.7  MCV 81.8 81.4  PLT 138* 121*   Cardiac Enzymes: No results for input(s): CKTOTAL, CKMB, CKMBINDEX, TROPONINI in the last 168 hours. BNP: BNP (last 3 results) No results for input(s): BNP in the last 8760  hours.  ProBNP (last 3 results) No results for input(s): PROBNP in the last 8760 hours.  CBG:  Recent Labs Lab 12/16/14 1129  GLUCAP 259*       Signed:  Radene Gunning  [facility] 12/16/2014, 12:13 PM

## 2014-12-16 NOTE — Progress Notes (Signed)
UR completed 

## 2014-12-16 NOTE — Progress Notes (Signed)
Upon arrival on floor, pt stated that she was hungry.  A TV dinner was prepared for her but when it was given to her she said she could not eat it because she had become so nauseated from having severe abd pain.

## 2014-12-16 NOTE — H&P (Signed)
PCP:   Purvis Kilts, MD   Chief Complaint:  Needs placement  HPI: 74 yo female nash cirrhosis of the livr, dm, htn, cad brought in by her husband because he cannot take care of her at home.  She was just released from the nursing home, pt reports jan 19.  She ambulates with a walker.  Is taking her medicine.  She does not know why she was released from the nursing home.  She denies any n/v/d.  No fevers.  No cough. No swelling.  Husband has left.  Pt has chronic encephalopathy but unknown her baseline.  She is not oriented to time.  Appears nontoxic and nonill appearing at this time.  Review of Systems:  Positive and negative as per HPI otherwise all other systems are negative but unreliable Past Medical History: Past Medical History  Diagnosis Date  . CAD in native artery   . Thrombocytopenia, unspecified   . Unspecified hereditary and idiopathic peripheral neuropathy   . Unspecified fall   . Other and unspecified hyperlipidemia   . Unspecified essential hypertension   . Obesity, unspecified   . Personal history of neurosis   . Intrinsic asthma, unspecified   . Allergic rhinitis, cause unspecified   . Cirrhosis of liver     NASH, afp on 06/17/12 =3.7, per pt she had hep B vaccines in 1996, hep A in process.   . Colon adenomas 03/08/10    tcs by Dr. Gala Romney  . Hyperplastic polyps of stomach 03/08/10    tcs by Dr. Dudley Major  . Chronic gastritis 03/09/11    egd by Dr. Gala Romney  . History of hemorrhoids 03/08/10    tcs- internal and external  . Diverticula of colon 03/08/10    L side  . Esophagitis, erosive 03/08/10  . GERD (gastroesophageal reflux disease)   . Wears glasses   . Type II or unspecified type diabetes mellitus without mention of complication, not stated as uncontrolled   . Vertigo   . Pancreatitis   . Bilateral leg weakness   . Thrombocytopenia 04/12/2014  . Pancytopenia, acquired 04/12/2014  . Cirrhosis   . Abnormal EKG     hx of left anterior fasicular block on 04-09-13  ekg epic  . Chronic shoulder pain    Past Surgical History  Procedure Laterality Date  . Hysterectomy and btl      s/p  . Tumor excision  2003    rt arm and left foot  . Colonoscopy  03/08/10    Dr. [name]-->ext/int hemorrhoids, anal paipilla, rectal polyps, desc polyps, cecal polyp, left-sided diverticula. suboptiomal prep. next TCS 02/2015. multiple adenomas  . Esophagogastroduodenoscopy  03/08/10    Dr. [name] erosive RE, small hh, antral erosions, small 1cm are of mucosal indentation along gastric body of doubtful significance, cystic nodularity of hypophyarynx, base of tongue, base of epiglottis. benign gastric biopsies  . Tubal ligation    . Esophagogastroduodenoscopy  10/08/2011    Dr. [name] esophageal varices, antral erosion. next egd 09/2013  . Foot surgery  2003    lt foot  . Eye surgery      cataracts bilateral  . Orif humerus fracture Right 02/17/2013    Procedure: OPEN REDUCTION INTERNAL FIXATION (ORIF) RIGHT PROXIMAL HUMERUS FRACTURE;  Surgeon: [name], MD;  Location: [facility];  Service: Orthopedics;  Laterality: Right;  . Partial gastrectomy      family denies  . Esophagogastroduodenoscopy N/A 02/17/2014    Dr. Gala Romney: portal gastropathy, no varices. Due for  surveillance 2017  . Breast surgery Right few yrs ago    benign area removed  . Abdominal hysterectomy    . Cardiac catheterization  02/25/2007    Dr. [name]:  normal LV systolic function, mild irregularities of LAD    Medications: Prior to Admission medications   Medication Sig Start Date End Date Taking? Authorizing Provider  ALPRAZolam Duanne Moron) 0.5 MG tablet Take 1 tablet (0.5 mg total) by mouth 3 (three) times daily as needed for anxiety. 08/19/14   Erline Hau, MD  ALPRAZolam Duanne Moron) 0.5 MG tablet Take 1 tablet (0.5 mg total) by mouth 3 (three) times daily as needed for anxiety. 10/20/14   Francine Graven, DO  aspirin EC 325 MG EC tablet Take 1 tablet (325 mg total) by mouth every morning.  07/14/14   Nita Sells, MD  budesonide-formoterol (SYMBICORT) 160-4.5 MCG/ACT inhaler Inhale 2 puffs into the lungs at bedtime.     Historical Provider, MD  ciprofloxacin (CIPRO) 500 MG tablet Take 1 tablet (500 mg total) by mouth 2 (two) times daily. Patient not taking: Reported on 10/15/2014 08/19/14   Erline Hau, MD  enalapril (VASOTEC) 5 MG tablet Take 1 tablet by mouth daily. 08/25/14   Historical Provider, MD  ESTRACE VAGINAL 0.1 MG/GM vaginal cream Place 1 Applicatorful vaginally every other day. At  bedtime 08/11/13   Historical Provider, MD  furosemide (LASIX) 20 MG tablet Take 1 tablet (20 mg total) by mouth every morning. 09/03/14   Mahala Menghini, PA-C  insulin aspart (NOVOLOG FLEXPEN) 100 UNIT/ML FlexPen Inject 2-10 Units into the skin 3 (three) times daily with meals. Sliding Scale: 200-250= 2 units 251-300= 4 units 301-350= 6 units 351-400= 8 units If levels are over 400, GIVE 10 UNITS AND CALL MD    Historical Provider, MD  insulin detemir (LEVEMIR) 100 UNIT/ML injection Inject 60 Units into the skin at bedtime.     Historical Provider, MD  lactulose, encephalopathy, (GENERLAC) 10 GM/15ML SOLN Take 45 mLs (30 g total) by mouth 3 (three) times daily. 06/28/14   Kathie Dike, MD  meclizine (ANTIVERT) 25 MG tablet Take 25 mg by mouth 3 (three) times daily as needed for dizziness or nausea.     Historical Provider, MD  Multiple Vitamins-Minerals (CENTRUM SILVER PO) Take 1 tablet by mouth daily.     Historical Provider, MD  NEXIUM 40 MG capsule Take 40 mg by mouth every morning.  06/17/14   Historical Provider, MD  ondansetron (ZOFRAN) 4 MG tablet Take 1 tablet (4 mg total) by mouth every 6 (six) hours as needed for nausea. 08/06/14   Radene Gunning, NP  OxyCODONE (OXYCONTIN) 10 mg T12A 12 hr tablet Take 1 tablet (10 mg total) by mouth 2 (two) times daily as needed (severe pain). 08/19/14   Erline Hau, MD  OxyCODONE (OXYCONTIN) 10 mg T12A 12 hr tablet Take  1 tablet (10 mg total) by mouth every 12 (twelve) hours as needed (severe pain). 10/20/14   Francine Graven, DO  pregabalin (LYRICA) 100 MG capsule Take 100 mg by mouth 2 (two) times daily.    Historical Provider, MD  quinapril (ACCUPRIL) 5 MG tablet Take 5 mg by mouth at bedtime.    Historical Provider, MD  rifaximin (XIFAXAN) 550 MG TABS tablet Take 1 tablet (550 mg total) by mouth 2 (two) times daily. Patient not taking: Reported on 10/15/2014 06/28/14   Kathie Dike, MD  spironolactone (ALDACTONE) 50 MG tablet Take 1 tablet (50 mg  total) by mouth every morning. 10/19/14   Mahala Menghini, PA-C  triamcinolone cream (KENALOG) 0.1 % Apply 1 application topically daily as needed (for irritation).  06/26/13   Historical Provider, MD  ursodiol (URSO FORTE) 500 MG tablet Take 500 mg by mouth 2 (two) times daily.    Historical Provider, MD    Allergies:   Allergies  Allergen Reactions  . Morphine And Related Itching  . Compazine [Prochlorperazine Edisylate] Anxiety    Causes anxiety & nervousness    Social History:  reports that she has never smoked. She has never used smokeless tobacco. She reports that she does not drink alcohol or use illicit drugs.  Family History: Family History  Problem Relation Age of Onset  . Coronary artery disease      FH  . Diabetes      FH  . Heart disease Mother   . Colon cancer Neg Hx     Physical Exam: Filed Vitals:   12/15/14 2041 12/15/14 2100 12/15/14 2235 12/15/14 2356  BP: 128/61 114/58 126/55 143/72  Pulse: 69 66 70 72  Temp:      TempSrc:      Resp: 15 14 18 16   Height:      Weight:      SpO2: 98% 98% 98% 97%   General appearance: alert, cooperative and no distress Head: Normocephalic, without obvious abnormality, atraumatic Eyes: negative Nose: Nares normal. Septum midline. Mucosa normal. No drainage or sinus tenderness. Neck: no JVD and supple, symmetrical, trachea midline Lungs: clear to auscultation bilaterally Heart: regular rate  and rhythm, S1, S2 normal, no murmur, click, rub or gallop Abdomen: soft, non-tender; bowel sounds normal; no masses,  no organomegaly Extremities: extremities normal, atraumatic, no cyanosis or edema Pulses: 2+ and symmetric Skin: Skin color, texture, turgor normal. No rashes or lesions Neurologic: Grossly normal    Labs on Admission:   Recent Labs  12/15/14 2012  NA 135  K 4.2  CL 105  CO2 24  GLUCOSE 256*  BUN 21  CREATININE 0.85  CALCIUM 8.5    Recent Labs  12/15/14 2012  AST 20  ALT 15  ALKPHOS 95  BILITOT 1.4*  PROT 6.1  ALBUMIN 3.1*    Recent Labs  12/15/14 2012  LIPASE 23    Recent Labs  12/15/14 2012  WBC 4.1  NEUTROABS 2.3  HGB 12.0  HCT 36.5  MCV 81.8  PLT 138*   Radiological Exams on Admission: Dg Chest 2 View  12/15/2014   CLINICAL DATA:  Acute onset of weakness and confusion for 2 days. Initial encounter.  EXAM: CHEST  2 VIEW  COMPARISON:  Chest radiograph performed 10/20/2014  FINDINGS: The lungs are well-aerated. Chronically increased interstitial markings are seen. There is no evidence of focal opacification, pleural effusion or pneumothorax.  The heart is borderline enlarged. No acute osseous abnormalities are seen.  IMPRESSION: Mild chronic lung changes noted. Borderline cardiomegaly. No focal airspace consolidation seen.   Electronically Signed   By: Garald Balding M.D.   On: 12/15/2014 21:27    Assessment/Plan  74 yo female brought to ED by family unable to care for at home  Principal Problem:   Encounter for examination for admission to nursing home-  Obs overnight to see if we can get her placement again.  Unclear why she was released.  No acute issues at this time.   Active Problems:  Stable unless o/w noted.   Hepatic cirrhosis   Essential hypertension   Encephalopathy  chronic   Liver cirrhosis secondary to NASH   Hepatic encephalopathy   Weakness  obs on medical.  Presumptive full code.  Lashika Erker A 12/16/2014, 12:15  AM

## 2014-12-16 NOTE — Care Management Note (Signed)
    Page 1 of 1   12/16/2014     11:19:39 AM CARE MANAGEMENT NOTE 12/16/2014  Patient:  [name] [name], [name] [name]   Account Number:  [id number]  Date Initiated:  12/16/2014  Documentation initiated by:  Jolene Provost  Subjective/Objective Assessment:   Pt is from home, recently discharged from SNF and admitted for placement. CSW unable to find placement for pt inside [facility] and pt's husband wishes for her to return home. Pt active with [facility] and will have services resumed at D/C     Action/Plan:   Romualdo Bolk of [facility] aware of admission and pending discharge and will obtain information from pt's chart. No CM needs at this time.   Anticipated DC Date:  12/16/2014   Anticipated DC Plan:  [name]  In-house referral  Clinical Social Worker      DC Planning Services  CM consult      [facility] Choice  HOME HEALTH   Choice offered to / List presented to:  C-1 Patient        [place] arranged  HH-1 RN  [place].   Status of service:  Completed, signed off Medicare Important Message given?   (If response is "NO", the following Medicare IM given date fields will be blank) Date Medicare IM given:   Medicare IM given by:   Date Additional Medicare IM given:   Additional Medicare IM given by:    Discharge Disposition:  [place]  Per UR Regulation:    If discussed at Long Length of Stay Meetings, dates discussed:    Comments:  12/16/2014 [place], RN, MSN, CM

## 2014-12-16 NOTE — Clinical Social Work Placement (Signed)
Clinical Social Work Department CLINICAL SOCIAL WORK PLACEMENT NOTE 12/16/2014  Patient:  ERINE, PHENIX  Account Number:  [id number] Admit date:  12/15/2014  Clinical Social Worker:  [name], LCSW  Date/time:  12/16/2014 10:49 AM  Clinical Social Work is seeking post-discharge placement for this patient at the following level of care:   [place]   (*CSW will update this form in Epic as items are completed)   12/16/2014  Patient/family provided with [facility] Department of Clinical Social Work's list of facilities offering this level of care within the geographic area requested by the patient (or if unable, by the patient's family).  12/16/2014  Patient/family informed of their freedom to choose among providers that offer the needed level of care, that participate in Medicare, Medicaid or managed care program needed by the patient, have an available bed and are willing to accept the patient.  12/16/2014  Patient/family informed of [facility]' ownership interest in [facility], as well as of the fact that they are under no obligation to receive care at this facility.  PASARR submitted to EDS on  PASARR number received on   FL2 transmitted to all facilities in geographic area requested by pt/family on  12/16/2014 FL2 transmitted to all facilities within larger geographic area on   Patient informed that his/her managed care company has contracts with or will negotiate with  certain facilities, including the following:     Patient/family informed of bed offers received:  12/16/2014 Patient chooses bed at  Physician recommends and patient chooses bed at    Patient to be transferred to  on   Patient to be transferred to facility by  Patient and family notified of transfer on  Name of family member notified:    The following physician request were entered in Epic:   Additional Comments: Pt has existing pasarr.  [name], [place]

## 2014-12-16 NOTE — Progress Notes (Signed)
Lab notification of +MRSA at 11:43am 12/16/2014 by Joycelyn Schmid.  Patient placed on isolation and MRSA protocol initiated at 12/16/2014 11:45am.  RBV.

## 2014-12-16 NOTE — Clinical Social Work Psychosocial (Signed)
Clinical Social Work Department BRIEF PSYCHOSOCIAL ASSESSMENT 12/16/2014  Patient:  [name] [name], [name] [name]     Account Number:  [id number]     Admit date:  12/15/2014  Clinical Social Worker:  Wyatt Haste  Date/Time:  12/16/2014 10:21 AM  Referred by:  Physician  Date Referred:  12/16/2014 Referred for  SNF Placement   Other Referral:   Interview type:  Patient Other interview type:   husband- [name] [name]    PSYCHOSOCIAL DATA Living Status:  HUSBAND Admitted from facility:   Level of care:   Primary support name:  [name] [name] Primary support relationship to patient:  SPOUSE Degree of support available:   supportive, but having difficulty managing at home    CURRENT CONCERNS Current Concerns  Post-Acute Placement   Other Concerns:    SOCIAL WORK ASSESSMENT / PLAN CSW met with pt at bedside. Pt alert and oriented and reports she has been at home with her husband since January 19 when she left [facility]. She states that her care has been too much for him at home and they need to consider placement again. She deferred any further conversation about placement to her husband. CSW spoke with pt's husband who feels that pt has declined since returning home. He states he has to lift pt to chair. She has had several falls, at least 3 this week. CSW discussed placement with him as well. He is aware that pt would be Medicaid only as [facility] confirms that pt has used all of Medicare days. They are agreeable to turning over check. Husband agreeable to [facility] only. CSW sent referral and notified pt's husband that neither facility could offer bed. Shared that referral could be sent to other facilities in [facility] or surrounding counties. He states that there is no way he will send her out of [place] right now and plans to continue her care at home. He is open to home health social worker in case placement is needed from home. SNF list also provided.   Assessment/plan status:  Referral to [employer] Other assessment/ plan:   Information/referral to community resources:   SNF list  CM for home health social worker    PATIENT'S/FAMILY'S RESPONSE TO PLAN OF CARE: Pt defers decision to husband who has no intention of pt being placed outside of [place]. With no bed offers here, he plans to take pt home and is aware that d/c will be today. CSW notified CM and MD. CM will arrange home health social worker.       [name] [name], [place]

## 2014-12-16 NOTE — Progress Notes (Signed)
Pt discharged home today per Dr. Sarajane Jews. Pt's IV site D/C'd and WDL. Pt's VSS. Pt provided with home medication list and discharge instructions. Pt verbalized understanding. Pt left floor via WC in stable condition accompanied by NT.

## 2014-12-17 DIAGNOSIS — I10 Essential (primary) hypertension: Secondary | ICD-10-CM | POA: Diagnosis not present

## 2014-12-17 DIAGNOSIS — Z8719 Personal history of other diseases of the digestive system: Secondary | ICD-10-CM | POA: Diagnosis not present

## 2014-12-17 DIAGNOSIS — G609 Hereditary and idiopathic neuropathy, unspecified: Secondary | ICD-10-CM | POA: Diagnosis not present

## 2014-12-17 DIAGNOSIS — Z6834 Body mass index (BMI) 34.0-34.9, adult: Secondary | ICD-10-CM | POA: Diagnosis not present

## 2014-12-17 DIAGNOSIS — E1165 Type 2 diabetes mellitus with hyperglycemia: Secondary | ICD-10-CM | POA: Diagnosis not present

## 2014-12-17 DIAGNOSIS — K7460 Unspecified cirrhosis of liver: Secondary | ICD-10-CM | POA: Diagnosis not present

## 2014-12-17 DIAGNOSIS — E119 Type 2 diabetes mellitus without complications: Secondary | ICD-10-CM | POA: Diagnosis not present

## 2014-12-17 DIAGNOSIS — M25511 Pain in right shoulder: Secondary | ICD-10-CM | POA: Diagnosis not present

## 2014-12-17 DIAGNOSIS — E6609 Other obesity due to excess calories: Secondary | ICD-10-CM | POA: Diagnosis not present

## 2014-12-17 DIAGNOSIS — J45909 Unspecified asthma, uncomplicated: Secondary | ICD-10-CM | POA: Diagnosis not present

## 2014-12-17 DIAGNOSIS — D511 Vitamin B12 deficiency anemia due to selective vitamin B12 malabsorption with proteinuria: Secondary | ICD-10-CM | POA: Diagnosis not present

## 2014-12-17 DIAGNOSIS — I2510 Atherosclerotic heart disease of native coronary artery without angina pectoris: Secondary | ICD-10-CM | POA: Diagnosis not present

## 2014-12-20 DIAGNOSIS — I10 Essential (primary) hypertension: Secondary | ICD-10-CM | POA: Diagnosis not present

## 2014-12-20 DIAGNOSIS — G609 Hereditary and idiopathic neuropathy, unspecified: Secondary | ICD-10-CM | POA: Diagnosis not present

## 2014-12-20 DIAGNOSIS — E119 Type 2 diabetes mellitus without complications: Secondary | ICD-10-CM | POA: Diagnosis not present

## 2014-12-20 DIAGNOSIS — K7460 Unspecified cirrhosis of liver: Secondary | ICD-10-CM | POA: Diagnosis not present

## 2014-12-20 DIAGNOSIS — Z8719 Personal history of other diseases of the digestive system: Secondary | ICD-10-CM | POA: Diagnosis not present

## 2014-12-20 DIAGNOSIS — I2510 Atherosclerotic heart disease of native coronary artery without angina pectoris: Secondary | ICD-10-CM | POA: Diagnosis not present

## 2014-12-20 DIAGNOSIS — J45909 Unspecified asthma, uncomplicated: Secondary | ICD-10-CM | POA: Diagnosis not present

## 2014-12-20 DIAGNOSIS — M25511 Pain in right shoulder: Secondary | ICD-10-CM | POA: Diagnosis not present

## 2014-12-21 DIAGNOSIS — K7460 Unspecified cirrhosis of liver: Secondary | ICD-10-CM | POA: Diagnosis not present

## 2014-12-21 DIAGNOSIS — Z8719 Personal history of other diseases of the digestive system: Secondary | ICD-10-CM | POA: Diagnosis not present

## 2014-12-21 DIAGNOSIS — E119 Type 2 diabetes mellitus without complications: Secondary | ICD-10-CM | POA: Diagnosis not present

## 2014-12-21 DIAGNOSIS — I2510 Atherosclerotic heart disease of native coronary artery without angina pectoris: Secondary | ICD-10-CM | POA: Diagnosis not present

## 2014-12-21 DIAGNOSIS — G609 Hereditary and idiopathic neuropathy, unspecified: Secondary | ICD-10-CM | POA: Diagnosis not present

## 2014-12-21 DIAGNOSIS — I10 Essential (primary) hypertension: Secondary | ICD-10-CM | POA: Diagnosis not present

## 2014-12-21 DIAGNOSIS — J45909 Unspecified asthma, uncomplicated: Secondary | ICD-10-CM | POA: Diagnosis not present

## 2014-12-21 DIAGNOSIS — M25511 Pain in right shoulder: Secondary | ICD-10-CM | POA: Diagnosis not present

## 2014-12-23 ENCOUNTER — Emergency Department (HOSPITAL_COMMUNITY): Payer: Medicare Other

## 2014-12-23 ENCOUNTER — Observation Stay (HOSPITAL_COMMUNITY)
Admission: EM | Admit: 2014-12-23 | Discharge: 2014-12-24 | Disposition: A | Discharge status: Home / Self Care | Payer: Medicare Other | Attending: Internal Medicine | Admitting: Internal Medicine

## 2014-12-23 ENCOUNTER — Encounter (HOSPITAL_COMMUNITY): Payer: Self-pay

## 2014-12-23 DIAGNOSIS — E785 Hyperlipidemia, unspecified: Secondary | ICD-10-CM | POA: Insufficient documentation

## 2014-12-23 DIAGNOSIS — Z043 Encounter for examination and observation following other accident: Principal | ICD-10-CM | POA: Insufficient documentation

## 2014-12-23 DIAGNOSIS — Z973 Presence of spectacles and contact lenses: Secondary | ICD-10-CM | POA: Insufficient documentation

## 2014-12-23 DIAGNOSIS — E669 Obesity, unspecified: Secondary | ICD-10-CM | POA: Diagnosis not present

## 2014-12-23 DIAGNOSIS — G609 Hereditary and idiopathic neuropathy, unspecified: Secondary | ICD-10-CM | POA: Diagnosis not present

## 2014-12-23 DIAGNOSIS — W010XXA Fall on same level from slipping, tripping and stumbling without subsequent striking against object, initial encounter: Secondary | ICD-10-CM | POA: Insufficient documentation

## 2014-12-23 DIAGNOSIS — M25511 Pain in right shoulder: Secondary | ICD-10-CM | POA: Diagnosis not present

## 2014-12-23 DIAGNOSIS — D619 Aplastic anemia, unspecified: Secondary | ICD-10-CM | POA: Insufficient documentation

## 2014-12-23 DIAGNOSIS — R030 Elevated blood-pressure reading, without diagnosis of hypertension: Secondary | ICD-10-CM | POA: Diagnosis not present

## 2014-12-23 DIAGNOSIS — Y9289 Other specified places as the place of occurrence of the external cause: Secondary | ICD-10-CM | POA: Insufficient documentation

## 2014-12-23 DIAGNOSIS — Z794 Long term (current) use of insulin: Secondary | ICD-10-CM | POA: Diagnosis not present

## 2014-12-23 DIAGNOSIS — S79912A Unspecified injury of left hip, initial encounter: Secondary | ICD-10-CM | POA: Diagnosis not present

## 2014-12-23 DIAGNOSIS — R296 Repeated falls: Secondary | ICD-10-CM

## 2014-12-23 DIAGNOSIS — K859 Acute pancreatitis, unspecified: Secondary | ICD-10-CM | POA: Diagnosis not present

## 2014-12-23 DIAGNOSIS — Z86018 Personal history of other benign neoplasm: Secondary | ICD-10-CM | POA: Diagnosis not present

## 2014-12-23 DIAGNOSIS — Z79899 Other long term (current) drug therapy: Secondary | ICD-10-CM | POA: Insufficient documentation

## 2014-12-23 DIAGNOSIS — J45909 Unspecified asthma, uncomplicated: Secondary | ICD-10-CM | POA: Insufficient documentation

## 2014-12-23 DIAGNOSIS — G8929 Other chronic pain: Secondary | ICD-10-CM | POA: Diagnosis not present

## 2014-12-23 DIAGNOSIS — Z7982 Long term (current) use of aspirin: Secondary | ICD-10-CM | POA: Diagnosis not present

## 2014-12-23 DIAGNOSIS — Y9389 Activity, other specified: Secondary | ICD-10-CM | POA: Insufficient documentation

## 2014-12-23 DIAGNOSIS — R531 Weakness: Secondary | ICD-10-CM | POA: Diagnosis not present

## 2014-12-23 DIAGNOSIS — E119 Type 2 diabetes mellitus without complications: Secondary | ICD-10-CM | POA: Diagnosis not present

## 2014-12-23 DIAGNOSIS — D61818 Other pancytopenia: Secondary | ICD-10-CM | POA: Diagnosis present

## 2014-12-23 DIAGNOSIS — K7469 Other cirrhosis of liver: Secondary | ICD-10-CM | POA: Diagnosis present

## 2014-12-23 DIAGNOSIS — R5381 Other malaise: Secondary | ICD-10-CM | POA: Diagnosis present

## 2014-12-23 DIAGNOSIS — K317 Polyp of stomach and duodenum: Secondary | ICD-10-CM | POA: Insufficient documentation

## 2014-12-23 DIAGNOSIS — Y998 Other external cause status: Secondary | ICD-10-CM | POA: Diagnosis not present

## 2014-12-23 DIAGNOSIS — K2950 Unspecified chronic gastritis without bleeding: Secondary | ICD-10-CM | POA: Insufficient documentation

## 2014-12-23 DIAGNOSIS — E1142 Type 2 diabetes mellitus with diabetic polyneuropathy: Secondary | ICD-10-CM | POA: Diagnosis present

## 2014-12-23 DIAGNOSIS — K7581 Nonalcoholic steatohepatitis (NASH): Secondary | ICD-10-CM

## 2014-12-23 DIAGNOSIS — K219 Gastro-esophageal reflux disease without esophagitis: Secondary | ICD-10-CM | POA: Diagnosis present

## 2014-12-23 DIAGNOSIS — S299XXA Unspecified injury of thorax, initial encounter: Secondary | ICD-10-CM | POA: Diagnosis not present

## 2014-12-23 DIAGNOSIS — I2510 Atherosclerotic heart disease of native coronary artery without angina pectoris: Secondary | ICD-10-CM | POA: Insufficient documentation

## 2014-12-23 DIAGNOSIS — K5732 Diverticulitis of large intestine without perforation or abscess without bleeding: Secondary | ICD-10-CM | POA: Insufficient documentation

## 2014-12-23 DIAGNOSIS — W19XXXA Unspecified fall, initial encounter: Secondary | ICD-10-CM | POA: Diagnosis present

## 2014-12-23 DIAGNOSIS — D696 Thrombocytopenia, unspecified: Secondary | ICD-10-CM | POA: Diagnosis present

## 2014-12-23 DIAGNOSIS — M25562 Pain in left knee: Secondary | ICD-10-CM | POA: Diagnosis not present

## 2014-12-23 DIAGNOSIS — IMO0002 Reserved for concepts with insufficient information to code with codable children: Secondary | ICD-10-CM | POA: Diagnosis present

## 2014-12-23 DIAGNOSIS — K7460 Unspecified cirrhosis of liver: Secondary | ICD-10-CM | POA: Diagnosis not present

## 2014-12-23 DIAGNOSIS — M25552 Pain in left hip: Secondary | ICD-10-CM | POA: Diagnosis not present

## 2014-12-23 DIAGNOSIS — I10 Essential (primary) hypertension: Secondary | ICD-10-CM | POA: Insufficient documentation

## 2014-12-23 DIAGNOSIS — M79603 Pain in arm, unspecified: Secondary | ICD-10-CM | POA: Diagnosis not present

## 2014-12-23 DIAGNOSIS — E1165 Type 2 diabetes mellitus with hyperglycemia: Secondary | ICD-10-CM

## 2014-12-23 DIAGNOSIS — Z8719 Personal history of other diseases of the digestive system: Secondary | ICD-10-CM | POA: Diagnosis not present

## 2014-12-23 LAB — CBG MONITORING, ED: Glucose-Capillary: 188 mg/dL — ABNORMAL HIGH (ref 70–99)

## 2014-12-23 LAB — CBC WITH DIFFERENTIAL/PLATELET
BASOS ABS: 0 10*3/uL (ref 0.0–0.1)
BASOS PCT: 0 % (ref 0–1)
Eosinophils Absolute: 0.2 10*3/uL (ref 0.0–0.7)
Eosinophils Relative: 3 % (ref 0–5)
HCT: 38 % (ref 36.0–46.0)
Hemoglobin: 12.6 g/dL (ref 12.0–15.0)
Lymphocytes Relative: 27 % (ref 12–46)
Lymphs Abs: 1.5 10*3/uL (ref 0.7–4.0)
MCH: 27.1 pg (ref 26.0–34.0)
MCHC: 33.2 g/dL (ref 30.0–36.0)
MCV: 81.7 fL (ref 78.0–100.0)
Monocytes Absolute: 0.6 10*3/uL (ref 0.1–1.0)
Monocytes Relative: 11 % (ref 3–12)
NEUTROS ABS: 3.4 10*3/uL (ref 1.7–7.7)
NEUTROS PCT: 59 % (ref 43–77)
Platelets: 187 10*3/uL (ref 150–400)
RBC: 4.65 MIL/uL (ref 3.87–5.11)
RDW: 15.3 % (ref 11.5–15.5)
WBC: 5.7 10*3/uL (ref 4.0–10.5)

## 2014-12-23 LAB — COMPREHENSIVE METABOLIC PANEL
ALBUMIN: 3.6 g/dL (ref 3.5–5.2)
ALK PHOS: 105 U/L (ref 39–117)
ALT: 16 U/L (ref 0–35)
AST: 21 U/L (ref 0–37)
Anion gap: 6 (ref 5–15)
BUN: 15 mg/dL (ref 6–23)
CHLORIDE: 104 mmol/L (ref 96–112)
CO2: 25 mmol/L (ref 19–32)
Calcium: 8.9 mg/dL (ref 8.4–10.5)
Creatinine, Ser: 0.76 mg/dL (ref 0.50–1.10)
GFR, EST NON AFRICAN AMERICAN: 83 mL/min — AB (ref >90)
Glucose, Bld: 188 mg/dL — ABNORMAL HIGH (ref 70–99)
POTASSIUM: 3.8 mmol/L (ref 3.5–5.1)
SODIUM: 135 mmol/L (ref 135–145)
Total Bilirubin: 1.6 mg/dL — ABNORMAL HIGH (ref 0.3–1.2)
Total Protein: 6.6 g/dL (ref 6.0–8.3)

## 2014-12-23 LAB — AMMONIA: Ammonia: 62 umol/L — ABNORMAL HIGH (ref 11–32)

## 2014-12-23 LAB — LIPASE, BLOOD: Lipase: 37 U/L (ref 11–59)

## 2014-12-23 NOTE — ED Provider Notes (Signed)
CSN: [id number]     Arrival date & time 12/23/14  2132 History  This chart was scribed for [employer]. Alvino Chapel, MD by Rayfield Citizen, ED Scribe. This patient was seen in room APA04/APA04 and the patient's care was started at 9:43 PM.      Chief Complaint  Patient presents with  . Fall   Patient is a 74 y.o. female presenting with fall. The history is provided by the patient and a relative. No language interpreter was used.  Fall Pertinent negatives include no chest pain, no headaches and no shortness of breath.     HPI Comments: [name] [name] is a 74 y.o. female with past medical history of elevated ammonia, brought in by ambulance, who presents to the Emergency Department complaining of fall. She explains that she slipped while attempting to sit on the toilet just PTA; her husband helped her to the floor - she did not actually hit the floor and denies any injury from the incident. She denies pain, appetite decrease, confusion, chest pain, trouble breathing, headaches, abnormal diarrhea (some diarrhea at baseline due to medications). She reports that she can normally tell when her ammonia is high and feels that she has been doing "well' recently.   Past Medical History  Diagnosis Date  . CAD in native artery   . Thrombocytopenia, unspecified   . Unspecified hereditary and idiopathic peripheral neuropathy   . Unspecified fall   . Other and unspecified hyperlipidemia   . Unspecified essential hypertension   . Obesity, unspecified   . Personal history of neurosis   . Intrinsic asthma, unspecified   . Allergic rhinitis, cause unspecified   . Cirrhosis of liver     NASH, afp on 06/17/12 =3.7, per pt she had hep B vaccines in 1996, hep A in process.   . Colon adenomas 03/08/10    tcs by Dr. Gala Romney  . Hyperplastic polyps of stomach 03/08/10    tcs by Dr. Dudley Major  . Chronic gastritis 03/09/11    egd by Dr. Gala Romney  . History of hemorrhoids 03/08/10    tcs- internal and external  . Diverticula of colon  03/08/10    L side  . Esophagitis, erosive 03/08/10  . GERD (gastroesophageal reflux disease)   . Wears glasses   . Type II or unspecified type diabetes mellitus without mention of complication, not stated as uncontrolled   . Vertigo   . Pancreatitis   . Bilateral leg weakness   . Thrombocytopenia 04/12/2014  . Pancytopenia, acquired 04/12/2014  . Cirrhosis   . Abnormal EKG     hx of left anterior fasicular block on 04-09-13 ekg epic  . Chronic shoulder pain    Past Surgical History  Procedure Laterality Date  . Hysterectomy and btl      s/p  . Tumor excision  2003    rt arm and left foot  . Colonoscopy  03/08/10    Dr. [name]-->ext/int hemorrhoids, anal paipilla, rectal polyps, desc polyps, cecal polyp, left-sided diverticula. suboptiomal prep. next TCS 02/2015. multiple adenomas  . Esophagogastroduodenoscopy  03/08/10    Dr. [name] erosive RE, small hh, antral erosions, small 1cm are of mucosal indentation along gastric body of doubtful significance, cystic nodularity of hypophyarynx, base of tongue, base of epiglottis. benign gastric biopsies  . Tubal ligation    . Esophagogastroduodenoscopy  10/08/2011    Dr. [name] esophageal varices, antral erosion. next egd 09/2013  . Foot surgery  2003    lt foot  . Eye  surgery      cataracts bilateral  . Orif humerus fracture Right 02/17/2013    Procedure: OPEN REDUCTION INTERNAL FIXATION (ORIF) RIGHT PROXIMAL HUMERUS FRACTURE;  Surgeon: [name], MD;  Location: [facility];  Service: Orthopedics;  Laterality: Right;  . Partial gastrectomy      family denies  . Esophagogastroduodenoscopy N/A 02/17/2014    Dr. Gala Romney: portal gastropathy, no varices. Due for surveillance 2017  . Breast surgery Right few yrs ago    benign area removed  . Abdominal hysterectomy    . Cardiac catheterization  02/25/2007    Dr. [name]:  normal LV systolic function, mild irregularities of LAD   Family History  Problem Relation Age of Onset  . Coronary artery  disease      FH  . Diabetes      FH  . Heart disease Mother   . Colon cancer Neg Hx    History  Substance Use Topics  . Smoking status: Never Smoker   . Smokeless tobacco: Never Used  . Alcohol Use: No   OB History    No data available     Review of Systems  Constitutional: Negative for appetite change.  Respiratory: Negative for shortness of breath.   Cardiovascular: Negative for chest pain.  Neurological: Negative for headaches.  Psychiatric/Behavioral: Negative for confusion.      Allergies  Morphine and related and Compazine  Home Medications   Prior to Admission medications   Medication Sig Start Date End Date Taking? Authorizing Provider  ALPRAZolam Duanne Moron) 0.5 MG tablet Take 1 tablet (0.5 mg total) by mouth 3 (three) times daily as needed for anxiety. 08/19/14  Yes Erline Hau, MD  aspirin EC 325 MG EC tablet Take 1 tablet (325 mg total) by mouth every morning. 07/14/14  Yes Nita Sells, MD  budesonide-formoterol (SYMBICORT) 160-4.5 MCG/ACT inhaler Inhale 2 puffs into the lungs at bedtime.    Yes Historical Provider, MD  enalapril (VASOTEC) 5 MG tablet Take 1 tablet by mouth daily. 08/25/14  Yes Historical Provider, MD  ESTRACE VAGINAL 0.1 MG/GM vaginal cream Place 1 Applicatorful vaginally every other day. At  bedtime 08/11/13  Yes Historical Provider, MD  furosemide (LASIX) 20 MG tablet Take 1 tablet (20 mg total) by mouth every morning. 09/03/14  Yes Mahala Menghini, PA-C  insulin aspart (NOVOLOG FLEXPEN) 100 UNIT/ML FlexPen Inject 2-10 Units into the skin 3 (three) times daily with meals. Sliding Scale: 200-250= 2 units 251-300= 4 units 301-350= 6 units 351-400= 8 units If levels are over 400, GIVE 10 UNITS AND CALL MD   Yes Historical Provider, MD  insulin detemir (LEVEMIR) 100 UNIT/ML injection Inject 60 Units into the skin at bedtime.    Yes Historical Provider, MD  meclizine (ANTIVERT) 25 MG tablet Take 25 mg by mouth 3 (three) times  daily as needed for dizziness or nausea.    Yes Historical Provider, MD  Multiple Vitamins-Minerals (CENTRUM SILVER PO) Take 1 tablet by mouth daily.    Yes Historical Provider, MD  NEXIUM 40 MG capsule Take 40 mg by mouth every morning.  06/17/14  Yes Historical Provider, MD  ondansetron (ZOFRAN) 4 MG tablet Take 1 tablet (4 mg total) by mouth every 6 (six) hours as needed for nausea. 08/06/14  Yes Radene Gunning, NP  OxyCODONE (OXYCONTIN) 10 mg T12A 12 hr tablet Take 1 tablet (10 mg total) by mouth 2 (two) times daily as needed (severe pain). 08/19/14  Yes [place],  MD  pregabalin (LYRICA) 100 MG capsule Take 100 mg by mouth 2 (two) times daily.   Yes Historical Provider, MD  quinapril (ACCUPRIL) 5 MG tablet Take 5 mg by mouth at bedtime.   Yes Historical Provider, MD  spironolactone (ALDACTONE) 50 MG tablet Take 1 tablet (50 mg total) by mouth every morning. 10/19/14  Yes Mahala Menghini, PA-C  triamcinolone cream (KENALOG) 0.1 % Apply 1 application topically daily as needed (for irritation).  06/26/13  Yes Historical Provider, MD  ursodiol (URSO FORTE) 500 MG tablet Take 500 mg by mouth 2 (two) times daily.   Yes Historical Provider, MD  lactulose (CHRONULAC) 10 GM/15ML solution Take 60 mLs by mouth 2 (two) times daily. 10/20/14   Historical Provider, MD  lactulose, encephalopathy, (GENERLAC) 10 GM/15ML SOLN Take 45 mLs (30 g total) by mouth 3 (three) times daily. Patient not taking: Reported on 12/23/2014 06/28/14   Kathie Dike, MD  rifaximin (XIFAXAN) 550 MG TABS tablet Take 1 tablet (550 mg total) by mouth 2 (two) times daily. Patient not taking: Reported on 12/23/2014 06/28/14   Kathie Dike, MD   BP 131/63 mmHg  Pulse 75  Temp(Src) 97.8 F (36.6 C) (Oral)  Resp 18  Ht 5\' 8"  (1.727 m)  Wt 232 lb (105.235 kg)  BMI 35.28 kg/m2  SpO2 99% Physical Exam  Constitutional: She appears well-developed and well-nourished. No distress.  Awake, appropriate  HENT:  Head:  Normocephalic.  Mouth/Throat: Oropharynx is clear and moist. No oropharyngeal exudate.  Eyes:  Pupils constricted  Neck: Normal range of motion. Neck supple. No JVD present.  Cardiovascular: Normal rate, regular rhythm and normal heart sounds.  Exam reveals no gallop and no friction rub.   No murmur heard. Pulmonary/Chest: Effort normal. No respiratory distress. She has no wheezes. She has no rales.  Abdominal: Soft. There is no tenderness. There is no rebound and no guarding.  No RUQ tenderness  Musculoskeletal: Normal range of motion. She exhibits edema.  No tenderness on extremities; pitting edema in BLEs but worse on left. No real pain on left lower extremity but there appears to be some shortening.  Neurological: She is alert.  Skin: Skin is warm and dry. No rash noted.  Psychiatric: She has a normal mood and affect. Her behavior is normal.  Nursing note and vitals reviewed.   ED Course  Procedures   DIAGNOSTIC STUDIES: Oxygen Saturation is 99% on RA, normal by my interpretation.    COORDINATION OF CARE: 9:45 PM Discussed treatment plan with pt at bedside and pt agreed to plan.   Labs Review Labs Reviewed  COMPREHENSIVE METABOLIC PANEL - Abnormal; Notable for the following:    Glucose, Bld 188 (*)    Total Bilirubin 1.6 (*)    GFR calc non Af Amer 83 (*)    All other components within normal limits  AMMONIA - Abnormal; Notable for the following:    Ammonia 62 (*)    All other components within normal limits  CBG MONITORING, ED - Abnormal; Notable for the following:    Glucose-Capillary 188 (*)    All other components within normal limits  CBC WITH DIFFERENTIAL/PLATELET  LIPASE, BLOOD  URINALYSIS, ROUTINE W REFLEX MICROSCOPIC    Imaging Review Dg Chest 1 View  12/23/2014   CLINICAL DATA:  [place] from coli tonight. Weakness. Previous history of coronary disease, asthma, diabetes, heart catheterization and right shoulder injury.  EXAM: CHEST  1 VIEW  COMPARISON:   12/15/2014  FINDINGS: Postoperative changes in  the right shoulder. Shallow inspiration. Normal heart size and pulmonary vascularity. No focal airspace disease or consolidation in the lungs. No blunting of costophrenic angles. No pneumothorax. Mediastinal contours appear intact.  IMPRESSION: No active disease.   Electronically Signed   By: Lucienne Capers M.D.   On: 12/23/2014 22:33     EKG Interpretation None      MDM   Final diagnoses:  Fall    Patent withgeneralized weakness. Has a history of [name]. Has history of hepatic encephalopathy however ammonia is lower than her baseline. Urinalysis pending. Family states he been having to feed her at home. She does have some care at home but they have refused nursing home placement in the past. His response   I personally performed the services described in this documentation, which was scribed in my presence. The recorded information has been reviewed and is accurate.     Jasper Riling. Alvino Chapel, MD 12/23/14 [phone]

## 2014-12-23 NOTE — ED Notes (Signed)
Pt from home via ems, reportedly started to fall while in the bathroom, was assisted to the floor and did not actually hit the floor or c/o injury from fall.  Pt has history of elevated ammonia levels and per ems, family thinks it may be elevated even more now.

## 2014-12-24 DIAGNOSIS — R2681 Unsteadiness on feet: Secondary | ICD-10-CM | POA: Diagnosis not present

## 2014-12-24 DIAGNOSIS — W010XXA Fall on same level from slipping, tripping and stumbling without subsequent striking against object, initial encounter: Secondary | ICD-10-CM

## 2014-12-24 DIAGNOSIS — K649 Unspecified hemorrhoids: Secondary | ICD-10-CM | POA: Diagnosis not present

## 2014-12-24 DIAGNOSIS — G609 Hereditary and idiopathic neuropathy, unspecified: Secondary | ICD-10-CM

## 2014-12-24 DIAGNOSIS — D696 Thrombocytopenia, unspecified: Secondary | ICD-10-CM | POA: Diagnosis not present

## 2014-12-24 DIAGNOSIS — R531 Weakness: Secondary | ICD-10-CM | POA: Diagnosis not present

## 2014-12-24 DIAGNOSIS — Z043 Encounter for examination and observation following other accident: Secondary | ICD-10-CM | POA: Diagnosis not present

## 2014-12-24 DIAGNOSIS — E1142 Type 2 diabetes mellitus with diabetic polyneuropathy: Secondary | ICD-10-CM | POA: Diagnosis not present

## 2014-12-24 DIAGNOSIS — Z973 Presence of spectacles and contact lenses: Secondary | ICD-10-CM | POA: Diagnosis not present

## 2014-12-24 DIAGNOSIS — K859 Acute pancreatitis, unspecified: Secondary | ICD-10-CM | POA: Diagnosis not present

## 2014-12-24 DIAGNOSIS — R0602 Shortness of breath: Secondary | ICD-10-CM | POA: Diagnosis not present

## 2014-12-24 DIAGNOSIS — F489 Nonpsychotic mental disorder, unspecified: Secondary | ICD-10-CM | POA: Diagnosis not present

## 2014-12-24 DIAGNOSIS — E0841 Diabetes mellitus due to underlying condition with diabetic mononeuropathy: Secondary | ICD-10-CM | POA: Diagnosis not present

## 2014-12-24 DIAGNOSIS — S42399S Other fracture of shaft of unspecified humerus, sequela: Secondary | ICD-10-CM | POA: Diagnosis not present

## 2014-12-24 DIAGNOSIS — R319 Hematuria, unspecified: Secondary | ICD-10-CM | POA: Diagnosis not present

## 2014-12-24 DIAGNOSIS — Z79899 Other long term (current) drug therapy: Secondary | ICD-10-CM | POA: Diagnosis not present

## 2014-12-24 DIAGNOSIS — J45909 Unspecified asthma, uncomplicated: Secondary | ICD-10-CM | POA: Diagnosis not present

## 2014-12-24 DIAGNOSIS — K449 Diaphragmatic hernia without obstruction or gangrene: Secondary | ICD-10-CM | POA: Diagnosis not present

## 2014-12-24 DIAGNOSIS — I2510 Atherosclerotic heart disease of native coronary artery without angina pectoris: Secondary | ICD-10-CM

## 2014-12-24 DIAGNOSIS — R1030 Lower abdominal pain, unspecified: Secondary | ICD-10-CM | POA: Diagnosis not present

## 2014-12-24 DIAGNOSIS — E785 Hyperlipidemia, unspecified: Secondary | ICD-10-CM | POA: Diagnosis not present

## 2014-12-24 DIAGNOSIS — E876 Hypokalemia: Secondary | ICD-10-CM | POA: Diagnosis not present

## 2014-12-24 DIAGNOSIS — Z9181 History of falling: Secondary | ICD-10-CM | POA: Diagnosis not present

## 2014-12-24 DIAGNOSIS — R41841 Cognitive communication deficit: Secondary | ICD-10-CM | POA: Diagnosis not present

## 2014-12-24 DIAGNOSIS — K317 Polyp of stomach and duodenum: Secondary | ICD-10-CM | POA: Diagnosis not present

## 2014-12-24 DIAGNOSIS — K2950 Unspecified chronic gastritis without bleeding: Secondary | ICD-10-CM | POA: Diagnosis not present

## 2014-12-24 DIAGNOSIS — K219 Gastro-esophageal reflux disease without esophagitis: Secondary | ICD-10-CM | POA: Diagnosis not present

## 2014-12-24 DIAGNOSIS — E118 Type 2 diabetes mellitus with unspecified complications: Secondary | ICD-10-CM | POA: Diagnosis not present

## 2014-12-24 DIAGNOSIS — L03039 Cellulitis of unspecified toe: Secondary | ICD-10-CM | POA: Diagnosis not present

## 2014-12-24 DIAGNOSIS — D619 Aplastic anemia, unspecified: Secondary | ICD-10-CM | POA: Diagnosis not present

## 2014-12-24 DIAGNOSIS — K7469 Other cirrhosis of liver: Secondary | ICD-10-CM | POA: Diagnosis not present

## 2014-12-24 DIAGNOSIS — K7460 Unspecified cirrhosis of liver: Secondary | ICD-10-CM | POA: Diagnosis not present

## 2014-12-24 DIAGNOSIS — I10 Essential (primary) hypertension: Secondary | ICD-10-CM | POA: Diagnosis not present

## 2014-12-24 DIAGNOSIS — K7290 Hepatic failure, unspecified without coma: Secondary | ICD-10-CM | POA: Diagnosis not present

## 2014-12-24 DIAGNOSIS — R296 Repeated falls: Secondary | ICD-10-CM | POA: Diagnosis not present

## 2014-12-24 DIAGNOSIS — M25511 Pain in right shoulder: Secondary | ICD-10-CM | POA: Diagnosis not present

## 2014-12-24 DIAGNOSIS — E119 Type 2 diabetes mellitus without complications: Secondary | ICD-10-CM | POA: Diagnosis not present

## 2014-12-24 DIAGNOSIS — R1011 Right upper quadrant pain: Secondary | ICD-10-CM | POA: Diagnosis not present

## 2014-12-24 DIAGNOSIS — Z794 Long term (current) use of insulin: Secondary | ICD-10-CM | POA: Diagnosis not present

## 2014-12-24 DIAGNOSIS — E669 Obesity, unspecified: Secondary | ICD-10-CM | POA: Diagnosis not present

## 2014-12-24 DIAGNOSIS — G9340 Encephalopathy, unspecified: Secondary | ICD-10-CM | POA: Diagnosis not present

## 2014-12-24 DIAGNOSIS — K210 Gastro-esophageal reflux disease with esophagitis: Secondary | ICD-10-CM | POA: Diagnosis not present

## 2014-12-24 DIAGNOSIS — M6281 Muscle weakness (generalized): Secondary | ICD-10-CM | POA: Diagnosis not present

## 2014-12-24 DIAGNOSIS — G4700 Insomnia, unspecified: Secondary | ICD-10-CM | POA: Diagnosis not present

## 2014-12-24 DIAGNOSIS — K5790 Diverticulosis of intestine, part unspecified, without perforation or abscess without bleeding: Secondary | ICD-10-CM | POA: Diagnosis not present

## 2014-12-24 DIAGNOSIS — R05 Cough: Secondary | ICD-10-CM | POA: Diagnosis not present

## 2014-12-24 DIAGNOSIS — Z86018 Personal history of other benign neoplasm: Secondary | ICD-10-CM | POA: Diagnosis not present

## 2014-12-24 DIAGNOSIS — K5732 Diverticulitis of large intestine without perforation or abscess without bleeding: Secondary | ICD-10-CM | POA: Diagnosis not present

## 2014-12-24 DIAGNOSIS — W19XXXA Unspecified fall, initial encounter: Secondary | ICD-10-CM | POA: Diagnosis present

## 2014-12-24 DIAGNOSIS — G8929 Other chronic pain: Secondary | ICD-10-CM | POA: Diagnosis not present

## 2014-12-24 DIAGNOSIS — D649 Anemia, unspecified: Secondary | ICD-10-CM | POA: Diagnosis not present

## 2014-12-24 DIAGNOSIS — H109 Unspecified conjunctivitis: Secondary | ICD-10-CM | POA: Diagnosis not present

## 2014-12-24 DIAGNOSIS — S91109A Unspecified open wound of unspecified toe(s) without damage to nail, initial encounter: Secondary | ICD-10-CM | POA: Diagnosis not present

## 2014-12-24 DIAGNOSIS — D126 Benign neoplasm of colon, unspecified: Secondary | ICD-10-CM | POA: Diagnosis not present

## 2014-12-24 DIAGNOSIS — Z7982 Long term (current) use of aspirin: Secondary | ICD-10-CM | POA: Diagnosis not present

## 2014-12-24 DIAGNOSIS — K2210 Ulcer of esophagus without bleeding: Secondary | ICD-10-CM | POA: Diagnosis not present

## 2014-12-24 LAB — URINALYSIS, ROUTINE W REFLEX MICROSCOPIC
Bilirubin Urine: NEGATIVE
Glucose, UA: NEGATIVE mg/dL
Hgb urine dipstick: NEGATIVE
Nitrite: NEGATIVE
PROTEIN: NEGATIVE mg/dL
Specific Gravity, Urine: >1.03 — ABNORMAL HIGH (ref 1.005–1.030)
UROBILINOGEN UA: 0.2 mg/dL (ref 0.0–1.0)
pH: 5.5 (ref 5.0–8.0)

## 2014-12-24 LAB — GLUCOSE, CAPILLARY
GLUCOSE-CAPILLARY: 178 mg/dL — AB (ref 70–99)
Glucose-Capillary: 172 mg/dL — ABNORMAL HIGH (ref 70–99)

## 2014-12-24 LAB — URINE MICROSCOPIC-ADD ON

## 2014-12-24 MED ORDER — SODIUM CHLORIDE 0.9 % IJ SOLN: 3.0000 mL | INTRAMUSCULAR | Status: DC | PRN

## 2014-12-24 MED ORDER — MECLIZINE HCL 12.5 MG PO TABS: 25.0000 mg | ORAL_TABLET | Freq: Three times a day (TID) | ORAL | Status: DC | PRN

## 2014-12-24 MED ORDER — OXYCODONE HCL ER 10 MG PO T12A: 10.0000 mg | EXTENDED_RELEASE_TABLET | Freq: Two times a day (BID) | ORAL | Status: DC | PRN

## 2014-12-24 MED ORDER — ONDANSETRON HCL 4 MG PO TABS: 4.0000 mg | ORAL_TABLET | Freq: Four times a day (QID) | ORAL | Status: DC | PRN

## 2014-12-24 MED ORDER — SODIUM CHLORIDE 0.9 % IV SOLN: 250.0000 mL | INTRAVENOUS | Status: DC | PRN

## 2014-12-24 MED ORDER — DEXTROSE 5 % IV SOLN
INTRAVENOUS | Status: AC
  Filled 2014-12-24: qty 10

## 2014-12-24 MED ORDER — BUDESONIDE-FORMOTEROL FUMARATE 160-4.5 MCG/ACT IN AERO
INHALATION_SPRAY | RESPIRATORY_TRACT | Status: AC
  Filled 2014-12-24: qty 6

## 2014-12-24 MED ORDER — LACTULOSE 10 GM/15ML PO SOLN
40.0000 g | Freq: Two times a day (BID) | ORAL | Status: DC
  Administered 2014-12-24: 40 g via ORAL
  Filled 2014-12-24: qty 60

## 2014-12-24 MED ORDER — PANTOPRAZOLE SODIUM 40 MG PO TBEC
40.0000 mg | DELAYED_RELEASE_TABLET | Freq: Every day | ORAL | Status: DC
  Administered 2014-12-24: 40 mg via ORAL
  Filled 2014-12-24: qty 1

## 2014-12-24 MED ORDER — ENALAPRIL MALEATE 5 MG PO TABS
5.0000 mg | ORAL_TABLET | Freq: Every day | ORAL | Status: DC
  Administered 2014-12-24: 5 mg via ORAL
  Filled 2014-12-24: qty 1

## 2014-12-24 MED ORDER — CEFTRIAXONE SODIUM IN DEXTROSE 20 MG/ML IV SOLN
1.0000 g | INTRAVENOUS | Status: DC
  Administered 2014-12-24: 1 g via INTRAVENOUS
  Filled 2014-12-24 (×4): qty 50

## 2014-12-24 MED ORDER — INSULIN ASPART 100 UNIT/ML ~~LOC~~ SOLN
0.0000 [IU] | Freq: Three times a day (TID) | SUBCUTANEOUS | Status: DC
  Administered 2014-12-24 (×2): 2 [IU] via SUBCUTANEOUS

## 2014-12-24 MED ORDER — ENOXAPARIN SODIUM 40 MG/0.4ML ~~LOC~~ SOLN
40.0000 mg | SUBCUTANEOUS | Status: DC
  Administered 2014-12-24: 40 mg via SUBCUTANEOUS
  Filled 2014-12-24: qty 0.4

## 2014-12-24 MED ORDER — SPIRONOLACTONE 25 MG PO TABS
50.0000 mg | ORAL_TABLET | Freq: Every morning | ORAL | Status: DC
  Administered 2014-12-24: 50 mg via ORAL
  Filled 2014-12-24: qty 2

## 2014-12-24 MED ORDER — BUDESONIDE-FORMOTEROL FUMARATE 160-4.5 MCG/ACT IN AERO
2.0000 | INHALATION_SPRAY | Freq: Every day | RESPIRATORY_TRACT | Status: DC
  Filled 2014-12-24: qty 6

## 2014-12-24 MED ORDER — ONDANSETRON HCL 4 MG/2ML IJ SOLN: 4.0000 mg | Freq: Four times a day (QID) | INTRAMUSCULAR | Status: DC | PRN

## 2014-12-24 MED ORDER — PREGABALIN 50 MG PO CAPS
100.0000 mg | ORAL_CAPSULE | Freq: Two times a day (BID) | ORAL | Status: DC
  Administered 2014-12-24: 100 mg via ORAL
  Filled 2014-12-24: qty 2

## 2014-12-24 MED ORDER — INSULIN DETEMIR 100 UNIT/ML ~~LOC~~ SOLN
60.0000 [IU] | Freq: Every day | SUBCUTANEOUS | Status: DC
  Filled 2014-12-24 (×3): qty 0.6

## 2014-12-24 MED ORDER — FUROSEMIDE 20 MG PO TABS
20.0000 mg | ORAL_TABLET | Freq: Every morning | ORAL | Status: DC
  Administered 2014-12-24: 20 mg via ORAL
  Filled 2014-12-24: qty 1

## 2014-12-24 MED ORDER — RIFAXIMIN 550 MG PO TABS
550.0000 mg | ORAL_TABLET | Freq: Two times a day (BID) | ORAL | Status: DC
  Administered 2014-12-24: 550 mg via ORAL
  Filled 2014-12-24: qty 1

## 2014-12-24 MED ORDER — ASPIRIN EC 325 MG PO TBEC
325.0000 mg | DELAYED_RELEASE_TABLET | Freq: Every morning | ORAL | Status: DC
  Administered 2014-12-24: 325 mg via ORAL
  Filled 2014-12-24: qty 1

## 2014-12-24 MED ORDER — ALPRAZOLAM 0.5 MG PO TABS: 0.5000 mg | ORAL_TABLET | Freq: Three times a day (TID) | ORAL | Status: DC | PRN

## 2014-12-24 MED ORDER — SODIUM CHLORIDE 0.9 % IJ SOLN: 3.0000 mL | Freq: Two times a day (BID) | INTRAMUSCULAR | Status: DC

## 2014-12-24 NOTE — Clinical Social Work Psychosocial (Signed)
Clinical Social Work Department BRIEF PSYCHOSOCIAL ASSESSMENT 12/24/2014  Patient:  [name] [name], [name] [name]     Account Number:  [id number]     Admit date:  12/23/2014  Clinical Social Worker:  Wyatt Haste  Date/Time:  12/24/2014 09:16 AM  Referred by:  Physician  Date Referred:  12/24/2014 Referred for  SNF Placement   Other Referral:   Interview type:  Family Other interview type:   husband- [name] [name]    PSYCHOSOCIAL DATA Living Status:  HUSBAND Admitted from facility:   Level of care:   Primary support name:  [name] [name] Primary support relationship to patient:  SPOUSE Degree of support available:   supportive    CURRENT CONCERNS Current Concerns  Post-Acute Placement   Other Concerns:    SOCIAL WORK ASSESSMENT / PLAN CSW attempted to meet with pt, but pt sleeping. CSW spoke with pt's husband on phone. Pt requiring placement as husband is unable to manage her care at home. Pt was here last week with same situation. He absolutely refused to have pt placed outside of [facility]. No beds available at that time. Pt is Medicaid only as she used all of her Medicare days at SNF recently. Pt's husband reports she has had several falls. She has been getting to wheelchair, but has very limited mobility. Pt's husband appears to be overwhelmed by level of care. He very much wants her to be as close to home as possible. CSW informed him that this would most likely be outside of [facility] and he understands. He is very clear that it will ultimately be pt's decision. CSW acknowledged this as well and will discuss with her as well. Last hospital stay, pt deferred completely to husband and he is aware of this. He feels pt will refuse [facility] or [facility] because it is too far, but did agree again that he is unable to meet her needs at home. CSW will initiate bed search and follow up with pt and husband.   Assessment/plan status:  Psychosocial Support/Ongoing Assessment of Needs Other assessment/  plan:   Information/referral to community resources:   SNF list    PATIENT'S/FAMILY'S RESPONSE TO PLAN OF CARE: Pt sleeping at time of assessment. Husband does not want pt to be placed outside of [facility], but recognizes that they need to consider options right now.       [name], [place]

## 2014-12-24 NOTE — Progress Notes (Signed)
UR completed 

## 2014-12-24 NOTE — ED Notes (Signed)
[name], husband, left contact number : [phone] and daughter, Maudie Mercury, left contact number : [phone]

## 2014-12-24 NOTE — Clinical Social Work Placement (Signed)
Clinical Social Work Department CLINICAL SOCIAL WORK PLACEMENT NOTE 12/24/2014  Patient:  [name] [name], [name] [name]  Account Number:  [id number] Admit date:  12/23/2014  Clinical Social Worker:  [name], LCSW  Date/time:  12/24/2014 09:15 AM  Clinical Social Work is seeking post-discharge placement for this patient at the following level of care:   [facility]   (*CSW will update this form in Epic as items are completed)   12/24/2014  Patient/family provided with [facility] [place] Department of Clinical Social Work's list of facilities offering this level of care within the geographic area requested by the patient (or if unable, by the patient's family).  12/24/2014  Patient/family informed of their freedom to choose among providers that offer the needed level of care, that participate in Medicare, Medicaid or managed care program needed by the patient, have an available bed and are willing to accept the patient.  12/24/2014  Patient/family informed of [facility]' ownership interest in [facility], as well as of the fact that they are under no obligation to receive care at this facility.  PASARR submitted to EDS on  PASARR number received on   FL2 transmitted to all facilities in geographic area requested by pt/family on  12/24/2014 FL2 transmitted to all facilities within larger geographic area on 12/24/2014  Patient informed that his/her managed care company has contracts with or will negotiate with  certain facilities, including the following:     Patient/family informed of bed offers received:  12/24/2014 Patient chooses bed at [facility] Physician recommends and patient chooses bed at    Patient to be transferred to [facility] on  12/24/2014 Patient to be transferred to facility by [facility] Patient and family notified of transfer on 12/24/2014 Name of family member notified:  husband- [name]  The following physician request were entered in  Epic:   Additional Comments: Pt has existing pasarr.  [name], [place]

## 2014-12-24 NOTE — Discharge Summary (Signed)
Physician Discharge Summary  [name] [name] UXL:[id number] DOB: 1941/08/26 DOA: 12/23/2014  PCP: Purvis Kilts, MD  Admit date: 12/23/2014 Discharge date: 12/24/2014  Time spent: 65 minutes  Recommendations for Outpatient Follow-up:  1. Patient will be discharged to skilled nursing facility at [facility]. Follow-up with M.D. a skilled nursing facility.  Discharge Diagnoses:  Principal Problem:   Fall Active Problems:   GERD   Hepatic cirrhosis   Uncontrolled type 2 diabetes mellitus with peripheral neuropathy   Frequent falls   Generalized weakness   Liver cirrhosis secondary to NASH   Thrombocytopenia   Pancytopenia, acquired   Physical deconditioning   Morbid obesity   Well controlled type 2 diabetes mellitus   Discharge Condition: Stable and improved.  Diet recommendation: Low-salt diet.  Filed Weights   12/23/14 2136 12/24/14 0423  Weight: 105.235 kg (232 lb) 105.824 kg (233 lb 4.8 oz)    History of present illness:  74 year old female who  has a past medical history of CAD in native artery; Thrombocytopenia, unspecified; Unspecified hereditary and idiopathic peripheral neuropathy; Unspecified fall; Other and unspecified hyperlipidemia; Unspecified essential hypertension; Obesity, unspecified; Personal history of neurosis; Intrinsic asthma, unspecified; Allergic rhinitis, cause unspecified; Cirrhosis of liver; Colon adenomas (03/08/10); Hyperplastic polyps of stomach (03/08/10); Chronic gastritis (03/09/11); History of hemorrhoids (03/08/10); Diverticula of colon (03/08/10); Esophagitis, erosive (03/08/10); GERD (gastroesophageal reflux disease); Wears glasses; Type II or unspecified type diabetes mellitus without mention of complication, not stated as uncontrolled; Vertigo; Pancreatitis; Bilateral leg weakness; Thrombocytopenia (04/12/2014); Pancytopenia, acquired (04/12/2014); Cirrhosis; Abnormal EKG; and Chronic shoulder pain. Patient was brought to the hospital by ambulance after  patient had a fall at home. Patient usually is wheelchair bound but able to stand with help, on day of admission, patient was weak and she slipped while attempting to sit on the toilet. Patient's husband helped her to the floor she did not actually fall. Patient has been having poor appetite, with generalized weakness. Patient has a history of liver cirrhosis NASH. She takes lactulose and rifaximin. In the ED ammonia level was 62, with mildly abnormal UA. Patient denied any chest pain, no nausea vomiting or diarrhea. No shortness of breath. Denied passing out.  Hospital Course:  #1 fall/generalized weakness Patient had presented with fall and generalized weakness that was felt to be secondary to debility and poor oral intake. Chest x-ray which was done was negative for any acute infiltrate. Urinalysis done was nitrite negative trace leukocytes 7-10 WBCs. Patient remained afebrile. CBC obtained was within normal limits. Patient was placed empirically on IV Rocephin while urine cultures were pending. Patient remained afebrile throughout the hospitalization. Patient will be discharged to a skilled nursing facility in stable condition.  #2 bacteriuria versus UTI Urinalysis obtained on admission did have negative x-rays, trace leukocytes 7-10 WBCs. Patient was placed empirically on IV Rocephin. Urine cultures are pending. Urine cultures that need to be followed up as outpatient. Follow.  #3 history of liver cirrhosis/ NASH Remained stable throughout the hospitalization. Patient was maintained on a home regimen of Lasix, spironolactone, lactulose, Xifaxan.  #4 diabetes mellitus Remained stable. Patient was maintained on a home regimen of Lantus and sliding scale insulin.  The rest of patient's chronic medical issues remain stable throughout the hospital admission patient was discharged in stable and improved condition.  Procedures:  Xray L Knee 12/23/14   CXR  12/23/14  Consultations:  None  Discharge Exam: Filed Vitals:   12/24/14 0423  BP: 119/57  Pulse: 77  Temp: 98.5 F (36.9  C)  Resp: 10    General: NAD Cardiovascular: RRR Respiratory: CTAB  Discharge Instructions   Discharge Instructions    Diet - low sodium heart healthy    Complete by:  As directed      Discharge instructions    Complete by:  As directed   Follow up with MD at SNF.     Increase activity slowly    Complete by:  As directed           Current Discharge Medication List    CONTINUE these medications which have CHANGED   Details  OxyCODONE (OXYCONTIN) 10 mg T12A 12 hr tablet Take 1 tablet (10 mg total) by mouth 2 (two) times daily as needed (severe pain). Qty: 20 tablet, Refills: 0      CONTINUE these medications which have NOT CHANGED   Details  ALPRAZolam (XANAX) 0.5 MG tablet Take 1 tablet (0.5 mg total) by mouth 3 (three) times daily as needed for anxiety. Qty: 30 tablet, Refills: 0    aspirin EC 325 MG EC tablet Take 1 tablet (325 mg total) by mouth every morning. Qty: 30 tablet, Refills: 0    budesonide-formoterol (SYMBICORT) 160-4.5 MCG/ACT inhaler Inhale 2 puffs into the lungs at bedtime.     enalapril (VASOTEC) 5 MG tablet Take 1 tablet by mouth daily.    ESTRACE VAGINAL 0.1 MG/GM vaginal cream Place 1 Applicatorful vaginally every other day. At  bedtime    furosemide (LASIX) 20 MG tablet Take 1 tablet (20 mg total) by mouth every morning. Qty: 30 tablet, Refills: 5    insulin aspart (NOVOLOG FLEXPEN) 100 UNIT/ML FlexPen Inject 2-10 Units into the skin 3 (three) times daily with meals. Sliding Scale: 200-250= 2 units 251-300= 4 units 301-350= 6 units 351-400= 8 units If levels are over 400, GIVE 10 UNITS AND CALL MD    insulin detemir (LEVEMIR) 100 UNIT/ML injection Inject 60 Units into the skin at bedtime.     meclizine (ANTIVERT) 25 MG tablet Take 25 mg by mouth 3 (three) times daily as needed for dizziness or nausea.      Multiple Vitamins-Minerals (CENTRUM SILVER PO) Take 1 tablet by mouth daily.     NEXIUM 40 MG capsule Take 40 mg by mouth every morning.     ondansetron (ZOFRAN) 4 MG tablet Take 1 tablet (4 mg total) by mouth every 6 (six) hours as needed for nausea. Qty: 20 tablet, Refills: 0    pregabalin (LYRICA) 100 MG capsule Take 100 mg by mouth 2 (two) times daily.    quinapril (ACCUPRIL) 5 MG tablet Take 5 mg by mouth at bedtime.    spironolactone (ALDACTONE) 50 MG tablet Take 1 tablet (50 mg total) by mouth every morning. Qty: 30 tablet, Refills: 5    triamcinolone cream (KENALOG) 0.1 % Apply 1 application topically daily as needed (for irritation).     ursodiol (URSO FORTE) 500 MG tablet Take 500 mg by mouth 2 (two) times daily.    lactulose (CHRONULAC) 10 GM/15ML solution Take 60 mLs by mouth 2 (two) times daily. Refills: 0    rifaximin (XIFAXAN) 550 MG TABS tablet Take 1 tablet (550 mg total) by mouth 2 (two) times daily. Qty: 60 tablet, Refills: 1       Allergies  Allergen Reactions  . Morphine And Related Itching  . Compazine [Prochlorperazine Edisylate] Anxiety    Causes anxiety & nervousness   Follow-up Information    Please follow up.   Why:  f/u with  MD at SNF       The results of significant diagnostics from this hospitalization (including imaging, microbiology, ancillary and laboratory) are listed below for reference.    Significant Diagnostic Studies: Dg Chest 1 View  12/23/2014   CLINICAL DATA:  [place] from coli tonight. Weakness. Previous history of coronary disease, asthma, diabetes, heart catheterization and right shoulder injury.  EXAM: CHEST  1 VIEW  COMPARISON:  12/15/2014  FINDINGS: Postoperative changes in the right shoulder. Shallow inspiration. Normal heart size and pulmonary vascularity. No focal airspace disease or consolidation in the lungs. No blunting of costophrenic angles. No pneumothorax. Mediastinal contours appear intact.  IMPRESSION: No active  disease.   Electronically Signed   By: Lucienne Capers M.D.   On: 12/23/2014 22:33   Dg Chest 2 View  12/15/2014   CLINICAL DATA:  Acute onset of weakness and confusion for 2 days. Initial encounter.  EXAM: CHEST  2 VIEW  COMPARISON:  Chest radiograph performed 10/20/2014  FINDINGS: The lungs are well-aerated. Chronically increased interstitial markings are seen. There is no evidence of focal opacification, pleural effusion or pneumothorax.  The heart is borderline enlarged. No acute osseous abnormalities are seen.  IMPRESSION: Mild chronic lung changes noted. Borderline cardiomegaly. No focal airspace consolidation seen.   Electronically Signed   By: Garald Balding M.D.   On: 12/15/2014 21:27   Dg Knee Complete 4 Views Left  12/24/2014   CLINICAL DATA:  Left hip and left knee pain. Patient fell today while in the bathroom.  EXAM: LEFT KNEE - COMPLETE 4+ VIEW  COMPARISON:  12/04/2012  FINDINGS: Degenerative changes in the left knee with lateral greater than medial compartment narrowing and tricompartmental osteophytosis. No evidence of acute fracture or dislocation. No focal bone lesion or bone destruction. Bone cortex and trabecular architecture appear intact. No significant effusion. Vascular calcifications.  IMPRESSION: Tricompartment degenerative changes.  No acute bony abnormalities.   Electronically Signed   By: Lucienne Capers M.D.   On: 12/24/2014 00:13   Dg Hip Unilat With Pelvis 2-3 Views Left  12/24/2014   CLINICAL DATA:  Left hip in knee pain after a fall today well in the bathroom.  EXAM: LEFT HIP (WITH PELVIS) 2-3 VIEWS  COMPARISON:  None.  FINDINGS: Degenerative changes in the lower lumbar spine and hips. Pelvis and left hip appear intact. No evidence of acute fracture or dislocation. SI joints and symphysis pubis are not displaced. Soft tissues are unremarkable.  IMPRESSION: Degenerative changes in the lumbar spine and hips. No evidence of acute fracture or dislocation.   Electronically  Signed   By: Lucienne Capers M.D.   On: 12/24/2014 00:11    Microbiology: Recent Results (from the past 240 hour(s))  MRSA PCR Screening     Status: Abnormal   Collection Time: 12/16/14  7:15 AM  Result Value Ref Range Status   MRSA by PCR POSITIVE (A) NEGATIVE Final    Comment:        The GeneXpert MRSA Assay (FDA approved for NASAL specimens only), is one component of a comprehensive MRSA colonization surveillance program. It is not intended to diagnose MRSA infection nor to guide or monitor treatment for MRSA infections. RESULT CALLED TO, READ BACK BY AND VERIFIED WITH: TANDON,R. AT 1143 ON 12/16/2014 BY BAUGHAM,M.      Labs: Basic Metabolic Panel:  Recent Labs Lab 12/23/14 2217  NA 135  K 3.8  CL 104  CO2 25  GLUCOSE 188*  BUN 15  CREATININE 0.76  CALCIUM 8.9   Liver Function Tests:  Recent Labs Lab 12/23/14 2217  AST 21  ALT 16  ALKPHOS 105  BILITOT 1.6*  PROT 6.6  ALBUMIN 3.6    Recent Labs Lab 12/23/14 2217  LIPASE 37    Recent Labs Lab 12/23/14 2217  AMMONIA 62*   CBC:  Recent Labs Lab 12/23/14 2217  WBC 5.7  NEUTROABS 3.4  HGB 12.6  HCT 38.0  MCV 81.7  PLT 187   Cardiac Enzymes: No results for input(s): CKTOTAL, CKMB, CKMBINDEX, TROPONINI in the last 168 hours. BNP: BNP (last 3 results) No results for input(s): BNP in the last 8760 hours.  ProBNP (last 3 results) No results for input(s): PROBNP in the last 8760 hours.  CBG:  Recent Labs Lab 12/23/14 2208 12/24/14 0727 12/24/14 1140  GLUCAP 188* 178* 172*       Signed:  Garrin Kirwan MD [facility] 12/24/2014, 1:11 PM

## 2014-12-24 NOTE — Progress Notes (Signed)
I have seen and assessed patient and agree with Dr Toney Sang assessment and plan. SW for placement.

## 2014-12-24 NOTE — H&P (Signed)
PCP:   Purvis Kilts, MD   Chief Complaint:  Fall  HPI: 74 year old female who   has a past medical history of CAD in native artery; Thrombocytopenia, unspecified; Unspecified hereditary and idiopathic peripheral neuropathy; Unspecified fall; Other and unspecified hyperlipidemia; Unspecified essential hypertension; Obesity, unspecified; Personal history of neurosis; Intrinsic asthma, unspecified; Allergic rhinitis, cause unspecified; Cirrhosis of liver; Colon adenomas (03/08/10); Hyperplastic polyps of stomach (03/08/10); Chronic gastritis (03/09/11); History of hemorrhoids (03/08/10); Diverticula of colon (03/08/10); Esophagitis, erosive (03/08/10); GERD (gastroesophageal reflux disease); Wears glasses; Type II or unspecified type diabetes mellitus without mention of complication, not stated as uncontrolled; Vertigo; Pancreatitis; Bilateral leg weakness; Thrombocytopenia (04/12/2014); Pancytopenia, acquired (04/12/2014); Cirrhosis; Abnormal EKG; and Chronic shoulder pain. Today was brought to the hospital by ambulance after patient had a fall at home. Patient usually is wheelchair bound but able to stand with help, today patient was weak and she slipped while attempting to sit on the toilet. Patient's husband helped her to the floor she did not actually fall. Patient has been having poor appetite, with generalized weakness. Patient has a history of liver cirrhosis NASH. She takes lactulose and rifaximin. In the ED ammonia level was 62, with mildly abnormal UA. Patient denies chest pain no nausea vomiting or diarrhea. No shortness of breath. Denies passing out.  Allergies:   Allergies  Allergen Reactions  . Morphine And Related Itching  . Compazine [Prochlorperazine Edisylate] Anxiety    Causes anxiety & nervousness      Past Medical History  Diagnosis Date  . CAD in native artery   . Thrombocytopenia, unspecified   . Unspecified hereditary and idiopathic peripheral neuropathy   .  Unspecified fall   . Other and unspecified hyperlipidemia   . Unspecified essential hypertension   . Obesity, unspecified   . Personal history of neurosis   . Intrinsic asthma, unspecified   . Allergic rhinitis, cause unspecified   . Cirrhosis of liver     NASH, afp on 06/17/12 =3.7, per pt she had hep B vaccines in 1996, hep A in process.   . Colon adenomas 03/08/10    tcs by Dr. Gala Romney  . Hyperplastic polyps of stomach 03/08/10    tcs by Dr. Dudley Major  . Chronic gastritis 03/09/11    egd by Dr. Gala Romney  . History of hemorrhoids 03/08/10    tcs- internal and external  . Diverticula of colon 03/08/10    L side  . Esophagitis, erosive 03/08/10  . GERD (gastroesophageal reflux disease)   . Wears glasses   . Type II or unspecified type diabetes mellitus without mention of complication, not stated as uncontrolled   . Vertigo   . Pancreatitis   . Bilateral leg weakness   . Thrombocytopenia 04/12/2014  . Pancytopenia, acquired 04/12/2014  . Cirrhosis   . Abnormal EKG     hx of left anterior fasicular block on 04-09-13 ekg epic  . Chronic shoulder pain     Past Surgical History  Procedure Laterality Date  . Hysterectomy and btl      s/p  . Tumor excision  2003    rt arm and left foot  . Colonoscopy  03/08/10    Dr. [name]-->ext/int hemorrhoids, anal paipilla, rectal polyps, desc polyps, cecal polyp, left-sided diverticula. suboptiomal prep. next TCS 02/2015. multiple adenomas  . Esophagogastroduodenoscopy  03/08/10    Dr. [name] erosive RE, small hh, antral erosions, small 1cm are of mucosal indentation along gastric body of doubtful significance, cystic nodularity of hypophyarynx, base  of tongue, base of epiglottis. benign gastric biopsies  . Tubal ligation    . Esophagogastroduodenoscopy  10/08/2011    Dr. [name] esophageal varices, antral erosion. next egd 09/2013  . Foot surgery  2003    lt foot  . Eye surgery      cataracts bilateral  . Orif humerus fracture Right 02/17/2013     Procedure: OPEN REDUCTION INTERNAL FIXATION (ORIF) RIGHT PROXIMAL HUMERUS FRACTURE;  Surgeon: [name], MD;  Location: [facility];  Service: Orthopedics;  Laterality: Right;  . Partial gastrectomy      family denies  . Esophagogastroduodenoscopy N/A 02/17/2014    Dr. Gala Romney: portal gastropathy, no varices. Due for surveillance 2017  . Breast surgery Right few yrs ago    benign area removed  . Abdominal hysterectomy    . Cardiac catheterization  02/25/2007    Dr. [name]:  normal LV systolic function, mild irregularities of LAD    Prior to Admission medications   Medication Sig Start Date End Date Taking? Authorizing Provider  ALPRAZolam Duanne Moron) 0.5 MG tablet Take 1 tablet (0.5 mg total) by mouth 3 (three) times daily as needed for anxiety. 08/19/14  Yes Erline Hau, MD  aspirin EC 325 MG EC tablet Take 1 tablet (325 mg total) by mouth every morning. 07/14/14  Yes Nita Sells, MD  budesonide-formoterol (SYMBICORT) 160-4.5 MCG/ACT inhaler Inhale 2 puffs into the lungs at bedtime.    Yes Historical Provider, MD  enalapril (VASOTEC) 5 MG tablet Take 1 tablet by mouth daily. 08/25/14  Yes Historical Provider, MD  ESTRACE VAGINAL 0.1 MG/GM vaginal cream Place 1 Applicatorful vaginally every other day. At  bedtime 08/11/13  Yes Historical Provider, MD  furosemide (LASIX) 20 MG tablet Take 1 tablet (20 mg total) by mouth every morning. 09/03/14  Yes Mahala Menghini, PA-C  insulin aspart (NOVOLOG FLEXPEN) 100 UNIT/ML FlexPen Inject 2-10 Units into the skin 3 (three) times daily with meals. Sliding Scale: 200-250= 2 units 251-300= 4 units 301-350= 6 units 351-400= 8 units If levels are over 400, GIVE 10 UNITS AND CALL MD   Yes Historical Provider, MD  insulin detemir (LEVEMIR) 100 UNIT/ML injection Inject 60 Units into the skin at bedtime.    Yes Historical Provider, MD  meclizine (ANTIVERT) 25 MG tablet Take 25 mg by mouth 3 (three) times daily as needed for dizziness or nausea.     Yes Historical Provider, MD  Multiple Vitamins-Minerals (CENTRUM SILVER PO) Take 1 tablet by mouth daily.    Yes Historical Provider, MD  NEXIUM 40 MG capsule Take 40 mg by mouth every morning.  06/17/14  Yes Historical Provider, MD  ondansetron (ZOFRAN) 4 MG tablet Take 1 tablet (4 mg total) by mouth every 6 (six) hours as needed for nausea. 08/06/14  Yes Radene Gunning, NP  OxyCODONE (OXYCONTIN) 10 mg T12A 12 hr tablet Take 1 tablet (10 mg total) by mouth 2 (two) times daily as needed (severe pain). 08/19/14  Yes Erline Hau, MD  pregabalin (LYRICA) 100 MG capsule Take 100 mg by mouth 2 (two) times daily.   Yes Historical Provider, MD  quinapril (ACCUPRIL) 5 MG tablet Take 5 mg by mouth at bedtime.   Yes Historical Provider, MD  spironolactone (ALDACTONE) 50 MG tablet Take 1 tablet (50 mg total) by mouth every morning. 10/19/14  Yes Mahala Menghini, PA-C  triamcinolone cream (KENALOG) 0.1 % Apply 1 application topically daily as needed (for irritation).  06/26/13  Yes Historical  Provider, MD  ursodiol (URSO FORTE) 500 MG tablet Take 500 mg by mouth 2 (two) times daily.   Yes Historical Provider, MD  lactulose (CHRONULAC) 10 GM/15ML solution Take 60 mLs by mouth 2 (two) times daily. 10/20/14   Historical Provider, MD  lactulose, encephalopathy, (GENERLAC) 10 GM/15ML SOLN Take 45 mLs (30 g total) by mouth 3 (three) times daily. Patient not taking: Reported on 12/23/2014 06/28/14   Kathie Dike, MD  rifaximin (XIFAXAN) 550 MG TABS tablet Take 1 tablet (550 mg total) by mouth 2 (two) times daily. Patient not taking: Reported on 12/23/2014 06/28/14   Kathie Dike, MD    Social History:  reports that she has never smoked. She has never used smokeless tobacco. She reports that she does not drink alcohol or use illicit drugs.  Family History  Problem Relation Age of Onset  . Coronary artery disease      FH  . Diabetes      FH  . Heart disease Mother   . Colon cancer Neg Hx      All the  positives are listed in BOLD  Review of Systems:  HEENT: Headache, blurred vision, runny nose, sore throat Neck: Hypothyroidism, hyperthyroidism,,lymphadenopathy Chest : Shortness of breath, history of COPD, Asthma Heart : Chest pain, history of coronary arterey disease GI:  Nausea, vomiting, diarrhea, constipation, GERD GU: Dysuria, urgency, frequency of urination, hematuria Neuro: Stroke, seizures, syncope Psych: Depression, anxiety, hallucinations   Physical Exam: Blood pressure 131/65, pulse 78, temperature 97.8 F (36.6 C), temperature source Oral, resp. rate 18, height 5\' 8"  (1.727 m), weight 105.235 kg (232 lb), SpO2 98 %. Constitutional:   Patient is a well-developed and well-nourished female* in no acute distress and cooperative with exam. Head: Normocephalic and atraumatic Mouth: Mucus membranes moist Eyes: PERRL, EOMI, conjunctivae normal Neck: Supple, No Thyromegaly Cardiovascular: RRR, S1 normal, S2 normal Pulmonary/Chest: CTAB, no wheezes, rales, or rhonchi Abdominal: Soft. Non-tender, non-distended, bowel sounds are normal, no masses, organomegaly, or guarding present.  Neurological: A&O x3, Strength is normal and symmetric bilaterally, cranial nerve II-XII are grossly intact, no focal motor deficit, sensory intact to light touch bilaterally.  Extremities : No Cyanosis, Clubbing. Positive trace edema bilaterally of the lower extremities.  Labs on Admission:  Basic Metabolic Panel:  Recent Labs Lab 12/23/14 2217  NA 135  K 3.8  CL 104  CO2 25  GLUCOSE 188*  BUN 15  CREATININE 0.76  CALCIUM 8.9   Liver Function Tests:  Recent Labs Lab 12/23/14 2217  AST 21  ALT 16  ALKPHOS 105  BILITOT 1.6*  PROT 6.6  ALBUMIN 3.6    Recent Labs Lab 12/23/14 2217  LIPASE 37    Recent Labs Lab 12/23/14 2217  AMMONIA 62*   CBC:  Recent Labs Lab 12/23/14 2217  WBC 5.7  NEUTROABS 3.4  HGB 12.6  HCT 38.0  MCV 81.7  PLT 187   Cardiac Enzymes: No  results for input(s): CKTOTAL, CKMB, CKMBINDEX, TROPONINI in the last 168 hours.  BNP (last 3 results) No results for input(s): BNP in the last 8760 hours.  ProBNP (last 3 results) No results for input(s): PROBNP in the last 8760 hours.  CBG:  Recent Labs Lab 12/23/14 2208  GLUCAP 188*    Radiological Exams on Admission: Dg Chest 1 View  12/23/2014   CLINICAL DATA:  [place] from coli tonight. Weakness. Previous history of coronary disease, asthma, diabetes, heart catheterization and right shoulder injury.  EXAM: CHEST  1 VIEW  COMPARISON:  12/15/2014  FINDINGS: Postoperative changes in the right shoulder. Shallow inspiration. Normal heart size and pulmonary vascularity. No focal airspace disease or consolidation in the lungs. No blunting of costophrenic angles. No pneumothorax. Mediastinal contours appear intact.  IMPRESSION: No active disease.   Electronically Signed   By: Lucienne Capers M.D.   On: 12/23/2014 22:33   Dg Knee Complete 4 Views Left  12/24/2014   CLINICAL DATA:  Left hip and left knee pain. Patient fell today while in the bathroom.  EXAM: LEFT KNEE - COMPLETE 4+ VIEW  COMPARISON:  12/04/2012  FINDINGS: Degenerative changes in the left knee with lateral greater than medial compartment narrowing and tricompartmental osteophytosis. No evidence of acute fracture or dislocation. No focal bone lesion or bone destruction. Bone cortex and trabecular architecture appear intact. No significant effusion. Vascular calcifications.  IMPRESSION: Tricompartment degenerative changes.  No acute bony abnormalities.   Electronically Signed   By: Lucienne Capers M.D.   On: 12/24/2014 00:13   Dg Hip Unilat With Pelvis 2-3 Views Left  12/24/2014   CLINICAL DATA:  Left hip in knee pain after a fall today well in the bathroom.  EXAM: LEFT HIP (WITH PELVIS) 2-3 VIEWS  COMPARISON:  None.  FINDINGS: Degenerative changes in the lower lumbar spine and hips. Pelvis and left hip appear intact. No evidence of  acute fracture or dislocation. SI joints and symphysis pubis are not displaced. Soft tissues are unremarkable.  IMPRESSION: Degenerative changes in the lumbar spine and hips. No evidence of acute fracture or dislocation.   Electronically Signed   By: Lucienne Capers M.D.   On: 12/24/2014 00:11    EKG: Independently reviewed. Sinus rhythm   Assessment/Plan Active Problems:   Fall  UTI   Fall/generalized weakness Likely from deconditioning and poor by mouth intake We'll admit the patient under observation, obtain physical therapy consultation. Patient also has mildly abnormal UA, will start IV Rocephin and follow the urine culture results.  UTI Mildly abnormal UA, will start Rocephin as above and follow the urine culture results.  Liver cirrhosis/[place] Continue Lasix, Spironolactone, lactulose, rifaximin  Diabetes mellitus Initiate sliding scale insulin. Lantus 60 units at bedtime.  DVT prophylaxis Lovenox  Code status: Full code  Family discussion: Admission, patients condition and plan of care including tests being ordered have been discussed with the patient and *her husband and daughter at bedside who indicate understanding and agree with the plan and Code Status.   Time Spent on Admission: 55 minutes  [facility] Pager: [phone] 12/24/2014, 2:06 AM  If 7PM-7AM, please contact night-coverage  [email or web address]  Password [email or web address]

## 2014-12-24 NOTE — Clinical Social Work Placement (Signed)
Clinical Social Work Department CLINICAL SOCIAL WORK PLACEMENT NOTE 12/24/2014  Patient:  [name] [name], [name] [name]  Account Number:  [id number] Admit date:  12/23/2014  Clinical Social Worker:  [name], LCSW  Date/time:  12/24/2014 09:15 AM  Clinical Social Work is seeking post-discharge placement for this patient at the following level of care:   [place]   (*CSW will update this form in Epic as items are completed)   12/24/2014  Patient/family provided with [facility] Department of Clinical Social Work's list of facilities offering this level of care within the geographic area requested by the patient (or if unable, by the patient's family).  12/24/2014  Patient/family informed of their freedom to choose among providers that offer the needed level of care, that participate in Medicare, Medicaid or managed care program needed by the patient, have an available bed and are willing to accept the patient.  12/24/2014  Patient/family informed of [facility]' ownership interest in [facility], as well as of the fact that they are under no obligation to receive care at this facility.  PASARR submitted to EDS on  PASARR number received on   FL2 transmitted to all facilities in geographic area requested by pt/family on  12/24/2014 FL2 transmitted to all facilities within larger geographic area on 12/24/2014  Patient informed that his/her managed care company has contracts with or will negotiate with  certain facilities, including the following:     Patient/family informed of bed offers received:   Patient chooses bed at  Physician recommends and patient chooses bed at    Patient to be transferred to  on   Patient to be transferred to facility by  Patient and family notified of transfer on  Name of family member notified:    The following physician request were entered in Epic:   Additional Comments: Pt has existing pasarr.  [name], [place]

## 2014-12-24 NOTE — Progress Notes (Signed)
Pt discharged home today per Dr. Grandville Silos. Pt's IV site D/C'd and WDL Pt's VSS. FL2 packet given to husband with instructions to give to caregiver at [facility]. Attempted to call report to nurse at [facility]. After waiting for approximately 8 minutes on hold, RN unable to give report. Pt left floor via WC accompanied NT, husband and [facility] transportation.

## 2014-12-24 NOTE — ED Provider Notes (Signed)
Patient is a 74 year old female with history of NASH. She was recently hospitalized for an elevated ammonia level and generalized malaise. She required admission to an extended care facility for rehabilitation. She returns today with increased weakness and difficulty with ambulation at home. She has also had decreased appetite and increased swelling to her legs.  Care was signed out to me at shift change awaiting results of x-rays and urinalysis. All of these returned essentially unremarkable. I discussed the results of the testing with the family and they are uncomfortable with her returning home. They do not feel as though they can handle her and believe she is at serious risk for falls. Patient does appear chronically ill and significantly debilitated. I do not feel it is safe for her to return to her current environment. I've spoken with Dr. Darrick Meigs from the hospitalist service who agrees to admit for observation. She will likely require nursing home placement for further rehabilitation.  Veryl Speak, MD 12/24/14 [phone]

## 2014-12-24 NOTE — Clinical Social Work Note (Signed)
CSW received call from several facilities stating pt has days left on Medicare. CSW spoke with [facility] who called again and was now told she has 17 days. [facility] is willing to accept pt. Pt and husband notified and very relieved. MD to d/c pt today. Pt's husband is contact [facility] for transportation.   [name], [place]

## 2014-12-24 NOTE — Evaluation (Addendum)
Physical Therapy Evaluation Patient Details Name: [name] [name] MRN: [id number] DOB: 03-Jun-1941 Today's Date: 12/24/2014   History of Present Illness  Today was brought to the hospital by ambulance after patient had a fall at home. Patient usually is wheelchair bound but able to stand with help, today patient was weak and she slipped while attempting to sit on the toilet. Patient's husband helped her to the floor she did not actually fall. Patient has been having poor appetite, with generalized weakness. Patient has a history of liver cirrhosis NASH. She takes lactulose and rifaximin. In the ED ammonia level was 62, with mildly abnormal UA.. Paatient fell during transfer from wheel chair to camode. while husband and daughter were helping patient. Patient states desire to go home.   Clinical Impression  Patient required assistance with all bed mobility and unable to perform sit to stand due to max  assistance required and bilateral LE weakness., and patient denying therapy to help assist standing.  Recommending SNF placement to increase strength so patient can increase her strength and independence so patient will require decreased assistance from husband upon return to home.     Follow Up Recommendations SNF    Equipment Recommendations  None recommended by PT    Recommendations for Other Services       Precautions / Restrictions Precautions Precautions: Fall      Mobility  Bed Mobility Overal bed mobility: Needs Assistance Bed Mobility: Supine to Sit;Sit to Supine     Supine to sit: Mod assist Sit to supine: Mod assist   General bed mobility comments: Pt required increased time and VC to complete task.  Noted increased lateral lean to the Rt, requiring continual VC for upright sitting. patient required accasional Mod assist to maintain sitting as well.   Transfers                 General transfer comment: Not performed due to unsteadiness and weakness. Patient denies  standing tdue to fear of falling despite max verbal cuing. Patient also displays severe weakness in LE resulting in requireing of at least mosd assist of 2, max assist of 1 to attept standing.   Ambulation/Gait                Stairs            Wheelchair Mobility    Modified Rankin (Stroke Patients Only)       Balance Overall balance assessment: History of Falls;Needs assistance Sitting-balance support: Bilateral upper extremity supported;Feet supported Sitting balance-Leahy Scale: Poor Sitting balance - Comments: Poor sitting balance, with lateral lean to the Rt.  Continual VC, and close min to moderate assistance for upright sitting.   Standing balance support:  (not assessed. )                                 Pertinent Vitals/Pain Pain Assessment: No/denies pain Pain Score: 0-No pain    Home Living Family/patient expects to be discharged to:: Private residence Living Arrangements: Spouse/significant other Available Help at Discharge: Family;Available 24 hours/day Type of Home: House Home Access: Ramped entrance Entrance Stairs-Rails: Can reach both Entrance Stairs-Number of Steps: 3-4 Home Layout: One level Home Equipment: Walker - 2 wheels;Cane - single point;Bedside commode;Wheelchair - Press photographer;Shower seat Additional Comments: Tub shower    Prior Function Level of Independence: Needs assistance   Gait / Transfers Assistance Needed: Per pt her husband has been helping  her with bed mobility and transfers.  Limited ambulation reported, with pt spending most of her time in the bed or W/C.  Pt able to self propel W/C with her feet. Patient has 2 prior falls in the last 6 months.   ADL's / Homemaking Assistance Needed: Pt reports husband assists with all IADLs and occassional assist with ADLs  Comments: TV, puzzles, go out with grandchildren/family, going shopping      Hand Dominance   Dominant Hand: Right    Extremity/Trunk  Assessment               Lower Extremity Assessment: Generalized weakness      Cervical / Trunk Assessment: Normal  Communication   Communication: No difficulties  Cognition Arousal/Alertness: Awake/alert Behavior During Therapy: WFL for tasks assessed/performed Overall Cognitive Status: Within Functional Limits for tasks assessed                      General Comments      Exercises Total Joint Exercises Ankle Circles/Pumps: AROM;20 reps;Both Heel Slides: AROM;Both;10 reps Hip ABduction/ADduction: AROM;Both;10 reps Bridges: AROM;10 reps;Both      Assessment/Plan    PT Assessment Patient needs continued PT services  PT Diagnosis Difficulty walking;Generalized weakness   PT Problem List Decreased strength;Decreased activity tolerance;Decreased balance;Decreased mobility;Obesity;Decreased safety awareness  PT Treatment Interventions Gait training;Functional mobility training;Therapeutic exercise;Balance training;Patient/family education   PT Goals (Current goals can be found in the Care Plan section) Acute Rehab PT Goals Patient Stated Goal: to be bale to stand with min assist.  PT Goal Formulation: With patient Time For Goal Achievement: 12/31/14 Potential to Achieve Goals: Fair    Frequency Min 3X/week   Barriers to discharge Decreased caregiver support      Co-evaluation               End of Session Equipment Utilized During Treatment: Gait belt Activity Tolerance: Patient limited by fatigue Patient left: in bed;with call bell/phone within reach;with bed alarm set           Time: 1110-1132 PT Time Calculation (min) (ACUTE ONLY): 22 min   Charges:   PT Evaluation $Initial PT Evaluation Tier I: 1 Procedure PT Treatments $Therapeutic Exercise: 8-22 mins   PT G Codes:            G-Codes - 01/24/15 0825    Functional Assessment Tool Used Clinical judgement   Mobility: Walking and Moving Around Current Status (Z6109) At least 80  percent but less than 100 percent impaired, limited or restricted   Mobility: Walking and Moving Around Goal Status (U0454) At least 60 percent but less than 80 percent impaired, limited or restricted         Ameir Faria R 12/24/2014, 11:32 AM  *

## 2014-12-25 DIAGNOSIS — E119 Type 2 diabetes mellitus without complications: Secondary | ICD-10-CM | POA: Diagnosis not present

## 2014-12-25 DIAGNOSIS — S91109A Unspecified open wound of unspecified toe(s) without damage to nail, initial encounter: Secondary | ICD-10-CM | POA: Diagnosis not present

## 2014-12-25 LAB — HEMOGLOBIN A1C
Hgb A1c MFr Bld: 7 % — ABNORMAL HIGH (ref 4.8–5.6)
Mean Plasma Glucose: 154 mg/dL

## 2014-12-27 DIAGNOSIS — E0841 Diabetes mellitus due to underlying condition with diabetic mononeuropathy: Secondary | ICD-10-CM | POA: Diagnosis not present

## 2014-12-27 DIAGNOSIS — H109 Unspecified conjunctivitis: Secondary | ICD-10-CM | POA: Diagnosis not present

## 2014-12-27 DIAGNOSIS — K219 Gastro-esophageal reflux disease without esophagitis: Secondary | ICD-10-CM | POA: Diagnosis not present

## 2014-12-27 LAB — URINE CULTURE

## 2014-12-28 DIAGNOSIS — E119 Type 2 diabetes mellitus without complications: Secondary | ICD-10-CM | POA: Diagnosis not present

## 2014-12-28 DIAGNOSIS — K7290 Hepatic failure, unspecified without coma: Secondary | ICD-10-CM | POA: Diagnosis not present

## 2014-12-28 DIAGNOSIS — H109 Unspecified conjunctivitis: Secondary | ICD-10-CM | POA: Diagnosis not present

## 2014-12-28 NOTE — Care Management Note (Signed)
    Page 1 of 1   12/28/2014     4:56:05 PM CARE MANAGEMENT NOTE 12/28/2014  Patient:  [name] [name], [name] [name]   Account Number:  [id number]  Date Initiated:  12/24/2014  Documentation initiated by:  Vladimir Creeks  Subjective/Objective Assessment:   Pt admitted from home with spouse after a fall. Spouse states he is having difficulty caring for the pt, and would like her to go to SNF, but nees for her to be in [place], because he has a difficult time driving out of town. He likes to     Action/Plan:   go see her every day to make sure she is well cared for. Spouse is very attentive to pt and feeds her most meals. She has difficulty with meals and  spills and drops her food and drink, frequently due to  neuropathy?, shakey hands - CSW   Anticipated DC Date:  12/24/2014   Anticipated DC Plan:  SKILLED NURSING FACILITY  In-house referral  Clinical Social Worker      DC Planning Services  CM consult      Choice offered to / List presented to:             Status of service:  Completed, signed off Medicare Important Message given?   (If response is "NO", the following Medicare IM given date fields will be blank) Date Medicare IM given:   Medicare IM given by:   Date Additional Medicare IM given:   Additional Medicare IM given by:    Discharge Disposition:  [place]  Per UR Regulation:  Reviewed for med. necessity/level of care/duration of stay  If discussed at [place] of Stay Meetings, dates discussed:    Comments:  12/24/14 1500 Vladimir Creeks RN/CM pt D/C to [facility] 12/23/14 1100 Rovena Hearld RN/CM CSW working with pt and spouse to find a SNF bed. They would like [facility]

## 2014-12-29 DIAGNOSIS — M25511 Pain in right shoulder: Secondary | ICD-10-CM | POA: Diagnosis not present

## 2014-12-29 DIAGNOSIS — H109 Unspecified conjunctivitis: Secondary | ICD-10-CM | POA: Diagnosis not present

## 2014-12-29 DIAGNOSIS — K7290 Hepatic failure, unspecified without coma: Secondary | ICD-10-CM | POA: Diagnosis not present

## 2014-12-29 DIAGNOSIS — G4700 Insomnia, unspecified: Secondary | ICD-10-CM | POA: Diagnosis not present

## 2014-12-29 DIAGNOSIS — L03039 Cellulitis of unspecified toe: Secondary | ICD-10-CM | POA: Diagnosis not present

## 2015-01-01 DIAGNOSIS — L03039 Cellulitis of unspecified toe: Secondary | ICD-10-CM | POA: Diagnosis not present

## 2015-01-01 DIAGNOSIS — M25511 Pain in right shoulder: Secondary | ICD-10-CM | POA: Diagnosis not present

## 2015-01-01 DIAGNOSIS — G4700 Insomnia, unspecified: Secondary | ICD-10-CM | POA: Diagnosis not present

## 2015-01-08 DIAGNOSIS — K7469 Other cirrhosis of liver: Secondary | ICD-10-CM | POA: Diagnosis not present

## 2015-01-08 DIAGNOSIS — K7290 Hepatic failure, unspecified without coma: Secondary | ICD-10-CM | POA: Diagnosis not present

## 2015-01-08 DIAGNOSIS — R1011 Right upper quadrant pain: Secondary | ICD-10-CM | POA: Diagnosis not present

## 2015-01-09 DIAGNOSIS — R1011 Right upper quadrant pain: Secondary | ICD-10-CM | POA: Diagnosis not present

## 2015-01-09 DIAGNOSIS — K7290 Hepatic failure, unspecified without coma: Secondary | ICD-10-CM | POA: Diagnosis not present

## 2015-01-09 DIAGNOSIS — K7469 Other cirrhosis of liver: Secondary | ICD-10-CM | POA: Diagnosis not present

## 2015-01-10 DIAGNOSIS — R2681 Unsteadiness on feet: Secondary | ICD-10-CM | POA: Diagnosis not present

## 2015-01-10 DIAGNOSIS — Z9181 History of falling: Secondary | ICD-10-CM | POA: Diagnosis not present

## 2015-01-10 DIAGNOSIS — R296 Repeated falls: Secondary | ICD-10-CM | POA: Diagnosis not present

## 2015-01-10 DIAGNOSIS — R1011 Right upper quadrant pain: Secondary | ICD-10-CM | POA: Diagnosis not present

## 2015-01-10 DIAGNOSIS — R05 Cough: Secondary | ICD-10-CM | POA: Diagnosis not present

## 2015-01-10 DIAGNOSIS — K7290 Hepatic failure, unspecified without coma: Secondary | ICD-10-CM | POA: Diagnosis not present

## 2015-01-10 DIAGNOSIS — K2950 Unspecified chronic gastritis without bleeding: Secondary | ICD-10-CM | POA: Diagnosis not present

## 2015-01-10 DIAGNOSIS — Z79899 Other long term (current) drug therapy: Secondary | ICD-10-CM | POA: Diagnosis not present

## 2015-01-10 DIAGNOSIS — K7469 Other cirrhosis of liver: Secondary | ICD-10-CM | POA: Diagnosis not present

## 2015-01-10 DIAGNOSIS — R41841 Cognitive communication deficit: Secondary | ICD-10-CM | POA: Diagnosis not present

## 2015-01-10 DIAGNOSIS — M6281 Muscle weakness (generalized): Secondary | ICD-10-CM | POA: Diagnosis not present

## 2015-01-11 DIAGNOSIS — I517 Cardiomegaly: Secondary | ICD-10-CM | POA: Diagnosis not present

## 2015-01-11 DIAGNOSIS — Z9181 History of falling: Secondary | ICD-10-CM | POA: Diagnosis not present

## 2015-01-11 DIAGNOSIS — R2681 Unsteadiness on feet: Secondary | ICD-10-CM | POA: Diagnosis not present

## 2015-01-11 DIAGNOSIS — R296 Repeated falls: Secondary | ICD-10-CM | POA: Diagnosis not present

## 2015-01-11 DIAGNOSIS — M6281 Muscle weakness (generalized): Secondary | ICD-10-CM | POA: Diagnosis not present

## 2015-01-11 DIAGNOSIS — Z79899 Other long term (current) drug therapy: Secondary | ICD-10-CM | POA: Diagnosis not present

## 2015-01-11 DIAGNOSIS — K7290 Hepatic failure, unspecified without coma: Secondary | ICD-10-CM | POA: Diagnosis not present

## 2015-01-11 DIAGNOSIS — R41841 Cognitive communication deficit: Secondary | ICD-10-CM | POA: Diagnosis not present

## 2015-01-11 DIAGNOSIS — R05 Cough: Secondary | ICD-10-CM | POA: Diagnosis not present

## 2015-01-12 DIAGNOSIS — R41841 Cognitive communication deficit: Secondary | ICD-10-CM | POA: Diagnosis not present

## 2015-01-12 DIAGNOSIS — K7469 Other cirrhosis of liver: Secondary | ICD-10-CM | POA: Diagnosis not present

## 2015-01-12 DIAGNOSIS — R05 Cough: Secondary | ICD-10-CM | POA: Diagnosis not present

## 2015-01-12 DIAGNOSIS — R296 Repeated falls: Secondary | ICD-10-CM | POA: Diagnosis not present

## 2015-01-12 DIAGNOSIS — Z9181 History of falling: Secondary | ICD-10-CM | POA: Diagnosis not present

## 2015-01-12 DIAGNOSIS — R2681 Unsteadiness on feet: Secondary | ICD-10-CM | POA: Diagnosis not present

## 2015-01-12 DIAGNOSIS — K7290 Hepatic failure, unspecified without coma: Secondary | ICD-10-CM | POA: Diagnosis not present

## 2015-01-12 DIAGNOSIS — R1011 Right upper quadrant pain: Secondary | ICD-10-CM | POA: Diagnosis not present

## 2015-01-12 DIAGNOSIS — M6281 Muscle weakness (generalized): Secondary | ICD-10-CM | POA: Diagnosis not present

## 2015-01-13 DIAGNOSIS — K7290 Hepatic failure, unspecified without coma: Secondary | ICD-10-CM | POA: Diagnosis not present

## 2015-01-13 DIAGNOSIS — R2681 Unsteadiness on feet: Secondary | ICD-10-CM | POA: Diagnosis not present

## 2015-01-13 DIAGNOSIS — M6281 Muscle weakness (generalized): Secondary | ICD-10-CM | POA: Diagnosis not present

## 2015-01-13 DIAGNOSIS — R41841 Cognitive communication deficit: Secondary | ICD-10-CM | POA: Diagnosis not present

## 2015-01-13 DIAGNOSIS — D649 Anemia, unspecified: Secondary | ICD-10-CM | POA: Diagnosis not present

## 2015-01-13 DIAGNOSIS — R1011 Right upper quadrant pain: Secondary | ICD-10-CM | POA: Diagnosis not present

## 2015-01-13 DIAGNOSIS — R296 Repeated falls: Secondary | ICD-10-CM | POA: Diagnosis not present

## 2015-01-13 DIAGNOSIS — Z9181 History of falling: Secondary | ICD-10-CM | POA: Diagnosis not present

## 2015-01-13 DIAGNOSIS — R531 Weakness: Secondary | ICD-10-CM | POA: Diagnosis not present

## 2015-01-13 DIAGNOSIS — R05 Cough: Secondary | ICD-10-CM | POA: Diagnosis not present

## 2015-01-13 DIAGNOSIS — K7469 Other cirrhosis of liver: Secondary | ICD-10-CM | POA: Diagnosis not present

## 2015-01-14 DIAGNOSIS — R05 Cough: Secondary | ICD-10-CM | POA: Diagnosis not present

## 2015-01-14 DIAGNOSIS — R1011 Right upper quadrant pain: Secondary | ICD-10-CM | POA: Diagnosis not present

## 2015-01-14 DIAGNOSIS — K7469 Other cirrhosis of liver: Secondary | ICD-10-CM | POA: Diagnosis not present

## 2015-01-14 DIAGNOSIS — R296 Repeated falls: Secondary | ICD-10-CM | POA: Diagnosis not present

## 2015-01-14 DIAGNOSIS — R41841 Cognitive communication deficit: Secondary | ICD-10-CM | POA: Diagnosis not present

## 2015-01-14 DIAGNOSIS — M6281 Muscle weakness (generalized): Secondary | ICD-10-CM | POA: Diagnosis not present

## 2015-01-14 DIAGNOSIS — K7290 Hepatic failure, unspecified without coma: Secondary | ICD-10-CM | POA: Diagnosis not present

## 2015-01-14 DIAGNOSIS — Z9181 History of falling: Secondary | ICD-10-CM | POA: Diagnosis not present

## 2015-01-14 DIAGNOSIS — R2681 Unsteadiness on feet: Secondary | ICD-10-CM | POA: Diagnosis not present

## 2015-01-16 DIAGNOSIS — K7469 Other cirrhosis of liver: Secondary | ICD-10-CM | POA: Diagnosis not present

## 2015-01-16 DIAGNOSIS — Z9181 History of falling: Secondary | ICD-10-CM | POA: Diagnosis not present

## 2015-01-16 DIAGNOSIS — I10 Essential (primary) hypertension: Secondary | ICD-10-CM | POA: Diagnosis not present

## 2015-01-16 DIAGNOSIS — D649 Anemia, unspecified: Secondary | ICD-10-CM | POA: Diagnosis not present

## 2015-01-16 DIAGNOSIS — K859 Acute pancreatitis, unspecified: Secondary | ICD-10-CM | POA: Diagnosis not present

## 2015-01-16 DIAGNOSIS — D696 Thrombocytopenia, unspecified: Secondary | ICD-10-CM | POA: Diagnosis not present

## 2015-01-16 DIAGNOSIS — R41841 Cognitive communication deficit: Secondary | ICD-10-CM | POA: Diagnosis not present

## 2015-01-16 DIAGNOSIS — D61818 Other pancytopenia: Secondary | ICD-10-CM | POA: Diagnosis not present

## 2015-01-16 DIAGNOSIS — R1011 Right upper quadrant pain: Secondary | ICD-10-CM | POA: Diagnosis not present

## 2015-01-16 DIAGNOSIS — R2681 Unsteadiness on feet: Secondary | ICD-10-CM | POA: Diagnosis not present

## 2015-01-16 DIAGNOSIS — R05 Cough: Secondary | ICD-10-CM | POA: Diagnosis not present

## 2015-01-16 DIAGNOSIS — K7290 Hepatic failure, unspecified without coma: Secondary | ICD-10-CM | POA: Diagnosis not present

## 2015-01-16 DIAGNOSIS — Z79899 Other long term (current) drug therapy: Secondary | ICD-10-CM | POA: Diagnosis not present

## 2015-01-16 DIAGNOSIS — R296 Repeated falls: Secondary | ICD-10-CM | POA: Diagnosis not present

## 2015-01-16 DIAGNOSIS — M6281 Muscle weakness (generalized): Secondary | ICD-10-CM | POA: Diagnosis not present

## 2015-01-17 ENCOUNTER — Emergency Department (HOSPITAL_COMMUNITY)
Admission: EM | Admit: 2015-01-17 | Discharge: 2015-01-18 | Disposition: A | Discharge status: Home / Self Care | Payer: Medicare Other | Attending: Emergency Medicine | Admitting: Emergency Medicine

## 2015-01-17 ENCOUNTER — Encounter (HOSPITAL_COMMUNITY): Payer: Self-pay | Admitting: *Deleted

## 2015-01-17 DIAGNOSIS — I2510 Atherosclerotic heart disease of native coronary artery without angina pectoris: Secondary | ICD-10-CM | POA: Diagnosis not present

## 2015-01-17 DIAGNOSIS — R1013 Epigastric pain: Secondary | ICD-10-CM | POA: Insufficient documentation

## 2015-01-17 DIAGNOSIS — I10 Essential (primary) hypertension: Secondary | ICD-10-CM | POA: Insufficient documentation

## 2015-01-17 DIAGNOSIS — J45909 Unspecified asthma, uncomplicated: Secondary | ICD-10-CM | POA: Diagnosis not present

## 2015-01-17 DIAGNOSIS — K7469 Other cirrhosis of liver: Secondary | ICD-10-CM | POA: Diagnosis not present

## 2015-01-17 DIAGNOSIS — Z8669 Personal history of other diseases of the nervous system and sense organs: Secondary | ICD-10-CM | POA: Insufficient documentation

## 2015-01-17 DIAGNOSIS — E119 Type 2 diabetes mellitus without complications: Secondary | ICD-10-CM | POA: Insufficient documentation

## 2015-01-17 DIAGNOSIS — R2681 Unsteadiness on feet: Secondary | ICD-10-CM | POA: Diagnosis not present

## 2015-01-17 DIAGNOSIS — M549 Dorsalgia, unspecified: Secondary | ICD-10-CM | POA: Diagnosis not present

## 2015-01-17 DIAGNOSIS — M6281 Muscle weakness (generalized): Secondary | ICD-10-CM | POA: Diagnosis not present

## 2015-01-17 DIAGNOSIS — K8020 Calculus of gallbladder without cholecystitis without obstruction: Secondary | ICD-10-CM | POA: Diagnosis not present

## 2015-01-17 DIAGNOSIS — R41841 Cognitive communication deficit: Secondary | ICD-10-CM | POA: Diagnosis not present

## 2015-01-17 DIAGNOSIS — Z794 Long term (current) use of insulin: Secondary | ICD-10-CM | POA: Insufficient documentation

## 2015-01-17 DIAGNOSIS — K219 Gastro-esophageal reflux disease without esophagitis: Secondary | ICD-10-CM | POA: Insufficient documentation

## 2015-01-17 DIAGNOSIS — R197 Diarrhea, unspecified: Secondary | ICD-10-CM | POA: Insufficient documentation

## 2015-01-17 DIAGNOSIS — E860 Dehydration: Secondary | ICD-10-CM

## 2015-01-17 DIAGNOSIS — Z79899 Other long term (current) drug therapy: Secondary | ICD-10-CM | POA: Insufficient documentation

## 2015-01-17 DIAGNOSIS — K7290 Hepatic failure, unspecified without coma: Secondary | ICD-10-CM | POA: Diagnosis not present

## 2015-01-17 DIAGNOSIS — R112 Nausea with vomiting, unspecified: Secondary | ICD-10-CM

## 2015-01-17 DIAGNOSIS — Z7982 Long term (current) use of aspirin: Secondary | ICD-10-CM | POA: Insufficient documentation

## 2015-01-17 DIAGNOSIS — R110 Nausea: Secondary | ICD-10-CM | POA: Diagnosis not present

## 2015-01-17 DIAGNOSIS — R1010 Upper abdominal pain, unspecified: Secondary | ICD-10-CM | POA: Diagnosis not present

## 2015-01-17 DIAGNOSIS — R296 Repeated falls: Secondary | ICD-10-CM | POA: Diagnosis not present

## 2015-01-17 DIAGNOSIS — D732 Chronic congestive splenomegaly: Secondary | ICD-10-CM | POA: Diagnosis not present

## 2015-01-17 DIAGNOSIS — R109 Unspecified abdominal pain: Secondary | ICD-10-CM | POA: Diagnosis present

## 2015-01-17 DIAGNOSIS — E669 Obesity, unspecified: Secondary | ICD-10-CM | POA: Insufficient documentation

## 2015-01-17 DIAGNOSIS — Z9181 History of falling: Secondary | ICD-10-CM | POA: Diagnosis not present

## 2015-01-17 LAB — CBC WITH DIFFERENTIAL/PLATELET
BASOS PCT: 1 % (ref 0–1)
Basophils Absolute: 0 10*3/uL (ref 0.0–0.1)
Eosinophils Absolute: 0.1 10*3/uL (ref 0.0–0.7)
Eosinophils Relative: 3 % (ref 0–5)
HEMATOCRIT: 39.2 % (ref 36.0–46.0)
Hemoglobin: 13.3 g/dL (ref 12.0–15.0)
LYMPHS ABS: 1.4 10*3/uL (ref 0.7–4.0)
LYMPHS PCT: 29 % (ref 12–46)
MCH: 27.9 pg (ref 26.0–34.0)
MCHC: 33.9 g/dL (ref 30.0–36.0)
MCV: 82.4 fL (ref 78.0–100.0)
MONO ABS: 0.5 10*3/uL (ref 0.1–1.0)
MONOS PCT: 10 % (ref 3–12)
NEUTROS ABS: 2.8 10*3/uL (ref 1.7–7.7)
Neutrophils Relative %: 57 % (ref 43–77)
Platelets: 180 10*3/uL (ref 150–400)
RBC: 4.76 MIL/uL (ref 3.87–5.11)
RDW: 14.7 % (ref 11.5–15.5)
WBC: 4.8 10*3/uL (ref 4.0–10.5)

## 2015-01-17 LAB — AMMONIA: Ammonia: 48 umol/L — ABNORMAL HIGH (ref 11–32)

## 2015-01-17 LAB — COMPREHENSIVE METABOLIC PANEL
ALT: 21 U/L (ref 0–35)
AST: 23 U/L (ref 0–37)
Albumin: 3.5 g/dL (ref 3.5–5.2)
Alkaline Phosphatase: 132 U/L — ABNORMAL HIGH (ref 39–117)
Anion gap: 8 (ref 5–15)
BUN: 18 mg/dL (ref 6–23)
CALCIUM: 9.5 mg/dL (ref 8.4–10.5)
CO2: 27 mmol/L (ref 19–32)
CREATININE: 0.82 mg/dL (ref 0.50–1.10)
Chloride: 102 mmol/L (ref 96–112)
GFR calc Af Amer: 81 mL/min — ABNORMAL LOW (ref >90)
GFR, EST NON AFRICAN AMERICAN: 70 mL/min — AB (ref >90)
GLUCOSE: 296 mg/dL — AB (ref 70–99)
Potassium: 4.2 mmol/L (ref 3.5–5.1)
Sodium: 137 mmol/L (ref 135–145)
Total Bilirubin: 1.6 mg/dL — ABNORMAL HIGH (ref 0.3–1.2)
Total Protein: 6.8 g/dL (ref 6.0–8.3)

## 2015-01-17 LAB — LIPASE, BLOOD: Lipase: 92 U/L — ABNORMAL HIGH (ref 11–59)

## 2015-01-17 LAB — AMYLASE: AMYLASE: 40 U/L (ref 0–105)

## 2015-01-17 MED ORDER — SODIUM CHLORIDE 0.9 % IV SOLN
1000.0000 mL | INTRAVENOUS | Status: DC
  Administered 2015-01-18: 1000 mL via INTRAVENOUS

## 2015-01-17 MED ORDER — SODIUM CHLORIDE 0.9 % IV SOLN
1000.0000 mL | Freq: Once | INTRAVENOUS | Status: AC
  Administered 2015-01-18: 1000 mL via INTRAVENOUS

## 2015-01-17 MED ORDER — ONDANSETRON HCL 4 MG/2ML IJ SOLN
4.0000 mg | Freq: Once | INTRAMUSCULAR | Status: AC
  Administered 2015-01-18: 4 mg via INTRAVENOUS
  Filled 2015-01-17: qty 2

## 2015-01-17 MED ORDER — FENTANYL CITRATE 0.05 MG/ML IJ SOLN
50.0000 ug | Freq: Once | INTRAMUSCULAR | Status: AC
  Administered 2015-01-18: 50 ug via INTRAVENOUS
  Filled 2015-01-17: qty 2

## 2015-01-17 NOTE — ED Notes (Signed)
Pt is from [facility], sent to ER for pancreatitis, elevated ammonia level,  Nausea, no vomiting for last 2 days.

## 2015-01-17 NOTE — ED Provider Notes (Signed)
CSN: [id number]     Arrival date & time 01/17/15  1745 History  This chart was scribed for Rolland Porter, MD by Molli Posey, ED Scribe. This patient was seen in room APA10/APA10 and the patient's care was started 11:23 PM.    Chief Complaint  Patient presents with  . Abdominal Pain   The history is provided by the patient. No language interpreter was used.   HPI Comments: [name] [name] is a 74 y.o. female with a history of pancreatitis, cirrhosis, DM and chronic gastritis  who presents to the Emergency Department complaining of worsening abdominal pain over the past week Pt reports her pain sometimes worsens after eating. Family reports associated diarrhea (she is on lactulose) nausea, vomiting and some mild confusion the last few days. She states she has been vomiting once daily and reports about 5 episodes of diarrhea daily. Her husband reports that pt has been in [facility] for the last 2 weeks due to weakness, elevated ammonia levels and pancreatitis. Her husband says that pt had a gallbladder flare up 2 weeks ago which exacerbated her pancreatitis. He states she is not a surgical candidate. He states that pt has been in a wheelchair bound for the last 1 to 2 years. Husband denies any history of smoking or drinking.   PCP Dr Hilma Favors [facility] physician Dr Maudie Mercury GI Dr Gala Romney  Past Medical History  Diagnosis Date  . CAD in native artery   . Thrombocytopenia, unspecified   . Unspecified hereditary and idiopathic peripheral neuropathy   . Unspecified fall   . Other and unspecified hyperlipidemia   . Unspecified essential hypertension   . Obesity, unspecified   . Personal history of neurosis   . Intrinsic asthma, unspecified   . Allergic rhinitis, cause unspecified   . Cirrhosis of liver     NASH, afp on 06/17/12 =3.7, per pt she had hep B vaccines in 1996, hep A in process.   . Colon adenomas 03/08/10    tcs by Dr. Gala Romney  . Hyperplastic polyps of stomach 03/08/10    tcs by Dr. Dudley Major  . Chronic  gastritis 03/09/11    egd by Dr. Gala Romney  . History of hemorrhoids 03/08/10    tcs- internal and external  . Diverticula of colon 03/08/10    L side  . Esophagitis, erosive 03/08/10  . GERD (gastroesophageal reflux disease)   . Wears glasses   . Type II or unspecified type diabetes mellitus without mention of complication, not stated as uncontrolled   . Vertigo   . Pancreatitis   . Bilateral leg weakness   . Thrombocytopenia 04/12/2014  . Pancytopenia, acquired 04/12/2014  . Cirrhosis   . Abnormal EKG     hx of left anterior fasicular block on 04-09-13 ekg epic  . Chronic shoulder pain    Past Surgical History  Procedure Laterality Date  . Hysterectomy and btl      s/p  . Tumor excision  2003    rt arm and left foot  . Colonoscopy  03/08/10    Dr. [name]-->ext/int hemorrhoids, anal paipilla, rectal polyps, desc polyps, cecal polyp, left-sided diverticula. suboptiomal prep. next TCS 02/2015. multiple adenomas  . Esophagogastroduodenoscopy  03/08/10    Dr. [name] erosive RE, small hh, antral erosions, small 1cm are of mucosal indentation along gastric body of doubtful significance, cystic nodularity of hypophyarynx, base of tongue, base of epiglottis. benign gastric biopsies  . Tubal ligation    . Esophagogastroduodenoscopy  10/08/2011    Dr.  [name]-no esophageal varices, antral erosion. next egd 09/2013  . Foot surgery  2003    lt foot  . Eye surgery      cataracts bilateral  . Orif humerus fracture Right 02/17/2013    Procedure: OPEN REDUCTION INTERNAL FIXATION (ORIF) RIGHT PROXIMAL HUMERUS FRACTURE;  Surgeon: [name], MD;  Location: [facility];  Service: Orthopedics;  Laterality: Right;  . Partial gastrectomy      family denies  . Esophagogastroduodenoscopy N/A 02/17/2014    Dr. Gala Romney: portal gastropathy, no varices. Due for surveillance 2017  . Breast surgery Right few yrs ago    benign area removed  . Abdominal hysterectomy    . Cardiac catheterization  02/25/2007    Dr. [name]:   normal LV systolic function, mild irregularities of LAD   Family History  Problem Relation Age of Onset  . Coronary artery disease      FH  . Diabetes      FH  . Heart disease Mother   . Colon cancer Neg Hx    History  Substance Use Topics  . Smoking status: Never Smoker   . Smokeless tobacco: Never Used  . Alcohol Use: No   In NH for rehab, was living at home with husband Wheelchair bound  OB History    No data available     Review of Systems  Gastrointestinal: Positive for nausea, abdominal pain and diarrhea.  Musculoskeletal: Positive for back pain.  Psychiatric/Behavioral: Positive for confusion.  All other systems reviewed and are negative.     Allergies  Morphine and related and Compazine  Home Medications   Prior to Admission medications   Medication Sig Start Date End Date Taking? Authorizing Provider  ALPRAZolam Duanne Moron) 0.5 MG tablet Take 1 tablet (0.5 mg total) by mouth 3 (three) times daily as needed for anxiety. 12/24/14   [name], MD  aspirin EC 325 MG EC tablet Take 1 tablet (325 mg total) by mouth every morning. 07/14/14   Nita Sells, MD  budesonide-formoterol (SYMBICORT) 160-4.5 MCG/ACT inhaler Inhale 2 puffs into the lungs at bedtime.     Historical Provider, MD  enalapril (VASOTEC) 5 MG tablet Take 1 tablet by mouth daily. 08/25/14   Historical Provider, MD  ESTRACE VAGINAL 0.1 MG/GM vaginal cream Place 1 Applicatorful vaginally every other day. At  bedtime 08/11/13   Historical Provider, MD  furosemide (LASIX) 20 MG tablet Take 1 tablet (20 mg total) by mouth every morning. 09/03/14   Mahala Menghini, PA-C  insulin aspart (NOVOLOG FLEXPEN) 100 UNIT/ML FlexPen Inject 2-10 Units into the skin 3 (three) times daily with meals. Sliding Scale: 200-250= 2 units 251-300= 4 units 301-350= 6 units 351-400= 8 units If levels are over 400, GIVE 10 UNITS AND CALL MD    Historical Provider, MD  insulin detemir (LEVEMIR) 100 UNIT/ML injection  Inject 60 Units into the skin at bedtime.     Historical Provider, MD  lactulose (CHRONULAC) 10 GM/15ML solution Take 60 mLs by mouth 2 (two) times daily. 10/20/14   Historical Provider, MD  meclizine (ANTIVERT) 25 MG tablet Take 25 mg by mouth 3 (three) times daily as needed for dizziness or nausea.     Historical Provider, MD  Multiple Vitamins-Minerals (CENTRUM SILVER PO) Take 1 tablet by mouth daily.     Historical Provider, MD  NEXIUM 40 MG capsule Take 40 mg by mouth every morning.  06/17/14   Historical Provider, MD  ondansetron (ZOFRAN ODT) 4 MG disintegrating tablet Take  1 tablet (4 mg total) by mouth every 8 (eight) hours as needed for nausea or vomiting. 01/18/15   Rolland Porter, MD  ondansetron (ZOFRAN) 4 MG tablet Take 1 tablet (4 mg total) by mouth every 6 (six) hours as needed for nausea. 08/06/14   Radene Gunning, NP  OxyCODONE (OXYCONTIN) 10 mg T12A 12 hr tablet Take 1 tablet (10 mg total) by mouth 2 (two) times daily as needed (severe pain). 12/24/14   [name], MD  pregabalin (LYRICA) 100 MG capsule Take 100 mg by mouth 2 (two) times daily.    Historical Provider, MD  quinapril (ACCUPRIL) 5 MG tablet Take 5 mg by mouth at bedtime.    Historical Provider, MD  rifaximin (XIFAXAN) 550 MG TABS tablet Take 1 tablet (550 mg total) by mouth 2 (two) times daily. Patient not taking: Reported on 12/23/2014 06/28/14   Kathie Dike, MD  spironolactone (ALDACTONE) 50 MG tablet Take 1 tablet (50 mg total) by mouth every morning. 10/19/14   Mahala Menghini, PA-C  triamcinolone cream (KENALOG) 0.1 % Apply 1 application topically daily as needed (for irritation).  06/26/13   Historical Provider, MD  ursodiol (URSO FORTE) 500 MG tablet Take 500 mg by mouth 2 (two) times daily.    Historical Provider, MD   BP 117/51 mmHg  Pulse 78  Temp(Src) 98.7 F (37.1 C) (Oral)  Resp 20  Ht 5\' 8"  (1.727 m)  Wt 238 lb (107.956 kg)  BMI 36.20 kg/m2  SpO2 98% Physical Exam  Constitutional: She is oriented to  person, place, and time. She appears well-developed and well-nourished.  Non-toxic appearance. She does not appear ill. No distress.  HENT:  Head: Normocephalic and atraumatic.  Right Ear: External ear normal.  Left Ear: External ear normal.  Nose: Nose normal. No mucosal edema or rhinorrhea.  Mouth/Throat: Oropharynx is clear and moist and mucous membranes are normal. No dental abscesses or uvula swelling.  Eyes: Conjunctivae and EOM are normal. Pupils are equal, round, and reactive to light.  Neck: Normal range of motion and full passive range of motion without pain. Neck supple.  Cardiovascular: Normal rate, regular rhythm and normal heart sounds.  Exam reveals no gallop and no friction rub.   No murmur heard. Pulmonary/Chest: Effort normal and breath sounds normal. No respiratory distress. She has no wheezes. She has no rhonchi. She has no rales. She exhibits no tenderness and no crepitus.  Abdominal: Soft. Normal appearance and bowel sounds are normal. She exhibits no distension. There is tenderness. There is no rebound and no guarding.  Epigastric tenderness   Musculoskeletal: Normal range of motion. She exhibits no edema or tenderness.  Moves all extremities well.   Neurological: She is alert and oriented to person, place, and time. She has normal strength. No cranial nerve deficit.  Skin: Skin is warm, dry and intact. No rash noted. No erythema. No pallor.  Psychiatric: She has a normal mood and affect. Her speech is normal and behavior is normal. Her mood appears not anxious.  Nursing note and vitals reviewed.   ED Course  Procedures    Medications  0.9 %  sodium chloride infusion (0 mLs Intravenous Stopped 01/18/15 0204)    Followed by  0.9 %  sodium chloride infusion (0 mLs Intravenous Stopped 01/18/15 0333)    Followed by  0.9 %  sodium chloride infusion (1,000 mLs Intravenous New Bag/Given 01/18/15 0334)  ondansetron (ZOFRAN) injection 4 mg (4 mg Intravenous Given 01/18/15 0034)  fentaNYL (SUBLIMAZE) injection 50 mcg (50 mcg Intravenous Given 01/18/15 0037)  iohexol (OMNIPAQUE) 300 MG/ML solution 50 mL (50 mLs Oral Contrast Given 01/18/15 0010)  iohexol (OMNIPAQUE) 300 MG/ML solution 100 mL (100 mLs Intravenous Contrast Given 01/18/15 0119)    DIAGNOSTIC STUDIES: Oxygen Saturation is 99% on RA, normal by my interpretation.    COORDINATION OF CARE: 11:33 PM Discussed treatment plan with pt at bedside and pt agreed to plan.  CT scan ordered since patient's lab work were not as elevated as was expected.  Nursing staff reports patient was able to drink her oral contrast without vomiting. She was able to drink oral fluids. When I rechecked her at discharge to discuss her first test results patient states she is starting to have urinary output 4. She states she feels like she is hydrated now. She feels improved and feels like she can return to her nursing facility.   Labs Review Results for orders placed or performed during the hospital encounter of 01/17/15  CBC with Differential  Result Value Ref Range   WBC 4.8 4.0 - 10.5 K/uL   RBC 4.76 3.87 - 5.11 MIL/uL   Hemoglobin 13.3 12.0 - 15.0 g/dL   HCT 39.2 36.0 - 46.0 %   MCV 82.4 78.0 - 100.0 fL   MCH 27.9 26.0 - 34.0 pg   MCHC 33.9 30.0 - 36.0 g/dL   RDW 14.7 11.5 - 15.5 %   Platelets 180 150 - 400 K/uL   Neutrophils Relative % 57 43 - 77 %   Neutro Abs 2.8 1.7 - 7.7 K/uL   Lymphocytes Relative 29 12 - 46 %   Lymphs Abs 1.4 0.7 - 4.0 K/uL   Monocytes Relative 10 3 - 12 %   Monocytes Absolute 0.5 0.1 - 1.0 K/uL   Eosinophils Relative 3 0 - 5 %   Eosinophils Absolute 0.1 0.0 - 0.7 K/uL   Basophils Relative 1 0 - 1 %   Basophils Absolute 0.0 0.0 - 0.1 K/uL  Amylase  Result Value Ref Range   Amylase 40 0 - 105 U/L  Lipase, blood  Result Value Ref Range   Lipase 92 (H) 11 - 59 U/L  Ammonia  Result Value Ref Range   Ammonia 48 (H) 11 - 32 umol/L  Comprehensive metabolic panel  Result Value Ref Range    Sodium 137 135 - 145 mmol/L   Potassium 4.2 3.5 - 5.1 mmol/L   Chloride 102 96 - 112 mmol/L   CO2 27 19 - 32 mmol/L   Glucose, Bld 296 (H) 70 - 99 mg/dL   BUN 18 6 - 23 mg/dL   Creatinine, Ser 0.82 0.50 - 1.10 mg/dL   Calcium 9.5 8.4 - 10.5 mg/dL   Total Protein 6.8 6.0 - 8.3 g/dL   Albumin 3.5 3.5 - 5.2 g/dL   AST 23 0 - 37 U/L   ALT 21 0 - 35 U/L   Alkaline Phosphatase 132 (H) 39 - 117 U/L   Total Bilirubin 1.6 (H) 0.3 - 1.2 mg/dL   GFR calc non Af Amer 70 (L) >90 mL/min   GFR calc Af Amer 81 (L) >90 mL/min   Anion gap 8 5 - 15  Urinalysis, Routine w reflex microscopic  Result Value Ref Range   Color, Urine AMBER (A) YELLOW   APPearance CLEAR CLEAR   Specific Gravity, Urine 1.020 1.005 - 1.030   pH 5.5 5.0 - 8.0   Glucose, UA 100 (A) NEGATIVE mg/dL  Hgb urine dipstick NEGATIVE NEGATIVE   Bilirubin Urine NEGATIVE NEGATIVE   Ketones, ur TRACE (A) NEGATIVE mg/dL   Protein, ur NEGATIVE NEGATIVE mg/dL   Urobilinogen, UA 1.0 0.0 - 1.0 mg/dL   Nitrite NEGATIVE NEGATIVE   Leukocytes, UA NEGATIVE NEGATIVE    Laboratory interpretation all normal except elevated ammonia however this is lower than the high in the 90s she's had several times before, her lipase is also a little elevated but has been much higher in September.    Imaging Review Ct Abdomen Pelvis W Contrast  01/18/2015   CLINICAL DATA:  Worsening abdominal pain for 5 days. Diarrhea, nausea, and vomiting. History pancreatitis, cirrhosis, diabetes, and chronic gastritis.  EXAM: CT ABDOMEN AND PELVIS WITH CONTRAST  TECHNIQUE: Multidetector CT imaging of the abdomen and pelvis was performed using the standard protocol following bolus administration of intravenous contrast.  CONTRAST:  102mL OMNIPAQUE IOHEXOL 300 MG/ML SOLN, 188mL OMNIPAQUE IOHEXOL 300 MG/ML SOLN  COMPARISON:  04/26/2013  FINDINGS: 6 mm nodule in the anterior right lung base and 2 mm nodule in the left lung base. These are unchanged since prior study and likely  benign.  Cirrhotic configuration of the liver with enlarged lateral segment left lobe. Low-attenuation lesion in the right lobe is unchanged since prior study and probably represents a small cyst. Cavernous transformation of the portal vein. Splenic enlargement. Cholelithiasis without inflammatory change in the gallbladder. Pancreas, adrenal glands, inferior vena cava, abdominal aorta, and retroperitoneal lymph nodes are unremarkable. 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 No hydronephrosis in either kidney. Small parapelvic cysts on the left. The stomach, small bowel, and colon are not abnormally distended. No free air or free fluid in the abdomen.  Pelvis: Uterus appears surgically absent. Ovaries are not enlarged. There is a small paraovarian cystic lesion adjacent to the left ovary, measuring 1.3 x 2 cm. This is new since previous study. Ultrasound is recommended for further evaluation. Scattered diverticula in the sigmoid colon without evidence of diverticulitis. No free or loculated pelvic fluid collections. Degenerative changes in the lumbar spine. Mild compression of superior endplate of L2. This is new since previous study but is of indeterminate age.  IMPRESSION: Hepatic cirrhosis with portal venous hypertension including splenic enlargement and cavernous transformation of the portal vein. Cholelithiasis without inflammatory changes. Unchanged hepatic cyst and small nodules in the lung bases. Pancreas appears normal. No inflammatory changes. There is a small adnexal cyst on the left which is new since prior study and measures 2 cm maximal diameter. Ultrasound suggested for better characterization. 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   Electronically Signed   By: Lucienne Capers M.D.   On: 01/18/2015 01:58     EKG Interpretation None      MDM   Final diagnoses:  Upper abdominal pain  Nausea and vomiting, vomiting of unspecified type   Dehydration    New Prescriptions   ONDANSETRON (ZOFRAN ODT) 4 MG DISINTEGRATING TABLET    Take 1 tablet (4 mg total) by mouth every 8 (eight) hours as needed for nausea or vomiting.    Plan discharge  Rolland Porter, MD, FACEP   I personally performed the services described in this documentation, which was scribed in my presence. The recorded information has been reviewed and considered.  Rolland Porter, MD, Barbette Or, MD 01/18/15 [phone]

## 2015-01-18 ENCOUNTER — Emergency Department (HOSPITAL_COMMUNITY): Payer: Medicare Other

## 2015-01-18 DIAGNOSIS — K8020 Calculus of gallbladder without cholecystitis without obstruction: Secondary | ICD-10-CM | POA: Diagnosis not present

## 2015-01-18 DIAGNOSIS — R110 Nausea: Secondary | ICD-10-CM | POA: Diagnosis not present

## 2015-01-18 DIAGNOSIS — K7469 Other cirrhosis of liver: Secondary | ICD-10-CM | POA: Diagnosis not present

## 2015-01-18 DIAGNOSIS — R2681 Unsteadiness on feet: Secondary | ICD-10-CM | POA: Diagnosis not present

## 2015-01-18 DIAGNOSIS — R1013 Epigastric pain: Secondary | ICD-10-CM | POA: Diagnosis not present

## 2015-01-18 DIAGNOSIS — D732 Chronic congestive splenomegaly: Secondary | ICD-10-CM | POA: Diagnosis not present

## 2015-01-18 DIAGNOSIS — R41841 Cognitive communication deficit: Secondary | ICD-10-CM | POA: Diagnosis not present

## 2015-01-18 DIAGNOSIS — R197 Diarrhea, unspecified: Secondary | ICD-10-CM | POA: Diagnosis not present

## 2015-01-18 DIAGNOSIS — K7290 Hepatic failure, unspecified without coma: Secondary | ICD-10-CM | POA: Diagnosis not present

## 2015-01-18 DIAGNOSIS — M6281 Muscle weakness (generalized): Secondary | ICD-10-CM | POA: Diagnosis not present

## 2015-01-18 DIAGNOSIS — E119 Type 2 diabetes mellitus without complications: Secondary | ICD-10-CM | POA: Diagnosis not present

## 2015-01-18 DIAGNOSIS — E860 Dehydration: Secondary | ICD-10-CM | POA: Diagnosis not present

## 2015-01-18 DIAGNOSIS — Z9181 History of falling: Secondary | ICD-10-CM | POA: Diagnosis not present

## 2015-01-18 DIAGNOSIS — R296 Repeated falls: Secondary | ICD-10-CM | POA: Diagnosis not present

## 2015-01-18 LAB — URINALYSIS, ROUTINE W REFLEX MICROSCOPIC
Bilirubin Urine: NEGATIVE
Glucose, UA: 100 mg/dL — AB
Hgb urine dipstick: NEGATIVE
Leukocytes, UA: NEGATIVE
NITRITE: NEGATIVE
PH: 5.5 (ref 5.0–8.0)
PROTEIN: NEGATIVE mg/dL
SPECIFIC GRAVITY, URINE: 1.02 (ref 1.005–1.030)
UROBILINOGEN UA: 1 mg/dL (ref 0.0–1.0)

## 2015-01-18 MED ORDER — IOHEXOL 300 MG/ML  SOLN
100.0000 mL | Freq: Once | INTRAMUSCULAR | Status: AC | PRN
  Administered 2015-01-18: 100 mL via INTRAVENOUS

## 2015-01-18 MED ORDER — ONDANSETRON 4 MG PO TBDP: 4.0000 mg | ORAL_TABLET | Freq: Three times a day (TID) | ORAL | Status: DC | PRN

## 2015-01-18 MED ORDER — IOHEXOL 300 MG/ML  SOLN
50.0000 mL | Freq: Once | INTRAMUSCULAR | Status: AC | PRN
  Administered 2015-01-18: 50 mL via ORAL

## 2015-01-18 NOTE — Discharge Instructions (Signed)
Your CT scan tonight does not show any acute inflammation of your pancreas or your gallbladder. There are not any acute findings. Your blood tests here today were better than tests you've had in the past. Your ammonia tonight was 48 and has been in the 90's before, your lipase was 92 but has been 160 in September. You were able to drink fluids without vomiting with the nausea medication and you were hydrated with IV fluids to correct your dehydration.  Recheck with your doctors as needed. Use the zofran for nausea or vomiting. Watch what you eat for a couple of days, nothing fried, spicy or greasy.

## 2015-01-18 NOTE — ED Notes (Signed)
Pt alert & oriented x4, stable gait. Patient  given discharge instructions, paperwork & prescription(s). Patient verbalized understanding. Pt left department in wheelchair w/ no further questions. Report called back to [facility] & given to April, copy of labs provided to pt to give facility for MD. Family member drove pt back to facility.

## 2015-01-18 NOTE — ED Notes (Signed)
Pt back from CT

## 2015-01-19 DIAGNOSIS — R296 Repeated falls: Secondary | ICD-10-CM | POA: Diagnosis not present

## 2015-01-19 DIAGNOSIS — M6281 Muscle weakness (generalized): Secondary | ICD-10-CM | POA: Diagnosis not present

## 2015-01-19 DIAGNOSIS — R41841 Cognitive communication deficit: Secondary | ICD-10-CM | POA: Diagnosis not present

## 2015-01-19 DIAGNOSIS — R2681 Unsteadiness on feet: Secondary | ICD-10-CM | POA: Diagnosis not present

## 2015-01-19 DIAGNOSIS — Z9181 History of falling: Secondary | ICD-10-CM | POA: Diagnosis not present

## 2015-01-20 DIAGNOSIS — R41841 Cognitive communication deficit: Secondary | ICD-10-CM | POA: Diagnosis not present

## 2015-01-20 DIAGNOSIS — M6281 Muscle weakness (generalized): Secondary | ICD-10-CM | POA: Diagnosis not present

## 2015-01-20 DIAGNOSIS — R296 Repeated falls: Secondary | ICD-10-CM | POA: Diagnosis not present

## 2015-01-20 DIAGNOSIS — Z9181 History of falling: Secondary | ICD-10-CM | POA: Diagnosis not present

## 2015-01-20 DIAGNOSIS — R2681 Unsteadiness on feet: Secondary | ICD-10-CM | POA: Diagnosis not present

## 2015-01-21 DIAGNOSIS — R296 Repeated falls: Secondary | ICD-10-CM | POA: Diagnosis not present

## 2015-01-21 DIAGNOSIS — R41841 Cognitive communication deficit: Secondary | ICD-10-CM | POA: Diagnosis not present

## 2015-01-21 DIAGNOSIS — E119 Type 2 diabetes mellitus without complications: Secondary | ICD-10-CM | POA: Diagnosis not present

## 2015-01-21 DIAGNOSIS — Z9181 History of falling: Secondary | ICD-10-CM | POA: Diagnosis not present

## 2015-01-21 DIAGNOSIS — S91109A Unspecified open wound of unspecified toe(s) without damage to nail, initial encounter: Secondary | ICD-10-CM | POA: Diagnosis not present

## 2015-01-21 DIAGNOSIS — R2681 Unsteadiness on feet: Secondary | ICD-10-CM | POA: Diagnosis not present

## 2015-01-21 DIAGNOSIS — K861 Other chronic pancreatitis: Secondary | ICD-10-CM | POA: Diagnosis not present

## 2015-01-21 DIAGNOSIS — M6281 Muscle weakness (generalized): Secondary | ICD-10-CM | POA: Diagnosis not present

## 2015-01-22 DIAGNOSIS — R296 Repeated falls: Secondary | ICD-10-CM | POA: Diagnosis not present

## 2015-01-22 DIAGNOSIS — R2681 Unsteadiness on feet: Secondary | ICD-10-CM | POA: Diagnosis not present

## 2015-01-22 DIAGNOSIS — Z9181 History of falling: Secondary | ICD-10-CM | POA: Diagnosis not present

## 2015-01-22 DIAGNOSIS — M6281 Muscle weakness (generalized): Secondary | ICD-10-CM | POA: Diagnosis not present

## 2015-01-22 DIAGNOSIS — R41841 Cognitive communication deficit: Secondary | ICD-10-CM | POA: Diagnosis not present

## 2015-01-24 DIAGNOSIS — M6281 Muscle weakness (generalized): Secondary | ICD-10-CM | POA: Diagnosis not present

## 2015-01-24 DIAGNOSIS — R41841 Cognitive communication deficit: Secondary | ICD-10-CM | POA: Diagnosis not present

## 2015-01-24 DIAGNOSIS — R2681 Unsteadiness on feet: Secondary | ICD-10-CM | POA: Diagnosis not present

## 2015-01-24 DIAGNOSIS — R296 Repeated falls: Secondary | ICD-10-CM | POA: Diagnosis not present

## 2015-01-24 DIAGNOSIS — Z9181 History of falling: Secondary | ICD-10-CM | POA: Diagnosis not present

## 2015-01-25 DIAGNOSIS — Z9181 History of falling: Secondary | ICD-10-CM | POA: Diagnosis not present

## 2015-01-25 DIAGNOSIS — M6281 Muscle weakness (generalized): Secondary | ICD-10-CM | POA: Diagnosis not present

## 2015-01-25 DIAGNOSIS — R41841 Cognitive communication deficit: Secondary | ICD-10-CM | POA: Diagnosis not present

## 2015-01-25 DIAGNOSIS — R296 Repeated falls: Secondary | ICD-10-CM | POA: Diagnosis not present

## 2015-01-25 DIAGNOSIS — R2681 Unsteadiness on feet: Secondary | ICD-10-CM | POA: Diagnosis not present

## 2015-01-26 DIAGNOSIS — K7290 Hepatic failure, unspecified without coma: Secondary | ICD-10-CM | POA: Diagnosis not present

## 2015-01-26 DIAGNOSIS — S91109A Unspecified open wound of unspecified toe(s) without damage to nail, initial encounter: Secondary | ICD-10-CM | POA: Diagnosis not present

## 2015-01-26 DIAGNOSIS — R296 Repeated falls: Secondary | ICD-10-CM | POA: Diagnosis not present

## 2015-01-26 DIAGNOSIS — E119 Type 2 diabetes mellitus without complications: Secondary | ICD-10-CM | POA: Diagnosis not present

## 2015-01-26 DIAGNOSIS — R41841 Cognitive communication deficit: Secondary | ICD-10-CM | POA: Diagnosis not present

## 2015-01-26 DIAGNOSIS — Z9181 History of falling: Secondary | ICD-10-CM | POA: Diagnosis not present

## 2015-01-26 DIAGNOSIS — M6281 Muscle weakness (generalized): Secondary | ICD-10-CM | POA: Diagnosis not present

## 2015-01-26 DIAGNOSIS — R2681 Unsteadiness on feet: Secondary | ICD-10-CM | POA: Diagnosis not present

## 2015-01-26 DIAGNOSIS — Z79899 Other long term (current) drug therapy: Secondary | ICD-10-CM | POA: Diagnosis not present

## 2015-01-26 DIAGNOSIS — K861 Other chronic pancreatitis: Secondary | ICD-10-CM | POA: Diagnosis not present

## 2015-01-27 DIAGNOSIS — R41841 Cognitive communication deficit: Secondary | ICD-10-CM | POA: Diagnosis not present

## 2015-01-27 DIAGNOSIS — M6281 Muscle weakness (generalized): Secondary | ICD-10-CM | POA: Diagnosis not present

## 2015-01-27 DIAGNOSIS — L84 Corns and callosities: Secondary | ICD-10-CM | POA: Diagnosis not present

## 2015-01-27 DIAGNOSIS — R2681 Unsteadiness on feet: Secondary | ICD-10-CM | POA: Diagnosis not present

## 2015-01-27 DIAGNOSIS — R296 Repeated falls: Secondary | ICD-10-CM | POA: Diagnosis not present

## 2015-01-27 DIAGNOSIS — K7290 Hepatic failure, unspecified without coma: Secondary | ICD-10-CM | POA: Diagnosis not present

## 2015-01-27 DIAGNOSIS — Z9181 History of falling: Secondary | ICD-10-CM | POA: Diagnosis not present

## 2015-01-27 DIAGNOSIS — E1151 Type 2 diabetes mellitus with diabetic peripheral angiopathy without gangrene: Secondary | ICD-10-CM | POA: Diagnosis not present

## 2015-01-28 DIAGNOSIS — N179 Acute kidney failure, unspecified: Secondary | ICD-10-CM | POA: Diagnosis not present

## 2015-01-28 DIAGNOSIS — E875 Hyperkalemia: Secondary | ICD-10-CM | POA: Diagnosis not present

## 2015-01-28 DIAGNOSIS — R1011 Right upper quadrant pain: Secondary | ICD-10-CM | POA: Diagnosis not present

## 2015-01-28 DIAGNOSIS — Z9181 History of falling: Secondary | ICD-10-CM | POA: Diagnosis not present

## 2015-01-28 DIAGNOSIS — M6281 Muscle weakness (generalized): Secondary | ICD-10-CM | POA: Diagnosis not present

## 2015-01-28 DIAGNOSIS — R296 Repeated falls: Secondary | ICD-10-CM | POA: Diagnosis not present

## 2015-01-28 DIAGNOSIS — K7290 Hepatic failure, unspecified without coma: Secondary | ICD-10-CM | POA: Diagnosis not present

## 2015-01-28 DIAGNOSIS — S91109A Unspecified open wound of unspecified toe(s) without damage to nail, initial encounter: Secondary | ICD-10-CM | POA: Diagnosis not present

## 2015-01-28 DIAGNOSIS — R41841 Cognitive communication deficit: Secondary | ICD-10-CM | POA: Diagnosis not present

## 2015-01-28 DIAGNOSIS — R2681 Unsteadiness on feet: Secondary | ICD-10-CM | POA: Diagnosis not present

## 2015-01-30 DIAGNOSIS — R2681 Unsteadiness on feet: Secondary | ICD-10-CM | POA: Diagnosis not present

## 2015-01-30 DIAGNOSIS — R41841 Cognitive communication deficit: Secondary | ICD-10-CM | POA: Diagnosis not present

## 2015-01-30 DIAGNOSIS — R296 Repeated falls: Secondary | ICD-10-CM | POA: Diagnosis not present

## 2015-01-30 DIAGNOSIS — Z9181 History of falling: Secondary | ICD-10-CM | POA: Diagnosis not present

## 2015-01-30 DIAGNOSIS — M6281 Muscle weakness (generalized): Secondary | ICD-10-CM | POA: Diagnosis not present

## 2015-01-31 DIAGNOSIS — M6281 Muscle weakness (generalized): Secondary | ICD-10-CM | POA: Diagnosis not present

## 2015-01-31 DIAGNOSIS — L600 Ingrowing nail: Secondary | ICD-10-CM | POA: Diagnosis not present

## 2015-01-31 DIAGNOSIS — Z9181 History of falling: Secondary | ICD-10-CM | POA: Diagnosis not present

## 2015-01-31 DIAGNOSIS — R296 Repeated falls: Secondary | ICD-10-CM | POA: Diagnosis not present

## 2015-01-31 DIAGNOSIS — R2681 Unsteadiness on feet: Secondary | ICD-10-CM | POA: Diagnosis not present

## 2015-01-31 DIAGNOSIS — L03032 Cellulitis of left toe: Secondary | ICD-10-CM | POA: Diagnosis not present

## 2015-01-31 DIAGNOSIS — M79675 Pain in left toe(s): Secondary | ICD-10-CM | POA: Diagnosis not present

## 2015-01-31 DIAGNOSIS — R41841 Cognitive communication deficit: Secondary | ICD-10-CM | POA: Diagnosis not present

## 2015-01-31 DIAGNOSIS — L609 Nail disorder, unspecified: Secondary | ICD-10-CM | POA: Diagnosis not present

## 2015-01-31 DIAGNOSIS — L03039 Cellulitis of unspecified toe: Secondary | ICD-10-CM | POA: Diagnosis not present

## 2015-02-01 DIAGNOSIS — R41841 Cognitive communication deficit: Secondary | ICD-10-CM | POA: Diagnosis not present

## 2015-02-01 DIAGNOSIS — R296 Repeated falls: Secondary | ICD-10-CM | POA: Diagnosis not present

## 2015-02-01 DIAGNOSIS — M6281 Muscle weakness (generalized): Secondary | ICD-10-CM | POA: Diagnosis not present

## 2015-02-01 DIAGNOSIS — R2681 Unsteadiness on feet: Secondary | ICD-10-CM | POA: Diagnosis not present

## 2015-02-01 DIAGNOSIS — Z9181 History of falling: Secondary | ICD-10-CM | POA: Diagnosis not present

## 2015-02-02 ENCOUNTER — Encounter: Payer: Self-pay | Admitting: Internal Medicine

## 2015-02-02 DIAGNOSIS — N179 Acute kidney failure, unspecified: Secondary | ICD-10-CM | POA: Diagnosis not present

## 2015-02-02 DIAGNOSIS — Z9181 History of falling: Secondary | ICD-10-CM | POA: Diagnosis not present

## 2015-02-02 DIAGNOSIS — Z79899 Other long term (current) drug therapy: Secondary | ICD-10-CM | POA: Diagnosis not present

## 2015-02-02 DIAGNOSIS — R41841 Cognitive communication deficit: Secondary | ICD-10-CM | POA: Diagnosis not present

## 2015-02-02 DIAGNOSIS — S91109A Unspecified open wound of unspecified toe(s) without damage to nail, initial encounter: Secondary | ICD-10-CM | POA: Diagnosis not present

## 2015-02-02 DIAGNOSIS — M6281 Muscle weakness (generalized): Secondary | ICD-10-CM | POA: Diagnosis not present

## 2015-02-02 DIAGNOSIS — K7290 Hepatic failure, unspecified without coma: Secondary | ICD-10-CM | POA: Diagnosis not present

## 2015-02-02 DIAGNOSIS — R296 Repeated falls: Secondary | ICD-10-CM | POA: Diagnosis not present

## 2015-02-02 DIAGNOSIS — R2681 Unsteadiness on feet: Secondary | ICD-10-CM | POA: Diagnosis not present

## 2015-02-02 DIAGNOSIS — D649 Anemia, unspecified: Secondary | ICD-10-CM | POA: Diagnosis not present

## 2015-02-02 DIAGNOSIS — E875 Hyperkalemia: Secondary | ICD-10-CM | POA: Diagnosis not present

## 2015-02-02 DIAGNOSIS — I10 Essential (primary) hypertension: Secondary | ICD-10-CM | POA: Diagnosis not present

## 2015-02-02 DIAGNOSIS — R1011 Right upper quadrant pain: Secondary | ICD-10-CM | POA: Diagnosis not present

## 2015-02-03 ENCOUNTER — Other Ambulatory Visit: Payer: Self-pay | Admitting: Internal Medicine

## 2015-02-03 ENCOUNTER — Other Ambulatory Visit: Payer: Self-pay

## 2015-02-03 ENCOUNTER — Telehealth: Payer: Self-pay | Admitting: Internal Medicine

## 2015-02-03 DIAGNOSIS — R2681 Unsteadiness on feet: Secondary | ICD-10-CM | POA: Diagnosis not present

## 2015-02-03 DIAGNOSIS — I10 Essential (primary) hypertension: Secondary | ICD-10-CM | POA: Diagnosis not present

## 2015-02-03 DIAGNOSIS — Z9181 History of falling: Secondary | ICD-10-CM | POA: Diagnosis not present

## 2015-02-03 DIAGNOSIS — R41841 Cognitive communication deficit: Secondary | ICD-10-CM | POA: Diagnosis not present

## 2015-02-03 DIAGNOSIS — K7460 Unspecified cirrhosis of liver: Secondary | ICD-10-CM

## 2015-02-03 DIAGNOSIS — K7290 Hepatic failure, unspecified without coma: Secondary | ICD-10-CM | POA: Diagnosis not present

## 2015-02-03 DIAGNOSIS — M6281 Muscle weakness (generalized): Secondary | ICD-10-CM | POA: Diagnosis not present

## 2015-02-03 DIAGNOSIS — Z79899 Other long term (current) drug therapy: Secondary | ICD-10-CM | POA: Diagnosis not present

## 2015-02-03 DIAGNOSIS — R296 Repeated falls: Secondary | ICD-10-CM | POA: Diagnosis not present

## 2015-02-03 NOTE — Telephone Encounter (Signed)
ON April RECALL FOR LIVER ULTRASOUND AND OV AFTER

## 2015-02-03 NOTE — Telephone Encounter (Signed)
Mailed letter to pt

## 2015-02-03 NOTE — Telephone Encounter (Signed)
Letter not mailed.  Spoke with Husband [name]). Pt is at [facility] and she has been set up for [place] on 02/09/2015 @ 915am. Faxed [facility] and spoke to nurse Claiborne Billings). She is aware that pt needs to be fasting after midnight on 02/08/2015.    Pt has office visit on 02/25/2015 @ 10:00am.  [facility] staff and husband are aware of appt. Changes.

## 2015-02-04 DIAGNOSIS — R41841 Cognitive communication deficit: Secondary | ICD-10-CM | POA: Diagnosis not present

## 2015-02-04 DIAGNOSIS — R2681 Unsteadiness on feet: Secondary | ICD-10-CM | POA: Diagnosis not present

## 2015-02-04 DIAGNOSIS — R296 Repeated falls: Secondary | ICD-10-CM | POA: Diagnosis not present

## 2015-02-04 DIAGNOSIS — Z9181 History of falling: Secondary | ICD-10-CM | POA: Diagnosis not present

## 2015-02-04 DIAGNOSIS — M6281 Muscle weakness (generalized): Secondary | ICD-10-CM | POA: Diagnosis not present

## 2015-02-07 ENCOUNTER — Ambulatory Visit: Payer: Medicare Other | Admitting: Gastroenterology

## 2015-02-07 DIAGNOSIS — Z9181 History of falling: Secondary | ICD-10-CM | POA: Diagnosis not present

## 2015-02-07 DIAGNOSIS — R296 Repeated falls: Secondary | ICD-10-CM | POA: Diagnosis not present

## 2015-02-07 DIAGNOSIS — M6281 Muscle weakness (generalized): Secondary | ICD-10-CM | POA: Diagnosis not present

## 2015-02-07 DIAGNOSIS — R41841 Cognitive communication deficit: Secondary | ICD-10-CM | POA: Diagnosis not present

## 2015-02-07 DIAGNOSIS — R2681 Unsteadiness on feet: Secondary | ICD-10-CM | POA: Diagnosis not present

## 2015-02-08 DIAGNOSIS — R41841 Cognitive communication deficit: Secondary | ICD-10-CM | POA: Diagnosis not present

## 2015-02-08 DIAGNOSIS — Z9181 History of falling: Secondary | ICD-10-CM | POA: Diagnosis not present

## 2015-02-08 DIAGNOSIS — R296 Repeated falls: Secondary | ICD-10-CM | POA: Diagnosis not present

## 2015-02-08 DIAGNOSIS — R2681 Unsteadiness on feet: Secondary | ICD-10-CM | POA: Diagnosis not present

## 2015-02-08 DIAGNOSIS — M6281 Muscle weakness (generalized): Secondary | ICD-10-CM | POA: Diagnosis not present

## 2015-02-09 ENCOUNTER — Ambulatory Visit (HOSPITAL_COMMUNITY)
Admission: RE | Admit: 2015-02-09 | Discharge: 2015-02-09 | Disposition: A | Discharge status: Home / Self Care | Payer: Medicare Other | Source: Ambulatory Visit | Attending: Internal Medicine | Admitting: Internal Medicine

## 2015-02-09 DIAGNOSIS — R161 Splenomegaly, not elsewhere classified: Secondary | ICD-10-CM | POA: Insufficient documentation

## 2015-02-09 DIAGNOSIS — R934 Abnormal findings on diagnostic imaging of urinary organs: Secondary | ICD-10-CM | POA: Insufficient documentation

## 2015-02-09 DIAGNOSIS — K8020 Calculus of gallbladder without cholecystitis without obstruction: Secondary | ICD-10-CM | POA: Insufficient documentation

## 2015-02-09 DIAGNOSIS — K7460 Unspecified cirrhosis of liver: Secondary | ICD-10-CM | POA: Insufficient documentation

## 2015-02-09 DIAGNOSIS — E119 Type 2 diabetes mellitus without complications: Secondary | ICD-10-CM | POA: Diagnosis not present

## 2015-02-09 DIAGNOSIS — R296 Repeated falls: Secondary | ICD-10-CM | POA: Diagnosis not present

## 2015-02-09 DIAGNOSIS — K2950 Unspecified chronic gastritis without bleeding: Secondary | ICD-10-CM | POA: Insufficient documentation

## 2015-02-09 DIAGNOSIS — Z9181 History of falling: Secondary | ICD-10-CM | POA: Diagnosis not present

## 2015-02-09 DIAGNOSIS — R41841 Cognitive communication deficit: Secondary | ICD-10-CM | POA: Diagnosis not present

## 2015-02-09 DIAGNOSIS — R2681 Unsteadiness on feet: Secondary | ICD-10-CM | POA: Diagnosis not present

## 2015-02-09 DIAGNOSIS — M6281 Muscle weakness (generalized): Secondary | ICD-10-CM | POA: Diagnosis not present

## 2015-02-10 DIAGNOSIS — R41841 Cognitive communication deficit: Secondary | ICD-10-CM | POA: Diagnosis not present

## 2015-02-10 DIAGNOSIS — R2681 Unsteadiness on feet: Secondary | ICD-10-CM | POA: Diagnosis not present

## 2015-02-10 DIAGNOSIS — R296 Repeated falls: Secondary | ICD-10-CM | POA: Diagnosis not present

## 2015-02-10 DIAGNOSIS — Z9181 History of falling: Secondary | ICD-10-CM | POA: Diagnosis not present

## 2015-02-10 DIAGNOSIS — M6281 Muscle weakness (generalized): Secondary | ICD-10-CM | POA: Diagnosis not present

## 2015-02-11 DIAGNOSIS — R296 Repeated falls: Secondary | ICD-10-CM | POA: Diagnosis not present

## 2015-02-11 DIAGNOSIS — M6281 Muscle weakness (generalized): Secondary | ICD-10-CM | POA: Diagnosis not present

## 2015-02-11 DIAGNOSIS — R2681 Unsteadiness on feet: Secondary | ICD-10-CM | POA: Diagnosis not present

## 2015-02-11 DIAGNOSIS — Z9181 History of falling: Secondary | ICD-10-CM | POA: Diagnosis not present

## 2015-02-11 DIAGNOSIS — R41841 Cognitive communication deficit: Secondary | ICD-10-CM | POA: Diagnosis not present

## 2015-02-14 DIAGNOSIS — R2681 Unsteadiness on feet: Secondary | ICD-10-CM | POA: Diagnosis not present

## 2015-02-14 DIAGNOSIS — R41841 Cognitive communication deficit: Secondary | ICD-10-CM | POA: Diagnosis not present

## 2015-02-14 DIAGNOSIS — R296 Repeated falls: Secondary | ICD-10-CM | POA: Diagnosis not present

## 2015-02-14 DIAGNOSIS — M6281 Muscle weakness (generalized): Secondary | ICD-10-CM | POA: Diagnosis not present

## 2015-02-14 DIAGNOSIS — Z9181 History of falling: Secondary | ICD-10-CM | POA: Diagnosis not present

## 2015-02-15 DIAGNOSIS — R2681 Unsteadiness on feet: Secondary | ICD-10-CM | POA: Diagnosis not present

## 2015-02-15 DIAGNOSIS — R296 Repeated falls: Secondary | ICD-10-CM | POA: Diagnosis not present

## 2015-02-15 DIAGNOSIS — Z9181 History of falling: Secondary | ICD-10-CM | POA: Diagnosis not present

## 2015-02-15 DIAGNOSIS — M6281 Muscle weakness (generalized): Secondary | ICD-10-CM | POA: Diagnosis not present

## 2015-02-15 DIAGNOSIS — R41841 Cognitive communication deficit: Secondary | ICD-10-CM | POA: Diagnosis not present

## 2015-02-16 DIAGNOSIS — R296 Repeated falls: Secondary | ICD-10-CM | POA: Diagnosis not present

## 2015-02-16 DIAGNOSIS — M6281 Muscle weakness (generalized): Secondary | ICD-10-CM | POA: Diagnosis not present

## 2015-02-16 DIAGNOSIS — R2681 Unsteadiness on feet: Secondary | ICD-10-CM | POA: Diagnosis not present

## 2015-02-16 DIAGNOSIS — Z9181 History of falling: Secondary | ICD-10-CM | POA: Diagnosis not present

## 2015-02-16 DIAGNOSIS — R41841 Cognitive communication deficit: Secondary | ICD-10-CM | POA: Diagnosis not present

## 2015-02-17 DIAGNOSIS — R296 Repeated falls: Secondary | ICD-10-CM | POA: Diagnosis not present

## 2015-02-17 DIAGNOSIS — Z9181 History of falling: Secondary | ICD-10-CM | POA: Diagnosis not present

## 2015-02-17 DIAGNOSIS — R41841 Cognitive communication deficit: Secondary | ICD-10-CM | POA: Diagnosis not present

## 2015-02-17 DIAGNOSIS — M6281 Muscle weakness (generalized): Secondary | ICD-10-CM | POA: Diagnosis not present

## 2015-02-17 DIAGNOSIS — R2681 Unsteadiness on feet: Secondary | ICD-10-CM | POA: Diagnosis not present

## 2015-02-18 DIAGNOSIS — R2681 Unsteadiness on feet: Secondary | ICD-10-CM | POA: Diagnosis not present

## 2015-02-18 DIAGNOSIS — R296 Repeated falls: Secondary | ICD-10-CM | POA: Diagnosis not present

## 2015-02-18 DIAGNOSIS — R070 Pain in throat: Secondary | ICD-10-CM | POA: Diagnosis not present

## 2015-02-18 DIAGNOSIS — Z9181 History of falling: Secondary | ICD-10-CM | POA: Diagnosis not present

## 2015-02-18 DIAGNOSIS — R05 Cough: Secondary | ICD-10-CM | POA: Diagnosis not present

## 2015-02-18 DIAGNOSIS — R41841 Cognitive communication deficit: Secondary | ICD-10-CM | POA: Diagnosis not present

## 2015-02-18 DIAGNOSIS — M6281 Muscle weakness (generalized): Secondary | ICD-10-CM | POA: Diagnosis not present

## 2015-02-18 DIAGNOSIS — J3089 Other allergic rhinitis: Secondary | ICD-10-CM | POA: Diagnosis not present

## 2015-02-21 DIAGNOSIS — R41841 Cognitive communication deficit: Secondary | ICD-10-CM | POA: Diagnosis not present

## 2015-02-21 DIAGNOSIS — Z9181 History of falling: Secondary | ICD-10-CM | POA: Diagnosis not present

## 2015-02-21 DIAGNOSIS — M6281 Muscle weakness (generalized): Secondary | ICD-10-CM | POA: Diagnosis not present

## 2015-02-21 DIAGNOSIS — R296 Repeated falls: Secondary | ICD-10-CM | POA: Diagnosis not present

## 2015-02-21 DIAGNOSIS — R2681 Unsteadiness on feet: Secondary | ICD-10-CM | POA: Diagnosis not present

## 2015-02-22 DIAGNOSIS — R2681 Unsteadiness on feet: Secondary | ICD-10-CM | POA: Diagnosis not present

## 2015-02-22 DIAGNOSIS — R296 Repeated falls: Secondary | ICD-10-CM | POA: Diagnosis not present

## 2015-02-22 DIAGNOSIS — R41841 Cognitive communication deficit: Secondary | ICD-10-CM | POA: Diagnosis not present

## 2015-02-22 DIAGNOSIS — Z9181 History of falling: Secondary | ICD-10-CM | POA: Diagnosis not present

## 2015-02-22 DIAGNOSIS — M6281 Muscle weakness (generalized): Secondary | ICD-10-CM | POA: Diagnosis not present

## 2015-02-23 DIAGNOSIS — Z9181 History of falling: Secondary | ICD-10-CM | POA: Diagnosis not present

## 2015-02-23 DIAGNOSIS — R2681 Unsteadiness on feet: Secondary | ICD-10-CM | POA: Diagnosis not present

## 2015-02-23 DIAGNOSIS — R296 Repeated falls: Secondary | ICD-10-CM | POA: Diagnosis not present

## 2015-02-23 DIAGNOSIS — M6281 Muscle weakness (generalized): Secondary | ICD-10-CM | POA: Diagnosis not present

## 2015-02-23 DIAGNOSIS — R41841 Cognitive communication deficit: Secondary | ICD-10-CM | POA: Diagnosis not present

## 2015-02-24 DIAGNOSIS — R2681 Unsteadiness on feet: Secondary | ICD-10-CM | POA: Diagnosis not present

## 2015-02-24 DIAGNOSIS — R296 Repeated falls: Secondary | ICD-10-CM | POA: Diagnosis not present

## 2015-02-24 DIAGNOSIS — Z9181 History of falling: Secondary | ICD-10-CM | POA: Diagnosis not present

## 2015-02-24 DIAGNOSIS — R41841 Cognitive communication deficit: Secondary | ICD-10-CM | POA: Diagnosis not present

## 2015-02-24 DIAGNOSIS — M6281 Muscle weakness (generalized): Secondary | ICD-10-CM | POA: Diagnosis not present

## 2015-02-25 ENCOUNTER — Ambulatory Visit (INDEPENDENT_AMBULATORY_CARE_PROVIDER_SITE_OTHER): Payer: Medicare Other | Admitting: Gastroenterology

## 2015-02-25 ENCOUNTER — Encounter: Payer: Self-pay | Admitting: Gastroenterology

## 2015-02-25 VITALS — BP 121/64 | HR 65 | Temp 97.1°F | Ht 68.0 in | Wt 233.2 lb

## 2015-02-25 DIAGNOSIS — Z860100 Personal history of colon polyps, unspecified: Secondary | ICD-10-CM

## 2015-02-25 DIAGNOSIS — R2681 Unsteadiness on feet: Secondary | ICD-10-CM | POA: Diagnosis not present

## 2015-02-25 DIAGNOSIS — R296 Repeated falls: Secondary | ICD-10-CM | POA: Diagnosis not present

## 2015-02-25 DIAGNOSIS — K7581 Nonalcoholic steatohepatitis (NASH): Secondary | ICD-10-CM | POA: Diagnosis not present

## 2015-02-25 DIAGNOSIS — I10 Essential (primary) hypertension: Secondary | ICD-10-CM | POA: Diagnosis not present

## 2015-02-25 DIAGNOSIS — D649 Anemia, unspecified: Secondary | ICD-10-CM | POA: Diagnosis not present

## 2015-02-25 DIAGNOSIS — R41841 Cognitive communication deficit: Secondary | ICD-10-CM | POA: Diagnosis not present

## 2015-02-25 DIAGNOSIS — R05 Cough: Secondary | ICD-10-CM | POA: Diagnosis not present

## 2015-02-25 DIAGNOSIS — K7290 Hepatic failure, unspecified without coma: Secondary | ICD-10-CM | POA: Diagnosis not present

## 2015-02-25 DIAGNOSIS — E119 Type 2 diabetes mellitus without complications: Secondary | ICD-10-CM | POA: Diagnosis not present

## 2015-02-25 DIAGNOSIS — K7469 Other cirrhosis of liver: Secondary | ICD-10-CM | POA: Diagnosis not present

## 2015-02-25 DIAGNOSIS — Z79899 Other long term (current) drug therapy: Secondary | ICD-10-CM | POA: Diagnosis not present

## 2015-02-25 DIAGNOSIS — K7460 Unspecified cirrhosis of liver: Secondary | ICD-10-CM

## 2015-02-25 DIAGNOSIS — Z9181 History of falling: Secondary | ICD-10-CM | POA: Diagnosis not present

## 2015-02-25 DIAGNOSIS — M6281 Muscle weakness (generalized): Secondary | ICD-10-CM | POA: Diagnosis not present

## 2015-02-25 NOTE — Patient Instructions (Signed)
We will see you back in 6 months.   The lactulose can be titrated as needed to achieve 3 soft bowel movements daily. This means you may not have to do 3-4 doses a day if you are already having 3 soft bowel movements.

## 2015-02-25 NOTE — Progress Notes (Signed)
Referring Provider: Sharilyn Sites, MD Primary Care Physician:  Purvis Kilts, MD  Primary GI: Dr. Gala Romney   Chief Complaint  Patient presents with  . Follow-up    HPI:   [name] [name] is a 74 y.o. female presenting today with a history of NASH cirrhosis. Recent episodes of biliary pancreatitis. Known cholelithiasis. Saw Dr. Eugenia Pancoast at [facility].  She may have a surgical mortality risk of upwards of 30%. Conservative  management continuing Actigall chronically.   US abdomen up to date for [facility] screening, next due in Sept 2016. No confusion, abdominal pain. No reflux symptoms. No N/V. No rectal bleeding. BM 3-4 times per day. Due for surveillance colonoscopy now but would like to wait until she gets out of the nursing home.   Past Medical History  Diagnosis Date  . CAD in native artery   . Thrombocytopenia, unspecified   . Unspecified hereditary and idiopathic peripheral neuropathy   . Unspecified fall   . Other and unspecified hyperlipidemia   . Unspecified essential hypertension   . Obesity, unspecified   . Personal history of neurosis   . Intrinsic asthma, unspecified   . Allergic rhinitis, cause unspecified   . Cirrhosis of liver     NASH, afp on 06/17/12 =3.7, per pt she had hep B vaccines in 1996, hep A in process.   . Colon adenomas 03/08/10    tcs by Dr. Gala Romney  . Hyperplastic polyps of stomach 03/08/10    tcs by Dr. Dudley Major  . Chronic gastritis 03/09/11    egd by Dr. Gala Romney  . History of hemorrhoids 03/08/10    tcs- internal and external  . Diverticula of colon 03/08/10    L side  . Esophagitis, erosive 03/08/10  . GERD (gastroesophageal reflux disease)   . Wears glasses   . Type II or unspecified type diabetes mellitus without mention of complication, not stated as uncontrolled   . Vertigo   . Pancreatitis   . Bilateral leg weakness   . Thrombocytopenia 04/12/2014  . Pancytopenia, acquired 04/12/2014  . Cirrhosis   . Abnormal EKG     hx of left anterior fasicular  block on 04-09-13 ekg epic  . Chronic shoulder pain     Past Surgical History  Procedure Laterality Date  . Hysterectomy and btl      s/p  . Tumor excision  2003    rt arm and left foot  . Colonoscopy  03/08/10    Dr. [name]-->ext/int hemorrhoids, anal paipilla, rectal polyps, desc polyps, cecal polyp, left-sided diverticula. suboptiomal prep. next TCS 02/2015. multiple adenomas  . Esophagogastroduodenoscopy  03/08/10    Dr. [name] erosive RE, small hh, antral erosions, small 1cm are of mucosal indentation along gastric body of doubtful significance, cystic nodularity of hypophyarynx, base of tongue, base of epiglottis. benign gastric biopsies  . Tubal ligation    . Esophagogastroduodenoscopy  10/08/2011    Dr. [name] esophageal varices, antral erosion. next egd 09/2013  . Foot surgery  2003    lt foot  . Eye surgery      cataracts bilateral  . Orif humerus fracture Right 02/17/2013    Procedure: OPEN REDUCTION INTERNAL FIXATION (ORIF) RIGHT PROXIMAL HUMERUS FRACTURE;  Surgeon: [name], MD;  Location: [place];  Service: Orthopedics;  Laterality: Right;  . Partial gastrectomy      family denies  . Esophagogastroduodenoscopy N/A 02/17/2014    Dr. Gala Romney: portal gastropathy, no varices. Due for surveillance 2017  . Breast surgery  Right few yrs ago    benign area removed  . Abdominal hysterectomy    . Cardiac catheterization  02/25/2007    Dr. [name]:  normal LV systolic function, mild irregularities of LAD    Current Outpatient Prescriptions  Medication Sig Dispense Refill  . ALPRAZolam (XANAX) 0.5 MG tablet Take 1 tablet (0.5 mg total) by mouth 3 (three) times daily as needed for anxiety. 20 tablet 0  . aspirin EC 325 MG EC tablet Take 1 tablet (325 mg total) by mouth every morning. 30 tablet 0  . budesonide-formoterol (SYMBICORT) 160-4.5 MCG/ACT inhaler Inhale 2 puffs into the lungs at bedtime.     . enalapril (VASOTEC) 5 MG tablet Take 1 tablet by mouth daily.    Marland Kitchen ESTRACE  VAGINAL 0.1 MG/GM vaginal cream Place 1 Applicatorful vaginally every other day. At  bedtime    . furosemide (LASIX) 20 MG tablet Take 1 tablet (20 mg total) by mouth every morning. 30 tablet 5  . insulin aspart (NOVOLOG FLEXPEN) 100 UNIT/ML FlexPen Inject 2-10 Units into the skin 3 (three) times daily with meals. Sliding Scale: 200-250= 2 units 251-300= 4 units 301-350= 6 units 351-400= 8 units If levels are over 400, GIVE 10 UNITS AND CALL MD    . insulin detemir (LEVEMIR) 100 UNIT/ML injection Inject 60 Units into the skin at bedtime.     Marland Kitchen lactulose (CHRONULAC) 10 GM/15ML solution Take 60 mLs by mouth 2 (two) times daily.  0  . meclizine (ANTIVERT) 25 MG tablet Take 25 mg by mouth 3 (three) times daily as needed for dizziness or nausea.     . Multiple Vitamins-Minerals (CENTRUM SILVER PO) Take 1 tablet by mouth daily.     Marland Kitchen NEXIUM 40 MG capsule Take 40 mg by mouth every morning.     . ondansetron (ZOFRAN ODT) 4 MG disintegrating tablet Take 1 tablet (4 mg total) by mouth every 8 (eight) hours as needed for nausea or vomiting. 20 tablet 0  . ondansetron (ZOFRAN) 4 MG tablet Take 1 tablet (4 mg total) by mouth every 6 (six) hours as needed for nausea. 20 tablet 0  . OxyCODONE (OXYCONTIN) 10 mg T12A 12 hr tablet Take 1 tablet (10 mg total) by mouth 2 (two) times daily as needed (severe pain). 20 tablet 0  . pregabalin (LYRICA) 100 MG capsule Take 100 mg by mouth 2 (two) times daily.    . quinapril (ACCUPRIL) 5 MG tablet Take 5 mg by mouth at bedtime.    . rifaximin (XIFAXAN) 550 MG TABS tablet Take 1 tablet (550 mg total) by mouth 2 (two) times daily. 60 tablet 1  . spironolactone (ALDACTONE) 50 MG tablet Take 1 tablet (50 mg total) by mouth every morning. 30 tablet 5  . triamcinolone cream (KENALOG) 0.1 % Apply 1 application topically daily as needed (for irritation).     . ursodiol (URSO FORTE) 500 MG tablet Take 500 mg by mouth 2 (two) times daily.     No current facility-administered  medications for this visit.    Allergies as of 02/25/2015 - Review Complete 02/25/2015  Allergen Reaction Noted  . Morphine and related Itching 04/26/2013  . Compazine [prochlorperazine edisylate] Anxiety 06/04/2012    Family History  Problem Relation Age of Onset  . Coronary artery disease      FH  . Diabetes      FH  . Heart disease Mother   . Colon cancer Neg Hx     History   Social  History  . Marital Status: Married    Spouse Name: N/A  . Number of Children: 3  . Years of Education: N/A   Social History Main Topics  . Smoking status: Never Smoker   . Smokeless tobacco: Never Used  . Alcohol Use: No  . Drug Use: No  . Sexual Activity: Not Currently    Birth Control/ Protection: Surgical   Other Topics Concern  . None   Social History Narrative    Review of Systems: As mentioned in HPI  Physical Exam: BP 121/64 mmHg  Pulse 65  Temp(Src) 97.1 F (36.2 C)  Ht 5\' 8"  (1.727 m)  Wt 233 lb 3.2 oz (105.779 kg)  BMI 35.47 kg/m2 General:   Alert and oriented. No distress noted. Pleasant and cooperative.  Head:  Normocephalic and atraumatic. Eyes:  Conjuctiva clear without scleral icterus. Mouth:  Oral mucosa pink and moist. Good dentition. No lesions. Heart:  S1, S2 present without murmurs, rubs, or gallops. Regular rate and rhythm. Abdomen:  Limited exam with patient in wheelchair. +BS, soft, non-tender and non-distended. No rebound or guarding. Msk:  Symmetrical without gross deformities. Normal posture. Extremities:  Without edema. Neurologic:  Alert and  oriented x4;  grossly normal neurologically. Skin:  Intact without significant lesions or rashes. Psych:  Alert and cooperative. Normal mood and affect.

## 2015-02-26 DIAGNOSIS — R05 Cough: Secondary | ICD-10-CM | POA: Diagnosis not present

## 2015-02-26 DIAGNOSIS — K7469 Other cirrhosis of liver: Secondary | ICD-10-CM | POA: Diagnosis not present

## 2015-02-28 DIAGNOSIS — L03032 Cellulitis of left toe: Secondary | ICD-10-CM | POA: Diagnosis not present

## 2015-02-28 DIAGNOSIS — L600 Ingrowing nail: Secondary | ICD-10-CM | POA: Diagnosis not present

## 2015-02-28 DIAGNOSIS — Z9181 History of falling: Secondary | ICD-10-CM | POA: Diagnosis not present

## 2015-02-28 DIAGNOSIS — M79675 Pain in left toe(s): Secondary | ICD-10-CM | POA: Diagnosis not present

## 2015-02-28 DIAGNOSIS — R2681 Unsteadiness on feet: Secondary | ICD-10-CM | POA: Diagnosis not present

## 2015-02-28 DIAGNOSIS — B351 Tinea unguium: Secondary | ICD-10-CM | POA: Diagnosis not present

## 2015-02-28 DIAGNOSIS — R41841 Cognitive communication deficit: Secondary | ICD-10-CM | POA: Diagnosis not present

## 2015-02-28 DIAGNOSIS — R296 Repeated falls: Secondary | ICD-10-CM | POA: Diagnosis not present

## 2015-02-28 DIAGNOSIS — M6281 Muscle weakness (generalized): Secondary | ICD-10-CM | POA: Diagnosis not present

## 2015-03-01 ENCOUNTER — Encounter: Payer: Self-pay | Admitting: Internal Medicine

## 2015-03-01 DIAGNOSIS — R41841 Cognitive communication deficit: Secondary | ICD-10-CM | POA: Diagnosis not present

## 2015-03-01 DIAGNOSIS — S91109A Unspecified open wound of unspecified toe(s) without damage to nail, initial encounter: Secondary | ICD-10-CM | POA: Diagnosis not present

## 2015-03-01 DIAGNOSIS — R2681 Unsteadiness on feet: Secondary | ICD-10-CM | POA: Diagnosis not present

## 2015-03-01 DIAGNOSIS — R296 Repeated falls: Secondary | ICD-10-CM | POA: Diagnosis not present

## 2015-03-01 DIAGNOSIS — R05 Cough: Secondary | ICD-10-CM | POA: Diagnosis not present

## 2015-03-01 DIAGNOSIS — K7290 Hepatic failure, unspecified without coma: Secondary | ICD-10-CM | POA: Diagnosis not present

## 2015-03-01 DIAGNOSIS — Z9181 History of falling: Secondary | ICD-10-CM | POA: Diagnosis not present

## 2015-03-01 DIAGNOSIS — M6281 Muscle weakness (generalized): Secondary | ICD-10-CM | POA: Diagnosis not present

## 2015-03-01 NOTE — Assessment & Plan Note (Signed)
Due for surveillance secondary to history of adenomas. Will need this year; she would like to wait until her next appt to discuss this. No concerning lower GI symptoms.

## 2015-03-01 NOTE — Assessment & Plan Note (Signed)
Doing well, due for hepatoma screening in Sept 2016. EGD for screening in 2017. Continue to titrate lactulose to achieve 2-3 soft bowel movements daily. Return in 6 months.

## 2015-03-01 NOTE — Progress Notes (Signed)
CC'ED TO PCP 

## 2015-03-02 DIAGNOSIS — Z9181 History of falling: Secondary | ICD-10-CM | POA: Diagnosis not present

## 2015-03-02 DIAGNOSIS — R296 Repeated falls: Secondary | ICD-10-CM | POA: Diagnosis not present

## 2015-03-02 DIAGNOSIS — R41841 Cognitive communication deficit: Secondary | ICD-10-CM | POA: Diagnosis not present

## 2015-03-02 DIAGNOSIS — R2681 Unsteadiness on feet: Secondary | ICD-10-CM | POA: Diagnosis not present

## 2015-03-02 DIAGNOSIS — M6281 Muscle weakness (generalized): Secondary | ICD-10-CM | POA: Diagnosis not present

## 2015-03-06 DIAGNOSIS — R05 Cough: Secondary | ICD-10-CM | POA: Diagnosis not present

## 2015-03-06 DIAGNOSIS — R070 Pain in throat: Secondary | ICD-10-CM | POA: Diagnosis not present

## 2015-03-19 DIAGNOSIS — S50811A Abrasion of right forearm, initial encounter: Secondary | ICD-10-CM | POA: Diagnosis not present

## 2015-03-24 DIAGNOSIS — Z79899 Other long term (current) drug therapy: Secondary | ICD-10-CM | POA: Diagnosis not present

## 2015-03-24 DIAGNOSIS — K7460 Unspecified cirrhosis of liver: Secondary | ICD-10-CM | POA: Diagnosis not present

## 2015-03-24 DIAGNOSIS — E7220 Disorder of urea cycle metabolism, unspecified: Secondary | ICD-10-CM | POA: Diagnosis not present

## 2015-03-24 DIAGNOSIS — E118 Type 2 diabetes mellitus with unspecified complications: Secondary | ICD-10-CM | POA: Diagnosis not present

## 2015-03-24 DIAGNOSIS — I10 Essential (primary) hypertension: Secondary | ICD-10-CM | POA: Diagnosis not present

## 2015-03-24 DIAGNOSIS — K5900 Constipation, unspecified: Secondary | ICD-10-CM | POA: Diagnosis not present

## 2015-03-24 DIAGNOSIS — D649 Anemia, unspecified: Secondary | ICD-10-CM | POA: Diagnosis not present

## 2015-03-25 DIAGNOSIS — I10 Essential (primary) hypertension: Secondary | ICD-10-CM | POA: Diagnosis not present

## 2015-03-25 DIAGNOSIS — K219 Gastro-esophageal reflux disease without esophagitis: Secondary | ICD-10-CM | POA: Diagnosis not present

## 2015-03-25 DIAGNOSIS — K7469 Other cirrhosis of liver: Secondary | ICD-10-CM | POA: Diagnosis not present

## 2015-03-25 DIAGNOSIS — E0841 Diabetes mellitus due to underlying condition with diabetic mononeuropathy: Secondary | ICD-10-CM | POA: Diagnosis not present

## 2015-03-27 DIAGNOSIS — K7581 Nonalcoholic steatohepatitis (NASH): Secondary | ICD-10-CM | POA: Diagnosis not present

## 2015-03-27 DIAGNOSIS — E1142 Type 2 diabetes mellitus with diabetic polyneuropathy: Secondary | ICD-10-CM | POA: Diagnosis not present

## 2015-03-27 DIAGNOSIS — I2510 Atherosclerotic heart disease of native coronary artery without angina pectoris: Secondary | ICD-10-CM | POA: Diagnosis not present

## 2015-03-27 DIAGNOSIS — Z794 Long term (current) use of insulin: Secondary | ICD-10-CM | POA: Diagnosis not present

## 2015-03-27 DIAGNOSIS — K7460 Unspecified cirrhosis of liver: Secondary | ICD-10-CM | POA: Diagnosis not present

## 2015-03-27 DIAGNOSIS — E1140 Type 2 diabetes mellitus with diabetic neuropathy, unspecified: Secondary | ICD-10-CM | POA: Diagnosis not present

## 2015-03-27 DIAGNOSIS — I10 Essential (primary) hypertension: Secondary | ICD-10-CM | POA: Diagnosis not present

## 2015-03-27 DIAGNOSIS — R296 Repeated falls: Secondary | ICD-10-CM | POA: Diagnosis not present

## 2015-03-27 DIAGNOSIS — Z9181 History of falling: Secondary | ICD-10-CM | POA: Diagnosis not present

## 2015-03-27 DIAGNOSIS — K5790 Diverticulosis of intestine, part unspecified, without perforation or abscess without bleeding: Secondary | ICD-10-CM | POA: Diagnosis not present

## 2015-03-27 DIAGNOSIS — Z87440 Personal history of urinary (tract) infections: Secondary | ICD-10-CM | POA: Diagnosis not present

## 2015-03-27 DIAGNOSIS — Z5181 Encounter for therapeutic drug level monitoring: Secondary | ICD-10-CM | POA: Diagnosis not present

## 2015-03-28 DIAGNOSIS — Z5181 Encounter for therapeutic drug level monitoring: Secondary | ICD-10-CM | POA: Diagnosis not present

## 2015-03-28 DIAGNOSIS — E1140 Type 2 diabetes mellitus with diabetic neuropathy, unspecified: Secondary | ICD-10-CM | POA: Diagnosis not present

## 2015-03-28 DIAGNOSIS — K7460 Unspecified cirrhosis of liver: Secondary | ICD-10-CM | POA: Diagnosis not present

## 2015-03-28 DIAGNOSIS — Z794 Long term (current) use of insulin: Secondary | ICD-10-CM | POA: Diagnosis not present

## 2015-03-28 DIAGNOSIS — Z9181 History of falling: Secondary | ICD-10-CM | POA: Diagnosis not present

## 2015-03-28 DIAGNOSIS — Z87440 Personal history of urinary (tract) infections: Secondary | ICD-10-CM | POA: Diagnosis not present

## 2015-03-28 DIAGNOSIS — K7581 Nonalcoholic steatohepatitis (NASH): Secondary | ICD-10-CM | POA: Diagnosis not present

## 2015-03-28 DIAGNOSIS — E1142 Type 2 diabetes mellitus with diabetic polyneuropathy: Secondary | ICD-10-CM | POA: Diagnosis not present

## 2015-03-28 DIAGNOSIS — M79674 Pain in right toe(s): Secondary | ICD-10-CM | POA: Diagnosis not present

## 2015-03-28 DIAGNOSIS — K5790 Diverticulosis of intestine, part unspecified, without perforation or abscess without bleeding: Secondary | ICD-10-CM | POA: Diagnosis not present

## 2015-03-28 DIAGNOSIS — R296 Repeated falls: Secondary | ICD-10-CM | POA: Diagnosis not present

## 2015-03-28 DIAGNOSIS — L03031 Cellulitis of right toe: Secondary | ICD-10-CM | POA: Diagnosis not present

## 2015-03-28 DIAGNOSIS — I10 Essential (primary) hypertension: Secondary | ICD-10-CM | POA: Diagnosis not present

## 2015-03-28 DIAGNOSIS — I2510 Atherosclerotic heart disease of native coronary artery without angina pectoris: Secondary | ICD-10-CM | POA: Diagnosis not present

## 2015-03-29 DIAGNOSIS — Z5181 Encounter for therapeutic drug level monitoring: Secondary | ICD-10-CM | POA: Diagnosis not present

## 2015-03-29 DIAGNOSIS — E1140 Type 2 diabetes mellitus with diabetic neuropathy, unspecified: Secondary | ICD-10-CM | POA: Diagnosis not present

## 2015-03-29 DIAGNOSIS — I2510 Atherosclerotic heart disease of native coronary artery without angina pectoris: Secondary | ICD-10-CM | POA: Diagnosis not present

## 2015-03-29 DIAGNOSIS — K7460 Unspecified cirrhosis of liver: Secondary | ICD-10-CM | POA: Diagnosis not present

## 2015-03-29 DIAGNOSIS — Z9181 History of falling: Secondary | ICD-10-CM | POA: Diagnosis not present

## 2015-03-29 DIAGNOSIS — E1142 Type 2 diabetes mellitus with diabetic polyneuropathy: Secondary | ICD-10-CM | POA: Diagnosis not present

## 2015-03-29 DIAGNOSIS — R296 Repeated falls: Secondary | ICD-10-CM | POA: Diagnosis not present

## 2015-03-29 DIAGNOSIS — Z794 Long term (current) use of insulin: Secondary | ICD-10-CM | POA: Diagnosis not present

## 2015-03-29 DIAGNOSIS — K7581 Nonalcoholic steatohepatitis (NASH): Secondary | ICD-10-CM | POA: Diagnosis not present

## 2015-03-29 DIAGNOSIS — Z87440 Personal history of urinary (tract) infections: Secondary | ICD-10-CM | POA: Diagnosis not present

## 2015-03-29 DIAGNOSIS — K5790 Diverticulosis of intestine, part unspecified, without perforation or abscess without bleeding: Secondary | ICD-10-CM | POA: Diagnosis not present

## 2015-03-29 DIAGNOSIS — I10 Essential (primary) hypertension: Secondary | ICD-10-CM | POA: Diagnosis not present

## 2015-03-30 DIAGNOSIS — Z87440 Personal history of urinary (tract) infections: Secondary | ICD-10-CM | POA: Diagnosis not present

## 2015-03-30 DIAGNOSIS — Z9181 History of falling: Secondary | ICD-10-CM | POA: Diagnosis not present

## 2015-03-30 DIAGNOSIS — K7460 Unspecified cirrhosis of liver: Secondary | ICD-10-CM | POA: Diagnosis not present

## 2015-03-30 DIAGNOSIS — I10 Essential (primary) hypertension: Secondary | ICD-10-CM | POA: Diagnosis not present

## 2015-03-30 DIAGNOSIS — K7581 Nonalcoholic steatohepatitis (NASH): Secondary | ICD-10-CM | POA: Diagnosis not present

## 2015-03-30 DIAGNOSIS — Z5181 Encounter for therapeutic drug level monitoring: Secondary | ICD-10-CM | POA: Diagnosis not present

## 2015-03-30 DIAGNOSIS — I2510 Atherosclerotic heart disease of native coronary artery without angina pectoris: Secondary | ICD-10-CM | POA: Diagnosis not present

## 2015-03-30 DIAGNOSIS — Z794 Long term (current) use of insulin: Secondary | ICD-10-CM | POA: Diagnosis not present

## 2015-03-30 DIAGNOSIS — E1142 Type 2 diabetes mellitus with diabetic polyneuropathy: Secondary | ICD-10-CM | POA: Diagnosis not present

## 2015-03-30 DIAGNOSIS — K5790 Diverticulosis of intestine, part unspecified, without perforation or abscess without bleeding: Secondary | ICD-10-CM | POA: Diagnosis not present

## 2015-03-30 DIAGNOSIS — E1140 Type 2 diabetes mellitus with diabetic neuropathy, unspecified: Secondary | ICD-10-CM | POA: Diagnosis not present

## 2015-03-30 DIAGNOSIS — R296 Repeated falls: Secondary | ICD-10-CM | POA: Diagnosis not present

## 2015-03-31 DIAGNOSIS — Z1389 Encounter for screening for other disorder: Secondary | ICD-10-CM | POA: Diagnosis not present

## 2015-03-31 DIAGNOSIS — Z6838 Body mass index (BMI) 38.0-38.9, adult: Secondary | ICD-10-CM | POA: Diagnosis not present

## 2015-03-31 DIAGNOSIS — E063 Autoimmune thyroiditis: Secondary | ICD-10-CM | POA: Diagnosis not present

## 2015-03-31 DIAGNOSIS — E782 Mixed hyperlipidemia: Secondary | ICD-10-CM | POA: Diagnosis not present

## 2015-03-31 DIAGNOSIS — Z0001 Encounter for general adult medical examination with abnormal findings: Secondary | ICD-10-CM | POA: Diagnosis not present

## 2015-03-31 DIAGNOSIS — E1165 Type 2 diabetes mellitus with hyperglycemia: Secondary | ICD-10-CM | POA: Diagnosis not present

## 2015-04-01 DIAGNOSIS — Z87440 Personal history of urinary (tract) infections: Secondary | ICD-10-CM | POA: Diagnosis not present

## 2015-04-01 DIAGNOSIS — K7460 Unspecified cirrhosis of liver: Secondary | ICD-10-CM | POA: Diagnosis not present

## 2015-04-01 DIAGNOSIS — R296 Repeated falls: Secondary | ICD-10-CM | POA: Diagnosis not present

## 2015-04-01 DIAGNOSIS — K5790 Diverticulosis of intestine, part unspecified, without perforation or abscess without bleeding: Secondary | ICD-10-CM | POA: Diagnosis not present

## 2015-04-01 DIAGNOSIS — K7581 Nonalcoholic steatohepatitis (NASH): Secondary | ICD-10-CM | POA: Diagnosis not present

## 2015-04-01 DIAGNOSIS — E1140 Type 2 diabetes mellitus with diabetic neuropathy, unspecified: Secondary | ICD-10-CM | POA: Diagnosis not present

## 2015-04-01 DIAGNOSIS — Z9181 History of falling: Secondary | ICD-10-CM | POA: Diagnosis not present

## 2015-04-01 DIAGNOSIS — E1142 Type 2 diabetes mellitus with diabetic polyneuropathy: Secondary | ICD-10-CM | POA: Diagnosis not present

## 2015-04-01 DIAGNOSIS — I10 Essential (primary) hypertension: Secondary | ICD-10-CM | POA: Diagnosis not present

## 2015-04-01 DIAGNOSIS — Z5181 Encounter for therapeutic drug level monitoring: Secondary | ICD-10-CM | POA: Diagnosis not present

## 2015-04-01 DIAGNOSIS — I2510 Atherosclerotic heart disease of native coronary artery without angina pectoris: Secondary | ICD-10-CM | POA: Diagnosis not present

## 2015-04-01 DIAGNOSIS — Z794 Long term (current) use of insulin: Secondary | ICD-10-CM | POA: Diagnosis not present

## 2015-04-05 DIAGNOSIS — Z9181 History of falling: Secondary | ICD-10-CM | POA: Diagnosis not present

## 2015-04-05 DIAGNOSIS — R296 Repeated falls: Secondary | ICD-10-CM | POA: Diagnosis not present

## 2015-04-05 DIAGNOSIS — Z5181 Encounter for therapeutic drug level monitoring: Secondary | ICD-10-CM | POA: Diagnosis not present

## 2015-04-05 DIAGNOSIS — K7581 Nonalcoholic steatohepatitis (NASH): Secondary | ICD-10-CM | POA: Diagnosis not present

## 2015-04-05 DIAGNOSIS — Z794 Long term (current) use of insulin: Secondary | ICD-10-CM | POA: Diagnosis not present

## 2015-04-05 DIAGNOSIS — K5790 Diverticulosis of intestine, part unspecified, without perforation or abscess without bleeding: Secondary | ICD-10-CM | POA: Diagnosis not present

## 2015-04-05 DIAGNOSIS — E1140 Type 2 diabetes mellitus with diabetic neuropathy, unspecified: Secondary | ICD-10-CM | POA: Diagnosis not present

## 2015-04-05 DIAGNOSIS — E1142 Type 2 diabetes mellitus with diabetic polyneuropathy: Secondary | ICD-10-CM | POA: Diagnosis not present

## 2015-04-05 DIAGNOSIS — Z87440 Personal history of urinary (tract) infections: Secondary | ICD-10-CM | POA: Diagnosis not present

## 2015-04-05 DIAGNOSIS — K7460 Unspecified cirrhosis of liver: Secondary | ICD-10-CM | POA: Diagnosis not present

## 2015-04-05 DIAGNOSIS — I10 Essential (primary) hypertension: Secondary | ICD-10-CM | POA: Diagnosis not present

## 2015-04-05 DIAGNOSIS — I2510 Atherosclerotic heart disease of native coronary artery without angina pectoris: Secondary | ICD-10-CM | POA: Diagnosis not present

## 2015-04-06 DIAGNOSIS — E1140 Type 2 diabetes mellitus with diabetic neuropathy, unspecified: Secondary | ICD-10-CM | POA: Diagnosis not present

## 2015-04-06 DIAGNOSIS — Z5181 Encounter for therapeutic drug level monitoring: Secondary | ICD-10-CM | POA: Diagnosis not present

## 2015-04-06 DIAGNOSIS — K7460 Unspecified cirrhosis of liver: Secondary | ICD-10-CM | POA: Diagnosis not present

## 2015-04-06 DIAGNOSIS — E1142 Type 2 diabetes mellitus with diabetic polyneuropathy: Secondary | ICD-10-CM | POA: Diagnosis not present

## 2015-04-06 DIAGNOSIS — K5790 Diverticulosis of intestine, part unspecified, without perforation or abscess without bleeding: Secondary | ICD-10-CM | POA: Diagnosis not present

## 2015-04-06 DIAGNOSIS — Z794 Long term (current) use of insulin: Secondary | ICD-10-CM | POA: Diagnosis not present

## 2015-04-06 DIAGNOSIS — I10 Essential (primary) hypertension: Secondary | ICD-10-CM | POA: Diagnosis not present

## 2015-04-06 DIAGNOSIS — Z9181 History of falling: Secondary | ICD-10-CM | POA: Diagnosis not present

## 2015-04-06 DIAGNOSIS — R296 Repeated falls: Secondary | ICD-10-CM | POA: Diagnosis not present

## 2015-04-06 DIAGNOSIS — K7581 Nonalcoholic steatohepatitis (NASH): Secondary | ICD-10-CM | POA: Diagnosis not present

## 2015-04-06 DIAGNOSIS — Z87440 Personal history of urinary (tract) infections: Secondary | ICD-10-CM | POA: Diagnosis not present

## 2015-04-06 DIAGNOSIS — I2510 Atherosclerotic heart disease of native coronary artery without angina pectoris: Secondary | ICD-10-CM | POA: Diagnosis not present

## 2015-04-07 DIAGNOSIS — E1142 Type 2 diabetes mellitus with diabetic polyneuropathy: Secondary | ICD-10-CM | POA: Diagnosis not present

## 2015-04-07 DIAGNOSIS — E1140 Type 2 diabetes mellitus with diabetic neuropathy, unspecified: Secondary | ICD-10-CM | POA: Diagnosis not present

## 2015-04-07 DIAGNOSIS — Z5181 Encounter for therapeutic drug level monitoring: Secondary | ICD-10-CM | POA: Diagnosis not present

## 2015-04-07 DIAGNOSIS — Z9181 History of falling: Secondary | ICD-10-CM | POA: Diagnosis not present

## 2015-04-07 DIAGNOSIS — I10 Essential (primary) hypertension: Secondary | ICD-10-CM | POA: Diagnosis not present

## 2015-04-07 DIAGNOSIS — Z87440 Personal history of urinary (tract) infections: Secondary | ICD-10-CM | POA: Diagnosis not present

## 2015-04-07 DIAGNOSIS — K5790 Diverticulosis of intestine, part unspecified, without perforation or abscess without bleeding: Secondary | ICD-10-CM | POA: Diagnosis not present

## 2015-04-07 DIAGNOSIS — R296 Repeated falls: Secondary | ICD-10-CM | POA: Diagnosis not present

## 2015-04-07 DIAGNOSIS — I2510 Atherosclerotic heart disease of native coronary artery without angina pectoris: Secondary | ICD-10-CM | POA: Diagnosis not present

## 2015-04-07 DIAGNOSIS — Z794 Long term (current) use of insulin: Secondary | ICD-10-CM | POA: Diagnosis not present

## 2015-04-07 DIAGNOSIS — K7460 Unspecified cirrhosis of liver: Secondary | ICD-10-CM | POA: Diagnosis not present

## 2015-04-07 DIAGNOSIS — K7581 Nonalcoholic steatohepatitis (NASH): Secondary | ICD-10-CM | POA: Diagnosis not present

## 2015-04-08 DIAGNOSIS — R296 Repeated falls: Secondary | ICD-10-CM | POA: Diagnosis not present

## 2015-04-08 DIAGNOSIS — Z794 Long term (current) use of insulin: Secondary | ICD-10-CM | POA: Diagnosis not present

## 2015-04-08 DIAGNOSIS — E1140 Type 2 diabetes mellitus with diabetic neuropathy, unspecified: Secondary | ICD-10-CM | POA: Diagnosis not present

## 2015-04-08 DIAGNOSIS — I10 Essential (primary) hypertension: Secondary | ICD-10-CM | POA: Diagnosis not present

## 2015-04-08 DIAGNOSIS — Z9181 History of falling: Secondary | ICD-10-CM | POA: Diagnosis not present

## 2015-04-08 DIAGNOSIS — Z87440 Personal history of urinary (tract) infections: Secondary | ICD-10-CM | POA: Diagnosis not present

## 2015-04-08 DIAGNOSIS — I2510 Atherosclerotic heart disease of native coronary artery without angina pectoris: Secondary | ICD-10-CM | POA: Diagnosis not present

## 2015-04-08 DIAGNOSIS — Z5181 Encounter for therapeutic drug level monitoring: Secondary | ICD-10-CM | POA: Diagnosis not present

## 2015-04-08 DIAGNOSIS — K5790 Diverticulosis of intestine, part unspecified, without perforation or abscess without bleeding: Secondary | ICD-10-CM | POA: Diagnosis not present

## 2015-04-08 DIAGNOSIS — K7460 Unspecified cirrhosis of liver: Secondary | ICD-10-CM | POA: Diagnosis not present

## 2015-04-08 DIAGNOSIS — E1142 Type 2 diabetes mellitus with diabetic polyneuropathy: Secondary | ICD-10-CM | POA: Diagnosis not present

## 2015-04-08 DIAGNOSIS — K7581 Nonalcoholic steatohepatitis (NASH): Secondary | ICD-10-CM | POA: Diagnosis not present

## 2015-04-12 DIAGNOSIS — Z87440 Personal history of urinary (tract) infections: Secondary | ICD-10-CM | POA: Diagnosis not present

## 2015-04-12 DIAGNOSIS — Z9181 History of falling: Secondary | ICD-10-CM | POA: Diagnosis not present

## 2015-04-12 DIAGNOSIS — Z794 Long term (current) use of insulin: Secondary | ICD-10-CM | POA: Diagnosis not present

## 2015-04-12 DIAGNOSIS — R296 Repeated falls: Secondary | ICD-10-CM | POA: Diagnosis not present

## 2015-04-12 DIAGNOSIS — I2510 Atherosclerotic heart disease of native coronary artery without angina pectoris: Secondary | ICD-10-CM | POA: Diagnosis not present

## 2015-04-12 DIAGNOSIS — E1142 Type 2 diabetes mellitus with diabetic polyneuropathy: Secondary | ICD-10-CM | POA: Diagnosis not present

## 2015-04-12 DIAGNOSIS — K7581 Nonalcoholic steatohepatitis (NASH): Secondary | ICD-10-CM | POA: Diagnosis not present

## 2015-04-12 DIAGNOSIS — I10 Essential (primary) hypertension: Secondary | ICD-10-CM | POA: Diagnosis not present

## 2015-04-12 DIAGNOSIS — Z5181 Encounter for therapeutic drug level monitoring: Secondary | ICD-10-CM | POA: Diagnosis not present

## 2015-04-12 DIAGNOSIS — K7460 Unspecified cirrhosis of liver: Secondary | ICD-10-CM | POA: Diagnosis not present

## 2015-04-12 DIAGNOSIS — K5790 Diverticulosis of intestine, part unspecified, without perforation or abscess without bleeding: Secondary | ICD-10-CM | POA: Diagnosis not present

## 2015-04-12 DIAGNOSIS — E1140 Type 2 diabetes mellitus with diabetic neuropathy, unspecified: Secondary | ICD-10-CM | POA: Diagnosis not present

## 2015-04-13 DIAGNOSIS — R600 Localized edema: Secondary | ICD-10-CM | POA: Diagnosis not present

## 2015-04-13 DIAGNOSIS — T84110D Breakdown (mechanical) of internal fixation device of right humerus, subsequent encounter: Secondary | ICD-10-CM | POA: Diagnosis not present

## 2015-04-14 DIAGNOSIS — I2510 Atherosclerotic heart disease of native coronary artery without angina pectoris: Secondary | ICD-10-CM | POA: Diagnosis not present

## 2015-04-14 DIAGNOSIS — E1140 Type 2 diabetes mellitus with diabetic neuropathy, unspecified: Secondary | ICD-10-CM | POA: Diagnosis not present

## 2015-04-14 DIAGNOSIS — E1142 Type 2 diabetes mellitus with diabetic polyneuropathy: Secondary | ICD-10-CM | POA: Diagnosis not present

## 2015-04-14 DIAGNOSIS — K7460 Unspecified cirrhosis of liver: Secondary | ICD-10-CM | POA: Diagnosis not present

## 2015-04-14 DIAGNOSIS — Z9181 History of falling: Secondary | ICD-10-CM | POA: Diagnosis not present

## 2015-04-14 DIAGNOSIS — K7581 Nonalcoholic steatohepatitis (NASH): Secondary | ICD-10-CM | POA: Diagnosis not present

## 2015-04-14 DIAGNOSIS — Z5181 Encounter for therapeutic drug level monitoring: Secondary | ICD-10-CM | POA: Diagnosis not present

## 2015-04-14 DIAGNOSIS — K5790 Diverticulosis of intestine, part unspecified, without perforation or abscess without bleeding: Secondary | ICD-10-CM | POA: Diagnosis not present

## 2015-04-14 DIAGNOSIS — Z794 Long term (current) use of insulin: Secondary | ICD-10-CM | POA: Diagnosis not present

## 2015-04-14 DIAGNOSIS — Z87440 Personal history of urinary (tract) infections: Secondary | ICD-10-CM | POA: Diagnosis not present

## 2015-04-14 DIAGNOSIS — I10 Essential (primary) hypertension: Secondary | ICD-10-CM | POA: Diagnosis not present

## 2015-04-14 DIAGNOSIS — R296 Repeated falls: Secondary | ICD-10-CM | POA: Diagnosis not present

## 2015-04-15 DIAGNOSIS — E1140 Type 2 diabetes mellitus with diabetic neuropathy, unspecified: Secondary | ICD-10-CM | POA: Diagnosis not present

## 2015-04-15 DIAGNOSIS — Z794 Long term (current) use of insulin: Secondary | ICD-10-CM | POA: Diagnosis not present

## 2015-04-15 DIAGNOSIS — K7581 Nonalcoholic steatohepatitis (NASH): Secondary | ICD-10-CM | POA: Diagnosis not present

## 2015-04-15 DIAGNOSIS — I2510 Atherosclerotic heart disease of native coronary artery without angina pectoris: Secondary | ICD-10-CM | POA: Diagnosis not present

## 2015-04-15 DIAGNOSIS — Z5181 Encounter for therapeutic drug level monitoring: Secondary | ICD-10-CM | POA: Diagnosis not present

## 2015-04-15 DIAGNOSIS — E1142 Type 2 diabetes mellitus with diabetic polyneuropathy: Secondary | ICD-10-CM | POA: Diagnosis not present

## 2015-04-15 DIAGNOSIS — Z87440 Personal history of urinary (tract) infections: Secondary | ICD-10-CM | POA: Diagnosis not present

## 2015-04-15 DIAGNOSIS — K5790 Diverticulosis of intestine, part unspecified, without perforation or abscess without bleeding: Secondary | ICD-10-CM | POA: Diagnosis not present

## 2015-04-15 DIAGNOSIS — R296 Repeated falls: Secondary | ICD-10-CM | POA: Diagnosis not present

## 2015-04-15 DIAGNOSIS — Z9181 History of falling: Secondary | ICD-10-CM | POA: Diagnosis not present

## 2015-04-15 DIAGNOSIS — K7460 Unspecified cirrhosis of liver: Secondary | ICD-10-CM | POA: Diagnosis not present

## 2015-04-15 DIAGNOSIS — I10 Essential (primary) hypertension: Secondary | ICD-10-CM | POA: Diagnosis not present

## 2015-04-20 DIAGNOSIS — Z6838 Body mass index (BMI) 38.0-38.9, adult: Secondary | ICD-10-CM | POA: Diagnosis not present

## 2015-04-20 DIAGNOSIS — R600 Localized edema: Secondary | ICD-10-CM | POA: Diagnosis not present

## 2015-04-22 DIAGNOSIS — I2510 Atherosclerotic heart disease of native coronary artery without angina pectoris: Secondary | ICD-10-CM | POA: Diagnosis not present

## 2015-04-22 DIAGNOSIS — I10 Essential (primary) hypertension: Secondary | ICD-10-CM | POA: Diagnosis not present

## 2015-04-22 DIAGNOSIS — K7460 Unspecified cirrhosis of liver: Secondary | ICD-10-CM | POA: Diagnosis not present

## 2015-04-22 DIAGNOSIS — E1142 Type 2 diabetes mellitus with diabetic polyneuropathy: Secondary | ICD-10-CM | POA: Diagnosis not present

## 2015-04-25 DIAGNOSIS — L600 Ingrowing nail: Secondary | ICD-10-CM | POA: Diagnosis not present

## 2015-04-25 DIAGNOSIS — L03032 Cellulitis of left toe: Secondary | ICD-10-CM | POA: Diagnosis not present

## 2015-04-25 DIAGNOSIS — L89892 Pressure ulcer of other site, stage 2: Secondary | ICD-10-CM | POA: Diagnosis not present

## 2015-04-25 DIAGNOSIS — L03031 Cellulitis of right toe: Secondary | ICD-10-CM | POA: Diagnosis not present

## 2015-05-24 DIAGNOSIS — L03032 Cellulitis of left toe: Secondary | ICD-10-CM | POA: Diagnosis not present

## 2015-05-24 DIAGNOSIS — M79675 Pain in left toe(s): Secondary | ICD-10-CM | POA: Diagnosis not present

## 2015-05-24 DIAGNOSIS — M79672 Pain in left foot: Secondary | ICD-10-CM | POA: Diagnosis not present

## 2015-06-03 DIAGNOSIS — F419 Anxiety disorder, unspecified: Secondary | ICD-10-CM | POA: Diagnosis not present

## 2015-06-03 DIAGNOSIS — D519 Vitamin B12 deficiency anemia, unspecified: Secondary | ICD-10-CM | POA: Diagnosis not present

## 2015-06-03 DIAGNOSIS — G894 Chronic pain syndrome: Secondary | ICD-10-CM | POA: Diagnosis not present

## 2015-06-03 DIAGNOSIS — Z681 Body mass index (BMI) 19 or less, adult: Secondary | ICD-10-CM | POA: Diagnosis not present

## 2015-06-03 DIAGNOSIS — K7581 Nonalcoholic steatohepatitis (NASH): Secondary | ICD-10-CM | POA: Diagnosis not present

## 2015-06-03 DIAGNOSIS — Z1389 Encounter for screening for other disorder: Secondary | ICD-10-CM | POA: Diagnosis not present

## 2015-06-06 DIAGNOSIS — G894 Chronic pain syndrome: Secondary | ICD-10-CM | POA: Diagnosis not present

## 2015-06-06 DIAGNOSIS — Z681 Body mass index (BMI) 19 or less, adult: Secondary | ICD-10-CM | POA: Diagnosis not present

## 2015-06-06 DIAGNOSIS — Z1389 Encounter for screening for other disorder: Secondary | ICD-10-CM | POA: Diagnosis not present

## 2015-06-06 DIAGNOSIS — F419 Anxiety disorder, unspecified: Secondary | ICD-10-CM | POA: Diagnosis not present

## 2015-06-21 DIAGNOSIS — E1142 Type 2 diabetes mellitus with diabetic polyneuropathy: Secondary | ICD-10-CM | POA: Diagnosis not present

## 2015-06-21 DIAGNOSIS — I2510 Atherosclerotic heart disease of native coronary artery without angina pectoris: Secondary | ICD-10-CM | POA: Diagnosis not present

## 2015-06-21 DIAGNOSIS — K7460 Unspecified cirrhosis of liver: Secondary | ICD-10-CM | POA: Diagnosis not present

## 2015-06-21 DIAGNOSIS — I10 Essential (primary) hypertension: Secondary | ICD-10-CM | POA: Diagnosis not present

## 2015-07-05 DIAGNOSIS — M79675 Pain in left toe(s): Secondary | ICD-10-CM | POA: Diagnosis not present

## 2015-07-05 DIAGNOSIS — L03032 Cellulitis of left toe: Secondary | ICD-10-CM | POA: Diagnosis not present

## 2015-07-05 DIAGNOSIS — M79672 Pain in left foot: Secondary | ICD-10-CM | POA: Diagnosis not present

## 2015-08-01 ENCOUNTER — Encounter: Payer: Self-pay | Admitting: Internal Medicine

## 2015-08-01 DIAGNOSIS — M79672 Pain in left foot: Secondary | ICD-10-CM | POA: Diagnosis not present

## 2015-08-01 DIAGNOSIS — L03032 Cellulitis of left toe: Secondary | ICD-10-CM | POA: Diagnosis not present

## 2015-08-01 DIAGNOSIS — M79675 Pain in left toe(s): Secondary | ICD-10-CM | POA: Diagnosis not present

## 2015-08-03 DIAGNOSIS — Z1389 Encounter for screening for other disorder: Secondary | ICD-10-CM | POA: Diagnosis not present

## 2015-08-03 DIAGNOSIS — Z6838 Body mass index (BMI) 38.0-38.9, adult: Secondary | ICD-10-CM | POA: Diagnosis not present

## 2015-08-03 DIAGNOSIS — Z23 Encounter for immunization: Secondary | ICD-10-CM | POA: Diagnosis not present

## 2015-08-03 DIAGNOSIS — D519 Vitamin B12 deficiency anemia, unspecified: Secondary | ICD-10-CM | POA: Diagnosis not present

## 2015-08-03 DIAGNOSIS — G894 Chronic pain syndrome: Secondary | ICD-10-CM | POA: Diagnosis not present

## 2015-08-11 ENCOUNTER — Other Ambulatory Visit: Payer: Self-pay | Admitting: "Endocrinology

## 2015-08-29 DIAGNOSIS — M79675 Pain in left toe(s): Secondary | ICD-10-CM | POA: Diagnosis not present

## 2015-08-29 DIAGNOSIS — M79672 Pain in left foot: Secondary | ICD-10-CM | POA: Diagnosis not present

## 2015-08-29 DIAGNOSIS — L03032 Cellulitis of left toe: Secondary | ICD-10-CM | POA: Diagnosis not present

## 2015-09-22 DIAGNOSIS — Z1389 Encounter for screening for other disorder: Secondary | ICD-10-CM | POA: Diagnosis not present

## 2015-09-22 DIAGNOSIS — M1991 Primary osteoarthritis, unspecified site: Secondary | ICD-10-CM | POA: Diagnosis not present

## 2015-09-22 DIAGNOSIS — D511 Vitamin B12 deficiency anemia due to selective vitamin B12 malabsorption with proteinuria: Secondary | ICD-10-CM | POA: Diagnosis not present

## 2015-09-22 DIAGNOSIS — Z681 Body mass index (BMI) 19 or less, adult: Secondary | ICD-10-CM | POA: Diagnosis not present

## 2015-09-22 DIAGNOSIS — G894 Chronic pain syndrome: Secondary | ICD-10-CM | POA: Diagnosis not present

## 2015-09-28 ENCOUNTER — Telehealth: Payer: Self-pay | Admitting: "Endocrinology

## 2015-09-28 DIAGNOSIS — IMO0002 Reserved for concepts with insufficient information to code with codable children: Secondary | ICD-10-CM

## 2015-09-28 DIAGNOSIS — E1165 Type 2 diabetes mellitus with hyperglycemia: Secondary | ICD-10-CM

## 2015-09-28 DIAGNOSIS — E1169 Type 2 diabetes mellitus with other specified complication: Secondary | ICD-10-CM | POA: Diagnosis not present

## 2015-09-28 NOTE — Telephone Encounter (Signed)
needs labs sent to [facility] - she is going this week to get them drawn

## 2015-09-30 LAB — HEMOGLOBIN A1C
HEMOGLOBIN A1C: 7.9 % — AB (ref <5.7)
Mean Plasma Glucose: 180 mg/dL — ABNORMAL HIGH (ref <117)

## 2015-09-30 LAB — COMPREHENSIVE METABOLIC PANEL
ALT: 8 U/L (ref 6–29)
AST: 16 U/L (ref 10–35)
Albumin: 3.1 g/dL — ABNORMAL LOW (ref 3.6–5.1)
Alkaline Phosphatase: 116 U/L (ref 33–130)
BUN: 14 mg/dL (ref 7–25)
CO2: 24 mmol/L (ref 20–31)
Calcium: 8 mg/dL — ABNORMAL LOW (ref 8.6–10.4)
Chloride: 106 mmol/L (ref 98–110)
Creat: 0.73 mg/dL (ref 0.60–0.93)
GLUCOSE: 184 mg/dL — AB (ref 65–99)
POTASSIUM: 4 mmol/L (ref 3.5–5.3)
SODIUM: 139 mmol/L (ref 135–146)
Total Bilirubin: 1.4 mg/dL — ABNORMAL HIGH (ref 0.2–1.2)
Total Protein: 5.3 g/dL — ABNORMAL LOW (ref 6.1–8.1)

## 2015-10-10 ENCOUNTER — Ambulatory Visit (INDEPENDENT_AMBULATORY_CARE_PROVIDER_SITE_OTHER): Payer: Medicare Other | Admitting: "Endocrinology

## 2015-10-10 ENCOUNTER — Encounter: Payer: Self-pay | Admitting: "Endocrinology

## 2015-10-10 VITALS — BP 145/79 | HR 82 | Ht 68.0 in | Wt 247.0 lb

## 2015-10-10 DIAGNOSIS — E785 Hyperlipidemia, unspecified: Secondary | ICD-10-CM | POA: Diagnosis not present

## 2015-10-10 DIAGNOSIS — IMO0002 Reserved for concepts with insufficient information to code with codable children: Secondary | ICD-10-CM

## 2015-10-10 DIAGNOSIS — E1165 Type 2 diabetes mellitus with hyperglycemia: Secondary | ICD-10-CM | POA: Diagnosis not present

## 2015-10-10 DIAGNOSIS — E1142 Type 2 diabetes mellitus with diabetic polyneuropathy: Secondary | ICD-10-CM | POA: Diagnosis not present

## 2015-10-10 IMAGING — CR DG CHEST 1V
1 series · 1 of 1 positions shown · non-contrast
Comparison: 12/15/2014

CLINICAL DATA: Fell from coli tonight. Weakness. Previous history
of coronary disease, asthma, diabetes, heart catheterization and
right shoulder injury.

EXAM:
CHEST  1 VIEW

[view not recorded]
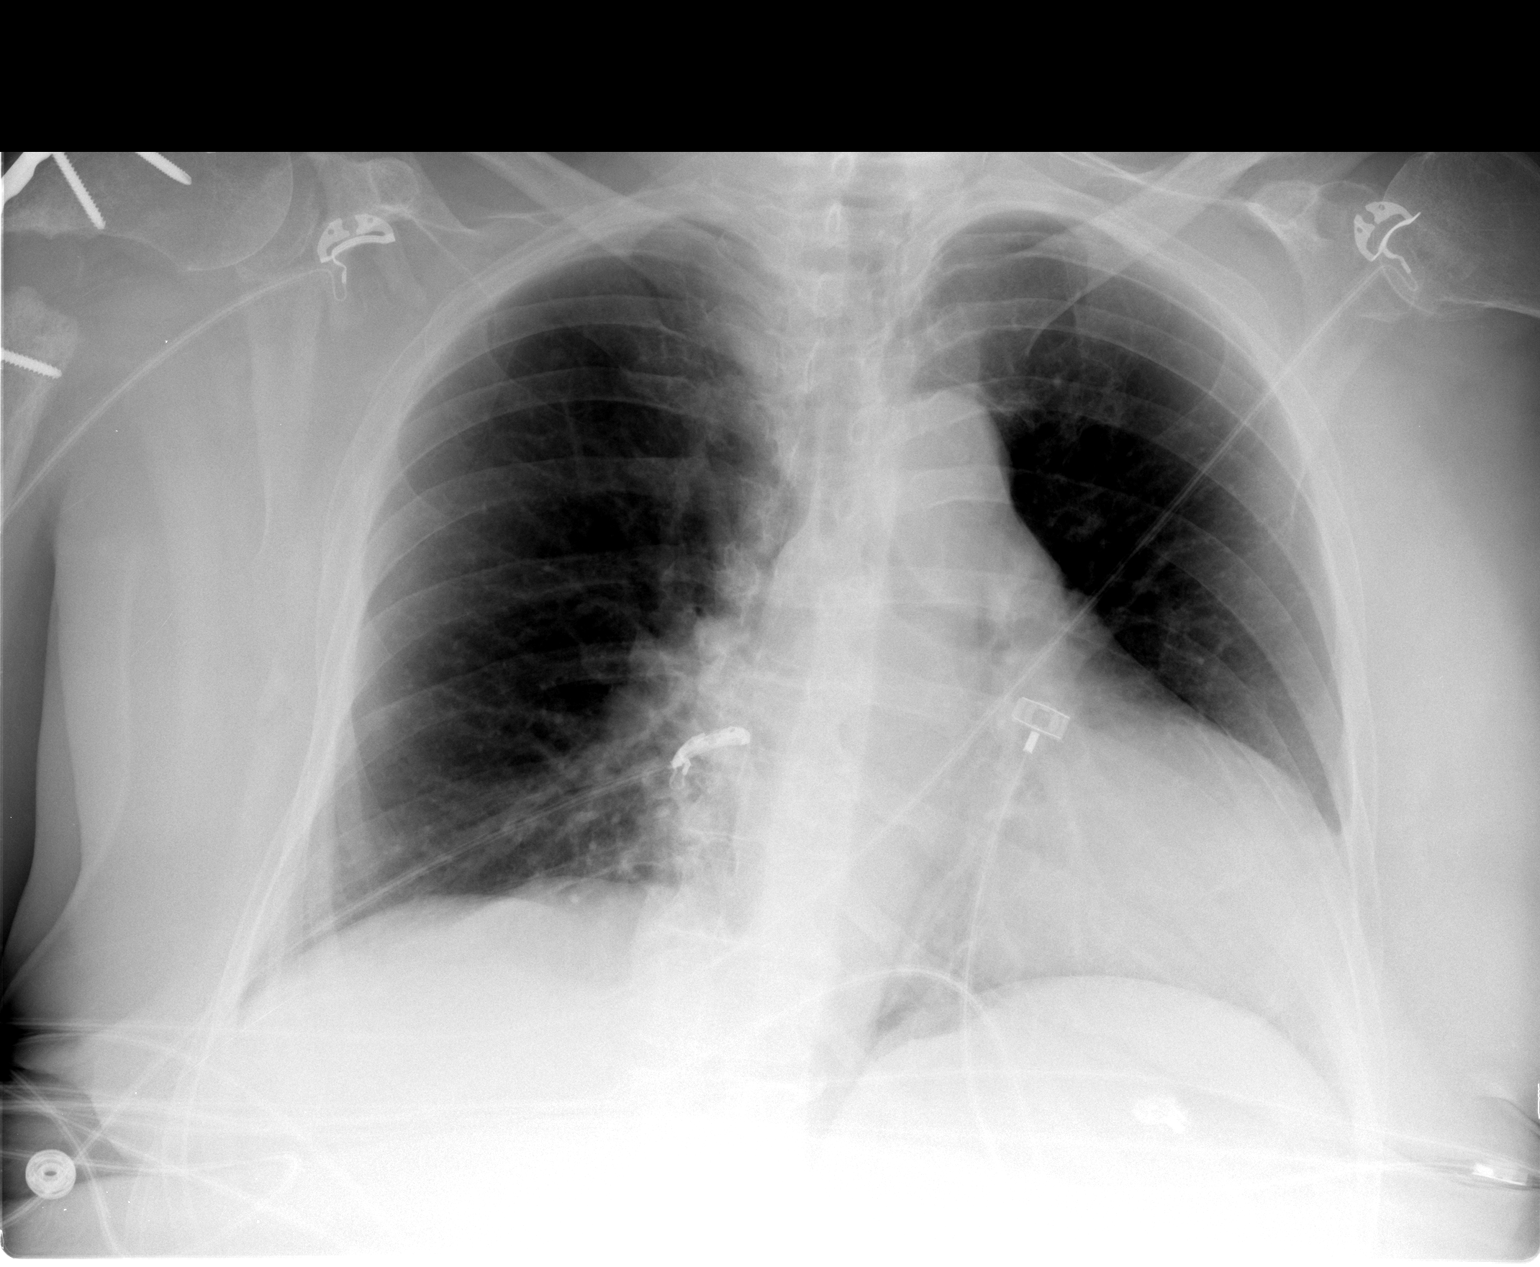

[1 of 1 positions shown; findings below may reference images not displayed]

FINDINGS: Postoperative changes in the right shoulder. Shallow inspiration.
Normal heart size and pulmonary vascularity. No focal airspace
disease or consolidation in the lungs. No blunting of costophrenic
angles. No pneumothorax. Mediastinal contours appear intact.
IMPRESSION: No active disease.

## 2015-10-10 MED ORDER — INSULIN ASPART 100 UNIT/ML FLEXPEN: 10.0000 [IU] | PEN_INJECTOR | Freq: Three times a day (TID) | SUBCUTANEOUS | Status: DC

## 2015-10-10 MED ORDER — INSULIN PEN NEEDLE 31G X 8 MM MISC: 1.0000 | Status: DC

## 2015-10-10 MED ORDER — INSULIN DETEMIR 100 UNIT/ML ~~LOC~~ SOLN: 60.0000 [IU] | Freq: Every day | SUBCUTANEOUS | Status: DC

## 2015-10-10 MED ORDER — GLUCOSE BLOOD VI STRP: ORAL_STRIP | Status: DC

## 2015-10-10 NOTE — Patient Instructions (Signed)

## 2015-10-10 NOTE — Progress Notes (Signed)
Subjective:    Patient ID: [name] [name], female    DOB: 02/18/1941, PCP Purvis Kilts, MD   Past Medical History  Diagnosis Date  . CAD in native artery   . Thrombocytopenia, unspecified ([place])   . Unspecified hereditary and idiopathic peripheral neuropathy   . Unspecified fall   . Other and unspecified hyperlipidemia   . Unspecified essential hypertension   . Obesity, unspecified   . Personal history of neurosis   . Intrinsic asthma, unspecified   . Allergic rhinitis, cause unspecified   . Cirrhosis of liver ([facility])     NASH, afp on 06/17/12 =3.7, per pt she had hep B vaccines in 1996, hep A in process.   . Colon adenomas 03/08/10    tcs by Dr. Gala Romney  . Hyperplastic polyps of stomach 03/08/10    tcs by Dr. Dudley Major  . Chronic gastritis 03/09/11    egd by Dr. Gala Romney  . History of hemorrhoids 03/08/10    tcs- internal and external  . Diverticula of colon 03/08/10    L side  . Esophagitis, erosive 03/08/10  . GERD (gastroesophageal reflux disease)   . Wears glasses   . Type II or unspecified type diabetes mellitus without mention of complication, not stated as uncontrolled   . Vertigo   . Pancreatitis   . Bilateral leg weakness   . Thrombocytopenia ([place]) 04/12/2014  . Pancytopenia, acquired ([place]) 04/12/2014  . Cirrhosis ([place])   . Abnormal EKG     hx of left anterior fasicular block on 04-09-13 ekg epic  . Chronic shoulder pain    Past Surgical History  Procedure Laterality Date  . Hysterectomy and btl      s/p  . Tumor excision  2003    rt arm and left foot  . Colonoscopy  03/08/10    Dr. [name]-->ext/int hemorrhoids, anal paipilla, rectal polyps, desc polyps, cecal polyp, left-sided diverticula. suboptiomal prep. next TCS 02/2015. multiple adenomas  . Esophagogastroduodenoscopy  03/08/10    Dr. [name] erosive RE, small hh, antral erosions, small 1cm are of mucosal indentation along gastric body of doubtful significance, cystic nodularity of hypophyarynx, base of tongue,  base of epiglottis. benign gastric biopsies  . Tubal ligation    . Esophagogastroduodenoscopy  10/08/2011    Dr. [name] esophageal varices, antral erosion. next egd 09/2013  . Foot surgery  2003    lt foot  . Eye surgery      cataracts bilateral  . Orif humerus fracture Right 02/17/2013    Procedure: OPEN REDUCTION INTERNAL FIXATION (ORIF) RIGHT PROXIMAL HUMERUS FRACTURE;  Surgeon: [name], MD;  Location: [place];  Service: Orthopedics;  Laterality: Right;  . Partial gastrectomy      family denies  . Esophagogastroduodenoscopy N/A 02/17/2014    Dr. Gala Romney: portal gastropathy, no varices. Due for surveillance 2017  . Breast surgery Right few yrs ago    benign area removed  . Abdominal hysterectomy    . Cardiac catheterization  02/25/2007    Dr. [name]:  normal LV systolic function, mild irregularities of LAD   Social History   Social History  . Marital Status: Married    Spouse Name: N/A  . Number of Children: 3  . Years of Education: N/A   Social History Main Topics  . Smoking status: Never Smoker   . Smokeless tobacco: Never Used  . Alcohol Use: No  . Drug Use: No  . Sexual Activity: Not Currently    Birth Control/  Protection: Surgical   Other Topics Concern  . None   Social History Narrative   Outpatient Encounter Prescriptions as of 10/10/2015  Medication Sig  . ALPRAZolam (XANAX) 0.5 MG tablet Take 1 tablet (0.5 mg total) by mouth 3 (three) times daily as needed for anxiety.  Marland Kitchen aspirin EC 325 MG EC tablet Take 1 tablet (325 mg total) by mouth every morning.  . budesonide-formoterol (SYMBICORT) 160-4.5 MCG/ACT inhaler Inhale 2 puffs into the lungs at bedtime.   . enalapril (VASOTEC) 5 MG tablet Take 1 tablet by mouth daily.  Marland Kitchen ESTRACE VAGINAL 0.1 MG/GM vaginal cream Place 1 Applicatorful vaginally every other day. At  bedtime  . furosemide (LASIX) 20 MG tablet Take 1 tablet (20 mg total) by mouth every morning.  Marland Kitchen glucose blood (BAYER CONTOUR TEST) test strip Use  as instructed  . insulin aspart (NOVOLOG FLEXPEN) 100 UNIT/ML FlexPen Inject 10-16 Units into the skin 3 (three) times daily with meals.  . insulin detemir (LEVEMIR) 100 UNIT/ML injection Inject 0.6 mLs (60 Units total) into the skin at bedtime.  . Insulin Pen Needle (B-D ULTRAFINE III SHORT PEN) 31G X 8 MM MISC 1 each by Does not apply route as directed.  . lactulose (CHRONULAC) 10 GM/15ML solution Take 60 mLs by mouth 2 (two) times daily.  . meclizine (ANTIVERT) 25 MG tablet Take 25 mg by mouth 3 (three) times daily as needed for dizziness or nausea.   . Multiple Vitamins-Minerals (CENTRUM SILVER PO) Take 1 tablet by mouth daily.   Marland Kitchen NEXIUM 40 MG capsule Take 40 mg by mouth every morning.   . ondansetron (ZOFRAN ODT) 4 MG disintegrating tablet Take 1 tablet (4 mg total) by mouth every 8 (eight) hours as needed for nausea or vomiting.  . ondansetron (ZOFRAN) 4 MG tablet Take 1 tablet (4 mg total) by mouth every 6 (six) hours as needed for nausea.  . OxyCODONE (OXYCONTIN) 10 mg T12A 12 hr tablet Take 1 tablet (10 mg total) by mouth 2 (two) times daily as needed (severe pain).  . pregabalin (LYRICA) 100 MG capsule Take 100 mg by mouth 2 (two) times daily.  . quinapril (ACCUPRIL) 5 MG tablet Take 5 mg by mouth at bedtime.  . rifaximin (XIFAXAN) 550 MG TABS tablet Take 1 tablet (550 mg total) by mouth 2 (two) times daily.  Marland Kitchen spironolactone (ALDACTONE) 50 MG tablet Take 1 tablet (50 mg total) by mouth every morning.  . triamcinolone cream (KENALOG) 0.1 % Apply 1 application topically daily as needed (for irritation).   . ursodiol (URSO FORTE) 500 MG tablet Take 500 mg by mouth 2 (two) times daily.  . [DISCONTINUED] insulin aspart (NOVOLOG FLEXPEN) 100 UNIT/ML FlexPen Inject 10-16 Units into the skin 3 (three) times daily with meals.  . [DISCONTINUED] insulin detemir (LEVEMIR) 100 UNIT/ML injection Inject 60 Units into the skin at bedtime.   No facility-administered encounter medications on file as of  10/10/2015.   ALLERGIES: Allergies  Allergen Reactions  . Morphine And Related Itching  . Compazine [Prochlorperazine Edisylate] Anxiety    Causes anxiety & nervousness   VACCINATION STATUS: Immunization History  Administered Date(s) Administered  . Influenza,inj,Quad PF,36+ Mos 07/31/2013, 08/05/2014  . Tdap 09/18/2013    Diabetes She presents for her follow-up (Patient missed several appointments, last seen here 20 months ago.) diabetic visit. She has type 2 diabetes mellitus. Onset time: she was diagnosed at approximate age of 81 years. Her disease course has been stable. There are no hypoglycemic associated symptoms.  Pertinent negatives for hypoglycemia include no confusion, headaches, pallor or seizures. Associated symptoms include polydipsia and polyuria. Pertinent negatives for diabetes include no chest pain and no polyphagia. There are no hypoglycemic complications. Symptoms are stable. Diabetic complications include peripheral neuropathy. Risk factors for coronary artery disease include diabetes mellitus, hypertension, sedentary lifestyle, obesity and tobacco exposure. She is compliant with treatment some of the time. She is following a generally unhealthy diet. She has not had a previous visit with a dietitian. She never participates in exercise. Home blood sugar record trend: she did not bring anny meter nor logs. An ACE inhibitor/angiotensin II receptor blocker is being taken.  Hyperlipidemia This is a chronic problem. The current episode started more than 1 year ago. Pertinent negatives include no chest pain, myalgias or shortness of breath. Risk factors for coronary artery disease include diabetes mellitus, hypertension and a sedentary lifestyle.  Hypertension This is a chronic problem. The current episode started more than 1 year ago. Pertinent negatives include no chest pain, headaches, palpitations or shortness of breath. Past treatments include ACE inhibitors.     Review of  Systems  Constitutional: Negative for unexpected weight change.  HENT: Negative for trouble swallowing and voice change.   Eyes: Negative for visual disturbance.  Respiratory: Negative for cough, shortness of breath and wheezing.   Cardiovascular: Negative for chest pain, palpitations and leg swelling.  Gastrointestinal: Negative for nausea, vomiting and diarrhea.  Endocrine: Positive for polydipsia and polyuria. Negative for cold intolerance, heat intolerance and polyphagia.  Musculoskeletal: Negative for myalgias and arthralgias.  Skin: Negative for color change, pallor, rash and wound.  Neurological: Negative for seizures and headaches.  Psychiatric/Behavioral: Negative for suicidal ideas and confusion.    Objective:    BP 145/79 mmHg  Pulse 82  Ht 5\' 8"  (1.727 m)  Wt 247 lb (112.038 kg)  BMI 37.56 kg/m2  SpO2 95%  Wt Readings from Last 3 Encounters:  10/10/15 247 lb (112.038 kg)  02/25/15 233 lb 3.2 oz (105.779 kg)  01/17/15 238 lb (107.956 kg)    Physical Exam  Constitutional: She is oriented to person, place, and time. She appears well-developed.  Remains wheelchair bound.  HENT:  Head: Normocephalic and atraumatic.  Eyes: EOM are normal.  Neck: Normal range of motion. Neck supple. No tracheal deviation present. No thyromegaly present.  Cardiovascular: Normal rate and regular rhythm.   Pulmonary/Chest: Effort normal and breath sounds normal.  Abdominal: Soft. Bowel sounds are normal. There is no tenderness. There is no guarding.  Musculoskeletal: Normal range of motion. She exhibits no edema.  Neurological: She is alert and oriented to person, place, and time. No cranial nerve deficit. Coordination normal.  Remains wheelchair bound.   Skin: Skin is warm and dry. No rash noted. No erythema. No pallor.  Psychiatric: She has a normal mood and affect. Judgment normal.    Results for orders placed or performed in visit on 09/28/15  Hemoglobin A1c  Result Value Ref  Range   Hgb A1c MFr Bld 7.9 (H) <5.7 %   Mean Plasma Glucose 180 (H) <117 mg/dL  Comprehensive metabolic panel  Result Value Ref Range   Sodium 139 135 - 146 mmol/L   Potassium 4.0 3.5 - 5.3 mmol/L   Chloride 106 98 - 110 mmol/L   CO2 24 20 - 31 mmol/L   Glucose, Bld 184 (H) 65 - 99 mg/dL   BUN 14 7 - 25 mg/dL   Creat 0.73 0.60 - 0.93 mg/dL   Total Bilirubin  1.4 (H) 0.2 - 1.2 mg/dL   Alkaline Phosphatase 116 33 - 130 U/L   AST 16 10 - 35 U/L   ALT 8 6 - 29 U/L   Total Protein 5.3 (L) 6.1 - 8.1 g/dL   Albumin 3.1 (L) 3.6 - 5.1 g/dL   Calcium 8.0 (L) 8.6 - 10.4 mg/dL   Complete Blood Count (Most recent):  Chemistry (most recent): Lab Results  Component Value Date   NA 139 09/28/2015   K 4.0 09/28/2015   CL 106 09/28/2015   CO2 24 09/28/2015   BUN 14 09/28/2015   CREATININE 0.73 09/28/2015   Diabetic Labs (most recent): Lab Results  Component Value Date   HGBA1C 7.9* 09/28/2015   HGBA1C 7.0* 12/24/2014   HGBA1C 7.3* 08/04/2014   Lipid profile (most recent): Lab Results  Component Value Date   TRIG 53 04/28/2013   CHOL 132 04/28/2013    Assessment & Plan:   1. Uncontrolled type 2 diabetes mellitus with peripheral neuropathy ([place])  - Her  diabetes is  complicated by neuropathy and patient remains at a high risk for more acute and chronic complications of diabetes which include CAD, CVA, CKD, retinopathy, and neuropathy. These are all discussed in detail with the patient.  Patient came with no glucose readings nor meter ,  and  recent A1c of 7.9 %.  Recent labs reviewed.   - I have re-counseled the patient on diet management and weight loss  by adopting a carbohydrate restricted / protein rich  Diet.  - Suggestion is made for patient to avoid simple carbohydrates   from their diet including Cakes , Desserts, Ice Cream,  Soda (  diet and regular) , Sweet Tea , Candies,  Chips, Cookies, Artificial Sweeteners,   and "Sugar-free" Products .  This will help patient to  have stable blood glucose profile and potentially avoid unintended  Weight gain.  - Patient is advised to stick to a routine mealtimes to eat 3 meals  a day and avoid unnecessary snacks ( to snack only to correct hypoglycemia).  - The patient  Will be scheduled with Jearld Fenton, RDN, CDE for individualized DM education.  - I have approached patient with the following individualized plan to manage diabetes and patient agrees.   - I will  Levemir 60 units qhs, and  Novolog 10 units TIDAC plus SSI.  - she is asked to resume to document BG readings ac and hs.  -insulin is the only safe medication she should be getting given her hx of  Liver failure from unidentified medication. - Patient specific target  for A1c; LDL, HDL, Triglycerides, and  Waist Circumference were discussed in detail.  2) BP/HTN: uncontrolled . Continue current medications including ACEI/ARB.  3)  Weight/Diet: CDE consult will be requested, basic  carbohydrates information provided.  4) Chronic Care/Health Maintenance:  -Patient is on ACEI/ARB  medications and encouraged to continue to follow up with Ophthalmology, Podiatrist at least yearly or according to recommendations, and advised to stay away from smoking. I have recommended yearly flu vaccine and pneumonia vaccination at least every 5 years; and  sleep for at least 7 hours a day.  - 25 minutes of time was spent on the care of this patient , 50% of which was applied for counseling on diabetes complications and their preventions.  - I advised patient to maintain close follow up with Purvis Kilts, MD for primary care needs.  Patient is asked to bring meter and  blood glucose logs during their next visit.   Follow up plan: -Return in about 10 weeks (around 12/19/2015) for diabetes, high cholesterol, follow up with pre-visit labs, meter, and logs.  Glade Lloyd, MD Phone: [phone]  Fax: [phone]   10/10/2015, 7:23 PM

## 2015-10-11 ENCOUNTER — Ambulatory Visit (HOSPITAL_COMMUNITY): Payer: Medicare Other | Admitting: Physical Therapy

## 2015-10-14 ENCOUNTER — Other Ambulatory Visit: Payer: Self-pay

## 2015-10-14 MED ORDER — INSULIN LISPRO 100 UNIT/ML (KWIKPEN): 10.0000 [IU] | PEN_INJECTOR | Freq: Three times a day (TID) | SUBCUTANEOUS | Status: DC

## 2015-10-17 ENCOUNTER — Other Ambulatory Visit: Payer: Self-pay

## 2015-10-17 DIAGNOSIS — E1165 Type 2 diabetes mellitus with hyperglycemia: Secondary | ICD-10-CM | POA: Insufficient documentation

## 2015-10-17 DIAGNOSIS — E1169 Type 2 diabetes mellitus with other specified complication: Principal | ICD-10-CM

## 2015-10-17 DIAGNOSIS — IMO0002 Reserved for concepts with insufficient information to code with codable children: Secondary | ICD-10-CM | POA: Insufficient documentation

## 2015-10-17 MED ORDER — BLOOD GLUCOSE MONITOR KIT: PACK | Status: DC

## 2015-10-19 DIAGNOSIS — K7460 Unspecified cirrhosis of liver: Secondary | ICD-10-CM | POA: Diagnosis not present

## 2015-10-19 DIAGNOSIS — E1142 Type 2 diabetes mellitus with diabetic polyneuropathy: Secondary | ICD-10-CM | POA: Diagnosis not present

## 2015-10-19 DIAGNOSIS — I2510 Atherosclerotic heart disease of native coronary artery without angina pectoris: Secondary | ICD-10-CM | POA: Diagnosis not present

## 2015-10-19 DIAGNOSIS — I10 Essential (primary) hypertension: Secondary | ICD-10-CM | POA: Diagnosis not present

## 2015-10-24 ENCOUNTER — Ambulatory Visit (HOSPITAL_COMMUNITY): Payer: Medicare Other | Attending: Family Medicine | Admitting: Physical Therapy

## 2015-10-24 DIAGNOSIS — R2689 Other abnormalities of gait and mobility: Secondary | ICD-10-CM | POA: Insufficient documentation

## 2015-10-24 DIAGNOSIS — R29898 Other symptoms and signs involving the musculoskeletal system: Secondary | ICD-10-CM | POA: Insufficient documentation

## 2015-10-24 DIAGNOSIS — M79675 Pain in left toe(s): Secondary | ICD-10-CM | POA: Diagnosis not present

## 2015-10-24 DIAGNOSIS — R296 Repeated falls: Secondary | ICD-10-CM | POA: Diagnosis not present

## 2015-10-24 DIAGNOSIS — M79672 Pain in left foot: Secondary | ICD-10-CM | POA: Diagnosis not present

## 2015-10-24 DIAGNOSIS — L03032 Cellulitis of left toe: Secondary | ICD-10-CM | POA: Diagnosis not present

## 2015-10-24 NOTE — Therapy (Signed)
[facility] [address] [place], [place], [address] Phone: [phone]   Fax:  [phone]  Physical Therapy Evaluation  Patient Details  Name: [name] [name] MRN: [id number] Date of Birth: Jun 18, 1941 Referring Provider: Hilma Favors   Encounter Date: 10/24/2015      PT End of Session - 10/24/15 1040    Visit Number 1   Number of Visits 1   Authorization - Visit Number 1   Authorization - Number of Visits 1   PT Start Time 0935   PT Stop Time [id number]   PT Time Calculation (min) 60 min   Equipment Utilized During Treatment Gait belt   Activity Tolerance Patient tolerated treatment well   Behavior During Therapy [facility] for tasks assessed/performed      Past Medical History  Diagnosis Date  . CAD in native artery   . Thrombocytopenia, unspecified ([place])   . Unspecified hereditary and idiopathic peripheral neuropathy   . Unspecified fall   . Other and unspecified hyperlipidemia   . Unspecified essential hypertension   . Obesity, unspecified   . Personal history of neurosis   . Intrinsic asthma, unspecified   . Allergic rhinitis, cause unspecified   . Cirrhosis of liver ([facility])     NASH, afp on 06/17/12 =3.7, per pt she had hep B vaccines in 1996, hep A in process.   . Colon adenomas 03/08/10    tcs by Dr. Gala Romney  . Hyperplastic polyps of stomach 03/08/10    tcs by Dr. Dudley Major  . Chronic gastritis 03/09/11    egd by Dr. Gala Romney  . History of hemorrhoids 03/08/10    tcs- internal and external  . Diverticula of colon 03/08/10    L side  . Esophagitis, erosive 03/08/10  . GERD (gastroesophageal reflux disease)   . Wears glasses   . Type II or unspecified type diabetes mellitus without mention of complication, not stated as uncontrolled   . Vertigo   . Pancreatitis   . Bilateral leg weakness   . Thrombocytopenia ([place]) 04/12/2014  . Pancytopenia, acquired ([place]) 04/12/2014  . Cirrhosis ([place])   . Abnormal EKG     hx of left anterior fasicular block on  04-09-13 ekg epic  . Chronic shoulder pain     Past Surgical History  Procedure Laterality Date  . Hysterectomy and btl      s/p  . Tumor excision  2003    rt arm and left foot  . Colonoscopy  03/08/10    Dr. [name]-->ext/int hemorrhoids, anal paipilla, rectal polyps, desc polyps, cecal polyp, left-sided diverticula. suboptiomal prep. next TCS 02/2015. multiple adenomas  . Esophagogastroduodenoscopy  03/08/10    Dr. [name] erosive RE, small hh, antral erosions, small 1cm are of mucosal indentation along gastric body of doubtful significance, cystic nodularity of hypophyarynx, base of tongue, base of epiglottis. benign gastric biopsies  . Tubal ligation    . Esophagogastroduodenoscopy  10/08/2011    Dr. [name] esophageal varices, antral erosion. next egd 09/2013  . Foot surgery  2003    lt foot  . Eye surgery      cataracts bilateral  . Orif humerus fracture Right 02/17/2013    Procedure: OPEN REDUCTION INTERNAL FIXATION (ORIF) RIGHT PROXIMAL HUMERUS FRACTURE;  Surgeon: [name], MD;  Location: [facility];  Service: Orthopedics;  Laterality: Right;  . Partial gastrectomy      family denies  . Esophagogastroduodenoscopy N/A 02/17/2014    Dr. Gala Romney: portal gastropathy, no varices. Due for  surveillance 2017  . Breast surgery Right few yrs ago    benign area removed  . Abdominal hysterectomy    . Cardiac catheterization  02/25/2007    Dr. [name]:  normal LV systolic function, mild irregularities of LAD    There were no vitals filed for this visit.  Visit Diagnosis:  Poor balance - Plan: PT plan of care cert/re-cert  Proximal leg weakness - Plan: PT plan of care cert/re-cert  Repeated falls - Plan: PT plan of care cert/re-cert      Subjective Assessment - 10/24/15 0939    Subjective Ms. Amling states that she has been wheelchaired bound for several years.  She fx her Rt  arm several years ago she had an ORIF but the plates and screws have broke and she is unable to go through  surgery.  She states that she has been able to assist with transfers but her legs seem to be getting weaker and it is more difficult for her to transfer from her bed to the wheelchair and from the wheelchair to the commode.     Pertinent History 74 year old female who  has a past medical history of CAD in native artery; Thrombocytopenia, unspecified; Unspecified hereditary and idiopathic peripheral neuropathy; Unspecified fall; Other and unspecified hyperlipidemia; Unspecified essential hypertension; Obesity, unspecified; Personal history of neurosis; Intrinsic asthma, unspecified; Allergic rhinitis, cause unspecified; Cirrhosis of liver; Colon adenomas (03/08/10); Hyperplastic polyps of stomach (03/08/10); Chronic gastritis (03/09/11); History of hemorrhoids (03/08/10); Diverticula of colon (03/08/10); Esophagitis, erosive (03/08/10); GERD (gastroesophageal reflux disease); Wears glasses; Type II or unspecified type diabetes mellitus without mention of complication, not stated as uncontrolled; Vertigo; Pancreatitis; Bilateral leg weakness; Thrombocytopenia (04/12/2014); Pancytopenia, acquired (04/12/2014); Cirrhosis; Abnormal EKG; and Chronic shoulder pain.  Ms. Celestine fell on 12/24/2012 and was admitted to the hospital.  She was discharged to [facility] and released to home on 03/25/2015.  She currently has an aide that comes to her home Monday thru Friday 8-4.     How long can you sit comfortably? no problem    How long can you walk comfortably? unable    Currently in Pain? Yes   Pain Score 8    Pain Location Arm   Pain Orientation Right   Pain Descriptors / Indicators Aching;Constant   Pain Type Chronic pain   Pain Onset More than a month ago   Pain Frequency Constant            [facility] PT Assessment - 10/24/15 0001    Assessment   Medical Diagnosis Hx of multiple falls    Referring Provider Hilma Favors    Onset Date/Surgical Date 09/23/15   Next MD Visit 10/27/2015   Prior Therapy SNF- [facility]    Precautions    Precautions Fall   Restrictions   Weight Bearing Restrictions Yes   RUE Weight Bearing Non weight bearing   Balance Screen   Has the patient fallen in the past 6 months Yes   How many times? 2   Has the patient had a decrease in activity level because of a fear of falling?  Yes   Is the patient reluctant to leave their home because of a fear of falling?  Yes   [place] Private residence   Living Arrangements Spouse/significant other   Type of [place] One level   Prior Function   Level of Independence Needs assistance with ADLs;Needs assistance with homemaking  Vocation On disability   Cognition   Overall Cognitive Status Within Functional Limits for tasks assessed   Functional Tests   Functional tests Sit to Stand   Sit to Stand   Comments able to complete 4 in 35 seconds    ROM / Strength   AROM / PROM / Strength Strength   Strength   Strength Assessment Site Hip;Knee;Ankle   Right/Left Hip Right;Left   Right Hip Flexion 4/5   Right Hip Extension 3/5   Right Hip ABduction 3+/5   Left Hip Flexion 3-/5   Left Hip Extension 3-/5   Left Hip ABduction 3/5   Right/Left Knee Right;Left   Right Knee Flexion 5/5   Right Knee Extension 4+/5   Left Knee Flexion 4-/5   Left Knee Extension 4+/5   Right/Left Ankle Right;Left   Right Ankle Dorsiflexion 5/5   Left Ankle Dorsiflexion 5/5   Transfers   Transfers Sit to Stand   Sit to Stand 7: Independent   Five time sit to stand comments  unable to complete 5 able to complete 4 in 35 seconds.    Number of Reps Other reps (comment)  5                   [facility] Adult PT Treatment/Exercise - 28-Oct-2015 0001    Exercises   Exercises Knee/Hip   Knee/Hip Exercises: Standing   Other Standing Knee Exercises stand as long as able x 3    Knee/Hip Exercises: Supine   Bridges Both;10 reps   Straight Leg Raises Both;5 reps   Knee/Hip Exercises:  Sidelying   Hip ABduction Both;10 reps                PT Education - 10-28-15 1038    Education provided Yes   Education Details HEP; Educated that insurance will not cover rehabiltation beyond the patient baseline.  Pt baseline is wheelchair bound therefore her insurance will not cover therapy to attempt ambulation.    Person(s) Educated Patient;Spouse   Methods Explanation;Handout   Comprehension Verbalized understanding;Returned demonstration          PT Short Term Goals - 2015-10-28 1046    PT SHORT TERM GOAL #1   Title I in HEP to improve LE strength so that pt is able to stand longer for increased ease when patient's  husband is dressing  her.    Time 1   Period Days                  Plan - 2015-10-28 1041    Clinical Impression Statement Ms. Mcfarlan is a 74 yo female who is normally wheelchaired bound.  She has an aide that comes in to do houswork and cooking from 8-4 Monday thru Friday.  Her husband normally assists her at dressing.  She has been referred to physical therapy for evaluation and treatment.  Ms.  Bashline demonstrated the abiltiy to stand I as well as transfer with supervision.  The therapist explained that she has been wheelchair bound for two years therefore insurance will not cover therapy to attempt ambulation.  An evaluation was performed with a HEP to improve leg strength and balance so that the family and patient could work on issues that are affecting the patients mobility.     PT Next Visit Plan none          G-Codes - 10-28-2015 1048    Functional Assessment Tool Used observed ability to transfer; ability to stand for prolong  period of timde   Functional Limitation Changing and maintaining body position   Changing and Maintaining Body Position Current Status [phone]) At least 40 percent but less than 60 percent impaired, limited or restricted   Changing and Maintaining Body Position Goal Status [id number]) At least 40 percent but less than 60  percent impaired, limited or restricted   Changing and Maintaining Body Position Discharge Status [id number]) At least 40 percent but less than 60 percent impaired, limited or restricted       Problem List Patient Active Problem List   Diagnosis Date Noted  . Uncontrolled type 2 diabetes mellitus ([place]) 10/17/2015  . Fall 12/24/2014  . Encounter for examination for admission to nursing home 12/16/2014  . Hyperammonemia ([place]) 12/16/2014  . Weakness 12/15/2014  . Well controlled type 2 diabetes mellitus ([place]) 10/20/2014  . Acute pansinusitis   . Physical deconditioning 08/18/2014  . Morbid obesity ([place]) 08/18/2014  . Abdominal pain, other specified site 08/01/2014  . Elevated LFTs 07/29/2014  . Acute gallstone pancreatitis 07/28/2014  . Left-sided weakness 07/12/2014  . Dehydration 06/26/2014  . Hepatic encephalopathy ([place]) 06/26/2014  . Pancreatitis, acute 04/12/2014  . Thrombocytopenia ([place]) 04/12/2014  . Pancytopenia, acquired ([place]) 04/12/2014  . Poor balance 10/14/2013  . Repeated falls 10/14/2013  . Acute encephalopathy 07/30/2013  . Encephalopathy, hepatic ([place]) 07/30/2013  . Confusion 07/29/2013  . Edema of left lower extremity 04/14/2013  . Generalized weakness 02/19/2013  . UTI (urinary tract infection) 02/14/2013  . Encephalopathy acute 02/14/2013  . Hyponatremia 02/14/2013  . Hypokalemia 02/14/2013  . Fracture of humeral shaft, right, closed 02/11/2013  . Multiple falls 01/26/2013  . Difficulty walking 01/26/2013  . Bilateral leg weakness 01/26/2013  . Personal history of fall 09/02/2012  . Dizziness 09/02/2012  . Frequent falls 08/08/2012  . Ataxia 08/08/2012  . Peripheral neuropathy ([place]) 08/08/2012  . Preoperative clearance 06/06/2012  . 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 Anemia 04/08/2012  . Renal colic [id number]  . Encephalopathy chronic 09/14/2011  . Posterior tibial tendon dysfunction 07/12/2011  . Hyperlipidemia 05/31/2011   . Essential hypertension 05/31/2011  . Uncontrolled type 2 diabetes mellitus with peripheral neuropathy ([place]) 05/31/2011  . GERD 09/29/2010  . Other chronic nonalcoholic liver disease [id number]  . History of colonic polyps 02/13/2010  . Hepatic cirrhosis ([place]) 02/07/2010  . THROMBOCYTOPENIA, CHRONIC 01/04/2010  . PERIPHERAL NEUROPATHY 01/04/2010  . CAD, NATIVE VESSEL 01/04/2010  . ANXIETY DISORDER, HX OF 01/04/2010  . Intrinsic asthma, unspecified 03/16/2009  . ALLERGIC RHINITIS 02/18/2009  . DYSPNEA 02/18/2009  . HLD (hyperlipidemia) 02/04/2009  . OBESITY 02/04/2009  . HYPERTENSION 02/04/2009   Rayetta Humphrey, PT CLT [phone] 10/24/2015, 10:56 AM  [facility] [place], [place], [address] Phone: [phone]   Fax:  [phone]  Name: [name] [name] MRN: [id number] Date of Birth: 08-23-1941

## 2015-10-24 NOTE — Patient Instructions (Signed)
Strengthening: Straight Leg Raise (Phase 1)    Tighten muscles on front of right thigh, then lift leg __15__ inches from surface, keeping knee locked.  Repeat _10___ times per set. Do __1__ sets per session. Do __2__ sessions per day. Repeat to the left  http://orth.exer.us/614   Copyright  VHI. All rights reserved.  Strengthening: Hip Abduction (Side-Lying)    Tighten muscles on front of left thigh, then lift leg __15__ inches from surface, keeping knee locked.  Repeat ___10_ times per set. Do _1___ sets per session. Do ___2_ sessions per day. Repeat to the right  http://orth.exer.us/622   Copyright  VHI. All rights reserved.  Bridging    Slowly raise buttocks from floor, keeping stomach tight. Repeat __10__ times per set. Do __1__ sets per session. Do __2__ sessions per day.  http://orth.exer.us/1096   Copyright  VHI. All rights reserved.  Sitting to Standing    With straight back, tighten stomach, place right leg back under chair, lean slightly forward and stand. Repeat __5-10__ times per set. Do _1___ sets per session. Do _3___ sessions per day.  http://orth.exer.us/1140   Copyright  VHI. All rights reserved.

## 2015-10-26 DIAGNOSIS — Z1389 Encounter for screening for other disorder: Secondary | ICD-10-CM | POA: Diagnosis not present

## 2015-10-26 DIAGNOSIS — G894 Chronic pain syndrome: Secondary | ICD-10-CM | POA: Diagnosis not present

## 2015-10-26 DIAGNOSIS — E538 Deficiency of other specified B group vitamins: Secondary | ICD-10-CM | POA: Diagnosis not present

## 2015-10-26 DIAGNOSIS — Z6838 Body mass index (BMI) 38.0-38.9, adult: Secondary | ICD-10-CM | POA: Diagnosis not present

## 2015-11-08 ENCOUNTER — Emergency Department (HOSPITAL_COMMUNITY): Payer: Medicare Other

## 2015-11-08 ENCOUNTER — Emergency Department (HOSPITAL_COMMUNITY)
Admission: EM | Admit: 2015-11-08 | Discharge: 2015-11-08 | Disposition: A | Discharge status: Home / Self Care | Payer: Medicare Other | Attending: Emergency Medicine | Admitting: Emergency Medicine

## 2015-11-08 ENCOUNTER — Encounter (HOSPITAL_COMMUNITY): Payer: Self-pay | Admitting: *Deleted

## 2015-11-08 DIAGNOSIS — I10 Essential (primary) hypertension: Secondary | ICD-10-CM | POA: Insufficient documentation

## 2015-11-08 DIAGNOSIS — W19XXXA Unspecified fall, initial encounter: Secondary | ICD-10-CM

## 2015-11-08 DIAGNOSIS — G8929 Other chronic pain: Secondary | ICD-10-CM | POA: Diagnosis not present

## 2015-11-08 DIAGNOSIS — Z86018 Personal history of other benign neoplasm: Secondary | ICD-10-CM | POA: Insufficient documentation

## 2015-11-08 DIAGNOSIS — Y998 Other external cause status: Secondary | ICD-10-CM | POA: Diagnosis not present

## 2015-11-08 DIAGNOSIS — R079 Chest pain, unspecified: Secondary | ICD-10-CM | POA: Diagnosis not present

## 2015-11-08 DIAGNOSIS — E7220 Disorder of urea cycle metabolism, unspecified: Secondary | ICD-10-CM | POA: Diagnosis not present

## 2015-11-08 DIAGNOSIS — Y92009 Unspecified place in unspecified non-institutional (private) residence as the place of occurrence of the external cause: Secondary | ICD-10-CM | POA: Diagnosis not present

## 2015-11-08 DIAGNOSIS — E669 Obesity, unspecified: Secondary | ICD-10-CM | POA: Diagnosis not present

## 2015-11-08 DIAGNOSIS — Z8719 Personal history of other diseases of the digestive system: Secondary | ICD-10-CM | POA: Diagnosis not present

## 2015-11-08 DIAGNOSIS — S29009A Unspecified injury of muscle and tendon of unspecified wall of thorax, initial encounter: Secondary | ICD-10-CM | POA: Insufficient documentation

## 2015-11-08 DIAGNOSIS — N390 Urinary tract infection, site not specified: Secondary | ICD-10-CM | POA: Diagnosis not present

## 2015-11-08 DIAGNOSIS — Z7982 Long term (current) use of aspirin: Secondary | ICD-10-CM | POA: Insufficient documentation

## 2015-11-08 DIAGNOSIS — Y9389 Activity, other specified: Secondary | ICD-10-CM | POA: Diagnosis not present

## 2015-11-08 DIAGNOSIS — J45909 Unspecified asthma, uncomplicated: Secondary | ICD-10-CM | POA: Diagnosis not present

## 2015-11-08 DIAGNOSIS — I2510 Atherosclerotic heart disease of native coronary artery without angina pectoris: Secondary | ICD-10-CM | POA: Insufficient documentation

## 2015-11-08 DIAGNOSIS — Z794 Long term (current) use of insulin: Secondary | ICD-10-CM | POA: Diagnosis not present

## 2015-11-08 DIAGNOSIS — W01198A Fall on same level from slipping, tripping and stumbling with subsequent striking against other object, initial encounter: Secondary | ICD-10-CM | POA: Insufficient documentation

## 2015-11-08 DIAGNOSIS — E119 Type 2 diabetes mellitus without complications: Secondary | ICD-10-CM | POA: Diagnosis not present

## 2015-11-08 DIAGNOSIS — Z79899 Other long term (current) drug therapy: Secondary | ICD-10-CM | POA: Diagnosis not present

## 2015-11-08 DIAGNOSIS — Z973 Presence of spectacles and contact lenses: Secondary | ICD-10-CM | POA: Diagnosis not present

## 2015-11-08 DIAGNOSIS — Z862 Personal history of diseases of the blood and blood-forming organs and certain disorders involving the immune mechanism: Secondary | ICD-10-CM | POA: Diagnosis not present

## 2015-11-08 LAB — URINALYSIS, ROUTINE W REFLEX MICROSCOPIC
Bilirubin Urine: NEGATIVE
GLUCOSE, UA: 250 mg/dL — AB
Ketones, ur: NEGATIVE mg/dL
Nitrite: POSITIVE — AB
PH: 6 (ref 5.0–8.0)
PROTEIN: NEGATIVE mg/dL

## 2015-11-08 LAB — AMMONIA: AMMONIA: 54 umol/L — AB (ref 9–35)

## 2015-11-08 LAB — CBC WITH DIFFERENTIAL/PLATELET
BASOS ABS: 0 10*3/uL (ref 0.0–0.1)
Basophils Relative: 0 %
Eosinophils Absolute: 0.1 10*3/uL (ref 0.0–0.7)
Eosinophils Relative: 2 %
HEMATOCRIT: 35.9 % — AB (ref 36.0–46.0)
HEMOGLOBIN: 11.3 g/dL — AB (ref 12.0–15.0)
LYMPHS ABS: 0.9 10*3/uL (ref 0.7–4.0)
LYMPHS PCT: 19 %
MCH: 24.1 pg — AB (ref 26.0–34.0)
MCHC: 31.5 g/dL (ref 30.0–36.0)
MCV: 76.7 fL — AB (ref 78.0–100.0)
Monocytes Absolute: 0.5 10*3/uL (ref 0.1–1.0)
Monocytes Relative: 10 %
NEUTROS ABS: 3.2 10*3/uL (ref 1.7–7.7)
Neutrophils Relative %: 69 %
Platelets: 127 10*3/uL — ABNORMAL LOW (ref 150–400)
RBC: 4.68 MIL/uL (ref 3.87–5.11)
RDW: 14.8 % (ref 11.5–15.5)
WBC: 4.7 10*3/uL (ref 4.0–10.5)

## 2015-11-08 LAB — COMPREHENSIVE METABOLIC PANEL
ALK PHOS: 87 U/L (ref 38–126)
ALT: 11 U/L — AB (ref 14–54)
AST: 16 U/L (ref 15–41)
Albumin: 3.3 g/dL — ABNORMAL LOW (ref 3.5–5.0)
Anion gap: 7 (ref 5–15)
BILIRUBIN TOTAL: 1.1 mg/dL (ref 0.3–1.2)
BUN: 13 mg/dL (ref 6–20)
CALCIUM: 8.6 mg/dL — AB (ref 8.9–10.3)
CHLORIDE: 107 mmol/L (ref 101–111)
CO2: 24 mmol/L (ref 22–32)
CREATININE: 0.6 mg/dL (ref 0.44–1.00)
GFR calc Af Amer: >60 mL/min (ref >60)
Glucose, Bld: 239 mg/dL — ABNORMAL HIGH (ref 65–99)
Potassium: 3.8 mmol/L (ref 3.5–5.1)
Sodium: 138 mmol/L (ref 135–145)
TOTAL PROTEIN: 6 g/dL — AB (ref 6.5–8.1)

## 2015-11-08 LAB — URINE MICROSCOPIC-ADD ON

## 2015-11-08 MED ORDER — DOXYCYCLINE HYCLATE 100 MG PO CAPS: 100.0000 mg | ORAL_CAPSULE | Freq: Two times a day (BID) | ORAL | Status: DC

## 2015-11-08 MED ORDER — SULFAMETHOXAZOLE-TRIMETHOPRIM 800-160 MG PO TABS
1.0000 | ORAL_TABLET | Freq: Once | ORAL | Status: AC
  Administered 2015-11-08: 1 via ORAL
  Filled 2015-11-08: qty 1

## 2015-11-08 MED ORDER — SULFAMETHOXAZOLE-TRIMETHOPRIM 800-160 MG PO TABS: 1.0000 | ORAL_TABLET | Freq: Two times a day (BID) | ORAL | Status: DC

## 2015-11-08 MED ORDER — DOXYCYCLINE HYCLATE 100 MG PO TABS
100.0000 mg | ORAL_TABLET | Freq: Once | ORAL | Status: AC
  Administered 2015-11-08: 100 mg via ORAL
  Filled 2015-11-08: qty 1

## 2015-11-08 MED ORDER — OXYCODONE HCL 5 MG PO TABS
5.0000 mg | ORAL_TABLET | Freq: Once | ORAL | Status: AC
  Administered 2015-11-08: 5 mg via ORAL
  Filled 2015-11-08: qty 1

## 2015-11-08 NOTE — Discharge Instructions (Signed)
Your last 3 urinary tract infections have shown 2 different antibiotics. You are being started on 2 antibiotics to cover for both of these bacteria types. Call your doctor on Friday to check culture results to determine which of the antibiotics to continue.   Urinary Tract Infection A urinary tract infection (UTI) can occur any place along the urinary tract. The tract includes the kidneys, ureters, bladder, and urethra. A type of germ called bacteria often causes a UTI. UTIs are often helped with antibiotic medicine.  HOME CARE   If given, take antibiotics as told by your doctor. Finish them even if you start to feel better.  Drink enough fluids to keep your pee (urine) clear or pale yellow.  Avoid tea, drinks with caffeine, and bubbly (carbonated) drinks.  Pee often. Avoid holding your pee in for a long time.  Pee before and after having sex (intercourse).  Wipe from front to back after you poop (bowel movement) if you are a woman. Use each tissue only once. GET HELP RIGHT AWAY IF:   You have back pain.  You have lower belly (abdominal) pain.  You have chills.  You feel sick to your stomach (nauseous).  You throw up (vomit).  Your burning or discomfort with peeing does not go away.  You have a fever.  Your symptoms are not better in 3 days. MAKE SURE YOU:   Understand these instructions.  Will watch your condition.  Will get help right away if you are not doing well or get worse.   This information is not intended to replace advice given to you by your health care provider. Make sure you discuss any questions you have with your health care provider.   Document Released: 04/16/2008 Document Revised: 11/19/2014 Document Reviewed: 05/29/2012 Elsevier Interactive Patient Education [employer].

## 2015-11-08 NOTE — ED Notes (Signed)
Pt c/o trying to switch from one chair to another and left knee "gave out" and reports falling and hitting head on wall. Denies LOC. Also c/o left arm and left chest/rib pain.

## 2015-11-08 NOTE — ED Provider Notes (Signed)
CSN: [id number]     Arrival date & time 11/08/15  1714 History  By signing my name below, I, Emmanuella Mensah, attest that this documentation has been prepared under the direction and in the presence of Tanna Furry, MD. Electronically Signed: Judithann Sauger, ED Scribe. 11/08/2015. 8:46 PM.    Chief Complaint  Patient presents with  . Fall   The history is provided by the patient. No language interpreter was used.   HPI Comments: [name] [name] is a 74 y.o. female with a hx of CAD, DM II, and bilateral leg weakness who presents to the Emergency Department complaining of gradually worsening constant moderate right shoulder pain, left CP and left rib pain s/p fall that occurred at home PTA. She explains that she was moving from one chair to another chair when she fell, hitting the wall with her left forehead and sliding to the floor. She denies any LOC. She also denies any pain to her forehead or head injuries. Pt is currently on baby aspirin. She states that she had her left shoulder repaired several years ago but re-tore it approx 3 years ago. She reports an allergy to Morphine and Compazine. She adds that she does not take Tylenol due to her liver. She deneis any bleeding, abdominal pain, n/v/d, or SOB.   Past Medical History  Diagnosis Date  . CAD in native artery   . Thrombocytopenia, unspecified ([place])   . Unspecified hereditary and idiopathic peripheral neuropathy   . Unspecified fall   . Other and unspecified hyperlipidemia   . Unspecified essential hypertension   . Obesity, unspecified   . Personal history of neurosis   . Intrinsic asthma, unspecified   . Allergic rhinitis, cause unspecified   . Cirrhosis of liver ([facility])     NASH, afp on 06/17/12 =3.7, per pt she had hep B vaccines in 1996, hep A in process.   . Colon adenomas 03/08/10    tcs by Dr. Gala Romney  . Hyperplastic polyps of stomach 03/08/10    tcs by Dr. Dudley Major  . Chronic gastritis 03/09/11    egd by Dr. Gala Romney  . History of  hemorrhoids 03/08/10    tcs- internal and external  . Diverticula of colon 03/08/10    L side  . Esophagitis, erosive 03/08/10  . GERD (gastroesophageal reflux disease)   . Wears glasses   . Type II or unspecified type diabetes mellitus without mention of complication, not stated as uncontrolled   . Vertigo   . Pancreatitis   . Bilateral leg weakness   . Thrombocytopenia ([place]) 04/12/2014  . Pancytopenia, acquired ([place]) 04/12/2014  . Cirrhosis ([place])   . Abnormal EKG     hx of left anterior fasicular block on 04-09-13 ekg epic  . Chronic shoulder pain    Past Surgical History  Procedure Laterality Date  . Hysterectomy and btl      s/p  . Tumor excision  2003    rt arm and left foot  . Colonoscopy  03/08/10    Dr. [name]-->ext/int hemorrhoids, anal paipilla, rectal polyps, desc polyps, cecal polyp, left-sided diverticula. suboptiomal prep. next TCS 02/2015. multiple adenomas  . Esophagogastroduodenoscopy  03/08/10    Dr. [name] erosive RE, small hh, antral erosions, small 1cm are of mucosal indentation along gastric body of doubtful significance, cystic nodularity of hypophyarynx, base of tongue, base of epiglottis. benign gastric biopsies  . Tubal ligation    . Esophagogastroduodenoscopy  10/08/2011    Dr. [name] esophageal varices, antral erosion.  next egd 09/2013  . Foot surgery  2003    lt foot  . Eye surgery      cataracts bilateral  . Orif humerus fracture Right 02/17/2013    Procedure: OPEN REDUCTION INTERNAL FIXATION (ORIF) RIGHT PROXIMAL HUMERUS FRACTURE;  Surgeon: [name], MD;  Location: [facility];  Service: Orthopedics;  Laterality: Right;  . Partial gastrectomy      family denies  . Esophagogastroduodenoscopy N/A 02/17/2014    Dr. Gala Romney: portal gastropathy, no varices. Due for surveillance 2017  . Breast surgery Right few yrs ago    benign area removed  . Abdominal hysterectomy    . Cardiac catheterization  02/25/2007    Dr. [name]:  normal LV systolic function, mild  irregularities of LAD   Family History  Problem Relation Age of Onset  . Coronary artery disease      FH  . Diabetes      FH  . Heart disease Mother   . Colon cancer Neg Hx    Social History  Substance Use Topics  . Smoking status: Never Smoker   . Smokeless tobacco: Never Used  . Alcohol Use: No   OB History    No data available     Review of Systems  Constitutional: Negative for fever, chills, diaphoresis, appetite change and fatigue.  HENT: Negative for mouth sores, sore throat and trouble swallowing.   Eyes: Negative for visual disturbance.  Respiratory: Negative for cough, chest tightness, shortness of breath and wheezing.   Cardiovascular: Positive for chest pain.  Gastrointestinal: Negative for nausea, vomiting, abdominal pain, diarrhea and abdominal distention.  Endocrine: Negative for polydipsia, polyphagia and polyuria.  Genitourinary: Negative for dysuria, frequency and hematuria.  Musculoskeletal: Positive for arthralgias. Negative for gait problem.  Skin: Negative for color change, pallor and rash.  Neurological: Negative for dizziness, syncope, light-headedness and headaches.  Hematological: Does not bruise/bleed easily.  Psychiatric/Behavioral: Negative for behavioral problems and confusion.      Allergies  Morphine and related and Compazine  Home Medications   Prior to Admission medications   Medication Sig Start Date End Date Taking? Authorizing Provider  ALPRAZolam Duanne Moron) 0.5 MG tablet Take 1 tablet (0.5 mg total) by mouth 3 (three) times daily as needed for anxiety. 12/24/14   [name], MD  aspirin EC 325 MG EC tablet Take 1 tablet (325 mg total) by mouth every morning. 07/14/14   Nita Sells, MD  blood glucose meter kit and supplies KIT Dispense based on patient and insurance preference. Use up to four times daily as directed. (FOR ICD-10 E11.65). 10/17/15   Cassandria Anger, MD  budesonide-formoterol (SYMBICORT) 160-4.5 MCG/ACT  inhaler Inhale 2 puffs into the lungs at bedtime.     Historical Provider, MD  doxycycline (VIBRAMYCIN) 100 MG capsule Take 1 capsule (100 mg total) by mouth 2 (two) times daily. 11/08/15   Tanna Furry, MD  enalapril (VASOTEC) 5 MG tablet Take 1 tablet by mouth daily. 08/25/14   Historical Provider, MD  ESTRACE VAGINAL 0.1 MG/GM vaginal cream Place 1 Applicatorful vaginally every other day. At  bedtime 08/11/13   Historical Provider, MD  furosemide (LASIX) 20 MG tablet Take 1 tablet (20 mg total) by mouth every morning. 09/03/14   Mahala Menghini, PA-C  glucose blood (BAYER CONTOUR TEST) test strip Use as instructed 10/10/15   Cassandria Anger, MD  insulin aspart (NOVOLOG FLEXPEN) 100 UNIT/ML FlexPen Inject 10-16 Units into the skin 3 (three) times daily with meals. 10/10/15  Cassandria Anger, MD  insulin detemir (LEVEMIR) 100 UNIT/ML injection Inject 0.6 mLs (60 Units total) into the skin at bedtime. 10/10/15   Cassandria Anger, MD  insulin lispro (HUMALOG KWIKPEN) 100 UNIT/ML KiwkPen Inject 0.1-0.16 mLs (10-16 Units total) into the skin 3 (three) times daily. 10/14/15   Cassandria Anger, MD  Insulin Pen Needle (B-D ULTRAFINE III SHORT PEN) 31G X 8 MM MISC 1 each by Does not apply route as directed. 10/10/15   Cassandria Anger, MD  lactulose (CHRONULAC) 10 GM/15ML solution Take 60 mLs by mouth 2 (two) times daily. 10/20/14   Historical Provider, MD  meclizine (ANTIVERT) 25 MG tablet Take 25 mg by mouth 3 (three) times daily as needed for dizziness or nausea.     Historical Provider, MD  Multiple Vitamins-Minerals (CENTRUM SILVER PO) Take 1 tablet by mouth daily.     Historical Provider, MD  NEXIUM 40 MG capsule Take 40 mg by mouth every morning.  06/17/14   Historical Provider, MD  ondansetron (ZOFRAN ODT) 4 MG disintegrating tablet Take 1 tablet (4 mg total) by mouth every 8 (eight) hours as needed for nausea or vomiting. 01/18/15   Rolland Porter, MD  ondansetron (ZOFRAN) 4 MG tablet Take 1  tablet (4 mg total) by mouth every 6 (six) hours as needed for nausea. 08/06/14   Radene Gunning, NP  OxyCODONE (OXYCONTIN) 10 mg T12A 12 hr tablet Take 1 tablet (10 mg total) by mouth 2 (two) times daily as needed (severe pain). 12/24/14   [name], MD  pregabalin (LYRICA) 100 MG capsule Take 100 mg by mouth 2 (two) times daily.    Historical Provider, MD  rifaximin (XIFAXAN) 550 MG TABS tablet Take 1 tablet (550 mg total) by mouth 2 (two) times daily. 06/28/14   Kathie Dike, MD  spironolactone (ALDACTONE) 50 MG tablet Take 1 tablet (50 mg total) by mouth every morning. 10/19/14   Mahala Menghini, PA-C  sulfamethoxazole-trimethoprim (BACTRIM DS,SEPTRA DS) 800-160 MG tablet Take 1 tablet by mouth 2 (two) times daily. 11/08/15   Tanna Furry, MD  triamcinolone cream (KENALOG) 0.1 % Apply 1 application topically daily as needed (for irritation).  06/26/13   Historical Provider, MD  ursodiol (URSO FORTE) 500 MG tablet Take 500 mg by mouth 2 (two) times daily.    Historical Provider, MD   BP 124/63 mmHg  Pulse 82  Temp(Src) 99.1 F (37.3 C) (Temporal)  Resp 14  Ht _0  (1.727 m)  Wt 230 lb (104.327 kg)  BMI 34.98 kg/m2  SpO2 97% Physical Exam  Constitutional: She is oriented to person, place, and time. She appears well-developed and well-nourished. No distress.  HENT:  Head: Normocephalic.  Indicates left forehead is site of impact but denies any pain.   Eyes: Conjunctivae are normal. Pupils are equal, round, and reactive to light. No scleral icterus.  Neck: Normal range of motion. Neck supple. No thyromegaly present.  Cardiovascular: Normal rate and regular rhythm.  Exam reveals no gallop and no friction rub.   No murmur heard. Pulmonary/Chest: Effort normal and breath sounds normal. No respiratory distress. She has no wheezes. She has no rales.  Abdominal: Soft. Bowel sounds are normal. She exhibits no distension. There is no tenderness. There is no rebound.  Musculoskeletal: Normal  range of motion.  No obvious swelling or bruising but struck Left frontal forehead.    Neurological: She is alert and oriented to person, place, and time.  Skin: Skin is warm  and dry. No rash noted.  Psychiatric: She has a normal mood and affect. Her behavior is normal.  Nursing note and vitals reviewed.   ED Course  Procedures (including critical care time) DIAGNOSTIC STUDIES: Oxygen Saturation is 98% on RA, normal by my interpretation.    COORDINATION OF CARE: 8:38 PM- Pt advised of plan for treatment and pt agrees. Pt will receive plain oxycodone. She will also receive a chest x-ray for further evaluation.    Labs Review Labs Reviewed  CBC WITH DIFFERENTIAL/PLATELET - Abnormal; Notable for the following:    Hemoglobin 11.3 (*)    HCT 35.9 (*)    MCV 76.7 (*)    MCH 24.1 (*)    Platelets 127 (*)    All other components within normal limits  COMPREHENSIVE METABOLIC PANEL - Abnormal; Notable for the following:    Glucose, Bld 239 (*)    Calcium 8.6 (*)    Total Protein 6.0 (*)    Albumin 3.3 (*)    ALT 11 (*)    All other components within normal limits  URINALYSIS, ROUTINE W REFLEX MICROSCOPIC (NOT AT [facility]) - Abnormal; Notable for the following:    APPearance HAZY (*)    Specific Gravity, Urine >1.030 (*)    Glucose, UA 250 (*)    Hgb urine dipstick TRACE (*)    Nitrite POSITIVE (*)    Leukocytes, UA SMALL (*)    All other components within normal limits  AMMONIA - Abnormal; Notable for the following:    Ammonia 54 (*)    All other components within normal limits  URINE MICROSCOPIC-ADD ON - Abnormal; Notable for the following:    Squamous Epithelial / LPF 6-30 (*)    Bacteria, UA MANY (*)    All other components within normal limits  URINE CULTURE    Imaging Review Dg Chest 2 View  11/08/2015  CLINICAL DATA:  Acute chest pain after fall today. EXAM: CHEST  2 VIEW COMPARISON:  December 23, 2014. FINDINGS: Mild cardiomegaly is noted. No pneumothorax or pleural  effusion is noted. No acute pulmonary disease is noted. Status post surgical fixation of proximal right humeral fracture. IMPRESSION: No active cardiopulmonary disease. Electronically Signed   By: Marijo Conception, M.D.   On: 11/08/2015 18:13   Tanna Furry, MD has personally reviewed and evaluated these images and lab results as part of his medical decision-making.   MDM   Final diagnoses:  Fall, initial encounter  UTI (lower urinary tract infection)  Hyperammonemia ([place])    Discussed all the results with patient and family. When I told him her ammonia was 54 they all stated that was "not bad". She does not seem encephalopathic. She is awake and alert lucid. She history tract infection. This is happened to her in the past. We discussed admission versus home. Patient, husband, and daughter all feel that she is doing well at home and they have daily nursing for her 8 hours during the day. I have reviewed her most recent culture results which have shown enterococcus resistant to ampicillin, sensitive to tetracycline. An Escherichia coli sensitive to Bactrim, resistant to cephalosporins. Given doctor, and Bactrim by mouth. Prescriptions for the same. I discussed this and explained this to them at length. I've asked him to call their physician on Friday to check culture results to see if she may be able to stop one of the 2 antibiotics to avoid untoward GI effects.  I had asked him to return here with any  worsening at home.   I personally performed the services described in this documentation, which was scribed in my presence. The recorded information has been reviewed and is accurate.   Tanna Furry, MD 11/08/15 2231

## 2015-11-10 LAB — URINE CULTURE

## 2015-11-12 ENCOUNTER — Other Ambulatory Visit: Payer: Self-pay | Admitting: "Endocrinology

## 2015-11-15 DIAGNOSIS — R69 Illness, unspecified: Secondary | ICD-10-CM | POA: Diagnosis not present

## 2015-11-17 DIAGNOSIS — N342 Other urethritis: Secondary | ICD-10-CM | POA: Diagnosis not present

## 2015-11-17 DIAGNOSIS — R350 Frequency of micturition: Secondary | ICD-10-CM | POA: Diagnosis not present

## 2015-11-17 DIAGNOSIS — Z1389 Encounter for screening for other disorder: Secondary | ICD-10-CM | POA: Diagnosis not present

## 2015-11-18 DIAGNOSIS — Z1389 Encounter for screening for other disorder: Secondary | ICD-10-CM | POA: Diagnosis not present

## 2015-11-18 DIAGNOSIS — N342 Other urethritis: Secondary | ICD-10-CM | POA: Diagnosis not present

## 2015-11-21 DIAGNOSIS — G894 Chronic pain syndrome: Secondary | ICD-10-CM | POA: Diagnosis not present

## 2015-11-21 DIAGNOSIS — E1142 Type 2 diabetes mellitus with diabetic polyneuropathy: Secondary | ICD-10-CM | POA: Diagnosis not present

## 2015-11-21 DIAGNOSIS — M150 Primary generalized (osteo)arthritis: Secondary | ICD-10-CM | POA: Diagnosis not present

## 2015-11-27 IMAGING — US US ABDOMEN COMPLETE
1 series · 13 of 25 positions shown · non-contrast
Comparison: Abdominal ultrasound November 19, 2013

CLINICAL DATA: Hepatic cirrhosis, history of pancreatitis, chronic
gastritis, and diabetes

EXAM:
ULTRASOUND ABDOMEN COMPLETE

[Series 1: us abdomen complete · 0.20mm/px · 13 of 123 slices shown]
[im 1/123]
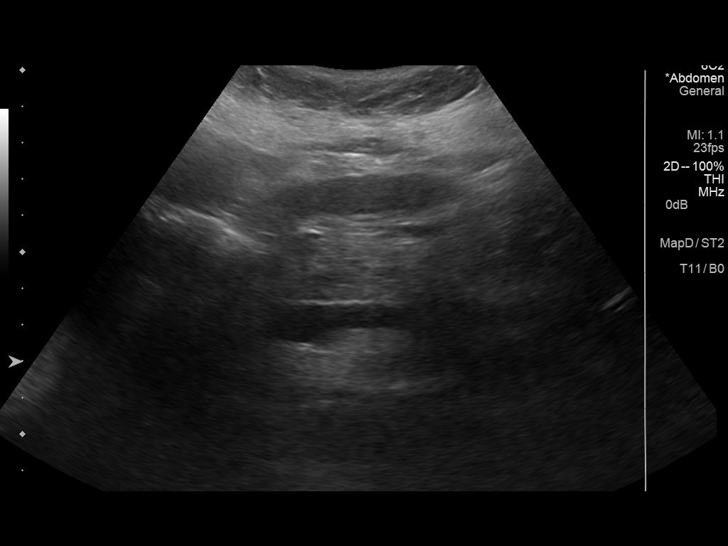
[im 11/123]
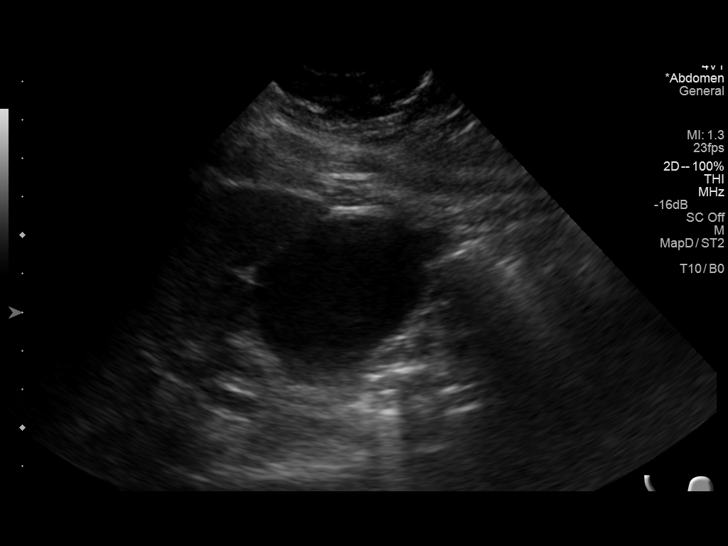
[im 21/123]
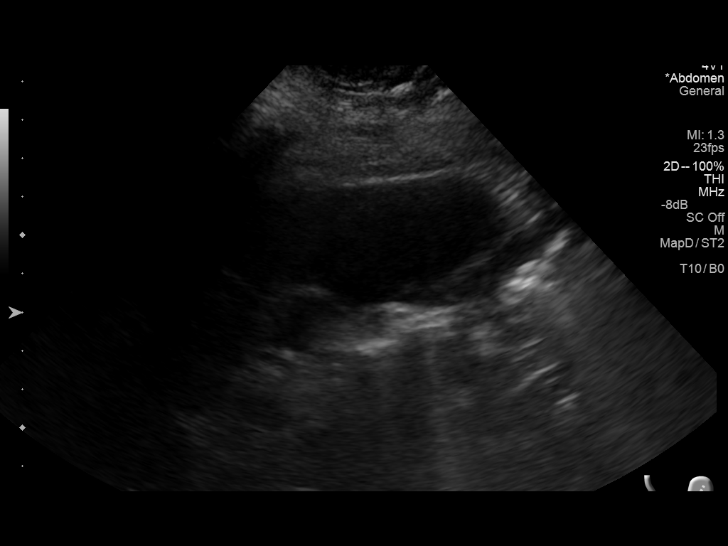
[im 31/123]
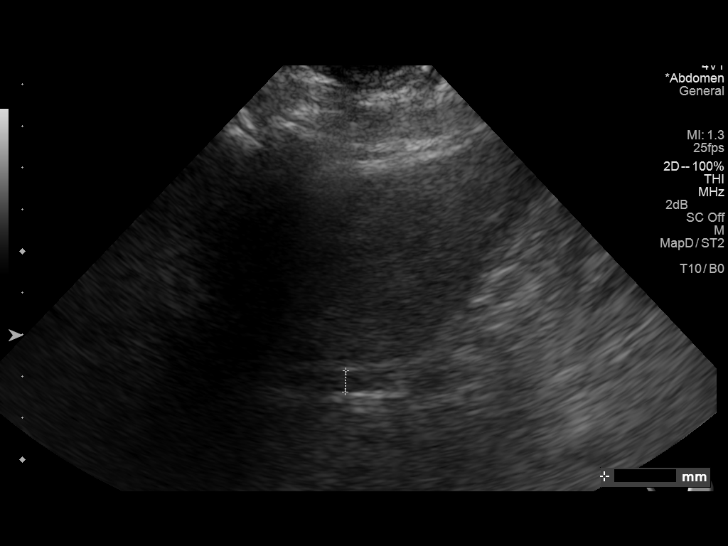
[im 41/123]
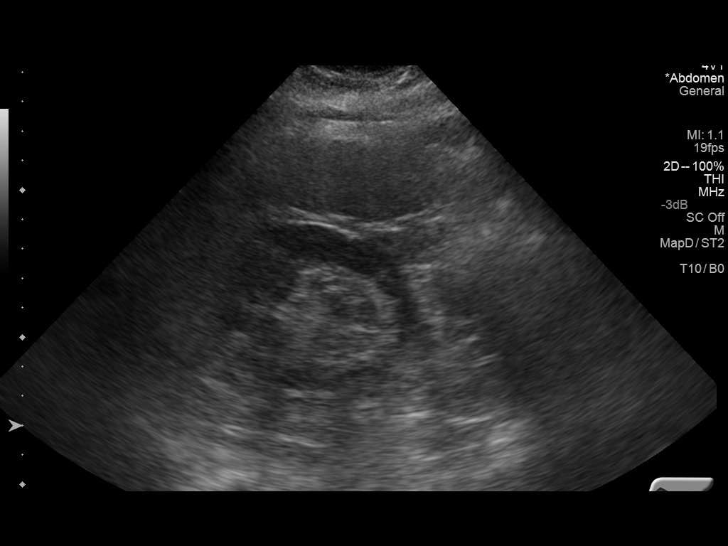
[im 51/123]
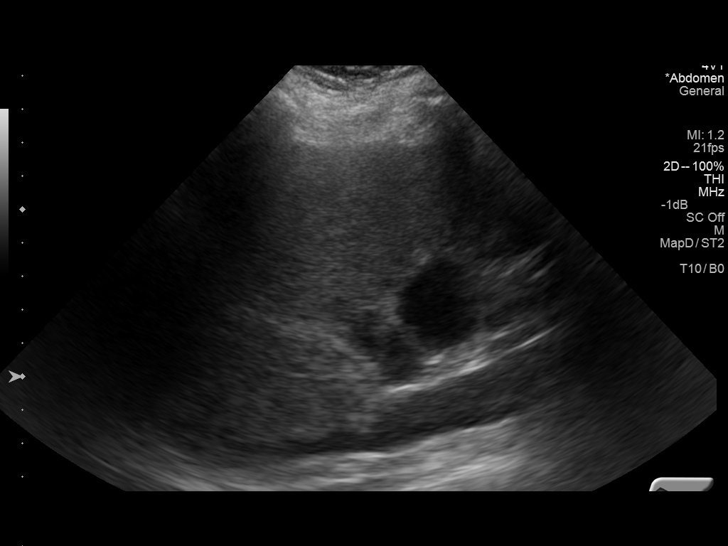
[im 62/123]
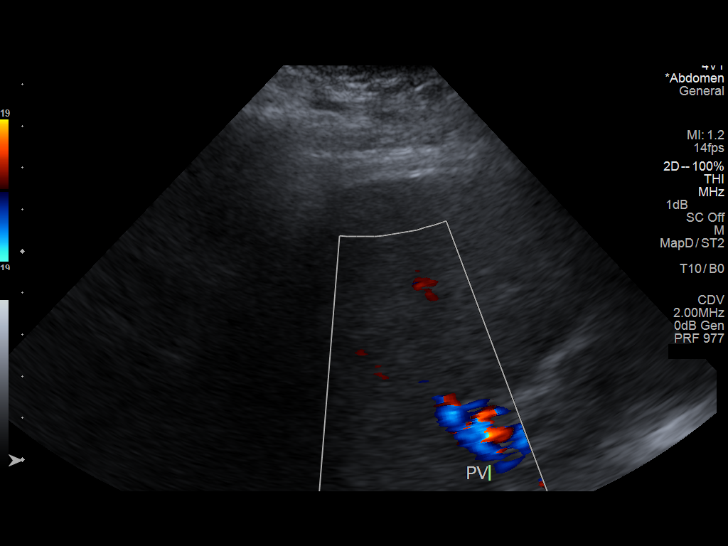
[im 72/123]
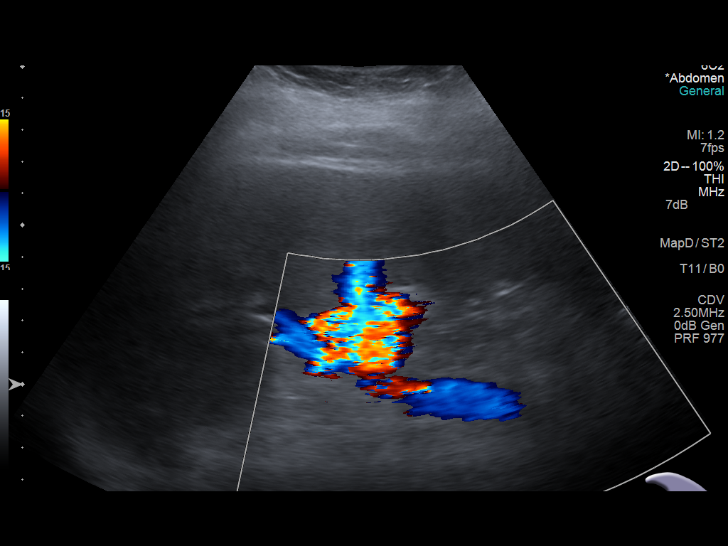
[im 82/123]
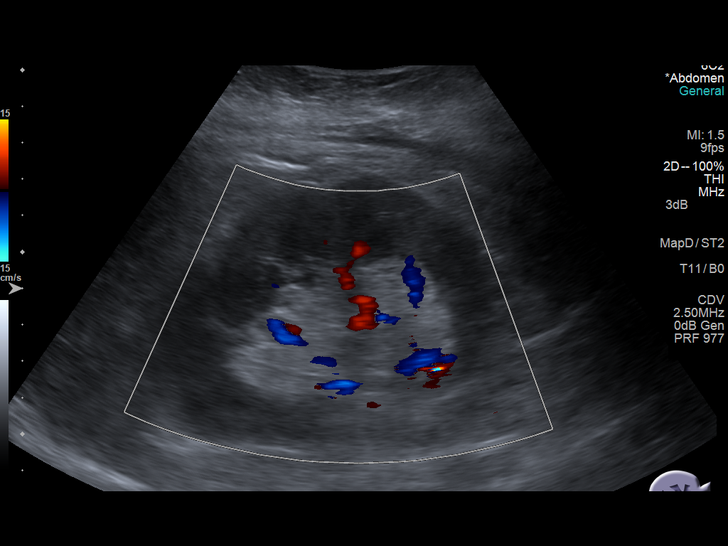
[im 92/123]
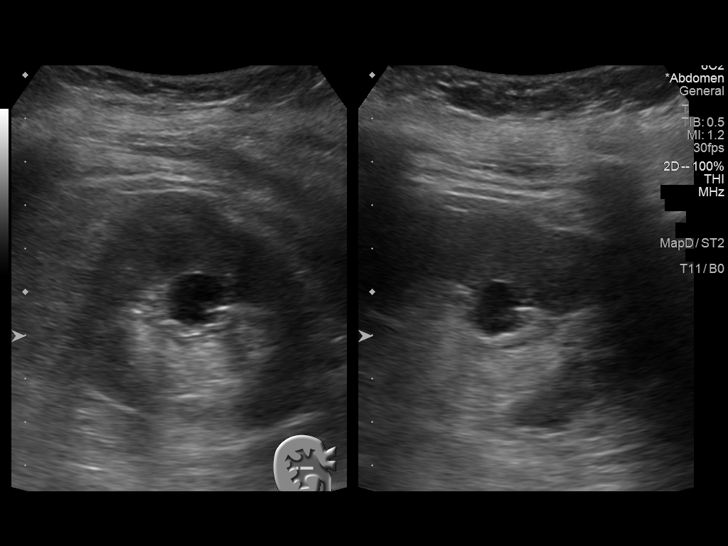
[im 102/123]
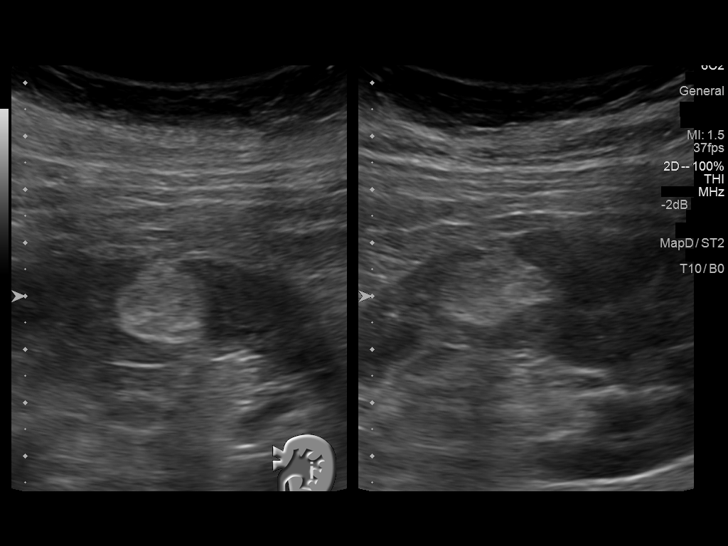
[im 112/123]
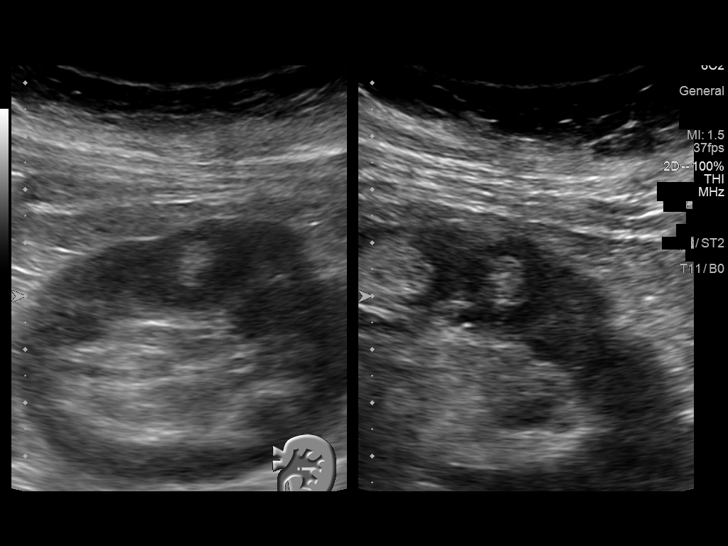
[im 123/123]
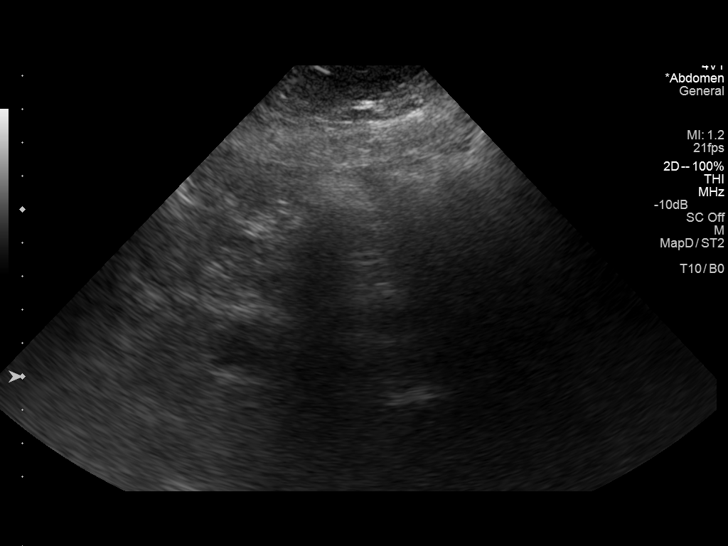

[13 of 25 positions shown; findings below may reference images not displayed]

FINDINGS: Gallbladder: There are gallstones it in the gallbladder neck. There
is no gallbladder wall thickening, pericholecystic fluid, or
positive sonographic Murphy's sign.

Common bile duct: Diameter: 5.1 mm

Liver: The hepatic echotexture is mildly increased. There is no
focal mass or ductal dilation. Reversal flow in the hepatic and
portal vein is again demonstrated consistent with portal
hypertension. There is mild surface irregularity of the liver.

IVC: No abnormality visualized.

Pancreas: The pancreatic body is normal where visualized. The
pancreatic head and tail were obscured by bowel gas.

Spleen: There is mild splenomegaly with a calculated volume of 532
cc

Right Kidney: Length: 10.9 cm. There is a 1.4 cm diameter midpole
cyst. There is no hydronephrosis. The renal cortical echotexture is
normal.

Left Kidney: Length: 13.4 cm. In the left kidney there are
hyperechoic foci. In the midpole a previously demonstrated
hyperechoic non shadowing structure measures 2.2 x 1.7 x 1.9 cm. In
the lower pole there is a 1 cm diameter hyperechoic focus. There is
no hydronephrosis.

Abdominal aorta: The abdominal aorta tapers normally. The maximal
measured diameter is 3 cm proximally.

Other findings: No ascites is observed.
IMPRESSION: 1. Gallstones are again demonstrated. There is no sonographic
evidence of acute cholecystitis.
2. Cirrhotic changes within the liver with evidence of portal
hypertension. There is no focal hepatic mass.
3. Splenomegaly.
4. Two hyperechoic foci in the left kidney likely reflecting
angiomyolipomas. Fatty foci were noted on the CT scan [DATE],

## 2015-12-05 DIAGNOSIS — J069 Acute upper respiratory infection, unspecified: Secondary | ICD-10-CM | POA: Diagnosis not present

## 2015-12-05 DIAGNOSIS — Z1389 Encounter for screening for other disorder: Secondary | ICD-10-CM | POA: Diagnosis not present

## 2015-12-05 DIAGNOSIS — G894 Chronic pain syndrome: Secondary | ICD-10-CM | POA: Diagnosis not present

## 2015-12-05 DIAGNOSIS — J209 Acute bronchitis, unspecified: Secondary | ICD-10-CM | POA: Diagnosis not present

## 2015-12-13 DIAGNOSIS — M150 Primary generalized (osteo)arthritis: Secondary | ICD-10-CM | POA: Diagnosis not present

## 2015-12-13 DIAGNOSIS — E1142 Type 2 diabetes mellitus with diabetic polyneuropathy: Secondary | ICD-10-CM | POA: Diagnosis not present

## 2015-12-13 DIAGNOSIS — J209 Acute bronchitis, unspecified: Secondary | ICD-10-CM | POA: Diagnosis not present

## 2015-12-13 DIAGNOSIS — Z1389 Encounter for screening for other disorder: Secondary | ICD-10-CM | POA: Diagnosis not present

## 2015-12-13 DIAGNOSIS — G894 Chronic pain syndrome: Secondary | ICD-10-CM | POA: Diagnosis not present

## 2015-12-13 DIAGNOSIS — D519 Vitamin B12 deficiency anemia, unspecified: Secondary | ICD-10-CM | POA: Diagnosis not present

## 2015-12-18 DIAGNOSIS — E1142 Type 2 diabetes mellitus with diabetic polyneuropathy: Secondary | ICD-10-CM | POA: Diagnosis not present

## 2015-12-18 DIAGNOSIS — I10 Essential (primary) hypertension: Secondary | ICD-10-CM | POA: Diagnosis not present

## 2015-12-18 DIAGNOSIS — I2510 Atherosclerotic heart disease of native coronary artery without angina pectoris: Secondary | ICD-10-CM | POA: Diagnosis not present

## 2015-12-18 DIAGNOSIS — K7460 Unspecified cirrhosis of liver: Secondary | ICD-10-CM | POA: Diagnosis not present

## 2015-12-21 ENCOUNTER — Ambulatory Visit: Payer: Medicare Other | Admitting: "Endocrinology

## 2015-12-23 ENCOUNTER — Other Ambulatory Visit: Payer: Self-pay | Admitting: "Endocrinology

## 2015-12-26 ENCOUNTER — Other Ambulatory Visit: Payer: Self-pay | Admitting: "Endocrinology

## 2015-12-26 DIAGNOSIS — E1165 Type 2 diabetes mellitus with hyperglycemia: Secondary | ICD-10-CM | POA: Diagnosis not present

## 2015-12-26 DIAGNOSIS — L600 Ingrowing nail: Secondary | ICD-10-CM | POA: Diagnosis not present

## 2015-12-26 DIAGNOSIS — E1142 Type 2 diabetes mellitus with diabetic polyneuropathy: Secondary | ICD-10-CM | POA: Diagnosis not present

## 2015-12-26 DIAGNOSIS — L03032 Cellulitis of left toe: Secondary | ICD-10-CM | POA: Diagnosis not present

## 2015-12-26 DIAGNOSIS — M79675 Pain in left toe(s): Secondary | ICD-10-CM | POA: Diagnosis not present

## 2015-12-26 LAB — BASIC METABOLIC PANEL
BUN: 13 mg/dL (ref 7–25)
CALCIUM: 8.7 mg/dL (ref 8.6–10.4)
CO2: 27 mmol/L (ref 20–31)
Chloride: 106 mmol/L (ref 98–110)
Creat: 0.65 mg/dL (ref 0.60–0.93)
GLUCOSE: 139 mg/dL — AB (ref 65–99)
POTASSIUM: 4.6 mmol/L (ref 3.5–5.3)
SODIUM: 140 mmol/L (ref 135–146)

## 2015-12-26 LAB — HEMOGLOBIN A1C
HEMOGLOBIN A1C: 7.1 % — AB (ref <5.7)
MEAN PLASMA GLUCOSE: 157 mg/dL — AB (ref <117)

## 2015-12-28 ENCOUNTER — Encounter: Payer: Self-pay | Admitting: Internal Medicine

## 2016-01-10 ENCOUNTER — Ambulatory Visit: Payer: Medicare Other | Admitting: "Endocrinology

## 2016-01-16 ENCOUNTER — Other Ambulatory Visit: Payer: Self-pay | Admitting: "Endocrinology

## 2016-01-18 ENCOUNTER — Encounter: Payer: Self-pay | Admitting: "Endocrinology

## 2016-01-18 ENCOUNTER — Ambulatory Visit (INDEPENDENT_AMBULATORY_CARE_PROVIDER_SITE_OTHER): Payer: Medicare Other | Admitting: "Endocrinology

## 2016-01-18 VITALS — BP 110/74 | HR 96 | Ht 68.0 in | Wt 243.0 lb

## 2016-01-18 DIAGNOSIS — I10 Essential (primary) hypertension: Secondary | ICD-10-CM

## 2016-01-18 DIAGNOSIS — E785 Hyperlipidemia, unspecified: Secondary | ICD-10-CM | POA: Diagnosis not present

## 2016-01-18 DIAGNOSIS — E1142 Type 2 diabetes mellitus with diabetic polyneuropathy: Secondary | ICD-10-CM | POA: Diagnosis not present

## 2016-01-18 DIAGNOSIS — E1165 Type 2 diabetes mellitus with hyperglycemia: Secondary | ICD-10-CM | POA: Diagnosis not present

## 2016-01-18 DIAGNOSIS — IMO0002 Reserved for concepts with insufficient information to code with codable children: Secondary | ICD-10-CM

## 2016-01-18 MED ORDER — INSULIN DETEMIR 100 UNIT/ML FLEXPEN: 50.0000 [IU] | PEN_INJECTOR | Freq: Every day | SUBCUTANEOUS | Status: DC

## 2016-01-18 NOTE — Progress Notes (Signed)
Subjective:    Patient ID: [name] [name], female    DOB: 02/18/1941, PCP Purvis Kilts, MD   Past Medical History  Diagnosis Date  . CAD in native artery   . Thrombocytopenia, unspecified ([place])   . Unspecified hereditary and idiopathic peripheral neuropathy   . Unspecified fall   . Other and unspecified hyperlipidemia   . Unspecified essential hypertension   . Obesity, unspecified   . Personal history of neurosis   . Intrinsic asthma, unspecified   . Allergic rhinitis, cause unspecified   . Cirrhosis of liver ([facility])     NASH, afp on 06/17/12 =3.7, per pt she had hep B vaccines in 1996, hep A in process.   . Colon adenomas 03/08/10    tcs by Dr. Gala Romney  . Hyperplastic polyps of stomach 03/08/10    tcs by Dr. Dudley Major  . Chronic gastritis 03/09/11    egd by Dr. Gala Romney  . History of hemorrhoids 03/08/10    tcs- internal and external  . Diverticula of colon 03/08/10    L side  . Esophagitis, erosive 03/08/10  . GERD (gastroesophageal reflux disease)   . Wears glasses   . Type II or unspecified type diabetes mellitus without mention of complication, not stated as uncontrolled   . Vertigo   . Pancreatitis   . Bilateral leg weakness   . Thrombocytopenia ([place]) 04/12/2014  . Pancytopenia, acquired ([place]) 04/12/2014  . Cirrhosis ([place])   . Abnormal EKG     hx of left anterior fasicular block on 04-09-13 ekg epic  . Chronic shoulder pain    Past Surgical History  Procedure Laterality Date  . Hysterectomy and btl      s/p  . Tumor excision  2003    rt arm and left foot  . Colonoscopy  03/08/10    Dr. [name]-->ext/int hemorrhoids, anal paipilla, rectal polyps, desc polyps, cecal polyp, left-sided diverticula. suboptiomal prep. next TCS 02/2015. multiple adenomas  . Esophagogastroduodenoscopy  03/08/10    Dr. [name] erosive RE, small hh, antral erosions, small 1cm are of mucosal indentation along gastric body of doubtful significance, cystic nodularity of hypophyarynx, base of tongue,  base of epiglottis. benign gastric biopsies  . Tubal ligation    . Esophagogastroduodenoscopy  10/08/2011    Dr. [name] esophageal varices, antral erosion. next egd 09/2013  . Foot surgery  2003    lt foot  . Eye surgery      cataracts bilateral  . Orif humerus fracture Right 02/17/2013    Procedure: OPEN REDUCTION INTERNAL FIXATION (ORIF) RIGHT PROXIMAL HUMERUS FRACTURE;  Surgeon: [name], MD;  Location: [facility];  Service: Orthopedics;  Laterality: Right;  . Partial gastrectomy      family denies  . Esophagogastroduodenoscopy N/A 02/17/2014    Dr. Gala Romney: portal gastropathy, no varices. Due for surveillance 2017  . Breast surgery Right few yrs ago    benign area removed  . Abdominal hysterectomy    . Cardiac catheterization  02/25/2007    Dr. [name]:  normal LV systolic function, mild irregularities of LAD   Social History   Social History  . Marital Status: Married    Spouse Name: N/A  . Number of Children: 3  . Years of Education: N/A   Social History Main Topics  . Smoking status: Never Smoker   . Smokeless tobacco: Never Used  . Alcohol Use: No  . Drug Use: No  . Sexual Activity: Not Currently    Birth Control/  Protection: Surgical   Other Topics Concern  . None   Social History Narrative   Outpatient Encounter Prescriptions as of 01/18/2016  Medication Sig  . ALPRAZolam (XANAX) 0.5 MG tablet Take 1 tablet (0.5 mg total) by mouth 3 (three) times daily as needed for anxiety.  . blood glucose meter kit and supplies KIT Dispense based on patient and insurance preference. Use up to four times daily as directed. (FOR ICD-10 E11.65).  . budesonide-formoterol (SYMBICORT) 160-4.5 MCG/ACT inhaler Inhale 2 puffs into the lungs at bedtime.   . enalapril (VASOTEC) 5 MG tablet Take 1 tablet by mouth daily.  Marland Kitchen ESTRACE VAGINAL 0.1 MG/GM vaginal cream Place 1 Applicatorful vaginally every other day. At  bedtime  . furosemide (LASIX) 20 MG tablet Take 1 tablet (20 mg total) by  mouth every morning.  . insulin aspart (NOVOLOG FLEXPEN) 100 UNIT/ML FlexPen Inject 10-16 Units into the skin 3 (three) times daily with meals.  . Insulin Detemir (LEVEMIR FLEXTOUCH) 100 UNIT/ML Pen Inject 50 Units into the skin daily at 10 pm.  . Insulin Pen Needle (B-D ULTRAFINE III SHORT PEN) 31G X 8 MM MISC 1 each by Does not apply route as directed.  . lactulose (CHRONULAC) 10 GM/15ML solution Take 60 mLs by mouth 2 (two) times daily.  . meclizine (ANTIVERT) 25 MG tablet Take 25 mg by mouth 3 (three) times daily as needed for dizziness or nausea.   . Multiple Vitamins-Minerals (CENTRUM SILVER PO) Take 1 tablet by mouth daily.   Marland Kitchen NEXIUM 40 MG capsule Take 40 mg by mouth every morning.   . ondansetron (ZOFRAN ODT) 4 MG disintegrating tablet Take 1 tablet (4 mg total) by mouth every 8 (eight) hours as needed for nausea or vomiting.  Glory Rosebush DELICA LANCETS 01U MISC USE UP TO FOUR TIMES DAILY AS DIRECTED  . ONETOUCH VERIO test strip USE UP TO FOUR TIMES DAILY AS DIRECTED  . OxyCODONE (OXYCONTIN) 10 mg T12A 12 hr tablet Take 1 tablet (10 mg total) by mouth 2 (two) times daily as needed (severe pain).  . pregabalin (LYRICA) 100 MG capsule Take 100 mg by mouth 2 (two) times daily.  . rifaximin (XIFAXAN) 550 MG TABS tablet Take 1 tablet (550 mg total) by mouth 2 (two) times daily.  Marland Kitchen triamcinolone cream (KENALOG) 0.1 % Apply 1 application topically daily as needed (for irritation).   . ursodiol (URSO FORTE) 500 MG tablet Take 500 mg by mouth 2 (two) times daily.  . [DISCONTINUED] aspirin EC 325 MG EC tablet Take 1 tablet (325 mg total) by mouth every morning.  . [DISCONTINUED] doxycycline (VIBRAMYCIN) 100 MG capsule Take 1 capsule (100 mg total) by mouth 2 (two) times daily.  . [DISCONTINUED] HUMALOG KWIKPEN 100 UNIT/ML KiwkPen INJECT 10-16 UNITS INTO THE SKIN THREE TIMES DAILY  . [DISCONTINUED] LEVEMIR FLEXTOUCH 100 UNIT/ML Pen INJECT 60 UNITS INTO THE SKIN EVERY NIGHT AT BEDTIME  .  [DISCONTINUED] ondansetron (ZOFRAN) 4 MG tablet Take 1 tablet (4 mg total) by mouth every 6 (six) hours as needed for nausea.  . [DISCONTINUED] spironolactone (ALDACTONE) 50 MG tablet Take 1 tablet (50 mg total) by mouth every morning.  . [DISCONTINUED] sulfamethoxazole-trimethoprim (BACTRIM DS,SEPTRA DS) 800-160 MG tablet Take 1 tablet by mouth 2 (two) times daily.   No facility-administered encounter medications on file as of 01/18/2016.   ALLERGIES: Allergies  Allergen Reactions  . Morphine And Related Itching  . Compazine [Prochlorperazine Edisylate] Anxiety    Causes anxiety & nervousness   VACCINATION  STATUS: Immunization History  Administered Date(s) Administered  . Influenza,inj,Quad PF,36+ Mos 07/31/2013, 08/05/2014  . Tdap 09/18/2013    Diabetes She presents for her follow-up (Patient missed several appointments, last seen here 20 months ago.) diabetic visit. She has type 2 diabetes mellitus. Onset time: she was diagnosed at approximate age of 89 years. Her disease course has been improving. There are no hypoglycemic associated symptoms. Pertinent negatives for hypoglycemia include no confusion, headaches, pallor or seizures. Pertinent negatives for diabetes include no chest pain, no polydipsia, no polyphagia and no polyuria. There are no hypoglycemic complications. Symptoms are improving. Diabetic complications include peripheral neuropathy. Risk factors for coronary artery disease include diabetes mellitus, hypertension, sedentary lifestyle, obesity and tobacco exposure. She is compliant with treatment some of the time (Her logs show that NovoLog was utilized 4 times a day including at bedtime against the order provided to her last visit.). Her weight is stable. She is following a generally unhealthy diet. She has not had a previous visit with a dietitian. She never participates in exercise. Her breakfast blood glucose range is generally 110-130 mg/dl. Her lunch blood glucose range is  generally 140-180 mg/dl. Her dinner blood glucose range is generally 140-180 mg/dl. Her overall blood glucose range is 140-180 mg/dl. An ACE inhibitor/angiotensin II receptor blocker is being taken.  Hyperlipidemia This is a chronic problem. The current episode started more than 1 year ago. Pertinent negatives include no chest pain, myalgias or shortness of breath. Risk factors for coronary artery disease include diabetes mellitus, hypertension and a sedentary lifestyle.  Hypertension This is a chronic problem. The current episode started more than 1 year ago. Pertinent negatives include no chest pain, headaches, palpitations or shortness of breath. Past treatments include ACE inhibitors.     Review of Systems  Constitutional: Negative for unexpected weight change.  HENT: Negative for trouble swallowing and voice change.   Eyes: Negative for visual disturbance.  Respiratory: Negative for cough, shortness of breath and wheezing.   Cardiovascular: Negative for chest pain, palpitations and leg swelling.  Gastrointestinal: Negative for nausea, vomiting and diarrhea.  Endocrine: Negative for cold intolerance, heat intolerance, polydipsia, polyphagia and polyuria.  Musculoskeletal: Negative for myalgias and arthralgias.  Skin: Negative for color change, pallor, rash and wound.  Neurological: Negative for seizures and headaches.  Psychiatric/Behavioral: Negative for suicidal ideas and confusion.    Objective:    BP 110/74 mmHg  Pulse 96  Ht _0  (1.727 m)  Wt 243 lb (110.224 kg)  BMI 36.96 kg/m2  SpO2 100%  Wt Readings from Last 3 Encounters:  01/18/16 243 lb (110.224 kg)  11/08/15 230 lb (104.327 kg)  10/10/15 247 lb (112.038 kg)    Physical Exam  Constitutional: She is oriented to person, place, and time. She appears well-developed.  Remains wheelchair bound.  HENT:  Head: Normocephalic and atraumatic.  Eyes: EOM are normal.  Neck: Normal range of motion. Neck supple. No tracheal  deviation present. No thyromegaly present.  Cardiovascular: Normal rate and regular rhythm.   Pulmonary/Chest: Effort normal and breath sounds normal.  Abdominal: Soft. Bowel sounds are normal. There is no tenderness. There is no guarding.  Musculoskeletal: Normal range of motion. She exhibits no edema.  Neurological: She is alert and oriented to person, place, and time. No cranial nerve deficit. Coordination normal.  Remains wheelchair bound.   Skin: Skin is warm and dry. No rash noted. No erythema. No pallor.  Psychiatric: She has a normal mood and affect. Judgment normal.  Results for orders placed or performed in visit on 40/81/44  Basic metabolic panel  Result Value Ref Range   Sodium 140 135 - 146 mmol/L   Potassium 4.6 3.5 - 5.3 mmol/L   Chloride 106 98 - 110 mmol/L   CO2 27 20 - 31 mmol/L   Glucose, Bld 139 (H) 65 - 99 mg/dL   BUN 13 7 - 25 mg/dL   Creat 0.65 0.60 - 0.93 mg/dL   Calcium 8.7 8.6 - 10.4 mg/dL  Hemoglobin A1c  Result Value Ref Range   Hgb A1c MFr Bld 7.1 (H) <5.7 %   Mean Plasma Glucose 157 (H) <117 mg/dL    Diabetic Labs (most recent): Lab Results  Component Value Date   HGBA1C 7.1* 12/26/2015   HGBA1C 7.9* 09/28/2015   HGBA1C 7.0* 12/24/2014   Lipid profile (most recent): Lab Results  Component Value Date   TRIG 53 04/28/2013   CHOL 132 04/28/2013    Assessment & Plan:   1. Uncontrolled type 2 diabetes mellitus with peripheral neuropathy ([place])  - Her  diabetes is  complicated by neuropathy and patient remains at a high risk for more acute and chronic complications of diabetes which include CAD, CVA, CKD, retinopathy, and neuropathy. These are all discussed in detail with the patient.  Patient came with no glucose readings nor meter ,  and  recent A1c 7.1% improving from 7.9 %.  Recent labs reviewed.   - I have re-counseled the patient on diet management and weight loss  by adopting a carbohydrate restricted / protein rich  Diet.  -  Suggestion is made for patient to avoid simple carbohydrates   from their diet including Cakes , Desserts, Ice Cream,  Soda (  diet and regular) , Sweet Tea , Candies,  Chips, Cookies, Artificial Sweeteners,   and "Sugar-free" Products .  This will help patient to have stable blood glucose profile and potentially avoid unintended  Weight gain.  - Patient is advised to stick to a routine mealtimes to eat 3 meals  a day and avoid unnecessary snacks ( to snack only to correct hypoglycemia).  - The patient  Will be scheduled with Jearld Fenton, RDN, CDE for individualized DM education.  - I have approached patient with the following individualized plan to manage diabetes and patient agrees.   - I will   lower Levemir to 50 units daily at bedtime, and  Novolog 10 units TIDAC plus SSI.  -I have discussed with the patient to avoid using NovoLog at bedtime. - she is asked to resume to document BG readings ac and hs.  -insulin is the only safe medication she should be getting given her hx of  Liver failure from unidentified medication. - Patient specific target  for A1c; LDL, HDL, Triglycerides, and  Waist Circumference were discussed in detail.  2) BP/HTN: uncontrolled . Continue current medications including ACEI/ARB.  3)  Weight/Diet: CDE consult will be requested, basic  carbohydrates information provided.  4) Chronic Care/Health Maintenance:  -Patient is on ACEI/ARB  medications and encouraged to continue to follow up with Ophthalmology, Podiatrist at least yearly or according to recommendations, and advised to stay away from smoking. I have recommended yearly flu vaccine and pneumonia vaccination at least every 5 years; and  sleep for at least 7 hours a day.  - 25 minutes of time was spent on the care of this patient , 50% of which was applied for counseling on diabetes complications and their preventions.  -  I advised patient to maintain close follow up with Purvis Kilts, MD for  primary care needs.  Patient is asked to bring meter and  blood glucose logs during their next visit.   Follow up plan: -Return in about 3 months (around 04/19/2016) for diabetes, high blood pressure, high cholesterol, follow up with pre-visit labs, meter, and logs.  Glade Lloyd, MD Phone: [phone]  Fax: [phone]   01/18/2016, 10:56 AM

## 2016-01-20 DIAGNOSIS — G894 Chronic pain syndrome: Secondary | ICD-10-CM | POA: Diagnosis not present

## 2016-01-20 DIAGNOSIS — Z1389 Encounter for screening for other disorder: Secondary | ICD-10-CM | POA: Diagnosis not present

## 2016-02-06 ENCOUNTER — Encounter: Payer: Self-pay | Admitting: Internal Medicine

## 2016-02-10 ENCOUNTER — Encounter (HOSPITAL_COMMUNITY): Payer: Self-pay

## 2016-02-16 DIAGNOSIS — I2510 Atherosclerotic heart disease of native coronary artery without angina pectoris: Secondary | ICD-10-CM | POA: Diagnosis not present

## 2016-02-16 DIAGNOSIS — I10 Essential (primary) hypertension: Secondary | ICD-10-CM | POA: Diagnosis not present

## 2016-02-16 DIAGNOSIS — K7460 Unspecified cirrhosis of liver: Secondary | ICD-10-CM | POA: Diagnosis not present

## 2016-02-20 DIAGNOSIS — L03032 Cellulitis of left toe: Secondary | ICD-10-CM | POA: Diagnosis not present

## 2016-02-20 DIAGNOSIS — M79675 Pain in left toe(s): Secondary | ICD-10-CM | POA: Diagnosis not present

## 2016-02-20 DIAGNOSIS — L600 Ingrowing nail: Secondary | ICD-10-CM | POA: Diagnosis not present

## 2016-02-22 ENCOUNTER — Encounter (HOSPITAL_COMMUNITY): Payer: Self-pay

## 2016-02-22 ENCOUNTER — Emergency Department (HOSPITAL_COMMUNITY): Payer: Medicare Other

## 2016-02-22 ENCOUNTER — Observation Stay (HOSPITAL_COMMUNITY)
Admission: EM | Admit: 2016-02-22 | Discharge: 2016-02-24 | Disposition: A | Discharge status: Home / Self Care | Payer: Medicare Other | Attending: Internal Medicine | Admitting: Internal Medicine

## 2016-02-22 DIAGNOSIS — R5381 Other malaise: Secondary | ICD-10-CM | POA: Diagnosis present

## 2016-02-22 DIAGNOSIS — K219 Gastro-esophageal reflux disease without esophagitis: Secondary | ICD-10-CM | POA: Diagnosis not present

## 2016-02-22 DIAGNOSIS — E7220 Disorder of urea cycle metabolism, unspecified: Secondary | ICD-10-CM | POA: Diagnosis present

## 2016-02-22 DIAGNOSIS — R296 Repeated falls: Secondary | ICD-10-CM | POA: Diagnosis not present

## 2016-02-22 DIAGNOSIS — I119 Hypertensive heart disease without heart failure: Secondary | ICD-10-CM | POA: Diagnosis not present

## 2016-02-22 DIAGNOSIS — M6281 Muscle weakness (generalized): Principal | ICD-10-CM | POA: Insufficient documentation

## 2016-02-22 DIAGNOSIS — I2510 Atherosclerotic heart disease of native coronary artery without angina pectoris: Secondary | ICD-10-CM | POA: Insufficient documentation

## 2016-02-22 DIAGNOSIS — N390 Urinary tract infection, site not specified: Secondary | ICD-10-CM | POA: Diagnosis present

## 2016-02-22 DIAGNOSIS — Z794 Long term (current) use of insulin: Secondary | ICD-10-CM | POA: Diagnosis not present

## 2016-02-22 DIAGNOSIS — R531 Weakness: Secondary | ICD-10-CM

## 2016-02-22 DIAGNOSIS — J45909 Unspecified asthma, uncomplicated: Secondary | ICD-10-CM | POA: Insufficient documentation

## 2016-02-22 DIAGNOSIS — K7682 Hepatic encephalopathy: Secondary | ICD-10-CM | POA: Diagnosis present

## 2016-02-22 DIAGNOSIS — K7581 Nonalcoholic steatohepatitis (NASH): Secondary | ICD-10-CM

## 2016-02-22 DIAGNOSIS — W19XXXD Unspecified fall, subsequent encounter: Secondary | ICD-10-CM

## 2016-02-22 DIAGNOSIS — Z7982 Long term (current) use of aspirin: Secondary | ICD-10-CM | POA: Insufficient documentation

## 2016-02-22 DIAGNOSIS — R262 Difficulty in walking, not elsewhere classified: Secondary | ICD-10-CM | POA: Diagnosis present

## 2016-02-22 DIAGNOSIS — K7290 Hepatic failure, unspecified without coma: Secondary | ICD-10-CM | POA: Diagnosis present

## 2016-02-22 DIAGNOSIS — E1140 Type 2 diabetes mellitus with diabetic neuropathy, unspecified: Secondary | ICD-10-CM | POA: Insufficient documentation

## 2016-02-22 DIAGNOSIS — E784 Other hyperlipidemia: Secondary | ICD-10-CM | POA: Insufficient documentation

## 2016-02-22 DIAGNOSIS — R5383 Other fatigue: Secondary | ICD-10-CM | POA: Diagnosis present

## 2016-02-22 DIAGNOSIS — G629 Polyneuropathy, unspecified: Secondary | ICD-10-CM

## 2016-02-22 DIAGNOSIS — R29898 Other symptoms and signs involving the musculoskeletal system: Secondary | ICD-10-CM | POA: Diagnosis present

## 2016-02-22 DIAGNOSIS — I10 Essential (primary) hypertension: Secondary | ICD-10-CM

## 2016-02-22 DIAGNOSIS — K7460 Unspecified cirrhosis of liver: Secondary | ICD-10-CM | POA: Diagnosis present

## 2016-02-22 DIAGNOSIS — W19XXXA Unspecified fall, initial encounter: Secondary | ICD-10-CM | POA: Diagnosis present

## 2016-02-22 LAB — CBC WITH DIFFERENTIAL/PLATELET
BASOS ABS: 0 10*3/uL (ref 0.0–0.1)
Basophils Relative: 1 %
EOS ABS: 0.1 10*3/uL (ref 0.0–0.7)
EOS PCT: 2 %
HEMATOCRIT: 34.9 % — AB (ref 36.0–46.0)
Hemoglobin: 11.5 g/dL — ABNORMAL LOW (ref 12.0–15.0)
Lymphocytes Relative: 24 %
Lymphs Abs: 1.1 10*3/uL (ref 0.7–4.0)
MCH: 25.9 pg — AB (ref 26.0–34.0)
MCHC: 33 g/dL (ref 30.0–36.0)
MCV: 78.6 fL (ref 78.0–100.0)
MONO ABS: 0.5 10*3/uL (ref 0.1–1.0)
Monocytes Relative: 11 %
Neutro Abs: 2.9 10*3/uL (ref 1.7–7.7)
Neutrophils Relative %: 62 %
PLATELETS: 155 10*3/uL (ref 150–400)
RBC: 4.44 MIL/uL (ref 3.87–5.11)
RDW: 14.8 % (ref 11.5–15.5)
WBC: 4.6 10*3/uL (ref 4.0–10.5)

## 2016-02-22 LAB — COMPREHENSIVE METABOLIC PANEL
ALK PHOS: 100 U/L (ref 38–126)
ALT: 12 U/L — AB (ref 14–54)
AST: 17 U/L (ref 15–41)
Albumin: 2.9 g/dL — ABNORMAL LOW (ref 3.5–5.0)
Anion gap: 8 (ref 5–15)
BILIRUBIN TOTAL: 0.8 mg/dL (ref 0.3–1.2)
BUN: 14 mg/dL (ref 6–20)
CALCIUM: 8.5 mg/dL — AB (ref 8.9–10.3)
CO2: 24 mmol/L (ref 22–32)
CREATININE: 0.79 mg/dL (ref 0.44–1.00)
Chloride: 106 mmol/L (ref 101–111)
Glucose, Bld: 231 mg/dL — ABNORMAL HIGH (ref 65–99)
Potassium: 4.3 mmol/L (ref 3.5–5.1)
Sodium: 138 mmol/L (ref 135–145)
TOTAL PROTEIN: 5.5 g/dL — AB (ref 6.5–8.1)

## 2016-02-22 LAB — URINALYSIS, ROUTINE W REFLEX MICROSCOPIC
BILIRUBIN URINE: NEGATIVE
Glucose, UA: NEGATIVE mg/dL
HGB URINE DIPSTICK: NEGATIVE
KETONES UR: NEGATIVE mg/dL
NITRITE: POSITIVE — AB
Protein, ur: NEGATIVE mg/dL
Specific Gravity, Urine: 1.025 (ref 1.005–1.030)
pH: 6 (ref 5.0–8.0)

## 2016-02-22 LAB — URINE MICROSCOPIC-ADD ON: RBC / HPF: NONE SEEN RBC/hpf (ref 0–5)

## 2016-02-22 LAB — GLUCOSE, CAPILLARY: GLUCOSE-CAPILLARY: 174 mg/dL — AB (ref 65–99)

## 2016-02-22 LAB — AMMONIA: AMMONIA: 85 umol/L — AB (ref 9–35)

## 2016-02-22 MED ORDER — ASPIRIN EC 81 MG PO TBEC
81.0000 mg | DELAYED_RELEASE_TABLET | Freq: Every day | ORAL | Status: DC
  Administered 2016-02-23 – 2016-02-24 (×2): 81 mg via ORAL
  Filled 2016-02-22 (×2): qty 1

## 2016-02-22 MED ORDER — INSULIN DETEMIR 100 UNIT/ML ~~LOC~~ SOLN
50.0000 [IU] | Freq: Every day | SUBCUTANEOUS | Status: DC
  Administered 2016-02-22 – 2016-02-23 (×2): 50 [IU] via SUBCUTANEOUS
  Filled 2016-02-22 (×5): qty 0.5

## 2016-02-22 MED ORDER — INSULIN DETEMIR 100 UNIT/ML FLEXPEN: 50.0000 [IU] | PEN_INJECTOR | Freq: Every day | SUBCUTANEOUS | Status: DC

## 2016-02-22 MED ORDER — ONDANSETRON HCL 4 MG PO TABS: 4.0000 mg | ORAL_TABLET | Freq: Four times a day (QID) | ORAL | Status: DC | PRN

## 2016-02-22 MED ORDER — ACETAMINOPHEN 325 MG PO TABS
650.0000 mg | ORAL_TABLET | Freq: Four times a day (QID) | ORAL | Status: DC | PRN
  Administered 2016-02-23: 650 mg via ORAL
  Filled 2016-02-22: qty 2

## 2016-02-22 MED ORDER — POLYETHYLENE GLYCOL 3350 17 G PO PACK: 17.0000 g | PACK | Freq: Every day | ORAL | Status: DC | PRN

## 2016-02-22 MED ORDER — ENALAPRIL MALEATE 5 MG PO TABS
5.0000 mg | ORAL_TABLET | Freq: Every day | ORAL | Status: DC
  Administered 2016-02-23 – 2016-02-24 (×2): 5 mg via ORAL
  Filled 2016-02-22 (×2): qty 1

## 2016-02-22 MED ORDER — ADULT MULTIVITAMIN W/MINERALS CH
1.0000 | ORAL_TABLET | Freq: Every day | ORAL | Status: DC
  Administered 2016-02-23 – 2016-02-24 (×2): 1 via ORAL
  Filled 2016-02-22 (×2): qty 1

## 2016-02-22 MED ORDER — PANTOPRAZOLE SODIUM 40 MG PO TBEC
40.0000 mg | DELAYED_RELEASE_TABLET | Freq: Every day | ORAL | Status: DC
  Administered 2016-02-23 – 2016-02-24 (×2): 40 mg via ORAL
  Filled 2016-02-22 (×2): qty 1

## 2016-02-22 MED ORDER — ONDANSETRON HCL 4 MG/2ML IJ SOLN: 4.0000 mg | Freq: Four times a day (QID) | INTRAMUSCULAR | Status: DC | PRN

## 2016-02-22 MED ORDER — ENOXAPARIN SODIUM 40 MG/0.4ML ~~LOC~~ SOLN
40.0000 mg | SUBCUTANEOUS | Status: DC
  Administered 2016-02-22 – 2016-02-23 (×2): 40 mg via SUBCUTANEOUS
  Filled 2016-02-22 (×2): qty 0.4

## 2016-02-22 MED ORDER — PREGABALIN 50 MG PO CAPS
100.0000 mg | ORAL_CAPSULE | Freq: Two times a day (BID) | ORAL | Status: DC
  Administered 2016-02-22 – 2016-02-24 (×4): 100 mg via ORAL
  Filled 2016-02-22 (×4): qty 2

## 2016-02-22 MED ORDER — URSODIOL 500 MG PO TABS: 500.0000 mg | ORAL_TABLET | Freq: Two times a day (BID) | ORAL | Status: DC

## 2016-02-22 MED ORDER — SODIUM CHLORIDE 0.45 % IV SOLN
INTRAVENOUS | Status: DC
  Administered 2016-02-22 – 2016-02-24 (×4): via INTRAVENOUS

## 2016-02-22 MED ORDER — DEXTROSE 5 % IV SOLN
1.0000 g | INTRAVENOUS | Status: DC
  Administered 2016-02-23: 1 g via INTRAVENOUS
  Filled 2016-02-22 (×4): qty 10

## 2016-02-22 MED ORDER — MOMETASONE FURO-FORMOTEROL FUM 200-5 MCG/ACT IN AERO
2.0000 | INHALATION_SPRAY | Freq: Two times a day (BID) | RESPIRATORY_TRACT | Status: DC
  Administered 2016-02-23 – 2016-02-24 (×2): 2 via RESPIRATORY_TRACT
  Filled 2016-02-22: qty 8.8

## 2016-02-22 MED ORDER — DEXTROSE 5 % IV SOLN
1.0000 g | Freq: Once | INTRAVENOUS | Status: AC
  Administered 2016-02-22: 1 g via INTRAVENOUS
  Filled 2016-02-22: qty 10

## 2016-02-22 MED ORDER — ACETAMINOPHEN 650 MG RE SUPP: 650.0000 mg | Freq: Four times a day (QID) | RECTAL | Status: DC | PRN

## 2016-02-22 MED ORDER — LACTULOSE 10 GM/15ML PO SOLN
10.0000 g | Freq: Four times a day (QID) | ORAL | Status: DC
  Administered 2016-02-22 – 2016-02-24 (×7): 10 g via ORAL
  Filled 2016-02-22 (×6): qty 30

## 2016-02-22 MED ORDER — INSULIN ASPART 100 UNIT/ML ~~LOC~~ SOLN
0.0000 [IU] | Freq: Every day | SUBCUTANEOUS | Status: DC
  Administered 2016-02-23: 2 [IU] via SUBCUTANEOUS

## 2016-02-22 MED ORDER — URSODIOL 300 MG PO CAPS
600.0000 mg | ORAL_CAPSULE | Freq: Two times a day (BID) | ORAL | Status: DC
  Administered 2016-02-22 – 2016-02-24 (×4): 600 mg via ORAL
  Filled 2016-02-22 (×10): qty 2

## 2016-02-22 MED ORDER — RIFAXIMIN 550 MG PO TABS
550.0000 mg | ORAL_TABLET | Freq: Two times a day (BID) | ORAL | Status: DC
  Administered 2016-02-22 – 2016-02-24 (×4): 550 mg via ORAL
  Filled 2016-02-22 (×4): qty 1

## 2016-02-22 MED ORDER — CENTRUM SILVER PO TABS: ORAL_TABLET | Freq: Every day | ORAL | Status: DC

## 2016-02-22 MED ORDER — INSULIN ASPART 100 UNIT/ML ~~LOC~~ SOLN
0.0000 [IU] | Freq: Three times a day (TID) | SUBCUTANEOUS | Status: DC
  Administered 2016-02-23: 8 [IU] via SUBCUTANEOUS
  Administered 2016-02-23: 3 [IU] via SUBCUTANEOUS
  Administered 2016-02-23 – 2016-02-24 (×2): 5 [IU] via SUBCUTANEOUS

## 2016-02-22 NOTE — ED Provider Notes (Signed)
CSN: [id number]     Arrival date & time 02/22/16  1156 History  By signing my name below, I, Terressa Koyanagi, attest that this documentation has been prepared under the direction and in the presence of No att. providers found. Electronically Signed: Terressa Koyanagi, ED Scribe. 02/26/2016. 12:29 PM.  Chief Complaint  Patient presents with  . fatigue   . frequent falls    The history is provided by the patient. No language interpreter was used.   PCP: Purvis Kilts, MD HPI Comments: [name] [name] is a 75 y.o. female, who is wheelchair bound and with PMHx noted below including recurrent UTIs, who presents to the Emergency Department complaining of frequent falls and increased fatigue onset three weeks ago. Pt also complains of foul, strong odor from urine also onset approximately three weeks ago. Pt reports she was seen for the same by her PCP who Dx her with an UTI and started her on antibiotics-- pt reports compliance with said meds without improvement. Lastly, pt complains of increased weakness to left leg onset three weeks ago. Pt denies any recent changes to her daily meds. Pt further denies any weakness to left arm or any blood thinner use. Pt further denies any Hx of stroke, however, pt reports she was "tested for one" one year ago.   Past Medical History  Diagnosis Date  . CAD in native artery   . Thrombocytopenia, unspecified ([place])   . Unspecified hereditary and idiopathic peripheral neuropathy   . Unspecified fall   . Other and unspecified hyperlipidemia   . Unspecified essential hypertension   . Obesity, unspecified   . Personal history of neurosis   . Intrinsic asthma, unspecified   . Allergic rhinitis, cause unspecified   . Cirrhosis of liver (HCC)     NASH, afp on 06/17/12 =3.7, per pt she had hep B vaccines in 1996, hep A in process.   . Colon adenomas 03/08/10    tcs by Dr. Gala Romney  . Hyperplastic polyps of stomach 03/08/10    tcs by Dr. Dudley Major  . Chronic gastritis 03/09/11     egd by Dr. Gala Romney  . History of hemorrhoids 03/08/10    tcs- internal and external  . Diverticula of colon 03/08/10    L side  . Esophagitis, erosive 03/08/10  . GERD (gastroesophageal reflux disease)   . Wears glasses   . Type II or unspecified type diabetes mellitus without mention of complication, not stated as uncontrolled   . Vertigo   . Pancreatitis   . Bilateral leg weakness   . Thrombocytopenia ([place]) 04/12/2014  . Pancytopenia, acquired ([place]) 04/12/2014  . Cirrhosis ([place])   . Abnormal EKG     hx of left anterior fasicular block on 04-09-13 ekg epic  . Chronic shoulder pain    Past Surgical History  Procedure Laterality Date  . Hysterectomy and btl      s/p  . Tumor excision  2003    rt arm and left foot  . Colonoscopy  03/08/10    Dr. [name]-->ext/int hemorrhoids, anal paipilla, rectal polyps, desc polyps, cecal polyp, left-sided diverticula. suboptiomal prep. next TCS 02/2015. multiple adenomas  . Esophagogastroduodenoscopy  03/08/10    Dr. [name] erosive RE, small hh, antral erosions, small 1cm are of mucosal indentation along gastric body of doubtful significance, cystic nodularity of hypophyarynx, base of tongue, base of epiglottis. benign gastric biopsies  . Tubal ligation    . Esophagogastroduodenoscopy  10/08/2011    Dr. [name] esophageal varices,  antral erosion. next egd 09/2013  . Foot surgery  2003    lt foot  . Eye surgery      cataracts bilateral  . Orif humerus fracture Right 02/17/2013    Procedure: OPEN REDUCTION INTERNAL FIXATION (ORIF) RIGHT PROXIMAL HUMERUS FRACTURE;  Surgeon: [name], MD;  Location: [facility];  Service: Orthopedics;  Laterality: Right;  . Partial gastrectomy      family denies  . Esophagogastroduodenoscopy N/A 02/17/2014    Dr. Gala Romney: portal gastropathy, no varices. Due for surveillance 2017  . Breast surgery Right few yrs ago    benign area removed  . Abdominal hysterectomy    . Cardiac catheterization  02/25/2007    Dr. [name]:   normal LV systolic function, mild irregularities of LAD   Family History  Problem Relation Age of Onset  . Coronary artery disease      FH  . Diabetes      FH  . Heart disease Mother   . Colon cancer Neg Hx    Social History  Substance Use Topics  . Smoking status: Never Smoker   . Smokeless tobacco: Never Used  . Alcohol Use: No   OB History    No data available     Review of Systems  Constitutional: Positive for activity change, appetite change and fatigue. Negative for fever and chills.  Respiratory: Negative for cough and shortness of breath.   Cardiovascular: Negative for chest pain.  Gastrointestinal: Negative for nausea, vomiting, abdominal pain and diarrhea.  Genitourinary: Negative for dysuria, frequency, hematuria, flank pain and difficulty urinating.  Musculoskeletal: Negative for back pain, neck pain and neck stiffness.  Skin: Negative for rash and wound.  Neurological: Positive for weakness (left leg ). Negative for syncope, numbness and headaches.  Hematological: Does not bruise/bleed easily.  Psychiatric/Behavioral: Negative for confusion.  All other systems reviewed and are negative.  Allergies  Morphine and related and Compazine  Home Medications   Prior to Admission medications   Medication Sig Start Date End Date Taking? Authorizing Provider  ALPRAZolam Duanne Moron) 0.5 MG tablet Take 1 tablet (0.5 mg total) by mouth 3 (three) times daily as needed for anxiety. Patient taking differently: Take 0.5 mg by mouth 2 (two) times daily.  12/24/14  Yes [name], MD  aspirin EC 81 MG tablet Take 81 mg by mouth daily.   Yes Historical Provider, MD  budesonide-formoterol (SYMBICORT) 160-4.5 MCG/ACT inhaler Inhale 2 puffs into the lungs at bedtime.    Yes Historical Provider, MD  enalapril (VASOTEC) 5 MG tablet Take 1 tablet by mouth daily. 08/25/14  Yes Historical Provider, MD  ESTRACE VAGINAL 0.1 MG/GM vaginal cream Place 1 Applicatorful vaginally every other  day. At  bedtime 08/11/13  Yes Historical Provider, MD  furosemide (LASIX) 20 MG tablet Take 1 tablet (20 mg total) by mouth every morning. Patient taking differently: Take 20 mg by mouth daily as needed for fluid.  09/03/14  Yes Mahala Menghini, PA-C  insulin aspart (NOVOLOG FLEXPEN) 100 UNIT/ML FlexPen Inject 10-16 Units into the skin 3 (three) times daily with meals. Patient taking differently: Inject 0-15 Units into the skin 3 (three) times daily with meals.  10/10/15  Yes Cassandria Anger, MD  Insulin Detemir (LEVEMIR FLEXTOUCH) 100 UNIT/ML Pen Inject 50 Units into the skin daily at 10 pm. 01/18/16  Yes Cassandria Anger, MD  lactulose (CHRONULAC) 10 GM/15ML solution Take 10 g by mouth 4 (four) times daily.  10/20/14  Yes Historical Provider,  MD  Multiple Vitamins-Minerals (CENTRUM SILVER PO) Take 1 tablet by mouth daily.    Yes Historical Provider, MD  NEXIUM 40 MG capsule Take 40 mg by mouth every morning.  06/17/14  Yes Historical Provider, MD  pregabalin (LYRICA) 100 MG capsule Take 100 mg by mouth 2 (two) times daily.   Yes Historical Provider, MD  rifaximin (XIFAXAN) 550 MG TABS tablet Take 1 tablet (550 mg total) by mouth 2 (two) times daily. 06/28/14  Yes Kathie Dike, MD  spironolactone (ALDACTONE) 50 MG tablet Take 50 mg by mouth daily.   Yes Historical Provider, MD  triamcinolone cream (KENALOG) 0.1 % Apply 1 application topically daily as needed (for irritation).  06/26/13  Yes Historical Provider, MD  ursodiol (URSO FORTE) 500 MG tablet Take 500 mg by mouth 2 (two) times daily.   Yes Historical Provider, MD  blood glucose meter kit and supplies KIT Dispense based on patient and insurance preference. Use up to four times daily as directed. (FOR ICD-10 E11.65). 10/17/15   Cassandria Anger, MD  Insulin Pen Needle (B-D ULTRAFINE III SHORT PEN) 31G X 8 MM MISC 1 each by Does not apply route as directed. 10/10/15   Cassandria Anger, MD  ONETOUCH DELICA LANCETS 61W MISC USE UP TO  FOUR TIMES DAILY AS DIRECTED 11/15/15   Cassandria Anger, MD  ONETOUCH VERIO test strip USE UP TO FOUR TIMES DAILY AS DIRECTED 11/15/15   Cassandria Anger, MD   Triage Vitals: BP 112/57 mmHg  Pulse 79  Temp(Src) 98.3 F (36.8 C) (Temporal)  Resp 20  Ht '5\' 8"'  (1.727 m)  Wt 240 lb (108.863 kg)  BMI 36.50 kg/m2  SpO2 98% Physical Exam  Constitutional: She is oriented to person, place, and time. She appears well-developed and well-nourished. No distress.  HENT:  Head: Normocephalic and atraumatic.  Mouth/Throat: Oropharynx is clear and moist. No oropharyngeal exudate.  Eyes: EOM are normal. Pupils are equal, round, and reactive to light.  Neck: Normal range of motion. Neck supple.  No meningismus. No posterior midline tenderness.  Cardiovascular: Normal rate and regular rhythm.  Exam reveals no gallop and no friction rub.   No murmur heard. Pulmonary/Chest: Effort normal and breath sounds normal. No respiratory distress. She has no wheezes. She has no rales. She exhibits no tenderness.  Abdominal: Soft. Bowel sounds are normal. She exhibits no distension and no mass. There is no tenderness. There is no rebound and no guarding.  Musculoskeletal: Normal range of motion. She exhibits no edema or tenderness.  Decreased range of motion of the right shoulder due to previous fracture. Per patient and family this is the patient's baseline. 2+ distal pulses in all extremities. No lower extremity asymmetry, calf tenderness.  Neurological: She is alert and oriented to person, place, and time.  4/5 motor in the left lower extremity compared to 5/5 motor in the right lower extremity. Bilateral grip strengths are equal. Cranial nerves II through XII grossly intact. Sensation fully intact. Patient is unable to ambulate and having difficulty sitting up in the stretcher without assistance.  Skin: Skin is warm and dry. No rash noted. No erythema.  Psychiatric: She has a normal mood and affect. Her  behavior is normal.  Nursing note and vitals reviewed.   ED Course  Procedures (including critical care time) DIAGNOSTIC STUDIES: Oxygen Saturation is 98% on ra, nl by my interpretation.    COORDINATION OF CARE: 12:22 PM: Discussed treatment plan which includes imaging and labs with pt  at bedside; patient verbalizes understanding and agrees with treatment plan.  Labs Review Labs Reviewed  CBC WITH DIFFERENTIAL/PLATELET - Abnormal; Notable for the following:    Hemoglobin 11.5 (*)    HCT 34.9 (*)    MCH 25.9 (*)    All other components within normal limits  COMPREHENSIVE METABOLIC PANEL - Abnormal; Notable for the following:    Glucose, Bld 231 (*)    Calcium 8.5 (*)    Total Protein 5.5 (*)    Albumin 2.9 (*)    ALT 12 (*)    All other components within normal limits  URINALYSIS, ROUTINE W REFLEX MICROSCOPIC (NOT AT [facility]) - Abnormal; Notable for the following:    Nitrite POSITIVE (*)    Leukocytes, UA MODERATE (*)    All other components within normal limits  AMMONIA - Abnormal; Notable for the following:    Ammonia 85 (*)    All other components within normal limits  URINE MICROSCOPIC-ADD ON - Abnormal; Notable for the following:    Squamous Epithelial / LPF 6-30 (*)    Bacteria, UA MANY (*)    All other components within normal limits  PROTIME-INR - Abnormal; Notable for the following:    Prothrombin Time 15.4 (*)    All other components within normal limits  CBC - Abnormal; Notable for the following:    WBC 3.0 (*)    Hemoglobin 10.7 (*)    HCT 32.5 (*)    MCH 25.9 (*)    Platelets 121 (*)    All other components within normal limits  BASIC METABOLIC PANEL - Abnormal; Notable for the following:    Glucose, Bld 238 (*)    Calcium 8.0 (*)    All other components within normal limits  GLUCOSE, CAPILLARY - Abnormal; Notable for the following:    Glucose-Capillary 174 (*)    All other components within normal limits  GLUCOSE, CAPILLARY - Abnormal; Notable for the  following:    Glucose-Capillary 190 (*)    All other components within normal limits  GLUCOSE, CAPILLARY - Abnormal; Notable for the following:    Glucose-Capillary 259 (*)    All other components within normal limits  GLUCOSE, CAPILLARY - Abnormal; Notable for the following:    Glucose-Capillary 217 (*)    All other components within normal limits  AMMONIA - Abnormal; Notable for the following:    Ammonia 44 (*)    All other components within normal limits  BASIC METABOLIC PANEL - Abnormal; Notable for the following:    Glucose, Bld 135 (*)    Calcium 8.3 (*)    All other components within normal limits  GLUCOSE, CAPILLARY - Abnormal; Notable for the following:    Glucose-Capillary 242 (*)    All other components within normal limits  GLUCOSE, CAPILLARY - Abnormal; Notable for the following:    Glucose-Capillary 111 (*)    All other components within normal limits  GLUCOSE, CAPILLARY - Abnormal; Notable for the following:    Glucose-Capillary 204 (*)    All other components within normal limits  MRSA PCR SCREENING    Imaging Review No results found. I have personally reviewed and evaluated these images and lab results as part of my medical decision-making.   EKG Interpretation   Date/Time:  Wednesday February 22 2016 12:10:29 EDT Ventricular Rate:  78 PR Interval:  191 QRS Duration: 109 QT Interval:  401 QTC Calculation: 457 R Axis:   -85 Text Interpretation:  Sinus rhythm Left anterior fascicular block  Abnormal  R-wave progression, late transition Nonspecific T abnrm, anterolateral  leads ST elevation, consider inferior injury ED PHYSICIAN INTERPRETATION  AVAILABLE IN [facility] Confirmed by TEST, Record ([id number]) on  02/23/2016 7:48:04 AM      MDM   Final diagnoses:  Generalized weakness  UTI (lower urinary tract infection)  Hyperammonemia (HCC)   I personally performed the services described in this documentation, which was scribed in my presence. The  recorded information has been reviewed and is accurate.  Noted to have UTI and elevation and ammonia level. Initiating IV Rocephin in the emergency department. Discussed with [facility] hospitalist and will admit.   Julianne Rice, MD 02/26/16 0830

## 2016-02-22 NOTE — H&P (Signed)
[facility] History and Physical  SHONTELL PROSSER KGY:[id number] DOB: September 26, 1941 DOA: 02/22/2016  Referring physician: Dr. Lita Mains PCP: Purvis Kilts, MD   Chief Complaint: Falling, gen weakness, confusion  HPI: [name] [name] is a 75 y.o. female with hx of NASH/ cirrhosis and DM w neuropathy, gallstones and HTN presenting to ED with worsening confusion and gen'd weakness.  Falling more, unable to get OOB , needs more help to get to [facility].  Came here also as thinks she has UTI due to "odor" no dysuria.  In ED UA + for [facility] tntc and many bact.  NH3 up in 80's, last was 60's last year.  Asked to see for hosp admission.    Patient dx'd cirrhosis 2010.  In 2012 was still driving. In 2014 much worse, fell and broke R arm, they operated on it but fell again and rebroke it and they "wouldn't go back in".  2015 "in and out" of hospital per husband with confusion/ falls / [facility] / weakness.  Was seen at [facility] and not candidate for transplant and has gallstones but "not a candidate for surgery" because of her liver disease.    Chronic LE edema, chron LE pain d/t neuropathy on Lyrica.  No high fever, chills, CP, SOB, cough.  No abd pain or n/v/d.  No joitn pain or rash.      Chart review: Feb 2016 - falling,cirrhosis s/t NASH, gen weaknees, low plt, pancytopenia, MO, decondition, DM2, neuropathy, GERD > usually WC bound but standing w help.  Now too weak to sit on toilet, poor po intake.  ^NH3 62, UA slightly abnl. Admit, rx IV roecphin, PT, SNFP at dc  ROS  denies CP  no joint pain   no HA  no blurry vision  no rash  no diarrhea  no nausea/ vomiting  no dysuria  no difficulty voiding  no change in urine color    Where does patient live home w husband Can patient participate in ADLs? Not really   Past Medical History  Past Medical History  Diagnosis Date  . CAD in native artery   . Thrombocytopenia, unspecified ([place])   . Unspecified hereditary and idiopathic peripheral neuropathy    . Unspecified fall   . Other and unspecified hyperlipidemia   . Unspecified essential hypertension   . Obesity, unspecified   . Personal history of neurosis   . Intrinsic asthma, unspecified   . Allergic rhinitis, cause unspecified   . Cirrhosis of liver (HCC)     NASH, afp on 06/17/12 =3.7, per pt she had hep B vaccines in 1996, hep A in process.   . Colon adenomas 03/08/10    tcs by Dr. Gala Romney  . Hyperplastic polyps of stomach 03/08/10    tcs by Dr. Dudley Major  . Chronic gastritis 03/09/11    egd by Dr. Gala Romney  . History of hemorrhoids 03/08/10    tcs- internal and external  . Diverticula of colon 03/08/10    L side  . Esophagitis, erosive 03/08/10  . GERD (gastroesophageal reflux disease)   . Wears glasses   . Type II or unspecified type diabetes mellitus without mention of complication, not stated as uncontrolled   . Vertigo   . Pancreatitis   . Bilateral leg weakness   . Thrombocytopenia ([place]) 04/12/2014  . Pancytopenia, acquired ([place]) 04/12/2014  . Cirrhosis ([place])   . Abnormal EKG     hx of left anterior fasicular block on 04-09-13 ekg epic  . Chronic shoulder  pain    Past Surgical History  Past Surgical History  Procedure Laterality Date  . Hysterectomy and btl      s/p  . Tumor excision  2003    rt arm and left foot  . Colonoscopy  03/08/10    Dr. [name]-->ext/int hemorrhoids, anal paipilla, rectal polyps, desc polyps, cecal polyp, left-sided diverticula. suboptiomal prep. next TCS 02/2015. multiple adenomas  . Esophagogastroduodenoscopy  03/08/10    Dr. [name] erosive RE, small hh, antral erosions, small 1cm are of mucosal indentation along gastric body of doubtful significance, cystic nodularity of hypophyarynx, base of tongue, base of epiglottis. benign gastric biopsies  . Tubal ligation    . Esophagogastroduodenoscopy  10/08/2011    Dr. [name] esophageal varices, antral erosion. next egd 09/2013  . Foot surgery  2003    lt foot  . Eye surgery      cataracts bilateral   . Orif humerus fracture Right 02/17/2013    Procedure: OPEN REDUCTION INTERNAL FIXATION (ORIF) RIGHT PROXIMAL HUMERUS FRACTURE;  Surgeon: [name], MD;  Location: [facility];  Service: Orthopedics;  Laterality: Right;  . Partial gastrectomy      family denies  . Esophagogastroduodenoscopy N/A 02/17/2014    Dr. Gala Romney: portal gastropathy, no varices. Due for surveillance 2017  . Breast surgery Right few yrs ago    benign area removed  . Abdominal hysterectomy    . Cardiac catheterization  02/25/2007    Dr. [name]:  normal LV systolic function, mild irregularities of LAD   Family History  Family History  Problem Relation Age of Onset  . Coronary artery disease      FH  . Diabetes      FH  . Heart disease Mother   . Colon cancer Neg Hx    Social History  reports that she has never smoked. She has never used smokeless tobacco. She reports that she does not drink alcohol or use illicit drugs. Allergies  Allergies  Allergen Reactions  . Morphine And Related Itching  . Compazine [Prochlorperazine Edisylate] Anxiety    Causes anxiety & nervousness   Home medications Prior to Admission medications   Medication Sig Start Date End Date Taking? Authorizing Provider  ALPRAZolam Duanne Moron) 0.5 MG tablet Take 1 tablet (0.5 mg total) by mouth 3 (three) times daily as needed for anxiety. Patient taking differently: Take 0.5 mg by mouth 2 (two) times daily.  12/24/14  Yes [name], MD  aspirin EC 81 MG tablet Take 81 mg by mouth daily.   Yes Historical Provider, MD  budesonide-formoterol (SYMBICORT) 160-4.5 MCG/ACT inhaler Inhale 2 puffs into the lungs at bedtime.    Yes Historical Provider, MD  enalapril (VASOTEC) 5 MG tablet Take 1 tablet by mouth daily. 08/25/14  Yes Historical Provider, MD  ESTRACE VAGINAL 0.1 MG/GM vaginal cream Place 1 Applicatorful vaginally every other day. At  bedtime 08/11/13  Yes Historical Provider, MD  furosemide (LASIX) 20 MG tablet Take 1 tablet (20 mg total) by  mouth every morning. Patient taking differently: Take 20 mg by mouth daily as needed for fluid.  09/03/14  Yes Mahala Menghini, PA-C  insulin aspart (NOVOLOG FLEXPEN) 100 UNIT/ML FlexPen Inject 10-16 Units into the skin 3 (three) times daily with meals. Patient taking differently: Inject 0-15 Units into the skin 3 (three) times daily with meals.  10/10/15  Yes Cassandria Anger, MD  Insulin Detemir (LEVEMIR FLEXTOUCH) 100 UNIT/ML Pen Inject 50 Units into the skin daily at 10 pm.  01/18/16  Yes Cassandria Anger, MD  lactulose (CHRONULAC) 10 GM/15ML solution Take 10 g by mouth 4 (four) times daily.  10/20/14  Yes Historical Provider, MD  meclizine (ANTIVERT) 25 MG tablet Take 25 mg by mouth 3 (three) times daily as needed for dizziness or nausea.    Yes Historical Provider, MD  Multiple Vitamins-Minerals (CENTRUM SILVER PO) Take 1 tablet by mouth daily.    Yes Historical Provider, MD  NEXIUM 40 MG capsule Take 40 mg by mouth every morning.  06/17/14  Yes Historical Provider, MD  oxyCODONE (OXY IR/ROXICODONE) 5 MG immediate release tablet Take 5 mg by mouth 2 (two) times daily.   Yes Historical Provider, MD  pregabalin (LYRICA) 100 MG capsule Take 100 mg by mouth 2 (two) times daily.   Yes Historical Provider, MD  rifaximin (XIFAXAN) 550 MG TABS tablet Take 1 tablet (550 mg total) by mouth 2 (two) times daily. 06/28/14  Yes Kathie Dike, MD  spironolactone (ALDACTONE) 50 MG tablet Take 50 mg by mouth daily.   Yes Historical Provider, MD  triamcinolone cream (KENALOG) 0.1 % Apply 1 application topically daily as needed (for irritation).  06/26/13  Yes Historical Provider, MD  ursodiol (URSO FORTE) 500 MG tablet Take 500 mg by mouth 2 (two) times daily.   Yes Historical Provider, MD  blood glucose meter kit and supplies KIT Dispense based on patient and insurance preference. Use up to four times daily as directed. (FOR ICD-10 E11.65). 10/17/15   Cassandria Anger, MD  Insulin Pen Needle (B-D ULTRAFINE  III SHORT PEN) 31G X 8 MM MISC 1 each by Does not apply route as directed. 10/10/15   Cassandria Anger, MD  ONETOUCH DELICA LANCETS 64B MISC USE UP TO FOUR TIMES DAILY AS DIRECTED 11/15/15   Cassandria Anger, MD  [facility] VERIO test strip USE UP TO FOUR TIMES DAILY AS DIRECTED 11/15/15   Cassandria Anger, MD   Liver Function Tests  Recent Labs Lab 02/22/16 1235  AST 17  ALT 12*  ALKPHOS 100  BILITOT 0.8  PROT 5.5*  ALBUMIN 2.9*   No results for input(s): LIPASE, AMYLASE in the last 168 hours. CBC  Recent Labs Lab 02/22/16 1235  WBC 4.6  NEUTROABS 2.9  HGB 11.5*  HCT 34.9*  MCV 78.6  PLT 583   Basic Metabolic Panel  Recent Labs Lab 02/22/16 1235  NA 138  K 4.3  CL 106  CO2 24  GLUCOSE 231*  BUN 14  CREATININE 0.79  CALCIUM 8.5*     Filed Vitals:   02/22/16 1530 02/22/16 1545 02/22/16 1600 02/22/16 1615  BP: 114/62  111/65   Pulse: 68 68  67  Temp:      TempSrc:      Resp: _0 Height:      Weight:      SpO2: 96% 94%  96%   Exam: VS: BP 115/62  HR 68 bpm  RR 15   Temp 96% RA   Gen obese WF , lying flat, no distress, calm, slightly slurred speech, O x 3 No rash, cyanosis or gangrene Sclera anicteric, throat clear slightly dry  No jvd or bruits Chest clear bilat throughout RRR no MRG Abd soft ntnd no mass or ascites +bs marked obesity GU deferred MS no joint effusions or deformity Ext 1+ bilat LE edema / no wounds or ulcers Neuro is alert, R arm weak (chron issue), bilat LE weaknes 3-4/ 5, Ox 3  UA - many bact, tntc wbc, +yeast, neg prot, no rbc, +epis EKG (independently reviewed) > NSR, lafb, abnl RWP late transition, NSST-T changes ant lat leads  Home medications > Vasotec, lasix 20/d, aldactone 50/d Rifaxamin, lactulose 30 cc qid Xanax, antivert prn, oxycodone prn, Lyrica Levemir 50 qhs, SSI novolog PPI, ursodiol, estrace vag cream, symbicort, asa   Na 148 K 4.3 BUN 14 Creat 0.79   Glu 231  Alb 2.9  tbili 0.8   Last INR  1.15 Dec 2014 WBC 4k Hb 11.5 plt 155  Hb A1C 7.1 CT head > no change from 2015  Assessment: 1. Falls/ gen'd weakness - combination of decomp cirrhosis/ UTI. Background of deconditioning, severe obesity and diab neuropathy.  Probable mild intravasc vol depletion as not eating well and taking usual diuretics.  Plan admit, IV rocephin for UTI, gentle IVF and hold diuretics for now.  Med -surg.  Full code pt request.  PT eval, see if may need SNFP again.   2. Cirrhosis/ ^NH3 - not very confused but very weak per husband. Cont Xifaxin / lactulose 3. DM2 - cont levemir +SSI 4. HTN - cont acei 5. Gallstones - not operative candidate 6. UTI - as above 7. Hepatic encephalopathy - not severe 8. Morbid obesity 9. Gait failure 10. Deconditioning 11. Vol depletion - intravasc suspected  Plan - as above   DVT Prophylaxis lovenox  Code Status: full  Family Communication: husband here  Disposition Plan: as above    [place] D [facility] Pager [phone]  Cell [phone]  If 7PM-7AM, please contact night-coverage [email or web address] Password [facility] 02/22/2016, 5:40 PM

## 2016-02-22 NOTE — ED Notes (Signed)
Attempted report 

## 2016-02-22 NOTE — ED Notes (Signed)
Pt reports frequent falls and wanting to sleep a lot for the past 3 weeks.  Also reports urine is very strong.  Pt says feels like her ammonia levels are high.

## 2016-02-23 DIAGNOSIS — R29898 Other symptoms and signs involving the musculoskeletal system: Secondary | ICD-10-CM | POA: Diagnosis not present

## 2016-02-23 DIAGNOSIS — R531 Weakness: Secondary | ICD-10-CM

## 2016-02-23 DIAGNOSIS — R262 Difficulty in walking, not elsewhere classified: Secondary | ICD-10-CM | POA: Diagnosis not present

## 2016-02-23 DIAGNOSIS — K7290 Hepatic failure, unspecified without coma: Secondary | ICD-10-CM | POA: Diagnosis not present

## 2016-02-23 DIAGNOSIS — M6281 Muscle weakness (generalized): Secondary | ICD-10-CM | POA: Diagnosis not present

## 2016-02-23 LAB — BASIC METABOLIC PANEL
ANION GAP: 7 (ref 5–15)
BUN: 13 mg/dL (ref 6–20)
CHLORIDE: 106 mmol/L (ref 101–111)
CO2: 24 mmol/L (ref 22–32)
Calcium: 8 mg/dL — ABNORMAL LOW (ref 8.9–10.3)
Creatinine, Ser: 0.73 mg/dL (ref 0.44–1.00)
Glucose, Bld: 238 mg/dL — ABNORMAL HIGH (ref 65–99)
POTASSIUM: 4 mmol/L (ref 3.5–5.1)
SODIUM: 137 mmol/L (ref 135–145)

## 2016-02-23 LAB — GLUCOSE, CAPILLARY
GLUCOSE-CAPILLARY: 190 mg/dL — AB (ref 65–99)
Glucose-Capillary: 259 mg/dL — ABNORMAL HIGH (ref 65–99)
Glucose-Capillary: 217 mg/dL — ABNORMAL HIGH (ref 65–99)
Glucose-Capillary: 242 mg/dL — ABNORMAL HIGH (ref 65–99)

## 2016-02-23 LAB — CBC
HCT: 32.5 % — ABNORMAL LOW (ref 36.0–46.0)
HEMOGLOBIN: 10.7 g/dL — AB (ref 12.0–15.0)
MCH: 25.9 pg — ABNORMAL LOW (ref 26.0–34.0)
MCHC: 32.9 g/dL (ref 30.0–36.0)
MCV: 78.7 fL (ref 78.0–100.0)
PLATELETS: 121 10*3/uL — AB (ref 150–400)
RBC: 4.13 MIL/uL (ref 3.87–5.11)
RDW: 14.8 % (ref 11.5–15.5)
WBC: 3 10*3/uL — AB (ref 4.0–10.5)

## 2016-02-23 LAB — PROTIME-INR
INR: 1.2 (ref 0.00–1.49)
PROTHROMBIN TIME: 15.4 s — AB (ref 11.6–15.2)

## 2016-02-23 LAB — MRSA PCR SCREENING: MRSA BY PCR: NEGATIVE

## 2016-02-23 NOTE — Progress Notes (Signed)
[facility] PROGRESS NOTE  [name] [name] HYI:[id number] DOB: 1941-06-11    PCP:   Purvis Kilts, MD   HPI:  [name] [name] is an 75 y.o. female with hx of cholelithiasis, gallstone pancreatitis, non-drinker, NASH with cirrhosis, DM, hyperlipidemia, chronic gastritis (Dr Dudley Major), admitted for confusion, weakness, falling, and was found to have elevated NH3 to 69, along with a UTI.  She was given Lactulose and Xifaxin, along with IV Rocephin and IVF.  She is more alert today, and wanted to go home to her husband.  PT was ordered, and HHT will be set up.    Rewiew of Systems:  Constitutional: Negative for malaise, fever and chills. No significant weight loss or weight gain Eyes: Negative for eye pain, redness and discharge, diplopia, visual changes, or flashes of light. ENMT: Negative for ear pain, hoarseness, nasal congestion, sinus pressure and sore throat. No headaches; tinnitus, drooling, or problem swallowing. Cardiovascular: Negative for chest pain, palpitations, diaphoresis, dyspnea and peripheral edema. ; No orthopnea, PND Respiratory: Negative for cough, hemoptysis, wheezing and stridor. No pleuritic chestpain. Gastrointestinal: Negative for nausea, vomiting, diarrhea, constipation, abdominal pain, melena, blood in stool, hematemesis, jaundice and rectal bleeding.    Genitourinary: Negative for frequency, dysuria, incontinence,flank pain and hematuria; Musculoskeletal: Negative for back pain and neck pain. Negative for swelling and trauma.;  Skin: . Negative for pruritus, rash, abrasions, bruising and skin lesion.; ulcerations Neuro: Negative for headache, lightheadedness and neck stiffness. Negative for weakness, altered level of consciousness , altered mental status, extremity weakness, burning feet, involuntary movement, seizure and syncope.  Psych: negative for anxiety, depression, insomnia, tearfulness, panic attacks, hallucinations, paranoia, suicidal or homicidal ideation     Past Medical History  Diagnosis Date  . CAD in native artery   . Thrombocytopenia, unspecified ([place])   . Unspecified hereditary and idiopathic peripheral neuropathy   . Unspecified fall   . Other and unspecified hyperlipidemia   . Unspecified essential hypertension   . Obesity, unspecified   . Personal history of neurosis   . Intrinsic asthma, unspecified   . Allergic rhinitis, cause unspecified   . Cirrhosis of liver (HCC)     NASH, afp on 06/17/12 =3.7, per pt she had hep B vaccines in 1996, hep A in process.   . Colon adenomas 03/08/10    tcs by Dr. Gala Romney  . Hyperplastic polyps of stomach 03/08/10    tcs by Dr. Dudley Major  . Chronic gastritis 03/09/11    egd by Dr. Gala Romney  . History of hemorrhoids 03/08/10    tcs- internal and external  . Diverticula of colon 03/08/10    L side  . Esophagitis, erosive 03/08/10  . GERD (gastroesophageal reflux disease)   . Wears glasses   . Type II or unspecified type diabetes mellitus without mention of complication, not stated as uncontrolled   . Vertigo   . Pancreatitis   . Bilateral leg weakness   . Thrombocytopenia ([place]) 04/12/2014  . Pancytopenia, acquired ([place]) 04/12/2014  . Cirrhosis ([place])   . Abnormal EKG     hx of left anterior fasicular block on 04-09-13 ekg epic  . Chronic shoulder pain     Past Surgical History  Procedure Laterality Date  . Hysterectomy and btl      s/p  . Tumor excision  2003    rt arm and left foot  . Colonoscopy  03/08/10    Dr. [name]-->ext/int hemorrhoids, anal paipilla, rectal polyps, desc polyps, cecal polyp, left-sided diverticula. suboptiomal prep. next  TCS 02/2015. multiple adenomas  . Esophagogastroduodenoscopy  03/08/10    Dr. [name] erosive RE, small hh, antral erosions, small 1cm are of mucosal indentation along gastric body of doubtful significance, cystic nodularity of hypophyarynx, base of tongue, base of epiglottis. benign gastric biopsies  . Tubal ligation    . Esophagogastroduodenoscopy   10/08/2011    Dr. [name] esophageal varices, antral erosion. next egd 09/2013  . Foot surgery  2003    lt foot  . Eye surgery      cataracts bilateral  . Orif humerus fracture Right 02/17/2013    Procedure: OPEN REDUCTION INTERNAL FIXATION (ORIF) RIGHT PROXIMAL HUMERUS FRACTURE;  Surgeon: [name], MD;  Location: [place];  Service: Orthopedics;  Laterality: Right;  . Partial gastrectomy      family denies  . Esophagogastroduodenoscopy N/A 02/17/2014    Dr. Gala Romney: portal gastropathy, no varices. Due for surveillance 2017  . Breast surgery Right few yrs ago    benign area removed  . Abdominal hysterectomy    . Cardiac catheterization  02/25/2007    Dr. [name]:  normal LV systolic function, mild irregularities of LAD    Medications:  HOME MEDS: Prior to Admission medications   Medication Sig Start Date End Date Taking? Authorizing Provider  ALPRAZolam Duanne Moron) 0.5 MG tablet Take 1 tablet (0.5 mg total) by mouth 3 (three) times daily as needed for anxiety. Patient taking differently: Take 0.5 mg by mouth 2 (two) times daily.  12/24/14  Yes [name], MD  aspirin EC 81 MG tablet Take 81 mg by mouth daily.   Yes Historical Provider, MD  budesonide-formoterol (SYMBICORT) 160-4.5 MCG/ACT inhaler Inhale 2 puffs into the lungs at bedtime.    Yes Historical Provider, MD  enalapril (VASOTEC) 5 MG tablet Take 1 tablet by mouth daily. 08/25/14  Yes Historical Provider, MD  ESTRACE VAGINAL 0.1 MG/GM vaginal cream Place 1 Applicatorful vaginally every other day. At  bedtime 08/11/13  Yes Historical Provider, MD  furosemide (LASIX) 20 MG tablet Take 1 tablet (20 mg total) by mouth every morning. Patient taking differently: Take 20 mg by mouth daily as needed for fluid.  09/03/14  Yes Mahala Menghini, PA-C  insulin aspart (NOVOLOG FLEXPEN) 100 UNIT/ML FlexPen Inject 10-16 Units into the skin 3 (three) times daily with meals. Patient taking differently: Inject 0-15 Units into the skin 3 (three)  times daily with meals.  10/10/15  Yes Cassandria Anger, MD  Insulin Detemir (LEVEMIR FLEXTOUCH) 100 UNIT/ML Pen Inject 50 Units into the skin daily at 10 pm. 01/18/16  Yes Cassandria Anger, MD  lactulose (CHRONULAC) 10 GM/15ML solution Take 10 g by mouth 4 (four) times daily.  10/20/14  Yes Historical Provider, MD  meclizine (ANTIVERT) 25 MG tablet Take 25 mg by mouth 3 (three) times daily as needed for dizziness or nausea.    Yes Historical Provider, MD  Multiple Vitamins-Minerals (CENTRUM SILVER PO) Take 1 tablet by mouth daily.    Yes Historical Provider, MD  NEXIUM 40 MG capsule Take 40 mg by mouth every morning.  06/17/14  Yes Historical Provider, MD  oxyCODONE (OXY IR/ROXICODONE) 5 MG immediate release tablet Take 5 mg by mouth 2 (two) times daily.   Yes Historical Provider, MD  pregabalin (LYRICA) 100 MG capsule Take 100 mg by mouth 2 (two) times daily.   Yes Historical Provider, MD  rifaximin (XIFAXAN) 550 MG TABS tablet Take 1 tablet (550 mg total) by mouth 2 (two) times daily. 06/28/14  Yes Kathie Dike, MD  spironolactone (ALDACTONE) 50 MG tablet Take 50 mg by mouth daily.   Yes Historical Provider, MD  triamcinolone cream (KENALOG) 0.1 % Apply 1 application topically daily as needed (for irritation).  06/26/13  Yes Historical Provider, MD  ursodiol (URSO FORTE) 500 MG tablet Take 500 mg by mouth 2 (two) times daily.   Yes Historical Provider, MD  blood glucose meter kit and supplies KIT Dispense based on patient and insurance preference. Use up to four times daily as directed. (FOR ICD-10 E11.65). 10/17/15   Cassandria Anger, MD  Insulin Pen Needle (B-D ULTRAFINE III SHORT PEN) 31G X 8 MM MISC 1 each by Does not apply route as directed. 10/10/15   Cassandria Anger, MD  ONETOUCH DELICA LANCETS 88B MISC USE UP TO FOUR TIMES DAILY AS DIRECTED 11/15/15   Cassandria Anger, MD  [facility] VERIO test strip USE UP TO FOUR TIMES DAILY AS DIRECTED 11/15/15   Cassandria Anger, MD      Allergies:  Allergies  Allergen Reactions  . Morphine And Related Itching  . Compazine [Prochlorperazine Edisylate] Anxiety    Causes anxiety & nervousness    Social History:   reports that she has never smoked. She has never used smokeless tobacco. She reports that she does not drink alcohol or use illicit drugs.  Family History: Family History  Problem Relation Age of Onset  . Coronary artery disease      FH  . Diabetes      FH  . Heart disease Mother   . Colon cancer Neg Hx      Physical Exam: Filed Vitals:   02/22/16 1810 02/22/16 2048 02/23/16 0642 02/23/16 0903  BP: 110/44 120/52 107/49 109/49  Pulse: 67 73 68 80  Temp: 97.7 F (36.5 C) 98.3 F (36.8 C) 97.7 F (36.5 C)   TempSrc: Oral Oral Oral   Resp: _0 Height: 5' 8" (1.727 m)     Weight: 111 kg (244 lb 11.4 oz)  111.4 kg (245 lb 9.5 oz)   SpO2:  96% 93% 98%   Blood pressure 109/49, pulse 80, temperature 97.7 F (36.5 C), temperature source Oral, resp. rate 20, height 5' 8" (1.727 m), weight 111.4 kg (245 lb 9.5 oz), SpO2 98 %.  GEN:  Pleasant  patient lying in the stretcher in no acute distress; cooperative with exam. PSYCH:  alert and oriented x4; does not appear anxious or depressed; affect is appropriate. HEENT: Mucous membranes pink and anicteric; PERRLA; EOM intact; no cervical lymphadenopathy nor thyromegaly or carotid bruit; no JVD; There were no stridor. Neck is very supple. Breasts:: Not examined CHEST WALL: No tenderness CHEST: Normal respiration, clear to auscultation bilaterally.  HEART: Regular rate and rhythm.  There are no murmur, rub, or gallops.   BACK: No kyphosis or scoliosis; no CVA tenderness ABDOMEN: soft and non-tender; no masses, no organomegaly, normal abdominal bowel sounds; no pannus; no intertriginous candida. There is no rebound and no distention. Rectal Exam: Not done EXTREMITIES: No bone or joint deformity; age-appropriate arthropathy of the hands and knees;  no edema; no ulcerations.  There is no calf tenderness. Genitalia: not examined PULSES: 2+ and symmetric SKIN: Normal hydration no rash or ulceration CNS: Cranial nerves 2-12 grossly intact no focal lateralizing neurologic deficit.  Speech is fluent; uvula elevated with phonation, facial symmetry and tongue midline. DTR are normal bilaterally, cerebella exam is intact, barbinski is negative and strengths are equaled bilaterally.  No sensory loss.   Labs on Admission:  Basic Metabolic Panel:  Recent Labs Lab 02/22/16 1235 02/23/16 0547  NA 138 137  K 4.3 4.0  CL 106 106  CO2 24 24  GLUCOSE 231* 238*  BUN 14 13  CREATININE 0.79 0.73  CALCIUM 8.5* 8.0*   Liver Function Tests:  Recent Labs Lab 02/22/16 1235  AST 17  ALT 12*  ALKPHOS 100  BILITOT 0.8  PROT 5.5*  ALBUMIN 2.9*   No results for input(s): LIPASE, AMYLASE in the last 168 hours.  Recent Labs Lab 02/22/16 1235  AMMONIA 85*   CBC:  Recent Labs Lab 02/22/16 1235 02/23/16 0547  WBC 4.6 3.0*  NEUTROABS 2.9  --   HGB 11.5* 10.7*  HCT 34.9* 32.5*  MCV 78.6 78.7  PLT 155 121*   CBG:  Recent Labs Lab 02/22/16 2046 02/23/16 0734  GLUCAP 174* 190*    Radiological Exams on Admission: Ct Head Wo Contrast  02/22/2016  CLINICAL DATA:  Weakness and frequent falls. EXAM: CT HEAD WITHOUT CONTRAST TECHNIQUE: Contiguous axial images were obtained from the base of the skull through the vertex without intravenous contrast. COMPARISON:  11/09/2014 FINDINGS: Skull and Sinuses:Negative for fracture or destructive process. The visualized mastoids, middle ears, and imaged paranasal sinuses are clear. Visualized orbits: Bilateral cataract resection Brain: No evidence of acute infarction, hemorrhage, hydrocephalus, or mass lesion/mass effect. Generalized cortical atrophy that is essentially stable from 2015. Stable small vessel ischemic change in the biparietal white matter. IMPRESSION: No acute finding or change since 2015.  Electronically Signed   By: Monte Fantasia M.D.   On: 02/22/2016 13:47    Assessment/Plan Present on Admission:  . Hyperammonemia ([place]) . (Resolved) Bilateral leg weakness . Difficulty walking . Encephalopathy, hepatic ([place]) . Essential hypertension . Fall . (Resolved) Hepatic cirrhosis ([place]) . Morbid obesity ([place]) . Liver cirrhosis secondary to NASH . Physical deconditioning . UTI (urinary tract infection)  PLAN:  Hepatic encephalopathy:  Improving, will check another level tomorrow, and plan discharge tomorrow as well.   UTI:  Continue with IV Rocephin.  She is doing better.   Weakness:  PT, and will plan d/c home tomorrow with [facility].  Other plans as per orders. Code Status: FULL Haskel Khan, MD.  FACP [facility] Pager [phone] 7pm to 7am.  02/23/2016, 2:26 PM

## 2016-02-23 NOTE — Care Management Obs Status (Signed)
[facility] NOTIFICATION   Patient Details  Name: LEIANN AUTIN MRN: [id number] Date of Birth: [birth year]   Medicare Observation Status Notification Given:  Yes    Alvie Heidelberg, RN 02/23/2016, 3:11 PM

## 2016-02-23 NOTE — Consult Note (Signed)
   [facility] CM Inpatient Consult   02/23/2016  [name] [name] 07/31/1941 ID:[id number]  Spoke with patient and her spouse at bedside regarding [facility] services.  Patient does not wish to participate with [facility] at this time. Patient given [facility] brochure and contact information for future reference.  Of note, [facility] Care Management services would not replace or interfere with any services that are arranged by inpatient case management or social work. For additional questions or referrals please contact:  Royetta Crochet. Laymond Purser, RN, BSN, [facility] Liaison [phone]

## 2016-02-23 NOTE — Evaluation (Signed)
Physical Therapy Evaluation Patient Details Name: [name] [name] MRN: [id number] DOB: [birth year] Today's Date: 02/23/2016   History of Present Illness  [name] [name] is a 75 y.o. female with hx of NASH/ cirrhosis and DM w neuropathy, gallstones, R broken arm - non operable, and HTN presenting to ED with worsening confusion and gen'd weakness. Falling more, unable to get OOB , needs more help to get to [facility]. Came here also as thinks she has UTI due to "odor" no dysuria. In ED UA + for [facility] tntc and many bact. NH3 up in 80's, last was 60's last year. Asked to see for hosp admission.   Clinical Impression  Pt received in bed, and was agreeable to PT evaluation.  Pt expressed need to have a BM, therefore, pt was assisted onto [facility].  Pt required Min/Mod A for stand pivot transfer, and Min/Mod A for multiple sit<>stand transfers to don undergarments and pants.  Pt fatigued after, and only able to ambulate 2 ft with Mod A and RW prior to being assisted down onto the EOB due to B knees buckling.  Pt required Mod A for sit<>supine. Pt states her husband assists her at home with transfers and mobility, and she has an aid that comes 5days/wk for 8hrs that also assists her with ADL's and IADL's.  Pt mostly uses w/c for mobility at this point, but continues to have falls.  Therefore, pt would benefit from continued skilled PT to optimize her return to PLOF, and follow up with HHPT at d/c.     Follow Up Recommendations Home health PT;Other (comment) (with 24/7 assistance)    Equipment Recommendations  None recommended by PT    Recommendations for Other Services       Precautions / Restrictions Precautions Precautions: Fall Precaution Comments: Pt has been falling more at home.  Restrictions Weight Bearing Restrictions: No      Mobility  Bed Mobility Overal bed mobility: Needs Assistance Bed Mobility: Supine to Sit     Supine to sit: Min assist;HOB elevated (HOB nearly completely elevated,  with pt using bed rails to assist her trunk up into sitting position. ) Sit to supine: Mod assist (Pt required assistance to lift LE's up into the bed. )   General bed mobility comments: Seated Lateral scoot along EOB: with supervision.  Performed to assist with positioning in the bed prior to transitioning to supine.   Transfers Overall transfer level: Needs assistance Equipment used: Rolling walker (2 wheeled) Transfers: Stand Pivot Transfers;Sit to/from Stand Sit to Stand: Min assist;From elevated surface;Mod assist (From [facility] with RW.  Vc's to push from the [facility] each time. ) Stand pivot transfers: Min assist;Mod assist (Vc's for correct hand placement on the [facility] in order to perform this transfer from the bed<>BSC. )       General transfer comment: Pt transferred via stand pivot transfer from bed<>BSC with no AD.  Pt was dependent for perihygiene afterwards.  Pt requested to don brief and pants after, and required Max A to do so.  Due to pt's fatigue level with standing tolerance, she performed sit<>stand x 6 trials to get undergarments and clothing donned.   Ambulation/Gait Ambulation/Gait assistance: Mod assist Ambulation Distance (Feet): 2 Feet Assistive device: Rolling walker (2 wheeled) Gait Pattern/deviations: Step-to pattern     General Gait Details: Pt with limited distance due to fatigue after multiple sit<>stand's with toileting.  Pt demonstrated B Knee buckling.  BSC quickly pushed into place behind pt, where she was  able to sit down to rest.  Pt then stood, and BSC removed, and pt attempted retro gait to the bed, but again with B Knee buckling, and PT assisted pt into sitting position down on the bed.   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  Pain Descriptors / Indicators: Aching Pain Intervention(s): Limited activity within patient's tolerance;Repositioned    Home Living Family/patient expects to be discharged to:: Private residence Living Arrangements: Spouse/significant other Available Help at Discharge: Family;Personal care attendant (Pt has an aid 5 days/wk for 8 hrs/day that assists with dressing, bathing, meal prep, etc. ) Type of Home: House Home Access: Ramped entrance     Home Layout: One level Home Equipment: Walker - 2 wheels;Cane - single point;Bedside commode;Wheelchair - Press photographer      Prior Function Level of Independence: Needs assistance   Gait / Transfers Assistance Needed:  Pt states she mostly uses her w/c for mobility, and is able to propel it herself with her UE's.  She states that she is able to ambulate from one room to the next, however pt's strengh and history do not support this statement.   ADL's / Homemaking Assistance Needed: Pt states that her husband and her aid help her with bed mobility, transfers, dressing, bathing, meal prep.         Hand Dominance        Extremity/Trunk Assessment   Upper Extremity Assessment: Generalized weakness RUE Deficits / Details: hx fracutre of R shoulder.  Limited AROM.  Generalized weakness otherwise.         Lower Extremity Assessment: Generalized weakness RLE Deficits / Details: Grossly 4/5 LLE Deficits / Details: Grossly 4/5     Communication   Communication: No difficulties  Cognition Arousal/Alertness: Awake/alert Behavior During Therapy: WFL for tasks assessed/performed Overall Cognitive Status: Within Functional Limits for tasks assessed          Following Commands: Follows one step commands consistently       General Comments: Pt demonstrates slow processing, but is able to complete 1 step commands.     General Comments      Exercises        Assessment/Plan    PT Assessment Patient needs continued PT services  PT Diagnosis Generalized weakness;Difficulty walking   PT Problem List Decreased strength;Decreased activity tolerance;Decreased balance;Decreased mobility;Decreased safety awareness;Decreased knowledge of precautions;Obesity  PT Treatment Interventions Gait training;Functional mobility training;Therapeutic activities;Therapeutic exercise;Balance training;Patient/family education;Wheelchair mobility training   PT Goals (Current goals can be found in the Care Plan section) Acute Rehab PT Goals Patient Stated Goal: Pt states she wants to go home PT Goal Formulation: With patient Time For Goal Achievement: 03/08/16 Potential to Achieve Goals: Fair    Frequency Min 3X/week   Barriers to discharge        Co-evaluation  End of Session Equipment Utilized During Treatment: Gait belt Activity Tolerance: Patient tolerated treatment well Patient left: in bed;with call bell/phone within reach;with bed alarm set Nurse Communication: Mobility status (RN notified of pt's location, that IV was initially found to be wrapped multiple times around call bell and telephone cord, and now seemed to be leaking. )    Functional Assessment Tool Used: Clinical Judgement Functional Limitation: Changing and maintaining body position Changing and Maintaining Body Position Current Status [id number]): At least 40 percent but less than 60 percent impaired, limited or restricted Changing and Maintaining Body Position Goal Status [id number]): At least 20 percent but less than 40 percent impaired, limited or restricted    Time: [id number] PT Time Calculation (min) (ACUTE ONLY): 38 min   Charges:   PT Evaluation $PT Eval  Moderate Complexity: 1 Procedure PT Treatments $Therapeutic Activity: 23-37 mins   PT G Codes:   PT G-Codes **NOT FOR INPATIENT CLASS** Functional Assessment Tool Used: Clinical Judgement Functional Limitation: Changing and maintaining body position Changing and Maintaining Body Position Current Status [id number]): At least 40 percent but less than 60 percent impaired, limited or restricted Changing and Maintaining Body Position Goal Status [id number]): At least 20 percent but less than 40 percent impaired, limited or restricted    Tacy Learn, PT, DPT X: [id number]   02/23/2016, 12:47 PM

## 2016-02-23 NOTE — Care Management Note (Addendum)
Case Management Note  Patient Details  Name: [name] MRN: [id number] Date of Birth: 01/29/1941  Subjective/Objective:   Spoke with patient who is alert and oriented from home with husband. Patient stated that she has a wheelchair, a rolling walker, a rollator, a electric scooter, and a BSC. She does not use home O2. She is already open to [place] with a RN. She has a home care aid that comes by daily.   Will add HH PT to orders per physical therapy recommendations. Patient is  CAP . No other CM needs identified.              Action/Plan:Home with home health.   Expected Discharge Date:                  Expected Discharge Plan:     In-House Referral:     Discharge planning Services  CM Consult  Post Acute Care Choice:  Resumption of Svcs/PTA Provider Choice offered to:     DME Arranged:    DME Agency:  [place]:  PT, RN [facility] Agency:  [place]  Status of Service:  Completed, signed off  Medicare Important Message Given:    Date Medicare IM Given:    Medicare IM give by:    Date Additional Medicare IM Given:    Additional Medicare Important Message give by:     If discussed at [place] of Stay Meetings, dates discussed:    Additional Comments:  Alvie Heidelberg, RN 02/23/2016, 2:06 PM

## 2016-02-24 DIAGNOSIS — M6281 Muscle weakness (generalized): Secondary | ICD-10-CM | POA: Diagnosis not present

## 2016-02-24 DIAGNOSIS — R29898 Other symptoms and signs involving the musculoskeletal system: Secondary | ICD-10-CM | POA: Diagnosis not present

## 2016-02-24 DIAGNOSIS — R262 Difficulty in walking, not elsewhere classified: Secondary | ICD-10-CM | POA: Diagnosis not present

## 2016-02-24 DIAGNOSIS — K7290 Hepatic failure, unspecified without coma: Secondary | ICD-10-CM | POA: Diagnosis not present

## 2016-02-24 DIAGNOSIS — R531 Weakness: Secondary | ICD-10-CM | POA: Diagnosis not present

## 2016-02-24 LAB — BASIC METABOLIC PANEL
Anion gap: 7 (ref 5–15)
BUN: 11 mg/dL (ref 6–20)
CALCIUM: 8.3 mg/dL — AB (ref 8.9–10.3)
CHLORIDE: 109 mmol/L (ref 101–111)
CO2: 25 mmol/L (ref 22–32)
CREATININE: 0.71 mg/dL (ref 0.44–1.00)
GFR calc Af Amer: >60 mL/min (ref >60)
GFR calc non Af Amer: >60 mL/min (ref >60)
GLUCOSE: 135 mg/dL — AB (ref 65–99)
Potassium: 3.8 mmol/L (ref 3.5–5.1)
Sodium: 141 mmol/L (ref 135–145)

## 2016-02-24 LAB — GLUCOSE, CAPILLARY
Glucose-Capillary: 111 mg/dL — ABNORMAL HIGH (ref 65–99)
Glucose-Capillary: 204 mg/dL — ABNORMAL HIGH (ref 65–99)

## 2016-02-24 LAB — AMMONIA: Ammonia: 44 umol/L — ABNORMAL HIGH (ref 9–35)

## 2016-02-24 NOTE — Progress Notes (Signed)
Physical Therapy Treatment Patient Details Name: [name] [name] MRN: [id number] DOB: 26-Jul-1941 Today's Date: 02/24/2016    History of Present Illness [name] [name] is a 75 y.o. female with hx of NASH/ cirrhosis and DM w neuropathy, gallstones, R broken arm - non operable, and HTN presenting to ED with worsening confusion and gen'd weakness. Falling more, unable to get OOB , needs more help to get to [facility]. Came here also as thinks she has UTI due to "odor" no dysuria. In ED UA + for [facility] tntc and many bact. NH3 up in 80's, last was 60's last year. Asked to see for hosp admission.     PT Comments    Pt received in bed, and was agreeable to PT tx.  Pt was excited that she may get to d/c home today.  Pt demonstrated improved bed mobility at supervision/CGA level, however she still requires Min A for sit<>stand transfer, and Min/Mod A for stand pivot transfer from bed<>chair with RW.  During transfer, pt's LE's seem to fatigue, and become tremulous.  Pt continues to require vc's for safe hand placement during transfers, and to get lined up with the chair prior to sitting down.  Pt did well with LE seated therapeutic exercises.  Continue to recommend HHPT upon d/c.   Follow Up Recommendations  Home health PT;Supervision/Assistance - 24 hour     Equipment Recommendations  None recommended by PT    Recommendations for Other Services       Precautions / Restrictions Precautions Precautions: Fall    Mobility  Bed Mobility Overal bed mobility: Needs Assistance Bed Mobility: Supine to Sit     Supine to sit: Supervision;Min guard        Transfers Overall transfer level: Needs assistance Equipment used: Rolling walker (2 wheeled) Transfers: Sit to/from [facility] Sit to Stand: Min assist;From elevated surface (from bed) Stand pivot transfers: Min assist;Mod assist       General transfer comment: Pt was able to take several steps to transfer from the bed<>chair,  however pt demonstrates LE unsteadiness/tremulous.  Poor ability to get fully lined up with the chair, needs cues to reach back for the chair for safe landing.   Ambulation/Gait Ambulation/Gait assistance:  (NA)               Stairs            Wheelchair Mobility    Modified Rankin (Stroke Patients Only)       Balance Overall balance assessment: Needs assistance Sitting-balance support: Single extremity supported Sitting balance-Leahy Scale: Fair     Standing balance support: Bilateral upper extremity supported Standing balance-Leahy Scale: Poor                      Cognition Arousal/Alertness: Awake/alert Behavior During Therapy: WFL for tasks assessed/performed Overall Cognitive Status: Within Functional Limits for tasks assessed         Following Commands: Follows multi-step commands inconsistently            Exercises General Exercises - Lower Extremity Long Arc Quad: Strengthening;Both;10 reps;Other (comment) (x2 sets) Hip Flexion/Marching: Strengthening;Both;10 reps;Other (comment) (x2 sets) Other Exercises Other Exercises: anterior weight shifting in sitting x 10 reps to improve weight shift needed for transfers sit<>stand.     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  Pain Descriptors / Indicators: Aching Pain Intervention(s): Limited activity within patient's tolerance;Repositioned    Home Living                      Prior Function            PT Goals (current goals can now be found in the care plan section) Acute Rehab PT Goals Patient Stated Goal: Pt states she wants to go home PT Goal Formulation: With patient Time For Goal Achievement: 03/08/16 Potential to Achieve Goals: Good Progress towards PT goals: Progressing toward goals    Frequency  Min 3X/week    PT Plan Current plan remains  appropriate    Co-evaluation             End of Session Equipment Utilized During Treatment: Gait belt Activity Tolerance: Patient tolerated treatment well Patient left: in chair;with call bell/phone within reach     Time: [id number] PT Time Calculation (min) (ACUTE ONLY): 25 min  Charges:  $Therapeutic Exercise: 8-22 mins $Therapeutic Activity: 8-22 mins                    G Codes:      Beth Marnae Madani, PT, DPT X: [id number]   02/24/2016, 12:11 PM

## 2016-02-24 NOTE — Progress Notes (Signed)
Patient discharged with instructions, prescription, and care notes.  Verbalized understanding via teach back.  IV was removed and the site was WNL. Patient voiced no further complaints or concerns at the time of discharge.  Appointments scheduled per instructions.  Patient left the floor via w/c with staff and family in stable condition. 

## 2016-02-24 NOTE — Discharge Summary (Signed)
Physician Discharge Summary  [name] [name] [id number] DOB: 06-26-1941 DOA: 02/22/2016  PCP: [name] Kilts, MD  Admit date: 02/22/2016 Discharge date: 02/24/2016  Time spent: 45 minutes  Recommendations for Outpatient Follow-up:  1. Follow up with your PCP in one week    Discharge Diagnoses:  Principal Problem:   Generalized weakness Active Problems:   Liver cirrhosis secondary to NASH   Essential hypertension   Peripheral neuropathy (HCC)   Multiple falls   Difficulty walking   UTI (urinary tract infection)   Encephalopathy, hepatic (HCC)   Physical deconditioning   Morbid obesity ([place])   Hyperammonemia ([place])   Fall   UTI (lower urinary tract infection)   Discharge Condition: Markedly better.   Alert, orient, and no weakness.  Diet recommendation: As tolerated.   Filed Weights   02/22/16 1810 02/23/16 0642 02/24/16 0338  Weight: 111 kg (244 lb 11.4 oz) 111.4 kg (245 lb 9.5 oz) 110.2 kg (242 lb 15.2 oz)    History of present illness: Patient was admitted by Dr [name] [name] on April 10/2016 for UTI and hepatic encephalopathy, causing weakness, confusion, and fallings.  As per his H and P:  "[name] [name] is a 75 y.o. female with hx of NASH/ cirrhosis and DM w neuropathy, gallstones and HTN presenting to ED with worsening confusion and gen'd weakness. Falling more, unable to get OOB , needs more help to get to [name]. Came here also as thinks she has UTI due to "odor" no dysuria. In ED UA + for [name] tntc and many bact. NH3 up in 80's, last was 60's last year. Asked to see for hosp admission.   Patient dx'd cirrhosis 2010. In 2012 was still driving. In 2014 much worse, fell and broke R arm, they operated on it but fell again and rebroke it and they "wouldn't go back in". 2015 "in and out" of hospital per husband with confusion/ falls / [name] / weakness. Was seen at [facility] and not candidate for transplant and has gallstones but "not a candidate for surgery" because of her  liver disease.   Chronic [name] [name] edema, chron [name] [name] pain d/t neuropathy on Lyrica. No high fever, chills, CP, SOB, cough. No abd pain or n/v/d. No joitn pain or rash.      Hospital Course: [name] [name] is an 75 y.o. female with hx of cholelithiasis, gallstone pancreatitis, non-drinker, NASH with cirrhosis, DM, hyperlipidemia, chronic gastritis (Dr [name] [name]), admitted for confusion, weakness, falling, and was found to have elevated NH3 to 34, along with a UTI. She was given Lactulose and Xifaxin, along with IV Rocephin and IVF.Her NH3 level came down and she became more alert very quickly and wanted to go home to her husband. PT was ordered, and she stayed an extra day for IV antibiotics, and became stronger.  She is now stable for discharge.   She will take her lactulose religiously, along with her Xifaxin.   She no longer needs further antibiotics for her UTI.  Thank you and have a great day.    Discharge Exam: Filed Vitals:   02/23/16 2108 02/24/16 0338  BP: 112/50 110/45  Pulse: 82 71  Temp: 98.1 F (36.7 C) 98.3 F (36.8 C)  Resp: 20 20    Discharge Instructions   Discharge Instructions    Diet - low sodium heart healthy    Complete by:  As directed      Discharge instructions    Complete by:  As directed   Take your medication  as directed.  You are done with the antibiotics, so you don't need it.     Increase activity slowly    Complete by:  As directed           Current Discharge Medication List    CONTINUE these medications which have NOT CHANGED   Details  ALPRAZolam (XANAX) 0.5 MG tablet Take 1 tablet (0.5 mg total) by mouth 3 (three) times daily as needed for anxiety. Qty: 20 tablet, Refills: 0    aspirin EC 81 MG tablet Take 81 mg by mouth daily.    budesonide-formoterol (SYMBICORT) 160-4.5 MCG/ACT inhaler Inhale 2 puffs into the lungs at bedtime.     enalapril (VASOTEC) 5 MG tablet Take 1 tablet by mouth daily.    ESTRACE VAGINAL 0.1 MG/GM vaginal cream Place 1  Applicatorful vaginally every other day. At  bedtime    furosemide (LASIX) 20 MG tablet Take 1 tablet (20 mg total) by mouth every morning. Qty: 30 tablet, Refills: 5    insulin aspart (NOVOLOG FLEXPEN) 100 UNIT/ML FlexPen Inject 10-16 Units into the skin 3 (three) times daily with meals. Qty: 15 mL, Refills: 2    Insulin Detemir (LEVEMIR FLEXTOUCH) 100 UNIT/ML Pen Inject 50 Units into the skin daily at 10 pm. Qty: 15 mL, Refills: 2    lactulose (CHRONULAC) 10 GM/15ML solution Take 10 g by mouth 4 (four) times daily.  Refills: 0    Multiple Vitamins-Minerals (CENTRUM SILVER PO) Take 1 tablet by mouth daily.     NEXIUM 40 MG capsule Take 40 mg by mouth every morning.     pregabalin (LYRICA) 100 MG capsule Take 100 mg by mouth 2 (two) times daily.    rifaximin (XIFAXAN) 550 MG TABS tablet Take 1 tablet (550 mg total) by mouth 2 (two) times daily. Qty: 60 tablet, Refills: 1    spironolactone (ALDACTONE) 50 MG tablet Take 50 mg by mouth daily.    triamcinolone cream (KENALOG) 0.1 % Apply 1 application topically daily as needed (for irritation).     ursodiol (URSO FORTE) 500 MG tablet Take 500 mg by mouth 2 (two) times daily.    blood glucose meter kit and supplies KIT Dispense based on patient and insurance preference. Use up to four times daily as directed. (FOR ICD-10 E11.65). Qty: 1 each, Refills: 0   Associated Diagnoses: Uncontrolled type 2 diabetes mellitus with other specified complication (HCC)    Insulin Pen Needle (B-D ULTRAFINE III SHORT PEN) 31G X 8 MM MISC 1 each by Does not apply route as directed. Qty: 150 each, Refills: 3    ONETOUCH DELICA LANCETS 70Y MISC USE UP TO FOUR TIMES DAILY AS DIRECTED Qty: 150 each, Refills: 5    ONETOUCH VERIO test strip USE UP TO FOUR TIMES DAILY AS DIRECTED Qty: 150 each, Refills: 5      STOP taking these medications     meclizine (ANTIVERT) 25 MG tablet      oxyCODONE (OXY IR/ROXICODONE) 5 MG immediate release tablet         Allergies  Allergen Reactions  . Morphine And Related Itching  . Compazine [Prochlorperazine Edisylate] Anxiety    Causes anxiety & nervousness      The results of significant diagnostics from this hospitalization (including imaging, microbiology, ancillary and laboratory) are listed below for reference.    Significant Diagnostic Studies: Ct Head Wo Contrast  02/22/2016  CLINICAL DATA:  Weakness and frequent falls. EXAM: CT HEAD WITHOUT CONTRAST TECHNIQUE: Contiguous axial images were  obtained from the base of the skull through the vertex without intravenous contrast. COMPARISON:  11/09/2014 FINDINGS: Skull and Sinuses:Negative for fracture or destructive process. The visualized mastoids, middle ears, and imaged paranasal sinuses are clear. Visualized orbits: Bilateral cataract resection Brain: No evidence of acute infarction, hemorrhage, hydrocephalus, or mass lesion/mass effect. Generalized cortical atrophy that is essentially stable from 2015. Stable small vessel ischemic change in the biparietal white matter. IMPRESSION: No acute finding or change since 2015. Electronically Signed   By: Monte Fantasia M.D.   On: 02/22/2016 13:47    Microbiology: Recent Results (from the past 240 hour(s))  MRSA PCR Screening     Status: None   Collection Time: 02/23/16  1:09 PM  Result Value Ref Range Status   MRSA by PCR NEGATIVE NEGATIVE Final    Comment:        The GeneXpert MRSA Assay (FDA approved for NASAL specimens only), is one component of a comprehensive MRSA colonization surveillance program. It is not intended to diagnose MRSA infection nor to guide or monitor treatment for MRSA infections.      Labs: Basic Metabolic Panel:  Recent Labs Lab 02/22/16 1235 02/23/16 0547 02/24/16 0557  NA 138 137 141  K 4.3 4.0 3.8  CL 106 106 109  CO2 '24 24 25  ' GLUCOSE 231* 238* 135*  BUN '14 13 11  ' CREATININE 0.79 0.73 0.71  CALCIUM 8.5* 8.0* 8.3*   Liver Function Tests:  Recent  Labs Lab 02/22/16 1235  AST 17  ALT 12*  ALKPHOS 100  BILITOT 0.8  PROT 5.5*  ALBUMIN 2.9*    Recent Labs Lab 02/22/16 1235 02/24/16 0557  AMMONIA 85* 44*   CBC:  Recent Labs Lab 02/22/16 1235 02/23/16 0547  WBC 4.6 3.0*  NEUTROABS 2.9  --   HGB 11.5* 10.7*  HCT 34.9* 32.5*  MCV 78.6 78.7  PLT 155 121*    CBG:  Recent Labs Lab 02/23/16 1214 02/23/16 1630 02/23/16 2115 02/24/16 0754 02/24/16 1134  GLUCAP 259* 217* 242* 111* 204*    Signed:  Sara Keys MD.  [facility] 02/24/2016, 12:19 PM

## 2016-02-27 DIAGNOSIS — K8020 Calculus of gallbladder without cholecystitis without obstruction: Secondary | ICD-10-CM | POA: Diagnosis not present

## 2016-02-27 DIAGNOSIS — E1142 Type 2 diabetes mellitus with diabetic polyneuropathy: Secondary | ICD-10-CM | POA: Diagnosis not present

## 2016-02-27 DIAGNOSIS — I2510 Atherosclerotic heart disease of native coronary artery without angina pectoris: Secondary | ICD-10-CM | POA: Diagnosis not present

## 2016-02-27 DIAGNOSIS — E7220 Disorder of urea cycle metabolism, unspecified: Secondary | ICD-10-CM | POA: Diagnosis not present

## 2016-02-27 DIAGNOSIS — N390 Urinary tract infection, site not specified: Secondary | ICD-10-CM | POA: Diagnosis not present

## 2016-02-27 DIAGNOSIS — L089 Local infection of the skin and subcutaneous tissue, unspecified: Secondary | ICD-10-CM | POA: Diagnosis not present

## 2016-02-27 DIAGNOSIS — K7460 Unspecified cirrhosis of liver: Secondary | ICD-10-CM | POA: Diagnosis not present

## 2016-02-28 DIAGNOSIS — E1142 Type 2 diabetes mellitus with diabetic polyneuropathy: Secondary | ICD-10-CM | POA: Diagnosis not present

## 2016-02-28 DIAGNOSIS — K7460 Unspecified cirrhosis of liver: Secondary | ICD-10-CM | POA: Diagnosis not present

## 2016-02-28 DIAGNOSIS — L089 Local infection of the skin and subcutaneous tissue, unspecified: Secondary | ICD-10-CM | POA: Diagnosis not present

## 2016-02-28 DIAGNOSIS — K8020 Calculus of gallbladder without cholecystitis without obstruction: Secondary | ICD-10-CM | POA: Diagnosis not present

## 2016-02-28 DIAGNOSIS — N390 Urinary tract infection, site not specified: Secondary | ICD-10-CM | POA: Diagnosis not present

## 2016-02-28 DIAGNOSIS — E7220 Disorder of urea cycle metabolism, unspecified: Secondary | ICD-10-CM | POA: Diagnosis not present

## 2016-02-28 DIAGNOSIS — I2510 Atherosclerotic heart disease of native coronary artery without angina pectoris: Secondary | ICD-10-CM | POA: Diagnosis not present

## 2016-03-01 DIAGNOSIS — Z1389 Encounter for screening for other disorder: Secondary | ICD-10-CM | POA: Diagnosis not present

## 2016-03-01 DIAGNOSIS — N760 Acute vaginitis: Secondary | ICD-10-CM | POA: Diagnosis not present

## 2016-03-01 DIAGNOSIS — K7460 Unspecified cirrhosis of liver: Secondary | ICD-10-CM | POA: Diagnosis not present

## 2016-03-01 DIAGNOSIS — K8020 Calculus of gallbladder without cholecystitis without obstruction: Secondary | ICD-10-CM | POA: Diagnosis not present

## 2016-03-01 DIAGNOSIS — I2510 Atherosclerotic heart disease of native coronary artery without angina pectoris: Secondary | ICD-10-CM | POA: Diagnosis not present

## 2016-03-01 DIAGNOSIS — N390 Urinary tract infection, site not specified: Secondary | ICD-10-CM | POA: Diagnosis not present

## 2016-03-01 DIAGNOSIS — L089 Local infection of the skin and subcutaneous tissue, unspecified: Secondary | ICD-10-CM | POA: Diagnosis not present

## 2016-03-01 DIAGNOSIS — E7220 Disorder of urea cycle metabolism, unspecified: Secondary | ICD-10-CM | POA: Diagnosis not present

## 2016-03-01 DIAGNOSIS — G9340 Encephalopathy, unspecified: Secondary | ICD-10-CM | POA: Diagnosis not present

## 2016-03-01 DIAGNOSIS — E1142 Type 2 diabetes mellitus with diabetic polyneuropathy: Secondary | ICD-10-CM | POA: Diagnosis not present

## 2016-03-05 DIAGNOSIS — K8020 Calculus of gallbladder without cholecystitis without obstruction: Secondary | ICD-10-CM | POA: Diagnosis not present

## 2016-03-05 DIAGNOSIS — I2510 Atherosclerotic heart disease of native coronary artery without angina pectoris: Secondary | ICD-10-CM | POA: Diagnosis not present

## 2016-03-05 DIAGNOSIS — N390 Urinary tract infection, site not specified: Secondary | ICD-10-CM | POA: Diagnosis not present

## 2016-03-05 DIAGNOSIS — L089 Local infection of the skin and subcutaneous tissue, unspecified: Secondary | ICD-10-CM | POA: Diagnosis not present

## 2016-03-05 DIAGNOSIS — E1142 Type 2 diabetes mellitus with diabetic polyneuropathy: Secondary | ICD-10-CM | POA: Diagnosis not present

## 2016-03-05 DIAGNOSIS — E7220 Disorder of urea cycle metabolism, unspecified: Secondary | ICD-10-CM | POA: Diagnosis not present

## 2016-03-05 DIAGNOSIS — K7460 Unspecified cirrhosis of liver: Secondary | ICD-10-CM | POA: Diagnosis not present

## 2016-03-06 DIAGNOSIS — L089 Local infection of the skin and subcutaneous tissue, unspecified: Secondary | ICD-10-CM | POA: Diagnosis not present

## 2016-03-06 DIAGNOSIS — I2510 Atherosclerotic heart disease of native coronary artery without angina pectoris: Secondary | ICD-10-CM | POA: Diagnosis not present

## 2016-03-06 DIAGNOSIS — K7460 Unspecified cirrhosis of liver: Secondary | ICD-10-CM | POA: Diagnosis not present

## 2016-03-06 DIAGNOSIS — N390 Urinary tract infection, site not specified: Secondary | ICD-10-CM | POA: Diagnosis not present

## 2016-03-06 DIAGNOSIS — E7220 Disorder of urea cycle metabolism, unspecified: Secondary | ICD-10-CM | POA: Diagnosis not present

## 2016-03-06 DIAGNOSIS — E1142 Type 2 diabetes mellitus with diabetic polyneuropathy: Secondary | ICD-10-CM | POA: Diagnosis not present

## 2016-03-06 DIAGNOSIS — K8020 Calculus of gallbladder without cholecystitis without obstruction: Secondary | ICD-10-CM | POA: Diagnosis not present

## 2016-03-08 DIAGNOSIS — L089 Local infection of the skin and subcutaneous tissue, unspecified: Secondary | ICD-10-CM | POA: Diagnosis not present

## 2016-03-08 DIAGNOSIS — E7220 Disorder of urea cycle metabolism, unspecified: Secondary | ICD-10-CM | POA: Diagnosis not present

## 2016-03-08 DIAGNOSIS — N390 Urinary tract infection, site not specified: Secondary | ICD-10-CM | POA: Diagnosis not present

## 2016-03-08 DIAGNOSIS — K8020 Calculus of gallbladder without cholecystitis without obstruction: Secondary | ICD-10-CM | POA: Diagnosis not present

## 2016-03-08 DIAGNOSIS — K7460 Unspecified cirrhosis of liver: Secondary | ICD-10-CM | POA: Diagnosis not present

## 2016-03-08 DIAGNOSIS — I2510 Atherosclerotic heart disease of native coronary artery without angina pectoris: Secondary | ICD-10-CM | POA: Diagnosis not present

## 2016-03-08 DIAGNOSIS — E1142 Type 2 diabetes mellitus with diabetic polyneuropathy: Secondary | ICD-10-CM | POA: Diagnosis not present

## 2016-03-10 ENCOUNTER — Emergency Department (HOSPITAL_COMMUNITY): Payer: Medicare Other

## 2016-03-10 ENCOUNTER — Encounter (HOSPITAL_COMMUNITY): Payer: Self-pay | Admitting: Emergency Medicine

## 2016-03-10 ENCOUNTER — Emergency Department (HOSPITAL_COMMUNITY)
Admission: EM | Admit: 2016-03-10 | Discharge: 2016-03-10 | Disposition: A | Discharge status: Home / Self Care | Payer: Medicare Other | Attending: Emergency Medicine | Admitting: Emergency Medicine

## 2016-03-10 DIAGNOSIS — Z7982 Long term (current) use of aspirin: Secondary | ICD-10-CM | POA: Diagnosis not present

## 2016-03-10 DIAGNOSIS — J45909 Unspecified asthma, uncomplicated: Secondary | ICD-10-CM | POA: Diagnosis not present

## 2016-03-10 DIAGNOSIS — Z794 Long term (current) use of insulin: Secondary | ICD-10-CM | POA: Insufficient documentation

## 2016-03-10 DIAGNOSIS — E669 Obesity, unspecified: Secondary | ICD-10-CM | POA: Diagnosis not present

## 2016-03-10 DIAGNOSIS — I10 Essential (primary) hypertension: Secondary | ICD-10-CM | POA: Insufficient documentation

## 2016-03-10 DIAGNOSIS — R531 Weakness: Secondary | ICD-10-CM | POA: Insufficient documentation

## 2016-03-10 DIAGNOSIS — I2510 Atherosclerotic heart disease of native coronary artery without angina pectoris: Secondary | ICD-10-CM | POA: Insufficient documentation

## 2016-03-10 DIAGNOSIS — Z79899 Other long term (current) drug therapy: Secondary | ICD-10-CM | POA: Insufficient documentation

## 2016-03-10 DIAGNOSIS — E119 Type 2 diabetes mellitus without complications: Secondary | ICD-10-CM | POA: Insufficient documentation

## 2016-03-10 DIAGNOSIS — E785 Hyperlipidemia, unspecified: Secondary | ICD-10-CM | POA: Insufficient documentation

## 2016-03-10 DIAGNOSIS — R404 Transient alteration of awareness: Secondary | ICD-10-CM | POA: Diagnosis not present

## 2016-03-10 LAB — CBC WITH DIFFERENTIAL/PLATELET
Basophils Absolute: 0.1 K/uL (ref 0.0–0.1)
Basophils Relative: 1 %
Eosinophils Absolute: 0.2 K/uL (ref 0.0–0.7)
Eosinophils Relative: 3 %
HCT: 39.5 % (ref 36.0–46.0)
Hemoglobin: 12.8 g/dL (ref 12.0–15.0)
Lymphocytes Relative: 25 %
Lymphs Abs: 1.1 K/uL (ref 0.7–4.0)
MCH: 25.4 pg — ABNORMAL LOW (ref 26.0–34.0)
MCHC: 32.4 g/dL (ref 30.0–36.0)
MCV: 78.5 fL (ref 78.0–100.0)
Monocytes Absolute: 0.4 K/uL (ref 0.1–1.0)
Monocytes Relative: 10 %
Neutro Abs: 2.7 K/uL (ref 1.7–7.7)
Neutrophils Relative %: 61 %
Platelets: 145 K/uL — ABNORMAL LOW (ref 150–400)
RBC: 5.03 MIL/uL (ref 3.87–5.11)
RDW: 14.3 % (ref 11.5–15.5)
WBC: 4.4 K/uL (ref 4.0–10.5)

## 2016-03-10 LAB — COMPREHENSIVE METABOLIC PANEL
ALT: 15 U/L (ref 14–54)
ANION GAP: 7 (ref 5–15)
AST: 21 U/L (ref 15–41)
Albumin: 3.4 g/dL — ABNORMAL LOW (ref 3.5–5.0)
Alkaline Phosphatase: 104 U/L (ref 38–126)
BILIRUBIN TOTAL: 1.5 mg/dL — AB (ref 0.3–1.2)
BUN: 14 mg/dL (ref 6–20)
CO2: 23 mmol/L (ref 22–32)
Calcium: 8.8 mg/dL — ABNORMAL LOW (ref 8.9–10.3)
Chloride: 105 mmol/L (ref 101–111)
Creatinine, Ser: 0.71 mg/dL (ref 0.44–1.00)
GFR calc Af Amer: >60 mL/min (ref >60)
Glucose, Bld: 187 mg/dL — ABNORMAL HIGH (ref 65–99)
POTASSIUM: 4.2 mmol/L (ref 3.5–5.1)
Sodium: 135 mmol/L (ref 135–145)
TOTAL PROTEIN: 6.4 g/dL — AB (ref 6.5–8.1)

## 2016-03-10 LAB — URINALYSIS, ROUTINE W REFLEX MICROSCOPIC
Bilirubin Urine: NEGATIVE
Glucose, UA: NEGATIVE mg/dL
Hgb urine dipstick: NEGATIVE
KETONES UR: NEGATIVE mg/dL
LEUKOCYTES UA: NEGATIVE
NITRITE: NEGATIVE
PH: 6 (ref 5.0–8.0)
PROTEIN: NEGATIVE mg/dL
Specific Gravity, Urine: 1.025 (ref 1.005–1.030)

## 2016-03-10 LAB — TROPONIN I: Troponin I: <0.03 ng/mL

## 2016-03-10 LAB — CBG MONITORING, ED: Glucose-Capillary: 161 mg/dL — ABNORMAL HIGH (ref 65–99)

## 2016-03-10 LAB — AMMONIA: AMMONIA: 53 umol/L — AB (ref 9–35)

## 2016-03-10 NOTE — Discharge Instructions (Signed)
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 At this time [name] [name] does not meet admission criteria to the hospital. Please discuss in detail with Dr. Hilma Favors the problem with weakness, and falls. We are also arranging for the home health team to come out and do an assessment. Please have the discussion with your family as to whether long-term nursing placement might be an option. Please contact Dr. Hilma Favors if any changes, problems, or concerns. Weakness Weakness is a lack of strength. It may be felt all over the body (generalized) or in one specific part of the body (focal). Some causes of weakness can be serious. You may need further medical evaluation, especially if you are elderly or you have a history of immunosuppression (such as chemotherapy or HIV), kidney disease, heart disease, or diabetes. CAUSES  Weakness can be caused by many different things, including:  Infection.  Physical exhaustion.  Internal bleeding or other blood loss that results in a lack of red blood cells (anemia).  Dehydration. This cause is more common in elderly people.  Side effects or electrolyte abnormalities from medicines, such as pain medicines or sedatives.  Emotional distress, anxiety, or depression.  Circulation problems, especially severe peripheral arterial disease.  Heart disease, such as rapid atrial fibrillation, bradycardia, or heart failure.  Nervous system disorders, such as Guillain-Barr syndrome, multiple sclerosis, or stroke. DIAGNOSIS  To find the cause of your weakness, your caregiver will take your history and perform a physical exam. Lab tests or X-rays may also be ordered, if needed. TREATMENT  Treatment of weakness depends on the cause of your symptoms and can vary greatly. HOME CARE INSTRUCTIONS   Rest as needed.  Eat a well-balanced  diet.  Try to get some exercise every day.  Only take over-the-counter or prescription medicines as directed by your caregiver. SEEK MEDICAL CARE IF:   Your weakness seems to be getting worse or spreads to other parts of your body.  You develop new aches or pains. SEEK IMMEDIATE MEDICAL CARE IF:   You cannot perform your normal daily activities, such as getting dressed and feeding yourself.  You cannot walk up and down stairs, or you feel exhausted when you do so.  You have shortness of breath or chest pain.  You have difficulty moving parts of your body.  You have weakness in only one area of the body or on only one side of the body.  You have a fever.  You have trouble speaking or swallowing.  You cannot control your bladder or bowel movements.  You have black or bloody vomit or stools. MAKE SURE YOU:  Understand these instructions.  Will watch your condition.  Will get help right away if you are not doing well or get worse.   This information is not intended to replace advice given to you by your health care provider. Make sure you discuss any questions you have with your health care provider.   Document Released: 10/29/2005 Document Revised: 04/29/2012 Document Reviewed: 12/28/2011 Elsevier Interactive Patient Education [employer].

## 2016-03-10 NOTE — ED Notes (Signed)
Per EMS: Pt has had inc weakness x3 weeks, pt having trouble with ambulation.  Pt here today due to caregiver being unable to perform total care.  Pt has been on macrobid for three weeks and symptoms have progressed.  cbg 240, vitals WNL en route.

## 2016-03-10 NOTE — ED Provider Notes (Signed)
CSN: [id number]     Arrival date & time 03/10/16  1104 History   First MD Initiated Contact with Patient 03/10/16 1118     Chief Complaint  Patient presents with  . Weakness     (Consider location/radiation/quality/duration/timing/severity/associated sxs/prior Treatment) HPI Comments: Patient is a 75 year old female who presents to the emergency department with increasing weakness, and inability to ambulate.  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  The patient presents to the emergency department by EMS and with a relative. The history is obtained from the patient and her relative. The patient was seen in the emergency department approximately 3 weeks ago at which time she was admitted to the hospital because of elevated ammonia levels, weakness, urinary tract infection. The patient was discharged with in home physical therapy. The family noticed that shortly after arrival home from the hospital her weakness got progressively worse. And now the physical therapy team say that they can no longer ambulate the patient safely at home. The family members report that the patient has problems sitting up on her own, and she seems to be almost at the state she was in prior to her previous admission according to them. They present to the emergency department to have the patient reassessed because of her decline at this time.  Patient is a 75 y.o. female presenting with weakness. The history is provided by the patient.  Weakness This is a recurrent problem. Associated symptoms include fatigue and weakness.    Past Medical History  Diagnosis Date  . CAD in native artery   . Thrombocytopenia, unspecified ([place])   . Unspecified hereditary and idiopathic peripheral neuropathy   . Unspecified fall   . Other and unspecified hyperlipidemia   . Unspecified essential hypertension    . Obesity, unspecified   . Personal history of neurosis   . Intrinsic asthma, unspecified   . Allergic rhinitis, cause unspecified   . Cirrhosis of liver ([facility])     NASH, afp on 06/17/12 =3.7, per pt she had hep B vaccines in 1996, hep A in process.   . Colon adenomas 03/08/10    tcs by Dr. Gala Romney  . Hyperplastic polyps of stomach 03/08/10    tcs by Dr. Dudley Major  . Chronic gastritis 03/09/11    egd by Dr. Gala Romney  . History of hemorrhoids 03/08/10    tcs- internal and external  . Diverticula of colon 03/08/10    L side  . Esophagitis, erosive 03/08/10  . GERD (gastroesophageal reflux disease)   . Wears glasses   . Type II or unspecified type diabetes mellitus without mention of complication, not stated as uncontrolled   . Vertigo   . Pancreatitis   . Bilateral leg weakness   . Thrombocytopenia ([place]) 04/12/2014  . Pancytopenia, acquired ([place]) 04/12/2014  . Cirrhosis ([place])   . Abnormal EKG     hx of left anterior fasicular block on 04-09-13 ekg epic  . Chronic shoulder pain    Past Surgical History  Procedure Laterality Date  . Hysterectomy and btl      s/p  . Tumor excision  2003    rt arm and left foot  . Colonoscopy  03/08/10    Dr. [name]-->ext/int hemorrhoids, anal paipilla, rectal polyps, desc polyps, cecal polyp, left-sided diverticula. suboptiomal prep. next TCS 02/2015. multiple adenomas  . Esophagogastroduodenoscopy  03/08/10    Dr. [name] erosive RE, small  hh, antral erosions, small 1cm are of mucosal indentation along gastric body of doubtful significance, cystic nodularity of hypophyarynx, base of tongue, base of epiglottis. benign gastric biopsies  . Tubal ligation    . Esophagogastroduodenoscopy  10/08/2011    Dr. [name] esophageal varices, antral erosion. next egd 09/2013  . Foot surgery  2003    lt foot  . Eye surgery      cataracts bilateral  . Orif humerus fracture Right 02/17/2013    Procedure: OPEN REDUCTION INTERNAL FIXATION (ORIF) RIGHT PROXIMAL HUMERUS FRACTURE;   Surgeon: [name], MD;  Location: [place];  Service: Orthopedics;  Laterality: Right;  . Partial gastrectomy      family denies  . Esophagogastroduodenoscopy N/A 02/17/2014    Dr. Gala Romney: portal gastropathy, no varices. Due for surveillance 2017  . Breast surgery Right few yrs ago    benign area removed  . Abdominal hysterectomy    . Cardiac catheterization  02/25/2007    Dr. [name]:  normal LV systolic function, mild irregularities of LAD   Family History  Problem Relation Age of Onset  . Coronary artery disease      FH  . Diabetes      FH  . Heart disease Mother   . Colon cancer Neg Hx    Social History  Substance Use Topics  . Smoking status: Never Smoker   . Smokeless tobacco: Never Used  . Alcohol Use: No   OB History    No data available     Review of Systems  Constitutional: Positive for activity change and fatigue.  Neurological: Positive for tremors and weakness.  All other systems reviewed and are negative.     Allergies  Morphine and related and Compazine  Home Medications   Prior to Admission medications   Medication Sig Start Date End Date Taking? Authorizing Provider  ALPRAZolam Duanne Moron) 0.5 MG tablet Take 1 tablet (0.5 mg total) by mouth 3 (three) times daily as needed for anxiety. Patient taking differently: Take 0.5 mg by mouth 2 (two) times daily.  12/24/14  Yes [name], MD  aspirin EC 81 MG tablet Take 81 mg by mouth daily.   Yes Historical Provider, MD  budesonide-formoterol (SYMBICORT) 160-4.5 MCG/ACT inhaler Inhale 2 puffs into the lungs at bedtime.    Yes Historical Provider, MD  enalapril (VASOTEC) 5 MG tablet Take 1 tablet by mouth daily. 08/25/14  Yes Historical Provider, MD  ESTRACE VAGINAL 0.1 MG/GM vaginal cream Place 1 Applicatorful vaginally every other day. At  bedtime 08/11/13  Yes Historical Provider, MD  furosemide (LASIX) 20 MG tablet Take 1 tablet (20 mg total) by mouth every morning. Patient taking differently: Take 20  mg by mouth daily as needed for fluid.  09/03/14  Yes Mahala Menghini, PA-C  insulin aspart (NOVOLOG FLEXPEN) 100 UNIT/ML FlexPen Inject 10-16 Units into the skin 3 (three) times daily with meals. Patient taking differently: Inject 0-15 Units into the skin 3 (three) times daily with meals.  10/10/15  Yes Cassandria Anger, MD  Insulin Detemir (LEVEMIR FLEXTOUCH) 100 UNIT/ML Pen Inject 50 Units into the skin daily at 10 pm. 01/18/16  Yes Cassandria Anger, MD  lactulose (CHRONULAC) 10 GM/15ML solution Take 10 g by mouth 4 (four) times daily.  10/20/14  Yes Historical Provider, MD  Multiple Vitamins-Minerals (CENTRUM SILVER PO) Take 1 tablet by mouth daily.    Yes Historical Provider, MD  NEXIUM 40 MG capsule Take 40 mg by mouth every morning.  06/17/14  Yes Historical Provider, MD  nitrofurantoin, macrocrystal-monohydrate, (MACROBID) 100 MG capsule Take 1 capsule by mouth 2 (two) times daily. For 7 days. Starting on 4/24 03/05/16  Yes Historical Provider, MD  pregabalin (LYRICA) 100 MG capsule Take 100 mg by mouth 2 (two) times daily.   Yes Historical Provider, MD  rifaximin (XIFAXAN) 550 MG TABS tablet Take 1 tablet (550 mg total) by mouth 2 (two) times daily. 06/28/14  Yes Kathie Dike, MD  spironolactone (ALDACTONE) 50 MG tablet Take 50 mg by mouth daily.   Yes Historical Provider, MD  triamcinolone cream (KENALOG) 0.1 % Apply 1 application topically daily as needed (for irritation).  06/26/13  Yes Historical Provider, MD  ursodiol (URSO FORTE) 500 MG tablet Take 500 mg by mouth 2 (two) times daily.   Yes Historical Provider, MD   BP 110/70 mmHg  Pulse 80  Temp(Src) 97.6 F (36.4 C) (Oral)  Resp 20  Ht 5\' 8"  (1.727 m)  Wt 109.77 kg  BMI 36.80 kg/m2  SpO2 95% Physical Exam  Constitutional: She is oriented to person, place, and time. She appears well-developed and well-nourished.  Non-toxic appearance.  HENT:  Head: Normocephalic.  Right Ear: Tympanic membrane and external ear normal.   Left Ear: Tympanic membrane and external ear normal.  Airway patent.  Eyes: EOM and lids are normal. Pupils are equal, round, and reactive to light.  Neck: Normal range of motion. Neck supple. Carotid bruit is not present.  Cardiovascular: Normal rate, regular rhythm, normal heart sounds, intact distal pulses and normal pulses.   Pulmonary/Chest: Effort normal and breath sounds normal. No respiratory distress.  Abdominal: Soft. Bowel sounds are normal. There is no tenderness. There is no guarding.  Musculoskeletal: Normal range of motion.  There is 1+ edema of the lower extremities. There is weakness of the right upper extremity due to previous fracture. Radial pulses are 2+. Dorsalis pedis pulses are 2+. Capillary refill is less than 2 seconds.  Lymphadenopathy:       Head (right side): No submandibular adenopathy present.       Head (left side): No submandibular adenopathy present.    She has no cervical adenopathy.  Neurological: She is alert and oriented to person, place, and time. She has normal strength. She displays tremor. No sensory deficit. She exhibits normal muscle tone.  There is weakness noted of the upper extremities. The family reports the patient has had weakness of the right upper extremity in particular since previous fracture. The patient can raise the right and left leg only about 2-3 inches off the bed. The patient cannot sit up. The patient cannot support her weight when attempting to stand.  Skin: Skin is warm and dry.  Psychiatric: She has a normal mood and affect. Her speech is normal.  Nursing note and vitals reviewed.   ED Course  Procedures (including critical care time) Labs Review Labs Reviewed  COMPREHENSIVE METABOLIC PANEL  TROPONIN I  CBC WITH DIFFERENTIAL/PLATELET  URINALYSIS, ROUTINE W REFLEX MICROSCOPIC (NOT AT [facility])  AMMONIA  CBG MONITORING, ED    Imaging Review No results found. I have personally reviewed and evaluated these images and lab  results as part of my medical decision-making.   EKG Interpretation None      MDM  Pulse oximetry is 95-98% on room air. Vital signs are essentially within normal limits. Urinalysis is within normal limits. Comprehensive metabolic panel reveals a glucose of 187 that is elevated, a low albumin of 3.4, and an elevated  total bilirubin 1.5. The anion gap is within normal limits at 7. Troponin is negative for acute event is less than 0.03. The complete blood count shows the platelets to be slightly low at 145, but this is in the patient's usual range. The ammonia level is slightly elevated at 53.  Reassessment reveals the patient to be awake and alert. She continues to have difficulty with sitting up, she cannot bear weight to stand. Review of the records indicate the patient had a CT scan approximately 17 days ago when she was admitted to the hospital ` there were no acute bleeds, no mass, or other emergent changes. There was noted some chronic ischemic changes present. The findings were essentially unchanged from CT scan done in 2015.  Call placed to the hospitalist for admission, as the patient is unable to perform activities of daily living, and the family state that the small amount of progress that was gained at the time of hospital admission has now been lost.   Hospitalist suggested chest x-ray, and CT head scan. The chest x-ray is negative for any acute problem or event.     CT scan of the head reveals intracranial atherosclerosis, but no midline shift, no hemorrhage, or acute problem. CT scan of the cervical spine reveals a disc space narrowing throughout the cervical spine. There is mild central canal narrowing present at the C5-C6, and C6-C7 levels. There is moderate to advanced neuronal foraminal narrowing particularly at the C5-C6 area. No other acute abnormality.  The chest x-ray and CT scans were discussed with the hospitalist Dr. Caryn Section. The patient currently does not meet criteria  for hospital admission. Dr. Caryn Section will have the nurse manager to contact the case manager, to assess resources that may be available to the family over the weekend. Dr. Sabra Heck and I discussed with the patient the importance of discussing these changes with Dr. Armandina Gemma, or certain number of hours already, but these are not enough for the patient's safety according to the family. Pt will be discharged home.      Final diagnoses:  None    **I have reviewed nursing notes, vital signs, and all appropriate lab and imaging results for this patient.    Lily Kocher, PA-C 03/10/16 1622  Noemi Chapel, MD 03/11/16 [phone]

## 2016-03-12 DIAGNOSIS — N390 Urinary tract infection, site not specified: Secondary | ICD-10-CM | POA: Diagnosis not present

## 2016-03-12 DIAGNOSIS — E7220 Disorder of urea cycle metabolism, unspecified: Secondary | ICD-10-CM | POA: Diagnosis not present

## 2016-03-12 DIAGNOSIS — L089 Local infection of the skin and subcutaneous tissue, unspecified: Secondary | ICD-10-CM | POA: Diagnosis not present

## 2016-03-12 DIAGNOSIS — K8020 Calculus of gallbladder without cholecystitis without obstruction: Secondary | ICD-10-CM | POA: Diagnosis not present

## 2016-03-12 DIAGNOSIS — K7460 Unspecified cirrhosis of liver: Secondary | ICD-10-CM | POA: Diagnosis not present

## 2016-03-12 DIAGNOSIS — I2510 Atherosclerotic heart disease of native coronary artery without angina pectoris: Secondary | ICD-10-CM | POA: Diagnosis not present

## 2016-03-12 DIAGNOSIS — E1142 Type 2 diabetes mellitus with diabetic polyneuropathy: Secondary | ICD-10-CM | POA: Diagnosis not present

## 2016-03-13 DIAGNOSIS — N390 Urinary tract infection, site not specified: Secondary | ICD-10-CM | POA: Diagnosis not present

## 2016-03-13 DIAGNOSIS — E7220 Disorder of urea cycle metabolism, unspecified: Secondary | ICD-10-CM | POA: Diagnosis not present

## 2016-03-13 DIAGNOSIS — L089 Local infection of the skin and subcutaneous tissue, unspecified: Secondary | ICD-10-CM | POA: Diagnosis not present

## 2016-03-13 DIAGNOSIS — K7460 Unspecified cirrhosis of liver: Secondary | ICD-10-CM | POA: Diagnosis not present

## 2016-03-13 DIAGNOSIS — I2510 Atherosclerotic heart disease of native coronary artery without angina pectoris: Secondary | ICD-10-CM | POA: Diagnosis not present

## 2016-03-13 DIAGNOSIS — K8020 Calculus of gallbladder without cholecystitis without obstruction: Secondary | ICD-10-CM | POA: Diagnosis not present

## 2016-03-13 DIAGNOSIS — E1142 Type 2 diabetes mellitus with diabetic polyneuropathy: Secondary | ICD-10-CM | POA: Diagnosis not present

## 2016-03-16 DIAGNOSIS — E7220 Disorder of urea cycle metabolism, unspecified: Secondary | ICD-10-CM | POA: Diagnosis not present

## 2016-03-16 DIAGNOSIS — L089 Local infection of the skin and subcutaneous tissue, unspecified: Secondary | ICD-10-CM | POA: Diagnosis not present

## 2016-03-16 DIAGNOSIS — K8020 Calculus of gallbladder without cholecystitis without obstruction: Secondary | ICD-10-CM | POA: Diagnosis not present

## 2016-03-16 DIAGNOSIS — E1142 Type 2 diabetes mellitus with diabetic polyneuropathy: Secondary | ICD-10-CM | POA: Diagnosis not present

## 2016-03-16 DIAGNOSIS — K7460 Unspecified cirrhosis of liver: Secondary | ICD-10-CM | POA: Diagnosis not present

## 2016-03-16 DIAGNOSIS — I2510 Atherosclerotic heart disease of native coronary artery without angina pectoris: Secondary | ICD-10-CM | POA: Diagnosis not present

## 2016-03-16 DIAGNOSIS — N390 Urinary tract infection, site not specified: Secondary | ICD-10-CM | POA: Diagnosis not present

## 2016-03-19 ENCOUNTER — Other Ambulatory Visit: Payer: Self-pay | Admitting: Gastroenterology

## 2016-03-19 DIAGNOSIS — I2510 Atherosclerotic heart disease of native coronary artery without angina pectoris: Secondary | ICD-10-CM | POA: Diagnosis not present

## 2016-03-19 DIAGNOSIS — N390 Urinary tract infection, site not specified: Secondary | ICD-10-CM | POA: Diagnosis not present

## 2016-03-19 DIAGNOSIS — E1142 Type 2 diabetes mellitus with diabetic polyneuropathy: Secondary | ICD-10-CM | POA: Diagnosis not present

## 2016-03-19 DIAGNOSIS — L089 Local infection of the skin and subcutaneous tissue, unspecified: Secondary | ICD-10-CM | POA: Diagnosis not present

## 2016-03-19 DIAGNOSIS — K8020 Calculus of gallbladder without cholecystitis without obstruction: Secondary | ICD-10-CM | POA: Diagnosis not present

## 2016-03-19 DIAGNOSIS — E7220 Disorder of urea cycle metabolism, unspecified: Secondary | ICD-10-CM | POA: Diagnosis not present

## 2016-03-19 DIAGNOSIS — K7460 Unspecified cirrhosis of liver: Secondary | ICD-10-CM | POA: Diagnosis not present

## 2016-03-23 DIAGNOSIS — L089 Local infection of the skin and subcutaneous tissue, unspecified: Secondary | ICD-10-CM | POA: Diagnosis not present

## 2016-03-23 DIAGNOSIS — E1142 Type 2 diabetes mellitus with diabetic polyneuropathy: Secondary | ICD-10-CM | POA: Diagnosis not present

## 2016-03-23 DIAGNOSIS — K7460 Unspecified cirrhosis of liver: Secondary | ICD-10-CM | POA: Diagnosis not present

## 2016-03-23 DIAGNOSIS — I2510 Atherosclerotic heart disease of native coronary artery without angina pectoris: Secondary | ICD-10-CM | POA: Diagnosis not present

## 2016-03-23 DIAGNOSIS — E7220 Disorder of urea cycle metabolism, unspecified: Secondary | ICD-10-CM | POA: Diagnosis not present

## 2016-03-23 DIAGNOSIS — K8020 Calculus of gallbladder without cholecystitis without obstruction: Secondary | ICD-10-CM | POA: Diagnosis not present

## 2016-03-23 DIAGNOSIS — N390 Urinary tract infection, site not specified: Secondary | ICD-10-CM | POA: Diagnosis not present

## 2016-03-26 ENCOUNTER — Emergency Department (HOSPITAL_COMMUNITY): Payer: Medicare Other

## 2016-03-26 ENCOUNTER — Emergency Department (HOSPITAL_COMMUNITY)
Admission: EM | Admit: 2016-03-26 | Discharge: 2016-03-26 | Disposition: A | Discharge status: Home / Self Care | Payer: Medicare Other | Attending: Emergency Medicine | Admitting: Emergency Medicine

## 2016-03-26 ENCOUNTER — Encounter (HOSPITAL_COMMUNITY): Payer: Self-pay

## 2016-03-26 DIAGNOSIS — E785 Hyperlipidemia, unspecified: Secondary | ICD-10-CM | POA: Diagnosis not present

## 2016-03-26 DIAGNOSIS — N390 Urinary tract infection, site not specified: Secondary | ICD-10-CM | POA: Insufficient documentation

## 2016-03-26 DIAGNOSIS — Z794 Long term (current) use of insulin: Secondary | ICD-10-CM | POA: Insufficient documentation

## 2016-03-26 DIAGNOSIS — Z7982 Long term (current) use of aspirin: Secondary | ICD-10-CM | POA: Diagnosis not present

## 2016-03-26 DIAGNOSIS — R531 Weakness: Secondary | ICD-10-CM | POA: Diagnosis not present

## 2016-03-26 DIAGNOSIS — I2510 Atherosclerotic heart disease of native coronary artery without angina pectoris: Secondary | ICD-10-CM | POA: Diagnosis not present

## 2016-03-26 DIAGNOSIS — I6782 Cerebral ischemia: Secondary | ICD-10-CM | POA: Diagnosis not present

## 2016-03-26 DIAGNOSIS — E119 Type 2 diabetes mellitus without complications: Secondary | ICD-10-CM | POA: Insufficient documentation

## 2016-03-26 DIAGNOSIS — Z1389 Encounter for screening for other disorder: Secondary | ICD-10-CM | POA: Diagnosis not present

## 2016-03-26 DIAGNOSIS — I10 Essential (primary) hypertension: Secondary | ICD-10-CM | POA: Insufficient documentation

## 2016-03-26 DIAGNOSIS — Z79891 Long term (current) use of opiate analgesic: Secondary | ICD-10-CM | POA: Insufficient documentation

## 2016-03-26 DIAGNOSIS — R5383 Other fatigue: Secondary | ICD-10-CM | POA: Diagnosis not present

## 2016-03-26 DIAGNOSIS — G9340 Encephalopathy, unspecified: Secondary | ICD-10-CM | POA: Diagnosis not present

## 2016-03-26 DIAGNOSIS — E669 Obesity, unspecified: Secondary | ICD-10-CM | POA: Insufficient documentation

## 2016-03-26 DIAGNOSIS — J45909 Unspecified asthma, uncomplicated: Secondary | ICD-10-CM | POA: Insufficient documentation

## 2016-03-26 LAB — COMPREHENSIVE METABOLIC PANEL
ALBUMIN: 3.3 g/dL — AB (ref 3.5–5.0)
ALK PHOS: 105 U/L (ref 38–126)
ALT: 16 U/L (ref 14–54)
AST: 22 U/L (ref 15–41)
Anion gap: 5 (ref 5–15)
BUN: 15 mg/dL (ref 6–20)
CALCIUM: 8.7 mg/dL — AB (ref 8.9–10.3)
CO2: 26 mmol/L (ref 22–32)
CREATININE: 0.7 mg/dL (ref 0.44–1.00)
Chloride: 106 mmol/L (ref 101–111)
Glucose, Bld: 170 mg/dL — ABNORMAL HIGH (ref 65–99)
Potassium: 3.8 mmol/L (ref 3.5–5.1)
Sodium: 137 mmol/L (ref 135–145)
Total Bilirubin: 1.1 mg/dL (ref 0.3–1.2)
Total Protein: 6.2 g/dL — ABNORMAL LOW (ref 6.5–8.1)

## 2016-03-26 LAB — CBC WITH DIFFERENTIAL/PLATELET
BASOS ABS: 0 10*3/uL (ref 0.0–0.1)
BASOS PCT: 1 %
Eosinophils Absolute: 0.1 10*3/uL (ref 0.0–0.7)
Eosinophils Relative: 2 %
HEMATOCRIT: 39.5 % (ref 36.0–46.0)
HEMOGLOBIN: 12.7 g/dL (ref 12.0–15.0)
LYMPHS PCT: 25 %
Lymphs Abs: 1.1 10*3/uL (ref 0.7–4.0)
MCH: 25.8 pg — ABNORMAL LOW (ref 26.0–34.0)
MCHC: 32.2 g/dL (ref 30.0–36.0)
MCV: 80.3 fL (ref 78.0–100.0)
Monocytes Absolute: 0.3 10*3/uL (ref 0.1–1.0)
Monocytes Relative: 7 %
NEUTROS ABS: 2.8 10*3/uL (ref 1.7–7.7)
NEUTROS PCT: 65 %
Platelets: 157 10*3/uL (ref 150–400)
RBC: 4.92 MIL/uL (ref 3.87–5.11)
RDW: 14.4 % (ref 11.5–15.5)
WBC: 4.4 10*3/uL (ref 4.0–10.5)

## 2016-03-26 LAB — URINE MICROSCOPIC-ADD ON

## 2016-03-26 LAB — URINALYSIS, ROUTINE W REFLEX MICROSCOPIC
BILIRUBIN URINE: NEGATIVE
Glucose, UA: 100 mg/dL — AB
KETONES UR: NEGATIVE mg/dL
NITRITE: POSITIVE — AB
Protein, ur: 30 mg/dL — AB
Specific Gravity, Urine: >1.03 — ABNORMAL HIGH (ref 1.005–1.030)
pH: 5.5 (ref 5.0–8.0)

## 2016-03-26 LAB — BRAIN NATRIURETIC PEPTIDE: B NATRIURETIC PEPTIDE 5: 59 pg/mL (ref 0.0–100.0)

## 2016-03-26 LAB — AMMONIA: AMMONIA: 59 umol/L — AB (ref 9–35)

## 2016-03-26 MED ORDER — CEPHALEXIN 500 MG PO CAPS
500.0000 mg | ORAL_CAPSULE | Freq: Four times a day (QID) | ORAL | Status: DC
Start: 2016-03-26 — End: 2016-08-06

## 2016-03-26 MED ORDER — CEPHALEXIN 500 MG PO CAPS
500.0000 mg | ORAL_CAPSULE | Freq: Once | ORAL | Status: AC
  Administered 2016-03-26: 500 mg via ORAL
  Filled 2016-03-26: qty 1

## 2016-03-26 NOTE — ED Notes (Signed)
Urine sample obtained. Pt cleaned and repositioned. Family at bedside.

## 2016-03-26 NOTE — Discharge Instructions (Signed)
Follow up with your md in 3-4 days °

## 2016-03-26 NOTE — ED Notes (Signed)
Pt is unable to stand.

## 2016-03-26 NOTE — ED Notes (Signed)
Pt sent over from [place] due to weakness. Pt reports weakness has been increasing over the last 2 weeks and has gotten worse. Unable to stand. Family reports sleeping more.

## 2016-03-26 NOTE — ED Provider Notes (Signed)
CSN: [id number]     Arrival date & time 03/26/16  1407 History   First MD Initiated Contact with Patient 03/26/16 1543     Chief Complaint  Patient presents with  . Weakness     (Consider location/radiation/quality/duration/timing/severity/associated sxs/prior Treatment) Patient is a 75 y.o. female presenting with weakness. The history is provided by the patient (The patient complains of general weakness).  Weakness This is a new problem. The current episode started more than 2 days ago. The problem occurs constantly. The problem has not changed since onset.Pertinent negatives include no chest pain, no abdominal pain and no headaches. Nothing aggravates the symptoms. Nothing relieves the symptoms.    Past Medical History  Diagnosis Date  . CAD in native artery   . Thrombocytopenia, unspecified ([place])   . Unspecified hereditary and idiopathic peripheral neuropathy   . Unspecified fall   . Other and unspecified hyperlipidemia   . Unspecified essential hypertension   . Obesity, unspecified   . Personal history of neurosis   . Intrinsic asthma, unspecified   . Allergic rhinitis, cause unspecified   . Cirrhosis of liver ([facility])     NASH, afp on 06/17/12 =3.7, per pt she had hep B vaccines in 1996, hep A in process.   . Colon adenomas 03/08/10    tcs by Dr. Gala Romney  . Hyperplastic polyps of stomach 03/08/10    tcs by Dr. Dudley Major  . Chronic gastritis 03/09/11    egd by Dr. Gala Romney  . History of hemorrhoids 03/08/10    tcs- internal and external  . Diverticula of colon 03/08/10    L side  . Esophagitis, erosive 03/08/10  . GERD (gastroesophageal reflux disease)   . Wears glasses   . Type II or unspecified type diabetes mellitus without mention of complication, not stated as uncontrolled   . Vertigo   . Pancreatitis   . Bilateral leg weakness   . Thrombocytopenia ([place]) 04/12/2014  . Pancytopenia, acquired ([place]) 04/12/2014  . Cirrhosis ([place])   . Abnormal EKG     hx of left anterior fasicular block  on 04-09-13 ekg epic  . Chronic shoulder pain    Past Surgical History  Procedure Laterality Date  . Hysterectomy and btl      s/p  . Tumor excision  2003    rt arm and left foot  . Colonoscopy  03/08/10    Dr. [name]-->ext/int hemorrhoids, anal paipilla, rectal polyps, desc polyps, cecal polyp, left-sided diverticula. suboptiomal prep. next TCS 02/2015. multiple adenomas  . Esophagogastroduodenoscopy  03/08/10    Dr. [name] erosive RE, small hh, antral erosions, small 1cm are of mucosal indentation along gastric body of doubtful significance, cystic nodularity of hypophyarynx, base of tongue, base of epiglottis. benign gastric biopsies  . Tubal ligation    . Esophagogastroduodenoscopy  10/08/2011    Dr. [name] esophageal varices, antral erosion. next egd 09/2013  . Foot surgery  2003    lt foot  . Eye surgery      cataracts bilateral  . Orif humerus fracture Right 02/17/2013    Procedure: OPEN REDUCTION INTERNAL FIXATION (ORIF) RIGHT PROXIMAL HUMERUS FRACTURE;  Surgeon: [name], MD;  Location: [facility];  Service: Orthopedics;  Laterality: Right;  . Partial gastrectomy      family denies  . Esophagogastroduodenoscopy N/A 02/17/2014    Dr. Gala Romney: portal gastropathy, no varices. Due for surveillance 2017  . Breast surgery Right few yrs ago    benign area removed  . Abdominal hysterectomy    .  Cardiac catheterization  02/25/2007    Dr. [name]:  normal LV systolic function, mild irregularities of LAD   Family History  Problem Relation Age of Onset  . Coronary artery disease      FH  . Diabetes      FH  . Heart disease Mother   . Colon cancer Neg Hx    Social History  Substance Use Topics  . Smoking status: Never Smoker   . Smokeless tobacco: Never Used  . Alcohol Use: No   OB History    No data available     Review of Systems  Constitutional: Negative for appetite change and fatigue.  HENT: Negative for congestion, ear discharge and sinus pressure.   Eyes:  Negative for discharge.  Respiratory: Negative for cough.   Cardiovascular: Negative for chest pain.  Gastrointestinal: Negative for abdominal pain and diarrhea.  Genitourinary: Negative for frequency and hematuria.  Musculoskeletal: Negative for back pain.       Edema in lower legs  Skin: Negative for rash.  Neurological: Positive for weakness. Negative for seizures and headaches.  Psychiatric/Behavioral: Negative for hallucinations.      Allergies  Morphine and related and Compazine  Home Medications   Prior to Admission medications   Medication Sig Start Date End Date Taking? Authorizing Provider  ALPRAZolam Duanne Moron) 0.5 MG tablet Take 1 tablet (0.5 mg total) by mouth 3 (three) times daily as needed for anxiety. Patient taking differently: Take 0.5 mg by mouth 2 (two) times daily.  12/24/14  Yes [name], MD  aspirin EC 81 MG tablet Take 81 mg by mouth daily.   Yes Historical Provider, MD  budesonide-formoterol (SYMBICORT) 160-4.5 MCG/ACT inhaler Inhale 2 puffs into the lungs at bedtime.    Yes Historical Provider, MD  enalapril (VASOTEC) 5 MG tablet Take 1 tablet by mouth daily. 08/25/14  Yes Historical Provider, MD  ESTRACE VAGINAL 0.1 MG/GM vaginal cream Place 1 Applicatorful vaginally every other day. At  bedtime 08/11/13  Yes Historical Provider, MD  furosemide (LASIX) 20 MG tablet Take 1 tablet (20 mg total) by mouth every morning. Patient taking differently: Take 20 mg by mouth daily as needed for fluid.  09/03/14  Yes Mahala Menghini, PA-C  insulin aspart (NOVOLOG FLEXPEN) 100 UNIT/ML FlexPen Inject 10-16 Units into the skin 3 (three) times daily with meals. Patient taking differently: Inject 0-15 Units into the skin 3 (three) times daily with meals.  10/10/15  Yes Cassandria Anger, MD  Insulin Detemir (LEVEMIR FLEXTOUCH) 100 UNIT/ML Pen Inject 50 Units into the skin daily at 10 pm. 01/18/16  Yes Cassandria Anger, MD  lactulose (CHRONULAC) 10 GM/15ML solution  Take 10 g by mouth 4 (four) times daily.  10/20/14  Yes Historical Provider, MD  Multiple Vitamins-Minerals (CENTRUM SILVER PO) Take 1 tablet by mouth daily.    Yes Historical Provider, MD  NEXIUM 40 MG capsule Take 40 mg by mouth every morning.  06/17/14  Yes Historical Provider, MD  oxyCODONE (OXY IR/ROXICODONE) 5 MG immediate release tablet Take 5 mg by mouth 2 (two) times daily as needed for moderate pain or severe pain.  03/16/16  Yes Historical Provider, MD  pregabalin (LYRICA) 100 MG capsule Take 100 mg by mouth 2 (two) times daily.   Yes Historical Provider, MD  rifaximin (XIFAXAN) 550 MG TABS tablet Take 1 tablet (550 mg total) by mouth 2 (two) times daily. 06/28/14  Yes Kathie Dike, MD  spironolactone (ALDACTONE) 50 MG tablet TAKE ONE TABLET  BY MOUTH ONCE DAILY IN THE MORNING 03/19/16  Yes Mahala Menghini, PA-C  triamcinolone cream (KENALOG) 0.1 % Apply 1 application topically daily as needed (for irritation).  06/26/13  Yes Historical Provider, MD  ursodiol (URSO FORTE) 500 MG tablet Take 500 mg by mouth 2 (two) times daily.   Yes Historical Provider, MD  cephALEXin (KEFLEX) 500 MG capsule Take 1 capsule (500 mg total) by mouth 4 (four) times daily. 03/26/16   Milton Ferguson, MD   BP 121/61 mmHg  Pulse 70  Temp(Src) 98.2 F (36.8 C) (Oral)  Resp 19  SpO2 95% Physical Exam  Constitutional: She is oriented to person, place, and time. She appears well-developed.  HENT:  Head: Normocephalic.  Eyes: Conjunctivae and EOM are normal. No scleral icterus.  Neck: Neck supple. No thyromegaly present.  Cardiovascular: Normal rate and regular rhythm.  Exam reveals no gallop and no friction rub.   No murmur heard. Pulmonary/Chest: No stridor. She has no wheezes. She has no rales. She exhibits no tenderness.  Abdominal: She exhibits no distension. There is no tenderness. There is no rebound.  Musculoskeletal: Normal range of motion. She exhibits edema.  2+ edema in both lower legs  Lymphadenopathy:     She has no cervical adenopathy.  Neurological: She is oriented to person, place, and time. She exhibits normal muscle tone. Coordination normal.  Skin: No rash noted. No erythema.  Psychiatric: She has a normal mood and affect. Her behavior is normal.    ED Course  Procedures (including critical care time) Labs Review Labs Reviewed  CBC WITH DIFFERENTIAL/PLATELET - Abnormal; Notable for the following:    MCH 25.8 (*)    All other components within normal limits  COMPREHENSIVE METABOLIC PANEL - Abnormal; Notable for the following:    Glucose, Bld 170 (*)    Calcium 8.7 (*)    Total Protein 6.2 (*)    Albumin 3.3 (*)    All other components within normal limits  URINALYSIS, ROUTINE W REFLEX MICROSCOPIC (NOT AT [facility]) - Abnormal; Notable for the following:    Color, Urine AMBER (*)    Specific Gravity, Urine >1.030 (*)    Glucose, UA 100 (*)    Hgb urine dipstick MODERATE (*)    Protein, ur 30 (*)    Nitrite POSITIVE (*)    Leukocytes, UA SMALL (*)    All other components within normal limits  AMMONIA - Abnormal; Notable for the following:    Ammonia 59 (*)    All other components within normal limits  URINE MICROSCOPIC-ADD ON - Abnormal; Notable for the following:    Squamous Epithelial / LPF 6-30 (*)    Bacteria, UA MANY (*)    All other components within normal limits  URINE CULTURE  BRAIN NATRIURETIC PEPTIDE    Imaging Review Dg Chest 2 View  03/26/2016  CLINICAL DATA:  Weakness. EXAM: CHEST  2 VIEW COMPARISON:  03/10/2016 FINDINGS: Cardiomediastinal silhouette is enlarged. Mediastinal contours appear intact. There is no evidence of focal airspace consolidation, pleural effusion or pneumothorax. Osseous structures are without acute abnormality. Partially visualized right humeral side plate and screw fixation is noted. Soft tissues are grossly normal. IMPRESSION: Enlarged cardiac silhouette. No evidence of focal airspace consolidation or pulmonary edema. Electronically  Signed   By: Fidela Salisbury M.D.   On: 03/26/2016 16:30   Ct Head Wo Contrast  03/26/2016  CLINICAL DATA:  Increasing weakness x2 weeks EXAM: CT HEAD WITHOUT CONTRAST TECHNIQUE: Contiguous axial images were obtained  from the base of the skull through the vertex without intravenous contrast. COMPARISON:  03/10/2016 FINDINGS: No evidence of parenchymal hemorrhage or extra-axial fluid collection. No mass lesion, mass effect, or midline shift. No CT evidence of acute infarction. Subcortical white matter and periventricular small vessel ischemic changes. Right basal ganglia calcifications. Global cortical atrophy.  No ventriculomegaly. The visualized paranasal sinuses are essentially clear. The mastoid air cells are unopacified. No evidence of calvarial fracture. IMPRESSION: No evidence of acute intracranial abnormality. Atrophy with small vessel ischemic changes. Electronically Signed   By: Julian Hy M.D.   On: 03/26/2016 16:33   I have personally reviewed and evaluated these images and lab results as part of my medical decision-making.   EKG Interpretation None      MDM   Final diagnoses:  UTI (lower urinary tract infection)  Patient has a urinary tract infection will be placed on Keflex and will follow-up with her PCP    Milton Ferguson, MD 03/26/16 [phone]

## 2016-03-29 LAB — URINE CULTURE: Culture: >=100000 — AB

## 2016-03-30 ENCOUNTER — Telehealth: Payer: Self-pay | Admitting: *Deleted

## 2016-03-30 NOTE — ED Notes (Signed)
Post ED Visit - Positive Culture Follow-up: Successful Patient Follow-Up  Culture assessed and recommendations reviewed by: []  Elenor Quinones, Pharm.D. []  Heide Guile, Pharm.D., BCPS []  Parks Neptune, Pharm.D. [x]  Alycia Rossetti, Pharm.D., BCPS []  [place], Pharm.D., BCPS, AAHIVP []  Legrand Como, Pharm.D., BCPS, AAHIVP []  Milus Glazier, Pharm.D. []  Stephens November, Pharm.D.  Positive urine culture  []  Patient discharged without antimicrobial prescription and treatment is now indicated [x]  Organism is resistant to prescribed ED discharge antimicrobial []  Patient with positive blood cultures  Changes discussed with ED provider: Quincy Carnes, PA-C New antibiotic prescription Bactrim DS 1 tab PO BID x 7 days Called to [facility] [id number]  Contacted patient, date 03/30/2016, time [place], [place] 03/30/2016, 9:52 AM

## 2016-03-30 NOTE — Progress Notes (Signed)
ED Antimicrobial Stewardship Positive Culture Follow Up   [name] [name] is an 75 y.o. female who presented to [facility] on 03/26/2016 with a chief complaint of  Chief Complaint  Patient presents with  . Weakness    Recent Results (from the past 720 hour(s))  Urine culture     Status: Abnormal   Collection Time: 03/26/16  5:00 PM  Result Value Ref Range Status   Specimen Description URINE, CLEAN CATCH  Final   Special Requests NONE  Final   Culture >=100,000 COLONIES/mL ENTEROBACTER AEROGENES (A)  Final   Report Status 03/29/2016 FINAL  Final   Organism ID, Bacteria ENTEROBACTER AEROGENES (A)  Final      Susceptibility   Enterobacter aerogenes - MIC*    CEFAZOLIN >=64 RESISTANT Resistant     CEFTRIAXONE <=1 SENSITIVE Sensitive     CIPROFLOXACIN <=0.25 SENSITIVE Sensitive     GENTAMICIN <=1 SENSITIVE Sensitive     IMIPENEM 2 SENSITIVE Sensitive     NITROFURANTOIN 64 INTERMEDIATE Intermediate     TRIMETH/SULFA <=20 SENSITIVE Sensitive     PIP/TAZO <=4 SENSITIVE Sensitive     * >=100,000 COLONIES/mL ENTEROBACTER AEROGENES    [x]  Treated with Keflex, organism resistant to prescribed antimicrobial  New antibiotic prescription: Bactrim DS 1 tab bid x 7 days  ED Provider: Quincy Carnes, PA-C  [name] [name] 03/30/2016, 7:54 AM Infectious Diseases Pharmacist Phone# [phone]

## 2016-04-03 DIAGNOSIS — I10 Essential (primary) hypertension: Secondary | ICD-10-CM | POA: Diagnosis not present

## 2016-04-03 DIAGNOSIS — I2510 Atherosclerotic heart disease of native coronary artery without angina pectoris: Secondary | ICD-10-CM | POA: Diagnosis not present

## 2016-04-03 DIAGNOSIS — J45909 Unspecified asthma, uncomplicated: Secondary | ICD-10-CM | POA: Diagnosis not present

## 2016-04-03 DIAGNOSIS — E1142 Type 2 diabetes mellitus with diabetic polyneuropathy: Secondary | ICD-10-CM | POA: Diagnosis not present

## 2016-04-03 DIAGNOSIS — K7460 Unspecified cirrhosis of liver: Secondary | ICD-10-CM | POA: Diagnosis not present

## 2016-04-11 ENCOUNTER — Other Ambulatory Visit: Payer: Self-pay | Admitting: "Endocrinology

## 2016-04-13 ENCOUNTER — Other Ambulatory Visit: Payer: Self-pay | Admitting: "Endocrinology

## 2016-04-13 DIAGNOSIS — E1142 Type 2 diabetes mellitus with diabetic polyneuropathy: Secondary | ICD-10-CM | POA: Diagnosis not present

## 2016-04-13 DIAGNOSIS — I10 Essential (primary) hypertension: Secondary | ICD-10-CM | POA: Diagnosis not present

## 2016-04-13 DIAGNOSIS — E7220 Disorder of urea cycle metabolism, unspecified: Secondary | ICD-10-CM | POA: Diagnosis not present

## 2016-04-13 DIAGNOSIS — K7460 Unspecified cirrhosis of liver: Secondary | ICD-10-CM | POA: Diagnosis not present

## 2016-04-13 DIAGNOSIS — N390 Urinary tract infection, site not specified: Secondary | ICD-10-CM | POA: Diagnosis not present

## 2016-04-17 ENCOUNTER — Other Ambulatory Visit: Payer: Self-pay | Admitting: "Endocrinology

## 2016-04-17 DIAGNOSIS — E1142 Type 2 diabetes mellitus with diabetic polyneuropathy: Secondary | ICD-10-CM | POA: Diagnosis not present

## 2016-04-17 DIAGNOSIS — E1165 Type 2 diabetes mellitus with hyperglycemia: Secondary | ICD-10-CM | POA: Diagnosis not present

## 2016-04-17 LAB — BASIC METABOLIC PANEL
BUN: 15 mg/dL (ref 7–25)
CO2: 24 mmol/L (ref 20–31)
CREATININE: 0.73 mg/dL (ref 0.60–0.93)
Calcium: 8.7 mg/dL (ref 8.6–10.4)
Chloride: 107 mmol/L (ref 98–110)
Glucose, Bld: 93 mg/dL (ref 65–99)
POTASSIUM: 4 mmol/L (ref 3.5–5.3)
Sodium: 141 mmol/L (ref 135–146)

## 2016-04-17 LAB — HEMOGLOBIN A1C
HEMOGLOBIN A1C: 6.5 % — AB (ref <5.7)
MEAN PLASMA GLUCOSE: 140 mg/dL

## 2016-04-19 DIAGNOSIS — M169 Osteoarthritis of hip, unspecified: Secondary | ICD-10-CM | POA: Diagnosis not present

## 2016-04-19 DIAGNOSIS — M150 Primary generalized (osteo)arthritis: Secondary | ICD-10-CM | POA: Diagnosis not present

## 2016-04-23 ENCOUNTER — Ambulatory Visit (INDEPENDENT_AMBULATORY_CARE_PROVIDER_SITE_OTHER): Payer: Medicare Other | Admitting: "Endocrinology

## 2016-04-23 ENCOUNTER — Encounter: Payer: Self-pay | Admitting: "Endocrinology

## 2016-04-23 VITALS — BP 142/70 | HR 72 | Ht 68.0 in

## 2016-04-23 DIAGNOSIS — I10 Essential (primary) hypertension: Secondary | ICD-10-CM

## 2016-04-23 DIAGNOSIS — Z794 Long term (current) use of insulin: Secondary | ICD-10-CM | POA: Diagnosis not present

## 2016-04-23 DIAGNOSIS — IMO0002 Reserved for concepts with insufficient information to code with codable children: Secondary | ICD-10-CM

## 2016-04-23 DIAGNOSIS — E118 Type 2 diabetes mellitus with unspecified complications: Secondary | ICD-10-CM | POA: Diagnosis not present

## 2016-04-23 DIAGNOSIS — E785 Hyperlipidemia, unspecified: Secondary | ICD-10-CM

## 2016-04-23 DIAGNOSIS — E1165 Type 2 diabetes mellitus with hyperglycemia: Secondary | ICD-10-CM

## 2016-04-23 DIAGNOSIS — E1140 Type 2 diabetes mellitus with diabetic neuropathy, unspecified: Secondary | ICD-10-CM | POA: Insufficient documentation

## 2016-04-23 NOTE — Patient Instructions (Signed)

## 2016-04-23 NOTE — Progress Notes (Signed)
Subjective:    Patient ID: [name] [name], female    DOB: 02/18/1941, PCP Purvis Kilts, MD   Past Medical History  Diagnosis Date  . CAD in native artery   . Thrombocytopenia, unspecified ([place])   . Unspecified hereditary and idiopathic peripheral neuropathy   . Unspecified fall   . Other and unspecified hyperlipidemia   . Unspecified essential hypertension   . Obesity, unspecified   . Personal history of neurosis   . Intrinsic asthma, unspecified   . Allergic rhinitis, cause unspecified   . Cirrhosis of liver ([facility])     NASH, afp on 06/17/12 =3.7, per pt she had hep B vaccines in 1996, hep A in process.   . Colon adenomas 03/08/10    tcs by Dr. Gala Romney  . Hyperplastic polyps of stomach 03/08/10    tcs by Dr. Dudley Major  . Chronic gastritis 03/09/11    egd by Dr. Gala Romney  . History of hemorrhoids 03/08/10    tcs- internal and external  . Diverticula of colon 03/08/10    L side  . Esophagitis, erosive 03/08/10  . GERD (gastroesophageal reflux disease)   . Wears glasses   . Type II or unspecified type diabetes mellitus without mention of complication, not stated as uncontrolled   . Vertigo   . Pancreatitis   . Bilateral leg weakness   . Thrombocytopenia ([place]) 04/12/2014  . Pancytopenia, acquired ([place]) 04/12/2014  . Cirrhosis ([place])   . Abnormal EKG     hx of left anterior fasicular block on 04-09-13 ekg epic  . Chronic shoulder pain    Past Surgical History  Procedure Laterality Date  . Hysterectomy and btl      s/p  . Tumor excision  2003    rt arm and left foot  . Colonoscopy  03/08/10    Dr. [name]-->ext/int hemorrhoids, anal paipilla, rectal polyps, desc polyps, cecal polyp, left-sided diverticula. suboptiomal prep. next TCS 02/2015. multiple adenomas  . Esophagogastroduodenoscopy  03/08/10    Dr. [name] erosive RE, small hh, antral erosions, small 1cm are of mucosal indentation along gastric body of doubtful significance, cystic nodularity of hypophyarynx, base of tongue,  base of epiglottis. benign gastric biopsies  . Tubal ligation    . Esophagogastroduodenoscopy  10/08/2011    Dr. [name] esophageal varices, antral erosion. next egd 09/2013  . Foot surgery  2003    lt foot  . Eye surgery      cataracts bilateral  . Orif humerus fracture Right 02/17/2013    Procedure: OPEN REDUCTION INTERNAL FIXATION (ORIF) RIGHT PROXIMAL HUMERUS FRACTURE;  Surgeon: [name], MD;  Location: [facility];  Service: Orthopedics;  Laterality: Right;  . Partial gastrectomy      family denies  . Esophagogastroduodenoscopy N/A 02/17/2014    Dr. Gala Romney: portal gastropathy, no varices. Due for surveillance 2017  . Breast surgery Right few yrs ago    benign area removed  . Abdominal hysterectomy    . Cardiac catheterization  02/25/2007    Dr. [name]:  normal LV systolic function, mild irregularities of LAD   Social History   Social History  . Marital Status: Married    Spouse Name: N/A  . Number of Children: 3  . Years of Education: N/A   Social History Main Topics  . Smoking status: Never Smoker   . Smokeless tobacco: Never Used  . Alcohol Use: No  . Drug Use: No  . Sexual Activity: Not Currently    Birth Control/  Protection: Surgical   Other Topics Concern  . None   Social History Narrative   Outpatient Encounter Prescriptions as of 04/23/2016  Medication Sig  . Insulin Lispro (HUMALOG KWIKPEN [place]) Inject 10-16 Units into the skin 3 (three) times daily with meals.  . ALPRAZolam (XANAX) 0.5 MG tablet Take 1 tablet (0.5 mg total) by mouth 3 (three) times daily as needed for anxiety. (Patient taking differently: Take 0.5 mg by mouth 2 (two) times daily. )  . aspirin EC 81 MG tablet Take 81 mg by mouth daily.  . budesonide-formoterol (SYMBICORT) 160-4.5 MCG/ACT inhaler Inhale 2 puffs into the lungs at bedtime.   . cephALEXin (KEFLEX) 500 MG capsule Take 1 capsule (500 mg total) by mouth 4 (four) times daily.  . enalapril (VASOTEC) 5 MG tablet Take 1 tablet by mouth  daily.  Marland Kitchen ESTRACE VAGINAL 0.1 MG/GM vaginal cream Place 1 Applicatorful vaginally every other day. At  bedtime  . furosemide (LASIX) 20 MG tablet Take 1 tablet (20 mg total) by mouth every morning. (Patient taking differently: Take 20 mg by mouth daily as needed for fluid. )  . lactulose (CHRONULAC) 10 GM/15ML solution Take 10 g by mouth 4 (four) times daily.   Marland Kitchen LEVEMIR FLEXTOUCH 100 UNIT/ML Pen INJECT 50 UNITS INTO THE SKIN DAILY AT 10PM  . Multiple Vitamins-Minerals (CENTRUM SILVER PO) Take 1 tablet by mouth daily.   Marland Kitchen NEXIUM 40 MG capsule Take 40 mg by mouth every morning.   Marland Kitchen oxyCODONE (OXY IR/ROXICODONE) 5 MG immediate release tablet Take 5 mg by mouth 2 (two) times daily as needed for moderate pain or severe pain.   . pregabalin (LYRICA) 100 MG capsule Take 100 mg by mouth 2 (two) times daily.  . rifaximin (XIFAXAN) 550 MG TABS tablet Take 1 tablet (550 mg total) by mouth 2 (two) times daily.  Marland Kitchen spironolactone (ALDACTONE) 50 MG tablet TAKE ONE TABLET BY MOUTH ONCE DAILY IN THE MORNING  . triamcinolone cream (KENALOG) 0.1 % Apply 1 application topically daily as needed (for irritation).   . ursodiol (URSO FORTE) 500 MG tablet Take 500 mg by mouth 2 (two) times daily.  . [DISCONTINUED] HUMALOG KWIKPEN 100 UNIT/ML KiwkPen INJECT 10-16 UNITS INTO THE SKIN THREE TIMES DAILY  . [DISCONTINUED] insulin aspart (NOVOLOG FLEXPEN) 100 UNIT/ML FlexPen Inject 10-16 Units into the skin 3 (three) times daily with meals. (Patient taking differently: Inject 0-15 Units into the skin 3 (three) times daily with meals. )   No facility-administered encounter medications on file as of 04/23/2016.   ALLERGIES: Allergies  Allergen Reactions  . Morphine And Related Itching  . Compazine [Prochlorperazine Edisylate] Anxiety    Causes anxiety & nervousness   VACCINATION STATUS: Immunization History  Administered Date(s) Administered  . Influenza,inj,Quad PF,36+ Mos 07/31/2013, 08/05/2014  . Tdap 09/18/2013     Diabetes She presents for her follow-up (Patient missed several appointments, last seen here 20 months ago.) diabetic visit. She has type 2 diabetes mellitus. Onset time: she was diagnosed at approximate age of 11 years. Her disease course has been improving. There are no hypoglycemic associated symptoms. Pertinent negatives for hypoglycemia include no confusion, headaches, pallor or seizures. Pertinent negatives for diabetes include no chest pain, no polydipsia, no polyphagia and no polyuria. There are no hypoglycemic complications. Symptoms are improving. Diabetic complications include peripheral neuropathy. Risk factors for coronary artery disease include diabetes mellitus, hypertension, sedentary lifestyle, obesity and tobacco exposure. She is compliant with treatment some of the time (Her logs show that  NovoLog was utilized 4 times a day including at bedtime against the order provided to her last visit.). Her weight is stable. She is following a generally unhealthy diet. She has not had a previous visit with a dietitian. She never participates in exercise. Her breakfast blood glucose range is generally 140-180 mg/dl. Her lunch blood glucose range is generally 140-180 mg/dl. Her dinner blood glucose range is generally 140-180 mg/dl. Her overall blood glucose range is 140-180 mg/dl. An ACE inhibitor/angiotensin II receptor blocker is being taken.  Hyperlipidemia This is a chronic problem. The current episode started more than 1 year ago. Pertinent negatives include no chest pain, myalgias or shortness of breath. Risk factors for coronary artery disease include diabetes mellitus, hypertension and a sedentary lifestyle.  Hypertension This is a chronic problem. The current episode started more than 1 year ago. Pertinent negatives include no chest pain, headaches, palpitations or shortness of breath. Past treatments include ACE inhibitors.     Review of Systems  Constitutional: Negative for unexpected  weight change.  HENT: Negative for trouble swallowing and voice change.   Eyes: Negative for visual disturbance.  Respiratory: Negative for cough, shortness of breath and wheezing.   Cardiovascular: Negative for chest pain, palpitations and leg swelling.  Gastrointestinal: Negative for nausea, vomiting and diarrhea.  Endocrine: Negative for cold intolerance, heat intolerance, polydipsia, polyphagia and polyuria.  Musculoskeletal: Negative for myalgias and arthralgias.  Skin: Negative for color change, pallor, rash and wound.  Neurological: Negative for seizures and headaches.  Psychiatric/Behavioral: Negative for suicidal ideas and confusion.    Objective:    BP 142/70 mmHg  Pulse 72  Ht 5\' 8"  (1.727 m)  SpO2 95%  Wt Readings from Last 3 Encounters:  03/10/16 242 lb (109.77 kg)  02/24/16 242 lb 15.2 oz (110.2 kg)  01/18/16 243 lb (110.224 kg)    Physical Exam  Constitutional: She is oriented to person, place, and time. She appears well-developed.  Remains wheelchair bound.  HENT:  Head: Normocephalic and atraumatic.  Eyes: EOM are normal.  Neck: Normal range of motion. Neck supple. No tracheal deviation present. No thyromegaly present.  Cardiovascular: Normal rate and regular rhythm.   Pulmonary/Chest: Effort normal and breath sounds normal.  Abdominal: Soft. Bowel sounds are normal. There is no tenderness. There is no guarding.  Musculoskeletal: Normal range of motion. She exhibits no edema.  Neurological: She is alert and oriented to person, place, and time. No cranial nerve deficit. Coordination normal.  Remains wheelchair bound.   Skin: Skin is warm and dry. No rash noted. No erythema. No pallor.  Psychiatric: She has a normal mood and affect. Judgment normal.    Results for orders placed or performed in visit on [id number]  Basic metabolic panel  Result Value Ref Range   Sodium 141 135 - 146 mmol/L   Potassium 4.0 3.5 - 5.3 mmol/L   Chloride 107 98 - 110 mmol/L    CO2 24 20 - 31 mmol/L   Glucose, Bld 93 65 - 99 mg/dL   BUN 15 7 - 25 mg/dL   Creat 0.73 0.60 - 0.93 mg/dL   Calcium 8.7 8.6 - 10.4 mg/dL  Hemoglobin A1c  Result Value Ref Range   Hgb A1c MFr Bld 6.5 (H) <5.7 %   Mean Plasma Glucose 140 mg/dL    Diabetic Labs (most recent): Lab Results  Component Value Date   HGBA1C 6.5* 04/17/2016   HGBA1C 7.1* 12/26/2015   HGBA1C 7.9* 09/28/2015   Lipid profile (most recent):  Lab Results  Component Value Date   TRIG 53 04/28/2013   CHOL 132 04/28/2013    Assessment & Plan:   1. Uncontrolled type 2 diabetes mellitus with peripheral neuropathy ([place])  - Her  diabetes is  complicated by neuropathy and patient remains at a high risk for more acute and chronic complications of diabetes which include CAD, CVA, CKD, retinopathy, and neuropathy. These are all discussed in detail with the patient.  Patient came with no glucose readings nor meter ,  and  recent A1c 6.5% improving from 7.9 %.  Recent labs reviewed.   - I have re-counseled the patient on diet management and weight loss  by adopting a carbohydrate restricted / protein rich  Diet.  - Suggestion is made for patient to avoid simple carbohydrates   from their diet including Cakes , Desserts, Ice Cream,  Soda (  diet and regular) , Sweet Tea , Candies,  Chips, Cookies, Artificial Sweeteners,   and "Sugar-free" Products .  This will help patient to have stable blood glucose profile and potentially avoid unintended  Weight gain.  - Patient is advised to stick to a routine mealtimes to eat 3 meals  a day and avoid unnecessary snacks ( to snack only to correct hypoglycemia).  - The patient  Will be scheduled with Jearld Fenton, RDN, CDE for individualized DM education.  - I have approached patient with the following individualized plan to manage diabetes and patient agrees.   - I will   Continue  Levemir 50 units daily at bedtime, and  Humalog 10 units TIDAC plus SSI.  -I have discussed with  the patient to avoid using NovoLog at bedtime. - she is asked to resume to document BG readings ac and hs.  -insulin is the only safe medication she should be getting given her hx of  Liver failure from unidentified medication. - Patient specific target  for A1c; LDL, HDL, Triglycerides, and  Waist Circumference were discussed in detail.  2) BP/HTN: uncontrolled . Continue current medications including ACEI/ARB.  3)  Weight/Diet: CDE consult will be requested, basic  carbohydrates information provided.  4) Chronic Care/Health Maintenance:  -Patient is on ACEI/ARB  medications and encouraged to continue to follow up with Ophthalmology, Podiatrist at least yearly or according to recommendations, and advised to stay away from smoking. I have recommended yearly flu vaccine and pneumonia vaccination at least every 5 years; and  sleep for at least 7 hours a day.  - 25 minutes of time was spent on the care of this patient , 50% of which was applied for counseling on diabetes complications and their preventions.  - I advised patient to maintain close follow up with Purvis Kilts, MD for primary care needs.  Patient is asked to bring meter and  blood glucose logs during their next visit.   Follow up plan: -Return in about 3 months (around 07/24/2016) for diabetes, high blood pressure, high cholesterol, follow up with pre-visit labs, meter, and logs.  Glade Lloyd, MD Phone: [phone]  Fax: [phone]   04/23/2016, 10:38 AM

## 2016-05-01 ENCOUNTER — Ambulatory Visit (INDEPENDENT_AMBULATORY_CARE_PROVIDER_SITE_OTHER): Payer: Medicare Other | Admitting: Urology

## 2016-05-01 ENCOUNTER — Other Ambulatory Visit (HOSPITAL_COMMUNITY)
Admission: RE | Admit: 2016-05-01 | Discharge: 2016-05-01 | Disposition: A | Discharge status: Home / Self Care | Payer: Medicare Other | Source: Other Acute Inpatient Hospital | Attending: Urology | Admitting: Urology

## 2016-05-01 DIAGNOSIS — N390 Urinary tract infection, site not specified: Secondary | ICD-10-CM | POA: Insufficient documentation

## 2016-05-01 DIAGNOSIS — N952 Postmenopausal atrophic vaginitis: Secondary | ICD-10-CM | POA: Diagnosis not present

## 2016-05-01 DIAGNOSIS — D3001 Benign neoplasm of right kidney: Secondary | ICD-10-CM | POA: Diagnosis not present

## 2016-05-01 DIAGNOSIS — N3020 Other chronic cystitis without hematuria: Secondary | ICD-10-CM | POA: Diagnosis not present

## 2016-05-01 LAB — URINALYSIS, ROUTINE W REFLEX MICROSCOPIC
Bilirubin Urine: NEGATIVE
GLUCOSE, UA: 250 mg/dL — AB
Hgb urine dipstick: NEGATIVE
Ketones, ur: NEGATIVE mg/dL
LEUKOCYTES UA: NEGATIVE
Nitrite: POSITIVE — AB
PH: 5.5 (ref 5.0–8.0)
Protein, ur: NEGATIVE mg/dL
Specific Gravity, Urine: >1.03 — ABNORMAL HIGH (ref 1.005–1.030)

## 2016-05-01 LAB — URINE MICROSCOPIC-ADD ON: RBC / HPF: NONE SEEN RBC/hpf (ref 0–5)

## 2016-05-13 DIAGNOSIS — E1142 Type 2 diabetes mellitus with diabetic polyneuropathy: Secondary | ICD-10-CM | POA: Diagnosis not present

## 2016-05-13 DIAGNOSIS — E7220 Disorder of urea cycle metabolism, unspecified: Secondary | ICD-10-CM | POA: Diagnosis not present

## 2016-05-19 DIAGNOSIS — M169 Osteoarthritis of hip, unspecified: Secondary | ICD-10-CM | POA: Diagnosis not present

## 2016-05-19 DIAGNOSIS — M150 Primary generalized (osteo)arthritis: Secondary | ICD-10-CM | POA: Diagnosis not present

## 2016-05-31 DIAGNOSIS — G894 Chronic pain syndrome: Secondary | ICD-10-CM | POA: Diagnosis not present

## 2016-05-31 DIAGNOSIS — K7290 Hepatic failure, unspecified without coma: Secondary | ICD-10-CM | POA: Diagnosis not present

## 2016-05-31 DIAGNOSIS — Z1389 Encounter for screening for other disorder: Secondary | ICD-10-CM | POA: Diagnosis not present

## 2016-06-02 DIAGNOSIS — E1142 Type 2 diabetes mellitus with diabetic polyneuropathy: Secondary | ICD-10-CM | POA: Diagnosis not present

## 2016-06-02 DIAGNOSIS — K7460 Unspecified cirrhosis of liver: Secondary | ICD-10-CM | POA: Diagnosis not present

## 2016-06-02 DIAGNOSIS — I10 Essential (primary) hypertension: Secondary | ICD-10-CM | POA: Diagnosis not present

## 2016-06-02 DIAGNOSIS — I2510 Atherosclerotic heart disease of native coronary artery without angina pectoris: Secondary | ICD-10-CM | POA: Diagnosis not present

## 2016-06-02 DIAGNOSIS — J45909 Unspecified asthma, uncomplicated: Secondary | ICD-10-CM | POA: Diagnosis not present

## 2016-06-04 DIAGNOSIS — E1151 Type 2 diabetes mellitus with diabetic peripheral angiopathy without gangrene: Secondary | ICD-10-CM | POA: Diagnosis not present

## 2016-06-04 DIAGNOSIS — E1140 Type 2 diabetes mellitus with diabetic neuropathy, unspecified: Secondary | ICD-10-CM | POA: Diagnosis not present

## 2016-06-13 DIAGNOSIS — E1142 Type 2 diabetes mellitus with diabetic polyneuropathy: Secondary | ICD-10-CM | POA: Diagnosis not present

## 2016-06-13 DIAGNOSIS — E7220 Disorder of urea cycle metabolism, unspecified: Secondary | ICD-10-CM | POA: Diagnosis not present

## 2016-06-19 DIAGNOSIS — M169 Osteoarthritis of hip, unspecified: Secondary | ICD-10-CM | POA: Diagnosis not present

## 2016-06-19 DIAGNOSIS — M150 Primary generalized (osteo)arthritis: Secondary | ICD-10-CM | POA: Diagnosis not present

## 2016-06-20 DIAGNOSIS — M6281 Muscle weakness (generalized): Secondary | ICD-10-CM | POA: Diagnosis not present

## 2016-06-20 DIAGNOSIS — E119 Type 2 diabetes mellitus without complications: Secondary | ICD-10-CM | POA: Diagnosis not present

## 2016-06-20 DIAGNOSIS — M150 Primary generalized (osteo)arthritis: Secondary | ICD-10-CM | POA: Diagnosis not present

## 2016-06-20 DIAGNOSIS — Z7984 Long term (current) use of oral hypoglycemic drugs: Secondary | ICD-10-CM | POA: Diagnosis not present

## 2016-06-20 DIAGNOSIS — K7290 Hepatic failure, unspecified without coma: Secondary | ICD-10-CM | POA: Diagnosis not present

## 2016-06-20 DIAGNOSIS — G894 Chronic pain syndrome: Secondary | ICD-10-CM | POA: Diagnosis not present

## 2016-06-20 DIAGNOSIS — Z9181 History of falling: Secondary | ICD-10-CM | POA: Diagnosis not present

## 2016-06-20 DIAGNOSIS — Z794 Long term (current) use of insulin: Secondary | ICD-10-CM | POA: Diagnosis not present

## 2016-06-20 DIAGNOSIS — Z7982 Long term (current) use of aspirin: Secondary | ICD-10-CM | POA: Diagnosis not present

## 2016-06-22 DIAGNOSIS — K7290 Hepatic failure, unspecified without coma: Secondary | ICD-10-CM | POA: Diagnosis not present

## 2016-06-22 DIAGNOSIS — E119 Type 2 diabetes mellitus without complications: Secondary | ICD-10-CM | POA: Diagnosis not present

## 2016-06-22 DIAGNOSIS — G894 Chronic pain syndrome: Secondary | ICD-10-CM | POA: Diagnosis not present

## 2016-06-22 DIAGNOSIS — Z7982 Long term (current) use of aspirin: Secondary | ICD-10-CM | POA: Diagnosis not present

## 2016-06-22 DIAGNOSIS — M6281 Muscle weakness (generalized): Secondary | ICD-10-CM | POA: Diagnosis not present

## 2016-06-22 DIAGNOSIS — Z9181 History of falling: Secondary | ICD-10-CM | POA: Diagnosis not present

## 2016-06-22 DIAGNOSIS — M150 Primary generalized (osteo)arthritis: Secondary | ICD-10-CM | POA: Diagnosis not present

## 2016-06-22 DIAGNOSIS — Z794 Long term (current) use of insulin: Secondary | ICD-10-CM | POA: Diagnosis not present

## 2016-06-22 DIAGNOSIS — Z7984 Long term (current) use of oral hypoglycemic drugs: Secondary | ICD-10-CM | POA: Diagnosis not present

## 2016-06-25 DIAGNOSIS — Z9181 History of falling: Secondary | ICD-10-CM | POA: Diagnosis not present

## 2016-06-25 DIAGNOSIS — Z7982 Long term (current) use of aspirin: Secondary | ICD-10-CM | POA: Diagnosis not present

## 2016-06-25 DIAGNOSIS — E119 Type 2 diabetes mellitus without complications: Secondary | ICD-10-CM | POA: Diagnosis not present

## 2016-06-25 DIAGNOSIS — Z794 Long term (current) use of insulin: Secondary | ICD-10-CM | POA: Diagnosis not present

## 2016-06-25 DIAGNOSIS — Z7984 Long term (current) use of oral hypoglycemic drugs: Secondary | ICD-10-CM | POA: Diagnosis not present

## 2016-06-25 DIAGNOSIS — G894 Chronic pain syndrome: Secondary | ICD-10-CM | POA: Diagnosis not present

## 2016-06-25 DIAGNOSIS — M6281 Muscle weakness (generalized): Secondary | ICD-10-CM | POA: Diagnosis not present

## 2016-06-25 DIAGNOSIS — K7290 Hepatic failure, unspecified without coma: Secondary | ICD-10-CM | POA: Diagnosis not present

## 2016-06-25 DIAGNOSIS — M150 Primary generalized (osteo)arthritis: Secondary | ICD-10-CM | POA: Diagnosis not present

## 2016-06-27 DIAGNOSIS — Z794 Long term (current) use of insulin: Secondary | ICD-10-CM | POA: Diagnosis not present

## 2016-06-27 DIAGNOSIS — Z7982 Long term (current) use of aspirin: Secondary | ICD-10-CM | POA: Diagnosis not present

## 2016-06-27 DIAGNOSIS — M6281 Muscle weakness (generalized): Secondary | ICD-10-CM | POA: Diagnosis not present

## 2016-06-27 DIAGNOSIS — K7290 Hepatic failure, unspecified without coma: Secondary | ICD-10-CM | POA: Diagnosis not present

## 2016-06-27 DIAGNOSIS — E119 Type 2 diabetes mellitus without complications: Secondary | ICD-10-CM | POA: Diagnosis not present

## 2016-06-27 DIAGNOSIS — Z9181 History of falling: Secondary | ICD-10-CM | POA: Diagnosis not present

## 2016-06-27 DIAGNOSIS — Z7984 Long term (current) use of oral hypoglycemic drugs: Secondary | ICD-10-CM | POA: Diagnosis not present

## 2016-06-27 DIAGNOSIS — M150 Primary generalized (osteo)arthritis: Secondary | ICD-10-CM | POA: Diagnosis not present

## 2016-06-27 DIAGNOSIS — G894 Chronic pain syndrome: Secondary | ICD-10-CM | POA: Diagnosis not present

## 2016-06-30 ENCOUNTER — Other Ambulatory Visit: Payer: Self-pay | Admitting: "Endocrinology

## 2016-07-02 DIAGNOSIS — Z794 Long term (current) use of insulin: Secondary | ICD-10-CM | POA: Diagnosis not present

## 2016-07-02 DIAGNOSIS — E119 Type 2 diabetes mellitus without complications: Secondary | ICD-10-CM | POA: Diagnosis not present

## 2016-07-02 DIAGNOSIS — Z7984 Long term (current) use of oral hypoglycemic drugs: Secondary | ICD-10-CM | POA: Diagnosis not present

## 2016-07-02 DIAGNOSIS — M6281 Muscle weakness (generalized): Secondary | ICD-10-CM | POA: Diagnosis not present

## 2016-07-02 DIAGNOSIS — Z9181 History of falling: Secondary | ICD-10-CM | POA: Diagnosis not present

## 2016-07-02 DIAGNOSIS — Z7982 Long term (current) use of aspirin: Secondary | ICD-10-CM | POA: Diagnosis not present

## 2016-07-02 DIAGNOSIS — M150 Primary generalized (osteo)arthritis: Secondary | ICD-10-CM | POA: Diagnosis not present

## 2016-07-02 DIAGNOSIS — G894 Chronic pain syndrome: Secondary | ICD-10-CM | POA: Diagnosis not present

## 2016-07-02 DIAGNOSIS — K7290 Hepatic failure, unspecified without coma: Secondary | ICD-10-CM | POA: Diagnosis not present

## 2016-07-05 DIAGNOSIS — Z7982 Long term (current) use of aspirin: Secondary | ICD-10-CM | POA: Diagnosis not present

## 2016-07-05 DIAGNOSIS — Z794 Long term (current) use of insulin: Secondary | ICD-10-CM | POA: Diagnosis not present

## 2016-07-05 DIAGNOSIS — E119 Type 2 diabetes mellitus without complications: Secondary | ICD-10-CM | POA: Diagnosis not present

## 2016-07-05 DIAGNOSIS — G894 Chronic pain syndrome: Secondary | ICD-10-CM | POA: Diagnosis not present

## 2016-07-05 DIAGNOSIS — M150 Primary generalized (osteo)arthritis: Secondary | ICD-10-CM | POA: Diagnosis not present

## 2016-07-05 DIAGNOSIS — Z7984 Long term (current) use of oral hypoglycemic drugs: Secondary | ICD-10-CM | POA: Diagnosis not present

## 2016-07-05 DIAGNOSIS — M6281 Muscle weakness (generalized): Secondary | ICD-10-CM | POA: Diagnosis not present

## 2016-07-05 DIAGNOSIS — Z9181 History of falling: Secondary | ICD-10-CM | POA: Diagnosis not present

## 2016-07-05 DIAGNOSIS — K7290 Hepatic failure, unspecified without coma: Secondary | ICD-10-CM | POA: Diagnosis not present

## 2016-07-09 DIAGNOSIS — E119 Type 2 diabetes mellitus without complications: Secondary | ICD-10-CM | POA: Diagnosis not present

## 2016-07-09 DIAGNOSIS — K7290 Hepatic failure, unspecified without coma: Secondary | ICD-10-CM | POA: Diagnosis not present

## 2016-07-09 DIAGNOSIS — G894 Chronic pain syndrome: Secondary | ICD-10-CM | POA: Diagnosis not present

## 2016-07-09 DIAGNOSIS — Z7982 Long term (current) use of aspirin: Secondary | ICD-10-CM | POA: Diagnosis not present

## 2016-07-09 DIAGNOSIS — Z7984 Long term (current) use of oral hypoglycemic drugs: Secondary | ICD-10-CM | POA: Diagnosis not present

## 2016-07-09 DIAGNOSIS — Z794 Long term (current) use of insulin: Secondary | ICD-10-CM | POA: Diagnosis not present

## 2016-07-09 DIAGNOSIS — M150 Primary generalized (osteo)arthritis: Secondary | ICD-10-CM | POA: Diagnosis not present

## 2016-07-09 DIAGNOSIS — M6281 Muscle weakness (generalized): Secondary | ICD-10-CM | POA: Diagnosis not present

## 2016-07-09 DIAGNOSIS — Z9181 History of falling: Secondary | ICD-10-CM | POA: Diagnosis not present

## 2016-07-12 ENCOUNTER — Other Ambulatory Visit: Payer: Self-pay | Admitting: "Endocrinology

## 2016-07-13 DIAGNOSIS — K7290 Hepatic failure, unspecified without coma: Secondary | ICD-10-CM | POA: Diagnosis not present

## 2016-07-13 DIAGNOSIS — Z7982 Long term (current) use of aspirin: Secondary | ICD-10-CM | POA: Diagnosis not present

## 2016-07-13 DIAGNOSIS — E119 Type 2 diabetes mellitus without complications: Secondary | ICD-10-CM | POA: Diagnosis not present

## 2016-07-13 DIAGNOSIS — Z7984 Long term (current) use of oral hypoglycemic drugs: Secondary | ICD-10-CM | POA: Diagnosis not present

## 2016-07-13 DIAGNOSIS — M6281 Muscle weakness (generalized): Secondary | ICD-10-CM | POA: Diagnosis not present

## 2016-07-13 DIAGNOSIS — G894 Chronic pain syndrome: Secondary | ICD-10-CM | POA: Diagnosis not present

## 2016-07-13 DIAGNOSIS — Z9181 History of falling: Secondary | ICD-10-CM | POA: Diagnosis not present

## 2016-07-13 DIAGNOSIS — M150 Primary generalized (osteo)arthritis: Secondary | ICD-10-CM | POA: Diagnosis not present

## 2016-07-13 DIAGNOSIS — Z794 Long term (current) use of insulin: Secondary | ICD-10-CM | POA: Diagnosis not present

## 2016-07-14 DIAGNOSIS — E1142 Type 2 diabetes mellitus with diabetic polyneuropathy: Secondary | ICD-10-CM | POA: Diagnosis not present

## 2016-07-14 DIAGNOSIS — E7220 Disorder of urea cycle metabolism, unspecified: Secondary | ICD-10-CM | POA: Diagnosis not present

## 2016-07-17 ENCOUNTER — Other Ambulatory Visit: Payer: Self-pay | Admitting: "Endocrinology

## 2016-07-17 DIAGNOSIS — E1165 Type 2 diabetes mellitus with hyperglycemia: Secondary | ICD-10-CM | POA: Diagnosis not present

## 2016-07-17 DIAGNOSIS — Z794 Long term (current) use of insulin: Secondary | ICD-10-CM | POA: Diagnosis not present

## 2016-07-17 DIAGNOSIS — E118 Type 2 diabetes mellitus with unspecified complications: Secondary | ICD-10-CM | POA: Diagnosis not present

## 2016-07-17 LAB — COMPLETE METABOLIC PANEL WITH GFR
ALT: 11 U/L (ref 6–29)
AST: 19 U/L (ref 10–35)
Albumin: 3.6 g/dL (ref 3.6–5.1)
Alkaline Phosphatase: 113 U/L (ref 33–130)
BUN: 14 mg/dL (ref 7–25)
CALCIUM: 9 mg/dL (ref 8.6–10.4)
CHLORIDE: 103 mmol/L (ref 98–110)
CO2: 19 mmol/L — ABNORMAL LOW (ref 20–31)
CREATININE: 0.67 mg/dL (ref 0.60–0.93)
GFR, EST NON AFRICAN AMERICAN: 87 mL/min (ref >=60)
Glucose, Bld: 163 mg/dL — ABNORMAL HIGH (ref 65–99)
POTASSIUM: 4.2 mmol/L (ref 3.5–5.3)
Sodium: 138 mmol/L (ref 135–146)
Total Bilirubin: 1.7 mg/dL — ABNORMAL HIGH (ref 0.2–1.2)
Total Protein: 6.2 g/dL (ref 6.1–8.1)

## 2016-07-18 DIAGNOSIS — G894 Chronic pain syndrome: Secondary | ICD-10-CM | POA: Diagnosis not present

## 2016-07-18 DIAGNOSIS — Z794 Long term (current) use of insulin: Secondary | ICD-10-CM | POA: Diagnosis not present

## 2016-07-18 DIAGNOSIS — M6281 Muscle weakness (generalized): Secondary | ICD-10-CM | POA: Diagnosis not present

## 2016-07-18 DIAGNOSIS — M150 Primary generalized (osteo)arthritis: Secondary | ICD-10-CM | POA: Diagnosis not present

## 2016-07-18 DIAGNOSIS — K7290 Hepatic failure, unspecified without coma: Secondary | ICD-10-CM | POA: Diagnosis not present

## 2016-07-18 DIAGNOSIS — Z7984 Long term (current) use of oral hypoglycemic drugs: Secondary | ICD-10-CM | POA: Diagnosis not present

## 2016-07-18 DIAGNOSIS — Z7982 Long term (current) use of aspirin: Secondary | ICD-10-CM | POA: Diagnosis not present

## 2016-07-18 DIAGNOSIS — E119 Type 2 diabetes mellitus without complications: Secondary | ICD-10-CM | POA: Diagnosis not present

## 2016-07-18 DIAGNOSIS — Z9181 History of falling: Secondary | ICD-10-CM | POA: Diagnosis not present

## 2016-07-18 LAB — HEMOGLOBIN A1C
HEMOGLOBIN A1C: 6.7 % — AB (ref <5.7)
MEAN PLASMA GLUCOSE: 146 mg/dL

## 2016-07-19 DIAGNOSIS — Z7984 Long term (current) use of oral hypoglycemic drugs: Secondary | ICD-10-CM | POA: Diagnosis not present

## 2016-07-19 DIAGNOSIS — E119 Type 2 diabetes mellitus without complications: Secondary | ICD-10-CM | POA: Diagnosis not present

## 2016-07-19 DIAGNOSIS — M6281 Muscle weakness (generalized): Secondary | ICD-10-CM | POA: Diagnosis not present

## 2016-07-19 DIAGNOSIS — G894 Chronic pain syndrome: Secondary | ICD-10-CM | POA: Diagnosis not present

## 2016-07-19 DIAGNOSIS — Z794 Long term (current) use of insulin: Secondary | ICD-10-CM | POA: Diagnosis not present

## 2016-07-19 DIAGNOSIS — Z9181 History of falling: Secondary | ICD-10-CM | POA: Diagnosis not present

## 2016-07-19 DIAGNOSIS — M150 Primary generalized (osteo)arthritis: Secondary | ICD-10-CM | POA: Diagnosis not present

## 2016-07-19 DIAGNOSIS — Z7982 Long term (current) use of aspirin: Secondary | ICD-10-CM | POA: Diagnosis not present

## 2016-07-19 DIAGNOSIS — K7290 Hepatic failure, unspecified without coma: Secondary | ICD-10-CM | POA: Diagnosis not present

## 2016-07-20 DIAGNOSIS — G894 Chronic pain syndrome: Secondary | ICD-10-CM | POA: Diagnosis not present

## 2016-07-20 DIAGNOSIS — E119 Type 2 diabetes mellitus without complications: Secondary | ICD-10-CM | POA: Diagnosis not present

## 2016-07-20 DIAGNOSIS — Z7982 Long term (current) use of aspirin: Secondary | ICD-10-CM | POA: Diagnosis not present

## 2016-07-20 DIAGNOSIS — Z794 Long term (current) use of insulin: Secondary | ICD-10-CM | POA: Diagnosis not present

## 2016-07-20 DIAGNOSIS — M169 Osteoarthritis of hip, unspecified: Secondary | ICD-10-CM | POA: Diagnosis not present

## 2016-07-20 DIAGNOSIS — Z7984 Long term (current) use of oral hypoglycemic drugs: Secondary | ICD-10-CM | POA: Diagnosis not present

## 2016-07-20 DIAGNOSIS — K7290 Hepatic failure, unspecified without coma: Secondary | ICD-10-CM | POA: Diagnosis not present

## 2016-07-20 DIAGNOSIS — M150 Primary generalized (osteo)arthritis: Secondary | ICD-10-CM | POA: Diagnosis not present

## 2016-07-20 DIAGNOSIS — Z9181 History of falling: Secondary | ICD-10-CM | POA: Diagnosis not present

## 2016-07-20 DIAGNOSIS — M6281 Muscle weakness (generalized): Secondary | ICD-10-CM | POA: Diagnosis not present

## 2016-07-23 DIAGNOSIS — E119 Type 2 diabetes mellitus without complications: Secondary | ICD-10-CM | POA: Diagnosis not present

## 2016-07-23 DIAGNOSIS — G894 Chronic pain syndrome: Secondary | ICD-10-CM | POA: Diagnosis not present

## 2016-07-23 DIAGNOSIS — M6281 Muscle weakness (generalized): Secondary | ICD-10-CM | POA: Diagnosis not present

## 2016-07-23 DIAGNOSIS — Z794 Long term (current) use of insulin: Secondary | ICD-10-CM | POA: Diagnosis not present

## 2016-07-23 DIAGNOSIS — Z9181 History of falling: Secondary | ICD-10-CM | POA: Diagnosis not present

## 2016-07-23 DIAGNOSIS — Z7982 Long term (current) use of aspirin: Secondary | ICD-10-CM | POA: Diagnosis not present

## 2016-07-23 DIAGNOSIS — K7290 Hepatic failure, unspecified without coma: Secondary | ICD-10-CM | POA: Diagnosis not present

## 2016-07-23 DIAGNOSIS — M150 Primary generalized (osteo)arthritis: Secondary | ICD-10-CM | POA: Diagnosis not present

## 2016-07-23 DIAGNOSIS — Z7984 Long term (current) use of oral hypoglycemic drugs: Secondary | ICD-10-CM | POA: Diagnosis not present

## 2016-07-24 ENCOUNTER — Ambulatory Visit: Payer: Medicare Other | Admitting: "Endocrinology

## 2016-07-25 DIAGNOSIS — Z9181 History of falling: Secondary | ICD-10-CM | POA: Diagnosis not present

## 2016-07-25 DIAGNOSIS — K7290 Hepatic failure, unspecified without coma: Secondary | ICD-10-CM | POA: Diagnosis not present

## 2016-07-25 DIAGNOSIS — Z7984 Long term (current) use of oral hypoglycemic drugs: Secondary | ICD-10-CM | POA: Diagnosis not present

## 2016-07-25 DIAGNOSIS — E119 Type 2 diabetes mellitus without complications: Secondary | ICD-10-CM | POA: Diagnosis not present

## 2016-07-25 DIAGNOSIS — Z7982 Long term (current) use of aspirin: Secondary | ICD-10-CM | POA: Diagnosis not present

## 2016-07-25 DIAGNOSIS — Z794 Long term (current) use of insulin: Secondary | ICD-10-CM | POA: Diagnosis not present

## 2016-07-25 DIAGNOSIS — M150 Primary generalized (osteo)arthritis: Secondary | ICD-10-CM | POA: Diagnosis not present

## 2016-07-25 DIAGNOSIS — M6281 Muscle weakness (generalized): Secondary | ICD-10-CM | POA: Diagnosis not present

## 2016-07-25 DIAGNOSIS — G894 Chronic pain syndrome: Secondary | ICD-10-CM | POA: Diagnosis not present

## 2016-07-31 DIAGNOSIS — N390 Urinary tract infection, site not specified: Secondary | ICD-10-CM | POA: Diagnosis not present

## 2016-07-31 DIAGNOSIS — K7460 Unspecified cirrhosis of liver: Secondary | ICD-10-CM | POA: Diagnosis not present

## 2016-07-31 DIAGNOSIS — E1142 Type 2 diabetes mellitus with diabetic polyneuropathy: Secondary | ICD-10-CM | POA: Diagnosis not present

## 2016-08-01 DIAGNOSIS — I10 Essential (primary) hypertension: Secondary | ICD-10-CM | POA: Diagnosis not present

## 2016-08-01 DIAGNOSIS — J45909 Unspecified asthma, uncomplicated: Secondary | ICD-10-CM | POA: Diagnosis not present

## 2016-08-01 DIAGNOSIS — E1142 Type 2 diabetes mellitus with diabetic polyneuropathy: Secondary | ICD-10-CM | POA: Diagnosis not present

## 2016-08-01 DIAGNOSIS — K7460 Unspecified cirrhosis of liver: Secondary | ICD-10-CM | POA: Diagnosis not present

## 2016-08-01 DIAGNOSIS — I2510 Atherosclerotic heart disease of native coronary artery without angina pectoris: Secondary | ICD-10-CM | POA: Diagnosis not present

## 2016-08-02 ENCOUNTER — Inpatient Hospital Stay (HOSPITAL_COMMUNITY)
Admission: EM | Admit: 2016-08-02 | Discharge: 2016-08-06 | DRG: 442 | Disposition: A | Discharge status: Home Health | Payer: Medicare Other | Attending: Internal Medicine | Admitting: Internal Medicine

## 2016-08-02 ENCOUNTER — Encounter (HOSPITAL_COMMUNITY): Payer: Self-pay | Admitting: Emergency Medicine

## 2016-08-02 ENCOUNTER — Emergency Department (HOSPITAL_COMMUNITY): Payer: Medicare Other

## 2016-08-02 DIAGNOSIS — Z7982 Long term (current) use of aspirin: Secondary | ICD-10-CM

## 2016-08-02 DIAGNOSIS — Z833 Family history of diabetes mellitus: Secondary | ICD-10-CM

## 2016-08-02 DIAGNOSIS — R609 Edema, unspecified: Secondary | ICD-10-CM

## 2016-08-02 DIAGNOSIS — N390 Urinary tract infection, site not specified: Secondary | ICD-10-CM | POA: Diagnosis not present

## 2016-08-02 DIAGNOSIS — K7290 Hepatic failure, unspecified without coma: Principal | ICD-10-CM | POA: Diagnosis present

## 2016-08-02 DIAGNOSIS — E785 Hyperlipidemia, unspecified: Secondary | ICD-10-CM | POA: Diagnosis present

## 2016-08-02 DIAGNOSIS — K7581 Nonalcoholic steatohepatitis (NASH): Secondary | ICD-10-CM | POA: Diagnosis present

## 2016-08-02 DIAGNOSIS — Z7951 Long term (current) use of inhaled steroids: Secondary | ICD-10-CM

## 2016-08-02 DIAGNOSIS — E871 Hypo-osmolality and hyponatremia: Secondary | ICD-10-CM | POA: Diagnosis present

## 2016-08-02 DIAGNOSIS — I10 Essential (primary) hypertension: Secondary | ICD-10-CM | POA: Diagnosis present

## 2016-08-02 DIAGNOSIS — Z79891 Long term (current) use of opiate analgesic: Secondary | ICD-10-CM | POA: Diagnosis not present

## 2016-08-02 DIAGNOSIS — Z8249 Family history of ischemic heart disease and other diseases of the circulatory system: Secondary | ICD-10-CM

## 2016-08-02 DIAGNOSIS — K219 Gastro-esophageal reflux disease without esophagitis: Secondary | ICD-10-CM | POA: Diagnosis present

## 2016-08-02 DIAGNOSIS — R531 Weakness: Secondary | ICD-10-CM | POA: Diagnosis not present

## 2016-08-02 DIAGNOSIS — D61818 Other pancytopenia: Secondary | ICD-10-CM | POA: Diagnosis not present

## 2016-08-02 DIAGNOSIS — Z6836 Body mass index (BMI) 36.0-36.9, adult: Secondary | ICD-10-CM

## 2016-08-02 DIAGNOSIS — J45909 Unspecified asthma, uncomplicated: Secondary | ICD-10-CM | POA: Diagnosis not present

## 2016-08-02 DIAGNOSIS — G9340 Encephalopathy, unspecified: Secondary | ICD-10-CM | POA: Diagnosis present

## 2016-08-02 DIAGNOSIS — R4182 Altered mental status, unspecified: Secondary | ICD-10-CM | POA: Diagnosis not present

## 2016-08-02 DIAGNOSIS — K7460 Unspecified cirrhosis of liver: Secondary | ICD-10-CM | POA: Diagnosis present

## 2016-08-02 DIAGNOSIS — K7682 Hepatic encephalopathy: Secondary | ICD-10-CM

## 2016-08-02 DIAGNOSIS — E1165 Type 2 diabetes mellitus with hyperglycemia: Secondary | ICD-10-CM | POA: Diagnosis not present

## 2016-08-02 DIAGNOSIS — R404 Transient alteration of awareness: Secondary | ICD-10-CM | POA: Diagnosis not present

## 2016-08-02 DIAGNOSIS — I2510 Atherosclerotic heart disease of native coronary artery without angina pectoris: Secondary | ICD-10-CM | POA: Diagnosis present

## 2016-08-02 DIAGNOSIS — Z794 Long term (current) use of insulin: Secondary | ICD-10-CM

## 2016-08-02 DIAGNOSIS — Z23 Encounter for immunization: Secondary | ICD-10-CM

## 2016-08-02 DIAGNOSIS — D696 Thrombocytopenia, unspecified: Secondary | ICD-10-CM

## 2016-08-02 DIAGNOSIS — D72819 Decreased white blood cell count, unspecified: Secondary | ICD-10-CM | POA: Diagnosis not present

## 2016-08-02 DIAGNOSIS — R0989 Other specified symptoms and signs involving the circulatory and respiratory systems: Secondary | ICD-10-CM | POA: Diagnosis not present

## 2016-08-02 DIAGNOSIS — B961 Klebsiella pneumoniae [K. pneumoniae] as the cause of diseases classified elsewhere: Secondary | ICD-10-CM | POA: Diagnosis not present

## 2016-08-02 DIAGNOSIS — Z87440 Personal history of urinary (tract) infections: Secondary | ICD-10-CM

## 2016-08-02 DIAGNOSIS — E118 Type 2 diabetes mellitus with unspecified complications: Secondary | ICD-10-CM

## 2016-08-02 DIAGNOSIS — D649 Anemia, unspecified: Secondary | ICD-10-CM | POA: Diagnosis present

## 2016-08-02 DIAGNOSIS — E1140 Type 2 diabetes mellitus with diabetic neuropathy, unspecified: Secondary | ICD-10-CM

## 2016-08-02 DIAGNOSIS — Z9181 History of falling: Secondary | ICD-10-CM

## 2016-08-02 LAB — CBC WITH DIFFERENTIAL/PLATELET
BASOS PCT: 1 %
Basophils Absolute: 0 10*3/uL (ref 0.0–0.1)
EOS ABS: 0.1 10*3/uL (ref 0.0–0.7)
EOS PCT: 2 %
HCT: 36.3 % (ref 36.0–46.0)
HEMOGLOBIN: 11.7 g/dL — AB (ref 12.0–15.0)
LYMPHS PCT: 24 %
Lymphs Abs: 0.9 10*3/uL (ref 0.7–4.0)
MCH: 26.1 pg (ref 26.0–34.0)
MCHC: 32.2 g/dL (ref 30.0–36.0)
MCV: 81 fL (ref 78.0–100.0)
MONO ABS: 0.3 10*3/uL (ref 0.1–1.0)
Monocytes Relative: 9 %
NEUTROS ABS: 2.4 10*3/uL (ref 1.7–7.7)
NEUTROS PCT: 64 %
Platelets: 116 10*3/uL — ABNORMAL LOW (ref 150–400)
RBC: 4.48 MIL/uL (ref 3.87–5.11)
RDW: 15 % (ref 11.5–15.5)
WBC: 3.7 10*3/uL — ABNORMAL LOW (ref 4.0–10.5)

## 2016-08-02 LAB — URINALYSIS, ROUTINE W REFLEX MICROSCOPIC
BILIRUBIN URINE: NEGATIVE
Glucose, UA: 250 mg/dL — AB
Hgb urine dipstick: NEGATIVE
KETONES UR: NEGATIVE mg/dL
NITRITE: POSITIVE — AB
PH: 5.5 (ref 5.0–8.0)
PROTEIN: NEGATIVE mg/dL
Specific Gravity, Urine: 1.02 (ref 1.005–1.030)

## 2016-08-02 LAB — GLUCOSE, CAPILLARY: Glucose-Capillary: 173 mg/dL — ABNORMAL HIGH (ref 65–99)

## 2016-08-02 LAB — URINE MICROSCOPIC-ADD ON: RBC / HPF: NONE SEEN RBC/hpf (ref 0–5)

## 2016-08-02 LAB — COMPREHENSIVE METABOLIC PANEL
ALK PHOS: 76 U/L (ref 38–126)
ALT: 13 U/L — AB (ref 14–54)
ANION GAP: 5 (ref 5–15)
AST: 18 U/L (ref 15–41)
Albumin: 3 g/dL — ABNORMAL LOW (ref 3.5–5.0)
BILIRUBIN TOTAL: 1.4 mg/dL — AB (ref 0.3–1.2)
BUN: 16 mg/dL (ref 6–20)
CALCIUM: 8.4 mg/dL — AB (ref 8.9–10.3)
CO2: 26 mmol/L (ref 22–32)
CREATININE: 0.65 mg/dL (ref 0.44–1.00)
Chloride: 103 mmol/L (ref 101–111)
Glucose, Bld: 243 mg/dL — ABNORMAL HIGH (ref 65–99)
Potassium: 3.9 mmol/L (ref 3.5–5.1)
SODIUM: 134 mmol/L — AB (ref 135–145)
TOTAL PROTEIN: 5.5 g/dL — AB (ref 6.5–8.1)

## 2016-08-02 LAB — TSH: TSH: 3.264 u[IU]/mL (ref 0.350–4.500)

## 2016-08-02 LAB — AMMONIA: Ammonia: 93 umol/L — ABNORMAL HIGH (ref 9–35)

## 2016-08-02 LAB — APTT: APTT: 37 s — AB (ref 24–36)

## 2016-08-02 LAB — PROTIME-INR
INR: 1.19
Prothrombin Time: 15.1 seconds (ref 11.4–15.2)

## 2016-08-02 MED ORDER — INSULIN DETEMIR 100 UNIT/ML ~~LOC~~ SOLN
50.0000 [IU] | Freq: Every day | SUBCUTANEOUS | Status: DC
  Administered 2016-08-03 – 2016-08-05 (×3): 50 [IU] via SUBCUTANEOUS
  Filled 2016-08-02 (×5): qty 0.5

## 2016-08-02 MED ORDER — HEPARIN SODIUM (PORCINE) 5000 UNIT/ML IJ SOLN
5000.0000 [IU] | Freq: Three times a day (TID) | INTRAMUSCULAR | Status: DC
  Administered 2016-08-02 – 2016-08-06 (×11): 5000 [IU] via SUBCUTANEOUS
  Filled 2016-08-02 (×11): qty 1

## 2016-08-02 MED ORDER — LACTULOSE 10 GM/15ML PO SOLN
30.0000 g | Freq: Three times a day (TID) | ORAL | Status: DC
  Administered 2016-08-02 – 2016-08-03 (×3): 30 g via ORAL
  Filled 2016-08-02 (×3): qty 60

## 2016-08-02 MED ORDER — RIFAXIMIN 550 MG PO TABS
550.0000 mg | ORAL_TABLET | Freq: Two times a day (BID) | ORAL | Status: DC
  Administered 2016-08-02 – 2016-08-06 (×8): 550 mg via ORAL
  Filled 2016-08-02 (×8): qty 1

## 2016-08-02 MED ORDER — DEXTROSE 5 % IV SOLN
1.0000 g | Freq: Once | INTRAVENOUS | Status: AC
  Administered 2016-08-02: 1 g via INTRAVENOUS
  Filled 2016-08-02: qty 10

## 2016-08-02 MED ORDER — ALPRAZOLAM 0.5 MG PO TABS
0.5000 mg | ORAL_TABLET | Freq: Two times a day (BID) | ORAL | Status: DC
  Administered 2016-08-02 – 2016-08-04 (×4): 0.5 mg via ORAL
  Filled 2016-08-02 (×4): qty 1

## 2016-08-02 MED ORDER — ACETAMINOPHEN 325 MG PO TABS
650.0000 mg | ORAL_TABLET | Freq: Four times a day (QID) | ORAL | Status: DC | PRN
  Administered 2016-08-05: 650 mg via ORAL
  Filled 2016-08-02: qty 2

## 2016-08-02 MED ORDER — SODIUM CHLORIDE 0.9% FLUSH
3.0000 mL | Freq: Two times a day (BID) | INTRAVENOUS | Status: DC
Start: 2016-08-02 — End: 2016-08-06
  Administered 2016-08-02 – 2016-08-05 (×7): 3 mL via INTRAVENOUS

## 2016-08-02 MED ORDER — PREGABALIN 50 MG PO CAPS
100.0000 mg | ORAL_CAPSULE | Freq: Two times a day (BID) | ORAL | Status: DC
  Administered 2016-08-02 – 2016-08-06 (×8): 100 mg via ORAL
  Filled 2016-08-02 (×8): qty 2

## 2016-08-02 MED ORDER — OXYCODONE HCL 5 MG PO TABS
5.0000 mg | ORAL_TABLET | Freq: Four times a day (QID) | ORAL | Status: DC | PRN
  Administered 2016-08-03 – 2016-08-06 (×6): 5 mg via ORAL
  Filled 2016-08-02 (×6): qty 1

## 2016-08-02 MED ORDER — BISACODYL 5 MG PO TBEC: 5.0000 mg | DELAYED_RELEASE_TABLET | Freq: Every day | ORAL | Status: DC | PRN

## 2016-08-02 MED ORDER — ONDANSETRON HCL 4 MG PO TABS: 4.0000 mg | ORAL_TABLET | Freq: Four times a day (QID) | ORAL | Status: DC | PRN

## 2016-08-02 MED ORDER — URSODIOL 500 MG PO TABS
500.0000 mg | ORAL_TABLET | Freq: Two times a day (BID) | ORAL | Status: DC
  Filled 2016-08-02: qty 1

## 2016-08-02 MED ORDER — ASPIRIN EC 81 MG PO TBEC
81.0000 mg | DELAYED_RELEASE_TABLET | Freq: Every day | ORAL | Status: DC
  Administered 2016-08-03 – 2016-08-06 (×4): 81 mg via ORAL
  Filled 2016-08-02 (×4): qty 1

## 2016-08-02 MED ORDER — DEXTROSE 5 % IV SOLN
1.0000 g | INTRAVENOUS | Status: DC
  Administered 2016-08-03 – 2016-08-04 (×2): 1 g via INTRAVENOUS
  Filled 2016-08-02 (×3): qty 10

## 2016-08-02 MED ORDER — ONDANSETRON HCL 4 MG/2ML IJ SOLN: 4.0000 mg | Freq: Four times a day (QID) | INTRAMUSCULAR | Status: DC | PRN

## 2016-08-02 MED ORDER — INSULIN ASPART 100 UNIT/ML ~~LOC~~ SOLN
0.0000 [IU] | Freq: Every day | SUBCUTANEOUS | Status: DC
  Administered 2016-08-04: 3 [IU] via SUBCUTANEOUS
  Administered 2016-08-05: 2 [IU] via SUBCUTANEOUS

## 2016-08-02 MED ORDER — MOMETASONE FURO-FORMOTEROL FUM 200-5 MCG/ACT IN AERO
2.0000 | INHALATION_SPRAY | Freq: Two times a day (BID) | RESPIRATORY_TRACT | Status: DC
  Administered 2016-08-03 – 2016-08-06 (×6): 2 via RESPIRATORY_TRACT
  Filled 2016-08-02: qty 8.8

## 2016-08-02 MED ORDER — INSULIN ASPART 100 UNIT/ML ~~LOC~~ SOLN
4.0000 [IU] | Freq: Three times a day (TID) | SUBCUTANEOUS | Status: DC
  Administered 2016-08-03 – 2016-08-06 (×11): 4 [IU] via SUBCUTANEOUS

## 2016-08-02 MED ORDER — DIPHENHYDRAMINE HCL 25 MG PO CAPS
25.0000 mg | ORAL_CAPSULE | Freq: Four times a day (QID) | ORAL | Status: DC | PRN
  Filled 2016-08-02: qty 1

## 2016-08-02 MED ORDER — FUROSEMIDE 20 MG PO TABS
20.0000 mg | ORAL_TABLET | Freq: Every morning | ORAL | Status: DC
  Administered 2016-08-03 – 2016-08-06 (×4): 20 mg via ORAL
  Filled 2016-08-02 (×4): qty 1

## 2016-08-02 MED ORDER — ACETAMINOPHEN 650 MG RE SUPP
650.0000 mg | Freq: Four times a day (QID) | RECTAL | Status: DC | PRN
Start: 2016-08-02 — End: 2016-08-06

## 2016-08-02 MED ORDER — SPIRONOLACTONE 25 MG PO TABS
50.0000 mg | ORAL_TABLET | Freq: Every day | ORAL | Status: DC
  Administered 2016-08-03 – 2016-08-05 (×3): 50 mg via ORAL
  Filled 2016-08-02 (×4): qty 2

## 2016-08-02 MED ORDER — POLYETHYLENE GLYCOL 3350 17 G PO PACK: 17.0000 g | PACK | Freq: Every day | ORAL | Status: DC | PRN

## 2016-08-02 MED ORDER — SODIUM CHLORIDE 0.9 % IV SOLN: 250.0000 mL | INTRAVENOUS | Status: DC | PRN

## 2016-08-02 MED ORDER — SODIUM CHLORIDE 0.9 % IV BOLUS (SEPSIS)
1000.0000 mL | Freq: Once | INTRAVENOUS | Status: AC
Start: 2016-08-02 — End: 2016-08-02
  Administered 2016-08-02: 1000 mL via INTRAVENOUS

## 2016-08-02 MED ORDER — PANTOPRAZOLE SODIUM 40 MG PO TBEC
80.0000 mg | DELAYED_RELEASE_TABLET | Freq: Every day | ORAL | Status: DC
  Administered 2016-08-03 – 2016-08-06 (×4): 80 mg via ORAL
  Filled 2016-08-02 (×4): qty 2

## 2016-08-02 MED ORDER — SODIUM CHLORIDE 0.9% FLUSH: 3.0000 mL | INTRAVENOUS | Status: DC | PRN

## 2016-08-02 MED ORDER — INSULIN ASPART 100 UNIT/ML ~~LOC~~ SOLN
0.0000 [IU] | Freq: Three times a day (TID) | SUBCUTANEOUS | Status: DC
  Administered 2016-08-03: 5 [IU] via SUBCUTANEOUS
  Administered 2016-08-03 (×2): 3 [IU] via SUBCUTANEOUS
  Administered 2016-08-04: 5 [IU] via SUBCUTANEOUS
  Administered 2016-08-04: 3 [IU] via SUBCUTANEOUS
  Administered 2016-08-04: 5 [IU] via SUBCUTANEOUS
  Administered 2016-08-05: 8 [IU] via SUBCUTANEOUS
  Administered 2016-08-05: 5 [IU] via SUBCUTANEOUS
  Administered 2016-08-05: 8 [IU] via SUBCUTANEOUS
  Administered 2016-08-06: 3 [IU] via SUBCUTANEOUS
  Administered 2016-08-06: 8 [IU] via SUBCUTANEOUS

## 2016-08-02 MED ORDER — ENALAPRIL MALEATE 5 MG PO TABS
5.0000 mg | ORAL_TABLET | Freq: Every day | ORAL | Status: DC
  Administered 2016-08-03 – 2016-08-06 (×4): 5 mg via ORAL
  Filled 2016-08-02 (×4): qty 1

## 2016-08-02 NOTE — ED Triage Notes (Signed)
Per EMS patient from home with altered level of consciousness for past three days. States history cirossis of the liver. Patient states that she has been feeling off all day. Family and home health nurse states that she has been feeling this way all day. Patient alert x 4 but lethargic.

## 2016-08-02 NOTE — Progress Notes (Signed)
Pharmacy Note:  Rocephin ordered for UTI. No pharmacokinetic or renal dosing needed.  Plan: Rocephin 1gm IV every 24 hours. Sign off.  Pricilla Larsson, [facility] 08/02/2016 8:54 PM

## 2016-08-02 NOTE — ED Provider Notes (Signed)
[facility] DEPT Provider Note   CSN: [id number] Arrival date & time: 08/02/16  1404     History   Chief Complaint Chief Complaint  Patient presents with  . Altered Mental Status    HPI [name] [name] is a 75 y.o. female.  The history is provided by the patient and the spouse.  Altered Mental Status   This is a recurrent problem. The current episode started yesterday. The problem has been gradually worsening. Associated symptoms include confusion, somnolence and unresponsiveness. Her past medical history does not include seizures, hypertension, COPD or depression.    Past Medical History:  Diagnosis Date  . Abnormal EKG    hx of left anterior fasicular block on 04-09-13 ekg epic  . Allergic rhinitis, cause unspecified   . Bilateral leg weakness   . CAD in native artery   . Chronic gastritis 03/09/11   egd by Dr. Gala Romney  . Chronic shoulder pain   . Cirrhosis ([place])   . Cirrhosis of liver (HCC)    NASH, afp on 06/17/12 =3.7, per pt she had hep B vaccines in 1996, hep A in process.   . Colon adenomas 03/08/10   tcs by Dr. Gala Romney  . Diverticula of colon 03/08/10   L side  . Esophagitis, erosive 03/08/10  . GERD (gastroesophageal reflux disease)   . History of hemorrhoids 03/08/10   tcs- internal and external  . Hyperplastic polyps of stomach 03/08/10   tcs by Dr. Dudley Major  . Intrinsic asthma, unspecified   . Obesity, unspecified   . Other and unspecified hyperlipidemia   . Pancreatitis   . Pancytopenia, acquired ([employer]) 04/12/2014  . Personal history of neurosis   . Thrombocytopenia ([place]) 04/12/2014  . Thrombocytopenia, unspecified ([place])   . Type II or unspecified type diabetes mellitus without mention of complication, not stated as uncontrolled   . Unspecified essential hypertension   . Unspecified fall   . Unspecified hereditary and idiopathic peripheral neuropathy   . Vertigo   . Wears glasses     Patient Active Problem List   Diagnosis Date Noted  . Uncontrolled type 2  diabetes mellitus with complication, with long-term current use of insulin ([name]) 04/23/2016  . UTI (lower urinary tract infection)   . Fall 12/24/2014  . Encounter for examination for admission to nursing home 12/16/2014  . Hyperammonemia ([name]) 12/16/2014  . Weakness 12/15/2014  . Physical deconditioning 08/18/2014  . Morbid obesity ([name]) 08/18/2014  . Elevated LFTs 07/29/2014  . Left-sided weakness 07/12/2014  . Pancreatitis, acute 04/12/2014  . Pancytopenia, acquired ([name]) 04/12/2014  . Poor balance 10/14/2013  . Encephalopathy, hepatic ([name]) 07/30/2013  . Confusion 07/29/2013  . Edema of left lower extremity 04/14/2013  . Generalized weakness 02/19/2013  . UTI (urinary tract infection) 02/14/2013  . Fracture of humeral shaft, right, closed 02/11/2013  . Multiple falls 01/26/2013  . Difficulty walking 01/26/2013  . Dizziness 09/02/2012  . Frequent falls 08/08/2012  . Ataxia 08/08/2012  . Peripheral neuropathy ([name]) 08/08/2012  . 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 Anemia 04/08/2012  . Posterior tibial tendon dysfunction 07/12/2011  . Hyperlipidemia 05/31/2011  . Essential hypertension 05/31/2011  . GERD 09/29/2010  . Liver cirrhosis secondary to NASH 09/29/2010  . History of colonic polyps 02/13/2010  . THROMBOCYTOPENIA, CHRONIC 01/04/2010  . PERIPHERAL NEUROPATHY 01/04/2010  . CAD, NATIVE VESSEL 01/04/2010  . ANXIETY DISORDER, HX OF 01/04/2010  . Intrinsic asthma, unspecified 03/16/2009  . ALLERGIC RHINITIS 02/18/2009  Past Surgical History:  Procedure Laterality Date  . ABDOMINAL HYSTERECTOMY    . BREAST SURGERY Right few yrs ago   benign area removed  . CARDIAC CATHETERIZATION  02/25/2007   Dr. [name]:  normal LV systolic function, mild irregularities of LAD  . COLONOSCOPY  03/08/10   Dr. [name]-->ext/int hemorrhoids, anal paipilla, rectal polyps, desc polyps, cecal polyp, left-sided diverticula. suboptiomal prep. next TCS 02/2015. multiple  adenomas  . ESOPHAGOGASTRODUODENOSCOPY  03/08/10   Dr. [name] erosive RE, small hh, antral erosions, small 1cm are of mucosal indentation along gastric body of doubtful significance, cystic nodularity of hypophyarynx, base of tongue, base of epiglottis. benign gastric biopsies  . ESOPHAGOGASTRODUODENOSCOPY  10/08/2011   Dr. [name] esophageal varices, antral erosion. next egd 09/2013  . ESOPHAGOGASTRODUODENOSCOPY N/A 02/17/2014   Dr. Gala Romney: portal gastropathy, no varices. Due for surveillance 2017  . EYE SURGERY     cataracts bilateral  . FOOT SURGERY  2003   lt foot  . hysterectomy and btl     s/p  . ORIF HUMERUS FRACTURE Right 02/17/2013   Procedure: OPEN REDUCTION INTERNAL FIXATION (ORIF) RIGHT PROXIMAL HUMERUS FRACTURE;  Surgeon: [name], MD;  Location: [facility];  Service: Orthopedics;  Laterality: Right;  . PARTIAL GASTRECTOMY     family denies  . TUBAL LIGATION    . TUMOR EXCISION  2003   rt arm and left foot    OB History    No data available       Home Medications    Prior to Admission medications   Medication Sig Start Date End Date Taking? Authorizing Provider  ALPRAZolam Duanne Moron) 0.5 MG tablet Take 1 tablet (0.5 mg total) by mouth 3 (three) times daily as needed for anxiety. Patient taking differently: Take 0.5 mg by mouth 2 (two) times daily.  12/24/14  Yes [name], MD  aspirin EC 81 MG tablet Take 81 mg by mouth daily.   Yes Historical Provider, MD  budesonide-formoterol (SYMBICORT) 160-4.5 MCG/ACT inhaler Inhale 2 puffs into the lungs at bedtime.    Yes Historical Provider, MD  diphenhydrAMINE (BENADRYL) 25 MG tablet Take 25 mg by mouth every 6 (six) hours as needed.   Yes Historical Provider, MD  enalapril (VASOTEC) 5 MG tablet Take 1 tablet by mouth daily. 08/25/14  Yes Historical Provider, MD  ESTRACE VAGINAL 0.1 MG/GM vaginal cream Place 1 Applicatorful vaginally every other day. At  bedtime 08/11/13  Yes Historical Provider, MD  furosemide  (LASIX) 20 MG tablet Take 1 tablet (20 mg total) by mouth every morning. Patient taking differently: Take 20 mg by mouth daily as needed for fluid.  09/03/14  Yes Mahala Menghini, PA-C  HUMALOG KWIKPEN 100 UNIT/ML KiwkPen INJECT 10-16 UNITS INTO THE SKIN THREE TIMES DAILY 07/12/16  Yes Cassandria Anger, MD  lactulose (CHRONULAC) 10 GM/15ML solution Take 20 g by mouth 3 (three) times daily.  10/20/14  Yes Historical Provider, MD  LEVEMIR FLEXTOUCH 100 UNIT/ML Pen INJECT 50 UNITS INTO THE SKIN DAILY AT 10PM 04/11/16  Yes Cassandria Anger, MD  Multiple Vitamins-Minerals (CENTRUM SILVER PO) Take 1 tablet by mouth daily.    Yes Historical Provider, MD  NEXIUM 40 MG capsule Take 40 mg by mouth every morning.  06/17/14  Yes Historical Provider, MD  ONETOUCH VERIO test strip USE UP TO FOUR TIMES DAILY AS DIRECTED 07/02/16  Yes Cassandria Anger, MD  oxyCODONE (OXY IR/ROXICODONE) 5 MG immediate release tablet Take 5 mg by mouth 2 (two)  times daily as needed for moderate pain or severe pain.  03/16/16  Yes Historical Provider, MD  pregabalin (LYRICA) 100 MG capsule Take 100 mg by mouth 2 (two) times daily.   Yes Historical Provider, MD  rifaximin (XIFAXAN) 550 MG TABS tablet Take 1 tablet (550 mg total) by mouth 2 (two) times daily. 06/28/14  Yes Kathie Dike, MD  spironolactone (ALDACTONE) 50 MG tablet TAKE ONE TABLET BY MOUTH ONCE DAILY IN THE MORNING 03/19/16  Yes Mahala Menghini, PA-C  triamcinolone cream (KENALOG) 0.1 % Apply 1 application topically daily as needed (for irritation).  06/26/13  Yes Historical Provider, MD  ursodiol (URSO FORTE) 500 MG tablet Take 500 mg by mouth 2 (two) times daily.   Yes Historical Provider, MD  cephALEXin (KEFLEX) 500 MG capsule Take 1 capsule (500 mg total) by mouth 4 (four) times daily. Patient not taking: Reported on 08/02/2016 03/26/16   Milton Ferguson, MD    Family History Family History  Problem Relation Age of Onset  . Coronary artery disease      FH  .  Diabetes      FH  . Heart disease Mother   . Colon cancer Neg Hx     Social History Social History  Substance Use Topics  . Smoking status: Never Smoker  . Smokeless tobacco: Never Used  . Alcohol use No     Allergies   Morphine and related and Compazine [prochlorperazine edisylate]   Review of Systems Review of Systems  Constitutional: Negative for chills and fever.  HENT: Negative for congestion and ear pain.   Eyes: Negative for pain.  Respiratory: Negative for cough and shortness of breath.   Cardiovascular: Negative for chest pain and palpitations.  Psychiatric/Behavioral: Positive for confusion and sleep disturbance.  All other systems reviewed and are negative.    Physical Exam Updated Vital Signs BP (!) 117/54   Pulse 65   Temp 98.9 F (37.2 C) (Oral)   Resp 16   Ht 5\' 8"  (1.727 m)   Wt 242 lb (109.8 kg)   SpO2 100%   BMI 36.80 kg/m   Physical Exam  Constitutional: She appears well-developed and well-nourished. No distress.  HENT:  Head: Normocephalic and atraumatic.  Eyes: Conjunctivae are normal.  Neck: Neck supple.  Cardiovascular: Normal rate and regular rhythm.   No murmur heard. Pulmonary/Chest: Effort normal and breath sounds normal. No respiratory distress.  Abdominal: Soft. There is no tenderness.  Musculoskeletal: She exhibits no edema.  Neurological: She is alert.  Skin: Skin is warm and dry.  Psychiatric: She has a normal mood and affect.  Falls asleepy while examining, poor historian otherwise.   Nursing note and vitals reviewed.    ED Treatments / Results  Labs (all labs ordered are listed, but only abnormal results are displayed) Labs Reviewed  CBC WITH DIFFERENTIAL/PLATELET - Abnormal; Notable for the following:       Result Value   WBC 3.7 (*)    Hemoglobin 11.7 (*)    Platelets 116 (*)    All other components within normal limits  COMPREHENSIVE METABOLIC PANEL - Abnormal; Notable for the following:    Sodium 134 (*)      Glucose, Bld 243 (*)    Calcium 8.4 (*)    Total Protein 5.5 (*)    Albumin 3.0 (*)    ALT 13 (*)    Total Bilirubin 1.4 (*)    All other components within normal limits  URINALYSIS, ROUTINE W REFLEX MICROSCOPIC (  NOT AT [facility]) - Abnormal; Notable for the following:    Glucose, UA 250 (*)    Nitrite POSITIVE (*)    Leukocytes, UA SMALL (*)    All other components within normal limits  AMMONIA - Abnormal; Notable for the following:    Ammonia 93 (*)    All other components within normal limits  URINE MICROSCOPIC-ADD ON - Abnormal; Notable for the following:    Squamous Epithelial / LPF 0-5 (*)    Bacteria, UA MANY (*)    All other components within normal limits  URINE CULTURE    EKG  EKG Interpretation None       Radiology Dg Chest 2 View  Result Date: 08/02/2016 CLINICAL DATA:  Altered level of consciousness for 3-4 days. History cirrhosis. EXAM: CHEST  2 VIEW COMPARISON:  PA and lateral chest 03/26/2016. Single view of the chest 03/10/2016. FINDINGS: There is cardiomegaly and mild vascular congestion without frank edema. No pneumothorax or pleural effusion. Fracture fixation hardware in the right humerus with nonunion of a fracture are noted and unchanged. IMPRESSION: Cardiomegaly and mild vascular congestion.  No acute disease. Electronically Signed   By: Inge Rise M.D.   On: 08/02/2016 15:49    Procedures Procedures (including critical care time)  Medications Ordered in ED Medications  cefTRIAXone (ROCEPHIN) 1 g in dextrose 5 % 50 mL IVPB (1 g Intravenous New Bag/Given 08/02/16 1721)  sodium chloride 0.9 % bolus 1,000 mL (1,000 mLs Intravenous New Bag/Given 08/02/16 1446)     Initial Impression / Assessment and Plan / ED Course  I have reviewed the triage vital signs and the nursing notes.  Pertinent labs & imaging results that were available during my care of the patient were reviewed by me and considered in my medical decision making (see chart for  details).  Clinical Course    75 year old female here with hepatic encephalopathy and UTI is also contributing. She's been taking her lactulose actually increased her dose was recently her ammonia was in the 150s. Patient is very sleepy on exam but otherwise no new neurologic findings. Rocephin and fluids given. Fairly secondary to patient's multiple comorbidities and both these problems is more appropriate to manage her in the hospital rather than as an outpatient so we'll admit to hospitalist for the same.   Final Clinical Impressions(s) / ED Diagnoses   Final diagnoses:  Hepatic encephalopathy ([place])  UTI (lower urinary tract infection)    New Prescriptions New Prescriptions   No medications on file     Merrily Pew, MD 08/02/16 2029

## 2016-08-02 NOTE — H&P (Signed)
History and Physical    [name] [name] [id number] DOB: 02-Oct-1941 DOA: 08/02/2016  PCP: Purvis Kilts, MD   Patient coming from: Home  Chief Complaint: Altered mental status   HPI: [name] [name] is a 75 y.o. female with medical history significant for liver cirrhosis, hypertension, insulin-dependent diabetes mellitus, and chronic pancytopenia who presents the emergency department with altered mental status of 3 days' duration. Patient is accompanied by family who provides much of the history. History is also going from discussion with the ED personnel and review of the EMR. Patient had reportedly been in her usual state of health until she was noted to be increasingly lethargic and confused by family approximately 3 days ago. Patient is not had a fever, apparent respiratory distress, or voiced any specific complaints. There has been no facial droop or focal weakness observed. Per the report of the family and review of EMR, the patient has had similar symptoms multiple times in the past, typically when her ammonia level is elevated or she has a UTI. She takes lactulose and rifaximin at home and has history of UTI with enterococcus, MDR Escherichia coli, and VRE. She has history of MRSA carriage. She has been taking all of her medications as directed, there has been no drug or alcohol use, and no recent trauma.  ED Course: Upon arrival to the ED, patient is found to be afebrile, saturating well on room air, and with vital signs stable. EKG demonstrates a sinus rhythm with incomplete right bundle branch block and chest x-ray is notable for cardiomegaly with mild pulmonary vascular congestion. CMP features a mild hyponatremia and a serum glucose of 243. Ammonia level is elevated to 93. CBC features a pancytopenia with WBC 3700, hemoglobin 11.7, and platelet count 116,000. Urinalysis features many bacteria, small leukocytes, positive nitrite, and 0-5 WBC. The patient was given 1 L normal saline  bolus in the emergency department and treated with empiric Rocephin. She remained hemodynamically stable in the ED and in no respiratory distress. She will be observed on the medical/surgical unit for ongoing evaluation and management of acute encephalopathy, suspect to be multifactorial with contributions from hepatic encephalopathy and UTI.  Review of Systems:  Unable to obtain ROS secondary to the patient's clinical condition with acute encephalopathy.  Past Medical History:  Diagnosis Date  . Abnormal EKG    hx of left anterior fasicular block on 04-09-13 ekg epic  . Allergic rhinitis, cause unspecified   . Bilateral leg weakness   . CAD in native artery   . Chronic gastritis 03/09/11   egd by Dr. Gala Romney  . Chronic shoulder pain   . Cirrhosis ([place])   . Cirrhosis of liver (HCC)    NASH, afp on 06/17/12 =3.7, per pt she had hep B vaccines in 1996, hep A in process.   . Colon adenomas 03/08/10   tcs by Dr. Gala Romney  . Diverticula of colon 03/08/10   L side  . Esophagitis, erosive 03/08/10  . GERD (gastroesophageal reflux disease)   . History of hemorrhoids 03/08/10   tcs- internal and external  . Hyperplastic polyps of stomach 03/08/10   tcs by Dr. Dudley Major  . Intrinsic asthma, unspecified   . Obesity, unspecified   . Other and unspecified hyperlipidemia   . Pancreatitis   . Pancytopenia, acquired ([place]) 04/12/2014  . Personal history of neurosis   . Thrombocytopenia ([place]) 04/12/2014  . Thrombocytopenia, unspecified ([place])   . Type II or unspecified type diabetes mellitus without mention of  complication, not stated as uncontrolled   . Unspecified essential hypertension   . Unspecified fall   . Unspecified hereditary and idiopathic peripheral neuropathy   . Vertigo   . Wears glasses     Past Surgical History:  Procedure Laterality Date  . ABDOMINAL HYSTERECTOMY    . BREAST SURGERY Right few yrs ago   benign area removed  . CARDIAC CATHETERIZATION  02/25/2007   Dr. [name]:  normal LV  systolic function, mild irregularities of LAD  . COLONOSCOPY  03/08/10   Dr. [name]-->ext/int hemorrhoids, anal paipilla, rectal polyps, desc polyps, cecal polyp, left-sided diverticula. suboptiomal prep. next TCS 02/2015. multiple adenomas  . ESOPHAGOGASTRODUODENOSCOPY  03/08/10   Dr. [name] erosive RE, small hh, antral erosions, small 1cm are of mucosal indentation along gastric body of doubtful significance, cystic nodularity of hypophyarynx, base of tongue, base of epiglottis. benign gastric biopsies  . ESOPHAGOGASTRODUODENOSCOPY  10/08/2011   Dr. [name] esophageal varices, antral erosion. next egd 09/2013  . ESOPHAGOGASTRODUODENOSCOPY N/A 02/17/2014   Dr. Gala Romney: portal gastropathy, no varices. Due for surveillance 2017  . EYE SURGERY     cataracts bilateral  . FOOT SURGERY  2003   lt foot  . hysterectomy and btl     s/p  . ORIF HUMERUS FRACTURE Right 02/17/2013   Procedure: OPEN REDUCTION INTERNAL FIXATION (ORIF) RIGHT PROXIMAL HUMERUS FRACTURE;  Surgeon: [name], MD;  Location: [facility];  Service: Orthopedics;  Laterality: Right;  . PARTIAL GASTRECTOMY     family denies  . TUBAL LIGATION    . TUMOR EXCISION  2003   rt arm and left foot     reports that she has never smoked. She has never used smokeless tobacco. She reports that she does not drink alcohol or use drugs.  Allergies  Allergen Reactions  . Morphine And Related Itching  . Compazine [Prochlorperazine Edisylate] Anxiety    Causes anxiety & nervousness    Family History  Problem Relation Age of Onset  . Coronary artery disease      FH  . Diabetes      FH  . Heart disease Mother   . Colon cancer Neg Hx      Prior to Admission medications   Medication Sig Start Date End Date Taking? Authorizing Provider  ALPRAZolam Duanne Moron) 0.5 MG tablet Take 1 tablet (0.5 mg total) by mouth 3 (three) times daily as needed for anxiety. Patient taking differently: Take 0.5 mg by mouth 2 (two) times daily.  12/24/14  Yes  [name], MD  aspirin EC 81 MG tablet Take 81 mg by mouth daily.   Yes Historical Provider, MD  budesonide-formoterol (SYMBICORT) 160-4.5 MCG/ACT inhaler Inhale 2 puffs into the lungs at bedtime.    Yes Historical Provider, MD  diphenhydrAMINE (BENADRYL) 25 MG tablet Take 25 mg by mouth every 6 (six) hours as needed.   Yes Historical Provider, MD  enalapril (VASOTEC) 5 MG tablet Take 1 tablet by mouth daily. 08/25/14  Yes Historical Provider, MD  ESTRACE VAGINAL 0.1 MG/GM vaginal cream Place 1 Applicatorful vaginally every other day. At  bedtime 08/11/13  Yes Historical Provider, MD  furosemide (LASIX) 20 MG tablet Take 1 tablet (20 mg total) by mouth every morning. Patient taking differently: Take 20 mg by mouth daily as needed for fluid.  09/03/14  Yes Mahala Menghini, PA-C  HUMALOG KWIKPEN 100 UNIT/ML KiwkPen INJECT 10-16 UNITS INTO THE SKIN THREE TIMES DAILY 07/12/16  Yes Cassandria Anger, MD  lactulose [facility])  10 GM/15ML solution Take 20 g by mouth 3 (three) times daily.  10/20/14  Yes Historical Provider, MD  LEVEMIR FLEXTOUCH 100 UNIT/ML Pen INJECT 50 UNITS INTO THE SKIN DAILY AT 10PM 04/11/16  Yes Cassandria Anger, MD  Multiple Vitamins-Minerals (CENTRUM SILVER PO) Take 1 tablet by mouth daily.    Yes Historical Provider, MD  NEXIUM 40 MG capsule Take 40 mg by mouth every morning.  06/17/14  Yes Historical Provider, MD  ONETOUCH VERIO test strip USE UP TO FOUR TIMES DAILY AS DIRECTED 07/02/16  Yes Cassandria Anger, MD  oxyCODONE (OXY IR/ROXICODONE) 5 MG immediate release tablet Take 5 mg by mouth 2 (two) times daily as needed for moderate pain or severe pain.  03/16/16  Yes Historical Provider, MD  pregabalin (LYRICA) 100 MG capsule Take 100 mg by mouth 2 (two) times daily.   Yes Historical Provider, MD  rifaximin (XIFAXAN) 550 MG TABS tablet Take 1 tablet (550 mg total) by mouth 2 (two) times daily. 06/28/14  Yes Kathie Dike, MD  spironolactone (ALDACTONE) 50 MG tablet TAKE  ONE TABLET BY MOUTH ONCE DAILY IN THE MORNING 03/19/16  Yes Mahala Menghini, PA-C  triamcinolone cream (KENALOG) 0.1 % Apply 1 application topically daily as needed (for irritation).  06/26/13  Yes Historical Provider, MD  ursodiol (URSO FORTE) 500 MG tablet Take 500 mg by mouth 2 (two) times daily.   Yes Historical Provider, MD  cephALEXin (KEFLEX) 500 MG capsule Take 1 capsule (500 mg total) by mouth 4 (four) times daily. Patient not taking: Reported on 08/02/2016 03/26/16   Milton Ferguson, MD    Physical Exam: Vitals:   08/02/16 1630 08/02/16 1700 08/02/16 1730 08/02/16 1800  BP: 114/57 110/61 119/67 100/60  Pulse: 66 64 64   Resp: 16  20 20   Temp:      TempSrc:      SpO2: 100% 99% 99%   Weight:      Height:          Constitutional: NAD, calm, comfortable, obese, chronically-ill appearing Eyes: PERTLA, lids and conjunctivae normal ENMT: Mucous membranes are moist. Posterior pharynx clear of any exudate or lesions.   Neck: normal, supple, no masses, no thyromegaly Respiratory: clear to auscultation bilaterally, no wheezing, no crackles. Normal respiratory effort.    Cardiovascular: S1 & S2 heard, regular rate and rhythm, grade 3 systolic murmur at apex. No carotid bruits. No significant JVD. Abdomen: No distension, no tenderness, no masses palpated. Bowel sounds normal.  Musculoskeletal: no clubbing / cyanosis. No joint deformity upper and lower extremities. Left arm tender proximally. Normal muscle tone.  Skin: no significant rashes, lesions, ulcers. Scattered angiomata. Warm, dry, well-perfused. Neurologic: CN 2-12 grossly intact. Sensation intact, DTR normal. Strength 5/5 in all 4 limbs.  Psychiatric: Alert, oriented to person, place, and situation, but not oriented month or year. Cooperative, calm.    Labs on Admission: I have personally reviewed following labs and imaging studies  CBC:  Recent Labs Lab 08/02/16 1453  WBC 3.7*  NEUTROABS 2.4  HGB 11.7*  HCT 36.3  MCV  81.0  PLT 99991111*   Basic Metabolic Panel:  Recent Labs Lab 08/02/16 1453  NA 134*  K 3.9  CL 103  CO2 26  GLUCOSE 243*  BUN 16  CREATININE 0.65  CALCIUM 8.4*   GFR: Estimated Creatinine Clearance: 81.4 mL/min (by C-G formula based on SCr of 0.65 mg/dL). Liver Function Tests:  Recent Labs Lab 08/02/16 1453  AST 18  ALT 13*  ALKPHOS 76  BILITOT 1.4*  PROT 5.5*  ALBUMIN 3.0*   No results for input(s): LIPASE, AMYLASE in the last 168 hours.  Recent Labs Lab 08/02/16 1453  AMMONIA 93*   Coagulation Profile: No results for input(s): INR, PROTIME in the last 168 hours. Cardiac Enzymes: No results for input(s): CKTOTAL, CKMB, CKMBINDEX, TROPONINI in the last 168 hours. BNP (last 3 results) No results for input(s): PROBNP in the last 8760 hours. HbA1C: No results for input(s): HGBA1C in the last 72 hours. CBG:  Recent Labs Lab 08/02/16 1944  GLUCAP 173*   Lipid Profile: No results for input(s): CHOL, HDL, LDLCALC, TRIG, CHOLHDL, LDLDIRECT in the last 72 hours. Thyroid Function Tests: No results for input(s): TSH, T4TOTAL, FREET4, T3FREE, THYROIDAB in the last 72 hours. Anemia Panel: No results for input(s): VITAMINB12, FOLATE, FERRITIN, TIBC, IRON, RETICCTPCT in the last 72 hours. Urine analysis:    Component Value Date/Time   COLORURINE YELLOW 08/02/2016 [place] 08/02/2016 1429   LABSPEC 1.020 08/02/2016 1429   PHURINE 5.5 08/02/2016 1429   GLUCOSEU 250 (A) 08/02/2016 1429   HGBUR NEGATIVE 08/02/2016 1429   BILIRUBINUR NEGATIVE 08/02/2016 1429   KETONESUR NEGATIVE 08/02/2016 1429   PROTEINUR NEGATIVE 08/02/2016 1429   UROBILINOGEN 1.0 01/18/2015 0139   NITRITE POSITIVE (A) 08/02/2016 1429   LEUKOCYTESUR SMALL (A) 08/02/2016 1429   Sepsis Labs: @LABRCNTIP (procalcitonin:4,lacticidven:4) )No results found for this or any previous visit (from the past 240 hour(s)).   Radiological Exams on Admission: Dg Chest 2 View  Result Date:  08/02/2016 CLINICAL DATA:  Altered level of consciousness for 3-4 days. History cirrhosis. EXAM: CHEST  2 VIEW COMPARISON:  PA and lateral chest 03/26/2016. Single view of the chest 03/10/2016. FINDINGS: There is cardiomegaly and mild vascular congestion without frank edema. No pneumothorax or pleural effusion. Fracture fixation hardware in the right humerus with nonunion of a fracture are noted and unchanged. IMPRESSION: Cardiomegaly and mild vascular congestion.  No acute disease. Electronically Signed   By: Inge Rise M.D.   On: 08/02/2016 15:49    EKG: Independently reviewed. Sinus rhythm, incomplete RBBB  Assessment/Plan  1. Acute encephalopathy  - Likely a toxic-metabolic encephalopathy with contributions from elevated ammonia and UTI  - There is no focal neurologic deficit or recent trauma  - Ammonia is elevated to 93; her lactulose has been increased and will be titrated to achieve 3 soft BM/day - UA suggests possible infection and will be treated empirically as below   2. UTI  - UA features bacteria, leukocyte esterase, and nitrite, but only 0-5 WBC; sample sent for culture  - She is afebrile, but leukopenic to 3700  - She was treated with empiric Rocephin in the ED  - Prior cultures have grown Enterococcus (including VRE), MDR E coli, and enterobacter  - Given that the patient is non-toxic in appearance, afebrile, and hemodynamically stable, will continue empiric coverage with Rocephin for now while awaiting culture data   3. Cirrhosis  - Ammonia elevated to 93 on admission - Lactulose increased, continue Xifaxin  - Continue Aldactone and Lasix  - EGD in April 2015 with portal gastropathy, but no varices; she is due for repeat EGD; continue PPI    4. Insuslin-dependent DM - A1c was 6.7% earlier this month  - Managed at home with Levemir 50 units qAM and Humolog 10-16 units TID with meals  - Check CBG with meals and qHS  - Continue Levemir 50 units qAM, start mealtime  coverage  with Novolog 4 units TID, and moderate-intensity sliding-scale correctional    5. Hypertension  - At goal currently  - Continue enalapril, Aldactone, and Lasix   6. GERD - EGD with portal gastropathy, no varices or esophagitis  - Continue daily PPI   7. Pancytopenia - WBC 3,700; Hgb 11.7; platelets 116,000 - This is likely secondary to cirrhosis and all indices appear to be stable relative to prior CBC's  - There is a possible infection which could be contributing and is being addressed as above   - No s/s of active blood-loss   DVT prophylaxis: sq heparin  Code Status: Full  Family Communication: Sister updated at bedside Disposition Plan: Observe on med-surg Consults called: None Admission status: Observation    Vianne Bulls, MD [facility] Pager [phone]  If 7PM-7AM, please contact night-coverage [email or web address] Password [facility]  08/02/2016, 8:37 PM

## 2016-08-03 DIAGNOSIS — R279 Unspecified lack of coordination: Secondary | ICD-10-CM | POA: Diagnosis not present

## 2016-08-03 DIAGNOSIS — K7682 Hepatic encephalopathy: Secondary | ICD-10-CM

## 2016-08-03 DIAGNOSIS — K219 Gastro-esophageal reflux disease without esophagitis: Secondary | ICD-10-CM | POA: Diagnosis not present

## 2016-08-03 DIAGNOSIS — Z7982 Long term (current) use of aspirin: Secondary | ICD-10-CM | POA: Diagnosis not present

## 2016-08-03 DIAGNOSIS — Z8249 Family history of ischemic heart disease and other diseases of the circulatory system: Secondary | ICD-10-CM | POA: Diagnosis not present

## 2016-08-03 DIAGNOSIS — K7460 Unspecified cirrhosis of liver: Secondary | ICD-10-CM | POA: Diagnosis present

## 2016-08-03 DIAGNOSIS — D61818 Other pancytopenia: Secondary | ICD-10-CM | POA: Diagnosis present

## 2016-08-03 DIAGNOSIS — Z79891 Long term (current) use of opiate analgesic: Secondary | ICD-10-CM | POA: Diagnosis not present

## 2016-08-03 DIAGNOSIS — E871 Hypo-osmolality and hyponatremia: Secondary | ICD-10-CM | POA: Diagnosis present

## 2016-08-03 DIAGNOSIS — Z833 Family history of diabetes mellitus: Secondary | ICD-10-CM | POA: Diagnosis not present

## 2016-08-03 DIAGNOSIS — G9340 Encephalopathy, unspecified: Secondary | ICD-10-CM | POA: Diagnosis not present

## 2016-08-03 DIAGNOSIS — K7581 Nonalcoholic steatohepatitis (NASH): Secondary | ICD-10-CM | POA: Diagnosis not present

## 2016-08-03 DIAGNOSIS — Z6836 Body mass index (BMI) 36.0-36.9, adult: Secondary | ICD-10-CM | POA: Diagnosis not present

## 2016-08-03 DIAGNOSIS — K7290 Hepatic failure, unspecified without coma: Secondary | ICD-10-CM

## 2016-08-03 DIAGNOSIS — E1165 Type 2 diabetes mellitus with hyperglycemia: Secondary | ICD-10-CM | POA: Diagnosis present

## 2016-08-03 DIAGNOSIS — I2510 Atherosclerotic heart disease of native coronary artery without angina pectoris: Secondary | ICD-10-CM | POA: Diagnosis present

## 2016-08-03 DIAGNOSIS — B961 Klebsiella pneumoniae [K. pneumoniae] as the cause of diseases classified elsewhere: Secondary | ICD-10-CM | POA: Diagnosis present

## 2016-08-03 DIAGNOSIS — J45909 Unspecified asthma, uncomplicated: Secondary | ICD-10-CM | POA: Diagnosis present

## 2016-08-03 DIAGNOSIS — Z7951 Long term (current) use of inhaled steroids: Secondary | ICD-10-CM | POA: Diagnosis not present

## 2016-08-03 DIAGNOSIS — D72819 Decreased white blood cell count, unspecified: Secondary | ICD-10-CM | POA: Diagnosis not present

## 2016-08-03 DIAGNOSIS — E785 Hyperlipidemia, unspecified: Secondary | ICD-10-CM | POA: Diagnosis present

## 2016-08-03 DIAGNOSIS — I10 Essential (primary) hypertension: Secondary | ICD-10-CM | POA: Diagnosis not present

## 2016-08-03 DIAGNOSIS — Z7401 Bed confinement status: Secondary | ICD-10-CM | POA: Diagnosis not present

## 2016-08-03 DIAGNOSIS — R600 Localized edema: Secondary | ICD-10-CM | POA: Diagnosis not present

## 2016-08-03 DIAGNOSIS — Z23 Encounter for immunization: Secondary | ICD-10-CM | POA: Diagnosis not present

## 2016-08-03 DIAGNOSIS — Z794 Long term (current) use of insulin: Secondary | ICD-10-CM | POA: Diagnosis not present

## 2016-08-03 DIAGNOSIS — N390 Urinary tract infection, site not specified: Secondary | ICD-10-CM | POA: Diagnosis present

## 2016-08-03 DIAGNOSIS — R4182 Altered mental status, unspecified: Secondary | ICD-10-CM | POA: Diagnosis present

## 2016-08-03 DIAGNOSIS — Z87440 Personal history of urinary (tract) infections: Secondary | ICD-10-CM | POA: Diagnosis not present

## 2016-08-03 LAB — CBC WITH DIFFERENTIAL/PLATELET
BASOS ABS: 0 10*3/uL (ref 0.0–0.1)
BASOS PCT: 1 %
EOS PCT: 4 %
Eosinophils Absolute: 0.1 10*3/uL (ref 0.0–0.7)
HCT: 34.7 % — ABNORMAL LOW (ref 36.0–46.0)
Hemoglobin: 11.1 g/dL — ABNORMAL LOW (ref 12.0–15.0)
Lymphocytes Relative: 32 %
Lymphs Abs: 0.8 10*3/uL (ref 0.7–4.0)
MCH: 25.9 pg — ABNORMAL LOW (ref 26.0–34.0)
MCHC: 32 g/dL (ref 30.0–36.0)
MCV: 81.1 fL (ref 78.0–100.0)
MONO ABS: 0.2 10*3/uL (ref 0.1–1.0)
MONOS PCT: 9 %
Neutro Abs: 1.4 10*3/uL — ABNORMAL LOW (ref 1.7–7.7)
Neutrophils Relative %: 55 %
PLATELETS: 106 10*3/uL — AB (ref 150–400)
RBC: 4.28 MIL/uL (ref 3.87–5.11)
RDW: 14.9 % (ref 11.5–15.5)
WBC: 2.5 10*3/uL — ABNORMAL LOW (ref 4.0–10.5)

## 2016-08-03 LAB — COMPREHENSIVE METABOLIC PANEL
ALBUMIN: 2.8 g/dL — AB (ref 3.5–5.0)
ALK PHOS: 80 U/L (ref 38–126)
ALT: 11 U/L — AB (ref 14–54)
AST: 19 U/L (ref 15–41)
Anion gap: 5 (ref 5–15)
BILIRUBIN TOTAL: 1.5 mg/dL — AB (ref 0.3–1.2)
BUN: 15 mg/dL (ref 6–20)
CALCIUM: 8.5 mg/dL — AB (ref 8.9–10.3)
CO2: 24 mmol/L (ref 22–32)
CREATININE: 0.58 mg/dL (ref 0.44–1.00)
Chloride: 106 mmol/L (ref 101–111)
GFR calc Af Amer: >60 mL/min (ref >60)
GFR calc non Af Amer: >60 mL/min (ref >60)
GLUCOSE: 215 mg/dL — AB (ref 65–99)
Potassium: 3.9 mmol/L (ref 3.5–5.1)
Sodium: 135 mmol/L (ref 135–145)
TOTAL PROTEIN: 5.1 g/dL — AB (ref 6.5–8.1)

## 2016-08-03 LAB — GLUCOSE, CAPILLARY
GLUCOSE-CAPILLARY: 191 mg/dL — AB (ref 65–99)
Glucose-Capillary: 195 mg/dL — ABNORMAL HIGH (ref 65–99)
Glucose-Capillary: 219 mg/dL — ABNORMAL HIGH (ref 65–99)
Glucose-Capillary: 200 mg/dL — ABNORMAL HIGH (ref 65–99)

## 2016-08-03 LAB — AMMONIA: Ammonia: 86 umol/L — ABNORMAL HIGH (ref 9–35)

## 2016-08-03 LAB — MRSA PCR SCREENING: MRSA BY PCR: NEGATIVE

## 2016-08-03 MED ORDER — LACTULOSE 10 GM/15ML PO SOLN
30.0000 g | Freq: Four times a day (QID) | ORAL | Status: DC
  Administered 2016-08-03 – 2016-08-04 (×3): 30 g via ORAL
  Filled 2016-08-03 (×4): qty 60

## 2016-08-03 MED ORDER — URSODIOL 300 MG PO CAPS
600.0000 mg | ORAL_CAPSULE | Freq: Two times a day (BID) | ORAL | Status: DC
  Administered 2016-08-03 – 2016-08-05 (×6): 600 mg via ORAL
  Filled 2016-08-03 (×11): qty 2

## 2016-08-03 NOTE — Care Management Obs Status (Signed)
[place] NOTIFICATION   Patient Details  Name: [name] [name] MRN: [id number] Date of Birth: 1941/07/03   Medicare Observation Status Notification Given:  Yes (verbal signature by spouse over phone, patient confused)    Hadlea Furuya, Chauncey Reading, RN 08/03/2016, 9:43 AM

## 2016-08-03 NOTE — Progress Notes (Signed)
PROGRESS NOTE    [name] [name]  [id number] DOB: 04/16/1941 DOA: 08/02/2016 PCP: Purvis Kilts, MD   Brief Narrative:  75 yof with a hx of cirrhosis, HTN, DM type 2, GERD, HLD, asthma, and chronic pancytopenia presented with complaints of AMS. While in the ED, she was noted to be hyperglycemic and leukopenic with an elevated ammonia level of 93 and WBC count of 3,700. Platelets 116. CXR showed cardiomegaly and mild vascular congestion with no acute disease. EKG showed sinus rhythm. She was admitted for further treatment of acute encephalopathy.   Assessment & Plan:   Principal Problem:   Acute encephalopathy Active Problems:   THROMBOCYTOPENIA, CHRONIC   CAD, NATIVE VESSEL   GERD   Liver cirrhosis secondary to NASH   Essential hypertension   Normocytic anemia   UTI (urinary tract infection)   Uncontrolled type 2 diabetes mellitus with complication, with long-term current use of insulin (HCC)   Leukopenia   Hepatic encephalopathy ([place])  1. Acute encephalopathy. Possibly toxic metabolic encephalopathy with elevated ammonia and UTI. There are no focal deficits. Ammonia was 93 on admission, currently 86. UA shows possible infection. Continue lactulose and xifaxan.  2. UTI. UA shows possible infection. Follow-up cultures. Her prior cultures have cultures have grown Enterococcus. Will continue empiric antibiotics with Rocephin.  3. Cirrhosis. Ammonia level was elevated at 93 on admission. Ammonia level has trended down to 86. Continue Xifaxin, Aldactone, and lasix. EGD 02/2014 with protal gastropathy, but no varices. She is due for a repeat EGD. 4. DM type 2. A1c was 6.7 earlier this month. Continue Levemir, mealtime coverage with Novolog, and SSI.  5. HTN. Stable. Continue outpatient medications.  6. GERD. Continue Protonix  7. Pancytopenia. Likely secondary to cirrhosis, all indices appear to be stable. She does have a possible infection. No signs of active blood loss.  8. HLD.  Continue statins.  9. Hx of CAD.  10. Morbid obesity. Noted.   DVT prophylaxis: Heparin  Code Status: Full  Family Communication: husband at bedside Disposition Plan: Discharge home once improved.   Consultants:   None   Procedures:   None   Antimicrobials:   Rocephin 9/22 >>    Subjective: Somnolent but answers questions appropriately. Does not have any complaints  Objective: Vitals:   08/02/16 2000 08/02/16 2111 08/03/16 0415 08/03/16 0500  BP: (!) 118/54  (!) 114/55   Pulse:  71 73   Resp:  18 (!) 21   Temp: 97.6 F (36.4 C)  98.7 F (37.1 C)   TempSrc: Oral  Oral   SpO2: 100% 96% 96%   Weight: 113.6 kg (250 lb 7.1 oz)   112.6 kg (248 lb 3.8 oz)  Height: 5\' 8"  (1.727 m)      No intake or output data in the 24 hours ending 08/03/16 0803 Filed Weights   08/02/16 1413 08/02/16 2000 08/03/16 0500  Weight: 109.8 kg (242 lb) 113.6 kg (250 lb 7.1 oz) 112.6 kg (248 lb 3.8 oz)    Examination:  General exam: Appears calm and comfortable  Respiratory system: Clear to auscultation. Respiratory effort normal. Cardiovascular system: S1 & S2 heard, RRR. No JVD, murmurs, rubs, gallops or clicks. 1+ pedal edema L>R. Gastrointestinal system: Abdomen is nondistended, soft and nontender. No organomegaly or masses felt. Normal bowel sounds heard. Central nervous system: lethargic. No focal neurological deficits. +asterixis Extremities: Symmetric 5 x 5 power. Skin: No rashes, lesions or ulcers Psychiatry: somnolent.     Data Reviewed: I have personally  reviewed following labs and imaging studies  CBC:  Recent Labs Lab 08/02/16 1453 08/03/16 0644  WBC 3.7* 2.5*  NEUTROABS 2.4 1.4*  HGB 11.7* 11.1*  HCT 36.3 34.7*  MCV 81.0 81.1  PLT 116* PENDING   Basic Metabolic Panel:  Recent Labs Lab 08/02/16 1453 08/03/16 0644  NA 134* 135  K 3.9 3.9  CL 103 106  CO2 26 24  GLUCOSE 243* 215*  BUN 16 15  CREATININE 0.65 0.58  CALCIUM 8.4* 8.5*   GFR: Estimated  Creatinine Clearance: 82.5 mL/min (by C-G formula based on SCr of 0.58 mg/dL). Liver Function Tests:  Recent Labs Lab 08/02/16 1453 08/03/16 0644  AST 18 19  ALT 13* 11*  ALKPHOS 76 80  BILITOT 1.4* 1.5*  PROT 5.5* 5.1*  ALBUMIN 3.0* 2.8*   No results for input(s): LIPASE, AMYLASE in the last 168 hours.  Recent Labs Lab 08/02/16 1453 08/03/16 0644  AMMONIA 93* 86*   Coagulation Profile:  Recent Labs Lab 08/02/16 2048  INR 1.19   Cardiac Enzymes: No results for input(s): CKTOTAL, CKMB, CKMBINDEX, TROPONINI in the last 168 hours. BNP (last 3 results) No results for input(s): PROBNP in the last 8760 hours. HbA1C: No results for input(s): HGBA1C in the last 72 hours. CBG:  Recent Labs Lab 08/02/16 1944 08/03/16 0740  GLUCAP 173* 191*   Lipid Profile: No results for input(s): CHOL, HDL, LDLCALC, TRIG, CHOLHDL, LDLDIRECT in the last 72 hours. Thyroid Function Tests:  Recent Labs  08/02/16 1453  TSH 3.264   Anemia Panel: No results for input(s): VITAMINB12, FOLATE, FERRITIN, TIBC, IRON, RETICCTPCT in the last 72 hours. Sepsis Labs: No results for input(s): PROCALCITON, LATICACIDVEN in the last 168 hours.  No results found for this or any previous visit (from the past 240 hour(s)).       Radiology Studies: Dg Chest 2 View  Result Date: 08/02/2016 CLINICAL DATA:  Altered level of consciousness for 3-4 days. History cirrhosis. EXAM: CHEST  2 VIEW COMPARISON:  PA and lateral chest 03/26/2016. Single view of the chest 03/10/2016. FINDINGS: There is cardiomegaly and mild vascular congestion without frank edema. No pneumothorax or pleural effusion. Fracture fixation hardware in the right humerus with nonunion of a fracture are noted and unchanged. IMPRESSION: Cardiomegaly and mild vascular congestion.  No acute disease. Electronically Signed   By: Inge Rise M.D.   On: 08/02/2016 15:49        Scheduled Meds: . ALPRAZolam  0.5 mg Oral BID  . aspirin  EC  81 mg Oral Daily  . cefTRIAXone (ROCEPHIN) IVPB 1 gram/50 mL D5W  1 g Intravenous Q24H  . enalapril  5 mg Oral Daily  . furosemide  20 mg Oral q morning - 10a  . heparin  5,000 Units Subcutaneous Q8H  . insulin aspart  0-15 Units Subcutaneous TID WC  . insulin aspart  0-5 Units Subcutaneous QHS  . insulin aspart  4 Units Subcutaneous TID WC  . insulin detemir  50 Units Subcutaneous Daily  . lactulose  30 g Oral TID  . mometasone-formoterol  2 puff Inhalation BID  . pantoprazole  80 mg Oral Daily  . pregabalin  100 mg Oral BID  . rifaximin  550 mg Oral BID  . sodium chloride flush  3 mL Intravenous Q12H  . spironolactone  50 mg Oral Daily  . ursodiol  600 mg Oral BID   Continuous Infusions:    LOS: 0 days    Time spent: 25 minutes  Kathie Dike, MD [facility] If 7PM-7AM, please contact night-coverage [email or web address] Password [facility] 08/03/2016, 8:03 AM

## 2016-08-03 NOTE — Care Management Note (Signed)
Case Management Note  Patient Details  Name: [name] [name] MRN: [id number] Date of Birth: [birth year]  Subjective/Objective:     Spoke with patient and husband. Patient is from home. She has an aide that comes daily. Monday through Friday, 8-4 and Saturday through Sunday 8-1. Patient has a PT consult pending. She states she feels she needs rehab. She has all the DME that she needs.        Action/Plan: Will follow for needs.   Expected Discharge Date:        08/04/2016          Expected Discharge Plan:  Skilled Nursing Facility  In-House Referral:  Clinical Social Work  Discharge planning Services  CM Consult  Post Acute Care Choice:    Choice offered to:     DME Arranged:    DME Agency:     HH Arranged:    [place] Agency:     Status of Service:  In process, will continue to follow  If discussed at Long Length of Stay Meetings, dates discussed:    Additional Comments:  Anum Palecek, Chauncey Reading, RN 08/03/2016, 2:16 PM

## 2016-08-04 ENCOUNTER — Inpatient Hospital Stay (HOSPITAL_COMMUNITY): Payer: Medicare Other

## 2016-08-04 LAB — GLUCOSE, CAPILLARY
GLUCOSE-CAPILLARY: 212 mg/dL — AB (ref 65–99)
GLUCOSE-CAPILLARY: 270 mg/dL — AB (ref 65–99)
Glucose-Capillary: 185 mg/dL — ABNORMAL HIGH (ref 65–99)
Glucose-Capillary: 235 mg/dL — ABNORMAL HIGH (ref 65–99)

## 2016-08-04 LAB — COMPREHENSIVE METABOLIC PANEL
ALK PHOS: 90 U/L (ref 38–126)
ALT: 15 U/L (ref 14–54)
AST: 20 U/L (ref 15–41)
Albumin: 3.2 g/dL — ABNORMAL LOW (ref 3.5–5.0)
Anion gap: 8 (ref 5–15)
BILIRUBIN TOTAL: 1.8 mg/dL — AB (ref 0.3–1.2)
BUN: 18 mg/dL (ref 6–20)
CO2: 25 mmol/L (ref 22–32)
CREATININE: 0.67 mg/dL (ref 0.44–1.00)
Calcium: 8.6 mg/dL — ABNORMAL LOW (ref 8.9–10.3)
Chloride: 102 mmol/L (ref 101–111)
GFR calc Af Amer: >60 mL/min (ref >60)
GLUCOSE: 193 mg/dL — AB (ref 65–99)
Potassium: 3.9 mmol/L (ref 3.5–5.1)
Sodium: 135 mmol/L (ref 135–145)
TOTAL PROTEIN: 5.9 g/dL — AB (ref 6.5–8.1)

## 2016-08-04 LAB — CBC
HEMATOCRIT: 38 % (ref 36.0–46.0)
HEMOGLOBIN: 12.3 g/dL (ref 12.0–15.0)
MCH: 26.5 pg (ref 26.0–34.0)
MCHC: 32.4 g/dL (ref 30.0–36.0)
MCV: 81.7 fL (ref 78.0–100.0)
Platelets: 127 10*3/uL — ABNORMAL LOW (ref 150–400)
RBC: 4.65 MIL/uL (ref 3.87–5.11)
RDW: 15.1 % (ref 11.5–15.5)
WBC: 3.7 10*3/uL — AB (ref 4.0–10.5)

## 2016-08-04 LAB — AMMONIA: AMMONIA: 32 umol/L (ref 9–35)

## 2016-08-04 MED ORDER — LACTULOSE 10 GM/15ML PO SOLN
30.0000 g | Freq: Three times a day (TID) | ORAL | Status: DC
Start: 2016-08-04 — End: 2016-08-05
  Administered 2016-08-04 – 2016-08-05 (×3): 30 g via ORAL
  Filled 2016-08-04 (×3): qty 60

## 2016-08-04 MED ORDER — ALPRAZOLAM 0.5 MG PO TABS: 0.5000 mg | ORAL_TABLET | Freq: Two times a day (BID) | ORAL | Status: DC | PRN

## 2016-08-04 NOTE — Progress Notes (Signed)
PROGRESS NOTE    [name] [name]  [id number] DOB: 1941/01/07 DOA: 08/02/2016 PCP: Purvis Kilts, MD   Brief Narrative:  38 yof with a hx of cirrhosis, HTN, DM type 2, GERD, HLD, asthma, and chronic pancytopenia presented with complaints of AMS. While in the ED, she was noted to be hyperglycemic and leukopenic with an elevated ammonia level of 93 and WBC count of 3,700. Platelets 116. CXR showed cardiomegaly and mild vascular congestion with no acute disease. EKG showed sinus rhythm. She was admitted for further treatment of acute encephalopathy.   Assessment & Plan:   Principal Problem:   Acute encephalopathy Active Problems:   THROMBOCYTOPENIA, CHRONIC   CAD, NATIVE VESSEL   GERD   Liver cirrhosis secondary to NASH   Essential hypertension   Normocytic anemia   UTI (urinary tract infection)   Uncontrolled type 2 diabetes mellitus with complication, with long-term current use of insulin (HCC)   Leukopenia   Hepatic encephalopathy (HCC)   Encephalopathy  1. Acute encephalopathy. Possibly toxic metabolic encephalopathy with elevated ammonia and UTI. There are no focal deficits. Ammonia was 93 on admission, currently 32. UA shows possible infection. Continue lactulose and xifaxan.  2. UTI. Urine culture positive for klebsiella. Will continue empiric antibiotics with Rocephin and follow up sensitvities.  3. Cirrhosis. Ammonia level was elevated at 93 on admission. Ammonia level has trended down into normal range of 32. Continue Xifaxin, Aldactone, and lasix. EGD 02/2014 with protal gastropathy, but no varices. She is due for a repeat EGD. 4. DM type 2. A1c was 6.7 earlier this month. Continue Levemir, mealtime coverage with Novolog, and SSI.  5. HTN. Stable. Continue outpatient medications.  6. GERD. Continue Protonix  7. Pancytopenia. Likely secondary to cirrhosis, all indices appear to be stable. She does have a possible infection. No signs of active blood loss. 8. HLD.  Continue statins.  9. Hx of CAD. Noted. 10. Morbid obesity. Noted.    DVT prophylaxis: Heparin Code Status: Full Family Communication: Husband at bedside Disposition Plan: possible SNF placement   Consultants:   None  Procedures:   None  Antimicrobials:   Rocephin 9/22 >>   Subjective: Husband feels she is still confused. She says she is feeling better.  Objective: Vitals:   08/03/16 1950 08/03/16 2019 08/04/16 0717 08/04/16 0724  BP:  133/64 132/63   Pulse:  74 75   Resp:  18 17   Temp:  98.4 F (36.9 C) 98 F (36.7 C)   TempSrc:  Oral Oral   SpO2: 95% 96% 97% 93%  Weight:   112.6 kg (248 lb 3.8 oz)   Height:        Intake/Output Summary (Last 24 hours) at 08/04/16 0930 Last data filed at 08/04/16 0915  Gross per 24 hour  Intake              530 ml  Output                0 ml  Net              530 ml   Filed Weights   08/02/16 2000 08/03/16 0500 08/04/16 0717  Weight: 113.6 kg (250 lb 7.1 oz) 112.6 kg (248 lb 3.8 oz) 112.6 kg (248 lb 3.8 oz)    Examination:  General exam: Appears calm and comfortable  Respiratory system: Clear to auscultation. Respiratory effort normal. Cardiovascular system: S1 & S2 heard, RRR. No JVD, murmurs, rubs, gallops or clicks. 1+ pedal  edema. Gastrointestinal system: Abdomen is nondistended, soft and nontender. No organomegaly or masses felt. Normal bowel sounds heard. Central nervous system: Alert and oriented. No focal neurological deficits. Extremities: Symmetric 5 x 5 power. Skin: No rashes, lesions or ulcers Psychiatry: Judgement and insight appear normal. Mood & affect appropriate.     Data Reviewed: I have personally reviewed following labs and imaging studies  CBC:  Recent Labs Lab 08/02/16 1453 08/03/16 0644 08/04/16 0651  WBC 3.7* 2.5* 3.7*  NEUTROABS 2.4 1.4*  --   HGB 11.7* 11.1* 12.3  HCT 36.3 34.7* 38.0  MCV 81.0 81.1 81.7  PLT 116* 106* [id number]*   Basic Metabolic Panel:  Recent Labs Lab  08/02/16 1453 08/03/16 0644 08/04/16 0651  NA 134* 135 135  K 3.9 3.9 3.9  CL 103 106 102  CO2 26 24 25   GLUCOSE 243* 215* 193*  BUN 16 15 18   CREATININE 0.65 0.58 0.67  CALCIUM 8.4* 8.5* 8.6*   GFR: Estimated Creatinine Clearance: 82.5 mL/min (by C-G formula based on SCr of 0.67 mg/dL). Liver Function Tests:  Recent Labs Lab 08/02/16 1453 08/03/16 0644 08/04/16 0651  AST 18 19 20   ALT 13* 11* 15  ALKPHOS 76 80 90  BILITOT 1.4* 1.5* 1.8*  PROT 5.5* 5.1* 5.9*  ALBUMIN 3.0* 2.8* 3.2*   No results for input(s): LIPASE, AMYLASE in the last 168 hours.  Recent Labs Lab 08/02/16 1453 08/03/16 0644 08/04/16 0651  AMMONIA 93* 86* 32   Coagulation Profile:  Recent Labs Lab 08/02/16 2048  INR 1.19   Cardiac Enzymes: No results for input(s): CKTOTAL, CKMB, CKMBINDEX, TROPONINI in the last 168 hours. BNP (last 3 results) No results for input(s): PROBNP in the last 8760 hours. HbA1C: No results for input(s): HGBA1C in the last 72 hours. CBG:  Recent Labs Lab 08/03/16 0740 08/03/16 1127 08/03/16 1715 08/03/16 2014 08/04/16 0731  GLUCAP 191* 195* 219* 200* 185*   Lipid Profile: No results for input(s): CHOL, HDL, LDLCALC, TRIG, CHOLHDL, LDLDIRECT in the last 72 hours. Thyroid Function Tests:  Recent Labs  08/02/16 1453  TSH 3.264   Anemia Panel: No results for input(s): VITAMINB12, FOLATE, FERRITIN, TIBC, IRON, RETICCTPCT in the last 72 hours. Sepsis Labs: No results for input(s): PROCALCITON, LATICACIDVEN in the last 168 hours.  Recent Results (from the past 240 hour(s))  MRSA PCR Screening     Status: None   Collection Time: 08/03/16  6:12 AM  Result Value Ref Range Status   MRSA by PCR NEGATIVE NEGATIVE Final    Comment:        The GeneXpert MRSA Assay (FDA approved for NASAL specimens only), is one component of a comprehensive MRSA colonization surveillance program. It is not intended to diagnose MRSA infection nor to guide or monitor  treatment for MRSA infections.          Radiology Studies: Dg Chest 2 View  Result Date: 08/02/2016 CLINICAL DATA:  Altered level of consciousness for 3-4 days. History cirrhosis. EXAM: CHEST  2 VIEW COMPARISON:  PA and lateral chest 03/26/2016. Single view of the chest 03/10/2016. FINDINGS: There is cardiomegaly and mild vascular congestion without frank edema. No pneumothorax or pleural effusion. Fracture fixation hardware in the right humerus with nonunion of a fracture are noted and unchanged. IMPRESSION: Cardiomegaly and mild vascular congestion.  No acute disease. Electronically Signed   By: Inge Rise M.D.   On: 08/02/2016 15:49        Scheduled Meds: . ALPRAZolam  0.5  mg Oral BID  . aspirin EC  81 mg Oral Daily  . cefTRIAXone (ROCEPHIN) IVPB 1 gram/50 mL D5W  1 g Intravenous Q24H  . enalapril  5 mg Oral Daily  . furosemide  20 mg Oral q morning - 10a  . heparin  5,000 Units Subcutaneous Q8H  . insulin aspart  0-15 Units Subcutaneous TID WC  . insulin aspart  0-5 Units Subcutaneous QHS  . insulin aspart  4 Units Subcutaneous TID WC  . insulin detemir  50 Units Subcutaneous Daily  . lactulose  30 g Oral QID  . mometasone-formoterol  2 puff Inhalation BID  . pantoprazole  80 mg Oral Daily  . pregabalin  100 mg Oral BID  . rifaximin  550 mg Oral BID  . sodium chloride flush  3 mL Intravenous Q12H  . spironolactone  50 mg Oral Daily  . ursodiol  600 mg Oral BID   Continuous Infusions:    LOS: 1 day    Time spent: 25 minutes    Kathie Dike, MD [facility] If 7PM-7AM, please contact night-coverage [email or web address] Password [email or web address] 08/04/2016, 9:30 AM

## 2016-08-05 LAB — GLUCOSE, CAPILLARY
GLUCOSE-CAPILLARY: 272 mg/dL — AB (ref 65–99)
Glucose-Capillary: 209 mg/dL — ABNORMAL HIGH (ref 65–99)
Glucose-Capillary: 254 mg/dL — ABNORMAL HIGH (ref 65–99)
Glucose-Capillary: 230 mg/dL — ABNORMAL HIGH (ref 65–99)

## 2016-08-05 LAB — URINE CULTURE

## 2016-08-05 MED ORDER — LACTULOSE 10 GM/15ML PO SOLN
30.0000 g | Freq: Two times a day (BID) | ORAL | Status: DC
  Administered 2016-08-06: 40 g via ORAL
  Filled 2016-08-05: qty 60

## 2016-08-05 MED ORDER — CIPROFLOXACIN HCL 250 MG PO TABS
250.0000 mg | ORAL_TABLET | Freq: Two times a day (BID) | ORAL | Status: DC
  Administered 2016-08-05 – 2016-08-06 (×2): 250 mg via ORAL
  Filled 2016-08-05 (×2): qty 1

## 2016-08-05 MED ORDER — INFLUENZA VAC SPLIT QUAD 0.5 ML IM SUSY
0.5000 mL | PREFILLED_SYRINGE | INTRAMUSCULAR | Status: AC
  Administered 2016-08-06: 0.5 mL via INTRAMUSCULAR
  Filled 2016-08-05: qty 0.5

## 2016-08-05 NOTE — Progress Notes (Signed)
PROGRESS NOTE    [name] [name]  [id number] DOB: 24-Jan-1941 DOA: 08/02/2016 PCP: Purvis Kilts, MD   Brief Narrative:  57 yof with a hx of cirrhosis, HTN, DM type 2, GERD, HLD, asthma, and chronic pancytopenia presented with complaints of AMS. While in the ED, she was noted to be hyperglycemic and leukopenic with an elevated ammonia level of 93 and WBC count of 3,700. Platelets 116. CXR showed cardiomegaly and mild vascular congestion with no acute disease. EKG showed sinus rhythm. She was admitted for further treatment of acute encephalopathy. Upon admission, ammonia levels were very elevated but has since improved to 32. She has shown much improvement and can likely discharge in AM.  Assessment & Plan:   Principal Problem:   Acute encephalopathy Active Problems:   THROMBOCYTOPENIA, CHRONIC   CAD, NATIVE VESSEL   GERD   Liver cirrhosis secondary to NASH   Essential hypertension   Normocytic anemia   UTI (urinary tract infection)   Uncontrolled type 2 diabetes mellitus with complication, with long-term current use of insulin (HCC)   Leukopenia   Hepatic encephalopathy (HCC)   Encephalopathy  1. Acute encephalopathy. Possibly toxic metabolic encephalopathy with elevated ammonia and UTI. There are no focal deficits. Ammonia was 93 on admission, currently 32. Continue lactulose and xifaxan. Improving. 2. UTI. Urine culture positive for klebsiella. Based on sensitivities, will transition Rocephin to ciprofloxacin.  3. Cirrhosis. Ammonia level was elevated at 93 on admission. Ammonia level has trended down into normal range of 32. Continue Xifaxin, Aldactone, and lasix. EGD 02/2014 with protal gastropathy, but no varices. She is due for a repeat EGD. 4. DM type 2. A1c was 6.7 earlier this month. Continue Levemir, mealtime coverage with Novolog, and SSI.  5. HTN. Stable. Continue outpatient medications.  6. GERD. Continue Protonix  7. Pancytopenia. Likely secondary to cirrhosis,  all indices appear to be stable. She does have a possible infection. No signs of active blood loss. 8. HLD. Continue statins.  9. Hx of CAD. Noted. 10. Morbid obesity. Noted.    DVT prophylaxis: Heparin Code Status: Full Family Communication: Discussed with patient Disposition Plan: possible SNF placement pending physical therapy evaluation   Consultants:   None  Procedures:   None  Antimicrobials:   Rocephin 9/22 >>   Subjective: Feeling better. Feels less confused. Complaint frequent urination.  Objective: Vitals:   08/04/16 1359 08/04/16 1932 08/04/16 2100 08/05/16 0544  BP: 136/69  120/62 (!) 124/59  Pulse: 78  77 73  Resp: 17  19 20   Temp: 97.9 F (36.6 C)  97.7 F (36.5 C) 97.9 F (36.6 C)  TempSrc: Oral  Oral Oral  SpO2: 99% 92% 97% 99%  Weight:    112.4 kg (247 lb 12.8 oz)  Height:        Intake/Output Summary (Last 24 hours) at 08/05/16 0636 Last data filed at 08/04/16 1700  Gross per 24 hour  Intake              360 ml  Output                0 ml  Net              360 ml   Filed Weights   08/03/16 0500 08/04/16 0717 08/05/16 0544  Weight: 112.6 kg (248 lb 3.8 oz) 112.6 kg (248 lb 3.8 oz) 112.4 kg (247 lb 12.8 oz)    Examination:   General exam: Appears calm and comfortable  Respiratory system:  Clear to auscultation. Respiratory effort normal. Cardiovascular system: S1 & S2 heard, RRR. No JVD, murmurs, rubs, gallops or clicks. 1+ pedal edema. Gastrointestinal system: Abdomen is nondistended, soft and nontender. No organomegaly or masses felt. Normal bowel sounds heard. Central nervous system: Alert and oriented. No focal neurological deficits. Extremities: Symmetric 5 x 5 power. Skin: No rashes, lesions or ulcers Psychiatry: Judgement and insight appear normal. Mood & affect appropriate.    Data Reviewed: I have personally reviewed following labs and imaging studies  CBC:  Recent Labs Lab 08/02/16 1453 08/03/16 0644 08/04/16 0651    WBC 3.7* 2.5* 3.7*  NEUTROABS 2.4 1.4*  --   HGB 11.7* 11.1* 12.3  HCT 36.3 34.7* 38.0  MCV 81.0 81.1 81.7  PLT 116* 106* [id number]*   Basic Metabolic Panel:  Recent Labs Lab 08/02/16 1453 08/03/16 0644 08/04/16 0651  NA 134* 135 135  K 3.9 3.9 3.9  CL 103 106 102  CO2 26 24 25   GLUCOSE 243* 215* 193*  BUN 16 15 18   CREATININE 0.65 0.58 0.67  CALCIUM 8.4* 8.5* 8.6*   GFR: Estimated Creatinine Clearance: 82.4 mL/min (by C-G formula based on SCr of 0.67 mg/dL). Liver Function Tests:  Recent Labs Lab 08/02/16 1453 08/03/16 0644 08/04/16 0651  AST 18 19 20   ALT 13* 11* 15  ALKPHOS 76 80 90  BILITOT 1.4* 1.5* 1.8*  PROT 5.5* 5.1* 5.9*  ALBUMIN 3.0* 2.8* 3.2*   No results for input(s): LIPASE, AMYLASE in the last 168 hours.  Recent Labs Lab 08/02/16 1453 08/03/16 0644 08/04/16 0651  AMMONIA 93* 86* 32   Coagulation Profile:  Recent Labs Lab 08/02/16 2048  INR 1.19   Cardiac Enzymes: No results for input(s): CKTOTAL, CKMB, CKMBINDEX, TROPONINI in the last 168 hours. BNP (last 3 results) No results for input(s): PROBNP in the last 8760 hours. HbA1C: No results for input(s): HGBA1C in the last 72 hours. CBG:  Recent Labs Lab 08/03/16 2014 08/04/16 0731 08/04/16 1131 08/04/16 1637 08/04/16 2008  GLUCAP 200* 185* 235* 212* 270*   Lipid Profile: No results for input(s): CHOL, HDL, LDLCALC, TRIG, CHOLHDL, LDLDIRECT in the last 72 hours. Thyroid Function Tests:  Recent Labs  08/02/16 1453  TSH 3.264   Anemia Panel: No results for input(s): VITAMINB12, FOLATE, FERRITIN, TIBC, IRON, RETICCTPCT in the last 72 hours. Sepsis Labs: No results for input(s): PROCALCITON, LATICACIDVEN in the last 168 hours.  Recent Results (from the past 240 hour(s))  Urine culture     Status: Abnormal (Preliminary result)   Collection Time: 08/02/16  4:51 PM  Result Value Ref Range Status   Specimen Description URINE, CLEAN CATCH  Final   Special Requests NONE  Final    Culture >=100,000 COLONIES/mL KLEBSIELLA PNEUMONIAE (A)  Final   Report Status PENDING  Incomplete  MRSA PCR Screening     Status: None   Collection Time: 08/03/16  6:12 AM  Result Value Ref Range Status   MRSA by PCR NEGATIVE NEGATIVE Final    Comment:        The GeneXpert MRSA Assay (FDA approved for NASAL specimens only), is one component of a comprehensive MRSA colonization surveillance program. It is not intended to diagnose MRSA infection nor to guide or monitor treatment for MRSA infections.      Radiology Studies: US Venous Img Lower Unilateral Left  Result Date: 08/04/2016 CLINICAL DATA:  Left lower extremity edema EXAM: LEFT LOWER EXTREMITY VENOUS DUPLEX ULTRASOUND TECHNIQUE: Doppler venous assessment of the left  lower extremity deep venous system was performed, including characterization of spectral flow, compressibility, and phasicity. COMPARISON:  None. FINDINGS: There is complete compressibility of the left common femoral, femoral, and popliteal veins. Doppler analysis demonstrates respiratory phasicity and augmentation of flow with calf compression. No obvious superficial vein or calf vein thrombosis. IMPRESSION: No evidence of left lower extremity DVT. Electronically Signed   By: Marybelle Killings M.D.   On: 08/04/2016 10:57    Scheduled Meds: . aspirin EC  81 mg Oral Daily  . cefTRIAXone (ROCEPHIN) IVPB 1 gram/50 mL D5W  1 g Intravenous Q24H  . enalapril  5 mg Oral Daily  . furosemide  20 mg Oral q morning - 10a  . heparin  5,000 Units Subcutaneous Q8H  . insulin aspart  0-15 Units Subcutaneous TID WC  . insulin aspart  0-5 Units Subcutaneous QHS  . insulin aspart  4 Units Subcutaneous TID WC  . insulin detemir  50 Units Subcutaneous Daily  . lactulose  30 g Oral TID  . mometasone-formoterol  2 puff Inhalation BID  . pantoprazole  80 mg Oral Daily  . pregabalin  100 mg Oral BID  . rifaximin  550 mg Oral BID  . sodium chloride flush  3 mL Intravenous Q12H  .  spironolactone  50 mg Oral Daily  . ursodiol  600 mg Oral BID   Continuous Infusions:    LOS: 2 days   Time spent: 25 minutes  Kathie Dike, MD [facility] If 7PM-7AM, please contact night-coverage [email or web address] Password [facility] [address] 08/05/2016, 6:36 AM

## 2016-08-06 ENCOUNTER — Encounter (HOSPITAL_COMMUNITY): Payer: Self-pay | Admitting: Student

## 2016-08-06 ENCOUNTER — Ambulatory Visit: Payer: Medicare Other | Admitting: "Endocrinology

## 2016-08-06 DIAGNOSIS — Z23 Encounter for immunization: Secondary | ICD-10-CM | POA: Diagnosis not present

## 2016-08-06 LAB — BASIC METABOLIC PANEL
ANION GAP: 7 (ref 5–15)
BUN: 17 mg/dL (ref 6–20)
CO2: 26 mmol/L (ref 22–32)
Calcium: 8.4 mg/dL — ABNORMAL LOW (ref 8.9–10.3)
Chloride: 100 mmol/L — ABNORMAL LOW (ref 101–111)
Creatinine, Ser: 0.72 mg/dL (ref 0.44–1.00)
GFR calc Af Amer: >60 mL/min (ref >60)
Glucose, Bld: 190 mg/dL — ABNORMAL HIGH (ref 65–99)
POTASSIUM: 3.9 mmol/L (ref 3.5–5.1)
SODIUM: 133 mmol/L — AB (ref 135–145)

## 2016-08-06 LAB — GLUCOSE, CAPILLARY
GLUCOSE-CAPILLARY: 189 mg/dL — AB (ref 65–99)
Glucose-Capillary: 296 mg/dL — ABNORMAL HIGH (ref 65–99)

## 2016-08-06 MED ORDER — FUROSEMIDE 20 MG PO TABS: 20.0000 mg | ORAL_TABLET | Freq: Every day | ORAL | 0 refills | Status: DC | PRN

## 2016-08-06 MED ORDER — BUDESONIDE-FORMOTEROL FUMARATE 160-4.5 MCG/ACT IN AERO: 2.0000 | INHALATION_SPRAY | Freq: Every day | RESPIRATORY_TRACT | 12 refills | Status: DC

## 2016-08-06 NOTE — Progress Notes (Addendum)
Inpatient Diabetes Program Recommendations  AACE/ADA: New Consensus Statement on Inpatient Glycemic Control (2015)  Target Ranges:  Prepandial:   less than 140 mg/dL      Peak postprandial:   less than 180 mg/dL (1-2 hours)      Critically ill patients:  140 - 180 mg/dL   Results for [name] [name], [name] [name] (MRN ID:[id number]) as of 08/06/2016 11:26  Ref. Range 08/05/2016 07:38 08/05/2016 11:54 08/05/2016 16:09 08/05/2016 19:58  Glucose-Capillary Latest Ref Range: 65 - 99 mg/dL 209 (H) 272 (H) 254 (H) 230 (H)   Results for [name] [name], [name] [name] (MRN ID:[id number]) as of 08/06/2016 11:26  Ref. Range 08/06/2016 07:32 08/06/2016 11:19  Glucose-Capillary Latest Ref Range: 65 - 99 mg/dL 189 (H) 296 (H)    Home DM Meds: Levemir 50 units QHS       Humalog 10-16 units tidwc  Current Orders: Levemir 50 units daily       Novolog Moderate Correction Scale/ SSI (0-15 units) TID AC + HS      Novolog 4 units tidwc     MD- Please consider the following in-hospital insulin adjustments:  1. Increase Levemir to 55 units daily  2. Increase Novolog Meal Coverage to Novolog 10 units tid with meals (hold if pt eats <50% of meal)      --Will follow patient during hospitalization--  Wyn Quaker RN, MSN, CDE Diabetes Coordinator Inpatient Glycemic Control Team Team Pager: [phone] (8a-5p)

## 2016-08-06 NOTE — Evaluation (Signed)
Physical Therapy Evaluation Patient Details Name: [name] [name] MRN: [id number] DOB: 09/12/1941 Today's Date: 08/06/2016   History of Present Illness  75 yo F admitted after patient had reportedly been in her usual state of health until she was noted to be increasingly lethargic and confused by family approximately 3 days ago. Patient is not had a fever, apparent respiratory distress, or voiced any specific complaints.  The patient has had similar symptoms multiple times in the past, typically when her ammonia level is elevated or she has a UTI.  Ammonia level is elevated to 93.  Urinalysis features many bacteria, small leukocytes, positive nitrite, and 0-5 WBC.  Dx:  acute encephalopathy, suspect to be multifactorial with contributions from hepatic encephalopathy and UTI.  PMH: L anterior fasicular block, B LE weakness, CAD, chronic shoulder pain, cirrhosis of liver NASH, colon adenomas, diverticula of colon, obesity, pancreatitis, pancytopenia, neurosis, thrombocytopenia, DM2, HTN, fall, idiopathic peripheral neuropathy, vertigo, breast surgery, L foot surgery, ORIF R humerus fx, tumor excision R arm and L foot.     Clinical Impression  Pt received in bed, and was agreeable to PT evaluation.  Pt expressed that she requires assistance for transfers, from her bed<>w/c, as well as assist for dressing, and bathing.  She has an aid that is with her for 8 hours per day, and then her children rotate coming to assist in the evenings after thee aid has left.  Pt states she was receiving HHPT, however that stopped ~5-6 weeks ago.  With HHPT she was using the RW to transfer bed<>w/c, however since that stopped, she has mostly been using the hoyer lift for transfers.    Today during PT, she required Max A for supine<>sit, and Max A for sit<>stand and SPT from the bed<>chair.  She was very unsteady on her feet and demonstrated uncontrolled descent into the chair.  Hoyer lift sling was positioned in the chair for  RN's to use to assist pt BTB.  At this point she would benefit from SNF to work on transfers and increasing strength and mobility and she was agreeable to that if she can go to the [facility].  If she is not able to go to the [facility] she would benefit from continued 24/7 superversion/assistance, and resume HHPT.    Follow Up Recommendations SNF    Equipment Recommendations  None recommended by PT    Recommendations for Other Services       Precautions / Restrictions Precautions Precautions: Fall Precaution Comments: "Not to my knowledge" - re: falls in the last 6 months.  However, per my PT evaluation back in April, the family had reported that she had been falling more frequently.   Restrictions Weight Bearing Restrictions: No      Mobility  Bed Mobility Overal bed mobility: Needs Assistance Bed Mobility: Supine to Sit     Supine to sit: Max assist;HOB elevated     General bed mobility comments: HOB completely elevated.  Pt states that her husband assists her with bed mobility.    Transfers Overall transfer level: Needs assistance Equipment used: Rolling walker (2 wheeled) Transfers: Stand Pivot Transfers;Sit to/from Stand Sit to Stand: Max assist;From elevated surface (Bed height raised, and pt with hands placed upon the RW.  She utilized momentum and was able to stand on a 3 count with Max A. ) Stand pivot transfers: Max assist (Pt demonstrates poor ability to advance L LE, as well as notable trembling in B LE's.  Pt did not  get herself completely lined up with the chair before performing uncontrolled descent into the chair.  )          Ambulation/Gait Ambulation/Gait assistance:  (NA - pt is not ambulatory at baseline. )              Stairs            Wheelchair Mobility    Modified Rankin (Stroke Patients Only)       Balance Overall balance assessment: Needs assistance Sitting-balance support: Single extremity supported (Pt holds R UE in  sling like position in towards her body and avoids using it for balance/support if possible. )   Sitting balance - Comments: Pt demonstrates some trunkal ataxia/weakness and requires CGA/Min A for dynamic sitting balance when scooting towards the EOB     Standing balance support: Bilateral upper extremity supported Standing balance-Leahy Scale: Poor                               Pertinent Vitals/Pain Pain Assessment: 0-10 Pain Score: 4  Pain Location: R shoulder - where it was broken  Pain Descriptors / Indicators: Aching Pain Intervention(s): Limited activity within patient's tolerance;Premedicated before session;Repositioned    Home Living   Living Arrangements: Spouse/significant other (husband) Available Help at Discharge: Personal care attendant (aid that comes from 8-4, then her children come after that to assist with meals) Type of Home: House Home Access: Ramped entrance     Home Layout: One level Home Equipment: Walker - 2 wheels;Cane - single point;Bedside commode;Wheelchair - manual;Electric scooter;Hospital bed;Grab bars - toilet;Grab bars - tub/shower;Hand held shower head (hoyer lift, lift chair, )      Prior Function Level of Independence: Needs assistance   Gait / Transfers Assistance Needed:  Pt states she mostly uses her w/c for mobility, and is able to propel it herself with her UE's.  Pt states that he would transfer with a RW when the HHPT would come out.  However, pt reports that it has been 5-6 weeks since she has had HHPT, and she has been using the hoyer lift for transfers OOB with her family.    ADL's / Homemaking Assistance Needed: Pt states that her husband and her aid help her with bed mobility, transfers, dressing, bathing.  Children assist with meal prep.         Hand Dominance   Dominant Hand: Right    Extremity/Trunk Assessment   Upper Extremity Assessment: RUE deficits/detail;LUE deficits/detail RUE Deficits / Details: Pt  unable to peform shoulder elevation tasks due to prior fx'd humerus that is non-operable.  Grossly 3/5 with elbow flexion, and moderate grip strength.  Pt states that she still feeds herself, and pt seen using R UE to drink from a cup.  Pt noted to have ataxia.       LUE Deficits / Details: Shoulder flexion limited AROM to ~100*, Elbow flexion and grip strength and WFL, also with ataxia on the L side.     Lower Extremity Assessment: Generalized weakness;LLE deficits/detail   LLE Deficits / Details: Noted ataxia with resting positioning in plantar flexed and supinated position.  She is able to achieve Neutral with PROM, but not with AROM.  Noted mass on anterior aspect of ankle.    Cervical / Trunk Assessment: Kyphotic  Communication   Communication: No difficulties  Cognition Arousal/Alertness: Awake/alert Behavior During Therapy: WFL for tasks assessed/performed Overall Cognitive Status: History of cognitive  impairments - at baseline (not oriented to the year. Pt also seems to be easily distracted. )                      General Comments      Exercises     Assessment/Plan    PT Assessment Patient needs continued PT services  PT Problem List Decreased strength;Decreased activity tolerance;Decreased balance;Decreased mobility;Decreased coordination;Decreased cognition;Decreased knowledge of use of DME;Decreased safety awareness;Decreased knowledge of precautions;Pain;Obesity          PT Treatment Interventions      PT Goals (Current goals can be found in the Care Plan section)  Acute Rehab PT Goals Patient Stated Goal: Pt wants to be able to walk again.  Educated pt that may be an unrealistic goal due to her being w/c bound for so long, however encouraged that we would work on trasfers.  PT Goal Formulation: With patient Time For Goal Achievement: 08/13/16 Potential to Achieve Goals: Fair    Frequency     Barriers to discharge        Co-evaluation                End of Session Equipment Utilized During Treatment: Gait belt Activity Tolerance: Patient tolerated treatment well Patient left: in chair;with call bell/phone within reach;with family/visitor present;Other (comment) (Hoyer lift positioned underneath the pt in the chair. ) Nurse Communication: Need for lift equipment;Mobility status Magda Paganini, RN notified of pt's mobility status, and position, and to use the Maxi move to transfer pt BTB for safety)    Functional Assessment Tool Used: [place] "6-clicks"  Functional Limitation: Mobility: Walking and moving around Mobility: Walking and Moving Around Current Status [phone]): At least 60 percent but less than 80 percent impaired, limited or restricted Mobility: Walking and Moving Around Goal Status [phone]): At least 40 percent but less than 60 percent impaired, limited or restricted    Time: 0933-1011 PT Time Calculation (min) (ACUTE ONLY): 38 min   Charges:   PT Evaluation $PT Eval Moderate Complexity: 1 Procedure PT Treatments $Therapeutic Activity: 23-37 mins   PT G Codes:   PT G-Codes **NOT FOR INPATIENT CLASS** Functional Assessment Tool Used: [employer] "6-clicks"  Functional Limitation: Mobility: Walking and moving around Mobility: Walking and Moving Around Current Status [phone]): At least 60 percent but less than 80 percent impaired, limited or restricted Mobility: Walking and Moving Around Goal Status [phone]): At least 40 percent but less than 60 percent impaired, limited or restricted   Beth Jaxzen Vanhorn, PT, DPT X: [phone]

## 2016-08-06 NOTE — Clinical Social Work Note (Addendum)
Clinical Social Work Assessment  Patient Details  Name: [name] [name] MRN: [id number] Date of Birth: 02-27-1941  Date of referral:  08/06/16               Reason for consult:  Discharge Planning                Permission sought to share information with:  Family Supports Permission granted to share information::  Yes, Verbal Permission Granted  Name::     Psychologist, educational::     Relationship::  husband  Contact Information:     Housing/Transportation Living arrangements for the past 2 months:  Single Family Home Source of Information:  Patient Patient Interpreter Needed:  None Criminal Activity/Legal Involvement Pertinent to Current Situation/Hospitalization:  No - Comment as needed Significant Relationships:  Spouse Lives with:  Spouse Do you feel safe going back to the place where you live?  Yes Need for family participation in patient care:  Yes (Comment)  Care giving concerns:  Pt requires extensive assist with ADLs.    Social Worker assessment / plan:  CSW met with pt at bedside. Pt well known to CSW from previous admissions. Her husband called Friday stating that they were hoping to get pt to [facility] for rehab. PT evaluated pt this morning and recommend SNF. CSW discussed placement process with pt. Her husband called [facility] this morning and was told there were no beds. CSW shared this with pt and she plans to return home. Pt has around the clock care between her CAP aid and husband. She has a CAP aid from 8-4 Monday through Friday and 8-1 on weekends. Pt has a lift. She is agreeable to home health services. CSW attempted to call pt's husband in room and left voicemail. CSW signing off.   Employment status:  Retired Nurse, adult PT Recommendations:  [place] / Referral to community resources:  [place]  Patient/Family's Response to care:  Pt refuses SNF as [facility] has told them they have no beds. CSW confirmed with  facility that they cannot accept pt.   Patient/Family's Understanding of and Emotional Response to Diagnosis, Current Treatment, and Prognosis:  Pt aware she is approaching d/c and is ready to return home. She states she will not consider any other SNF since [facility] is unable to take her.   Emotional Assessment Appearance:  Appears stated age Attitude/Demeanor/Rapport:  Other (Cooperative) Affect (typically observed):  Appropriate Orientation:  Oriented to Self, Oriented to Place, Oriented to  Time, Oriented to Situation Alcohol / Substance use:  Not Applicable Psych involvement (Current and /or in the community):  No (Comment)  Discharge Needs  Concerns to be addressed:  Discharge Planning Concerns Readmission within the last 30 days:  No Current discharge risk:  Physical Impairment Barriers to Discharge:  No Barriers Identified   Salome Arnt, [place] 08/06/2016, 10:32 AM [phone]

## 2016-08-06 NOTE — Consult Note (Signed)
   [facility] Inpatient Consult   08/06/2016  [name] [name] 1940/11/27 ID:[id number]   Spoke with patient and spouse at bedside regarding [facility] services. Patient reports she has goos support at home from spouse, family and [facility] aide. Patient will get home care services upon discharge. Patient does not wish to participate with [facility] at this time.  Patient given [facility] brochure and contact information for future reference, voices appreciation of information.    Inpatient case manager aware that patient offered [facility] case management services but declined.   Of note, [facility] Care Management services would not replace or interfere with any services that are arranged by inpatient case management or social work.  For additional questions or referrals please contact:   Royetta Crochet. Laymond Purser, RN, BSN, [facility] Liaison [phone]

## 2016-08-06 NOTE — Care Management (Addendum)
Patient recommended for SNF, does not want to go anywhere but to [facility], which is not an option. She has had [facility] In the past and would like to use them again. [place] [facility] notified and will obtain orders from chart. Patient and husband are aware that [facility] has 48 hours to make first visit.

## 2016-08-06 NOTE — Discharge Summary (Addendum)
Physician Discharge Summary  [name] [name] [id number] DOB: 02-Nov-1941 DOA: 08/02/2016  PCP: Purvis Kilts, MD  Admit date: 08/02/2016 Discharge date: 08/06/2016  Admitted From: Home  Disposition: home  Recommendations for Outpatient Follow-up:  1. Follow up with PCP in 1-2 weeks 2. Please obtain BMP/CBC in one week  Home Health: No  Equipment/Devices: None   Discharge Condition: Stable  CODE STATUS: Full  Diet recommendation: Heart healthy   Brief/Interim Summary: 35 yof with a hx of cirrhosis, HTN, DM type 2, GERD, HLD, asthma, and chronic pancytopenia presented with complaints of AMS. While in the ED, she was noted to be hyperglycemic and leukopenic with an elevated ammonia level of 93 and WBC count of 3,700. Platelets 116. CXR showed cardiomegaly and mild vascular congestion with no acute disease. EKG showed sinus rhythm. She was admitted for further treatment of acute encephalopathy.    Discharge Diagnoses:  Principal Problem:   Acute encephalopathy Active Problems:   THROMBOCYTOPENIA, CHRONIC   CAD, NATIVE VESSEL   GERD   Liver cirrhosis secondary to NASH   Essential hypertension   Normocytic anemia   UTI (urinary tract infection)   Uncontrolled type 2 diabetes mellitus with complication, with long-term current use of insulin (HCC)   Leukopenia   Hepatic encephalopathy (HCC)   Encephalopathy    1. Hepatic encephalopathy. There are no focal deficits. Ammonia was 93 on admission, but with lactulose treatment, it trended down to 32. Mental status is now back to baseline. Continue lactulose and xifaxan as an outpatient. 2. UTI. Urine culture positive for klebsiella. She Was initially treated with Rocephin and transitioned to ciprofloxacin on discharge based on culture sensitivities  3. Cirrhosis. Ammonia level was elevated at 93 on admission. Ammonia level has trended down into normal range of 32. Continue Xifaxin, Aldactone, and lasix. EGD 02/2014 with protal  gastropathy, but no varices. She is due for a repeat EGD as an outpatient. 4. DM type 2. A1c was 6.7 earlier this month. Blood sugars remained stable during hospital stay 5. HTN. Stable. Continue outpatient medications.  6. GERD. Continue Protonix  7. Pancytopenia. Likely secondary to cirrhosis, all indices appear to be stable.  8. HLD. Continue statins..  9. Hx of CAD. Noted. 10. Morbid obesity. Noted. Discharge Instructions    Diet - low sodium heart healthy    Complete by:  As directed    Increase activity slowly    Complete by:  As directed        Medication List    STOP taking these medications   cephALEXin 500 MG capsule Commonly known as:  Senath these medications   ALPRAZolam 0.5 MG tablet Commonly known as:  XANAX Take 1 tablet (0.5 mg total) by mouth 3 (three) times daily as needed for anxiety. What changed:  when to take this   aspirin EC 81 MG tablet Take 81 mg by mouth daily.   budesonide-formoterol 160-4.5 MCG/ACT inhaler Commonly known as:  SYMBICORT Inhale 2 puffs into the lungs at bedtime.   CENTRUM SILVER PO Take 1 tablet by mouth daily.   diphenhydrAMINE 25 MG tablet Commonly known as:  BENADRYL Take 25 mg by mouth every 6 (six) hours as needed.   enalapril 5 MG tablet Commonly known as:  VASOTEC Take 1 tablet by mouth daily.   ESTRACE VAGINAL 0.1 MG/GM vaginal cream Generic drug:  estradiol Place 1 Applicatorful vaginally every other day. At  bedtime   furosemide 20 MG tablet Commonly known  as:  LASIX Take 1 tablet (20 mg total) by mouth daily as needed for fluid.   HUMALOG KWIKPEN 100 UNIT/ML KiwkPen Generic drug:  insulin lispro INJECT 10-16 UNITS INTO THE SKIN THREE TIMES DAILY   lactulose 10 GM/15ML solution Commonly known as:  CHRONULAC Take 20 g by mouth 3 (three) times daily.   LEVEMIR FLEXTOUCH 100 UNIT/ML Pen Generic drug:  Insulin Detemir INJECT 50 UNITS INTO THE SKIN DAILY AT 10PM   NEXIUM 40 MG  capsule Generic drug:  esomeprazole Take 40 mg by mouth every morning.   ONETOUCH VERIO test strip Generic drug:  glucose blood USE UP TO FOUR TIMES DAILY AS DIRECTED   oxyCODONE 5 MG immediate release tablet Commonly known as:  Oxy IR/ROXICODONE Take 5 mg by mouth 2 (two) times daily as needed for moderate pain or severe pain.   pregabalin 100 MG capsule Commonly known as:  LYRICA Take 100 mg by mouth 2 (two) times daily.   rifaximin 550 MG Tabs tablet Commonly known as:  XIFAXAN Take 1 tablet (550 mg total) by mouth 2 (two) times daily.   spironolactone 50 MG tablet Commonly known as:  ALDACTONE TAKE ONE TABLET BY MOUTH ONCE DAILY IN THE MORNING   triamcinolone cream 0.1 % Commonly known as:  KENALOG Apply 1 application topically daily as needed (for irritation).   URSO FORTE 500 MG tablet Generic drug:  ursodiol Take 500 mg by mouth 2 (two) times daily.      Follow-up Information    Purvis Kilts, MD. Schedule an appointment as soon as possible for a visit in 2 week(s).   Specialty:  Family Medicine Contact information: [address] [place] [id number] [phone]          Allergies  Allergen Reactions  . Morphine And Related Itching  . Compazine [Prochlorperazine Edisylate] Anxiety    Causes anxiety & nervousness    Consultations:  None    Procedures/Studies: Dg Chest 2 View  Result Date: 08/02/2016 CLINICAL DATA:  Altered level of consciousness for 3-4 days. History cirrhosis. EXAM: CHEST  2 VIEW COMPARISON:  PA and lateral chest 03/26/2016. Single view of the chest 03/10/2016. FINDINGS: There is cardiomegaly and mild vascular congestion without frank edema. No pneumothorax or pleural effusion. Fracture fixation hardware in the right humerus with nonunion of a fracture are noted and unchanged. IMPRESSION: Cardiomegaly and mild vascular congestion.  No acute disease. Electronically Signed   By: Inge Rise M.D.   On: 08/02/2016  15:49   US Venous Img Lower Unilateral Left  Result Date: 08/04/2016 CLINICAL DATA:  Left lower extremity edema EXAM: LEFT LOWER EXTREMITY VENOUS DUPLEX ULTRASOUND TECHNIQUE: Doppler venous assessment of the left lower extremity deep venous system was performed, including characterization of spectral flow, compressibility, and phasicity. COMPARISON:  None. FINDINGS: There is complete compressibility of the left common femoral, femoral, and popliteal veins. Doppler analysis demonstrates respiratory phasicity and augmentation of flow with calf compression. No obvious superficial vein or calf vein thrombosis. IMPRESSION: No evidence of left lower extremity DVT. Electronically Signed   By: Marybelle Killings M.D.   On: 08/04/2016 10:57     Subjective: Feeling better today. Confusion has resolved. Husband also agrees that she is back to baseline.  Discharge Exam: Vitals:   08/06/16 0613 08/06/16 0900  BP: (!) 122/57 (!) 142/88  Pulse: 78 90  Resp: 20 18  Temp: 97.9 F (36.6 C)    Vitals:   08/05/16 2009 08/06/16 [id number] 08/06/16 0719 08/06/16  0900  BP: (!) 115/52 (!) 122/57  (!) 142/88  Pulse: 88 78  90  Resp: 20 20  18   Temp: 97.9 F (36.6 C) 97.9 F (36.6 C)    TempSrc: Oral Oral    SpO2: 93% 94% 94% 96%  Weight:  112.3 kg (247 lb 9.2 oz)    Height:        General: Pt is alert, awake, not in acute distress Cardiovascular: RRR, S1/S2 +, no rubs, no gallops Respiratory: CTA bilaterally, no wheezing, no rhonchi Abdominal: Soft, NT, ND, bowel sounds + Extremities: Trace edema, no cyanosis    The results of significant diagnostics from this hospitalization (including imaging, microbiology, ancillary and laboratory) are listed below for reference.     Microbiology: Recent Results (from the past 240 hour(s))  Urine culture     Status: Abnormal   Collection Time: 08/02/16  4:51 PM  Result Value Ref Range Status   Specimen Description URINE, CLEAN CATCH  Final   Special Requests NONE   Final   Culture >=100,000 COLONIES/mL KLEBSIELLA PNEUMONIAE (A)  Final   Report Status 08/05/2016 FINAL  Final   Organism ID, Bacteria KLEBSIELLA PNEUMONIAE (A)  Final      Susceptibility   Klebsiella pneumoniae - MIC*    AMPICILLIN >=32 RESISTANT Resistant     CEFAZOLIN <=4 SENSITIVE Sensitive     CEFTRIAXONE <=1 SENSITIVE Sensitive     CIPROFLOXACIN <=0.25 SENSITIVE Sensitive     GENTAMICIN <=1 SENSITIVE Sensitive     IMIPENEM <=0.25 SENSITIVE Sensitive     NITROFURANTOIN 64 INTERMEDIATE Intermediate     TRIMETH/SULFA <=20 SENSITIVE Sensitive     AMPICILLIN/SULBACTAM 4 SENSITIVE Sensitive     PIP/TAZO <=4 SENSITIVE Sensitive     Extended ESBL NEGATIVE Sensitive     * >=100,000 COLONIES/mL KLEBSIELLA PNEUMONIAE  MRSA PCR Screening     Status: None   Collection Time: 08/03/16  6:12 AM  Result Value Ref Range Status   MRSA by PCR NEGATIVE NEGATIVE Final    Comment:        The GeneXpert MRSA Assay (FDA approved for NASAL specimens only), is one component of a comprehensive MRSA colonization surveillance program. It is not intended to diagnose MRSA infection nor to guide or monitor treatment for MRSA infections.      Labs: BNP (last 3 results)  Recent Labs  03/26/16 1456  BNP [id number]   Basic Metabolic Panel:  Recent Labs Lab 08/02/16 1453 08/03/16 0644 08/04/16 0651 08/06/16 0626  NA 134* 135 135 133*  K 3.9 3.9 3.9 3.9  CL 103 106 102 100*  CO2 26 24 25 26   GLUCOSE 243* 215* 193* 190*  BUN 16 15 18 17   CREATININE 0.65 0.58 0.67 0.72  CALCIUM 8.4* 8.5* 8.6* 8.4*   Liver Function Tests:  Recent Labs Lab 08/02/16 1453 08/03/16 0644 08/04/16 0651  AST 18 19 20   ALT 13* 11* 15  ALKPHOS 76 80 90  BILITOT 1.4* 1.5* 1.8*  PROT 5.5* 5.1* 5.9*  ALBUMIN 3.0* 2.8* 3.2*   No results for input(s): LIPASE, AMYLASE in the last 168 hours.  Recent Labs Lab 08/02/16 1453 08/03/16 0644 08/04/16 0651  AMMONIA 93* 86* 32   CBC:  Recent Labs Lab  08/02/16 1453 08/03/16 0644 08/04/16 0651  WBC 3.7* 2.5* 3.7*  NEUTROABS 2.4 1.4*  --   HGB 11.7* 11.1* 12.3  HCT 36.3 34.7* 38.0  MCV 81.0 81.1 81.7  PLT 116* 106* 127*   Cardiac Enzymes: No  results for input(s): CKTOTAL, CKMB, CKMBINDEX, TROPONINI in the last 168 hours. BNP: Invalid input(s): POCBNP CBG:  Recent Labs Lab 08/05/16 1154 08/05/16 1609 08/05/16 1958 08/06/16 0732 08/06/16 1119  GLUCAP 272* 254* 230* 189* 296*   D-Dimer No results for input(s): DDIMER in the last 72 hours. Hgb A1c No results for input(s): HGBA1C in the last 72 hours. Lipid Profile No results for input(s): CHOL, HDL, LDLCALC, TRIG, CHOLHDL, LDLDIRECT in the last 72 hours. Thyroid function studies No results for input(s): TSH, T4TOTAL, T3FREE, THYROIDAB in the last 72 hours.  Invalid input(s): FREET3 Anemia work up No results for input(s): VITAMINB12, FOLATE, FERRITIN, TIBC, IRON, RETICCTPCT in the last 72 hours. Urinalysis    Component Value Date/Time   COLORURINE YELLOW 08/02/2016 1429   APPEARANCEUR CLEAR 08/02/2016 1429   LABSPEC 1.020 08/02/2016 1429   PHURINE 5.5 08/02/2016 1429   GLUCOSEU 250 (A) 08/02/2016 1429   HGBUR NEGATIVE 08/02/2016 1429   BILIRUBINUR NEGATIVE 08/02/2016 1429   KETONESUR NEGATIVE 08/02/2016 1429   PROTEINUR NEGATIVE 08/02/2016 1429   UROBILINOGEN 1.0 01/18/2015 0139   NITRITE POSITIVE (A) 08/02/2016 1429   LEUKOCYTESUR SMALL (A) 08/02/2016 1429   Sepsis Labs Invalid input(s): PROCALCITONIN,  WBC,  LACTICIDVEN Microbiology Recent Results (from the past 240 hour(s))  Urine culture     Status: Abnormal   Collection Time: 08/02/16  4:51 PM  Result Value Ref Range Status   Specimen Description URINE, CLEAN CATCH  Final   Special Requests NONE  Final   Culture >=100,000 COLONIES/mL KLEBSIELLA PNEUMONIAE (A)  Final   Report Status 08/05/2016 FINAL  Final   Organism ID, Bacteria KLEBSIELLA PNEUMONIAE (A)  Final      Susceptibility   Klebsiella  pneumoniae - MIC*    AMPICILLIN >=32 RESISTANT Resistant     CEFAZOLIN <=4 SENSITIVE Sensitive     CEFTRIAXONE <=1 SENSITIVE Sensitive     CIPROFLOXACIN <=0.25 SENSITIVE Sensitive     GENTAMICIN <=1 SENSITIVE Sensitive     IMIPENEM <=0.25 SENSITIVE Sensitive     NITROFURANTOIN 64 INTERMEDIATE Intermediate     TRIMETH/SULFA <=20 SENSITIVE Sensitive     AMPICILLIN/SULBACTAM 4 SENSITIVE Sensitive     PIP/TAZO <=4 SENSITIVE Sensitive     Extended ESBL NEGATIVE Sensitive     * >=100,000 COLONIES/mL KLEBSIELLA PNEUMONIAE  MRSA PCR Screening     Status: None   Collection Time: 08/03/16  6:12 AM  Result Value Ref Range Status   MRSA by PCR NEGATIVE NEGATIVE Final    Comment:        The GeneXpert MRSA Assay (FDA approved for NASAL specimens only), is one component of a comprehensive MRSA colonization surveillance program. It is not intended to diagnose MRSA infection nor to guide or monitor treatment for MRSA infections.      Time coordinating discharge: Over 30 minutes  SIGNED:   Kathie Dike, MD [facility] 08/06/2016, 7:09 PM If 7PM-7AM, please contact night-coverage [email or web address] Password [email or web address]

## 2016-08-06 NOTE — Care Management Important Message (Signed)
Important Message  Patient Details  Name: [name] [name] MRN: [id number] Date of Birth: 19-Oct-1941   Medicare Important Message Given:  Yes    Chelan Heringer, Chauncey Reading, RN 08/06/2016, 9:41 AM

## 2016-08-08 DIAGNOSIS — K7460 Unspecified cirrhosis of liver: Secondary | ICD-10-CM | POA: Diagnosis not present

## 2016-08-08 DIAGNOSIS — Z8719 Personal history of other diseases of the digestive system: Secondary | ICD-10-CM | POA: Diagnosis not present

## 2016-08-08 DIAGNOSIS — I2510 Atherosclerotic heart disease of native coronary artery without angina pectoris: Secondary | ICD-10-CM | POA: Diagnosis not present

## 2016-08-08 DIAGNOSIS — J45909 Unspecified asthma, uncomplicated: Secondary | ICD-10-CM | POA: Diagnosis not present

## 2016-08-08 DIAGNOSIS — Z794 Long term (current) use of insulin: Secondary | ICD-10-CM | POA: Diagnosis not present

## 2016-08-08 DIAGNOSIS — Z792 Long term (current) use of antibiotics: Secondary | ICD-10-CM | POA: Diagnosis not present

## 2016-08-08 DIAGNOSIS — I10 Essential (primary) hypertension: Secondary | ICD-10-CM | POA: Diagnosis not present

## 2016-08-08 DIAGNOSIS — E1142 Type 2 diabetes mellitus with diabetic polyneuropathy: Secondary | ICD-10-CM | POA: Diagnosis not present

## 2016-08-08 DIAGNOSIS — Z79891 Long term (current) use of opiate analgesic: Secondary | ICD-10-CM | POA: Diagnosis not present

## 2016-08-09 DIAGNOSIS — Z8719 Personal history of other diseases of the digestive system: Secondary | ICD-10-CM | POA: Diagnosis not present

## 2016-08-09 DIAGNOSIS — I10 Essential (primary) hypertension: Secondary | ICD-10-CM | POA: Diagnosis not present

## 2016-08-09 DIAGNOSIS — E1142 Type 2 diabetes mellitus with diabetic polyneuropathy: Secondary | ICD-10-CM | POA: Diagnosis not present

## 2016-08-09 DIAGNOSIS — Z794 Long term (current) use of insulin: Secondary | ICD-10-CM | POA: Diagnosis not present

## 2016-08-09 DIAGNOSIS — J45909 Unspecified asthma, uncomplicated: Secondary | ICD-10-CM | POA: Diagnosis not present

## 2016-08-09 DIAGNOSIS — Z792 Long term (current) use of antibiotics: Secondary | ICD-10-CM | POA: Diagnosis not present

## 2016-08-09 DIAGNOSIS — I2510 Atherosclerotic heart disease of native coronary artery without angina pectoris: Secondary | ICD-10-CM | POA: Diagnosis not present

## 2016-08-09 DIAGNOSIS — K7460 Unspecified cirrhosis of liver: Secondary | ICD-10-CM | POA: Diagnosis not present

## 2016-08-09 DIAGNOSIS — Z79891 Long term (current) use of opiate analgesic: Secondary | ICD-10-CM | POA: Diagnosis not present

## 2016-08-13 DIAGNOSIS — K7460 Unspecified cirrhosis of liver: Secondary | ICD-10-CM | POA: Diagnosis not present

## 2016-08-13 DIAGNOSIS — Z7689 Persons encountering health services in other specified circumstances: Secondary | ICD-10-CM | POA: Diagnosis not present

## 2016-08-13 DIAGNOSIS — K7290 Hepatic failure, unspecified without coma: Secondary | ICD-10-CM | POA: Diagnosis not present

## 2016-08-13 DIAGNOSIS — Z1389 Encounter for screening for other disorder: Secondary | ICD-10-CM | POA: Diagnosis not present

## 2016-08-13 DIAGNOSIS — E1142 Type 2 diabetes mellitus with diabetic polyneuropathy: Secondary | ICD-10-CM | POA: Diagnosis not present

## 2016-08-13 DIAGNOSIS — Z8719 Personal history of other diseases of the digestive system: Secondary | ICD-10-CM | POA: Diagnosis not present

## 2016-08-13 DIAGNOSIS — Z79891 Long term (current) use of opiate analgesic: Secondary | ICD-10-CM | POA: Diagnosis not present

## 2016-08-13 DIAGNOSIS — Z794 Long term (current) use of insulin: Secondary | ICD-10-CM | POA: Diagnosis not present

## 2016-08-13 DIAGNOSIS — D696 Thrombocytopenia, unspecified: Secondary | ICD-10-CM | POA: Diagnosis not present

## 2016-08-13 DIAGNOSIS — Z792 Long term (current) use of antibiotics: Secondary | ICD-10-CM | POA: Diagnosis not present

## 2016-08-13 DIAGNOSIS — E7220 Disorder of urea cycle metabolism, unspecified: Secondary | ICD-10-CM | POA: Diagnosis not present

## 2016-08-13 DIAGNOSIS — I2510 Atherosclerotic heart disease of native coronary artery without angina pectoris: Secondary | ICD-10-CM | POA: Diagnosis not present

## 2016-08-13 DIAGNOSIS — I10 Essential (primary) hypertension: Secondary | ICD-10-CM | POA: Diagnosis not present

## 2016-08-13 DIAGNOSIS — J45909 Unspecified asthma, uncomplicated: Secondary | ICD-10-CM | POA: Diagnosis not present

## 2016-08-16 DIAGNOSIS — K7460 Unspecified cirrhosis of liver: Secondary | ICD-10-CM | POA: Diagnosis not present

## 2016-08-16 DIAGNOSIS — I2510 Atherosclerotic heart disease of native coronary artery without angina pectoris: Secondary | ICD-10-CM | POA: Diagnosis not present

## 2016-08-16 DIAGNOSIS — E1142 Type 2 diabetes mellitus with diabetic polyneuropathy: Secondary | ICD-10-CM | POA: Diagnosis not present

## 2016-08-16 DIAGNOSIS — J45909 Unspecified asthma, uncomplicated: Secondary | ICD-10-CM | POA: Diagnosis not present

## 2016-08-16 DIAGNOSIS — Z79891 Long term (current) use of opiate analgesic: Secondary | ICD-10-CM | POA: Diagnosis not present

## 2016-08-16 DIAGNOSIS — Z792 Long term (current) use of antibiotics: Secondary | ICD-10-CM | POA: Diagnosis not present

## 2016-08-16 DIAGNOSIS — I10 Essential (primary) hypertension: Secondary | ICD-10-CM | POA: Diagnosis not present

## 2016-08-16 DIAGNOSIS — Z794 Long term (current) use of insulin: Secondary | ICD-10-CM | POA: Diagnosis not present

## 2016-08-16 DIAGNOSIS — Z8719 Personal history of other diseases of the digestive system: Secondary | ICD-10-CM | POA: Diagnosis not present

## 2016-08-19 DIAGNOSIS — M169 Osteoarthritis of hip, unspecified: Secondary | ICD-10-CM | POA: Diagnosis not present

## 2016-08-19 DIAGNOSIS — M150 Primary generalized (osteo)arthritis: Secondary | ICD-10-CM | POA: Diagnosis not present

## 2016-08-23 DIAGNOSIS — Z79891 Long term (current) use of opiate analgesic: Secondary | ICD-10-CM | POA: Diagnosis not present

## 2016-08-23 DIAGNOSIS — K7460 Unspecified cirrhosis of liver: Secondary | ICD-10-CM | POA: Diagnosis not present

## 2016-08-23 DIAGNOSIS — J45909 Unspecified asthma, uncomplicated: Secondary | ICD-10-CM | POA: Diagnosis not present

## 2016-08-23 DIAGNOSIS — Z8719 Personal history of other diseases of the digestive system: Secondary | ICD-10-CM | POA: Diagnosis not present

## 2016-08-23 DIAGNOSIS — Z792 Long term (current) use of antibiotics: Secondary | ICD-10-CM | POA: Diagnosis not present

## 2016-08-23 DIAGNOSIS — I2510 Atherosclerotic heart disease of native coronary artery without angina pectoris: Secondary | ICD-10-CM | POA: Diagnosis not present

## 2016-08-23 DIAGNOSIS — I10 Essential (primary) hypertension: Secondary | ICD-10-CM | POA: Diagnosis not present

## 2016-08-23 DIAGNOSIS — E1142 Type 2 diabetes mellitus with diabetic polyneuropathy: Secondary | ICD-10-CM | POA: Diagnosis not present

## 2016-08-23 DIAGNOSIS — Z794 Long term (current) use of insulin: Secondary | ICD-10-CM | POA: Diagnosis not present

## 2016-08-27 DIAGNOSIS — E1151 Type 2 diabetes mellitus with diabetic peripheral angiopathy without gangrene: Secondary | ICD-10-CM | POA: Diagnosis not present

## 2016-08-27 DIAGNOSIS — E1140 Type 2 diabetes mellitus with diabetic neuropathy, unspecified: Secondary | ICD-10-CM | POA: Diagnosis not present

## 2016-09-13 DIAGNOSIS — E7220 Disorder of urea cycle metabolism, unspecified: Secondary | ICD-10-CM | POA: Diagnosis not present

## 2016-09-13 DIAGNOSIS — E1142 Type 2 diabetes mellitus with diabetic polyneuropathy: Secondary | ICD-10-CM | POA: Diagnosis not present

## 2016-09-19 DIAGNOSIS — M150 Primary generalized (osteo)arthritis: Secondary | ICD-10-CM | POA: Diagnosis not present

## 2016-09-19 DIAGNOSIS — M169 Osteoarthritis of hip, unspecified: Secondary | ICD-10-CM | POA: Diagnosis not present

## 2016-09-28 ENCOUNTER — Encounter (HOSPITAL_COMMUNITY): Payer: Self-pay | Admitting: Emergency Medicine

## 2016-09-28 ENCOUNTER — Inpatient Hospital Stay (HOSPITAL_COMMUNITY)
Admission: EM | Admit: 2016-09-28 | Discharge: 2016-09-30 | DRG: 690 | Disposition: A | Discharge status: Home / Self Care | Payer: Medicare Other | Attending: Internal Medicine | Admitting: Internal Medicine

## 2016-09-28 ENCOUNTER — Emergency Department (HOSPITAL_COMMUNITY): Payer: Medicare Other

## 2016-09-28 DIAGNOSIS — I2510 Atherosclerotic heart disease of native coronary artery without angina pectoris: Secondary | ICD-10-CM | POA: Diagnosis not present

## 2016-09-28 DIAGNOSIS — Z833 Family history of diabetes mellitus: Secondary | ICD-10-CM

## 2016-09-28 DIAGNOSIS — Z87440 Personal history of urinary (tract) infections: Secondary | ICD-10-CM

## 2016-09-28 DIAGNOSIS — Z6837 Body mass index (BMI) 37.0-37.9, adult: Secondary | ICD-10-CM

## 2016-09-28 DIAGNOSIS — D649 Anemia, unspecified: Secondary | ICD-10-CM | POA: Diagnosis present

## 2016-09-28 DIAGNOSIS — D72819 Decreased white blood cell count, unspecified: Secondary | ICD-10-CM | POA: Diagnosis present

## 2016-09-28 DIAGNOSIS — K7581 Nonalcoholic steatohepatitis (NASH): Secondary | ICD-10-CM | POA: Diagnosis not present

## 2016-09-28 DIAGNOSIS — R600 Localized edema: Secondary | ICD-10-CM | POA: Diagnosis not present

## 2016-09-28 DIAGNOSIS — Z7401 Bed confinement status: Secondary | ICD-10-CM | POA: Diagnosis not present

## 2016-09-28 DIAGNOSIS — W19XXXA Unspecified fall, initial encounter: Secondary | ICD-10-CM | POA: Diagnosis present

## 2016-09-28 DIAGNOSIS — N390 Urinary tract infection, site not specified: Principal | ICD-10-CM | POA: Diagnosis present

## 2016-09-28 DIAGNOSIS — E0840 Diabetes mellitus due to underlying condition with diabetic neuropathy, unspecified: Secondary | ICD-10-CM | POA: Diagnosis not present

## 2016-09-28 DIAGNOSIS — E1140 Type 2 diabetes mellitus with diabetic neuropathy, unspecified: Secondary | ICD-10-CM | POA: Diagnosis present

## 2016-09-28 DIAGNOSIS — I10 Essential (primary) hypertension: Secondary | ICD-10-CM | POA: Diagnosis not present

## 2016-09-28 DIAGNOSIS — R262 Difficulty in walking, not elsewhere classified: Secondary | ICD-10-CM | POA: Diagnosis not present

## 2016-09-28 DIAGNOSIS — R531 Weakness: Secondary | ICD-10-CM

## 2016-09-28 DIAGNOSIS — K219 Gastro-esophageal reflux disease without esophagitis: Secondary | ICD-10-CM | POA: Diagnosis not present

## 2016-09-28 DIAGNOSIS — Z79899 Other long term (current) drug therapy: Secondary | ICD-10-CM

## 2016-09-28 DIAGNOSIS — G609 Hereditary and idiopathic neuropathy, unspecified: Secondary | ICD-10-CM | POA: Diagnosis present

## 2016-09-28 DIAGNOSIS — Z888 Allergy status to other drugs, medicaments and biological substances status: Secondary | ICD-10-CM

## 2016-09-28 DIAGNOSIS — Y92009 Unspecified place in unspecified non-institutional (private) residence as the place of occurrence of the external cause: Secondary | ICD-10-CM

## 2016-09-28 DIAGNOSIS — Z7951 Long term (current) use of inhaled steroids: Secondary | ICD-10-CM

## 2016-09-28 DIAGNOSIS — M6281 Muscle weakness (generalized): Secondary | ICD-10-CM | POA: Diagnosis not present

## 2016-09-28 DIAGNOSIS — D696 Thrombocytopenia, unspecified: Secondary | ICD-10-CM | POA: Diagnosis not present

## 2016-09-28 DIAGNOSIS — R05 Cough: Secondary | ICD-10-CM | POA: Diagnosis not present

## 2016-09-28 DIAGNOSIS — Z794 Long term (current) use of insulin: Secondary | ICD-10-CM

## 2016-09-28 DIAGNOSIS — K7460 Unspecified cirrhosis of liver: Secondary | ICD-10-CM | POA: Diagnosis present

## 2016-09-28 DIAGNOSIS — R279 Unspecified lack of coordination: Secondary | ICD-10-CM | POA: Diagnosis not present

## 2016-09-28 DIAGNOSIS — Z7982 Long term (current) use of aspirin: Secondary | ICD-10-CM | POA: Diagnosis not present

## 2016-09-28 DIAGNOSIS — Z885 Allergy status to narcotic agent status: Secondary | ICD-10-CM

## 2016-09-28 DIAGNOSIS — R404 Transient alteration of awareness: Secondary | ICD-10-CM | POA: Diagnosis not present

## 2016-09-28 DIAGNOSIS — Z8249 Family history of ischemic heart disease and other diseases of the circulatory system: Secondary | ICD-10-CM

## 2016-09-28 LAB — COMPREHENSIVE METABOLIC PANEL
ALK PHOS: 79 U/L (ref 38–126)
ALT: 14 U/L (ref 14–54)
ANION GAP: 6 (ref 5–15)
AST: 17 U/L (ref 15–41)
Albumin: 3.2 g/dL — ABNORMAL LOW (ref 3.5–5.0)
BILIRUBIN TOTAL: 1.7 mg/dL — AB (ref 0.3–1.2)
BUN: 15 mg/dL (ref 6–20)
CALCIUM: 8.9 mg/dL (ref 8.9–10.3)
CO2: 28 mmol/L (ref 22–32)
Chloride: 102 mmol/L (ref 101–111)
Creatinine, Ser: 0.73 mg/dL (ref 0.44–1.00)
GFR calc non Af Amer: >60 mL/min (ref >60)
Glucose, Bld: 222 mg/dL — ABNORMAL HIGH (ref 65–99)
POTASSIUM: 3.9 mmol/L (ref 3.5–5.1)
SODIUM: 136 mmol/L (ref 135–145)
TOTAL PROTEIN: 5.9 g/dL — AB (ref 6.5–8.1)

## 2016-09-28 LAB — CBC WITH DIFFERENTIAL/PLATELET
BASOS ABS: 0 10*3/uL (ref 0.0–0.1)
BASOS PCT: 1 %
Eosinophils Absolute: 0.1 10*3/uL (ref 0.0–0.7)
Eosinophils Relative: 3 %
HEMATOCRIT: 36.6 % (ref 36.0–46.0)
HEMOGLOBIN: 11.9 g/dL — AB (ref 12.0–15.0)
Lymphocytes Relative: 27 %
Lymphs Abs: 1 10*3/uL (ref 0.7–4.0)
MCH: 26.6 pg (ref 26.0–34.0)
MCHC: 32.5 g/dL (ref 30.0–36.0)
MCV: 81.7 fL (ref 78.0–100.0)
Monocytes Absolute: 0.3 10*3/uL (ref 0.1–1.0)
Monocytes Relative: 8 %
NEUTROS ABS: 2.3 10*3/uL (ref 1.7–7.7)
NEUTROS PCT: 62 %
Platelets: 114 10*3/uL — ABNORMAL LOW (ref 150–400)
RBC: 4.48 MIL/uL (ref 3.87–5.11)
RDW: 14.9 % (ref 11.5–15.5)
WBC: 3.8 10*3/uL — ABNORMAL LOW (ref 4.0–10.5)

## 2016-09-28 LAB — URINE MICROSCOPIC-ADD ON

## 2016-09-28 LAB — URINALYSIS, ROUTINE W REFLEX MICROSCOPIC
BILIRUBIN URINE: NEGATIVE
Glucose, UA: NEGATIVE mg/dL
Hgb urine dipstick: NEGATIVE
Ketones, ur: NEGATIVE mg/dL
NITRITE: POSITIVE — AB
Protein, ur: NEGATIVE mg/dL
SPECIFIC GRAVITY, URINE: 1.02 (ref 1.005–1.030)
pH: 6 (ref 5.0–8.0)

## 2016-09-28 LAB — GLUCOSE, CAPILLARY
GLUCOSE-CAPILLARY: 232 mg/dL — AB (ref 65–99)
Glucose-Capillary: 223 mg/dL — ABNORMAL HIGH (ref 65–99)

## 2016-09-28 LAB — AMMONIA: Ammonia: 72 umol/L — ABNORMAL HIGH (ref 9–35)

## 2016-09-28 MED ORDER — RIFAXIMIN 550 MG PO TABS
550.0000 mg | ORAL_TABLET | Freq: Two times a day (BID) | ORAL | Status: DC
  Administered 2016-09-28 – 2016-09-30 (×5): 550 mg via ORAL
  Filled 2016-09-28 (×6): qty 1

## 2016-09-28 MED ORDER — POTASSIUM CHLORIDE IN NACL 20-0.9 MEQ/L-% IV SOLN
INTRAVENOUS | Status: DC
  Administered 2016-09-28: 15:00:00 via INTRAVENOUS
  Administered 2016-09-29 – 2016-09-30 (×2): 50 mL/h via INTRAVENOUS

## 2016-09-28 MED ORDER — MOMETASONE FURO-FORMOTEROL FUM 200-5 MCG/ACT IN AERO
2.0000 | INHALATION_SPRAY | Freq: Two times a day (BID) | RESPIRATORY_TRACT | Status: DC
  Administered 2016-09-28 – 2016-09-30 (×4): 2 via RESPIRATORY_TRACT
  Filled 2016-09-28: qty 8.8

## 2016-09-28 MED ORDER — PANTOPRAZOLE SODIUM 40 MG PO TBEC
80.0000 mg | DELAYED_RELEASE_TABLET | Freq: Every day | ORAL | Status: DC
  Administered 2016-09-28 – 2016-09-30 (×3): 80 mg via ORAL
  Filled 2016-09-28 (×4): qty 2

## 2016-09-28 MED ORDER — DEXTROSE 5 % IV SOLN
1.0000 g | INTRAVENOUS | Status: DC
  Administered 2016-09-29 – 2016-09-30 (×2): 1 g via INTRAVENOUS
  Filled 2016-09-28 (×3): qty 10

## 2016-09-28 MED ORDER — ONDANSETRON HCL 4 MG/2ML IJ SOLN: 4.0000 mg | Freq: Four times a day (QID) | INTRAMUSCULAR | Status: DC | PRN

## 2016-09-28 MED ORDER — ONDANSETRON HCL 4 MG PO TABS: 4.0000 mg | ORAL_TABLET | Freq: Four times a day (QID) | ORAL | Status: DC | PRN

## 2016-09-28 MED ORDER — ACETAMINOPHEN 650 MG RE SUPP: 650.0000 mg | Freq: Four times a day (QID) | RECTAL | Status: DC | PRN

## 2016-09-28 MED ORDER — URSODIOL 300 MG PO CAPS
600.0000 mg | ORAL_CAPSULE | Freq: Two times a day (BID) | ORAL | Status: DC
  Administered 2016-09-28 – 2016-09-30 (×4): 600 mg via ORAL
  Filled 2016-09-28 (×6): qty 2

## 2016-09-28 MED ORDER — SPIRONOLACTONE 25 MG PO TABS
50.0000 mg | ORAL_TABLET | Freq: Every day | ORAL | Status: DC
  Administered 2016-09-29 – 2016-09-30 (×2): 50 mg via ORAL
  Filled 2016-09-28 (×2): qty 2

## 2016-09-28 MED ORDER — OXYCODONE HCL 5 MG PO TABS: 5.0000 mg | ORAL_TABLET | Freq: Two times a day (BID) | ORAL | Status: DC | PRN

## 2016-09-28 MED ORDER — DIPHENHYDRAMINE HCL 25 MG PO CAPS: 25.0000 mg | ORAL_CAPSULE | Freq: Three times a day (TID) | ORAL | Status: DC | PRN

## 2016-09-28 MED ORDER — PREGABALIN 50 MG PO CAPS
100.0000 mg | ORAL_CAPSULE | Freq: Two times a day (BID) | ORAL | Status: DC
  Administered 2016-09-28 – 2016-09-30 (×5): 100 mg via ORAL
  Filled 2016-09-28 (×6): qty 2

## 2016-09-28 MED ORDER — ENOXAPARIN SODIUM 40 MG/0.4ML ~~LOC~~ SOLN
40.0000 mg | SUBCUTANEOUS | Status: DC
  Administered 2016-09-28 – 2016-09-29 (×2): 40 mg via SUBCUTANEOUS
  Filled 2016-09-28 (×3): qty 0.4

## 2016-09-28 MED ORDER — DEXTROSE 5 % IV SOLN
1.0000 g | Freq: Once | INTRAVENOUS | Status: AC
  Administered 2016-09-28: 1 g via INTRAVENOUS
  Filled 2016-09-28: qty 10

## 2016-09-28 MED ORDER — FUROSEMIDE 20 MG PO TABS: 20.0000 mg | ORAL_TABLET | Freq: Every day | ORAL | Status: DC | PRN

## 2016-09-28 MED ORDER — ENALAPRIL MALEATE 5 MG PO TABS
5.0000 mg | ORAL_TABLET | Freq: Every day | ORAL | Status: DC
  Administered 2016-09-28 – 2016-09-30 (×3): 5 mg via ORAL
  Filled 2016-09-28 (×4): qty 1

## 2016-09-28 MED ORDER — INSULIN DETEMIR 100 UNIT/ML ~~LOC~~ SOLN
50.0000 [IU] | Freq: Every day | SUBCUTANEOUS | Status: DC
  Administered 2016-09-28 – 2016-09-29 (×2): 50 [IU] via SUBCUTANEOUS
  Filled 2016-09-28 (×3): qty 0.5

## 2016-09-28 MED ORDER — INSULIN ASPART 100 UNIT/ML ~~LOC~~ SOLN
0.0000 [IU] | Freq: Three times a day (TID) | SUBCUTANEOUS | Status: DC
  Administered 2016-09-28: 5 [IU] via SUBCUTANEOUS
  Administered 2016-09-29: 2 [IU] via SUBCUTANEOUS
  Administered 2016-09-29 (×2): 8 [IU] via SUBCUTANEOUS
  Administered 2016-09-30: 11 [IU] via SUBCUTANEOUS
  Administered 2016-09-30: 3 [IU] via SUBCUTANEOUS

## 2016-09-28 MED ORDER — ALPRAZOLAM 0.5 MG PO TABS
0.5000 mg | ORAL_TABLET | Freq: Two times a day (BID) | ORAL | Status: DC
  Administered 2016-09-28 – 2016-09-30 (×5): 0.5 mg via ORAL
  Filled 2016-09-28 (×6): qty 1

## 2016-09-28 MED ORDER — LACTULOSE 10 GM/15ML PO SOLN
20.0000 g | Freq: Three times a day (TID) | ORAL | Status: DC
  Administered 2016-09-28 – 2016-09-29 (×3): 20 g via ORAL
  Filled 2016-09-28 (×4): qty 30

## 2016-09-28 MED ORDER — INSULIN ASPART 100 UNIT/ML ~~LOC~~ SOLN
0.0000 [IU] | Freq: Every day | SUBCUTANEOUS | Status: DC
  Administered 2016-09-29: 2 [IU] via SUBCUTANEOUS

## 2016-09-28 MED ORDER — ACETAMINOPHEN 325 MG PO TABS: 325.0000 mg | ORAL_TABLET | Freq: Four times a day (QID) | ORAL | Status: DC | PRN

## 2016-09-28 MED ORDER — ASPIRIN EC 81 MG PO TBEC
81.0000 mg | DELAYED_RELEASE_TABLET | Freq: Every day | ORAL | Status: DC
  Administered 2016-09-28 – 2016-09-30 (×3): 81 mg via ORAL
  Filled 2016-09-28 (×4): qty 1

## 2016-09-28 NOTE — Progress Notes (Signed)
Received report from Wells Guiles in ED regarding patient coming to rm [id number]. Oswald Hillock, RN

## 2016-09-28 NOTE — H&P (Signed)
History and Physical    [name] [name] [id number] DOB: Dec 25, 1940 DOA: 09/28/2016  PCP: Purvis Kilts, MD  Gastroenterology Dr. Gala Romney  Patient coming from: Home  Chief Complaint: Generalized weakness.  HPI: [name] [name] is a 75 y.o. 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 Patient has had a several day history of worsening generalized weakness. She acknowledges a malodorous urine and some discomfort with urination. She had been occasionally confused, but mostly weak generally with no focal weakness. The home health nurse evaluated the patient this morning and tried to ambulate with the patient, but the patient was unable to continue and was able to slide on the floor braced by the nurse. Patient denies subjective fever or chills, nausea, vomiting, chest congestion, abdominal pain, or diarrhea. She generally has 1-2 loose bowel movements daily on lactulose.  ED Course: In the ED, she was afebrile and hemodynamically stable. Her lab data were significant for a glucose of 222, ammonia of 72, total bilirubin of 1.7, normal LFTs otherwise, WBC of 3.8, and platelet count of 114. Her urinalysis was grossly infected appearing. She was admitted for further evaluation and management.  Review of Systems: As per HPI otherwise 10 point review of systems negative.    Past Medical History:  Diagnosis Date  . Abnormal EKG    hx of left anterior fasicular block on 04-09-13 ekg epic  . Allergic rhinitis, cause unspecified   . Bilateral leg weakness   . CAD in native artery   . Chronic gastritis 03/09/11   egd by Dr. Gala Romney  . Chronic shoulder pain   . Cirrhosis ([place])   . Cirrhosis of liver (HCC)    NASH, afp on 06/17/12 =3.7,  per pt she had hep B vaccines in 1996, hep A in process.   . Colon adenomas 03/08/10   tcs by Dr. Gala Romney  . Diverticula of colon 03/08/10   L side  . Esophagitis, erosive 03/08/10  . GERD (gastroesophageal reflux disease)   . History of hemorrhoids 03/08/10   tcs- internal and external  . Hyperplastic polyps of stomach 03/08/10   tcs by Dr. Dudley Major  . Intrinsic asthma, unspecified   . Obesity, unspecified   . Other and unspecified hyperlipidemia   . Pancreatitis   . Pancytopenia, acquired ([place]) 04/12/2014  . Personal history of neurosis   . Thrombocytopenia ([place]) 04/12/2014  . Thrombocytopenia, unspecified   . Type II or unspecified type diabetes mellitus without mention of complication, not stated as uncontrolled   . Unspecified essential hypertension   . Unspecified fall   . Unspecified hereditary and idiopathic peripheral neuropathy   . Vertigo   . Wears glasses     Past Surgical History:  Procedure Laterality Date  . ABDOMINAL HYSTERECTOMY    . BREAST SURGERY Right few yrs ago   benign area removed  . CARDIAC CATHETERIZATION  02/25/2007   Dr. [name]:  normal LV systolic function, mild irregularities of LAD  . COLONOSCOPY  03/08/10   Dr. [name]-->ext/int hemorrhoids, anal paipilla, rectal polyps, desc polyps, cecal polyp, left-sided diverticula. suboptiomal prep. next TCS 02/2015. multiple adenomas  . ESOPHAGOGASTRODUODENOSCOPY  03/08/10   Dr. [name] erosive RE, small hh, antral erosions, small 1cm are of mucosal indentation along gastric body of doubtful significance, cystic nodularity of hypophyarynx,  base of tongue, base of epiglottis. benign gastric biopsies  . ESOPHAGOGASTRODUODENOSCOPY  10/08/2011   Dr. [name] esophageal varices, antral erosion. next egd 09/2013  . ESOPHAGOGASTRODUODENOSCOPY N/A 02/17/2014   Dr. Gala Romney: portal gastropathy, no varices. Due for surveillance 2017  . EYE SURGERY     cataracts bilateral  . FOOT SURGERY  2003   lt foot  . hysterectomy and btl      s/p  . ORIF HUMERUS FRACTURE Right 02/17/2013   Procedure: OPEN REDUCTION INTERNAL FIXATION (ORIF) RIGHT PROXIMAL HUMERUS FRACTURE;  Surgeon: [name], MD;  Location: [facility];  Service: Orthopedics;  Laterality: Right;  . PARTIAL GASTRECTOMY     family denies  . TUBAL LIGATION    . TUMOR EXCISION  2003   rt arm and left foot    Social history: She is married and lives with her husband. She reports that she has never smoked. She has never used smokeless tobacco. She reports that she does not drink alcohol or use drugs.She is mostly bedridden, but can transfer with assistance.  Allergies  Allergen Reactions  . Morphine And Related Itching  . Compazine [Prochlorperazine Edisylate] Anxiety    Causes anxiety & nervousness    Family History  Problem Relation Age of Onset  . Coronary artery disease      FH  . Diabetes      FH  . Heart disease Mother   . Colon cancer Neg Hx      Prior to Admission medications   Medication Sig Start Date End Date Taking? Authorizing Provider  ALPRAZolam Duanne Moron) 0.5 MG tablet Take 1 tablet (0.5 mg total) by mouth 3 (three) times daily as needed for anxiety. Patient taking differently: Take 0.5 mg by mouth 2 (two) times daily.  12/24/14  Yes [name], MD  aspirin EC 81 MG tablet Take 81 mg by mouth daily.   Yes Historical Provider, MD  budesonide-formoterol (SYMBICORT) 160-4.5 MCG/ACT inhaler Inhale 2 puffs into the lungs at bedtime. 08/06/16  Yes Kathie Dike, MD  diphenhydrAMINE (BENADRYL) 25 MG tablet Take 25 mg by mouth every 6 (six) hours as needed.   Yes Historical Provider, MD  enalapril (VASOTEC) 5 MG tablet Take 1 tablet by mouth daily. 08/25/14  Yes Historical Provider, MD  furosemide (LASIX) 20 MG tablet Take 1 tablet (20 mg total) by mouth daily as needed for fluid. 08/06/16  Yes Kathie Dike, MD  HUMALOG KWIKPEN 100 UNIT/ML KiwkPen INJECT 10-16 UNITS INTO THE SKIN THREE TIMES DAILY 07/12/16  Yes Cassandria Anger, MD    lactulose (CHRONULAC) 10 GM/15ML solution Take 20 g by mouth 3 (three) times daily.  10/20/14  Yes Historical Provider, MD  LEVEMIR FLEXTOUCH 100 UNIT/ML Pen INJECT 50 UNITS INTO THE SKIN DAILY AT 10PM 04/11/16  Yes Cassandria Anger, MD  NEXIUM 40 MG capsule Take 40 mg by mouth every morning.  06/17/14  Yes Historical Provider, MD  ONETOUCH VERIO test strip USE UP TO FOUR TIMES DAILY AS DIRECTED 07/02/16  Yes Cassandria Anger, MD  oxyCODONE (OXY IR/ROXICODONE) 5 MG immediate release tablet Take 5 mg by mouth 2 (two) times daily as needed for moderate pain or severe pain.  03/16/16  Yes Historical Provider, MD  pregabalin (LYRICA) 100 MG capsule Take 100 mg by mouth 2 (two) times daily.   Yes Historical Provider, MD  rifaximin (XIFAXAN) 550 MG TABS tablet Take 1 tablet (550 mg total) by mouth 2 (two) times daily. 06/28/14  Yes Kathie Dike,  MD  spironolactone (ALDACTONE) 50 MG tablet TAKE ONE TABLET BY MOUTH ONCE DAILY IN THE MORNING 03/19/16  Yes Mahala Menghini, PA-C  triamcinolone cream (KENALOG) 0.1 % Apply 1 application topically daily as needed (for irritation).  06/26/13  Yes Historical Provider, MD  ursodiol (URSO FORTE) 500 MG tablet Take 500 mg by mouth 2 (two) times daily.   Yes Historical Provider, MD    Physical Exam: Vitals:   09/28/16 1100 09/28/16 1130 09/28/16 1200 09/28/16 1427  BP: 120/60 109/59 110/62 123/77  Pulse: 65 65  65  Resp: 18 14 14 16   Temp:    98.4 F (36.9 C)  TempSrc:    Oral  SpO2: 97% 96%  98%  Weight:      Height:          Constitutional: NAD, calm, comfortable Vitals:   09/28/16 1100 09/28/16 1130 09/28/16 1200 09/28/16 1427  BP: 120/60 109/59 110/62 123/77  Pulse: 65 65  65  Resp: 18 14 14 16   Temp:    98.4 F (36.9 C)  TempSrc:    Oral  SpO2: 97% 96%  98%  Weight:      Height:       Eyes: PERRL, lids and conjunctivae normal ENMT: Mucous membranes are mildly dry. Posterior pharynx clear of any exudate or lesions.Multiple broken off teeth.   Neck: normal, supple, no masses, no thyromegaly Respiratory: clear to auscultation bilaterally, no wheezing, no crackles. Normal respiratory effort. No accessory muscle use.  Cardiovascular: Regular rate and rhythm, no murmurs / rubs / gallops. Trace right lower extremity edema and trace to 1+ left lower extremity edema. 2+ pedal pulses. No carotid bruits.  Abdomen: Obese, no tenderness, no masses palpated. No hepatosplenomegaly. Bowel sounds positive.  Musculoskeletal: no clubbing / cyanosis. No joint deformity upper and lower extremities. Good ROM, no contractures. Normal muscle tone.  Skin: no rashes, lesions, ulcers. No induration Neurologic: CN 2-12 grossly intact. Sensation intact, DTR normal. Strength globally 5 minus to 4+ over 5 of lower extremities bilaterally-chronic.  Psychiatric: Normal judgment and insight. Alert and oriented x 3. Normal mood.     Labs on Admission: I have personally reviewed following labs and imaging studies  CBC:  Recent Labs Lab 09/28/16 1026  WBC 3.8*  NEUTROABS 2.3  HGB 11.9*  HCT 36.6  MCV 81.7  PLT 99991111*   Basic Metabolic Panel:  Recent Labs Lab 09/28/16 1026  NA 136  K 3.9  CL 102  CO2 28  GLUCOSE 222*  BUN 15  CREATININE 0.73  CALCIUM 8.9   GFR: Estimated Creatinine Clearance: 82.8 mL/min (by C-G formula based on SCr of 0.73 mg/dL). Liver Function Tests:  Recent Labs Lab 09/28/16 1026  AST 17  ALT 14  ALKPHOS 79  BILITOT 1.7*  PROT 5.9*  ALBUMIN 3.2*   No results for input(s): LIPASE, AMYLASE in the last 168 hours.  Recent Labs Lab 09/28/16 1026  AMMONIA 72*   Coagulation Profile: No results for input(s): INR, PROTIME in the last 168 hours. Cardiac Enzymes: No results for input(s): CKTOTAL, CKMB, CKMBINDEX, TROPONINI in the last 168 hours. BNP (last 3 results) No results for input(s): PROBNP in the last 8760 hours. HbA1C: No results for input(s): HGBA1C in the last 72 hours. CBG:  Recent Labs Lab  09/28/16 1625  GLUCAP 232*   Lipid Profile: No results for input(s): CHOL, HDL, LDLCALC, TRIG, CHOLHDL, LDLDIRECT in the last 72 hours. Thyroid Function Tests: No results for input(s): TSH, T4TOTAL, FREET4,  T3FREE, THYROIDAB in the last 72 hours. Anemia Panel: No results for input(s): VITAMINB12, FOLATE, FERRITIN, TIBC, IRON, RETICCTPCT in the last 72 hours. Urine analysis:    Component Value Date/Time   COLORURINE YELLOW 09/28/2016 1054   APPEARANCEUR CLOUDY (A) 09/28/2016 1054   LABSPEC 1.020 09/28/2016 1054   PHURINE 6.0 09/28/2016 1054   GLUCOSEU NEGATIVE 09/28/2016 1054   HGBUR NEGATIVE 09/28/2016 1054   [place] 09/28/2016 1054   [place] 09/28/2016 1054   PROTEINUR NEGATIVE 09/28/2016 1054   UROBILINOGEN 1.0 01/18/2015 0139   NITRITE POSITIVE (A) 09/28/2016 1054   LEUKOCYTESUR MODERATE (A) 09/28/2016 1054   Sepsis Labs: !!!!!!!!!!!!!!!!!!!!!!!!!!!!!!!!!!!!!!!!!!!! @LABRCNTIP (procalcitonin:4,lacticidven:4) )No results found for this or any previous visit (from the past 240 hour(s)).   Radiological Exams on Admission: Dg Chest Portable 1 View  Result Date: 09/28/2016 CLINICAL DATA:  Cough, wheezing and weakness for 1 week. EXAM: PORTABLE CHEST 1 VIEW COMPARISON:  08/02/2016 FINDINGS: Enlargement of the cardiac silhouette is grossly stable. Few densities in the right lower lung may be related to atelectasis. Upper lungs are clear. Again noted are postsurgical changes in the right humerus with a fractured surgical plate. Patient is rotated towards the left. IMPRESSION: Stable cardiomegaly without acute findings. Electronically Signed   By: Markus Daft M.D.   On: 09/28/2016 10:53    EKG: Independently reviewed. Normal sinus rhythm, left vesicular block, heart rate 67 bpm.  Assessment/Plan Principal Problem:   UTI (urinary tract infection) Active Problems:   Liver cirrhosis secondary to NASH (HCC)   Diabetes mellitus with neuropathy (HCC)   Leg  edema, left   Thrombocytopenia (HCC)   Hereditary and idiopathic peripheral neuropathy   Essential hypertension   Normocytic anemia   Difficulty walking   Generalized weakness   Morbid obesity ([place])    1. Urinary tract infection causing generalized weakness. Urine culture was ordered and she has been started on Rocephin. We'll continue Rocephin. We'll add gentle IV fluids for hydration. -We'll order a TSH.  Type 2 diabetes mellitus with neuropathy. Patient is treated chronically with Humalog sliding scale 3 times a day and 50 units of Levemir daily at bedtime. She is also treated with Lyrica. -She will be restarted on Levemir and a sliding scale NovoLog regimen will be ordered. We'll continue Lyrica. -We'll order hemoglobin A1c.  Left lower extremity edema. -We'll order venous ultrasound imaging to rule out DVT.  [place] cirrhosis. -Currently stable. Her ammonia level is 72, but she does not appear to be encephalopathic. We'll continue lactulose, ursodiol, and rifaximin. We'll hold spironolactone until 09/29/16.  Hypertension. She is treated chronically with enalapril. It will be continued. Her blood pressure stable.  Chronic thrombocytopenia and leukopenia. -Secondary to cirrhosis. Currently at baseline.  Difficulty with ambulating/mostly chronically bedridden secondary to morbid obesity and peripheral neuropathy.      DVT prophylaxis:Lovenox Code Status:Full code Family Communication:Discussed with husband Disposition Plan:Discharge to home with clinically appropriate Consults called: None Admission status: Inpatient Phebe Colla MD [facility] Pager [phone]  If 7PM-7AM, please contact night-coverage [email or web address] Password [email or web address]  09/28/2016, 4:53 PM

## 2016-09-28 NOTE — ED Triage Notes (Signed)
EMS bring for home, generalized weakness, pt has right shoulder fx can not operated due to severe cirrhosis, treated for UTI last month, pt still has dark urine. Home health care nurse at the home this morning. Pt was weak ambulating to bathroom, and nurse assisted pt to floor and called EMS. No injuries.   BS 250 has not taken insulin this Am

## 2016-09-28 NOTE — ED Notes (Signed)
Lab and xray at the bedside 

## 2016-09-28 NOTE — ED Provider Notes (Signed)
[facility] DEPT Provider Note   CSN: [id number] Arrival date & time: 09/28/16  [id number]     History   Chief Complaint Chief Complaint  Patient presents with  . Fall    HPI [name] [name] is a 75 y.o. female.  HPI   Pt with hx cirrhosis (NASH), HTN, DM, GERD, HLD, asthma, chronic pancytopenia 2/2 cirrhosis, encephalopathy p/w generalized weakness that began today with gradual worsening of chronic symptoms.  Per husband, patient's health has waxed and waned, currently in a worse state similar to last admission of encephalopathy (07/2016).  Pt has had malodorous urine x 2-3 weeks, has had increased coughing and wheezing, has become more confused (described as not being able to dial the telephone or turn on the tv), increased leg swelling.  Today pt was being helped from kitchen to bathroom by nurse and states she "just couldn't make it.'  Was assisted to the floor by nurse.      Past Medical History:  Diagnosis Date  . Abnormal EKG    hx of left anterior fasicular block on 04-09-13 ekg epic  . Allergic rhinitis, cause unspecified   . Bilateral leg weakness   . CAD in native artery   . Chronic gastritis 03/09/11   egd by Dr. Gala Romney  . Chronic shoulder pain   . Cirrhosis ([place])   . Cirrhosis of liver (HCC)    NASH, afp on 06/17/12 =3.7, per pt she had hep B vaccines in 1996, hep A in process.   . Colon adenomas 03/08/10   tcs by Dr. Gala Romney  . Diverticula of colon 03/08/10   L side  . Esophagitis, erosive 03/08/10  . GERD (gastroesophageal reflux disease)   . History of hemorrhoids 03/08/10   tcs- internal and external  . Hyperplastic polyps of stomach 03/08/10   tcs by Dr. Dudley Major  . Intrinsic asthma, unspecified   . Obesity, unspecified   . Other and unspecified hyperlipidemia   . Pancreatitis   . Pancytopenia, acquired ([place]) 04/12/2014  . Personal history of neurosis   . Thrombocytopenia ([place]) 04/12/2014  . Thrombocytopenia, unspecified   . Type II or unspecified type diabetes  mellitus without mention of complication, not stated as uncontrolled   . Unspecified essential hypertension   . Unspecified fall   . Unspecified hereditary and idiopathic peripheral neuropathy   . Vertigo   . Wears glasses     Patient Active Problem List   Diagnosis Date Noted  . Encephalopathy 08/03/2016  . Hepatic encephalopathy ([place])   . Leukopenia 08/02/2016  . Uncontrolled type 2 diabetes mellitus with complication, with long-term current use of insulin ([place]) 04/23/2016  . UTI (lower urinary tract infection)   . Fall 12/24/2014  . Encounter for examination for admission to nursing home 12/16/2014  . Hyperammonemia ([place]) 12/16/2014  . Weakness 12/15/2014  . Physical deconditioning 08/18/2014  . Morbid obesity ([place]) 08/18/2014  . Elevated LFTs 07/29/2014  . Left-sided weakness 07/12/2014  . Pancreatitis, acute 04/12/2014  . Pancytopenia, acquired ([place]) 04/12/2014  . Poor balance 10/14/2013  . Acute encephalopathy 07/30/2013  . Encephalopathy, hepatic ([place]) 07/30/2013  . Confusion 07/29/2013  . Edema of left lower extremity 04/14/2013  . Generalized weakness 02/19/2013  . UTI (urinary tract infection) 02/14/2013  . Fracture of humeral shaft, right, closed 02/11/2013  . Multiple falls 01/26/2013  . Difficulty walking 01/26/2013  . Dizziness 09/02/2012  . Frequent falls 08/08/2012  . Ataxia 08/08/2012  . Peripheral neuropathy ([place]) 08/08/2012  . 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 Normocytic anemia 04/08/2012  . Posterior tibial tendon dysfunction 07/12/2011  . Hyperlipidemia 05/31/2011  . Essential hypertension 05/31/2011  . GERD 09/29/2010  . Liver cirrhosis secondary to NASH ([place]) 09/29/2010  . History of colonic polyps 02/13/2010  . THROMBOCYTOPENIA, CHRONIC 01/04/2010  . PERIPHERAL NEUROPATHY 01/04/2010  . CAD, NATIVE VESSEL 01/04/2010  . ANXIETY DISORDER, HX OF 01/04/2010  . Intrinsic asthma, unspecified 03/16/2009  . ALLERGIC  RHINITIS 02/18/2009    Past Surgical History:  Procedure Laterality Date  . ABDOMINAL HYSTERECTOMY    . BREAST SURGERY Right few yrs ago   benign area removed  . CARDIAC CATHETERIZATION  02/25/2007   Dr. [name]:  normal LV systolic function, mild irregularities of LAD  . COLONOSCOPY  03/08/10   Dr. [name]-->ext/int hemorrhoids, anal paipilla, rectal polyps, desc polyps, cecal polyp, left-sided diverticula. suboptiomal prep. next TCS 02/2015. multiple adenomas  . ESOPHAGOGASTRODUODENOSCOPY  03/08/10   Dr. [name] erosive RE, small hh, antral erosions, small 1cm are of mucosal indentation along gastric body of doubtful significance, cystic nodularity of hypophyarynx, base of tongue, base of epiglottis. benign gastric biopsies  . ESOPHAGOGASTRODUODENOSCOPY  10/08/2011   Dr. [name] esophageal varices, antral erosion. next egd 09/2013  . ESOPHAGOGASTRODUODENOSCOPY N/A 02/17/2014   Dr. Gala Romney: portal gastropathy, no varices. Due for surveillance 2017  . EYE SURGERY     cataracts bilateral  . FOOT SURGERY  2003   lt foot  . hysterectomy and btl     s/p  . ORIF HUMERUS FRACTURE Right 02/17/2013   Procedure: OPEN REDUCTION INTERNAL FIXATION (ORIF) RIGHT PROXIMAL HUMERUS FRACTURE;  Surgeon: [name], MD;  Location: [facility];  Service: Orthopedics;  Laterality: Right;  . PARTIAL GASTRECTOMY     family denies  . TUBAL LIGATION    . TUMOR EXCISION  2003   rt arm and left foot    OB History    No data available       Home Medications    Prior to Admission medications   Medication Sig Start Date End Date Taking? Authorizing Provider  ALPRAZolam Duanne Moron) 0.5 MG tablet Take 1 tablet (0.5 mg total) by mouth 3 (three) times daily as needed for anxiety. Patient taking differently: Take 0.5 mg by mouth 2 (two) times daily.  12/24/14  Yes [name], MD  aspirin EC 81 MG tablet Take 81 mg by mouth daily.   Yes Historical Provider, MD  budesonide-formoterol (SYMBICORT) 160-4.5 MCG/ACT  inhaler Inhale 2 puffs into the lungs at bedtime. 08/06/16  Yes Kathie Dike, MD  diphenhydrAMINE (BENADRYL) 25 MG tablet Take 25 mg by mouth every 6 (six) hours as needed.   Yes Historical Provider, MD  enalapril (VASOTEC) 5 MG tablet Take 1 tablet by mouth daily. 08/25/14  Yes Historical Provider, MD  furosemide (LASIX) 20 MG tablet Take 1 tablet (20 mg total) by mouth daily as needed for fluid. 08/06/16  Yes Kathie Dike, MD  HUMALOG KWIKPEN 100 UNIT/ML KiwkPen INJECT 10-16 UNITS INTO THE SKIN THREE TIMES DAILY 07/12/16  Yes Cassandria Anger, MD  lactulose (CHRONULAC) 10 GM/15ML solution Take 20 g by mouth 3 (three) times daily.  10/20/14  Yes Historical Provider, MD  LEVEMIR FLEXTOUCH 100 UNIT/ML Pen INJECT 50 UNITS INTO THE SKIN DAILY AT 10PM 04/11/16  Yes Cassandria Anger, MD  NEXIUM 40 MG capsule Take 40 mg by mouth every morning.  06/17/14  Yes Historical Provider, MD  ONETOUCH VERIO test strip USE UP TO  FOUR TIMES DAILY AS DIRECTED 07/02/16  Yes Cassandria Anger, MD  oxyCODONE (OXY IR/ROXICODONE) 5 MG immediate release tablet Take 5 mg by mouth 2 (two) times daily as needed for moderate pain or severe pain.  03/16/16  Yes Historical Provider, MD  pregabalin (LYRICA) 100 MG capsule Take 100 mg by mouth 2 (two) times daily.   Yes Historical Provider, MD  rifaximin (XIFAXAN) 550 MG TABS tablet Take 1 tablet (550 mg total) by mouth 2 (two) times daily. 06/28/14  Yes Kathie Dike, MD  spironolactone (ALDACTONE) 50 MG tablet TAKE ONE TABLET BY MOUTH ONCE DAILY IN THE MORNING 03/19/16  Yes Mahala Menghini, PA-C  triamcinolone cream (KENALOG) 0.1 % Apply 1 application topically daily as needed (for irritation).  06/26/13  Yes Historical Provider, MD  ursodiol (URSO FORTE) 500 MG tablet Take 500 mg by mouth 2 (two) times daily.   Yes Historical Provider, MD    Family History Family History  Problem Relation Age of Onset  . Coronary artery disease      FH  . Diabetes      FH  . Heart  disease Mother   . Colon cancer Neg Hx     Social History Social History  Substance Use Topics  . Smoking status: Never Smoker  . Smokeless tobacco: Never Used  . Alcohol use No     Allergies   Morphine and related and Compazine [prochlorperazine edisylate]   Review of Systems Review of Systems  All other systems reviewed and are negative.    Physical Exam Updated Vital Signs BP 123/77 (BP Location: Right Arm)   Pulse 65   Temp 98.4 F (36.9 C) (Oral)   Resp 16   Ht 5\' 8"  (1.727 m)   Wt 113.4 kg   SpO2 98%   BMI 38.01 kg/m   Physical Exam  Constitutional: She appears well-developed and well-nourished. No distress.  Morbidly obese   HENT:  Head: Normocephalic and atraumatic.  Neck: Neck supple.  Cardiovascular: Normal rate and regular rhythm.   Pulmonary/Chest: Effort normal and breath sounds normal. No respiratory distress. She has no wheezes.  Abdominal: Soft. She exhibits no distension. There is no tenderness. There is no rebound and no guarding.  Musculoskeletal: She exhibits edema (LE edema, L>R (chronic per husband)).  Neurological: She is alert.  Skin: She is not diaphoretic.  Nursing note and vitals reviewed.    ED Treatments / Results  Labs (all labs ordered are listed, but only abnormal results are displayed) Labs Reviewed  COMPREHENSIVE METABOLIC PANEL - Abnormal; Notable for the following:       Result Value   Glucose, Bld 222 (*)    Total Protein 5.9 (*)    Albumin 3.2 (*)    Total Bilirubin 1.7 (*)    All other components within normal limits  CBC WITH DIFFERENTIAL/PLATELET - Abnormal; Notable for the following:    WBC 3.8 (*)    Hemoglobin 11.9 (*)    Platelets 114 (*)    All other components within normal limits  AMMONIA - Abnormal; Notable for the following:    Ammonia 72 (*)    All other components within normal limits  URINALYSIS, ROUTINE W REFLEX MICROSCOPIC (NOT AT [facility]) - Abnormal; Notable for the following:    APPearance  CLOUDY (*)    Nitrite POSITIVE (*)    Leukocytes, UA MODERATE (*)    All other components within normal limits  URINE MICROSCOPIC-ADD ON - Abnormal; Notable for the following:  Squamous Epithelial / LPF 0-5 (*)    Bacteria, UA MANY (*)    All other components within normal limits  URINE CULTURE    EKG  EKG Interpretation  Date/Time:  Friday September 28 2016 10:04:49 EST Ventricular Rate:  67 PR Interval:    QRS Duration: 118 QT Interval:  426 QTC Calculation: 450 R Axis:   -107 Text Interpretation:  Sinus rhythm Left anterior fascicular block Nonspecific T abnrm, anterolateral leads No STEMI. Similar to prior.  Confirmed by LONG MD, JOSHUA [phone]) on 09/28/2016 10:19:46 AM       Radiology Dg Chest Portable 1 View  Result Date: 09/28/2016 CLINICAL DATA:  Cough, wheezing and weakness for 1 week. EXAM: PORTABLE CHEST 1 VIEW COMPARISON:  08/02/2016 FINDINGS: Enlargement of the cardiac silhouette is grossly stable. Few densities in the right lower lung may be related to atelectasis. Upper lungs are clear. Again noted are postsurgical changes in the right humerus with a fractured surgical plate. Patient is rotated towards the left. IMPRESSION: Stable cardiomegaly without acute findings. Electronically Signed   By: Markus Daft M.D.   On: 09/28/2016 10:53    Procedures Procedures (including critical care time)  Medications Ordered in ED Medications  cefTRIAXone (ROCEPHIN) 1 g in dextrose 5 % 50 mL IVPB (not administered)  mometasone-formoterol (DULERA) 200-5 MCG/ACT inhaler 2 puff (not administered)  diphenhydrAMINE (BENADRYL) capsule 25 mg (not administered)  furosemide (LASIX) tablet 20 mg (not administered)  insulin detemir (LEVEMIR) injection 50 Units (not administered)  oxyCODONE (Oxy IR/ROXICODONE) immediate release tablet 5 mg (not administered)  spironolactone (ALDACTONE) tablet 50 mg (not administered)  aspirin EC tablet 81 mg (81 mg Oral Given 09/28/16 1511)    ALPRAZolam (XANAX) tablet 0.5 mg (0.5 mg Oral Given 09/28/16 1512)  lactulose (CHRONULAC) 10 GM/15ML solution 20 g (20 g Oral Given 09/28/16 1512)  enalapril (VASOTEC) tablet 5 mg (5 mg Oral Given 09/28/16 1511)  pregabalin (LYRICA) capsule 100 mg (100 mg Oral Given 09/28/16 1511)  ursodiol (ACTIGALL) capsule 600 mg (not administered)  pantoprazole (PROTONIX) EC tablet 80 mg (80 mg Oral Given 09/28/16 1511)  rifaximin (XIFAXAN) tablet 550 mg (550 mg Oral Given 09/28/16 1511)  enoxaparin (LOVENOX) injection 40 mg (40 mg Subcutaneous Given 09/28/16 1512)  0.9 % NaCl with KCl 20 mEq/ L  infusion ( Intravenous New Bag/Given 09/28/16 1523)  acetaminophen (TYLENOL) tablet 325 mg (not administered)    Or  acetaminophen (TYLENOL) suppository 650 mg (not administered)  ondansetron (ZOFRAN) tablet 4 mg (not administered)    Or  ondansetron (ZOFRAN) injection 4 mg (not administered)  cefTRIAXone (ROCEPHIN) 1 g in dextrose 5 % 50 mL IVPB (0 g Intravenous Stopped 09/28/16 1255)     Initial Impression / Assessment and Plan / ED Course  I have reviewed the triage vital signs and the nursing notes.  Pertinent labs & imaging results that were available during my care of the patient were reviewed by me and considered in my medical decision making (see chart for details).  Clinical Course as of Sep 28 1608  Fri Sep 28, 2016  1144 Last urine culture grew Klebsiella, sensitive to ceftriaxone   [EW]  1228 Admitted to Dr Caryn Section, [facility].    [EW]    Clinical Course User Index [EW] Clayton Bibles, PA-C    Pt with multiple medical problems including cirrhosis, DM, asthma, morbid obesity p/w malodorous urine, generalized weakness, perhaps worsening chronic confusion Found to have urinary tract infection, Rocephin ordered.  Does have slight  increase in ammonia, 72. Hyperglycemic at 22 without gap.  Admitted to [facility].     Final Clinical Impressions(s) / ED Diagnoses   Final diagnoses:   Urinary tract infection without hematuria, site unspecified  Generalized weakness    New Prescriptions Current Discharge Medication List       Clayton Bibles, [place] 09/28/16 [place], MD 09/28/16 [phone]

## 2016-09-28 NOTE — ED Notes (Signed)
Assisted by in and out cath, bottum is red and irritated, White cream has been applied before coming in

## 2016-09-29 ENCOUNTER — Inpatient Hospital Stay (HOSPITAL_COMMUNITY): Payer: Medicare Other

## 2016-09-29 DIAGNOSIS — N390 Urinary tract infection, site not specified: Secondary | ICD-10-CM | POA: Diagnosis not present

## 2016-09-29 DIAGNOSIS — R600 Localized edema: Secondary | ICD-10-CM | POA: Diagnosis not present

## 2016-09-29 DIAGNOSIS — R531 Weakness: Secondary | ICD-10-CM | POA: Diagnosis not present

## 2016-09-29 LAB — COMPREHENSIVE METABOLIC PANEL
ALBUMIN: 2.7 g/dL — AB (ref 3.5–5.0)
ALT: 11 U/L — ABNORMAL LOW (ref 14–54)
ANION GAP: 5 (ref 5–15)
AST: 15 U/L (ref 15–41)
Alkaline Phosphatase: 66 U/L (ref 38–126)
BUN: 14 mg/dL (ref 6–20)
CO2: 26 mmol/L (ref 22–32)
Calcium: 8.4 mg/dL — ABNORMAL LOW (ref 8.9–10.3)
Chloride: 106 mmol/L (ref 101–111)
Creatinine, Ser: 0.69 mg/dL (ref 0.44–1.00)
GFR calc Af Amer: >60 mL/min (ref >60)
GFR calc non Af Amer: >60 mL/min (ref >60)
GLUCOSE: 118 mg/dL — AB (ref 65–99)
POTASSIUM: 4.2 mmol/L (ref 3.5–5.1)
SODIUM: 137 mmol/L (ref 135–145)
TOTAL PROTEIN: 4.9 g/dL — AB (ref 6.5–8.1)
Total Bilirubin: 1.4 mg/dL — ABNORMAL HIGH (ref 0.3–1.2)

## 2016-09-29 LAB — CBC
HEMATOCRIT: 32.9 % — AB (ref 36.0–46.0)
HEMOGLOBIN: 10.5 g/dL — AB (ref 12.0–15.0)
MCH: 25.9 pg — AB (ref 26.0–34.0)
MCHC: 31.9 g/dL (ref 30.0–36.0)
MCV: 81.2 fL (ref 78.0–100.0)
Platelets: 111 10*3/uL — ABNORMAL LOW (ref 150–400)
RBC: 4.05 MIL/uL (ref 3.87–5.11)
RDW: 15 % (ref 11.5–15.5)
WBC: 3.3 10*3/uL — ABNORMAL LOW (ref 4.0–10.5)

## 2016-09-29 LAB — GLUCOSE, CAPILLARY
GLUCOSE-CAPILLARY: 121 mg/dL — AB (ref 65–99)
GLUCOSE-CAPILLARY: 265 mg/dL — AB (ref 65–99)
Glucose-Capillary: 226 mg/dL — ABNORMAL HIGH (ref 65–99)
Glucose-Capillary: 206 mg/dL — ABNORMAL HIGH (ref 65–99)

## 2016-09-29 LAB — TSH: TSH: 2.017 u[IU]/mL (ref 0.350–4.500)

## 2016-09-29 LAB — AMMONIA: Ammonia: 104 umol/L — ABNORMAL HIGH (ref 9–35)

## 2016-09-29 MED ORDER — LACTULOSE 10 GM/15ML PO SOLN
30.0000 g | Freq: Three times a day (TID) | ORAL | Status: DC
  Administered 2016-09-29 – 2016-09-30 (×3): 30 g via ORAL
  Filled 2016-09-29 (×3): qty 60

## 2016-09-29 NOTE — Progress Notes (Signed)
PROGRESS NOTE    [name] [name]  [id number] DOB: 09-18-1941 DOA: 09/28/2016 PCP: Purvis Kilts, MD    Brief Narrative: [name] [name] is a 75 y.o. female with medical history significant for NASH cirrhosis, chronic bilateral lower extremity weakness from neuropathy necessitating a mostly nonambulatory state, obesity, recurrent urinary tract infections, DM-2, and CAD, who presented with a chief complaint of generalized weakness and a controlled fall at home. In the ED, she was afebrile and hemodynamically stable. Her lab data were significant for a glucose of 222, ammonia of 72, total bilirubin of 1.7, normal LFTs otherwise, WBC of 3.8, and platelet count of 114. Her urinalysis was grossly infected appearing. She was admitted for further evaluation and management.    Assessment & Plan:   Principal Problem:   UTI (urinary tract infection) Active Problems:   Liver cirrhosis secondary to NASH (HCC)   Diabetes mellitus with neuropathy (HCC)   Leg edema, left   Thrombocytopenia (HCC)   Hereditary and idiopathic peripheral neuropathy   Essential hypertension   Normocytic anemia   Difficulty walking   Generalized weakness   Morbid obesity ([place])   1. Urinary tract infection causing generalized weakness. Urine culture was ordered and she has been started on Rocephin.Supportive treatment and gentle IV fluids were started as well. For further evaluation of generalized weakness, TSH was ordered and was within normal limits. Urine culture results are still pending. -Patient appears symptomatically improved.  Type 2 diabetes mellitus with neuropathy. Patient is treated chronically with Humalog sliding scale 3 times a day and 50 units of Levemir daily at bedtime. She is also treated with Lyrica. -She was restarted on Levemir and a sliding scale NovoLog regimen was ordered. We'll continue Lyrica. -Hemoglobin A1c ordered and is pending.  Left lower extremity edema. -Lower  extremity venous ultrasound was negative for DVT. Upon further discussion with the patient, apparently she has chronic edema in her left leg, possibly secondary to mild lymphedema.  Nash cirrhosis. -Currently stable. Her ammonia level on admission was 72. She was restarted on lactulose, ursodiol, and rifaximin. Spironolactone was withheld until 09/29/16. -Her ammonia level has increased to 104, but she does not appear encephalopathic. Nevertheless, we will increase dosing of lactulose.  Hypertension. She is treated chronically with enalapril. It was reordered. Her blood pressure has been stable.  Chronic thrombocytopenia and leukopenia. -Secondary to cirrhosis. Currently at baseline.  Difficulty with ambulating/mostly chronically bedridden secondary to morbid obesity and peripheral neuropathy.   DVT prophylaxis: Lovenox Code Status: Full code Family Communication: Discussed with husband on 09/28/16 Disposition Plan: Discharge to home when clinically appropriate   Consultants:   None  Procedures:  None  Antimicrobials:  Rocephin 11/17>>   Subjective: Patient says that she had a good night. She denies chest pain, chest congestion, or abdominal pain.  Objective: Vitals:   09/28/16 2108 09/29/16 0618 09/29/16 0812 09/29/16 0839  BP: 128/67 110/64  (!) 146/57  Pulse: 66 67  73  Resp: 18 18  18   Temp: 98.2 F (36.8 C) 98.1 F (36.7 C)    TempSrc: Oral Oral    SpO2: 97% 98% 96% 97%  Weight:      Height:        Intake/Output Summary (Last 24 hours) at 09/29/16 1422 Last data filed at 09/29/16 1221  Gross per 24 hour  Intake          1150.83 ml  Output  0 ml  Net          1150.83 ml   Filed Weights   09/28/16 1006  Weight: 113.4 kg (250 lb)    Examination:  General exam: Appears calm and comfortable; Overall the patient looks better today.  Respiratory system: Clear to auscultation. Respiratory effort normal. Cardiovascular system: S1 & S2  heard, RRR. No JVD, murmurs, rubs, gallops or clicks. Trace right lower extremity edema and trace to 1+ left lower extremity edema; nonpitting. Gastrointestinal system: Abdomen is obese, nondistended, soft and nontender. No organomegaly or masses felt. Normal bowel sounds heard. Central nervous system: Alert and oriented. No focal neurological deficits. Extremities: No acute hot red joints Skin: No rashes, lesions or ulcers Psychiatry: Judgement and insight appear normal. Mood & affect appropriate.     Data Reviewed: I have personally reviewed following labs and imaging studies  CBC:  Recent Labs Lab 09/28/16 1026 09/29/16 0640  WBC 3.8* 3.3*  NEUTROABS 2.3  --   HGB 11.9* 10.5*  HCT 36.6 32.9*  MCV 81.7 81.2  PLT 114* [id number]*   Basic Metabolic Panel:  Recent Labs Lab 09/28/16 1026 09/29/16 0640  NA 136 137  K 3.9 4.2  CL 102 106  CO2 28 26  GLUCOSE 222* 118*  BUN 15 14  CREATININE 0.73 0.69  CALCIUM 8.9 8.4*   GFR: Estimated Creatinine Clearance: 82.8 mL/min (by C-G formula based on SCr of 0.69 mg/dL). Liver Function Tests:  Recent Labs Lab 09/28/16 1026 09/29/16 0640  AST 17 15  ALT 14 11*  ALKPHOS 79 66  BILITOT 1.7* 1.4*  PROT 5.9* 4.9*  ALBUMIN 3.2* 2.7*   No results for input(s): LIPASE, AMYLASE in the last 168 hours.  Recent Labs Lab 09/28/16 1026 09/29/16 0640  AMMONIA 72* 104*   Coagulation Profile: No results for input(s): INR, PROTIME in the last 168 hours. Cardiac Enzymes: No results for input(s): CKTOTAL, CKMB, CKMBINDEX, TROPONINI in the last 168 hours. BNP (last 3 results) No results for input(s): PROBNP in the last 8760 hours. HbA1C: No results for input(s): HGBA1C in the last 72 hours. CBG:  Recent Labs Lab 09/28/16 1625 09/28/16 2114 09/29/16 0755 09/29/16 1121  GLUCAP 232* 223* 121* 226*   Lipid Profile: No results for input(s): CHOL, HDL, LDLCALC, TRIG, CHOLHDL, LDLDIRECT in the last 72 hours. Thyroid Function  Tests:  Recent Labs  09/29/16 0640  TSH 2.017   Anemia Panel: No results for input(s): VITAMINB12, FOLATE, FERRITIN, TIBC, IRON, RETICCTPCT in the last 72 hours. Sepsis Labs: No results for input(s): PROCALCITON, LATICACIDVEN in the last 168 hours.  No results found for this or any previous visit (from the past 240 hour(s)).       Radiology Studies: US Venous Img Lower Unilateral Left  Result Date: 09/29/2016 CLINICAL DATA:  Left lower extremity pain and edema following fall. Evaluate for DVT. EXAM: LEFT LOWER EXTREMITY VENOUS DOPPLER ULTRASOUND TECHNIQUE: Gray-scale sonography with graded compression, as well as color Doppler and duplex ultrasound were performed to evaluate the lower extremity deep venous systems from the level of the common femoral vein and including the common femoral, femoral, profunda femoral, popliteal and calf veins including the posterior tibial, peroneal and gastrocnemius veins when visible. The superficial great saphenous vein was also interrogated. Spectral Doppler was utilized to evaluate flow at rest and with distal augmentation maneuvers in the common femoral, femoral and popliteal veins. COMPARISON:  Left lower extremity venous Doppler ultrasound - 08/04/2016 ; 07/12/2014 FINDINGS: Examination is degraded due  to patient body habitus and poor sonographic window. Contralateral Common Femoral Vein: Respiratory phasicity is normal and symmetric with the symptomatic side. No evidence of thrombus. Normal compressibility. Common Femoral Vein: No evidence of thrombus. Normal compressibility, respiratory phasicity and response to augmentation. Saphenofemoral Junction: No evidence of thrombus. Normal compressibility and flow on color Doppler imaging. Profunda Femoral Vein: No evidence of thrombus. Normal compressibility and flow on color Doppler imaging. Femoral Vein: No evidence of thrombus. Normal compressibility, respiratory phasicity and response to augmentation.  Popliteal Vein: No evidence of thrombus. Normal compressibility, respiratory phasicity and response to augmentation. Calf Veins: Appear patent where imaged. Superficial Great Saphenous Vein: No evidence of thrombus. Normal compressibility and flow on color Doppler imaging. Venous Reflux:  None. Other Findings:  None. IMPRESSION: No evidence of DVT within the left lower extremity. Electronically Signed   By: Sandi Mariscal M.D.   On: 09/29/2016 09:47   Dg Chest Portable 1 View  Result Date: 09/28/2016 CLINICAL DATA:  Cough, wheezing and weakness for 1 week. EXAM: PORTABLE CHEST 1 VIEW COMPARISON:  08/02/2016 FINDINGS: Enlargement of the cardiac silhouette is grossly stable. Few densities in the right lower lung may be related to atelectasis. Upper lungs are clear. Again noted are postsurgical changes in the right humerus with a fractured surgical plate. Patient is rotated towards the left. IMPRESSION: Stable cardiomegaly without acute findings. Electronically Signed   By: Markus Daft M.D.   On: 09/28/2016 10:53        Scheduled Meds: . ALPRAZolam  0.5 mg Oral BID  . aspirin EC  81 mg Oral Daily  . cefTRIAXone (ROCEPHIN)  IV  1 g Intravenous Q24H  . enalapril  5 mg Oral Daily  . enoxaparin (LOVENOX) injection  40 mg Subcutaneous Q24H  . insulin aspart  0-15 Units Subcutaneous TID WC  . insulin aspart  0-5 Units Subcutaneous QHS  . insulin detemir  50 Units Subcutaneous Q2200  . lactulose  20 g Oral TID  . mometasone-formoterol  2 puff Inhalation BID  . pantoprazole  80 mg Oral Q1200  . pregabalin  100 mg Oral BID  . rifaximin  550 mg Oral BID  . spironolactone  50 mg Oral Daily  . ursodiol  600 mg Oral BID   Continuous Infusions: . 0.9 % NaCl with KCl 20 mEq / L 50 mL/hr (09/29/16 0546)     LOS: 1 day    Time spent: 23 minutes    Rexene Alberts, MD [facility] Pager [phone]  If 7PM-7AM, please contact night-coverage [email or web address] Password [email or web address] 09/29/2016, 2:22 PM

## 2016-09-30 DIAGNOSIS — R531 Weakness: Secondary | ICD-10-CM | POA: Diagnosis not present

## 2016-09-30 DIAGNOSIS — N390 Urinary tract infection, site not specified: Secondary | ICD-10-CM | POA: Diagnosis not present

## 2016-09-30 DIAGNOSIS — R600 Localized edema: Secondary | ICD-10-CM | POA: Diagnosis not present

## 2016-09-30 DIAGNOSIS — D649 Anemia, unspecified: Secondary | ICD-10-CM

## 2016-09-30 LAB — BASIC METABOLIC PANEL
ANION GAP: 6 (ref 5–15)
BUN: 12 mg/dL (ref 6–20)
CALCIUM: 8.6 mg/dL — AB (ref 8.9–10.3)
CO2: 24 mmol/L (ref 22–32)
Chloride: 107 mmol/L (ref 101–111)
Creatinine, Ser: 0.67 mg/dL (ref 0.44–1.00)
Glucose, Bld: 191 mg/dL — ABNORMAL HIGH (ref 65–99)
Potassium: 4.2 mmol/L (ref 3.5–5.1)
Sodium: 137 mmol/L (ref 135–145)

## 2016-09-30 LAB — HEMOGLOBIN A1C
Hgb A1c MFr Bld: 6.1 % — ABNORMAL HIGH (ref 4.8–5.6)
MEAN PLASMA GLUCOSE: 128 mg/dL

## 2016-09-30 LAB — GLUCOSE, CAPILLARY
GLUCOSE-CAPILLARY: 308 mg/dL — AB (ref 65–99)
Glucose-Capillary: 172 mg/dL — ABNORMAL HIGH (ref 65–99)

## 2016-09-30 LAB — AMMONIA: Ammonia: 38 umol/L — ABNORMAL HIGH (ref 9–35)

## 2016-09-30 MED ORDER — ALPRAZOLAM 0.5 MG PO TABS: 0.5000 mg | ORAL_TABLET | Freq: Two times a day (BID) | ORAL | Status: AC

## 2016-09-30 MED ORDER — CEFUROXIME AXETIL 250 MG PO TABS
250.0000 mg | ORAL_TABLET | Freq: Two times a day (BID) | ORAL | 0 refills | Status: DC
Start: 2016-09-30 — End: 2016-10-23

## 2016-09-30 MED ORDER — INSULIN ASPART 100 UNIT/ML ~~LOC~~ SOLN
7.0000 [IU] | Freq: Three times a day (TID) | SUBCUTANEOUS | Status: DC
  Administered 2016-09-30: 7 [IU] via SUBCUTANEOUS

## 2016-09-30 NOTE — Discharge Summary (Signed)
Physician Discharge Summary  [name] [name] [id number] DOB: 10-24-1941 DOA: 09/28/2016  PCP: Purvis Kilts, MD  Admit date: 09/28/2016 Discharge date: 09/30/2016  Time spent: Greater than 30 minutes  Recommendations for Outpatient Follow-up:  1. Recommend follow-up of the urine culture sensitivities.   Discharge Diagnoses:  1. Urinary tract infection secondary to Escherichia coli, causing generalized weakness. 2. Type 2 diabetes with neuropathy. 3. Chronic left lower extremity edema. Venous ultrasound negative for DVT. 4. Nash cirrhosis. 5. Essential hypertension. 6. 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 7. Normocytic anemia. 8. Morbid obesity. 9. Chronically bedridden secondary to morbid obesity and peripheral neuropathy.   Discharge Condition: Improved.  Diet recommendation: Heart healthy/, hydrated modified.  Filed Weights   09/28/16 1006 09/30/16 0647  Weight: 113.4 kg (250 lb) 112.5 kg (248 lb)    History of present illness:  [name] [name] a 75 y.o.femalewith medical history significant for NASHcirrhosis, chronic bilateral lower extremity weakness from neuropathy necessitating a mostly nonambulatory state, obesity, recurrent urinary tract infections, DM-2, and CAD, who presented with a chief complaint of generalized weakness and a controlled fall at home. In the ED, she was afebrile and hemodynamically stable. Her lab data were significant for a glucose of 222, ammonia of 72, total bilirubin of 1.7, normal LFTs otherwise, WBC of 3.8, and platelet count of 114. Her urinalysis was grossly infected appearing. She was admitted for further evaluation and management.    Hospital Course:  1. Urinary tract infection causing generalized weakness. Urine culture was ordered and she has been started on Rocephin.Supportive treatment and gentle IV fluids were started as well. For further evaluation of generalized weakness, TSH was ordered and  it was within normal limits. Urine culture grew out greater than 100,000 colonies of Escherichia coli, but the sensitivities were pending at the time of discharge. -She improved clinically and symptomatically. She received 3 doses of Rocephin and was discharged on 4 more days of Ceftin.  Type 2 diabetes mellitus with neuropathy. Patient is treated chronically with Humalog sliding scale 3 times a day and 50 units of Levemir daily at bedtime. She is also treated with Lyrica. -She was restarted on Levemir and a sliding scale NovoLog regimen was ordered. Lyrica was continued. -Her capillary blood glucoses trended up, so mealtime coverage of NovoLog was added, when it was noticed that the sliding scale regimen provided less insulin than her home regimen. -Her hemoglobin A1c was 6.1.  Left lower extremity edema. Patient's left leg was slightly more edematous than her right leg. Lower extremity venous ultrasound was ordered and was negative for DVT. Upon further discussion with the patient, apparently she has chronic edema in her left leg, possibly secondary to mild lymphedema.  Nash cirrhosis. Her [name] cirrhosis remained stable. Her ammonia level on admission was 72. She was restarted on lactulose, ursodiol, and rifaximin. Spironolactone was withheld until 09/29/16. -Her ammonia level has increased to 104, but she did not appear encephalopathic. Lactulose dosing with slightly increased. Her ammonia level was 38 at the time of discharge.  Hypertension. She is treated chronically with enalapril. It was reordered. Her blood pressure  remained  stable.  Chronic thrombocytopenia and leukopenia. -Secondary to cirrhosis. Her platelet count was at baseline.  Difficulty with ambulating/mostly chronically bedriddensecondary to morbid obesity and peripheral neuropathy. Patient does not appear to be a rehabilitation candidate due to her chronically bedridden  state.    Procedures:  None  Consultations:  None  Discharge Exam: Vitals:   09/29/16 2209 09/30/16  0647  BP: (!) 134/57 (!) 144/73  Pulse: 76 78  Resp: 20 20  Temp: 98 F (36.7 C) 98 F (36.7 C)    General: Morbidly obese 75 year old woman in no acute distress. Cardiovascular: S1, S2, no murmurs rubs or gallops. Trace of pedal edema bilaterally. Respiratory: Decreased breath sounds in bases but clear anteriorly. Breathing nonlabored. Neurologic: She is alert and oriented 2.  Discharge Instructions   Discharge Instructions    Diet - low sodium heart healthy    Complete by:  As directed    Increase activity slowly    Complete by:  As directed      Current Discharge Medication List    START taking these medications   Details  cefUROXime (CEFTIN) 250 MG tablet Take 1 tablet (250 mg total) by mouth 2 (two) times daily with a meal. Start taking antibiotic on 10/01/16 and take until completed. Qty: 8 tablet, Refills: 0      CONTINUE these medications which have CHANGED   Details  ALPRAZolam (XANAX) 0.5 MG tablet Take 1 tablet (0.5 mg total) by mouth 2 (two) times daily.      CONTINUE these medications which have NOT CHANGED   Details  aspirin EC 81 MG tablet Take 81 mg by mouth daily.    budesonide-formoterol (SYMBICORT) 160-4.5 MCG/ACT inhaler Inhale 2 puffs into the lungs at bedtime. Qty: 1 Inhaler, Refills: 12    diphenhydrAMINE (BENADRYL) 25 MG tablet Take 25 mg by mouth every 6 (six) hours as needed.    enalapril (VASOTEC) 5 MG tablet Take 1 tablet by mouth daily.    furosemide (LASIX) 20 MG tablet Take 1 tablet (20 mg total) by mouth daily as needed for fluid. Qty: 30 tablet, Refills: 0    HUMALOG KWIKPEN 100 UNIT/ML KiwkPen INJECT 10-16 UNITS INTO THE SKIN THREE TIMES DAILY Qty: 15 mL, Refills: 2    lactulose (CHRONULAC) 10 GM/15ML solution Take 20 g by mouth 3 (three) times daily.  Refills: 0    LEVEMIR FLEXTOUCH 100 UNIT/ML Pen INJECT 50  UNITS INTO THE SKIN DAILY AT 10PM Qty: 15 mL, Refills: 2    NEXIUM 40 MG capsule Take 40 mg by mouth every morning.     ONETOUCH VERIO test strip USE UP TO FOUR TIMES DAILY AS DIRECTED Qty: 150 each, Refills: 5    oxyCODONE (OXY IR/ROXICODONE) 5 MG immediate release tablet Take 5 mg by mouth 2 (two) times daily as needed for moderate pain or severe pain.     pregabalin (LYRICA) 100 MG capsule Take 100 mg by mouth 2 (two) times daily.    rifaximin (XIFAXAN) 550 MG TABS tablet Take 1 tablet (550 mg total) by mouth 2 (two) times daily. Qty: 60 tablet, Refills: 1    spironolactone (ALDACTONE) 50 MG tablet TAKE ONE TABLET BY MOUTH ONCE DAILY IN THE MORNING Qty: 30 tablet, Refills: 11    triamcinolone cream (KENALOG) 0.1 % Apply 1 application topically daily as needed (for irritation).     ursodiol (URSO FORTE) 500 MG tablet Take 500 mg by mouth 2 (two) times daily.       Allergies  Allergen Reactions  . Morphine And Related Itching  . Compazine [Prochlorperazine Edisylate] Anxiety    Causes anxiety & nervousness   Follow-up Information    [facility]. Schedule an appointment as soon as possible for a visit in 1 week(s).   Specialty:  Family Medicine Contact information: [place] [address] [phone]  The results of significant diagnostics from this hospitalization (including imaging, microbiology, ancillary and laboratory) are listed below for reference.    Significant Diagnostic Studies: US Venous Img Lower Unilateral Left  Result Date: 09/29/2016 CLINICAL DATA:  Left lower extremity pain and edema following fall. Evaluate for DVT. EXAM: LEFT LOWER EXTREMITY VENOUS DOPPLER ULTRASOUND TECHNIQUE: Gray-scale sonography with graded compression, as well as color Doppler and duplex ultrasound were performed to evaluate the lower extremity deep venous systems from the level of the common femoral vein and including the  common femoral, femoral, profunda femoral, popliteal and calf veins including the posterior tibial, peroneal and gastrocnemius veins when visible. The superficial great saphenous vein was also interrogated. Spectral Doppler was utilized to evaluate flow at rest and with distal augmentation maneuvers in the common femoral, femoral and popliteal veins. COMPARISON:  Left lower extremity venous Doppler ultrasound - 08/04/2016 ; 07/12/2014 FINDINGS: Examination is degraded due to patient body habitus and poor sonographic window. Contralateral Common Femoral Vein: Respiratory phasicity is normal and symmetric with the symptomatic side. No evidence of thrombus. Normal compressibility. Common Femoral Vein: No evidence of thrombus. Normal compressibility, respiratory phasicity and response to augmentation. Saphenofemoral Junction: No evidence of thrombus. Normal compressibility and flow on color Doppler imaging. Profunda Femoral Vein: No evidence of thrombus. Normal compressibility and flow on color Doppler imaging. Femoral Vein: No evidence of thrombus. Normal compressibility, respiratory phasicity and response to augmentation. Popliteal Vein: No evidence of thrombus. Normal compressibility, respiratory phasicity and response to augmentation. Calf Veins: Appear patent where imaged. Superficial Great Saphenous Vein: No evidence of thrombus. Normal compressibility and flow on color Doppler imaging. Venous Reflux:  None. Other Findings:  None. IMPRESSION: No evidence of DVT within the left lower extremity. Electronically Signed   By: Sandi Mariscal M.D.   On: 09/29/2016 09:47   Dg Chest Portable 1 View  Result Date: 09/28/2016 CLINICAL DATA:  Cough, wheezing and weakness for 1 week. EXAM: PORTABLE CHEST 1 VIEW COMPARISON:  08/02/2016 FINDINGS: Enlargement of the cardiac silhouette is grossly stable. Few densities in the right lower lung may be related to atelectasis. Upper lungs are clear. Again noted are postsurgical  changes in the right humerus with a fractured surgical plate. Patient is rotated towards the left. IMPRESSION: Stable cardiomegaly without acute findings. Electronically Signed   By: Markus Daft M.D.   On: 09/28/2016 10:53    Microbiology: Recent Results (from the past 240 hour(s))  Urine culture     Status: Abnormal (Preliminary result)   Collection Time: 09/28/16 10:54 AM  Result Value Ref Range Status   Specimen Description URINE, CATHETERIZED  Final   Special Requests NONE  Final   Culture >=100,000 COLONIES/mL GRAM NEGATIVE RODS (A)  Final   Report Status PENDING  Incomplete     Labs: Basic Metabolic Panel:  Recent Labs Lab 09/28/16 1026 09/29/16 0640 09/30/16 0616  NA 136 137 137  K 3.9 4.2 4.2  CL 102 106 107  CO2 28 26 24   GLUCOSE 222* 118* 191*  BUN 15 14 12   CREATININE 0.73 0.69 0.67  CALCIUM 8.9 8.4* 8.6*   Liver Function Tests:  Recent Labs Lab 09/28/16 1026 09/29/16 0640  AST 17 15  ALT 14 11*  ALKPHOS 79 66  BILITOT 1.7* 1.4*  PROT 5.9* 4.9*  ALBUMIN 3.2* 2.7*   No results for input(s): LIPASE, AMYLASE in the last 168 hours.  Recent Labs Lab 09/28/16 1026 09/29/16 0640 09/30/16 0616  AMMONIA 72* 104* 38*  CBC:  Recent Labs Lab 09/28/16 1026 09/29/16 0640  WBC 3.8* 3.3*  NEUTROABS 2.3  --   HGB 11.9* 10.5*  HCT 36.6 32.9*  MCV 81.7 81.2  PLT 114* 111*   Cardiac Enzymes: No results for input(s): CKTOTAL, CKMB, CKMBINDEX, TROPONINI in the last 168 hours. BNP: BNP (last 3 results)  Recent Labs  03/26/16 1456  BNP 59.0    ProBNP (last 3 results) No results for input(s): PROBNP in the last 8760 hours.  CBG:  Recent Labs Lab 09/29/16 1121 09/29/16 1653 09/29/16 2215 09/30/16 0730 09/30/16 1133  GLUCAP 226* 265* 206* 172* 308*       Signed:  Aneliz Carbary MD.  [facility] 09/30/2016, 11:37 AM

## 2016-10-01 ENCOUNTER — Other Ambulatory Visit: Payer: Self-pay | Admitting: "Endocrinology

## 2016-10-01 LAB — URINE CULTURE

## 2016-10-06 ENCOUNTER — Other Ambulatory Visit: Payer: Self-pay | Admitting: "Endocrinology

## 2016-10-08 DIAGNOSIS — E119 Type 2 diabetes mellitus without complications: Secondary | ICD-10-CM | POA: Diagnosis not present

## 2016-10-08 DIAGNOSIS — J45909 Unspecified asthma, uncomplicated: Secondary | ICD-10-CM | POA: Diagnosis not present

## 2016-10-08 DIAGNOSIS — D139 Benign neoplasm of ill-defined sites within the digestive system: Secondary | ICD-10-CM | POA: Diagnosis not present

## 2016-10-08 DIAGNOSIS — Z7982 Long term (current) use of aspirin: Secondary | ICD-10-CM | POA: Diagnosis not present

## 2016-10-08 DIAGNOSIS — Z7951 Long term (current) use of inhaled steroids: Secondary | ICD-10-CM | POA: Diagnosis not present

## 2016-10-08 DIAGNOSIS — E784 Other hyperlipidemia: Secondary | ICD-10-CM | POA: Diagnosis not present

## 2016-10-08 DIAGNOSIS — I2510 Atherosclerotic heart disease of native coronary artery without angina pectoris: Secondary | ICD-10-CM | POA: Diagnosis not present

## 2016-10-08 DIAGNOSIS — I10 Essential (primary) hypertension: Secondary | ICD-10-CM | POA: Diagnosis not present

## 2016-10-08 DIAGNOSIS — K7460 Unspecified cirrhosis of liver: Secondary | ICD-10-CM | POA: Diagnosis not present

## 2016-10-13 DIAGNOSIS — E1142 Type 2 diabetes mellitus with diabetic polyneuropathy: Secondary | ICD-10-CM | POA: Diagnosis not present

## 2016-10-13 DIAGNOSIS — E7220 Disorder of urea cycle metabolism, unspecified: Secondary | ICD-10-CM | POA: Diagnosis not present

## 2016-10-17 DIAGNOSIS — N760 Acute vaginitis: Secondary | ICD-10-CM | POA: Diagnosis not present

## 2016-10-17 DIAGNOSIS — G894 Chronic pain syndrome: Secondary | ICD-10-CM | POA: Diagnosis not present

## 2016-10-17 DIAGNOSIS — N390 Urinary tract infection, site not specified: Secondary | ICD-10-CM | POA: Diagnosis not present

## 2016-10-17 DIAGNOSIS — Z1389 Encounter for screening for other disorder: Secondary | ICD-10-CM | POA: Diagnosis not present

## 2016-10-19 ENCOUNTER — Emergency Department (HOSPITAL_COMMUNITY): Payer: Medicare Other

## 2016-10-19 ENCOUNTER — Encounter (HOSPITAL_COMMUNITY): Payer: Self-pay | Admitting: Emergency Medicine

## 2016-10-19 ENCOUNTER — Inpatient Hospital Stay (HOSPITAL_COMMUNITY)
Admission: EM | Admit: 2016-10-19 | Discharge: 2016-10-23 | DRG: 871 | Disposition: A | Discharge status: Home Health | Payer: Medicare Other | Attending: Internal Medicine | Admitting: Internal Medicine

## 2016-10-19 DIAGNOSIS — E8770 Fluid overload, unspecified: Secondary | ICD-10-CM | POA: Diagnosis not present

## 2016-10-19 DIAGNOSIS — M6281 Muscle weakness (generalized): Secondary | ICD-10-CM

## 2016-10-19 DIAGNOSIS — K219 Gastro-esophageal reflux disease without esophagitis: Secondary | ICD-10-CM | POA: Diagnosis not present

## 2016-10-19 DIAGNOSIS — D649 Anemia, unspecified: Secondary | ICD-10-CM | POA: Diagnosis present

## 2016-10-19 DIAGNOSIS — Z6837 Body mass index (BMI) 37.0-37.9, adult: Secondary | ICD-10-CM

## 2016-10-19 DIAGNOSIS — E0840 Diabetes mellitus due to underlying condition with diabetic neuropathy, unspecified: Secondary | ICD-10-CM | POA: Diagnosis not present

## 2016-10-19 DIAGNOSIS — M169 Osteoarthritis of hip, unspecified: Secondary | ICD-10-CM | POA: Diagnosis not present

## 2016-10-19 DIAGNOSIS — N390 Urinary tract infection, site not specified: Secondary | ICD-10-CM | POA: Diagnosis present

## 2016-10-19 DIAGNOSIS — K7581 Nonalcoholic steatohepatitis (NASH): Secondary | ICD-10-CM | POA: Diagnosis not present

## 2016-10-19 DIAGNOSIS — E872 Acidosis, unspecified: Secondary | ICD-10-CM | POA: Diagnosis present

## 2016-10-19 DIAGNOSIS — B9620 Unspecified Escherichia coli [E. coli] as the cause of diseases classified elsewhere: Secondary | ICD-10-CM | POA: Diagnosis present

## 2016-10-19 DIAGNOSIS — R509 Fever, unspecified: Secondary | ICD-10-CM

## 2016-10-19 DIAGNOSIS — E785 Hyperlipidemia, unspecified: Secondary | ICD-10-CM | POA: Diagnosis present

## 2016-10-19 DIAGNOSIS — D696 Thrombocytopenia, unspecified: Secondary | ICD-10-CM | POA: Diagnosis not present

## 2016-10-19 DIAGNOSIS — G609 Hereditary and idiopathic neuropathy, unspecified: Secondary | ICD-10-CM | POA: Diagnosis present

## 2016-10-19 DIAGNOSIS — A419 Sepsis, unspecified organism: Principal | ICD-10-CM | POA: Diagnosis present

## 2016-10-19 DIAGNOSIS — Z79899 Other long term (current) drug therapy: Secondary | ICD-10-CM

## 2016-10-19 DIAGNOSIS — Z1611 Resistance to penicillins: Secondary | ICD-10-CM | POA: Diagnosis present

## 2016-10-19 DIAGNOSIS — G9341 Metabolic encephalopathy: Secondary | ICD-10-CM | POA: Diagnosis present

## 2016-10-19 DIAGNOSIS — I2510 Atherosclerotic heart disease of native coronary artery without angina pectoris: Secondary | ICD-10-CM

## 2016-10-19 DIAGNOSIS — I959 Hypotension, unspecified: Secondary | ICD-10-CM | POA: Diagnosis not present

## 2016-10-19 DIAGNOSIS — E1140 Type 2 diabetes mellitus with diabetic neuropathy, unspecified: Secondary | ICD-10-CM | POA: Diagnosis not present

## 2016-10-19 DIAGNOSIS — E871 Hypo-osmolality and hyponatremia: Secondary | ICD-10-CM | POA: Diagnosis present

## 2016-10-19 DIAGNOSIS — Z7401 Bed confinement status: Secondary | ICD-10-CM | POA: Diagnosis not present

## 2016-10-19 DIAGNOSIS — R279 Unspecified lack of coordination: Secondary | ICD-10-CM | POA: Diagnosis not present

## 2016-10-19 DIAGNOSIS — G9340 Encephalopathy, unspecified: Secondary | ICD-10-CM | POA: Diagnosis not present

## 2016-10-19 DIAGNOSIS — F0390 Unspecified dementia without behavioral disturbance: Secondary | ICD-10-CM | POA: Diagnosis present

## 2016-10-19 DIAGNOSIS — Z90710 Acquired absence of both cervix and uterus: Secondary | ICD-10-CM

## 2016-10-19 DIAGNOSIS — Z833 Family history of diabetes mellitus: Secondary | ICD-10-CM

## 2016-10-19 DIAGNOSIS — I10 Essential (primary) hypertension: Secondary | ICD-10-CM | POA: Diagnosis present

## 2016-10-19 DIAGNOSIS — K7290 Hepatic failure, unspecified without coma: Secondary | ICD-10-CM | POA: Diagnosis present

## 2016-10-19 DIAGNOSIS — Z885 Allergy status to narcotic agent status: Secondary | ICD-10-CM

## 2016-10-19 DIAGNOSIS — K7460 Unspecified cirrhosis of liver: Secondary | ICD-10-CM | POA: Diagnosis present

## 2016-10-19 DIAGNOSIS — E669 Obesity, unspecified: Secondary | ICD-10-CM | POA: Diagnosis present

## 2016-10-19 DIAGNOSIS — R404 Transient alteration of awareness: Secondary | ICD-10-CM | POA: Diagnosis not present

## 2016-10-19 DIAGNOSIS — R6520 Severe sepsis without septic shock: Secondary | ICD-10-CM | POA: Diagnosis present

## 2016-10-19 DIAGNOSIS — Z888 Allergy status to other drugs, medicaments and biological substances status: Secondary | ICD-10-CM

## 2016-10-19 DIAGNOSIS — Z794 Long term (current) use of insulin: Secondary | ICD-10-CM

## 2016-10-19 DIAGNOSIS — K2950 Unspecified chronic gastritis without bleeding: Secondary | ICD-10-CM | POA: Diagnosis present

## 2016-10-19 DIAGNOSIS — Z8249 Family history of ischemic heart disease and other diseases of the circulatory system: Secondary | ICD-10-CM

## 2016-10-19 DIAGNOSIS — K7682 Hepatic encephalopathy: Secondary | ICD-10-CM | POA: Diagnosis present

## 2016-10-19 DIAGNOSIS — Z7951 Long term (current) use of inhaled steroids: Secondary | ICD-10-CM

## 2016-10-19 DIAGNOSIS — Z7982 Long term (current) use of aspirin: Secondary | ICD-10-CM

## 2016-10-19 DIAGNOSIS — M150 Primary generalized (osteo)arthritis: Secondary | ICD-10-CM | POA: Diagnosis not present

## 2016-10-19 LAB — URINALYSIS, ROUTINE W REFLEX MICROSCOPIC
Bilirubin Urine: NEGATIVE
Glucose, UA: NEGATIVE mg/dL
KETONES UR: NEGATIVE mg/dL
Nitrite: POSITIVE — AB
PH: 5 (ref 5.0–8.0)
Protein, ur: 30 mg/dL — AB
Specific Gravity, Urine: 1.021 (ref 1.005–1.030)

## 2016-10-19 LAB — LACTIC ACID, PLASMA
LACTIC ACID, VENOUS: 4 mmol/L — AB (ref 0.5–1.9)
Lactic Acid, Venous: 3.7 mmol/L (ref 0.5–1.9)
Lactic Acid, Venous: 3.8 mmol/L (ref 0.5–1.9)

## 2016-10-19 LAB — CBC WITH DIFFERENTIAL/PLATELET
BASOS ABS: 0 10*3/uL (ref 0.0–0.1)
Basophils Relative: 0 %
Eosinophils Absolute: 0 10*3/uL (ref 0.0–0.7)
Eosinophils Relative: 0 %
HEMATOCRIT: 34.9 % — AB (ref 36.0–46.0)
HEMOGLOBIN: 11.3 g/dL — AB (ref 12.0–15.0)
LYMPHS ABS: 0.4 10*3/uL — AB (ref 0.7–4.0)
LYMPHS PCT: 2 %
MCH: 26.2 pg (ref 26.0–34.0)
MCHC: 32.4 g/dL (ref 30.0–36.0)
MCV: 81 fL (ref 78.0–100.0)
Monocytes Absolute: 1.3 10*3/uL — ABNORMAL HIGH (ref 0.1–1.0)
Monocytes Relative: 7 %
NEUTROS ABS: 17.7 10*3/uL — AB (ref 1.7–7.7)
Neutrophils Relative %: 91 %
PLATELETS: 118 10*3/uL — AB (ref 150–400)
RBC: 4.31 MIL/uL (ref 3.87–5.11)
RDW: 14.6 % (ref 11.5–15.5)
WBC: 19.3 10*3/uL — AB (ref 4.0–10.5)

## 2016-10-19 LAB — GLUCOSE, CAPILLARY: GLUCOSE-CAPILLARY: 212 mg/dL — AB (ref 65–99)

## 2016-10-19 LAB — COMPREHENSIVE METABOLIC PANEL
ALBUMIN: 2.8 g/dL — AB (ref 3.5–5.0)
ALT: 12 U/L — ABNORMAL LOW (ref 14–54)
ANION GAP: 9 (ref 5–15)
AST: 23 U/L (ref 15–41)
Alkaline Phosphatase: 71 U/L (ref 38–126)
BILIRUBIN TOTAL: 2.1 mg/dL — AB (ref 0.3–1.2)
BUN: 26 mg/dL — ABNORMAL HIGH (ref 6–20)
CHLORIDE: 101 mmol/L (ref 101–111)
CO2: 19 mmol/L — ABNORMAL LOW (ref 22–32)
Calcium: 7.8 mg/dL — ABNORMAL LOW (ref 8.9–10.3)
Creatinine, Ser: 0.94 mg/dL (ref 0.44–1.00)
GFR calc Af Amer: >60 mL/min (ref >60)
GFR, EST NON AFRICAN AMERICAN: 59 mL/min — AB (ref >60)
Glucose, Bld: 224 mg/dL — ABNORMAL HIGH (ref 65–99)
POTASSIUM: 3.4 mmol/L — AB (ref 3.5–5.1)
Sodium: 129 mmol/L — ABNORMAL LOW (ref 135–145)
TOTAL PROTEIN: 5.3 g/dL — AB (ref 6.5–8.1)

## 2016-10-19 LAB — TROPONIN I

## 2016-10-19 LAB — MRSA PCR SCREENING: MRSA by PCR: NEGATIVE

## 2016-10-19 MED ORDER — SODIUM CHLORIDE 0.9 % IV BOLUS (SEPSIS)
500.0000 mL | Freq: Once | INTRAVENOUS | Status: AC
  Administered 2016-10-19: 500 mL via INTRAVENOUS

## 2016-10-19 MED ORDER — INSULIN GLARGINE 100 UNIT/ML ~~LOC~~ SOLN
50.0000 [IU] | Freq: Every day | SUBCUTANEOUS | Status: DC
  Filled 2016-10-19 (×3): qty 0.5

## 2016-10-19 MED ORDER — SODIUM CHLORIDE 0.9 % IV BOLUS (SEPSIS)
1000.0000 mL | Freq: Once | INTRAVENOUS | Status: AC
  Administered 2016-10-20: 1000 mL via INTRAVENOUS

## 2016-10-19 MED ORDER — ENOXAPARIN SODIUM 40 MG/0.4ML ~~LOC~~ SOLN
40.0000 mg | SUBCUTANEOUS | Status: DC
  Administered 2016-10-19 – 2016-10-22 (×4): 40 mg via SUBCUTANEOUS
  Filled 2016-10-19 (×4): qty 0.4

## 2016-10-19 MED ORDER — PANTOPRAZOLE SODIUM 40 MG PO TBEC
40.0000 mg | DELAYED_RELEASE_TABLET | Freq: Every day | ORAL | Status: DC
  Administered 2016-10-20 – 2016-10-23 (×4): 40 mg via ORAL
  Filled 2016-10-19 (×4): qty 1

## 2016-10-19 MED ORDER — SODIUM CHLORIDE 0.9 % IV BOLUS (SEPSIS)
1000.0000 mL | Freq: Once | INTRAVENOUS | Status: AC
  Administered 2016-10-19: 1000 mL via INTRAVENOUS

## 2016-10-19 MED ORDER — ACETAMINOPHEN 500 MG PO TABS
ORAL_TABLET | ORAL | Status: AC
  Administered 2016-10-19: 1000 mg
  Filled 2016-10-19: qty 2

## 2016-10-19 MED ORDER — FUROSEMIDE 20 MG PO TABS
20.0000 mg | ORAL_TABLET | Freq: Every day | ORAL | Status: DC | PRN
  Administered 2016-10-22: 20 mg via ORAL
  Filled 2016-10-19: qty 1

## 2016-10-19 MED ORDER — VANCOMYCIN HCL 10 G IV SOLR
1250.0000 mg | INTRAVENOUS | Status: DC
  Filled 2016-10-19 (×3): qty 1250

## 2016-10-19 MED ORDER — INSULIN ASPART 100 UNIT/ML ~~LOC~~ SOLN: 10.0000 [IU] | Freq: Three times a day (TID) | SUBCUTANEOUS | Status: DC

## 2016-10-19 MED ORDER — DEXTROSE 5 % IV SOLN
2.0000 g | INTRAVENOUS | Status: DC
  Filled 2016-10-19 (×3): qty 2

## 2016-10-19 MED ORDER — URSODIOL 300 MG PO CAPS
600.0000 mg | ORAL_CAPSULE | Freq: Two times a day (BID) | ORAL | Status: DC
Start: 2016-10-19 — End: 2016-10-23
  Administered 2016-10-20 – 2016-10-23 (×7): 600 mg via ORAL
  Filled 2016-10-19 (×10): qty 2

## 2016-10-19 MED ORDER — ALPRAZOLAM 0.5 MG PO TABS
0.5000 mg | ORAL_TABLET | Freq: Two times a day (BID) | ORAL | Status: DC
  Administered 2016-10-20 – 2016-10-23 (×7): 0.5 mg via ORAL
  Filled 2016-10-19 (×7): qty 1

## 2016-10-19 MED ORDER — ASPIRIN EC 81 MG PO TBEC
81.0000 mg | DELAYED_RELEASE_TABLET | Freq: Every day | ORAL | Status: DC
  Administered 2016-10-20 – 2016-10-23 (×4): 81 mg via ORAL
  Filled 2016-10-19 (×4): qty 1

## 2016-10-19 MED ORDER — CHLORHEXIDINE GLUCONATE 0.12 % MT SOLN
15.0000 mL | Freq: Two times a day (BID) | OROMUCOSAL | Status: DC
  Administered 2016-10-19 – 2016-10-23 (×8): 15 mL via OROMUCOSAL
  Filled 2016-10-19 (×8): qty 15

## 2016-10-19 MED ORDER — DEXTROSE 5 % IV SOLN
2.0000 g | INTRAVENOUS | Status: DC
  Administered 2016-10-20 – 2016-10-22 (×3): 2 g via INTRAVENOUS
  Filled 2016-10-19 (×4): qty 2

## 2016-10-19 MED ORDER — RIFAXIMIN 550 MG PO TABS
550.0000 mg | ORAL_TABLET | Freq: Two times a day (BID) | ORAL | Status: DC
Start: 2016-10-19 — End: 2016-10-23
  Administered 2016-10-19 – 2016-10-23 (×8): 550 mg via ORAL
  Filled 2016-10-19 (×8): qty 1

## 2016-10-19 MED ORDER — POLYETHYLENE GLYCOL 3350 17 G PO PACK: 17.0000 g | PACK | Freq: Every day | ORAL | Status: DC | PRN

## 2016-10-19 MED ORDER — SPIRONOLACTONE 25 MG PO TABS
50.0000 mg | ORAL_TABLET | Freq: Every day | ORAL | Status: DC
  Administered 2016-10-20 – 2016-10-23 (×4): 50 mg via ORAL
  Filled 2016-10-19 (×4): qty 2

## 2016-10-19 MED ORDER — ONDANSETRON HCL 4 MG/2ML IJ SOLN: 4.0000 mg | Freq: Four times a day (QID) | INTRAMUSCULAR | Status: DC | PRN

## 2016-10-19 MED ORDER — VANCOMYCIN HCL IN DEXTROSE 1-5 GM/200ML-% IV SOLN
1000.0000 mg | Freq: Once | INTRAVENOUS | Status: AC
  Administered 2016-10-19: 1000 mg via INTRAVENOUS
  Filled 2016-10-19: qty 200

## 2016-10-19 MED ORDER — POTASSIUM CHLORIDE IN NACL 40-0.9 MEQ/L-% IV SOLN
INTRAVENOUS | Status: DC
  Administered 2016-10-19 – 2016-10-20 (×3): 75 mL/h via INTRAVENOUS

## 2016-10-19 MED ORDER — MOMETASONE FURO-FORMOTEROL FUM 200-5 MCG/ACT IN AERO
INHALATION_SPRAY | RESPIRATORY_TRACT | Status: AC
Start: 2016-10-19 — End: 2016-10-19
  Filled 2016-10-19: qty 8.8

## 2016-10-19 MED ORDER — PIPERACILLIN-TAZOBACTAM 3.375 G IVPB 30 MIN
3.3750 g | Freq: Once | INTRAVENOUS | Status: AC
  Administered 2016-10-19: 3.375 g via INTRAVENOUS
  Filled 2016-10-19: qty 50

## 2016-10-19 MED ORDER — LACTULOSE 10 GM/15ML PO SOLN
20.0000 g | Freq: Three times a day (TID) | ORAL | Status: DC
  Administered 2016-10-19 – 2016-10-23 (×11): 20 g via ORAL
  Filled 2016-10-19 (×11): qty 30

## 2016-10-19 MED ORDER — INSULIN ASPART 100 UNIT/ML ~~LOC~~ SOLN
0.0000 [IU] | Freq: Three times a day (TID) | SUBCUTANEOUS | Status: DC
Start: 2016-10-20 — End: 2016-10-19

## 2016-10-19 MED ORDER — ACETAMINOPHEN 325 MG PO TABS
650.0000 mg | ORAL_TABLET | Freq: Four times a day (QID) | ORAL | Status: DC | PRN
  Administered 2016-10-20 – 2016-10-23 (×4): 650 mg via ORAL
  Filled 2016-10-19 (×4): qty 2

## 2016-10-19 MED ORDER — PIPERACILLIN-TAZOBACTAM 3.375 G IVPB: 3.3750 g | Freq: Three times a day (TID) | INTRAVENOUS | Status: DC

## 2016-10-19 MED ORDER — ORAL CARE MOUTH RINSE
15.0000 mL | Freq: Two times a day (BID) | OROMUCOSAL | Status: DC
  Administered 2016-10-20 – 2016-10-23 (×6): 15 mL via OROMUCOSAL

## 2016-10-19 MED ORDER — OXYCODONE HCL 5 MG PO TABS
5.0000 mg | ORAL_TABLET | Freq: Two times a day (BID) | ORAL | Status: DC | PRN
  Administered 2016-10-20 – 2016-10-22 (×2): 5 mg via ORAL
  Filled 2016-10-19 (×2): qty 1

## 2016-10-19 MED ORDER — INSULIN GLARGINE 100 UNIT/ML ~~LOC~~ SOLN
25.0000 [IU] | Freq: Every day | SUBCUTANEOUS | Status: DC
  Administered 2016-10-19 – 2016-10-22 (×4): 25 [IU] via SUBCUTANEOUS
  Filled 2016-10-19 (×4): qty 0.25

## 2016-10-19 MED ORDER — INSULIN ASPART 100 UNIT/ML ~~LOC~~ SOLN
0.0000 [IU] | SUBCUTANEOUS | Status: DC
  Administered 2016-10-19 – 2016-10-20 (×2): 5 [IU] via SUBCUTANEOUS
  Administered 2016-10-20: 8 [IU] via SUBCUTANEOUS
  Administered 2016-10-20: 5 [IU] via SUBCUTANEOUS
  Administered 2016-10-20: 3 [IU] via SUBCUTANEOUS
  Administered 2016-10-20: 8 [IU] via SUBCUTANEOUS
  Administered 2016-10-20 – 2016-10-21 (×2): 5 [IU] via SUBCUTANEOUS
  Administered 2016-10-21 (×2): 8 [IU] via SUBCUTANEOUS
  Administered 2016-10-21: 3 [IU] via SUBCUTANEOUS
  Administered 2016-10-21: 5 [IU] via SUBCUTANEOUS
  Administered 2016-10-21: 8 [IU] via SUBCUTANEOUS
  Administered 2016-10-22: 3 [IU] via SUBCUTANEOUS
  Administered 2016-10-22: 5 [IU] via SUBCUTANEOUS
  Administered 2016-10-22: 3 [IU] via SUBCUTANEOUS
  Administered 2016-10-22: 5 [IU] via SUBCUTANEOUS
  Administered 2016-10-22: 2 [IU] via SUBCUTANEOUS

## 2016-10-19 MED ORDER — MECLIZINE HCL 12.5 MG PO TABS: 25.0000 mg | ORAL_TABLET | Freq: Three times a day (TID) | ORAL | Status: DC | PRN

## 2016-10-19 MED ORDER — VANCOMYCIN HCL IN DEXTROSE 1-5 GM/200ML-% IV SOLN
1000.0000 mg | Freq: Once | INTRAVENOUS | Status: AC
  Administered 2016-10-19: 1000 mg via INTRAVENOUS

## 2016-10-19 MED ORDER — INSULIN DETEMIR 100 UNIT/ML FLEXPEN: 50.0000 [IU] | PEN_INJECTOR | Freq: Every day | SUBCUTANEOUS | Status: DC

## 2016-10-19 MED ORDER — PREGABALIN 50 MG PO CAPS
100.0000 mg | ORAL_CAPSULE | Freq: Two times a day (BID) | ORAL | Status: DC
  Administered 2016-10-19 – 2016-10-23 (×8): 100 mg via ORAL
  Filled 2016-10-19: qty 2
  Filled 2016-10-19: qty 1
  Filled 2016-10-19 (×2): qty 2
  Filled 2016-10-19: qty 1
  Filled 2016-10-19: qty 2
  Filled 2016-10-19 (×2): qty 1

## 2016-10-19 MED ORDER — ACETAMINOPHEN 650 MG RE SUPP: 650.0000 mg | Freq: Four times a day (QID) | RECTAL | Status: DC | PRN

## 2016-10-19 MED ORDER — VANCOMYCIN HCL IN DEXTROSE 1-5 GM/200ML-% IV SOLN
INTRAVENOUS | Status: AC
  Filled 2016-10-19: qty 200

## 2016-10-19 MED ORDER — INSULIN ASPART 100 UNIT/ML ~~LOC~~ SOLN: 0.0000 [IU] | Freq: Every day | SUBCUTANEOUS | Status: DC

## 2016-10-19 MED ORDER — ONDANSETRON HCL 4 MG PO TABS: 4.0000 mg | ORAL_TABLET | Freq: Four times a day (QID) | ORAL | Status: DC | PRN

## 2016-10-19 MED ORDER — DEXTROSE 5 % IV SOLN
1.0000 g | Freq: Once | INTRAVENOUS | Status: AC
  Administered 2016-10-19: 1 g via INTRAVENOUS
  Filled 2016-10-19: qty 10

## 2016-10-19 MED ORDER — CEFTRIAXONE SODIUM 1 G IJ SOLR
1.0000 g | Freq: Once | INTRAMUSCULAR | Status: AC
  Administered 2016-10-19: 1 g via INTRAVENOUS
  Filled 2016-10-19: qty 10

## 2016-10-19 MED ORDER — MOMETASONE FURO-FORMOTEROL FUM 200-5 MCG/ACT IN AERO
2.0000 | INHALATION_SPRAY | Freq: Two times a day (BID) | RESPIRATORY_TRACT | Status: DC
  Administered 2016-10-19 – 2016-10-23 (×8): 2 via RESPIRATORY_TRACT
  Filled 2016-10-19: qty 8.8

## 2016-10-19 MED ORDER — FLUCONAZOLE 100 MG PO TABS
150.0000 mg | ORAL_TABLET | ORAL | Status: DC
  Administered 2016-10-20 – 2016-10-23 (×2): 150 mg via ORAL
  Filled 2016-10-19 (×2): qty 2

## 2016-10-19 NOTE — Progress Notes (Signed)
Pharmacy Antibiotic Note  [name] [name] is a 75 y.o. female admitted on 10/19/2016 with sepsis.  Pharmacy has been consulted for vancomycin and zosyn dosing. She had recent admission for UTI. Vancomycin 1 gm and zosyn 3.375 gm ordered in the ED  Plan: Give additional 1 gm vanc to equal 2 gm loading dose then 1250 mg IV q24 hours Zosyn 3.375 gm IV q8 hours F/u renal function, cultures and clinical course Vanc trough when appropriate  Height: 5\' 8"  (172.7 cm) Weight: 248 lb (112.5 kg) IBW/kg (Calculated) : 63.9  Temp (24hrs), Avg:103.1 F (39.5 C), Min:103.1 F (39.5 C), Max:103.1 F (39.5 C)  No results for input(s): WBC, CREATININE, LATICACIDVEN, VANCOTROUGH, VANCOPEAK, VANCORANDOM, GENTTROUGH, GENTPEAK, GENTRANDOM, TOBRATROUGH, TOBRAPEAK, TOBRARND, AMIKACINPEAK, AMIKACINTROU, AMIKACIN in the last 168 hours.  Estimated Creatinine Clearance: 82.4 mL/min (by C-G formula based on SCr of 0.67 mg/dL).    Allergies  Allergen Reactions  . Morphine And Related Itching  . Compazine [Prochlorperazine Edisylate] Anxiety    Causes anxiety & nervousness    Antimicrobials this admission: vanc 12/8 >>  zosyn 12/8 >>   Thank you for allowing pharmacy to be a part of this patient's care.  Excell Seltzer Poteet 10/19/2016 1:46 PM

## 2016-10-19 NOTE — ED Provider Notes (Signed)
[facility] DEPT Provider Note   CSN: [id number] Arrival date & time: 10/19/16  1315  By signing my name below, I, Reola Mosher, attest that this documentation has been prepared under the direction and in the presence of Carmin Muskrat, MD. Electronically Signed: Reola Mosher, ED Scribe. 10/19/16. 1:42 PM.  History   Chief Complaint Chief Complaint  Patient presents with  . Code Sepsis   LEVEL 5 CAVEAT: HPI and ROS limited due to acuity of condition   The history is provided by the patient. No language interpreter was used.    HPI Comments: [name] [name] is a 75 y.o. female who presents to the Emergency Department complaining of gradually worsening, constant generalized weakness over the past few days. Per prior chart review, pt was seen and subsequently admitted to the hospital on 09/28/16 and d/c'd two days later on 09/30/16. At that time she was being treated for several issues, most notable UTI secondary to E. Coli causing generalized weakness.  Past Medical History:  Diagnosis Date  . Abnormal EKG    hx of left anterior fasicular block on 04-09-13 ekg epic  . Allergic rhinitis, cause unspecified   . Bilateral leg weakness   . CAD in native artery   . Chronic gastritis 03/09/11   egd by Dr. Gala Romney  . Chronic shoulder pain   . Cirrhosis ([place])   . Cirrhosis of liver (HCC)    NASH, afp on 06/17/12 =3.7, per pt she had hep B vaccines in 1996, hep A in process.   . Colon adenomas 03/08/10   tcs by Dr. Gala Romney  . Diverticula of colon 03/08/10   L side  . Esophagitis, erosive 03/08/10  . GERD (gastroesophageal reflux disease)   . History of hemorrhoids 03/08/10   tcs- internal and external  . Hyperplastic polyps of stomach 03/08/10   tcs by Dr. Dudley Major  . Intrinsic asthma, unspecified   . Obesity, unspecified   . Other and unspecified hyperlipidemia   . Pancreatitis   . Pancytopenia, acquired ([place]) 04/12/2014  . Personal history of neurosis   . Thrombocytopenia  ([place]) 04/12/2014  . Thrombocytopenia, unspecified   . Type II or unspecified type diabetes mellitus without mention of complication, not stated as uncontrolled   . Unspecified essential hypertension   . Unspecified fall   . Unspecified hereditary and idiopathic peripheral neuropathy   . Vertigo   . Wears glasses    Patient Active Problem List   Diagnosis Date Noted  . Leg edema, left 09/28/2016  . Encephalopathy 08/03/2016  . Hepatic encephalopathy ([place])   . Leukopenia 08/02/2016  . 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 UTI (lower urinary tract infection)   . Fall 12/24/2014  . Encounter for examination for admission to nursing home 12/16/2014  . Hyperammonemia ([place]) 12/16/2014  . Weakness 12/15/2014  . Physical deconditioning 08/18/2014  . Morbid obesity ([place]) 08/18/2014  . Elevated LFTs 07/29/2014  . Left-sided weakness 07/12/2014  . Pancreatitis, acute 04/12/2014  . Pancytopenia, acquired ([place]) 04/12/2014  . Poor balance 10/14/2013  . Acute encephalopathy 07/30/2013  . Encephalopathy, hepatic ([place]) 07/30/2013  . Confusion 07/29/2013  . Edema of left lower extremity 04/14/2013  . Generalized weakness 02/19/2013  . Urinary tract infection without hematuria 02/14/2013  . Fracture of humeral shaft, right, closed 02/11/2013  . Multiple falls 01/26/2013  . Difficulty walking 01/26/2013  . Dizziness 09/02/2012  . Frequent falls 08/08/2012  . Ataxia 08/08/2012  . Peripheral neuropathy ([place]) 08/08/2012  . 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 Normocytic anemia 04/08/2012  . Posterior tibial tendon dysfunction 07/12/2011  . Hyperlipidemia 05/31/2011  . Essential hypertension 05/31/2011  . GERD 09/29/2010  . Liver cirrhosis secondary to NASH ([place]) 09/29/2010  . History of colonic polyps 02/13/2010  . Thrombocytopenia ([place]) 01/04/2010  . Hereditary and idiopathic peripheral neuropathy 01/04/2010  . CAD, NATIVE VESSEL 01/04/2010  .  ANXIETY DISORDER, HX OF 01/04/2010  . Intrinsic asthma, unspecified 03/16/2009  . ALLERGIC RHINITIS 02/18/2009    Past Surgical History:  Procedure Laterality Date  . ABDOMINAL HYSTERECTOMY    . BREAST SURGERY Right few yrs ago   benign area removed  . CARDIAC CATHETERIZATION  02/25/2007   Dr. [name]:  normal LV systolic function, mild irregularities of LAD  . COLONOSCOPY  03/08/10   Dr. [name]-->ext/int hemorrhoids, anal paipilla, rectal polyps, desc polyps, cecal polyp, left-sided diverticula. suboptiomal prep. next TCS 02/2015. multiple adenomas  . ESOPHAGOGASTRODUODENOSCOPY  03/08/10   Dr. [name] erosive RE, small hh, antral erosions, small 1cm are of mucosal indentation along gastric body of doubtful significance, cystic nodularity of hypophyarynx, base of tongue, base of epiglottis. benign gastric biopsies  . ESOPHAGOGASTRODUODENOSCOPY  10/08/2011   Dr. [name] esophageal varices, antral erosion. next egd 09/2013  . ESOPHAGOGASTRODUODENOSCOPY N/A 02/17/2014   Dr. Gala Romney: portal gastropathy, no varices. Due for surveillance 2017  . EYE SURGERY     cataracts bilateral  . FOOT SURGERY  2003   lt foot  . hysterectomy and btl     s/p  . ORIF HUMERUS FRACTURE Right 02/17/2013   Procedure: OPEN REDUCTION INTERNAL FIXATION (ORIF) RIGHT PROXIMAL HUMERUS FRACTURE;  Surgeon: [name], MD;  Location: [facility];  Service: Orthopedics;  Laterality: Right;  . PARTIAL GASTRECTOMY     family denies  . TUBAL LIGATION    . TUMOR EXCISION  2003   rt arm and left foot    OB History    No data available       Home Medications    Prior to Admission medications   Medication Sig Start Date End Date Taking? Authorizing Provider  ALPRAZolam Duanne Moron) 0.5 MG tablet Take 1 tablet (0.5 mg total) by mouth 2 (two) times daily. 09/30/16   Rexene Alberts, MD  aspirin EC 81 MG tablet Take 81 mg by mouth daily.    Historical Provider, MD  budesonide-formoterol (SYMBICORT) 160-4.5 MCG/ACT inhaler Inhale  2 puffs into the lungs at bedtime. 08/06/16   Kathie Dike, MD  cefUROXime (CEFTIN) 250 MG tablet Take 1 tablet (250 mg total) by mouth 2 (two) times daily with a meal. Start taking antibiotic on 10/01/16 and take until completed. 09/30/16   Rexene Alberts, MD  diphenhydrAMINE (BENADRYL) 25 MG tablet Take 25 mg by mouth every 6 (six) hours as needed.    Historical Provider, MD  enalapril (VASOTEC) 5 MG tablet Take 1 tablet by mouth daily. 08/25/14   Historical Provider, MD  furosemide (LASIX) 20 MG tablet Take 1 tablet (20 mg total) by mouth daily as needed for fluid. 08/06/16   Kathie Dike, MD  HUMALOG KWIKPEN 100 UNIT/ML KiwkPen INJECT 10-16 UNITS INTO THE SKIN THREE TIMES DAILY 07/12/16   Cassandria Anger, MD  HUMALOG KWIKPEN 100 UNIT/ML KiwkPen INJECT 10-16 UNITS INTO THE SKIN THREE TIMES DAILY 10/08/16   Cassandria Anger, MD  lactulose (CHRONULAC) 10 GM/15ML solution Take 20 g by mouth 3 (three) times daily.  10/20/14   Historical Provider, MD  [facility] Mall[facility]  100 UNIT/ML Pen INJECT 50 UNITS INTO THE SKIN DAILY AT 10PM 10/01/16   Cassandria Anger, MD  NEXIUM 40 MG capsule Take 40 mg by mouth every morning.  06/17/14   Historical Provider, MD  ONETOUCH VERIO test strip USE UP TO FOUR TIMES DAILY AS DIRECTED 07/02/16   Cassandria Anger, MD  oxyCODONE (OXY IR/ROXICODONE) 5 MG immediate release tablet Take 5 mg by mouth 2 (two) times daily as needed for moderate pain or severe pain.  03/16/16   Historical Provider, MD  pregabalin (LYRICA) 100 MG capsule Take 100 mg by mouth 2 (two) times daily.    Historical Provider, MD  rifaximin (XIFAXAN) 550 MG TABS tablet Take 1 tablet (550 mg total) by mouth 2 (two) times daily. 06/28/14   Kathie Dike, MD  spironolactone (ALDACTONE) 50 MG tablet TAKE ONE TABLET BY MOUTH ONCE DAILY IN THE MORNING 03/19/16   Mahala Menghini, PA-C  triamcinolone cream (KENALOG) 0.1 % Apply 1 application topically daily as needed (for irritation).  06/26/13   Historical  Provider, MD  ursodiol (URSO FORTE) 500 MG tablet Take 500 mg by mouth 2 (two) times daily.    Historical Provider, MD   Family History Family History  Problem Relation Age of Onset  . Coronary artery disease      FH  . Diabetes      FH  . Heart disease Mother   . Colon cancer Neg Hx    Social History Social History  Substance Use Topics  . Smoking status: Never Smoker  . Smokeless tobacco: Never Used  . Alcohol use No   Allergies   Morphine and related and Compazine [prochlorperazine edisylate]  Review of Systems Review of Systems  Unable to perform ROS: Acuity of condition   Physical Exam Updated Vital Signs BP (!) 96/51 (BP Location: Right Arm)   Pulse 99   Temp (!) 103.1 F (39.5 C) (Rectal)   Resp 18   Ht 5\' 8"  (1.727 m)   Wt 248 lb (112.5 kg)   SpO2 95%   BMI 37.71 kg/m   Physical Exam  Constitutional: She appears well-developed and well-nourished. No distress.  Listless, minimally interactive obese elderly female speaking briefly, barely audibly.   HENT:  Head: Normocephalic and atraumatic.  Eyes: Conjunctivae are normal.  Neck: Normal range of motion.  Cardiovascular: Regular rhythm and normal heart sounds.   No murmur heard. Tachycardia  Pulmonary/Chest: Effort normal.  Abdominal: She exhibits no distension.  Obese, large abdomen, nontender  Musculoskeletal: She exhibits deformity.  RUE deformity is noted.  Neurological: She is alert.  Skin: No pallor.  Psychiatric:  Withdrawn, minimally interactive  Nursing note and vitals reviewed.  ED Treatments / Results  DIAGNOSTIC STUDIES: Oxygen Saturation is 95% on RA, adequate by my interpretation.   Labs (all labs ordered are listed, but only abnormal results are displayed) Labs Reviewed  COMPREHENSIVE METABOLIC PANEL - Abnormal; Notable for the following:       Result Value   Sodium 129 (*)    Potassium 3.4 (*)    CO2 19 (*)    Glucose, Bld 224 (*)    BUN 26 (*)    Calcium 7.8 (*)     Total Protein 5.3 (*)    Albumin 2.8 (*)    ALT 12 (*)    Total Bilirubin 2.1 (*)    GFR calc non Af Amer 59 (*)    All other components within normal limits  CBC WITH DIFFERENTIAL/PLATELET -  Abnormal; Notable for the following:    WBC 19.3 (*)    Hemoglobin 11.3 (*)    HCT 34.9 (*)    Platelets 118 (*)    Neutro Abs 17.7 (*)    Lymphs Abs 0.4 (*)    Monocytes Absolute 1.3 (*)    All other components within normal limits  URINALYSIS, ROUTINE W REFLEX MICROSCOPIC - Abnormal; Notable for the following:    Color, Urine AMBER (*)    APPearance CLOUDY (*)    Hgb urine dipstick MODERATE (*)    Protein, ur 30 (*)    Nitrite POSITIVE (*)    Leukocytes, UA TRACE (*)    Bacteria, UA MANY (*)    All other components within normal limits  LACTIC ACID, PLASMA - Abnormal; Notable for the following:    Lactic Acid, Venous 3.7 (*)    All other components within normal limits  LACTIC ACID, PLASMA - Abnormal; Notable for the following:    Lactic Acid, Venous 4.0 (*)    All other components within normal limits  GLUCOSE, CAPILLARY - Abnormal; Notable for the following:    Glucose-Capillary 212 (*)    All other components within normal limits  CULTURE, BLOOD (ROUTINE X 2)  CULTURE, BLOOD (ROUTINE X 2)  MRSA PCR SCREENING  URINE CULTURE  TROPONIN I  BASIC METABOLIC PANEL  CBC  LACTIC ACID, PLASMA  LACTIC ACID, PLASMA   EKG  EKG Interpretation  Date/Time:  Friday October 19 2016 13:28:51 EST Ventricular Rate:  99 PR Interval:    QRS Duration: 101 QT Interval:  355 QTC Calculation: 456 R Axis:   177 Text Interpretation:  Sinus rhythm T wave abnormality Artifact No significant change since last tracing Abnormal ekg Confirmed by Carmin Muskrat  MD [phone]) on 10/19/2016 2:13:02 PM      Radiology Dg Chest Port 1 View  Result Date: 10/19/2016 CLINICAL DATA:  Fever and altered mental status EXAM: PORTABLE CHEST 1 VIEW COMPARISON:  September 28, 2016 FINDINGS: Cardiomegaly. Poor  evaluation of the left retrocardiac region on the frontal view. No interval changes or acute abnormalities are seen. IMPRESSION: No acute abnormalities seen on this limited portable view. Electronically Signed   By: Dorise Bullion III M.D   On: 10/19/2016 15:10    Procedures Procedures   Medications Ordered in ED Medications  vancomycin (VANCOCIN) 1,250 mg in sodium chloride 0.9 % 250 mL IVPB (not administered)  fluconazole (DIFLUCAN) tablet 150 mg (not administered)  meclizine (ANTIVERT) tablet 25 mg (not administered)  ALPRAZolam (XANAX) tablet 0.5 mg (0.5 mg Oral Not Given 10/19/16 2142)  mometasone-formoterol (DULERA) 200-5 MCG/ACT inhaler 2 puff (2 puffs Inhalation Given 10/19/16 2112)  oxyCODONE (Oxy IR/ROXICODONE) immediate release tablet 5 mg (not administered)  aspirin EC tablet 81 mg (not administered)  lactulose (CHRONULAC) 10 GM/15ML solution 20 g (20 g Oral Given 10/19/16 2137)  pregabalin (LYRICA) capsule 100 mg (100 mg Oral Given 10/19/16 2135)  ursodiol (ACTIGALL) capsule 600 mg (not administered)  pantoprazole (PROTONIX) EC tablet 40 mg (not administered)  rifaximin (XIFAXAN) tablet 550 mg (not administered)  acetaminophen (TYLENOL) tablet 650 mg (not administered)    Or  acetaminophen (TYLENOL) suppository 650 mg (not administered)  enoxaparin (LOVENOX) injection 40 mg (40 mg Subcutaneous Given 10/19/16 2135)  polyethylene glycol (MIRALAX / GLYCOLAX) packet 17 g (not administered)  ondansetron (ZOFRAN) tablet 4 mg (not administered)    Or  ondansetron (ZOFRAN) injection 4 mg (not administered)  0.9 % NaCl with KCl 40 mEq /  L  infusion (75 mL/hr Intravenous New Bag/Given 10/19/16 1829)  furosemide (LASIX) tablet 20 mg (not administered)  spironolactone (ALDACTONE) tablet 50 mg (not administered)  cefTRIAXone (ROCEPHIN) 2 g in dextrose 5 % 50 mL IVPB (not administered)  chlorhexidine (PERIDEX) 0.12 % solution 15 mL (15 mLs Mouth Rinse Given 10/19/16 2135)  MEDLINE mouth  rinse (not administered)  insulin aspart (novoLOG) injection 0-15 Units (5 Units Subcutaneous Given 10/19/16 2054)  insulin glargine (LANTUS) injection 25 Units (25 Units Subcutaneous Given 10/19/16 2135)  acetaminophen (TYLENOL) 500 MG tablet (1,000 mg  Given 10/19/16 1347)  sodium chloride 0.9 % bolus 1,000 mL (0 mLs Intravenous Stopped 10/19/16 1654)    And  sodium chloride 0.9 % bolus 1,000 mL (1,000 mLs Intravenous New Bag/Given 10/19/16 1550)    And  sodium chloride 0.9 % bolus 1,000 mL (1,000 mLs Intravenous New Bag/Given 10/19/16 1617)    And  sodium chloride 0.9 % bolus 500 mL (0 mLs Intravenous Stopped 10/19/16 1617)  piperacillin-tazobactam (ZOSYN) IVPB 3.375 g (0 g Intravenous Stopped 10/19/16 1445)  vancomycin (VANCOCIN) IVPB 1000 mg/200 mL premix (0 mg Intravenous Stopped 10/19/16 1655)  vancomycin (VANCOCIN) IVPB 1000 mg/200 mL premix (0 mg Intravenous Stopped 10/19/16 1515)  cefTRIAXone (ROCEPHIN) 1 g in dextrose 5 % 50 mL IVPB (1 g Intravenous Given 10/19/16 1815)  cefTRIAXone (ROCEPHIN) 1 g in dextrose 5 % 50 mL IVPB (1 g Intravenous Given 10/19/16 1958)    Initial Impression / Assessment and Plan / ED Course  I have reviewed the triage vital signs and the nursing notes.  Pertinent labs & imaging results that were available during my care of the patient were reviewed by me and considered in my medical decision making (see chart for details).  Clinical Course    After 2 L initial fluid resuscitation the patient remained hypotensive, though she was beginning to have improved mental status. Patient's voice was stronger, with more audible speech.   Patient and family members aware of all results, including concern for sepsis. Extremity from the patient was recently hospitalized for urinary tract infection with sepsis. Patient has received fluid resuscitation, broad-spectrum antibiotics, remains hypotensive, though with improved blood pressure from arrival. Given concern for sepsis,  patient reported admission to the stepdown unit.   Final Clinical Impressions(s) / ED Diagnoses  Sepsis Hyponatremia Urinary tract infection  CRITICAL CARE Performed by: Carmin Muskrat Total critical care time: 40 minutes Critical care time was exclusive of separately billable procedures and treating other patients. Critical care was necessary to treat or prevent imminent or life-threatening deterioration. Critical care was time spent personally by me on the following activities: development of treatment plan with patient and/or surrogate as well as nursing, discussions with consultants, evaluation of patient's response to treatment, examination of patient, obtaining history from patient or surrogate, ordering and performing treatments and interventions, ordering and review of laboratory studies, ordering and review of radiographic studies, pulse oximetry and re-evaluation of patient's condition.     Carmin Muskrat, MD 10/19/16 [phone]

## 2016-10-19 NOTE — ED Notes (Signed)
CRITICAL VALUE ALERT  Critical value received:  Lactic Acid 3.7  Date of notification:  10/19/16  Time of notification:  1615  Critical value read back:Yes.    Nurse who received alert:  Laurell Josephs RN  MD notified (1st page):  Vanita Panda  Time of first page:  1615  MD notified (2nd page):  Time of second page:  Responding MD:  Vanita Panda  Time MD responded:  [phone]

## 2016-10-19 NOTE — ED Notes (Signed)
Clarification on antibiotics, Vancomycin 2 grams given total.  Pharmacy unsure why third dose showing due at 1600.

## 2016-10-19 NOTE — ED Notes (Signed)
Lab unable to obtain blood at this time.  Antibiotics began prior to blood cultures.  2nd RN attempting.

## 2016-10-19 NOTE — ED Triage Notes (Signed)
EMS reports increased lethargy with fever and hypotension.  Being tx for UTI with Cipro.  Pt unable to answer any questions but will respond to name.

## 2016-10-19 NOTE — ED Notes (Signed)
Unable to obtain second IV access.  Lab in to draw blood.

## 2016-10-19 NOTE — H&P (Signed)
History and Physical  [name] [name] [id number] DOB: Jun 10, 1941 DOA: 10/19/2016  Referring physician: Dr Vanita Panda, ED physician PCP: Purvis Kilts, MD  Outpatient Specialists:   Chief Complaint: Weakness  HPI: [name] [name] is a 75 y.o. female with a history of dementia, [name] cirrhosis, coronary artery disease, GERD, obesity, type 2 diabetes, hypertension. Patient presents with 5 days of gradual decline and weakness, resulting in fever today. The patient was seen by her primary care physician 2 days ago and was diagnosed with UTI, however she was not started on antibiotics. Her PCP did instruct her to come to the emergency department as he thought she needed to be admitted, however the patient did not come. Patient had fevers today and is slightly confused per her husband. Her appetite has decreased and she has become more week. Patient was admitted back in mid November with a UTI. Her husband says that this has never fully gone away. No palliating or provoking factors.  Emergency Department Course: Urinalysis shows UTI with white count of 19.3 and a lactic acidosis of 3.7. Creatinine is 0.94. Patient is slightly hyponatremic.  Review of Systems:   Pt denies any chills, nausea, vomiting, diarrhea, constipation, abdominal pain, shortness of breath, dyspnea on exertion, orthopnea, cough, wheezing, palpitations, headache, vision changes, lightheadedness, dizziness, melena, rectal bleeding.  Review of systems are otherwise negative  Past Medical History:  Diagnosis Date  . Abnormal EKG    hx of left anterior fasicular block on 04-09-13 ekg epic  . Allergic rhinitis, cause unspecified   . Bilateral leg weakness   . CAD in native artery   . Chronic gastritis 03/09/11   egd by Dr. Gala Romney  . Chronic shoulder pain   . Cirrhosis ([place])   . Cirrhosis of liver (HCC)    NASH, afp on 06/17/12 =3.7, per pt she had hep B vaccines in 1996, hep A in process.   . Colon adenomas 03/08/10   tcs by Dr.  Gala Romney  . Diverticula of colon 03/08/10   L side  . Esophagitis, erosive 03/08/10  . GERD (gastroesophageal reflux disease)   . History of hemorrhoids 03/08/10   tcs- internal and external  . Hyperplastic polyps of stomach 03/08/10   tcs by Dr. Dudley Major  . Intrinsic asthma, unspecified   . Obesity, unspecified   . Other and unspecified hyperlipidemia   . Pancreatitis   . Pancytopenia, acquired ([place]) 04/12/2014  . Personal history of neurosis   . Thrombocytopenia ([place]) 04/12/2014  . Thrombocytopenia, unspecified   . Type II or unspecified type diabetes mellitus without mention of complication, not stated as uncontrolled   . Unspecified essential hypertension   . Unspecified fall   . Unspecified hereditary and idiopathic peripheral neuropathy   . Vertigo   . Wears glasses    Past Surgical History:  Procedure Laterality Date  . ABDOMINAL HYSTERECTOMY    . BREAST SURGERY Right few yrs ago   benign area removed  . CARDIAC CATHETERIZATION  02/25/2007   Dr. [name]:  normal LV systolic function, mild irregularities of LAD  . COLONOSCOPY  03/08/10   Dr. [name]-->ext/int hemorrhoids, anal paipilla, rectal polyps, desc polyps, cecal polyp, left-sided diverticula. suboptiomal prep. next TCS 02/2015. multiple adenomas  . ESOPHAGOGASTRODUODENOSCOPY  03/08/10   Dr. [name] erosive RE, small hh, antral erosions, small 1cm are of mucosal indentation along gastric body of doubtful significance, cystic nodularity of hypophyarynx, base of tongue, base of epiglottis. benign gastric biopsies  . ESOPHAGOGASTRODUODENOSCOPY  10/08/2011   Dr.  [name]-no esophageal varices, antral erosion. next egd 09/2013  . ESOPHAGOGASTRODUODENOSCOPY N/A 02/17/2014   Dr. Gala Romney: portal gastropathy, no varices. Due for surveillance 2017  . EYE SURGERY     cataracts bilateral  . FOOT SURGERY  2003   lt foot  . hysterectomy and btl     s/p  . ORIF HUMERUS FRACTURE Right 02/17/2013   Procedure: OPEN REDUCTION INTERNAL FIXATION (ORIF)  RIGHT PROXIMAL HUMERUS FRACTURE;  Surgeon: [name], MD;  Location: [facility];  Service: Orthopedics;  Laterality: Right;  . PARTIAL GASTRECTOMY     family denies  . TUBAL LIGATION    . TUMOR EXCISION  2003   rt arm and left foot   Social History:  reports that she has never smoked. She has never used smokeless tobacco. She reports that she does not drink alcohol or use drugs. Patient lives at home  Allergies  Allergen Reactions  . Morphine And Related Itching  . Compazine [Prochlorperazine Edisylate] Anxiety    Causes anxiety & nervousness    Family History  Problem Relation Age of Onset  . Coronary artery disease      FH  . Diabetes      FH  . Heart disease Mother   . Colon cancer Neg Hx      Prior to Admission medications   Medication Sig Start Date End Date Taking? Authorizing Provider  ALPRAZolam Duanne Moron) 0.5 MG tablet Take 1 tablet (0.5 mg total) by mouth 2 (two) times daily. 09/30/16  Yes Rexene Alberts, MD  aspirin EC 81 MG tablet Take 81 mg by mouth daily.   Yes Historical Provider, MD  budesonide-formoterol (SYMBICORT) 160-4.5 MCG/ACT inhaler Inhale 2 puffs into the lungs at bedtime. 08/06/16  Yes Kathie Dike, MD  enalapril (VASOTEC) 5 MG tablet Take 1 tablet by mouth daily. 08/25/14  Yes Historical Provider, MD  fluconazole (DIFLUCAN) 150 MG tablet Take 150 mg by mouth as directed. Every 4 days 10/17/16  Yes Historical Provider, MD  HUMALOG KWIKPEN 100 UNIT/ML KiwkPen INJECT 10-16 UNITS INTO THE SKIN THREE TIMES DAILY 10/08/16  Yes Cassandria Anger, MD  lactulose (CHRONULAC) 10 GM/15ML solution Take 20 g by mouth 3 (three) times daily.  10/20/14  Yes Historical Provider, MD  LEVEMIR FLEXTOUCH 100 UNIT/ML Pen INJECT 50 UNITS INTO THE SKIN DAILY AT 10PM 10/01/16  Yes Cassandria Anger, MD  NEXIUM 40 MG capsule Take 40 mg by mouth every morning.  06/17/14  Yes Historical Provider, MD  oxyCODONE (OXY IR/ROXICODONE) 5 MG immediate release tablet Take 5 mg by mouth 2  (two) times daily as needed for moderate pain or severe pain.  03/16/16  Yes Historical Provider, MD  pregabalin (LYRICA) 100 MG capsule Take 100 mg by mouth 2 (two) times daily.   Yes Historical Provider, MD  rifaximin (XIFAXAN) 550 MG TABS tablet Take 1 tablet (550 mg total) by mouth 2 (two) times daily. 06/28/14  Yes Kathie Dike, MD  spironolactone (ALDACTONE) 50 MG tablet TAKE ONE TABLET BY MOUTH ONCE DAILY IN THE MORNING 03/19/16  Yes Mahala Menghini, PA-C  ursodiol (URSO FORTE) 500 MG tablet Take 500 mg by mouth 2 (two) times daily.   Yes Historical Provider, MD  cefUROXime (CEFTIN) 250 MG tablet Take 1 tablet (250 mg total) by mouth 2 (two) times daily with a meal. Start taking antibiotic on 10/01/16 and take until completed. Patient not taking: Reported on 10/19/2016 09/30/16   Rexene Alberts, MD  diphenhydrAMINE (BENADRYL) 25 MG tablet Take  25 mg by mouth every 6 (six) hours as needed.    Historical Provider, MD  furosemide (LASIX) 20 MG tablet Take 1 tablet (20 mg total) by mouth daily as needed for fluid. 08/06/16   Kathie Dike, MD  HUMALOG KWIKPEN 100 UNIT/ML KiwkPen INJECT 10-16 UNITS INTO THE SKIN THREE TIMES DAILY Patient not taking: Reported on 10/19/2016 07/12/16   Cassandria Anger, MD  meclizine (ANTIVERT) 25 MG tablet Take 25 mg by mouth 3 (three) times daily as needed for dizziness.  10/17/16   Historical Provider, MD  ONETOUCH VERIO test strip USE UP TO FOUR TIMES DAILY AS DIRECTED 07/02/16   Cassandria Anger, MD  triamcinolone cream (KENALOG) 0.1 % Apply 1 application topically daily as needed (for irritation).  06/26/13   Historical Provider, MD    Sepsis - Repeat Assessment  Performed at:    [id number]  Vitals     Blood pressure 135/84, pulse 92, temperature 99.2 F (37.3 C), temperature source Oral, resp. rate 20, height 5\' 8"  (1.727 m), weight 118.7 kg (261 lb 11 oz), SpO2 98 %.  Heart:     Regular rate and rhythm  Lungs:    CTA  Capillary Refill:   <2 sec  Peripheral  Pulse:   Radial pulse palpable  Skin:     Pale    Physical Exam: BP (!) 88/50   Pulse 86   Temp (!) 103.1 F (39.5 C) (Rectal)   Resp 20   Ht 5\' 8"  (1.727 m)   Wt 112.5 kg (248 lb)   SpO2 96%   BMI 37.71 kg/m   General: Elderly Caucasian female. Awake and alert and oriented x3. No acute cardiopulmonary distress.  HEENT: Normocephalic atraumatic.  Right and left ears normal in appearance.  Pupils equal, round, reactive to light. Extraocular muscles are intact. Sclerae anicteric and noninjected.  Moist mucosal membranes. No mucosal lesions.  Neck: Neck supple without lymphadenopathy. No carotid bruits. No masses palpated.  Cardiovascular: Regular rate with normal S1-S2 sounds. No murmurs, rubs, gallops auscultated. No JVD.  Respiratory: Good respiratory effort with no wheezes, rales, rhonchi. Lungs clear to auscultation bilaterally.  No accessory muscle use. Abdomen: Morbidly obese. Soft, nontender, nondistended. Active bowel sounds. No masses or hepatosplenomegaly  Skin: No rashes, lesions, or ulcerations.  Dry, warm to touch. 2+ dorsalis pedis and radial pulses. Musculoskeletal: No calf or leg pain. All major joints not erythematous nontender.  No upper or lower joint deformation.  Good ROM.  No contractures  Psychiatric: Intact judgment and insight. Pleasant and cooperative. Neurologic: No focal neurological deficits. Strength is 5/5 and symmetric in upper and lower extremities.  Cranial nerves II through XII are grossly intact.           Labs on Admission: I have personally reviewed following labs and imaging studies  CBC:  Recent Labs Lab 10/19/16 1424  WBC 19.3*  NEUTROABS 17.7*  HGB 11.3*  HCT 34.9*  MCV 81.0  PLT 123456*   Basic Metabolic Panel:  Recent Labs Lab 10/19/16 1424  NA 129*  K 3.4*  CL 101  CO2 19*  GLUCOSE 224*  BUN 26*  CREATININE 0.94  CALCIUM 7.8*   GFR: Estimated Creatinine Clearance: 70.1 mL/min (by C-G formula based on SCr of 0.94  mg/dL). Liver Function Tests:  Recent Labs Lab 10/19/16 1424  AST 23  ALT 12*  ALKPHOS 71  BILITOT 2.1*  PROT 5.3*  ALBUMIN 2.8*   No results for input(s): LIPASE, AMYLASE in the last  168 hours. No results for input(s): AMMONIA in the last 168 hours. Coagulation Profile: No results for input(s): INR, PROTIME in the last 168 hours. Cardiac Enzymes:  Recent Labs Lab 10/19/16 1424  TROPONINI <0.03   BNP (last 3 results) No results for input(s): PROBNP in the last 8760 hours. HbA1C: No results for input(s): HGBA1C in the last 72 hours. CBG: No results for input(s): GLUCAP in the last 168 hours. Lipid Profile: No results for input(s): CHOL, HDL, LDLCALC, TRIG, CHOLHDL, LDLDIRECT in the last 72 hours. Thyroid Function Tests: No results for input(s): TSH, T4TOTAL, FREET4, T3FREE, THYROIDAB in the last 72 hours. Anemia Panel: No results for input(s): VITAMINB12, FOLATE, FERRITIN, TIBC, IRON, RETICCTPCT in the last 72 hours. Urine analysis:    Component Value Date/Time   COLORURINE AMBER (A) 10/19/2016 1332   APPEARANCEUR CLOUDY (A) 10/19/2016 1332   LABSPEC 1.021 10/19/2016 1332   PHURINE 5.0 10/19/2016 1332   GLUCOSEU NEGATIVE 10/19/2016 1332   HGBUR MODERATE (A) 10/19/2016 1332   BILIRUBINUR NEGATIVE 10/19/2016 1332   KETONESUR NEGATIVE 10/19/2016 1332   PROTEINUR 30 (A) 10/19/2016 1332   UROBILINOGEN 1.0 01/18/2015 0139   NITRITE POSITIVE (A) 10/19/2016 1332   LEUKOCYTESUR TRACE (A) 10/19/2016 1332   Sepsis Labs: @LABRCNTIP (procalcitonin:4,lacticidven:4) ) Recent Results (from the past 240 hour(s))  Blood Culture (routine x 2)     Status: None (Preliminary result)   Collection Time: 10/19/16  2:17 PM  Result Value Ref Range Status   Specimen Description BLOOD RIGHT HAND  Final   Special Requests BOTTLES DRAWN AEROBIC ONLY 4CC  Final   Culture PENDING  Incomplete   Report Status PENDING  Incomplete     Radiological Exams on Admission: Dg Chest Port 1  View  Result Date: 10/19/2016 CLINICAL DATA:  Fever and altered mental status EXAM: PORTABLE CHEST 1 VIEW COMPARISON:  September 28, 2016 FINDINGS: Cardiomegaly. Poor evaluation of the left retrocardiac region on the frontal view. No interval changes or acute abnormalities are seen. IMPRESSION: No acute abnormalities seen on this limited portable view. Electronically Signed   By: Dorise Bullion III M.D   On: 10/19/2016 15:10    EKG: Independently reviewed. Sinus rhythm. T-wave abnormality. No change from prior.  Assessment/Plan: Principal Problem:   Severe sepsis (HCC) Active Problems:   CAD, NATIVE VESSEL   GERD   Essential hypertension   Lower urinary tract infectious disease   Diabetes mellitus with neuropathy (HCC)   Lactic acidosis    This patient was discussed with the ED physician, including pertinent vitals, physical exam findings, labs, and imaging.  We also discussed care given by the ED provider.  #1 severe sepsis  Admit to stepdown  Patient resuscitated with 57mL/kg in ED  As we have a source, will stop vancomycin and Zosyn  Urine and blood cultures pending #2 UTI  Last urine culture showed intermittent resistance to Zosyn  Start ceftriaxone 2 g every 24 hours #3 lactic acidosis  Continue lactic acid levels #4 essential hypertension  Hold antihypertensives #5 diabetes  Continue levemir  Premeal with SSI  CBGs before meals and daily at bedtime #6 coronary artery disease   DVT prophylaxis: Lovenox Consultants: None Code Status: DO NOT INTUBATE otherwise full code Family Communication: Husband in the room  Disposition Plan: Pending   Truett Mainland, DO [facility] Pager [phone]  If 7PM-7AM, please contact night-coverage [email or web address] Password [email or web address]

## 2016-10-20 LAB — BASIC METABOLIC PANEL
Anion gap: 5 (ref 5–15)
BUN: 34 mg/dL — AB (ref 6–20)
CHLORIDE: 109 mmol/L (ref 101–111)
CO2: 20 mmol/L — ABNORMAL LOW (ref 22–32)
Calcium: 7.4 mg/dL — ABNORMAL LOW (ref 8.9–10.3)
Creatinine, Ser: 1.02 mg/dL — ABNORMAL HIGH (ref 0.44–1.00)
GFR calc Af Amer: >60 mL/min (ref >60)
GFR, EST NON AFRICAN AMERICAN: 53 mL/min — AB (ref >60)
GLUCOSE: 211 mg/dL — AB (ref 65–99)
POTASSIUM: 4.2 mmol/L (ref 3.5–5.1)
Sodium: 134 mmol/L — ABNORMAL LOW (ref 135–145)

## 2016-10-20 LAB — GLUCOSE, CAPILLARY
GLUCOSE-CAPILLARY: 188 mg/dL — AB (ref 65–99)
GLUCOSE-CAPILLARY: 255 mg/dL — AB (ref 65–99)
GLUCOSE-CAPILLARY: 258 mg/dL — AB (ref 65–99)
Glucose-Capillary: 208 mg/dL — ABNORMAL HIGH (ref 65–99)
Glucose-Capillary: 216 mg/dL — ABNORMAL HIGH (ref 65–99)
Glucose-Capillary: 218 mg/dL — ABNORMAL HIGH (ref 65–99)

## 2016-10-20 LAB — CBC
HEMATOCRIT: 28.5 % — AB (ref 36.0–46.0)
Hemoglobin: 8.5 g/dL — ABNORMAL LOW (ref 12.0–15.0)
MCH: 24.3 pg — AB (ref 26.0–34.0)
MCHC: 29.8 g/dL — AB (ref 30.0–36.0)
MCV: 81.4 fL (ref 78.0–100.0)
Platelets: 73 10*3/uL — ABNORMAL LOW (ref 150–400)
RBC: 3.5 MIL/uL — ABNORMAL LOW (ref 3.87–5.11)
RDW: 15.1 % (ref 11.5–15.5)
WBC: 5.2 10*3/uL (ref 4.0–10.5)

## 2016-10-20 LAB — AMMONIA: Ammonia: 59 umol/L — ABNORMAL HIGH (ref 9–35)

## 2016-10-20 LAB — LACTIC ACID, PLASMA: Lactic Acid, Venous: 2.5 mmol/L (ref 0.5–1.9)

## 2016-10-20 NOTE — Progress Notes (Signed)
PROGRESS NOTE  [name] [name] [id number] DOB: [birth year] DOA: 10/19/2016 PCP: Purvis Kilts, MD   LOS: 1 day   Brief Narrative: [name] [name] is a 75 y.o. female with a history of dementia, [name] cirrhosis, coronary artery disease, GERD, obesity, type 2 diabetes, hypertension. Patient presents with 5 days of gradual decline and weakness, resulting in fever today. The patient was seen by her primary care physician 2 days ago and was diagnosed with UTI, however she was not started on antibiotics. Her PCP did instruct her to come to the emergency department as he thought she needed to be admitted, however the patient did not come. Patient had fevers today and is slightly confused per her husband. Her appetite has decreased and she has become more week. Patient was admitted back in mid November with a UTI. Her husband says that this has never fully gone away. No palliating or provoking factors  Assessment & Plan: Principal Problem:   Severe sepsis ([place]) Active Problems:   CAD, NATIVE VESSEL   GERD   Essential hypertension   Lower urinary tract infectious disease   Diabetes mellitus with neuropathy (HCC)   Lactic acidosis   Severe sepsis - Patient sepsis physiology starting to improve, however she still has soft blood pressures this morning, continue to monitor in stepdown - Lactic acid improved with IV fluids, continue fluids for today - Cultures are pending  UTI - Continue ceftriaxone  Acute encephalopathy  - Possibly related to sepsis, she is slightly confused this morning, not oriented to time, we'll check an ammonia level   Hypertension - Hold home antihypertensives in the setting of sepsis  Liver cirrhosis with presence of hyponatremia, elevated bilirubin, and thrombocytopenia - She appears slightly fluid overloaded with hyponatremia, stable - continue to monitor LFTs and platelets  Anemia - Hemoglobin slightly lower than baseline, looks dilutional after fluid  resuscitation  Diabetes mellitus - Most recent hemoglobin A1c was controlled at 6.1, continue Levemir and sliding scale  CAD - stable, no chest pain   DVT prophylaxis: Lovenox Code Status: Partial Code Family Communication: no family bedside Disposition Plan: TBD, remaining step down for now  Consultants:   None   Procedures:   None   Antimicrobials:  Ceftriaxone 12/8 >>   Subjective: - States she is feeling better than last night, appears mildly confused  Objective: Vitals:   10/20/16 0700 10/20/16 0746 10/20/16 0800 10/20/16 0900  BP: (!) 98/50  (!) 127/55 103/78  Pulse: 69 72 74 85  Resp: 18 17 16  (!) 23  Temp:  97.3 F (36.3 C)    TempSrc:  Oral    SpO2: 98% 99% 100% 98%  Weight:      Height:        Intake/Output Summary (Last 24 hours) at 10/20/16 1035 Last data filed at 10/20/16 0900  Gross per 24 hour  Intake          3568.75 ml  Output               13 ml  Net          3555.75 ml   Filed Weights   10/19/16 1329 10/19/16 1803 10/20/16 0400  Weight: 112.5 kg (248 lb) 118.7 kg (261 lb 11 oz) 119.9 kg (264 lb 5.3 oz)    Examination: Constitutional: NAD Vitals:   10/20/16 0700 10/20/16 0746 10/20/16 0800 10/20/16 0900  BP: (!) 98/50  (!) 127/55 103/78  Pulse: 69 72 74 85  Resp: 18 17  16 (!) 23  Temp:  97.3 F (36.3 C)    TempSrc:  Oral    SpO2: 98% 99% 100% 98%  Weight:      Height:       Eyes: PERRL, lids and conjunctivae normal ENMT: Mucous membranes are moist.  Respiratory: clear to auscultation bilaterally, no wheezing, no crackles.  Cardiovascular: Regular rate and rhythm, no murmurs / rubs / gallops. 1-2+ LE edema.  Abdomen: no tenderness. Bowel sounds positive.  Skin: no rashes, lesions, ulcers. No induration Neurologic: non focal  Psychiatric: Normal judgment and insight. Alert and oriented x to person and place, not time   Data Reviewed: I have personally reviewed following labs and imaging studies  CBC:  Recent Labs Lab  10/19/16 1424 10/20/16 0414  WBC 19.3* 5.2  NEUTROABS 17.7*  --   HGB 11.3* 8.5*  HCT 34.9* 28.5*  MCV 81.0 81.4  PLT 118* 73*   Basic Metabolic Panel:  Recent Labs Lab 10/19/16 1424 10/20/16 0414  NA 129* 134*  K 3.4* 4.2  CL 101 109  CO2 19* 20*  GLUCOSE 224* 211*  BUN 26* 34*  CREATININE 0.94 1.02*  CALCIUM 7.8* 7.4*   GFR: Estimated Creatinine Clearance: 66.9 mL/min (by C-G formula based on SCr of 1.02 mg/dL (H)). Liver Function Tests:  Recent Labs Lab 10/19/16 1424  AST 23  ALT 12*  ALKPHOS 71  BILITOT 2.1*  PROT 5.3*  ALBUMIN 2.8*   No results for input(s): LIPASE, AMYLASE in the last 168 hours. No results for input(s): AMMONIA in the last 168 hours. Coagulation Profile: No results for input(s): INR, PROTIME in the last 168 hours. Cardiac Enzymes:  Recent Labs Lab 10/19/16 1424  TROPONINI <0.03   BNP (last 3 results) No results for input(s): PROBNP in the last 8760 hours. HbA1C: No results for input(s): HGBA1C in the last 72 hours. CBG:  Recent Labs Lab 10/19/16 2035 10/20/16 0038 10/20/16 0540 10/20/16 0742  GLUCAP 212* 208* 216* 188*   Lipid Profile: No results for input(s): CHOL, HDL, LDLCALC, TRIG, CHOLHDL, LDLDIRECT in the last 72 hours. Thyroid Function Tests: No results for input(s): TSH, T4TOTAL, FREET4, T3FREE, THYROIDAB in the last 72 hours. Anemia Panel: No results for input(s): VITAMINB12, FOLATE, FERRITIN, TIBC, IRON, RETICCTPCT in the last 72 hours. Urine analysis:    Component Value Date/Time   COLORURINE AMBER (A) 10/19/2016 1332   APPEARANCEUR CLOUDY (A) 10/19/2016 1332   LABSPEC 1.021 10/19/2016 1332   PHURINE 5.0 10/19/2016 1332   GLUCOSEU NEGATIVE 10/19/2016 1332   HGBUR MODERATE (A) 10/19/2016 1332   BILIRUBINUR NEGATIVE 10/19/2016 1332   KETONESUR NEGATIVE 10/19/2016 1332   PROTEINUR 30 (A) 10/19/2016 1332   UROBILINOGEN 1.0 01/18/2015 0139   NITRITE POSITIVE (A) 10/19/2016 1332   LEUKOCYTESUR TRACE (A)  10/19/2016 1332   Sepsis Labs: Invalid input(s): PROCALCITONIN, LACTICIDVEN  Recent Results (from the past 240 hour(s))  Blood Culture (routine x 2)     Status: None (Preliminary result)   Collection Time: 10/19/16  2:04 PM  Result Value Ref Range Status   Specimen Description BLOOD RIGHT THUMB  Final   Special Requests BOTTLES DRAWN AEROBIC ONLY 4CC  Final   Culture NO GROWTH < 24 HOURS  Final   Report Status PENDING  Incomplete  Blood Culture (routine x 2)     Status: None (Preliminary result)   Collection Time: 10/19/16  2:17 PM  Result Value Ref Range Status   Specimen Description BLOOD RIGHT HAND  Final  Special Requests BOTTLES DRAWN AEROBIC ONLY 4CC  Final   Culture NO GROWTH < 24 HOURS  Final   Report Status PENDING  Incomplete  MRSA PCR Screening     Status: None   Collection Time: 10/19/16  6:02 PM  Result Value Ref Range Status   MRSA by PCR NEGATIVE NEGATIVE Final    Comment:        The GeneXpert MRSA Assay (FDA approved for NASAL specimens only), is one component of a comprehensive MRSA colonization surveillance program. It is not intended to diagnose MRSA infection nor to guide or monitor treatment for MRSA infections.       Radiology Studies: Dg Chest Port 1 View  Result Date: 10/19/2016 CLINICAL DATA:  Fever and altered mental status EXAM: PORTABLE CHEST 1 VIEW COMPARISON:  September 28, 2016 FINDINGS: Cardiomegaly. Poor evaluation of the left retrocardiac region on the frontal view. No interval changes or acute abnormalities are seen. IMPRESSION: No acute abnormalities seen on this limited portable view. Electronically Signed   By: Dorise Bullion III M.D   On: 10/19/2016 15:10     Scheduled Meds: . ALPRAZolam  0.5 mg Oral BID  . aspirin EC  81 mg Oral Daily  . cefTRIAXone (ROCEPHIN)  IV  2 g Intravenous Q24H  . chlorhexidine  15 mL Mouth Rinse BID  . enoxaparin (LOVENOX) injection  40 mg Subcutaneous Q24H  . fluconazole  150 mg Oral Once per day on  Tue Sat  . insulin aspart  0-15 Units Subcutaneous Q4H  . insulin glargine  25 Units Subcutaneous QHS  . lactulose  20 g Oral TID  . mouth rinse  15 mL Mouth Rinse q12n4p  . mometasone-formoterol  2 puff Inhalation BID  . pantoprazole  40 mg Oral Daily  . pregabalin  100 mg Oral BID  . rifaximin  550 mg Oral BID  . spironolactone  50 mg Oral Daily  . ursodiol  600 mg Oral BID   Continuous Infusions: . 0.9 % NaCl with KCl 40 mEq / L 75 mL/hr at 10/20/16 0900    Marzetta Board, MD, PhD [facility] Pager [phone]  If 7PM-7AM, please contact night-coverage [email or web address] Password [email or web address] 10/20/2016, 10:35 AM

## 2016-10-21 LAB — GLUCOSE, CAPILLARY
GLUCOSE-CAPILLARY: 292 mg/dL — AB (ref 65–99)
GLUCOSE-CAPILLARY: 272 mg/dL — AB (ref 65–99)
Glucose-Capillary: 189 mg/dL — ABNORMAL HIGH (ref 65–99)
Glucose-Capillary: 245 mg/dL — ABNORMAL HIGH (ref 65–99)
Glucose-Capillary: 264 mg/dL — ABNORMAL HIGH (ref 65–99)
Glucose-Capillary: 282 mg/dL — ABNORMAL HIGH (ref 65–99)

## 2016-10-21 LAB — COMPREHENSIVE METABOLIC PANEL
ALBUMIN: 2.4 g/dL — AB (ref 3.5–5.0)
ALK PHOS: 52 U/L (ref 38–126)
ALT: 11 U/L — AB (ref 14–54)
ANION GAP: 5 (ref 5–15)
AST: 19 U/L (ref 15–41)
BUN: 29 mg/dL — AB (ref 6–20)
CALCIUM: 7.7 mg/dL — AB (ref 8.9–10.3)
CO2: 17 mmol/L — AB (ref 22–32)
CREATININE: 0.74 mg/dL (ref 0.44–1.00)
Chloride: 114 mmol/L — ABNORMAL HIGH (ref 101–111)
GFR calc Af Amer: >60 mL/min (ref >60)
GFR calc non Af Amer: >60 mL/min (ref >60)
GLUCOSE: 248 mg/dL — AB (ref 65–99)
Potassium: 5 mmol/L (ref 3.5–5.1)
Sodium: 136 mmol/L (ref 135–145)
Total Bilirubin: 0.7 mg/dL (ref 0.3–1.2)
Total Protein: 4.7 g/dL — ABNORMAL LOW (ref 6.5–8.1)

## 2016-10-21 LAB — CBC
HCT: 30.3 % — ABNORMAL LOW (ref 36.0–46.0)
Hemoglobin: 10 g/dL — ABNORMAL LOW (ref 12.0–15.0)
MCH: 27 pg (ref 26.0–34.0)
MCHC: 33 g/dL (ref 30.0–36.0)
MCV: 81.9 fL (ref 78.0–100.0)
Platelets: 87 10*3/uL — ABNORMAL LOW (ref 150–400)
RBC: 3.7 MIL/uL — ABNORMAL LOW (ref 3.87–5.11)
RDW: 14.8 % (ref 11.5–15.5)
WBC: 7 10*3/uL (ref 4.0–10.5)

## 2016-10-21 LAB — PROTIME-INR
INR: 1.2
Prothrombin Time: 15.3 seconds — ABNORMAL HIGH (ref 11.4–15.2)

## 2016-10-21 MED ORDER — DEXTROSE 5 % IV SOLN
INTRAVENOUS | Status: AC
  Filled 2016-10-21: qty 2

## 2016-10-21 NOTE — Progress Notes (Signed)
PROGRESS NOTE  [name] [id number] DOB: [birth year] DOA: 10/19/2016 PCP: Purvis Kilts, MD   LOS: 2 days   Brief Narrative: [name] [name] is a 75 y.o. female with a history of dementia, [name] cirrhosis, coronary artery disease, GERD, obesity, type 2 diabetes, hypertension. Patient presents with 5 days of gradual decline and weakness, resulting in fever today. The patient was seen by her primary care physician 2 days ago and was diagnosed with UTI, however she was not started on antibiotics. Her PCP did instruct her to come to the emergency department as he thought she needed to be admitted, however the patient did not come. Patient had fevers today and is slightly confused per her husband. Her appetite has decreased and she has become more week. Patient was admitted back in mid November with a UTI. Her husband says that this has never fully gone away. No palliating or provoking factors  Assessment & Plan: Principal Problem:   Severe sepsis ([place]) Active Problems:   CAD, NATIVE VESSEL   GERD   Essential hypertension   Lower urinary tract infectious disease   Diabetes mellitus with neuropathy (HCC)   Lactic acidosis   Severe sepsis - Patient sepsis physiology improved, she is no longer hypotensive, her lactic acid is improved with IV fluids. Transfer patient out of the stepdown today on the regular floor, stop IV fluids and allow diet. Urine cultures showed 10^5 gram-negative rods, final speciation pending. Blood cultures are negative. Continue IV ceftriaxone for now.  Acute encephalopathy  - May be multifactorial possibly related to sepsis as well as mild elevation of her ammonia suggesting a component of hepatic encephalopathy decompensated by the presence of an active infection. Continue lactulose and rifaximin, she is a lot more clear today however still not oriented to time. I wonder whether this is her baseline.  UTI - Continue ceftriaxone as above, cultures are  pending.  Hypertension - Continue to hold her enalapril as well as spironolactone / Lasix, can probably resume tomorrow if she is doing well off of IV fluids  Liver cirrhosis with presence of hyponatremia, elevated bilirubin, and thrombocytopenia - stop IV fluids and continue to hold diuretics until her pressures stay stable  - continue to monitor LFTs and platelets. Her bilirubin normalized today and her platelets are improving - Sodium is actually improved with fluids suggesting a component of intravascular depletion. Her albumin is not terribly low, is 2.4 today  Anemia - Hemoglobin slightly lower than baseline, looks dilutional after fluid resuscitation  Diabetes mellitus - Most recent hemoglobin A1c was controlled at 6.1, continue Levemir and sliding scale, CBG is reasonably controlled with fasting CBG this morning 189  CAD - stable, no chest pain    DVT prophylaxis: Lovenox Code Status: Partial Code Family Communication: no family bedside Disposition Plan: Transfer to telemetry today; expect discharge in about 2 days  Consultants:   None   Procedures:   None   Antimicrobials:  Ceftriaxone 12/8 >>   Subjective: - She is feeling a lot better, she appears more alert, denies any chest pain, she has no abdominal pain, no nausea, vomiting or diarrhea. She has no shortness of breath.  Objective: Vitals:   10/21/16 0400 10/21/16 0500 10/21/16 0600 10/21/16 0820  BP: (!) 140/57 136/61 139/60   Pulse: 75 75 75   Resp: (!) 21 20 20    Temp: 98.2 F (36.8 C)   97.9 F (36.6 C)  TempSrc: Oral   Oral  SpO2: 97% 97% 96%  Weight:  122.7 kg (270 lb 8.1 oz)    Height:        Intake/Output Summary (Last 24 hours) at 10/21/16 0950 Last data filed at 10/21/16 0600  Gross per 24 hour  Intake             2345 ml  Output                0 ml  Net             2345 ml   Filed Weights   10/19/16 1803 10/20/16 0400 10/21/16 0500  Weight: 118.7 kg (261 lb 11 oz) 119.9 kg (264  lb 5.3 oz) 122.7 kg (270 lb 8.1 oz)   Examination: Constitutional: NAD Vitals:   10/21/16 0400 10/21/16 0500 10/21/16 0600 10/21/16 0820  BP: (!) 140/57 136/61 139/60   Pulse: 75 75 75   Resp: (!) 21 20 20    Temp: 98.2 F (36.8 C)   97.9 F (36.6 C)  TempSrc: Oral   Oral  SpO2: 97% 97% 96%   Weight:  122.7 kg (270 lb 8.1 oz)    Height:       Eyes: PERRL, lids and conjunctivae normal ENMT: Mucous membranes are moist.  Respiratory: clear to auscultation bilaterally, no wheezing, no crackles.  Cardiovascular: Regular rate and rhythm, no murmurs / rubs / gallops. 1-2+ LE edema.  Abdomen: no tenderness. Bowel sounds positive.  Neurologic: non focal  Psychiatric: Normal judgment and insight. Alert and oriented x to person and place, not time  Data Reviewed: I have personally reviewed following labs and imaging studies  CBC:  Recent Labs Lab 10/19/16 1424 10/20/16 0414 10/21/16 0424  WBC 19.3* 5.2 7.0  NEUTROABS 17.7*  --   --   HGB 11.3* 8.5* 10.0*  HCT 34.9* 28.5* 30.3*  MCV 81.0 81.4 81.9  PLT 118* 73* 87*   Basic Metabolic Panel:  Recent Labs Lab 10/19/16 1424 10/20/16 0414 10/21/16 0424  NA 129* 134* 136  K 3.4* 4.2 5.0  CL 101 109 114*  CO2 19* 20* 17*  GLUCOSE 224* 211* 248*  BUN 26* 34* 29*  CREATININE 0.94 1.02* 0.74  CALCIUM 7.8* 7.4* 7.7*   GFR: Estimated Creatinine Clearance: 86.4 mL/min (by C-G formula based on SCr of 0.74 mg/dL). Liver Function Tests:  Recent Labs Lab 10/19/16 1424 10/21/16 0424  AST 23 19  ALT 12* 11*  ALKPHOS 71 52  BILITOT 2.1* 0.7  PROT 5.3* 4.7*  ALBUMIN 2.8* 2.4*   No results for input(s): LIPASE, AMYLASE in the last 168 hours.  Recent Labs Lab 10/20/16 1149  AMMONIA 59*   Coagulation Profile:  Recent Labs Lab 10/21/16 0424  INR 1.20   Cardiac Enzymes:  Recent Labs Lab 10/19/16 1424  TROPONINI <0.03   CBG:  Recent Labs Lab 10/20/16 1640 10/20/16 1956 10/21/16 0009 10/21/16 0402  10/21/16 0840  GLUCAP 258* 218* 264* 245* 189*   Urine analysis:    Component Value Date/Time   COLORURINE AMBER (A) 10/19/2016 1332   APPEARANCEUR CLOUDY (A) 10/19/2016 1332   LABSPEC 1.021 10/19/2016 1332   PHURINE 5.0 10/19/2016 1332   GLUCOSEU NEGATIVE 10/19/2016 1332   HGBUR MODERATE (A) 10/19/2016 1332   BILIRUBINUR NEGATIVE 10/19/2016 1332   KETONESUR NEGATIVE 10/19/2016 1332   PROTEINUR 30 (A) 10/19/2016 1332   UROBILINOGEN 1.0 01/18/2015 0139   NITRITE POSITIVE (A) 10/19/2016 1332   LEUKOCYTESUR TRACE (A) 10/19/2016 1332   Sepsis Labs: Invalid input(s): PROCALCITONIN, LACTICIDVEN  Recent  Results (from the past 240 hour(s))  Urine culture     Status: Abnormal (Preliminary result)   Collection Time: 10/19/16  1:32 PM  Result Value Ref Range Status   Specimen Description URINE, CATHETERIZED  Final   Special Requests NONE  Final   Culture >=100,000 COLONIES/mL GRAM NEGATIVE RODS (A)  Final   Report Status PENDING  Incomplete  Blood Culture (routine x 2)     Status: None (Preliminary result)   Collection Time: 10/19/16  2:04 PM  Result Value Ref Range Status   Specimen Description BLOOD RIGHT THUMB  Final   Special Requests BOTTLES DRAWN AEROBIC ONLY 4CC  Final   Culture NO GROWTH < 24 HOURS  Final   Report Status PENDING  Incomplete  Blood Culture (routine x 2)     Status: None (Preliminary result)   Collection Time: 10/19/16  2:17 PM  Result Value Ref Range Status   Specimen Description BLOOD RIGHT HAND  Final   Special Requests BOTTLES DRAWN AEROBIC ONLY 4CC  Final   Culture NO GROWTH < 24 HOURS  Final   Report Status PENDING  Incomplete  MRSA PCR Screening     Status: None   Collection Time: 10/19/16  6:02 PM  Result Value Ref Range Status   MRSA by PCR NEGATIVE NEGATIVE Final    Comment:        The GeneXpert MRSA Assay (FDA approved for NASAL specimens only), is one component of a comprehensive MRSA colonization surveillance program. It is not intended  to diagnose MRSA infection nor to guide or monitor treatment for MRSA infections.     Radiology Studies: Dg Chest Port 1 View  Result Date: 10/19/2016 CLINICAL DATA:  Fever and altered mental status EXAM: PORTABLE CHEST 1 VIEW COMPARISON:  September 28, 2016 FINDINGS: Cardiomegaly. Poor evaluation of the left retrocardiac region on the frontal view. No interval changes or acute abnormalities are seen. IMPRESSION: No acute abnormalities seen on this limited portable view. Electronically Signed   By: Dorise Bullion III M.D   On: 10/19/2016 15:10   Scheduled Meds: . ALPRAZolam  0.5 mg Oral BID  . aspirin EC  81 mg Oral Daily  . cefTRIAXone (ROCEPHIN)  IV  2 g Intravenous Q24H  . chlorhexidine  15 mL Mouth Rinse BID  . enoxaparin (LOVENOX) injection  40 mg Subcutaneous Q24H  . fluconazole  150 mg Oral Once per day on Tue Sat  . insulin aspart  0-15 Units Subcutaneous Q4H  . insulin glargine  25 Units Subcutaneous QHS  . lactulose  20 g Oral TID  . mouth rinse  15 mL Mouth Rinse q12n4p  . mometasone-formoterol  2 puff Inhalation BID  . pantoprazole  40 mg Oral Daily  . pregabalin  100 mg Oral BID  . rifaximin  550 mg Oral BID  . spironolactone  50 mg Oral Daily  . ursodiol  600 mg Oral BID   Continuous Infusions: . 0.9 % NaCl with KCl 40 mEq / L 75 mL/hr (10/20/16 2006)    Marzetta Board, MD, PhD [facility] Pager [phone]  If 7PM-7AM, please contact night-coverage [email or web address] Password [email or web address] 10/21/2016, 9:50 AM

## 2016-10-22 DIAGNOSIS — K7290 Hepatic failure, unspecified without coma: Secondary | ICD-10-CM

## 2016-10-22 DIAGNOSIS — E0840 Diabetes mellitus due to underlying condition with diabetic neuropathy, unspecified: Secondary | ICD-10-CM

## 2016-10-22 DIAGNOSIS — N390 Urinary tract infection, site not specified: Secondary | ICD-10-CM

## 2016-10-22 DIAGNOSIS — I10 Essential (primary) hypertension: Secondary | ICD-10-CM

## 2016-10-22 LAB — COMPREHENSIVE METABOLIC PANEL
ALK PHOS: 58 U/L (ref 38–126)
ALT: 12 U/L — AB (ref 14–54)
AST: 15 U/L (ref 15–41)
Albumin: 2.5 g/dL — ABNORMAL LOW (ref 3.5–5.0)
Anion gap: 4 — ABNORMAL LOW (ref 5–15)
BUN: 18 mg/dL (ref 6–20)
CALCIUM: 8.2 mg/dL — AB (ref 8.9–10.3)
CO2: 21 mmol/L — AB (ref 22–32)
CREATININE: 0.6 mg/dL (ref 0.44–1.00)
Chloride: 108 mmol/L (ref 101–111)
GFR calc Af Amer: >60 mL/min (ref >60)
GFR calc non Af Amer: >60 mL/min (ref >60)
Glucose, Bld: 172 mg/dL — ABNORMAL HIGH (ref 65–99)
Potassium: 4.3 mmol/L (ref 3.5–5.1)
SODIUM: 133 mmol/L — AB (ref 135–145)
Total Bilirubin: 0.7 mg/dL (ref 0.3–1.2)
Total Protein: 5.1 g/dL — ABNORMAL LOW (ref 6.5–8.1)

## 2016-10-22 LAB — GLUCOSE, CAPILLARY
GLUCOSE-CAPILLARY: 172 mg/dL — AB (ref 65–99)
GLUCOSE-CAPILLARY: 216 mg/dL — AB (ref 65–99)
GLUCOSE-CAPILLARY: 221 mg/dL — AB (ref 65–99)
Glucose-Capillary: 161 mg/dL — ABNORMAL HIGH (ref 65–99)
Glucose-Capillary: 139 mg/dL — ABNORMAL HIGH (ref 65–99)
Glucose-Capillary: 221 mg/dL — ABNORMAL HIGH (ref 65–99)

## 2016-10-22 LAB — URINE CULTURE

## 2016-10-22 LAB — CBC
HCT: 30.5 % — ABNORMAL LOW (ref 36.0–46.0)
HEMOGLOBIN: 10 g/dL — AB (ref 12.0–15.0)
MCH: 26.6 pg (ref 26.0–34.0)
MCHC: 32.8 g/dL (ref 30.0–36.0)
MCV: 81.1 fL (ref 78.0–100.0)
Platelets: 99 10*3/uL — ABNORMAL LOW (ref 150–400)
RBC: 3.76 MIL/uL — AB (ref 3.87–5.11)
RDW: 14.6 % (ref 11.5–15.5)
WBC: 3.6 10*3/uL — ABNORMAL LOW (ref 4.0–10.5)

## 2016-10-22 LAB — PROTIME-INR
INR: 1.11
Prothrombin Time: 14.3 seconds (ref 11.4–15.2)

## 2016-10-22 MED ORDER — FUROSEMIDE 10 MG/ML IJ SOLN
40.0000 mg | Freq: Two times a day (BID) | INTRAMUSCULAR | Status: DC
  Administered 2016-10-22 – 2016-10-23 (×3): 40 mg via INTRAVENOUS
  Filled 2016-10-22 (×3): qty 4

## 2016-10-22 MED ORDER — ALBUMIN HUMAN 25 % IV SOLN
50.0000 g | Freq: Once | INTRAVENOUS | Status: AC
  Administered 2016-10-22: 50 g via INTRAVENOUS
  Filled 2016-10-22: qty 200

## 2016-10-22 MED ORDER — INSULIN ASPART 100 UNIT/ML ~~LOC~~ SOLN
0.0000 [IU] | Freq: Three times a day (TID) | SUBCUTANEOUS | Status: DC
  Administered 2016-10-23: 3 [IU] via SUBCUTANEOUS
  Administered 2016-10-23: 15 [IU] via SUBCUTANEOUS

## 2016-10-22 NOTE — Progress Notes (Signed)
Pt had BM  mooshy and brown with urine also , unable to measure

## 2016-10-22 NOTE — NC FL2 (Signed)
[place] LEVEL OF CARE SCREENING TOOL     IDENTIFICATION  Patient Name: [name] [name] Birthdate: 07/23/1941 Sex: female Admission Date (Current Location): 10/19/2016  [place] and [place] Number:  [place] [id number] [place] and Address:  [facility] [address] [place]      Provider Number: [id number]  Attending Physician Name and Address:  Louellen Molder, MD  Relative Name and Phone Number:       Current Level of Care: Hospital Recommended Level of Care: [place] Prior Approval Number:    Date Approved/Denied:   PASRR Number: [id number]  Discharge Plan: SNF    Current Diagnoses: Patient Active Problem List   Diagnosis Date Noted  . Severe sepsis ([place]) 10/19/2016  . Lactic acidosis 10/19/2016  . Leg edema, left 09/28/2016  . Encephalopathy 08/03/2016  . Hepatic encephalopathy ([place])   . Leukopenia 08/02/2016  . Diabetes mellitus with neuropathy ([place]) 04/23/2016  . Lower urinary tract infectious disease   . Fall 12/24/2014  . Encounter for examination for admission to nursing home 12/16/2014  . Hyperammonemia ([place]) 12/16/2014  . Weakness 12/15/2014  . Physical deconditioning 08/18/2014  . Morbid obesity ([place]) 08/18/2014  . Elevated LFTs 07/29/2014  . Left-sided weakness 07/12/2014  . Pancreatitis, acute 04/12/2014  . Pancytopenia, acquired ([place]) 04/12/2014  . Poor balance 10/14/2013  . Acute encephalopathy 07/30/2013  . Encephalopathy, hepatic ([place]) 07/30/2013  . Confusion 07/29/2013  . Edema of left lower extremity 04/14/2013  . Generalized weakness 02/19/2013  . Urinary tract infection without hematuria 02/14/2013  . Fracture of humeral shaft, right, closed 02/11/2013  . Multiple falls 01/26/2013  . Difficulty walking 01/26/2013  . Dizziness 09/02/2012  . Frequent falls 08/08/2012  . Ataxia 08/08/2012  . Peripheral neuropathy ([place]) 08/08/2012  . 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 Normocytic anemia 04/08/2012  . Posterior tibial tendon dysfunction 07/12/2011  . Hyperlipidemia 05/31/2011  . Essential hypertension 05/31/2011  . GERD 09/29/2010  . Liver cirrhosis secondary to NASH ([place]) 09/29/2010  . History of colonic polyps 02/13/2010  . Thrombocytopenia ([place]) 01/04/2010  . Hereditary and idiopathic peripheral neuropathy 01/04/2010  . CAD, NATIVE VESSEL 01/04/2010  . ANXIETY DISORDER, HX OF 01/04/2010  . Intrinsic asthma, unspecified 03/16/2009  . ALLERGIC RHINITIS 02/18/2009    Orientation RESPIRATION BLADDER Height & Weight     Self, Time, Situation, Place  Normal Incontinent Weight: 270 lb 8.1 oz (122.7 kg) Height:  5\' 8"  (172.7 cm)  BEHAVIORAL SYMPTOMS/MOOD NEUROLOGICAL BOWEL NUTRITION STATUS  Other (Comment) (none)  (n/a) Incontinent Diet (Heart healthy)  AMBULATORY STATUS COMMUNICATION OF NEEDS Skin   Total Care Verbally Normal                       Personal Care Assistance Level of Assistance  Bathing, Feeding, Dressing Bathing Assistance: Maximum assistance Feeding assistance: Limited assistance Dressing Assistance: Maximum assistance     Functional Limitations Info  Sight, Hearing, Speech Sight Info: Adequate Hearing Info: Adequate Speech Info: Adequate    SPECIAL CARE FACTORS FREQUENCY  PT (By licensed PT)     PT Frequency: 5              Contractures      Additional Factors Info  Insulin Sliding Scale, Psychotropic Code Status Info: Partial code Allergies Info: Morphine and Related, Compazine (Prochlorperazine Edisylate) Psychotropic Info: Xanax         Current Medications (10/22/2016):  This is the  current hospital active medication list Current Facility-Administered Medications  Medication Dose Route Frequency Provider Last Rate Last Dose  . acetaminophen (TYLENOL) tablet 650 mg  650 mg Oral Q6H PRN Tanna Savoy Stinson, DO   650 mg at 10/21/16 0025   Or  . acetaminophen (TYLENOL) suppository  650 mg  650 mg Rectal Q6H PRN Truett Mainland, DO      . ALPRAZolam Duanne Moron) tablet 0.5 mg  0.5 mg Oral BID Tanna Savoy Stinson, DO   0.5 mg at 10/22/16 1000  . aspirin EC tablet 81 mg  81 mg Oral Daily Tanna Savoy Stinson, DO   81 mg at 10/22/16 1000  . cefTRIAXone (ROCEPHIN) 2 g in dextrose 5 % 50 mL IVPB  2 g Intravenous Q24H Tanna Savoy Stinson, DO   2 g at 10/21/16 2134  . chlorhexidine (PERIDEX) 0.12 % solution 15 mL  15 mL Mouth Rinse BID Neville Route, MD   15 mL at 10/22/16 1001  . enoxaparin (LOVENOX) injection 40 mg  40 mg Subcutaneous Q24H Tanna Savoy Stinson, DO   40 mg at 10/21/16 2135  . fluconazole (DIFLUCAN) tablet 150 mg  150 mg Oral Once per day on Tue Sat Truett Mainland, DO   150 mg at 10/20/16 [id number]  . furosemide (LASIX) injection 40 mg  40 mg Intravenous Q12H Nishant Dhungel, MD   40 mg at 10/22/16 1130  . insulin aspart (novoLOG) injection 0-15 Units  0-15 Units Subcutaneous Q4H Tanna Savoy Stinson, DO   5 Units at 10/22/16 1228  . insulin glargine (LANTUS) injection 25 Units  25 Units Subcutaneous QHS Ritta Slot, NP   25 Units at 10/22/16 0000  . lactulose (CHRONULAC) 10 GM/15ML solution 20 g  20 g Oral TID Tanna Savoy Stinson, DO   20 g at 10/22/16 1001  . meclizine (ANTIVERT) tablet 25 mg  25 mg Oral TID PRN Tanna Savoy Stinson, DO      . MEDLINE mouth rinse  15 mL Mouth Rinse q12n4p Neville Route, MD   15 mL at 10/22/16 1228  . mometasone-formoterol (DULERA) 200-5 MCG/ACT inhaler 2 puff  2 puff Inhalation BID Truett Mainland, DO   2 puff at 10/22/16 0809  . ondansetron (ZOFRAN) tablet 4 mg  4 mg Oral Q6H PRN Tanna Savoy Stinson, DO       Or  . ondansetron [facility]) injection 4 mg  4 mg Intravenous Q6H PRN Tanna Savoy Stinson, DO      . oxyCODONE (Oxy IR/ROXICODONE) immediate release tablet 5 mg  5 mg Oral BID PRN Truett Mainland, DO   5 mg at 10/20/16 2006  . pantoprazole (PROTONIX) EC tablet 40 mg  40 mg Oral Daily Tanna Savoy Stinson, DO   40 mg at 10/22/16 1000  . polyethylene glycol (MIRALAX /  GLYCOLAX) packet 17 g  17 g Oral Daily PRN Tanna Savoy Stinson, DO      . pregabalin (LYRICA) capsule 100 mg  100 mg Oral BID Tanna Savoy Stinson, DO   100 mg at 10/22/16 0959  . rifaximin (XIFAXAN) tablet 550 mg  550 mg Oral BID Tanna Savoy Stinson, DO   550 mg at 10/22/16 1000  . spironolactone (ALDACTONE) tablet 50 mg  50 mg Oral Daily Tanna Savoy Stinson, DO   50 mg at 10/22/16 1000  . ursodiol (ACTIGALL) capsule 600 mg  600 mg Oral BID Tanna Savoy Stinson, DO   600 mg at 10/22/16 1001     Discharge  Medications: Please see discharge summary for a list of discharge medications.  Relevant Imaging Results:  Relevant Lab Results:   Additional Information SSN: [id number]. Uses lift for transfers.   [name] Bland, [place]

## 2016-10-22 NOTE — Evaluation (Signed)
Physical Therapy Evaluation Patient Details Name: [name] [name] MRN: [id number] DOB: 19-Jan-1941 Today's Date: 10/22/2016   History of Present Illness  75 yo F admitted after patient had reportedly been in her usual state of health until she was noted to be increasingly lethargic and confused by family approximately 3 days ago. Patient is not had a fever, apparent respiratory distress, or voiced any specific complaints.  The patient has had similar symptoms multiple times in the past, typically when her ammonia level is elevated or she has a UTI.  Ammonia level is elevated to 93.  Urinalysis features many bacteria, small leukocytes, positive nitrite, and 0-5 WBC.  Dx:  acute encephalopathy, suspect to be multifactorial with contributions from hepatic encephalopathy and UTI.  PMH: L anterior fasicular block, B LE weakness, CAD, chronic shoulder pain, cirrhosis of liver NASH, colon adenomas, diverticula of colon, obesity, pancreatitis, pancytopenia, neurosis, thrombocytopenia, DM2, HTN, fall, idiopathic peripheral neuropathy, vertigo, breast surgery, L foot surgery, ORIF R humerus fx, tumor excision R arm and L foot.   Clinical Impression  Pt received in bed, and was agreeable to PT evaluation.  Husband arrived during PT eval, and clarified history.  Pt lives at home and has assistance of aides for 8 hrs per day as well as her children rotate coming to assist with meals and getting in the bed.  She mobilizes via w/c most of the time, and uses a hoyer lift to transfer from the bed<>chair.  During her last admission back in September, she was able to perform a stand pivot transfer from the bed<>chair with Max A.  Recommending SNF at this time due to increased level of assistance she is requiring for basic functional mobility needs at this time.  If she does not go to SNF she would be recommended for HHPT.    Follow Up Recommendations SNF;Supervision/Assistance - 24 hour    Equipment Recommendations  None  recommended by PT    Recommendations for Other Services       Precautions / Restrictions Precautions Precautions: Fall Precaution Comments: due to immobility Restrictions Weight Bearing Restrictions: Yes RUE Weight Bearing: Non weight bearing (due to previous fx. and pt comfort. )      Mobility  Bed Mobility Overal bed mobility: Needs Assistance Bed Mobility: Supine to Sit;Sit to Supine;Rolling Rolling: Max assist (Due to pt's lethargy.  )   Supine to sit: HOB elevated Sit to supine: Total assist   General bed mobility comments: Pt requires constant assistance while sitting up on the EOB.  supine scoot with bed in trendelenburg with max A - pt able to assist with LE's.    Transfers Overall transfer level: Needs assistance               General transfer comment: Due to poor static sitting balance and decreased strength in UE's as well as LE's, it was decided that Maxi move would be utilized to transfer pt from the bed<>chair.    Ambulation/Gait Ambulation/Gait assistance:  (NA due to poor static sitting balance and poor transfer)              Stairs            Wheelchair Mobility    Modified Rankin (Stroke Patients Only)       Balance Overall balance assessment: Needs assistance Sitting-balance support: Bilateral upper extremity supported;Feet supported Sitting balance-Leahy Scale: Poor   Postural control: Posterior lean;Left lateral lean  Pertinent Vitals/Pain Pain Assessment: No/denies pain    Home Living   Living Arrangements: Spouse/significant other Available Help at Discharge: Personal care attendant (aide that comes from 8-4, then children come after that to assist with meals and getting her into the bed)   Home Access: Ramped entrance     Home Layout: One level Home Equipment: Walker - 2 wheels;Cane - single point;Bedside commode;Wheelchair - manual;Electric scooter;Hospital bed;Grab  bars - toilet;Grab bars - tub/shower;Hand held shower head (hoyer lift and lift chair)      Prior Function Level of Independence: Needs assistance   Gait / Transfers Assistance Needed: Pt and husband state that she has been using the hoyer lift for transfers bed<>w/c, however last admission, she was able to stand and pivot from the bed<>w/c with Max A.   ADL's / Homemaking Assistance Needed: husband and aide assist with bed mobility, transfers, dressing and bathing.  Children assist with meal prep.         Hand Dominance        Extremity/Trunk Assessment   Upper Extremity Assessment: Generalized weakness;RUE deficits/detail;LUE deficits/detail RUE Deficits / Details: Pt does not demonstrate any shoulder elevation - likely due to previous fx with conservative management.       LUE Deficits / Details: Grossly 2/5   Lower Extremity Assessment: RLE deficits/detail;LLE deficits/detail RLE Deficits / Details: Grossly 3/5 LLE Deficits / Details: Grossly 2/5     Communication   Communication: Other (comment) (lethargy)  Cognition Arousal/Alertness: Lethargic   Overall Cognitive Status: Difficult to assess                 General Comments: PT needed to awaken patient during several points during tx.      General Comments      Exercises     Assessment/Plan    PT Assessment Patient needs continued PT services  PT Problem List Decreased strength;Decreased cognition;Impaired tone;Obesity;Decreased range of motion;Decreased activity tolerance;Decreased balance;Decreased mobility;Cardiopulmonary status limiting activity          PT Treatment Interventions Therapeutic activities;Functional mobility training;Therapeutic exercise;Balance training;Patient/family education;Wheelchair mobility training    PT Goals (Current goals can be found in the Care Plan section)  Acute Rehab PT Goals Patient Stated Goal: pt would like to get stronger and go back home PT Goal  Formulation: With patient/family Time For Goal Achievement: 10/29/16 Potential to Achieve Goals: Poor    Frequency Min 3X/week   Barriers to discharge        Co-evaluation               End of Session   Activity Tolerance: Patient limited by lethargy Patient left: in chair;with call bell/phone within reach;with nursing/sitter in room Nurse Communication: Mobility status;Need for lift equipment Alyse Low, RN aware of pt's location, and recommendations to use Maxi move for transfers. )    Functional Assessment Tool Used: Jack "6-clicks"  Functional Limitation: Mobility: Walking and moving around Mobility: Walking and Moving Around Current Status [phone]): At least 80 percent but less than 100 percent impaired, limited or restricted Mobility: Walking and Moving Around Goal Status [phone]): At least 60 percent but less than 80 percent impaired, limited or restricted    Time: [id number] PT Time Calculation (min) (ACUTE ONLY): 34 min   Charges:   PT Evaluation $PT Eval Low Complexity: 1 Procedure PT Treatments $Therapeutic Activity: 8-22 mins   PT G Codes:   PT G-Codes **NOT FOR INPATIENT CLASS** Functional Assessment Tool Used: [employer] "6-clicks"  Functional Limitation: Mobility: Walking and moving around Mobility: Walking and Moving Around Current Status [phone]): At least 80 percent but less than 100 percent impaired, limited or restricted Mobility: Walking and Moving Around Goal Status [phone]): At least 60 percent but less than 80 percent impaired, limited or restricted    Beth Abdias Hickam, PT, DPT X: [phone]

## 2016-10-22 NOTE — Progress Notes (Signed)
Husband states pt was not swelling in legs like this upon arrival. Noticed yesterday. Advised will give her prn lasix. Nonpitting. Nad. No resp distress or sob noted. Pt denies sob.

## 2016-10-22 NOTE — Care Management Important Message (Signed)
Important Message  Patient Details  Name: [name] [name] MRN: [id number] Date of Birth: 10-Jan-1941   Medicare Important Message Given:  Yes    Sherald Barge, RN 10/22/2016, 4:01 PM

## 2016-10-22 NOTE — Progress Notes (Signed)
Dr Laverle Patter notified of possible repeat of lactid acid and ammonia, ble swelling worse per husband and possible swallowing screen.

## 2016-10-22 NOTE — Progress Notes (Signed)
Inpatient Diabetes Program Recommendations  AACE/ADA: New Consensus Statement on Inpatient Glycemic Control (2015)  Target Ranges:  Prepandial:   less than 140 mg/dL      Peak postprandial:   less than 180 mg/dL (1-2 hours)      Critically ill patients:  140 - 180 mg/dL   Results for ELENDER, MCMURRY (MRN ID:[id number]) as of 10/22/2016 11:48  Ref. Range 10/21/2016 08:40 10/21/2016 11:44 10/21/2016 16:07 10/21/2016 20:38  Glucose-Capillary Latest Ref Range: 65 - 99 mg/dL 189 (H) 292 (H) 272 (H) 282 (H)   Results for TALEEYAH, AZCARATE (MRN ID:[id number]) as of 10/22/2016 11:48  Ref. Range 10/22/2016 08:01 10/22/2016 11:08  Glucose-Capillary Latest Ref Range: 65 - 99 mg/dL 139 (H) 216 (H)    Admit Sepsis/ UTI.   History: DM, Dementia, Cirrhosis  Home DM Meds: Levemir 50 units QHS        Humalog 10-16 units TID  Current Orders: Lantus 25 units QHS      Novolog Moderate Correction Scale/ SSI (0-15 units) Q4 hours     MD- Please consider the following in-hospital insulin adjustments:  1. Change Novolog Moderate Correction Scale/ SSI (0-15 units) to TID AC + HS coverage (currently ordered Q4 hours and patient eating solid PO diet)  2. Start Novolog Meal Coverage: Novolog 6 units TID with meals (hold if pt eats <50% of meal)      --Will follow patient during hospitalization--  Wyn Quaker RN, MSN, CDE Diabetes Coordinator Inpatient Glycemic Control Team Team Pager: [phone] (8a-5p)

## 2016-10-22 NOTE — Clinical Social Work Note (Signed)
Clinical Social Work Assessment  Patient Details  Name: [name] [name] MRN: [id number] Date of Birth: 02-23-1941  Date of referral:  10/22/16               Reason for consult:  Discharge Planning                Permission sought to share information with:    Permission granted to share information::     Name::        Agency::     Relationship::     Contact Information:     Housing/Transportation Living arrangements for the past 2 months:  Single Family Home Source of Information:  Spouse Patient Interpreter Needed:  None Criminal Activity/Legal Involvement Pertinent to Current Situation/Hospitalization:  No - Comment as needed Significant Relationships:  Spouse Lives with:  Spouse Do you feel safe going back to the place where you live?  Yes Need for family participation in patient care:  Yes (Comment)  Care giving concerns:  Pt is not at baseline per husband.    Social Worker assessment / plan:  CSW met with pt's husband at bedside. Pt sleeping during assessment. Pt is well known to CSW from previous admissions. She has a CAP aid for 8 hours a day Monday through Friday and from 8-1 on Saturday and Sunday. At baseline, pt uses a lift for transfers to wheelchair. Family assists with feeding and all ADLs. PT evaluated pt and recommend SNF due to increased level of assistance currently required for ADLs. Pt's husband is only open to [facility] or [place]. [facility] unable to offer bed. Pt's [facility] for short term rehab and understands that per admissions, they are unable to take pt long term.   Employment status:  Retired Nurse, adult PT Recommendations:  [place] / Referral to community resources:  [place]  Patient/Family's Response to care:  Pt's husband accepts bed at [facility] for rehab.   Patient/Family's Understanding of and Emotional Response to Diagnosis, Current Treatment, and  Prognosis:  Pt's husband appears to be aware of admission diagnosis and treatment plan. He states he has spoken with admissions at [facility] and understands that offer is for short term only.   Emotional Assessment Appearance:  Appears stated age Attitude/Demeanor/Rapport:  Unable to Assess Affect (typically observed):  Unable to Assess Orientation:  Oriented to Self, Oriented to Place, Oriented to  Time, Oriented to Situation Alcohol / Substance use:  Not Applicable Psych involvement (Current and /or in the community):  No (Comment)  Discharge Needs  Concerns to be addressed:  Discharge Planning Concerns Readmission within the last 30 days:  No Current discharge risk:  Physical Impairment Barriers to Discharge:  Continued Medical Work up   [name] [name], [place] 10/22/2016, 2:48 PM [phone]

## 2016-10-22 NOTE — Progress Notes (Signed)
PROGRESS NOTE                                                                                                                                                                                                             Patient Demographics:    [name] [name], is a 75 y.o. female, DOB - 01-24-1941, LH:[id number]  Admit date - 10/19/2016   Admitting Physician Truett Mainland, DO  Outpatient Primary MD for the patient is Purvis Kilts, MD  LOS - 3  Outpatient Specialists:[facility]  Chief Complaint  Patient presents with  . Code Sepsis       Brief Narrative   76 year old obese female with NSH cirrhosis, coronary artery disease, dementia, GERD, type 2 diabetes mellitus, hypertension with 5 day history of increasing weakness with gradual decline in function. She had fever on the day of admission with increased confusion. She also had poor appetite. Her PCP saw her 2 days back and diagnosed her with UTI. She was not started on antibiotics. Patient found to be septic on admission with hypotension, and elevated lactic acid.. Patient admitted for further management.   Subjective:   Husband concerned about increased leg swellings.   Assessment  & Plan :    Principal Problem:   Severe sepsis ([place]) Secondary to Escherichia coli UTI. Sepsis now resolved. Urine culture growing Escherichia coli sensitive to ceftriaxone but resistant to penicillin, quinolone and Bactrim. Mental status not baseline per husband. Will transition to new generation cephalosporin tomorrow and discharge her home.  Active Problems: Acute encephalopathy Likely combination of hepatic encephalopathy and acute sepsis. Continue lactulose and rifaximin. Mentation at baseline (has moderate dementia).  UTI Continue Rocephin. Transition to 4th gen cephalosporin on d/c  Essential hypertension Lasix and Aldactone were held due to hypotension and sepsis on  admission. Now has increasing leg swelling. I have placed her on daily IV Lasix. Resume Aldactone. 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 Received IV fluids and diuretics held due to hypotension. Now being overloaded. Medications resumed.        Code Status :  DO Not intubated  Family Communication  :  Husband at bedside  Disposition Plan  :  Home with home health tomorrow if leg swellings improve  Barriers For Discharge :  Improving symptoms  Consults  :  None  Procedures  :  None  DVT Prophylaxis  :  SCDs Lab Results  Component Value Date   PLT 99 (L) 10/22/2016    Antibiotics  :    Anti-infectives    Start     Dose/Rate Route Frequency Ordered Stop   10/20/16 2000  cefTRIAXone (ROCEPHIN) 2 g in dextrose 5 % 50 mL IVPB     2 g 100 mL/hr over 30 Minutes Intravenous Every 24 hours 10/19/16 1813     10/20/16 1400  vancomycin (VANCOCIN) 1,250 mg in sodium chloride 0.9 % 250 mL IVPB  Status:  Discontinued     1,250 mg 166.7 mL/hr over 90 Minutes Intravenous Every 24 hours 10/19/16 1753 10/20/16 0936   10/20/16 1000  fluconazole (DIFLUCAN) tablet 150 mg    Comments:  Every 4 days     150 mg Oral Once per day on Tue Sat 10/19/16 1753     10/19/16 2200  piperacillin-tazobactam (ZOSYN) IVPB 3.375 g  Status:  Discontinued     3.375 g 12.5 mL/hr over 240 Minutes Intravenous Every 8 hours 10/19/16 1753 10/19/16 1807   10/19/16 2200  rifaximin (XIFAXAN) tablet 550 mg     550 mg Oral 2 times daily 10/19/16 1753     10/19/16 1900  cefTRIAXone (ROCEPHIN) 1 g in dextrose 5 % 50 mL IVPB     1 g 100 mL/hr over 30 Minutes Intravenous  Once 10/19/16 1859 10/19/16 2028   10/19/16 1815  cefTRIAXone (ROCEPHIN) 1 g in dextrose 5 % 50 mL IVPB     1 g 100 mL/hr over 30 Minutes Intravenous  Once 10/19/16 1814 10/19/16 1845   10/19/16 1745  cefTRIAXone (ROCEPHIN) 2 g in dextrose 5 % 50 mL IVPB  Status:  Discontinued     2 g 100 mL/hr over 30 Minutes  Intravenous Every 24 hours 10/19/16 1726 10/19/16 1814   10/19/16 1400  vancomycin (VANCOCIN) IVPB 1000 mg/200 mL premix     1,000 mg 200 mL/hr over 60 Minutes Intravenous  Once 10/19/16 1351 10/19/16 1515   10/19/16 1345  piperacillin-tazobactam (ZOSYN) IVPB 3.375 g     3.375 g 100 mL/hr over 30 Minutes Intravenous  Once 10/19/16 1344 10/19/16 1445   10/19/16 1345  vancomycin (VANCOCIN) IVPB 1000 mg/200 mL premix     1,000 mg 200 mL/hr over 60 Minutes Intravenous  Once 10/19/16 1344 10/19/16 1655        Objective:   Vitals:   10/22/16 0641 10/22/16 0812 10/22/16 1000 10/22/16 1400  BP: (!) 150/60  (!) 120/51 (!) 124/55  Pulse: 71  82 73  Resp: 18  20 18   Temp: 97.8 F (36.6 C)  98.3 F (36.8 C) 98 F (36.7 C)  TempSrc: Oral  Oral Oral  SpO2: 100% 96% 100% 98%  Weight:      Height:        Wt Readings from Last 3 Encounters:  10/21/16 122.7 kg (270 lb 8.1 oz)  09/30/16 112.5 kg (248 lb)  08/06/16 112.3 kg (247 lb 9.2 oz)     Intake/Output Summary (Last 24 hours) at 10/22/16 1556 Last data filed at 10/22/16 1422  Gross per 24 hour  Intake              480 ml  Output                0 ml  Net              480 ml     Physical Exam  Gen: not in distress HEENT:  moist mucosa, supple neck Chest: clear b/l, no added sounds CVS: N S1&S2, no murmurs, GI: soft, NT, ND, BS+ Musculoskeletal: warm, 1+ pitting edema bilaterally CNS: AAOX3,  flapping tremors   Data Review:    CBC  Recent Labs Lab 10/19/16 1424 10/20/16 0414 10/21/16 0424 10/22/16 0616  WBC 19.3* 5.2 7.0 3.6*  HGB 11.3* 8.5* 10.0* 10.0*  HCT 34.9* 28.5* 30.3* 30.5*  PLT 118* 73* 87* 99*  MCV 81.0 81.4 81.9 81.1  MCH 26.2 24.3* 27.0 26.6  MCHC 32.4 29.8* 33.0 32.8  RDW 14.6 15.1 14.8 14.6  LYMPHSABS 0.4*  --   --   --   MONOABS 1.3*  --   --   --   EOSABS 0.0  --   --   --   BASOSABS 0.0  --   --   --     Chemistries   Recent Labs Lab 10/19/16 1424 10/20/16 0414 10/21/16 0424  10/22/16 0616  NA 129* 134* 136 133*  K 3.4* 4.2 5.0 4.3  CL 101 109 114* 108  CO2 19* 20* 17* 21*  GLUCOSE 224* 211* 248* 172*  BUN 26* 34* 29* 18  CREATININE 0.94 1.02* 0.74 0.60  CALCIUM 7.8* 7.4* 7.7* 8.2*  AST 23  --  19 15  ALT 12*  --  11* 12*  ALKPHOS 71  --  52 58  BILITOT 2.1*  --  0.7 0.7   ------------------------------------------------------------------------------------------------------------------ No results for input(s): CHOL, HDL, LDLCALC, TRIG, CHOLHDL, LDLDIRECT in the last 72 hours.  Lab Results  Component Value Date   HGBA1C 6.1 (H) 09/29/2016   ------------------------------------------------------------------------------------------------------------------ No results for input(s): TSH, T4TOTAL, T3FREE, THYROIDAB in the last 72 hours.  Invalid input(s): FREET3 ------------------------------------------------------------------------------------------------------------------ No results for input(s): VITAMINB12, FOLATE, FERRITIN, TIBC, IRON, RETICCTPCT in the last 72 hours.  Coagulation profile  Recent Labs Lab 10/21/16 0424 10/22/16 0616  INR 1.20 1.11    No results for input(s): DDIMER in the last 72 hours.  Cardiac Enzymes  Recent Labs Lab 10/19/16 1424  TROPONINI <0.03   ------------------------------------------------------------------------------------------------------------------    Component Value Date/Time   BNP 59.0 03/26/2016 1456    Inpatient Medications  Scheduled Meds: . ALPRAZolam  0.5 mg Oral BID  . aspirin EC  81 mg Oral Daily  . cefTRIAXone (ROCEPHIN)  IV  2 g Intravenous Q24H  . chlorhexidine  15 mL Mouth Rinse BID  . enoxaparin (LOVENOX) injection  40 mg Subcutaneous Q24H  . fluconazole  150 mg Oral Once per day on Tue Sat  . furosemide  40 mg Intravenous Q12H  . insulin aspart  0-15 Units Subcutaneous Q4H  . insulin glargine  25 Units Subcutaneous QHS  . lactulose  20 g Oral TID  . mouth rinse  15 mL Mouth  Rinse q12n4p  . mometasone-formoterol  2 puff Inhalation BID  . pantoprazole  40 mg Oral Daily  . pregabalin  100 mg Oral BID  . rifaximin  550 mg Oral BID  . spironolactone  50 mg Oral Daily  . ursodiol  600 mg Oral BID   Continuous Infusions: PRN Meds:.acetaminophen **OR** acetaminophen, meclizine, ondansetron **OR** ondansetron (ZOFRAN) IV, oxyCODONE, polyethylene glycol  Micro Results Recent Results (from the past 240 hour(s))  Urine culture     Status: Abnormal   Collection Time: 10/19/16  1:32 PM  Result Value Ref Range Status   Specimen Description URINE, CATHETERIZED  Final   Special Requests NONE  Final  Culture >=100,000 COLONIES/mL ESCHERICHIA COLI (A)  Final   Report Status 10/22/2016 FINAL  Final   Organism ID, Bacteria ESCHERICHIA COLI (A)  Final      Susceptibility   Escherichia coli - MIC*    AMPICILLIN >=32 RESISTANT Resistant     CEFAZOLIN 32 INTERMEDIATE Intermediate     CEFTRIAXONE <=1 SENSITIVE Sensitive     CIPROFLOXACIN >=4 RESISTANT Resistant     GENTAMICIN <=1 SENSITIVE Sensitive     IMIPENEM <=0.25 SENSITIVE Sensitive     NITROFURANTOIN 64 INTERMEDIATE Intermediate     TRIMETH/SULFA >=320 RESISTANT Resistant     AMPICILLIN/SULBACTAM >=32 RESISTANT Resistant     PIP/TAZO 64 INTERMEDIATE Intermediate     Extended ESBL NEGATIVE Sensitive     * >=100,000 COLONIES/mL ESCHERICHIA COLI  Blood Culture (routine x 2)     Status: None (Preliminary result)   Collection Time: 10/19/16  2:04 PM  Result Value Ref Range Status   Specimen Description BLOOD RIGHT THUMB  Final   Special Requests BOTTLES DRAWN AEROBIC ONLY 4CC  Final   Culture NO GROWTH 3 DAYS  Final   Report Status PENDING  Incomplete  Blood Culture (routine x 2)     Status: None (Preliminary result)   Collection Time: 10/19/16  2:17 PM  Result Value Ref Range Status   Specimen Description BLOOD RIGHT HAND  Final   Special Requests BOTTLES DRAWN AEROBIC ONLY 4CC  Final   Culture NO GROWTH 3 DAYS   Final   Report Status PENDING  Incomplete  MRSA PCR Screening     Status: None   Collection Time: 10/19/16  6:02 PM  Result Value Ref Range Status   MRSA by PCR NEGATIVE NEGATIVE Final    Comment:        The GeneXpert MRSA Assay (FDA approved for NASAL specimens only), is one component of a comprehensive MRSA colonization surveillance program. It is not intended to diagnose MRSA infection nor to guide or monitor treatment for MRSA infections.     Radiology Reports US Venous Img Lower Unilateral Left  Result Date: 09/29/2016 CLINICAL DATA:  Left lower extremity pain and edema following fall. Evaluate for DVT. EXAM: LEFT LOWER EXTREMITY VENOUS DOPPLER ULTRASOUND TECHNIQUE: Gray-scale sonography with graded compression, as well as color Doppler and duplex ultrasound were performed to evaluate the lower extremity deep venous systems from the level of the common femoral vein and including the common femoral, femoral, profunda femoral, popliteal and calf veins including the posterior tibial, peroneal and gastrocnemius veins when visible. The superficial great saphenous vein was also interrogated. Spectral Doppler was utilized to evaluate flow at rest and with distal augmentation maneuvers in the common femoral, femoral and popliteal veins. COMPARISON:  Left lower extremity venous Doppler ultrasound - 08/04/2016 ; 07/12/2014 FINDINGS: Examination is degraded due to patient body habitus and poor sonographic window. Contralateral Common Femoral Vein: Respiratory phasicity is normal and symmetric with the symptomatic side. No evidence of thrombus. Normal compressibility. Common Femoral Vein: No evidence of thrombus. Normal compressibility, respiratory phasicity and response to augmentation. Saphenofemoral Junction: No evidence of thrombus. Normal compressibility and flow on color Doppler imaging. Profunda Femoral Vein: No evidence of thrombus. Normal compressibility and flow on color Doppler imaging.  Femoral Vein: No evidence of thrombus. Normal compressibility, respiratory phasicity and response to augmentation. Popliteal Vein: No evidence of thrombus. Normal compressibility, respiratory phasicity and response to augmentation. Calf Veins: Appear patent where imaged. Superficial Great Saphenous Vein: No evidence of thrombus. Normal compressibility and flow  on color Doppler imaging. Venous Reflux:  None. Other Findings:  None. IMPRESSION: No evidence of DVT within the left lower extremity. Electronically Signed   By: Sandi Mariscal M.D.   On: 09/29/2016 09:47   Dg Chest Port 1 View  Result Date: 10/19/2016 CLINICAL DATA:  Fever and altered mental status EXAM: PORTABLE CHEST 1 VIEW COMPARISON:  September 28, 2016 FINDINGS: Cardiomegaly. Poor evaluation of the left retrocardiac region on the frontal view. No interval changes or acute abnormalities are seen. IMPRESSION: No acute abnormalities seen on this limited portable view. Electronically Signed   By: Dorise Bullion III M.D   On: 10/19/2016 15:10   Dg Chest Portable 1 View  Result Date: 09/28/2016 CLINICAL DATA:  Cough, wheezing and weakness for 1 week. EXAM: PORTABLE CHEST 1 VIEW COMPARISON:  08/02/2016 FINDINGS: Enlargement of the cardiac silhouette is grossly stable. Few densities in the right lower lung may be related to atelectasis. Upper lungs are clear. Again noted are postsurgical changes in the right humerus with a fractured surgical plate. Patient is rotated towards the left. IMPRESSION: Stable cardiomegaly without acute findings. Electronically Signed   By: Markus Daft M.D.   On: 09/28/2016 10:53    Time Spent in minutes  25   Louellen Molder M.D on 10/22/2016 at 3:56 PM  Between 7am to 7pm - Pager - [phone]  After 7pm go to [email or web address] - password [facility]  [facility] -  Office  [phone]

## 2016-10-22 NOTE — Care Management Note (Signed)
Case Management Note  Patient Details  Name: [name] [name] MRN: [id number] Date of Birth: [birth year]  Subjective/Objective:                  Pt admitted with sepsis. She is from home, lives with her husband and is total care. She has aid from [facility]. PT has recommended SNF and husband is in agreement. CSW is aware and has made arrangements for STR.   Action/Plan: Anticipate DC to SNF tomorrow. No CM needs anticipated.   Expected Discharge Date:  10/22/16               Expected Discharge Plan:  [place]  In-House Referral:  Clinical Social Work  Discharge planning Services  CM Consult  Post Acute Care Choice:  Home Health Choice offered to:  NA  Status of Service:  Completed, signed off  Sherald Barge, RN 10/22/2016, 3:57 PM

## 2016-10-22 NOTE — Evaluation (Signed)
Clinical/Bedside Swallow Evaluation Patient Details  Name: [name] [name] MRN: [id number] Date of Birth: Aug 20, 1941  Today's Date: 10/22/2016 Time: SLP Start Time (ACUTE ONLY): 1316 SLP Stop Time (ACUTE ONLY): 1342 SLP Time Calculation (min) (ACUTE ONLY): 26 min  Past Medical History:  Past Medical History:  Diagnosis Date  . Abnormal EKG    hx of left anterior fasicular block on 04-09-13 ekg epic  . Allergic rhinitis, cause unspecified   . Bilateral leg weakness   . CAD in native artery   . Chronic gastritis 03/09/11   egd by Dr. Gala Romney  . Chronic shoulder pain   . Cirrhosis ([place])   . Cirrhosis of liver (HCC)    NASH, afp on 06/17/12 =3.7, per pt she had hep B vaccines in 1996, hep A in process.   . Colon adenomas 03/08/10   tcs by Dr. Gala Romney  . Diverticula of colon 03/08/10   L side  . Esophagitis, erosive 03/08/10  . GERD (gastroesophageal reflux disease)   . History of hemorrhoids 03/08/10   tcs- internal and external  . Hyperplastic polyps of stomach 03/08/10   tcs by Dr. Dudley Major  . Intrinsic asthma, unspecified   . Obesity, unspecified   . Other and unspecified hyperlipidemia   . Pancreatitis   . Pancytopenia, acquired ([place]) 04/12/2014  . Personal history of neurosis   . Thrombocytopenia ([place]) 04/12/2014  . Thrombocytopenia, unspecified   . Type II or unspecified type diabetes mellitus without mention of complication, not stated as uncontrolled   . Unspecified essential hypertension   . Unspecified fall   . Unspecified hereditary and idiopathic peripheral neuropathy   . Vertigo   . Wears glasses    Past Surgical History:  Past Surgical History:  Procedure Laterality Date  . ABDOMINAL HYSTERECTOMY    . BREAST SURGERY Right few yrs ago   benign area removed  . CARDIAC CATHETERIZATION  02/25/2007   Dr. [name]:  normal LV systolic function, mild irregularities of LAD  . COLONOSCOPY  03/08/10   Dr. [name]-->ext/int hemorrhoids, anal paipilla, rectal polyps, desc polyps, cecal  polyp, left-sided diverticula. suboptiomal prep. next TCS 02/2015. multiple adenomas  . ESOPHAGOGASTRODUODENOSCOPY  03/08/10   Dr. [name] erosive RE, small hh, antral erosions, small 1cm are of mucosal indentation along gastric body of doubtful significance, cystic nodularity of hypophyarynx, base of tongue, base of epiglottis. benign gastric biopsies  . ESOPHAGOGASTRODUODENOSCOPY  10/08/2011   Dr. [name] esophageal varices, antral erosion. next egd 09/2013  . ESOPHAGOGASTRODUODENOSCOPY N/A 02/17/2014   Dr. Gala Romney: portal gastropathy, no varices. Due for surveillance 2017  . EYE SURGERY     cataracts bilateral  . FOOT SURGERY  2003   lt foot  . hysterectomy and btl     s/p  . ORIF HUMERUS FRACTURE Right 02/17/2013   Procedure: OPEN REDUCTION INTERNAL FIXATION (ORIF) RIGHT PROXIMAL HUMERUS FRACTURE;  Surgeon: [name], MD;  Location: [facility];  Service: Orthopedics;  Laterality: Right;  . PARTIAL GASTRECTOMY     family denies  . TUBAL LIGATION    . TUMOR EXCISION  2003   rt arm and left foot   HPI:  [name] [name] is a 75 y.o. female with a history of dementia, [name] cirrhosis, coronary artery disease, GERD, obesity, type 2 diabetes, hypertension. Patient presents with 5 days of gradual decline and weakness, resulting in fever today. The patient was seen by her primary care physician 2 days ago and was diagnosed with UTI, however she was not started on  antibiotics. Her PCP did instruct her to come to the emergency department as he thought she needed to be admitted, however the patient did not come. Patient had fevers today and is slightly confused per her husband. Her appetite has decreased and she has become more week. Patient was admitted back in mid November with a UTI. Her husband says that this has never fully gone away. No palliating or provoking factors. BSE ordered due to reports of coughing after eating/drinking. Chest x-ray essentially WNL with no acute abnormalities.    Assessment / Plan / Recommendation Clinical Impression  Pt seen at bedside for clinical swallow evaluation with spouse present. Pt denies difficulty swallowing, however her spouse does indicate frequent coughing after meals (this has been going on for several months per spouse). Oral motor evaluation reveals no gross asymmetry or weakness. Pt with own dentition, but sparse. Pt is dependent on others for feeding/drinking. Pt assessed with ice chips, thin, puree, and regular textures. Pt with occasional immediate coughing after thin liquids and spouse reports this is consistent with his report at home. Although chest x-ray was clear, recommend MBSS to help identify appropriate diet, compensatory strategies, and risks for aspiration. Can complete MBSS tomorrow AM (Tuesday). Continue diet as ordered for now given several month history of coughing per spouse and no acute changes on chest x-ray. Pt/spouse in agreement with plan of care.     Aspiration Risk  Moderate aspiration risk    Diet Recommendation Dysphagia 3 (Mech soft);Thin liquid   Liquid Administration via: Straw;Cup Medication Administration: Whole meds with liquid Supervision: Staff to assist with self feeding;Full supervision/cueing for compensatory strategies Compensations: Small sips/bites;Use straw to facilitate chin tuck Postural Changes: Seated upright at 90 degrees;Remain upright for at least 30 minutes after po intake    Other  Recommendations Oral Care Recommendations: Oral care BID;Staff/trained caregiver to provide oral care Other Recommendations: Clarify dietary restrictions   Follow up Recommendations  (pending)      Frequency and Duration min 2x/week  1 week       Prognosis Prognosis for Safe Diet Advancement: [place] Date of Onset: 10/19/16 HPI: [name] [name] is a 75 y.o. female with a history of dementia, [name] cirrhosis, coronary artery disease, GERD, obesity, type 2 diabetes,  hypertension. Patient presents with 5 days of gradual decline and weakness, resulting in fever today. The patient was seen by her primary care physician 2 days ago and was diagnosed with UTI, however she was not started on antibiotics. Her PCP did instruct her to come to the emergency department as he thought she needed to be admitted, however the patient did not come. Patient had fevers today and is slightly confused per her husband. Her appetite has decreased and she has become more week. Patient was admitted back in mid November with a UTI. Her husband says that this has never fully gone away. No palliating or provoking factors. BSE ordered due to reports of coughing after eating/drinking. Chest x-ray essentially WNL with no acute abnormalities. Type of Study: Bedside Swallow Evaluation Previous Swallow Assessment: none on record Diet Prior to this Study: Regular;Thin liquids Temperature Spikes Noted: No Respiratory Status: Room air History of Recent Intubation: No Behavior/Cognition: Alert;Cooperative;Pleasant mood Oral Cavity Assessment: Within Functional Limits Oral Care Completed by SLP: Yes Oral Cavity - Dentition: Poor condition Vision: Impaired for self-feeding Self-Feeding Abilities: Total assist Patient Positioning: Upright in bed Baseline Vocal Quality: Normal Volitional Cough: Strong;Congested Volitional Swallow:  Able to elicit    Oral/Motor/Sensory Function Overall Oral Motor/Sensory Function: Within functional limits   Ice Chips Ice chips: Within functional limits   Thin Liquid Thin Liquid: Impaired Presentation: Cup;Straw Pharyngeal  Phase Impairments: Suspected delayed Swallow;Cough - Immediate    Nectar Thick Nectar Thick Liquid: Not tested   Honey Thick Honey Thick Liquid: Not tested   Puree Puree: Within functional limits Presentation: Spoon   Solid   Thank you,  Genene Churn, CCC-SLP [phone]    Solid: Within functional limits         Dorri Ozturk 10/22/2016,1:47 PM

## 2016-10-22 NOTE — Clinical Social Work Placement (Signed)
   CLINICAL SOCIAL WORK PLACEMENT  NOTE  Date:  10/22/2016  Patient Details  Name: [name] [name] MRN: [id number] Date of Birth: 08/24/1941  Clinical Social Work is seeking post-discharge placement for this patient at the [place] level of care (*CSW will initial, date and re-position this form in  chart as items are completed):  Yes   Patient/family provided with [facility] Work Department's list of facilities offering this level of care within the geographic area requested by the patient (or if unable, by the patient's family).  Yes   Patient/family informed of their freedom to choose among providers that offer the needed level of care, that participate in Medicare, Medicaid or managed care program needed by the patient, have an available bed and are willing to accept the patient.  Yes   Patient/family informed of [facility]'s ownership interest in [facility] and [facility], as well as of the fact that they are under no obligation to receive care at these facilities.  PASRR submitted to EDS on       PASRR number received on       Existing PASRR number confirmed on 10/22/16     FL2 transmitted to all facilities in geographic area requested by pt/family on 10/22/16     FL2 transmitted to all facilities within larger geographic area on       Patient informed that his/her managed care company has contracts with or will negotiate with certain facilities, including the following:        Yes   Patient/family informed of bed offers received.  Patient chooses bed at [facility]     Physician recommends and patient chooses bed at      Patient to be transferred to [facility] on  .  Patient to be transferred to facility by       Patient family notified on   of transfer.  Name of family member notified:        PHYSICIAN       Additional Comment:    _______________________________________________ Salome Arnt, LCSW 10/22/2016, 2:45 PM [phone]

## 2016-10-23 ENCOUNTER — Inpatient Hospital Stay (HOSPITAL_COMMUNITY): Payer: Medicare Other

## 2016-10-23 DIAGNOSIS — K7581 Nonalcoholic steatohepatitis (NASH): Secondary | ICD-10-CM

## 2016-10-23 DIAGNOSIS — R6520 Severe sepsis without septic shock: Secondary | ICD-10-CM

## 2016-10-23 DIAGNOSIS — E872 Acidosis: Secondary | ICD-10-CM

## 2016-10-23 DIAGNOSIS — B9620 Unspecified Escherichia coli [E. coli] as the cause of diseases classified elsewhere: Secondary | ICD-10-CM

## 2016-10-23 DIAGNOSIS — K7460 Unspecified cirrhosis of liver: Secondary | ICD-10-CM

## 2016-10-23 DIAGNOSIS — N390 Urinary tract infection, site not specified: Secondary | ICD-10-CM | POA: Diagnosis present

## 2016-10-23 DIAGNOSIS — A419 Sepsis, unspecified organism: Principal | ICD-10-CM

## 2016-10-23 DIAGNOSIS — G9340 Encephalopathy, unspecified: Secondary | ICD-10-CM

## 2016-10-23 DIAGNOSIS — M6281 Muscle weakness (generalized): Secondary | ICD-10-CM

## 2016-10-23 LAB — GLUCOSE, CAPILLARY
GLUCOSE-CAPILLARY: 186 mg/dL — AB (ref 65–99)
GLUCOSE-CAPILLARY: 354 mg/dL — AB (ref 65–99)

## 2016-10-23 MED ORDER — INSULIN ASPART 100 UNIT/ML ~~LOC~~ SOLN
6.0000 [IU] | Freq: Three times a day (TID) | SUBCUTANEOUS | Status: DC
  Administered 2016-10-23: 6 [IU] via SUBCUTANEOUS

## 2016-10-23 MED ORDER — INSULIN GLARGINE 100 UNIT/ML ~~LOC~~ SOLN
28.0000 [IU] | Freq: Every day | SUBCUTANEOUS | Status: DC
  Filled 2016-10-23: qty 0.28

## 2016-10-23 MED ORDER — CEFPODOXIME PROXETIL 100 MG PO TABS: 100.0000 mg | ORAL_TABLET | Freq: Two times a day (BID) | ORAL | 0 refills | Status: DC

## 2016-10-23 MED ORDER — FUROSEMIDE 20 MG PO TABS: 40.0000 mg | ORAL_TABLET | Freq: Every day | ORAL | 0 refills | Status: DC

## 2016-10-23 MED ORDER — CEFPODOXIME PROXETIL 100 MG PO TABS: 100.0000 mg | ORAL_TABLET | Freq: Two times a day (BID) | ORAL | 0 refills | Status: AC

## 2016-10-23 NOTE — Progress Notes (Signed)
Patient discharged home.  IV and foley removed - WNL.  Reviewed DC instructions and medications with patients husband - verbalizes understanding.  Follow up in place. EMS in transport for pick up.  Patient in NAD at this time.

## 2016-10-23 NOTE — Procedures (Signed)
Objective Swallowing Evaluation: Type of Study: MBS-Modified Barium Swallow Study  Patient Details  Name: MOLLI GAFFKE MRN: [id number] Date of Birth: 1941/07/30  Today's Date: 10/23/2016 Time: SLP Start Time (ACUTE ONLY): 0945-SLP Stop Time (ACUTE ONLY): 1030 SLP Time Calculation (min) (ACUTE ONLY): 45 min  Past Medical History:  Past Medical History:  Diagnosis Date  . Abnormal EKG    hx of left anterior fasicular block on 04-09-13 ekg epic  . Allergic rhinitis, cause unspecified   . Bilateral leg weakness   . CAD in native artery   . Chronic gastritis 03/09/11   egd by Dr. Gala Romney  . Chronic shoulder pain   . Cirrhosis ([place])   . Cirrhosis of liver (HCC)    NASH, afp on 06/17/12 =3.7, per pt she had hep B vaccines in 1996, hep A in process.   . Colon adenomas 03/08/10   tcs by Dr. Gala Romney  . Diverticula of colon 03/08/10   L side  . Esophagitis, erosive 03/08/10  . GERD (gastroesophageal reflux disease)   . History of hemorrhoids 03/08/10   tcs- internal and external  . Hyperplastic polyps of stomach 03/08/10   tcs by Dr. Dudley Major  . Intrinsic asthma, unspecified   . Obesity, unspecified   . Other and unspecified hyperlipidemia   . Pancreatitis   . Pancytopenia, acquired ([place]) 04/12/2014  . Personal history of neurosis   . Thrombocytopenia ([place]) 04/12/2014  . Thrombocytopenia, unspecified   . Type II or unspecified type diabetes mellitus without mention of complication, not stated as uncontrolled   . Unspecified essential hypertension   . Unspecified fall   . Unspecified hereditary and idiopathic peripheral neuropathy   . Vertigo   . Wears glasses    Past Surgical History:  Past Surgical History:  Procedure Laterality Date  . ABDOMINAL HYSTERECTOMY    . BREAST SURGERY Right few yrs ago   benign area removed  . CARDIAC CATHETERIZATION  02/25/2007   Dr. [name]:  normal LV systolic function, mild irregularities of LAD  . COLONOSCOPY  03/08/10   Dr. [name]-->ext/int hemorrhoids, anal  paipilla, rectal polyps, desc polyps, cecal polyp, left-sided diverticula. suboptiomal prep. next TCS 02/2015. multiple adenomas  . ESOPHAGOGASTRODUODENOSCOPY  03/08/10   Dr. [name] erosive RE, small hh, antral erosions, small 1cm are of mucosal indentation along gastric body of doubtful significance, cystic nodularity of hypophyarynx, base of tongue, base of epiglottis. benign gastric biopsies  . ESOPHAGOGASTRODUODENOSCOPY  10/08/2011   Dr. [name] esophageal varices, antral erosion. next egd 09/2013  . ESOPHAGOGASTRODUODENOSCOPY N/A 02/17/2014   Dr. Gala Romney: portal gastropathy, no varices. Due for surveillance 2017  . EYE SURGERY     cataracts bilateral  . FOOT SURGERY  2003   lt foot  . hysterectomy and btl     s/p  . ORIF HUMERUS FRACTURE Right 02/17/2013   Procedure: OPEN REDUCTION INTERNAL FIXATION (ORIF) RIGHT PROXIMAL HUMERUS FRACTURE;  Surgeon: [name], MD;  Location: [facility];  Service: Orthopedics;  Laterality: Right;  . PARTIAL GASTRECTOMY     family denies  . TUBAL LIGATION    . TUMOR EXCISION  2003   rt arm and left foot   HPI: BRIA VEENEMAN is a 75 y.o. female with a history of dementia, [name] cirrhosis, coronary artery disease, GERD, obesity, type 2 diabetes, hypertension. Patient presents with 5 days of gradual decline and weakness, resulting in fever today. The patient was seen by her primary care physician 2 days ago and was diagnosed with UTI,  however she was not started on antibiotics. Her PCP did instruct her to come to the emergency department as he thought she needed to be admitted, however the patient did not come. Patient had fevers today and is slightly confused per her husband. Her appetite has decreased and she has become more week. Patient was admitted back in mid November with a UTI. Her husband says that this has never fully gone away. No palliating or provoking factors. BSE ordered due to reports of coughing after eating/drinking. Chest x-ray essentially WNL  with no acute abnormalities.  Subjective: "I am doing better."   Assessment / Plan / Recommendation  CHL IP CLINICAL IMPRESSIONS 10/23/2016  Therapy Diagnosis Moderate pharyngeal phase dysphagia  Clinical Impression Pt seen in hausted chair for MBSS in lateral position. Pt assessed with puree, nectar-thick liquids (NTL), thin, regular textures, and barium tablet with thin liquids. Pt presents with moderate pharyngeal phase dysphagia characterized by mild decreased hyolaryngeal excursion and decreased laryngeal closure resulting in penetration/aspiration during the swallow with nectar-thick liquids and thin liquids down posterior tracheal wall (likely through arytenoids). Pt generally able to sense and was able to cough and clear and repeat swallow. Pt had more difficulty clearing the aspirated NTL. Pt with mild pyriform residue with liquids and mild amount with puree. Suspect aspiration occurring due to weakness on left side. Chin tuck, head turn to left, and controlled bolus presentation implemented to attempt to mitigate aspiration, however none completely effective. Pt is dependent upon others for feeding and only drinks liquids via large bore straw cup at home. Pt did best with 1/4 tsp presentation thin, however this is unlikely to be carried out at home. I reviewed the swallow study with pt and spouse and suggested that pt switch to a smaller straw and focus on taking very small sips, hold in mouth (she is able to do this), swallow, cough/throat clear, and repeat/dry swallow with each sip of liquid. Family reports that coughing episodes have been going on for months and pt has not had PNA. Recommend continuing regular textures and thin liquids with above stated strategies, po meds whole in puree, and encourage good oral care. Recommend HH SLP for dysphagia intervention to focus on vocal fold adduction, breath hold and swallow, generate cough, and above strategies. Monitor lungs and temps.  Impact on  safety and function Moderate aspiration risk      CHL IP TREATMENT RECOMMENDATION 10/23/2016  Treatment Recommendations (No Data)     Prognosis 10/23/2016  Prognosis for Safe Diet Advancement [place]  Barriers to Reach Goals Severity of deficits  Barriers/Prognosis Comment --    CHL IP DIET RECOMMENDATION 10/23/2016  SLP Diet Recommendations Regular solids;Thin liquid  Liquid Administration via Straw  Medication Administration Whole meds with puree  Compensations Small sips/bites;Clear throat intermittently  Postural Changes Remain semi-upright after after feeds/meals (Comment);Seated upright at 90 degrees      CHL IP OTHER RECOMMENDATIONS 10/23/2016  Recommended Consults --  Oral Care Recommendations Oral care BID;Staff/trained caregiver to provide oral care  Other Recommendations Clarify dietary restrictions      CHL IP FOLLOW UP RECOMMENDATIONS 10/23/2016  Follow up Recommendations Home health SLP      CHL IP FREQUENCY AND DURATION 10/22/2016  Speech Therapy Frequency (ACUTE ONLY) min 2x/week  Treatment Duration 1 week           CHL IP ORAL PHASE 10/23/2016  Oral Phase WFL  Oral - Pudding Teaspoon --  Oral - Pudding Cup --  Oral - Honey  Teaspoon --  Oral - Honey Cup --  Oral - Nectar Teaspoon --  Oral - Nectar Cup --  Oral - Nectar Straw --  Oral - Thin Teaspoon --  Oral - Thin Cup --  Oral - Thin Straw --  Oral - Puree --  Oral - Mech Soft --  Oral - Regular --  Oral - Multi-Consistency --  Oral - Pill --  Oral Phase - Comment --    CHL IP PHARYNGEAL PHASE 10/23/2016  Pharyngeal Phase Impaired  Pharyngeal- Pudding Teaspoon --  Pharyngeal --  Pharyngeal- Pudding Cup --  Pharyngeal --  Pharyngeal- Honey Teaspoon --  Pharyngeal --  Pharyngeal- Honey Cup --  Pharyngeal --  Pharyngeal- Nectar Teaspoon --  Pharyngeal --  Pharyngeal- Nectar Cup --  Pharyngeal --  Pharyngeal- Nectar Straw Delayed swallow initiation-vallecula;Reduced airway/laryngeal  closure;Penetration/Aspiration during swallow;Penetration/Apiration after swallow;Trace aspiration;Pharyngeal residue - pyriform;Lateral channel residue  Pharyngeal Material enters airway, passes BELOW cords then ejected out;Material enters airway, passes BELOW cords and not ejected out despite cough attempt by patient  Pharyngeal- Thin Teaspoon --  Pharyngeal --  Pharyngeal- Thin Cup --  Pharyngeal --  Pharyngeal- Thin Straw Delayed swallow initiation-vallecula;Reduced airway/laryngeal closure;Penetration/Aspiration during swallow;Penetration/Apiration after swallow;Trace aspiration;Pharyngeal residue - pyriform;Inter-arytenoid space residue  Pharyngeal Material enters airway, passes BELOW cords then ejected out  Pharyngeal- Puree Pharyngeal residue - pyriform  Pharyngeal --  Pharyngeal- Mechanical Soft --  Pharyngeal --  Pharyngeal- Regular WFL  Pharyngeal --  Pharyngeal- Multi-consistency --  Pharyngeal --  Pharyngeal- Pill (No Data)  Pharyngeal --  Pharyngeal Comment --     CHL IP CERVICAL ESOPHAGEAL PHASE 10/23/2016  Cervical Esophageal Phase WFL  Pudding Teaspoon --  Pudding Cup --  Honey Teaspoon --  Honey Cup --  Nectar Teaspoon --  Nectar Cup --  Nectar Straw --  Thin Teaspoon --  Thin Cup --  Thin Straw --  Puree --  Mechanical Soft --  Regular --  Multi-consistency --  Pill --  Cervical Esophageal Comment --   Thank you,  Genene Churn, [place]  No flowsheet data found.  PORTER,DABNEY 10/23/2016, 12:57 PM

## 2016-10-23 NOTE — Progress Notes (Signed)
Inpatient Diabetes Program Recommendations  AACE/ADA: New Consensus Statement on Inpatient Glycemic Control (2015)  Target Ranges:  Prepandial:   less than 140 mg/dL      Peak postprandial:   less than 180 mg/dL (1-2 hours)      Critically ill patients:  140 - 180 mg/dL   Results for LYRICAL, CHADDERDON (MRN ID:[id number]) as of 10/23/2016 08:08  Ref. Range 10/22/2016 08:01 10/22/2016 11:08 10/22/2016 16:38 10/22/2016 20:35  Glucose-Capillary Latest Ref Range: 65 - 99 mg/dL 139 (H) 216 (H) 221 (H) 221 (H)   Results for SHANNARA, NIGHT (MRN ID:[id number]) as of 10/23/2016 08:08  Ref. Range 10/23/2016 07:28  Glucose-Capillary Latest Ref Range: 65 - 99 mg/dL 186 (H)     Home DM Meds: Levemir 50 units QHS                              Humalog 10-16 units TID  Current Orders: Lantus 25 units QHS                            Novolog Moderate Correction Scale/ SSI (0-15 units) TID AC + HS      MD- Please consider the following in-hospital insulin adjustments:  1. Start Novolog Meal Coverage: Novolog 6 units TID with meals (hold if pt eats <50% of meal)  2. Increase Lantus slightly to 28 units QHS      --Will follow patient during hospitalization--  Wyn Quaker RN, MSN, CDE Diabetes Coordinator Inpatient Glycemic Control Team Team Pager: [phone] (8a-5p)

## 2016-10-23 NOTE — Care Management Note (Signed)
Case Management Note  Patient Details  Name: [name] [name] MRN: [id number] Date of Birth: 05/04/1941  Expected Discharge Date:  10/22/16               Expected Discharge Plan:  [place]  In-House Referral:  Clinical Social Work  Discharge planning Services  CM Consult  Post Acute Care Choice:  Home Health Choice offered to:  Spouse  DME Arranged:    DME Agency:     HH Arranged:  RN, PT, OT, Social Work, Nurse's Aide [place] Agency:  [place]  Status of Service:  Completed, signed off  Additional Comments: Pt's husband has decided to take pt home. Pt ordered [facility] services. Pt's husband has chosen [facility] from list of [facility] providers. Pt's husband aware that [facility] has 48hrs to make first visit. Romualdo Bolk, of [facility], aware of referral and will obtain pt info from chart. Pt discharging home today. Will need EMS transport which has been arranged by CM.   Sherald Barge, RN 10/23/2016, 2:43 PM

## 2016-10-23 NOTE — Clinical Social Work Note (Signed)
Pt's husband has decided to take pt home as they feel that pt will not be skilled very long and then would have to return home anyway. [place] notified. Will sign off.   [name], [place]

## 2016-10-23 NOTE — Discharge Summary (Addendum)
Physician Discharge Summary  [name] [name] [id number] DOB: 1940/12/21 DOA: 10/19/2016  PCP: Purvis Kilts, MD  Admit date: 10/19/2016 Discharge date: 10/23/2016  Admitted From: HOME Disposition:  Home with [facility]  Recommendations for Outpatient Follow-up:  1. Follow up with PCP in 1-2 weeks 2. She will complete a total 10 days course of antibiotics on 12/19  Home Health: RN and PT Equipment/Devices: None  Discharge Condition:fair CODE STATUS: Do not intubated Diet recommendation: Heart Healthy / Carb Modified     Discharge Diagnoses:  Principal Problem:   Severe sepsis ([place])   Active Problems:     Encephalopathy, metabolic/ hepatic ([place])   Lower urinary tract infectious disease   Diabetes mellitus with neuropathy (H[facility])   Lactic acidosis   E-coli UTI   CAD, NATIVE VESSEL   GERD   Liver cirrhosis secondary to NASH [facility])   Essential hypertension   Brief Narrative/history of present illness 75 year old obese female with NSH cirrhosis, coronary artery disease, dementia, GERD, type 2 diabetes mellitus, hypertension with 5 day history of increasing weakness with gradual decline in function. She had fever on the day of admission with increased confusion. She also had poor appetite. Her PCP saw her 2 days back and diagnosed her with UTI. She was not started on antibiotics. Patient found to be septic on admission with hypotension, and elevated lactic acid.. Patient admitted for further management.  Hospital course Principal Problem:   Severe sepsis ([place]) Secondary to Escherichia coli UTI. Sepsis now resolved. Urine culture growing Escherichia coli sensitive to ceftriaxone but resistant to penicillin, quinolone and Bactrim. Mental status now baseline per husband. Will transition to  [place] her with total 10 day course of antibiotics.   Active Problems: Acute encephalopathy LikelyIn edition of metabolic (secondary to hepatic encephalopathy) and septic  encephalopathy. .Continue lactulose and rifaximin. Mentation at baseline (has moderate dementia with fluctuating confusion.).  UTI Continue Rocephin. Transition to 4th gen cephalosporin on d/c  Essential hypertension Lasix and Aldactone were held due to hypotension and sepsis on admission. Now has increasing leg swelling. Improved with IV Lasix. Discharged on daily oral Lasix and resume Aldactone.  NASH cirrhosis Has chronic hyponatremia, thrombocytopenia. Received IV fluids and diuretics held due to hypotension. Now volume overloaded. Given IV Lasix and will discharge on daily Lasix 40 mg. Resume Aldactone. Continue lactulose and rifaximin.   Diabetes mellitus type 2, insulin-dependent Resume home dose insulin. CBG stable while in the hospital.  Moderate aspiration risk Patient coughing after meals. Seen by SLP and recommend  the following: Regular solids;Thin liquid   Liquid Administration via Straw  Medication Administration Whole meds with puree  Compensations Small sips/bites;Clear throat intermittently  Postural Changes Remain semi-upright after after feeds/meals (Comment);Seated upright at 90 degrees    Patient was offered SNF at [facility] but husband informed that he was called from SNF that did not keep her there for very long since she would have difficulty participating with PT. So he decided to take her home with home health.  Code Status :  Do Not intubated  Family Communication  :  Husband at bedside  Disposition Plan  :  Home with home health   Consults  :  None  Procedures  :  None   Discharge Instructions     Medication List    STOP taking these medications   cefUROXime 250 MG tablet Commonly known as:  CEFTIN   oxyCODONE 5 MG immediate release tablet Commonly known as:  Oxy IR/ROXICODONE  TAKE these medications   ALPRAZolam 0.5 MG tablet Commonly known as:  XANAX Take 1 tablet (0.5 mg total) by mouth 2 (two) times daily.    aspirin EC 81 MG tablet Take 81 mg by mouth daily.   budesonide-formoterol 160-4.5 MCG/ACT inhaler Commonly known as:  SYMBICORT Inhale 2 puffs into the lungs at bedtime.   cefpodoxime 100 MG tablet Commonly known as:  VANTIN Take 1 tablet (100 mg total) by mouth 2 (two) times daily.   diphenhydrAMINE 25 MG tablet Commonly known as:  BENADRYL Take 25 mg by mouth every 6 (six) hours as needed.   enalapril 5 MG tablet Commonly known as:  VASOTEC Take 1 tablet by mouth daily.   fluconazole 150 MG tablet Commonly known as:  DIFLUCAN Take 150 mg by mouth as directed. Every 4 days   furosemide 20 MG tablet Commonly known as:  LASIX Take 2 tablets (40 mg total) by mouth daily. What changed:  how much to take  when to take this  reasons to take this   HUMALOG KWIKPEN 100 UNIT/ML KiwkPen Generic drug:  insulin lispro INJECT 10-16 UNITS INTO THE SKIN THREE TIMES DAILY   HUMALOG KWIKPEN 100 UNIT/ML KiwkPen Generic drug:  insulin lispro INJECT 10-16 UNITS INTO THE SKIN THREE TIMES DAILY   lactulose 10 GM/15ML solution Commonly known as:  CHRONULAC Take 20 g by mouth 3 (three) times daily.   LEVEMIR FLEXTOUCH 100 UNIT/ML Pen Generic drug:  Insulin Detemir INJECT 50 UNITS INTO THE SKIN DAILY AT 10PM   meclizine 25 MG tablet Commonly known as:  ANTIVERT Take 25 mg by mouth 3 (three) times daily as needed for dizziness.   NEXIUM 40 MG capsule Generic drug:  esomeprazole Take 40 mg by mouth every morning.   ONETOUCH VERIO test strip Generic drug:  glucose blood USE UP TO FOUR TIMES DAILY AS DIRECTED   pregabalin 100 MG capsule Commonly known as:  LYRICA Take 100 mg by mouth 2 (two) times daily.   rifaximin 550 MG Tabs tablet Commonly known as:  XIFAXAN Take 1 tablet (550 mg total) by mouth 2 (two) times daily.   spironolactone 50 MG tablet Commonly known as:  ALDACTONE TAKE ONE TABLET BY MOUTH ONCE DAILY IN THE MORNING   triamcinolone cream 0.1 % Commonly  known as:  KENALOG Apply 1 application topically daily as needed (for irritation).   URSO FORTE 500 MG tablet Generic drug:  ursodiol Take 500 mg by mouth 2 (two) times daily.       Contact information for follow-up providers    Purvis Kilts, MD. Schedule an appointment as soon as possible for a visit in 1 week(s).   Specialty:  Family Medicine Contact information: [address] [place] [id number] [phone]            Contact information for after-discharge care    [facility] .   Contact information: [address] [place] [phone]                 Allergies  Allergen Reactions  . Morphine And Related Itching  . Compazine [Prochlorperazine Edisylate] Anxiety    Causes anxiety & nervousness      Procedures/Studies: US Venous Img Lower Unilateral Left  Result Date: 09/29/2016 CLINICAL DATA:  Left lower extremity pain and edema following fall. Evaluate for DVT. EXAM: LEFT LOWER EXTREMITY VENOUS DOPPLER ULTRASOUND TECHNIQUE: Gray-scale sonography with graded compression, as well as  color Doppler and duplex ultrasound were performed to evaluate the lower extremity deep venous systems from the level of the common femoral vein and including the common femoral, femoral, profunda femoral, popliteal and calf veins including the posterior tibial, peroneal and gastrocnemius veins when visible. The superficial great saphenous vein was also interrogated. Spectral Doppler was utilized to evaluate flow at rest and with distal augmentation maneuvers in the common femoral, femoral and popliteal veins. COMPARISON:  Left lower extremity venous Doppler ultrasound - 08/04/2016 ; 07/12/2014 FINDINGS: Examination is degraded due to patient body habitus and poor sonographic window. Contralateral Common Femoral Vein: Respiratory phasicity is normal and symmetric with the symptomatic side. No evidence of  thrombus. Normal compressibility. Common Femoral Vein: No evidence of thrombus. Normal compressibility, respiratory phasicity and response to augmentation. Saphenofemoral Junction: No evidence of thrombus. Normal compressibility and flow on color Doppler imaging. Profunda Femoral Vein: No evidence of thrombus. Normal compressibility and flow on color Doppler imaging. Femoral Vein: No evidence of thrombus. Normal compressibility, respiratory phasicity and response to augmentation. Popliteal Vein: No evidence of thrombus. Normal compressibility, respiratory phasicity and response to augmentation. Calf Veins: Appear patent where imaged. Superficial Great Saphenous Vein: No evidence of thrombus. Normal compressibility and flow on color Doppler imaging. Venous Reflux:  None. Other Findings:  None. IMPRESSION: No evidence of DVT within the left lower extremity. Electronically Signed   By: Sandi Mariscal M.D.   On: 09/29/2016 09:47   Dg Chest Port 1 View  Result Date: 10/19/2016 CLINICAL DATA:  Fever and altered mental status EXAM: PORTABLE CHEST 1 VIEW COMPARISON:  September 28, 2016 FINDINGS: Cardiomegaly. Poor evaluation of the left retrocardiac region on the frontal view. No interval changes or acute abnormalities are seen. IMPRESSION: No acute abnormalities seen on this limited portable view. Electronically Signed   By: Dorise Bullion III M.D   On: 10/19/2016 15:10   Dg Chest Portable 1 View  Result Date: 09/28/2016 CLINICAL DATA:  Cough, wheezing and weakness for 1 week. EXAM: PORTABLE CHEST 1 VIEW COMPARISON:  08/02/2016 FINDINGS: Enlargement of the cardiac silhouette is grossly stable. Few densities in the right lower lung may be related to atelectasis. Upper lungs are clear. Again noted are postsurgical changes in the right humerus with a fractured surgical plate. Patient is rotated towards the left. IMPRESSION: Stable cardiomegaly without acute findings. Electronically Signed   By: Markus Daft M.D.   On:  09/28/2016 10:53     Subjective: leg Swelling much improved. Mentation better today.  Discharge Exam: Vitals:   10/22/16 2033 10/23/16 0624  BP: 130/61 122/74  Pulse: 77 68  Resp: 18 18  Temp: 98.4 F (36.9 C) 98.4 F (36.9 C)   Vitals:   10/22/16 1944 10/22/16 2033 10/23/16 0624 10/23/16 0859  BP:  130/61 122/74   Pulse:  77 68   Resp:  18 18   Temp:  98.4 F (36.9 C) 98.4 F (36.9 C)   TempSrc:  Oral Oral   SpO2: 97% 98% 98% 96%  Weight:      Height:         Gen: not in distress HEENT:  moist mucosa, supple neck Chest: clear b/l, no added sounds CVS: N S1&S2, no murmurs, GI: soft, NT, ND, BS+ Musculoskeletal: warm,  trace pitting edema bilaterally CNS: AAOx2, fine tremors   The results of significant diagnostics from this hospitalization (including imaging, microbiology, ancillary and laboratory) are listed below for reference.     Microbiology: Recent Results (from the past  240 hour(s))  Urine culture     Status: Abnormal   Collection Time: 10/19/16  1:32 PM  Result Value Ref Range Status   Specimen Description URINE, CATHETERIZED  Final   Special Requests NONE  Final   Culture >=100,000 COLONIES/mL ESCHERICHIA COLI (A)  Final   Report Status 10/22/2016 FINAL  Final   Organism ID, Bacteria ESCHERICHIA COLI (A)  Final      Susceptibility   Escherichia coli - MIC*    AMPICILLIN >=32 RESISTANT Resistant     CEFAZOLIN 32 INTERMEDIATE Intermediate     CEFTRIAXONE <=1 SENSITIVE Sensitive     CIPROFLOXACIN >=4 RESISTANT Resistant     GENTAMICIN <=1 SENSITIVE Sensitive     IMIPENEM <=0.25 SENSITIVE Sensitive     NITROFURANTOIN 64 INTERMEDIATE Intermediate     TRIMETH/SULFA >=320 RESISTANT Resistant     AMPICILLIN/SULBACTAM >=32 RESISTANT Resistant     PIP/TAZO 64 INTERMEDIATE Intermediate     Extended ESBL NEGATIVE Sensitive     * >=100,000 COLONIES/mL ESCHERICHIA COLI  Blood Culture (routine x 2)     Status: None (Preliminary result)   Collection  Time: 10/19/16  2:04 PM  Result Value Ref Range Status   Specimen Description BLOOD RIGHT THUMB  Final   Special Requests BOTTLES DRAWN AEROBIC ONLY 4CC  Final   Culture NO GROWTH 3 DAYS  Final   Report Status PENDING  Incomplete  Blood Culture (routine x 2)     Status: None (Preliminary result)   Collection Time: 10/19/16  2:17 PM  Result Value Ref Range Status   Specimen Description BLOOD RIGHT HAND  Final   Special Requests BOTTLES DRAWN AEROBIC ONLY 4CC  Final   Culture NO GROWTH 3 DAYS  Final   Report Status PENDING  Incomplete  MRSA PCR Screening     Status: None   Collection Time: 10/19/16  6:02 PM  Result Value Ref Range Status   MRSA by PCR NEGATIVE NEGATIVE Final    Comment:        The GeneXpert MRSA Assay (FDA approved for NASAL specimens only), is one component of a comprehensive MRSA colonization surveillance program. It is not intended to diagnose MRSA infection nor to guide or monitor treatment for MRSA infections.      Labs: BNP (last 3 results)  Recent Labs  03/26/16 1456  BNP [id number]   Basic Metabolic Panel:  Recent Labs Lab 10/19/16 1424 10/20/16 0414 10/21/16 0424 10/22/16 0616  NA 129* 134* 136 133*  K 3.4* 4.2 5.0 4.3  CL 101 109 114* 108  CO2 19* 20* 17* 21*  GLUCOSE 224* 211* 248* 172*  BUN 26* 34* 29* 18  CREATININE 0.94 1.02* 0.74 0.60  CALCIUM 7.8* 7.4* 7.7* 8.2*   Liver Function Tests:  Recent Labs Lab 10/19/16 1424 10/21/16 0424 10/22/16 0616  AST 23 19 15   ALT 12* 11* 12*  ALKPHOS 71 52 58  BILITOT 2.1* 0.7 0.7  PROT 5.3* 4.7* 5.1*  ALBUMIN 2.8* 2.4* 2.5*   No results for input(s): LIPASE, AMYLASE in the last 168 hours.  Recent Labs Lab 10/20/16 1149  AMMONIA 59*   CBC:  Recent Labs Lab 10/19/16 1424 10/20/16 0414 10/21/16 0424 10/22/16 0616  WBC 19.3* 5.2 7.0 3.6*  NEUTROABS 17.7*  --   --   --   HGB 11.3* 8.5* 10.0* 10.0*  HCT 34.9* 28.5* 30.3* 30.5*  MCV 81.0 81.4 81.9 81.1  PLT 118* 73* 87* 99*    Cardiac Enzymes:  Recent  Labs Lab 10/19/16 1424  TROPONINI <0.03   BNP: Invalid input(s): POCBNP CBG:  Recent Labs Lab 10/22/16 0801 10/22/16 1108 10/22/16 1638 10/22/16 2035 10/23/16 0728  GLUCAP 139* 216* 221* 221* 186*   D-Dimer No results for input(s): DDIMER in the last 72 hours. Hgb A1c No results for input(s): HGBA1C in the last 72 hours. Lipid Profile No results for input(s): CHOL, HDL, LDLCALC, TRIG, CHOLHDL, LDLDIRECT in the last 72 hours. Thyroid function studies No results for input(s): TSH, T4TOTAL, T3FREE, THYROIDAB in the last 72 hours.  Invalid input(s): FREET3 Anemia work up No results for input(s): VITAMINB12, FOLATE, FERRITIN, TIBC, IRON, RETICCTPCT in the last 72 hours. Urinalysis    Component Value Date/Time   COLORURINE AMBER (A) 10/19/2016 1332   APPEARANCEUR CLOUDY (A) 10/19/2016 1332   LABSPEC 1.021 10/19/2016 1332   PHURINE 5.0 10/19/2016 1332   GLUCOSEU NEGATIVE 10/19/2016 1332   HGBUR MODERATE (A) 10/19/2016 1332   BILIRUBINUR NEGATIVE 10/19/2016 1332   KETONESUR NEGATIVE 10/19/2016 1332   PROTEINUR 30 (A) 10/19/2016 1332   UROBILINOGEN 1.0 01/18/2015 0139   NITRITE POSITIVE (A) 10/19/2016 1332   LEUKOCYTESUR TRACE (A) 10/19/2016 1332   Sepsis Labs Invalid input(s): PROCALCITONIN,  WBC,  LACTICIDVEN Microbiology Recent Results (from the past 240 hour(s))  Urine culture     Status: Abnormal   Collection Time: 10/19/16  1:32 PM  Result Value Ref Range Status   Specimen Description URINE, CATHETERIZED  Final   Special Requests NONE  Final   Culture >=100,000 COLONIES/mL ESCHERICHIA COLI (A)  Final   Report Status 10/22/2016 FINAL  Final   Organism ID, Bacteria ESCHERICHIA COLI (A)  Final      Susceptibility   Escherichia coli - MIC*    AMPICILLIN >=32 RESISTANT Resistant     CEFAZOLIN 32 INTERMEDIATE Intermediate     CEFTRIAXONE <=1 SENSITIVE Sensitive     CIPROFLOXACIN >=4 RESISTANT Resistant     GENTAMICIN <=1 SENSITIVE  Sensitive     IMIPENEM <=0.25 SENSITIVE Sensitive     NITROFURANTOIN 64 INTERMEDIATE Intermediate     TRIMETH/SULFA >=320 RESISTANT Resistant     AMPICILLIN/SULBACTAM >=32 RESISTANT Resistant     PIP/TAZO 64 INTERMEDIATE Intermediate     Extended ESBL NEGATIVE Sensitive     * >=100,000 COLONIES/mL ESCHERICHIA COLI  Blood Culture (routine x 2)     Status: None (Preliminary result)   Collection Time: 10/19/16  2:04 PM  Result Value Ref Range Status   Specimen Description BLOOD RIGHT THUMB  Final   Special Requests BOTTLES DRAWN AEROBIC ONLY 4CC  Final   Culture NO GROWTH 3 DAYS  Final   Report Status PENDING  Incomplete  Blood Culture (routine x 2)     Status: None (Preliminary result)   Collection Time: 10/19/16  2:17 PM  Result Value Ref Range Status   Specimen Description BLOOD RIGHT HAND  Final   Special Requests BOTTLES DRAWN AEROBIC ONLY 4CC  Final   Culture NO GROWTH 3 DAYS  Final   Report Status PENDING  Incomplete  MRSA PCR Screening     Status: None   Collection Time: 10/19/16  6:02 PM  Result Value Ref Range Status   MRSA by PCR NEGATIVE NEGATIVE Final    Comment:        The GeneXpert MRSA Assay (FDA approved for NASAL specimens only), is one component of a comprehensive MRSA colonization surveillance program. It is not intended to diagnose MRSA infection nor to guide or monitor treatment for  MRSA infections.      Time coordinating discharge: Over 30 minutes  SIGNED:   Louellen Molder, MD  [facility] 10/23/2016, 10:48 AM Pager   If 7PM-7AM, please contact night-coverage [email or web address] Password [email or web address]

## 2016-10-24 DIAGNOSIS — Z7982 Long term (current) use of aspirin: Secondary | ICD-10-CM | POA: Diagnosis not present

## 2016-10-24 DIAGNOSIS — K7460 Unspecified cirrhosis of liver: Secondary | ICD-10-CM | POA: Diagnosis not present

## 2016-10-24 DIAGNOSIS — Z7951 Long term (current) use of inhaled steroids: Secondary | ICD-10-CM | POA: Diagnosis not present

## 2016-10-24 DIAGNOSIS — E119 Type 2 diabetes mellitus without complications: Secondary | ICD-10-CM | POA: Diagnosis not present

## 2016-10-24 DIAGNOSIS — J45909 Unspecified asthma, uncomplicated: Secondary | ICD-10-CM | POA: Diagnosis not present

## 2016-10-24 DIAGNOSIS — E784 Other hyperlipidemia: Secondary | ICD-10-CM | POA: Diagnosis not present

## 2016-10-24 DIAGNOSIS — I2510 Atherosclerotic heart disease of native coronary artery without angina pectoris: Secondary | ICD-10-CM | POA: Diagnosis not present

## 2016-10-24 DIAGNOSIS — D139 Benign neoplasm of ill-defined sites within the digestive system: Secondary | ICD-10-CM | POA: Diagnosis not present

## 2016-10-24 DIAGNOSIS — I10 Essential (primary) hypertension: Secondary | ICD-10-CM | POA: Diagnosis not present

## 2016-10-24 LAB — CULTURE, BLOOD (ROUTINE X 2)
CULTURE: NO GROWTH
CULTURE: NO GROWTH

## 2016-10-26 DIAGNOSIS — D139 Benign neoplasm of ill-defined sites within the digestive system: Secondary | ICD-10-CM | POA: Diagnosis not present

## 2016-10-26 DIAGNOSIS — K7460 Unspecified cirrhosis of liver: Secondary | ICD-10-CM | POA: Diagnosis not present

## 2016-10-26 DIAGNOSIS — Z7951 Long term (current) use of inhaled steroids: Secondary | ICD-10-CM | POA: Diagnosis not present

## 2016-10-26 DIAGNOSIS — I2510 Atherosclerotic heart disease of native coronary artery without angina pectoris: Secondary | ICD-10-CM | POA: Diagnosis not present

## 2016-10-26 DIAGNOSIS — J45909 Unspecified asthma, uncomplicated: Secondary | ICD-10-CM | POA: Diagnosis not present

## 2016-10-26 DIAGNOSIS — Z7982 Long term (current) use of aspirin: Secondary | ICD-10-CM | POA: Diagnosis not present

## 2016-10-26 DIAGNOSIS — E784 Other hyperlipidemia: Secondary | ICD-10-CM | POA: Diagnosis not present

## 2016-10-26 DIAGNOSIS — E119 Type 2 diabetes mellitus without complications: Secondary | ICD-10-CM | POA: Diagnosis not present

## 2016-10-26 DIAGNOSIS — I10 Essential (primary) hypertension: Secondary | ICD-10-CM | POA: Diagnosis not present

## 2016-10-29 ENCOUNTER — Other Ambulatory Visit: Payer: Self-pay | Admitting: "Endocrinology

## 2016-10-30 DIAGNOSIS — I2510 Atherosclerotic heart disease of native coronary artery without angina pectoris: Secondary | ICD-10-CM | POA: Diagnosis not present

## 2016-10-30 DIAGNOSIS — J45909 Unspecified asthma, uncomplicated: Secondary | ICD-10-CM | POA: Diagnosis not present

## 2016-10-30 DIAGNOSIS — I10 Essential (primary) hypertension: Secondary | ICD-10-CM | POA: Diagnosis not present

## 2016-10-30 DIAGNOSIS — E119 Type 2 diabetes mellitus without complications: Secondary | ICD-10-CM | POA: Diagnosis not present

## 2016-10-30 DIAGNOSIS — K7460 Unspecified cirrhosis of liver: Secondary | ICD-10-CM | POA: Diagnosis not present

## 2016-10-30 DIAGNOSIS — E784 Other hyperlipidemia: Secondary | ICD-10-CM | POA: Diagnosis not present

## 2016-10-30 DIAGNOSIS — Z7982 Long term (current) use of aspirin: Secondary | ICD-10-CM | POA: Diagnosis not present

## 2016-10-30 DIAGNOSIS — Z7951 Long term (current) use of inhaled steroids: Secondary | ICD-10-CM | POA: Diagnosis not present

## 2016-10-30 DIAGNOSIS — D139 Benign neoplasm of ill-defined sites within the digestive system: Secondary | ICD-10-CM | POA: Diagnosis not present

## 2016-11-01 DIAGNOSIS — Z7951 Long term (current) use of inhaled steroids: Secondary | ICD-10-CM | POA: Diagnosis not present

## 2016-11-01 DIAGNOSIS — J45909 Unspecified asthma, uncomplicated: Secondary | ICD-10-CM | POA: Diagnosis not present

## 2016-11-01 DIAGNOSIS — D139 Benign neoplasm of ill-defined sites within the digestive system: Secondary | ICD-10-CM | POA: Diagnosis not present

## 2016-11-01 DIAGNOSIS — E119 Type 2 diabetes mellitus without complications: Secondary | ICD-10-CM | POA: Diagnosis not present

## 2016-11-01 DIAGNOSIS — Z7982 Long term (current) use of aspirin: Secondary | ICD-10-CM | POA: Diagnosis not present

## 2016-11-01 DIAGNOSIS — I10 Essential (primary) hypertension: Secondary | ICD-10-CM | POA: Diagnosis not present

## 2016-11-01 DIAGNOSIS — E784 Other hyperlipidemia: Secondary | ICD-10-CM | POA: Diagnosis not present

## 2016-11-01 DIAGNOSIS — I2510 Atherosclerotic heart disease of native coronary artery without angina pectoris: Secondary | ICD-10-CM | POA: Diagnosis not present

## 2016-11-01 DIAGNOSIS — K7460 Unspecified cirrhosis of liver: Secondary | ICD-10-CM | POA: Diagnosis not present

## 2016-11-02 DIAGNOSIS — K7460 Unspecified cirrhosis of liver: Secondary | ICD-10-CM | POA: Diagnosis not present

## 2016-11-02 DIAGNOSIS — E119 Type 2 diabetes mellitus without complications: Secondary | ICD-10-CM | POA: Diagnosis not present

## 2016-11-02 DIAGNOSIS — E784 Other hyperlipidemia: Secondary | ICD-10-CM | POA: Diagnosis not present

## 2016-11-02 DIAGNOSIS — Z7951 Long term (current) use of inhaled steroids: Secondary | ICD-10-CM | POA: Diagnosis not present

## 2016-11-02 DIAGNOSIS — J45909 Unspecified asthma, uncomplicated: Secondary | ICD-10-CM | POA: Diagnosis not present

## 2016-11-02 DIAGNOSIS — D139 Benign neoplasm of ill-defined sites within the digestive system: Secondary | ICD-10-CM | POA: Diagnosis not present

## 2016-11-02 DIAGNOSIS — I10 Essential (primary) hypertension: Secondary | ICD-10-CM | POA: Diagnosis not present

## 2016-11-02 DIAGNOSIS — I2510 Atherosclerotic heart disease of native coronary artery without angina pectoris: Secondary | ICD-10-CM | POA: Diagnosis not present

## 2016-11-02 DIAGNOSIS — Z7982 Long term (current) use of aspirin: Secondary | ICD-10-CM | POA: Diagnosis not present

## 2016-11-06 ENCOUNTER — Other Ambulatory Visit: Payer: Self-pay | Admitting: "Endocrinology

## 2016-11-06 DIAGNOSIS — K7460 Unspecified cirrhosis of liver: Secondary | ICD-10-CM | POA: Diagnosis not present

## 2016-11-06 DIAGNOSIS — E119 Type 2 diabetes mellitus without complications: Secondary | ICD-10-CM | POA: Diagnosis not present

## 2016-11-06 DIAGNOSIS — I10 Essential (primary) hypertension: Secondary | ICD-10-CM | POA: Diagnosis not present

## 2016-11-06 DIAGNOSIS — J45909 Unspecified asthma, uncomplicated: Secondary | ICD-10-CM | POA: Diagnosis not present

## 2016-11-06 DIAGNOSIS — Z7982 Long term (current) use of aspirin: Secondary | ICD-10-CM | POA: Diagnosis not present

## 2016-11-06 DIAGNOSIS — I2510 Atherosclerotic heart disease of native coronary artery without angina pectoris: Secondary | ICD-10-CM | POA: Diagnosis not present

## 2016-11-06 DIAGNOSIS — Z7951 Long term (current) use of inhaled steroids: Secondary | ICD-10-CM | POA: Diagnosis not present

## 2016-11-06 DIAGNOSIS — E784 Other hyperlipidemia: Secondary | ICD-10-CM | POA: Diagnosis not present

## 2016-11-06 DIAGNOSIS — D139 Benign neoplasm of ill-defined sites within the digestive system: Secondary | ICD-10-CM | POA: Diagnosis not present

## 2016-11-07 NOTE — Telephone Encounter (Signed)
She may get 1 months supply , needs a visit with [place].

## 2016-11-07 NOTE — Telephone Encounter (Signed)
Pt requesting Novolog refill. She has had labs. No appt. Has been in hospital. Last seen 04-2016

## 2016-11-08 DIAGNOSIS — E119 Type 2 diabetes mellitus without complications: Secondary | ICD-10-CM | POA: Diagnosis not present

## 2016-11-08 DIAGNOSIS — I2510 Atherosclerotic heart disease of native coronary artery without angina pectoris: Secondary | ICD-10-CM | POA: Diagnosis not present

## 2016-11-08 DIAGNOSIS — Z7982 Long term (current) use of aspirin: Secondary | ICD-10-CM | POA: Diagnosis not present

## 2016-11-08 DIAGNOSIS — K7460 Unspecified cirrhosis of liver: Secondary | ICD-10-CM | POA: Diagnosis not present

## 2016-11-08 DIAGNOSIS — Z7951 Long term (current) use of inhaled steroids: Secondary | ICD-10-CM | POA: Diagnosis not present

## 2016-11-08 DIAGNOSIS — J45909 Unspecified asthma, uncomplicated: Secondary | ICD-10-CM | POA: Diagnosis not present

## 2016-11-08 DIAGNOSIS — I10 Essential (primary) hypertension: Secondary | ICD-10-CM | POA: Diagnosis not present

## 2016-11-08 DIAGNOSIS — D139 Benign neoplasm of ill-defined sites within the digestive system: Secondary | ICD-10-CM | POA: Diagnosis not present

## 2016-11-08 DIAGNOSIS — E784 Other hyperlipidemia: Secondary | ICD-10-CM | POA: Diagnosis not present

## 2016-11-09 DIAGNOSIS — D638 Anemia in other chronic diseases classified elsewhere: Secondary | ICD-10-CM | POA: Diagnosis not present

## 2016-11-09 DIAGNOSIS — Z1389 Encounter for screening for other disorder: Secondary | ICD-10-CM | POA: Diagnosis not present

## 2016-11-09 DIAGNOSIS — E876 Hypokalemia: Secondary | ICD-10-CM | POA: Diagnosis not present

## 2016-11-09 DIAGNOSIS — N10 Acute tubulo-interstitial nephritis: Secondary | ICD-10-CM | POA: Diagnosis not present

## 2016-11-09 DIAGNOSIS — H6123 Impacted cerumen, bilateral: Secondary | ICD-10-CM | POA: Diagnosis not present

## 2016-11-13 DIAGNOSIS — E1142 Type 2 diabetes mellitus with diabetic polyneuropathy: Secondary | ICD-10-CM | POA: Diagnosis not present

## 2016-11-13 DIAGNOSIS — E7220 Disorder of urea cycle metabolism, unspecified: Secondary | ICD-10-CM | POA: Diagnosis not present

## 2016-11-19 DIAGNOSIS — M169 Osteoarthritis of hip, unspecified: Secondary | ICD-10-CM | POA: Diagnosis not present

## 2016-11-19 DIAGNOSIS — M150 Primary generalized (osteo)arthritis: Secondary | ICD-10-CM | POA: Diagnosis not present

## 2016-11-21 DIAGNOSIS — E119 Type 2 diabetes mellitus without complications: Secondary | ICD-10-CM | POA: Diagnosis not present

## 2016-11-21 DIAGNOSIS — K7460 Unspecified cirrhosis of liver: Secondary | ICD-10-CM | POA: Diagnosis not present

## 2016-11-21 DIAGNOSIS — D139 Benign neoplasm of ill-defined sites within the digestive system: Secondary | ICD-10-CM | POA: Diagnosis not present

## 2016-11-21 DIAGNOSIS — E784 Other hyperlipidemia: Secondary | ICD-10-CM | POA: Diagnosis not present

## 2016-11-21 DIAGNOSIS — J45909 Unspecified asthma, uncomplicated: Secondary | ICD-10-CM | POA: Diagnosis not present

## 2016-11-21 DIAGNOSIS — I2510 Atherosclerotic heart disease of native coronary artery without angina pectoris: Secondary | ICD-10-CM | POA: Diagnosis not present

## 2016-11-21 DIAGNOSIS — I10 Essential (primary) hypertension: Secondary | ICD-10-CM | POA: Diagnosis not present

## 2016-11-21 DIAGNOSIS — Z7982 Long term (current) use of aspirin: Secondary | ICD-10-CM | POA: Diagnosis not present

## 2016-11-21 DIAGNOSIS — Z7951 Long term (current) use of inhaled steroids: Secondary | ICD-10-CM | POA: Diagnosis not present

## 2016-11-22 DIAGNOSIS — M150 Primary generalized (osteo)arthritis: Secondary | ICD-10-CM | POA: Diagnosis not present

## 2016-11-23 DIAGNOSIS — R6889 Other general symptoms and signs: Secondary | ICD-10-CM | POA: Diagnosis not present

## 2016-11-26 ENCOUNTER — Other Ambulatory Visit: Payer: Self-pay | Admitting: "Endocrinology

## 2016-12-03 ENCOUNTER — Other Ambulatory Visit: Payer: Self-pay | Admitting: "Endocrinology

## 2016-12-06 ENCOUNTER — Telehealth: Payer: Self-pay

## 2016-12-06 MED ORDER — INSULIN LISPRO 100 UNIT/ML (KWIKPEN): PEN_INJECTOR | SUBCUTANEOUS | 0 refills | Status: DC

## 2016-12-06 MED ORDER — INSULIN DETEMIR 100 UNIT/ML FLEXPEN: PEN_INJECTOR | SUBCUTANEOUS | 0 refills | Status: DC

## 2016-12-06 NOTE — Telephone Encounter (Signed)
Pt made appt. She has been in the hospital and has limited mobolity. Labs have been done. She is needing a refill on insulins. Can we refill this?

## 2016-12-06 NOTE — Telephone Encounter (Signed)
Go  head and refill for 1 month.

## 2016-12-07 ENCOUNTER — Other Ambulatory Visit: Payer: Self-pay | Admitting: "Endocrinology

## 2016-12-13 DIAGNOSIS — N390 Urinary tract infection, site not specified: Secondary | ICD-10-CM | POA: Diagnosis not present

## 2016-12-13 DIAGNOSIS — E784 Other hyperlipidemia: Secondary | ICD-10-CM | POA: Diagnosis not present

## 2016-12-13 DIAGNOSIS — Z7951 Long term (current) use of inhaled steroids: Secondary | ICD-10-CM | POA: Diagnosis not present

## 2016-12-13 DIAGNOSIS — E119 Type 2 diabetes mellitus without complications: Secondary | ICD-10-CM | POA: Diagnosis not present

## 2016-12-13 DIAGNOSIS — Z7982 Long term (current) use of aspirin: Secondary | ICD-10-CM | POA: Diagnosis not present

## 2016-12-13 DIAGNOSIS — I10 Essential (primary) hypertension: Secondary | ICD-10-CM | POA: Diagnosis not present

## 2016-12-13 DIAGNOSIS — D139 Benign neoplasm of ill-defined sites within the digestive system: Secondary | ICD-10-CM | POA: Diagnosis not present

## 2016-12-13 DIAGNOSIS — K7460 Unspecified cirrhosis of liver: Secondary | ICD-10-CM | POA: Diagnosis not present

## 2016-12-13 DIAGNOSIS — J45909 Unspecified asthma, uncomplicated: Secondary | ICD-10-CM | POA: Diagnosis not present

## 2016-12-13 DIAGNOSIS — B9620 Unspecified Escherichia coli [E. coli] as the cause of diseases classified elsewhere: Secondary | ICD-10-CM | POA: Diagnosis not present

## 2016-12-13 DIAGNOSIS — R6520 Severe sepsis without septic shock: Secondary | ICD-10-CM | POA: Diagnosis not present

## 2016-12-13 DIAGNOSIS — I2510 Atherosclerotic heart disease of native coronary artery without angina pectoris: Secondary | ICD-10-CM | POA: Diagnosis not present

## 2016-12-14 DIAGNOSIS — E1142 Type 2 diabetes mellitus with diabetic polyneuropathy: Secondary | ICD-10-CM | POA: Diagnosis not present

## 2016-12-14 DIAGNOSIS — E7220 Disorder of urea cycle metabolism, unspecified: Secondary | ICD-10-CM | POA: Diagnosis not present

## 2016-12-20 DIAGNOSIS — M150 Primary generalized (osteo)arthritis: Secondary | ICD-10-CM | POA: Diagnosis not present

## 2016-12-20 DIAGNOSIS — M169 Osteoarthritis of hip, unspecified: Secondary | ICD-10-CM | POA: Diagnosis not present

## 2016-12-23 DIAGNOSIS — M150 Primary generalized (osteo)arthritis: Secondary | ICD-10-CM | POA: Diagnosis not present

## 2016-12-26 ENCOUNTER — Ambulatory Visit (INDEPENDENT_AMBULATORY_CARE_PROVIDER_SITE_OTHER): Payer: Medicare Other | Admitting: "Endocrinology

## 2016-12-26 ENCOUNTER — Encounter: Payer: Self-pay | Admitting: "Endocrinology

## 2016-12-26 VITALS — BP 133/71 | HR 78 | Ht 68.0 in

## 2016-12-26 DIAGNOSIS — I10 Essential (primary) hypertension: Secondary | ICD-10-CM | POA: Diagnosis not present

## 2016-12-26 DIAGNOSIS — E1165 Type 2 diabetes mellitus with hyperglycemia: Secondary | ICD-10-CM | POA: Diagnosis not present

## 2016-12-26 DIAGNOSIS — E782 Mixed hyperlipidemia: Secondary | ICD-10-CM | POA: Diagnosis not present

## 2016-12-26 DIAGNOSIS — IMO0002 Reserved for concepts with insufficient information to code with codable children: Secondary | ICD-10-CM

## 2016-12-26 DIAGNOSIS — E118 Type 2 diabetes mellitus with unspecified complications: Secondary | ICD-10-CM

## 2016-12-26 DIAGNOSIS — Z794 Long term (current) use of insulin: Secondary | ICD-10-CM | POA: Diagnosis not present

## 2016-12-26 NOTE — Progress Notes (Signed)
Subjective:    Patient ID: [name] [name], female    DOB: 1941-09-13, PCP Purvis Kilts, MD   Past Medical History:  Diagnosis Date  . Abnormal EKG    hx of left anterior fasicular block on 04-09-13 ekg epic  . Allergic rhinitis, cause unspecified   . Bilateral leg weakness   . CAD in native artery   . Chronic gastritis 03/09/11   egd by Dr. Gala Romney  . Chronic shoulder pain   . Cirrhosis ([place])   . Cirrhosis of liver ([facility])    NASH, afp on 06/17/12 =3.7, per pt she had hep B vaccines in 1996, hep A in process.   . Colon adenomas 03/08/10   tcs by Dr. Gala Romney  . Diverticula of colon 03/08/10   L side  . Esophagitis, erosive 03/08/10  . GERD (gastroesophageal reflux disease)   . History of hemorrhoids 03/08/10   tcs- internal and external  . Hyperplastic polyps of stomach 03/08/10   tcs by Dr. Dudley Major  . Intrinsic asthma, unspecified   . Obesity, unspecified   . Other and unspecified hyperlipidemia   . Pancreatitis   . Pancytopenia, acquired ([place]) 04/12/2014  . Personal history of neurosis   . Thrombocytopenia ([place]) 04/12/2014  . Thrombocytopenia, unspecified   . Type II or unspecified type diabetes mellitus without mention of complication, not stated as uncontrolled   . Unspecified essential hypertension   . Unspecified fall   . Unspecified hereditary and idiopathic peripheral neuropathy   . Vertigo   . Wears glasses    Past Surgical History:  Procedure Laterality Date  . ABDOMINAL HYSTERECTOMY    . BREAST SURGERY Right few yrs ago   benign area removed  . CARDIAC CATHETERIZATION  02/25/2007   Dr. [name]:  normal LV systolic function, mild irregularities of LAD  . COLONOSCOPY  03/08/10   Dr. [name]-->ext/int hemorrhoids, anal paipilla, rectal polyps, desc polyps, cecal polyp, left-sided diverticula. suboptiomal prep. next TCS 02/2015. multiple adenomas  . ESOPHAGOGASTRODUODENOSCOPY  03/08/10   Dr. [name] erosive RE, small hh, antral erosions, small 1cm are of mucosal  indentation along gastric body of doubtful significance, cystic nodularity of hypophyarynx, base of tongue, base of epiglottis. benign gastric biopsies  . ESOPHAGOGASTRODUODENOSCOPY  10/08/2011   Dr. [name] esophageal varices, antral erosion. next egd 09/2013  . ESOPHAGOGASTRODUODENOSCOPY N/A 02/17/2014   Dr. Gala Romney: portal gastropathy, no varices. Due for surveillance 2017  . EYE SURGERY     cataracts bilateral  . FOOT SURGERY  2003   lt foot  . hysterectomy and btl     s/p  . ORIF HUMERUS FRACTURE Right 02/17/2013   Procedure: OPEN REDUCTION INTERNAL FIXATION (ORIF) RIGHT PROXIMAL HUMERUS FRACTURE;  Surgeon: [name], MD;  Location: [facility];  Service: Orthopedics;  Laterality: Right;  . PARTIAL GASTRECTOMY     family denies  . TUBAL LIGATION    . TUMOR EXCISION  2003   rt arm and left foot   Social History   Social History  . Marital status: Married    Spouse name: N/A  . Number of children: 3  . Years of education: N/A   Social History Main Topics  . Smoking status: Never Smoker  . Smokeless tobacco: Never Used  . Alcohol use No  . Drug use: No  . Sexual activity: Not Currently    Birth control/ protection: Surgical   Other Topics Concern  . None   Social History Narrative  . None   Outpatient  Encounter Prescriptions as of 12/26/2016  Medication Sig  . ALPRAZolam (XANAX) 0.5 MG tablet Take 1 tablet (0.5 mg total) by mouth 2 (two) times daily.  Marland Kitchen aspirin EC 81 MG tablet Take 81 mg by mouth daily.  . budesonide-formoterol (SYMBICORT) 160-4.5 MCG/ACT inhaler Inhale 2 puffs into the lungs at bedtime.  . diphenhydrAMINE (BENADRYL) 25 MG tablet Take 25 mg by mouth every 6 (six) hours as needed.  . enalapril (VASOTEC) 5 MG tablet Take 1 tablet by mouth daily.  . fluconazole (DIFLUCAN) 150 MG tablet Take 150 mg by mouth as directed. Every 4 days  . furosemide (LASIX) 20 MG tablet Take 2 tablets (40 mg total) by mouth daily.  Marland Kitchen HUMALOG KWIKPEN 100 UNIT/ML KiwkPen INJECT  10-16 UNITS INTO THE SKIN THREE TIMES DAILY  . Insulin Detemir (LEVEMIR FLEXTOUCH) 100 UNIT/ML Pen INJECT 50 UNITS INTO THE SKIN DAILY AT 10PM  . lactulose (CHRONULAC) 10 GM/15ML solution Take 20 g by mouth 3 (three) times daily.   . meclizine (ANTIVERT) 25 MG tablet Take 25 mg by mouth 3 (three) times daily as needed for dizziness.   Marland Kitchen NEXIUM 40 MG capsule Take 40 mg by mouth every morning.   Glory Rosebush VERIO test strip USE UP TO FOUR TIMES DAILY AS DIRECTED  . pregabalin (LYRICA) 100 MG capsule Take 100 mg by mouth 2 (two) times daily.  . rifaximin (XIFAXAN) 550 MG TABS tablet Take 1 tablet (550 mg total) by mouth 2 (two) times daily.  Marland Kitchen spironolactone (ALDACTONE) 50 MG tablet TAKE ONE TABLET BY MOUTH ONCE DAILY IN THE MORNING  . triamcinolone cream (KENALOG) 0.1 % Apply 1 application topically daily as needed (for irritation).   . ursodiol (URSO FORTE) 500 MG tablet Take 500 mg by mouth 2 (two) times daily.  . [DISCONTINUED] HUMALOG KWIKPEN 100 UNIT/ML KiwkPen INJECT 10-16 UNITS INTO THE SKIN THREE TIMES DAILY (Patient not taking: Reported on 10/19/2016)  . [DISCONTINUED] HUMALOG KWIKPEN 100 UNIT/ML KiwkPen INJECT 10-16 UNITS INTO THE SKIN THREE TIMES DAILY  . [DISCONTINUED] insulin lispro (HUMALOG KWIKPEN) 100 UNIT/ML KiwkPen INJECT 10-16 UNITS INTO THE SKIN THREE TIMES DAILY   No facility-administered encounter medications on file as of 12/26/2016.    ALLERGIES: Allergies  Allergen Reactions  . Morphine And Related Itching  . Compazine [Prochlorperazine Edisylate] Anxiety    Causes anxiety & nervousness   VACCINATION STATUS: Immunization History  Administered Date(s) Administered  . Influenza,inj,Quad PF,36+ Mos 07/31/2013, 08/05/2014, 08/06/2016  . Tdap 09/18/2013    Diabetes  She presents for her follow-up (Patient missed several appointments, last seen here 20 months ago.) diabetic visit. She has type 2 diabetes mellitus. Onset time: she was diagnosed at approximate age of 78  years. Her disease course has been improving. There are no hypoglycemic associated symptoms. Pertinent negatives for hypoglycemia include no confusion, headaches, pallor or seizures. Pertinent negatives for diabetes include no chest pain, no polydipsia, no polyphagia and no polyuria. There are no hypoglycemic complications. Symptoms are improving. Diabetic complications include peripheral neuropathy. Risk factors for coronary artery disease include diabetes mellitus, hypertension, sedentary lifestyle, obesity and tobacco exposure. She is compliant with treatment some of the time (Her logs show that NovoLog was utilized 4 times a day including at bedtime against the order provided to her last visit.). Her weight is stable. She is following a generally unhealthy diet. She has not had a previous visit with a dietitian. She never participates in exercise. Her breakfast blood glucose range is generally 130-140 mg/dl. Her  lunch blood glucose range is generally 140-180 mg/dl. Her dinner blood glucose range is generally 140-180 mg/dl. Her overall blood glucose range is 140-180 mg/dl. An ACE inhibitor/angiotensin II receptor blocker is being taken.  Hyperlipidemia  This is a chronic problem. The current episode started more than 1 year ago. Pertinent negatives include no chest pain, myalgias or shortness of breath. Risk factors for coronary artery disease include diabetes mellitus, hypertension and a sedentary lifestyle.  Hypertension  This is a chronic problem. The current episode started more than 1 year ago. Pertinent negatives include no chest pain, headaches, palpitations or shortness of breath. Past treatments include ACE inhibitors.    Review of Systems  Constitutional: Negative for unexpected weight change.  HENT: Negative for trouble swallowing and voice change.   Eyes: Negative for visual disturbance.  Respiratory: Negative for cough, shortness of breath and wheezing.   Cardiovascular: Negative for  chest pain, palpitations and leg swelling.  Gastrointestinal: Negative for diarrhea, nausea and vomiting.  Endocrine: Negative for cold intolerance, heat intolerance, polydipsia, polyphagia and polyuria.  Musculoskeletal: Negative for arthralgias and myalgias.  Skin: Negative for color change, pallor, rash and wound.  Neurological: Negative for seizures and headaches.  Psychiatric/Behavioral: Negative for confusion and suicidal ideas.    Objective:    BP 133/71   Pulse 78   Ht 5\' 8"  (1.727 m)   Wt Readings from Last 3 Encounters:  10/21/16 270 lb 8.1 oz (122.7 kg)  09/30/16 248 lb (112.5 kg)  08/06/16 247 lb 9.2 oz (112.3 kg)    Physical Exam  Constitutional: She is oriented to person, place, and time. She appears well-developed.  Remains wheelchair bound.  HENT:  Head: Normocephalic and atraumatic.  Eyes: EOM are normal.  Neck: Normal range of motion. Neck supple. No tracheal deviation present. No thyromegaly present.  Cardiovascular: Normal rate and regular rhythm.   Pulmonary/Chest: Effort normal and breath sounds normal.  Abdominal: Soft. Bowel sounds are normal. There is no tenderness. There is no guarding.  Musculoskeletal: Normal range of motion. She exhibits no edema.  Neurological: She is alert and oriented to person, place, and time. No cranial nerve deficit. Coordination normal.  Remains wheelchair bound.   Skin: Skin is warm and dry. No rash noted. No erythema. No pallor.  Psychiatric: She has a normal mood and affect. Judgment normal.   CMP     Component Value Date/Time   NA 133 (L) 10/22/2016 0616   K 4.3 10/22/2016 0616   CL 108 10/22/2016 0616   CO2 21 (L) 10/22/2016 0616   GLUCOSE 172 (H) 10/22/2016 0616   BUN 18 10/22/2016 0616   CREATININE 0.60 10/22/2016 0616   CREATININE 0.67 07/17/2016 1121   CALCIUM 8.2 (L) 10/22/2016 0616   PROT 5.1 (L) 10/22/2016 0616   ALBUMIN 2.5 (L) 10/22/2016 0616   AST 15 10/22/2016 0616   ALT 12 (L) 10/22/2016 0616    ALKPHOS 58 10/22/2016 0616   BILITOT 0.7 10/22/2016 0616   GFRNONAA >60 10/22/2016 0616   GFRNONAA 87 07/17/2016 1121   GFRAA >60 10/22/2016 0616   GFRAA >89 07/17/2016 1121    Diabetic Labs (most recent): Lab Results  Component Value Date   HGBA1C 6.1 (H) 09/29/2016   HGBA1C 6.7 (H) 07/17/2016   HGBA1C 6.5 (H) 04/17/2016   Lipid Panel     Component Value Date/Time   CHOL 132 04/28/2013 0500   TRIG 53 04/28/2013 0500   HDL 59 04/28/2013 0500   CHOLHDL 2.2 04/28/2013 0500  VLDL 11 04/28/2013 0500   LDLCALC 62 04/28/2013 0500    Assessment & Plan:   1. Uncontrolled type 2 diabetes mellitus with peripheral neuropathy ([place])  - Her  diabetes is  complicated by neuropathy and patient remains at a high risk for more acute and chronic complications of diabetes which include CAD, CVA, CKD, retinopathy, and neuropathy. These are all discussed in detail with the patient.  Patient came with Controlled glucose profile and  recent A1c 6.1% improving from 7.9 %.  Recent labs reviewed.  - Since her last visit she was hospitalized for sepsis originating from Escherichia coli UTI. - I have re-counseled the patient on diet management and weight loss  by adopting a carbohydrate restricted / protein rich  Diet.  - Suggestion is made for patient to avoid simple carbohydrates   from their diet including Cakes , Desserts, Ice Cream,  Soda (  diet and regular) , Sweet Tea , Candies,  Chips, Cookies, Artificial Sweeteners,   and "Sugar-free" Products .  This will help patient to have stable blood glucose profile and potentially avoid unintended  Weight gain.  - Patient is advised to stick to a routine mealtimes to eat 3 meals  a day and avoid unnecessary snacks ( to snack only to correct hypoglycemia).  - The patient  Will be scheduled with Jearld Fenton, RDN, CDE for individualized DM education.  - I have approached patient with the following individualized plan to manage diabetes and patient  agrees.   - I will  increase Levemir to 40 units daily at bedtime, continue Humalog 10 units 3 times a day before meals  plus SSI.   - she is asked to resume to document BG readings ac and hs.  -insulin is the only safe medication she should be getting given her hx of  Liver failure from unidentified medication. - Patient specific target  for A1c; LDL, HDL, Triglycerides, and  Waist Circumference were discussed in detail.  2) BP/HTN: uncontrolled . Continue current medications including ACEI/ARB.  3)  Weight/Diet: CDE consult will be requested, basic  carbohydrates information provided.  4) Chronic Care/Health Maintenance:  -Patient is on ACEI/ARB  medications and encouraged to continue to follow up with Ophthalmology, Podiatrist at least yearly or according to recommendations, and advised to stay away from smoking. I have recommended yearly flu vaccine and pneumonia vaccination at least every 5 years; and  sleep for at least 7 hours a day.  - 25 minutes of time was spent on the care of this patient , 50% of which was applied for counseling on diabetes complications and their preventions.  - I advised patient to maintain close follow up with Purvis Kilts, MD for primary care needs.  Patient is asked to bring meter and  blood glucose logs during their next visit.   Follow up plan: -Return in about 3 months (around 03/25/2017) for meter, and logs.  Glade Lloyd, MD Phone: [phone]  Fax: [phone]   12/26/2016, 11:58 AM

## 2016-12-31 ENCOUNTER — Other Ambulatory Visit: Payer: Self-pay | Admitting: "Endocrinology

## 2017-01-11 DIAGNOSIS — I10 Essential (primary) hypertension: Secondary | ICD-10-CM | POA: Diagnosis not present

## 2017-01-11 DIAGNOSIS — E1142 Type 2 diabetes mellitus with diabetic polyneuropathy: Secondary | ICD-10-CM | POA: Diagnosis not present

## 2017-01-11 DIAGNOSIS — Z1389 Encounter for screening for other disorder: Secondary | ICD-10-CM | POA: Diagnosis not present

## 2017-01-11 DIAGNOSIS — E119 Type 2 diabetes mellitus without complications: Secondary | ICD-10-CM | POA: Diagnosis not present

## 2017-01-11 DIAGNOSIS — E782 Mixed hyperlipidemia: Secondary | ICD-10-CM | POA: Diagnosis not present

## 2017-01-11 DIAGNOSIS — E7220 Disorder of urea cycle metabolism, unspecified: Secondary | ICD-10-CM | POA: Diagnosis not present

## 2017-01-11 IMAGING — CT CT HEAD W/O CM
1 of 2 series · 16 of 30 positions shown, 20 images · non-contrast
Comparison: 03/10/2016

CLINICAL DATA: Increasing weakness x2 weeks

EXAM:
CT HEAD WITHOUT CONTRAST
TECHNIQUE: Contiguous axial images were obtained from the base of the skull
through the vertex without intravenous contrast.

[Series 3: headtrauma 2.4 h60s · axial · 0.43mm/px · z∈[+81,+241]mm · 16 of 72 slices shown, 20 images]
[im 4/72  brain]
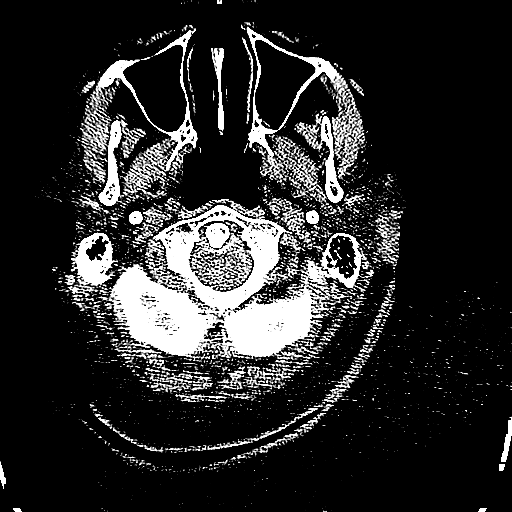
[im 4/72  bone]
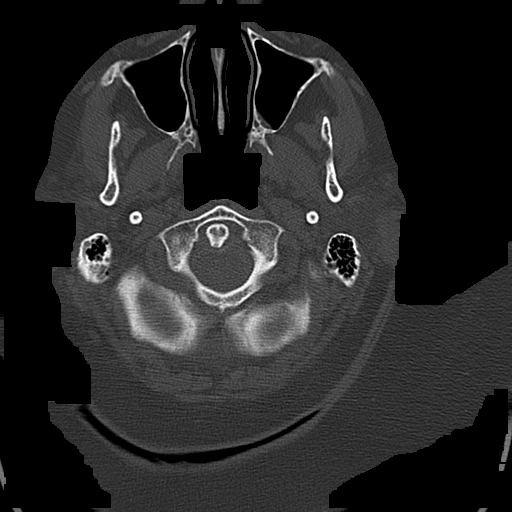
[im 8/72  brain]
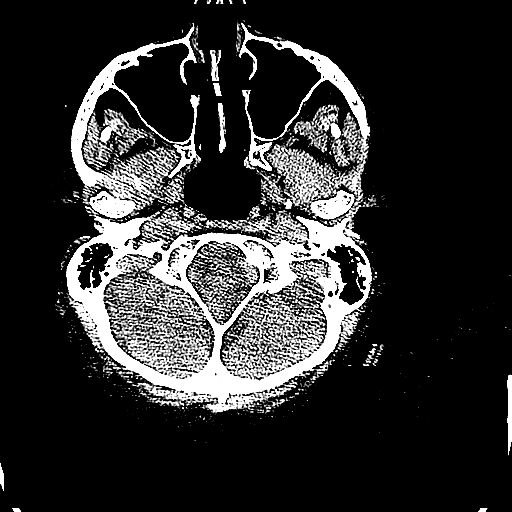
[im 12/72  brain]
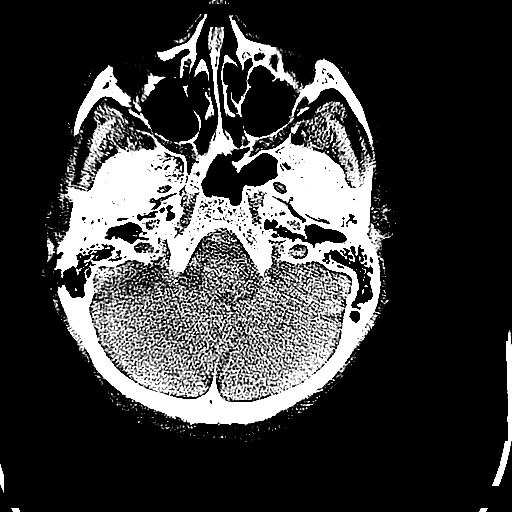
[im 15/72  brain]
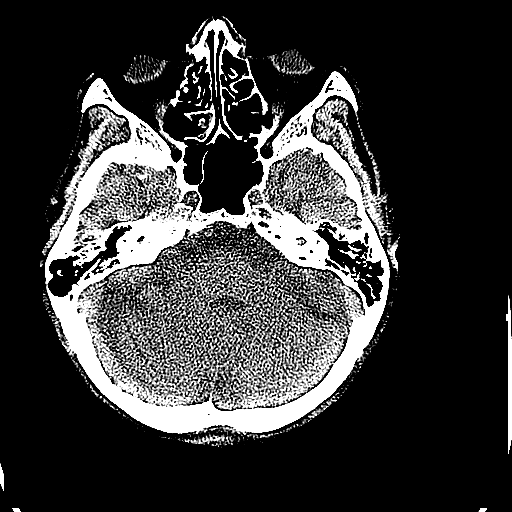
[im 23/72  brain]
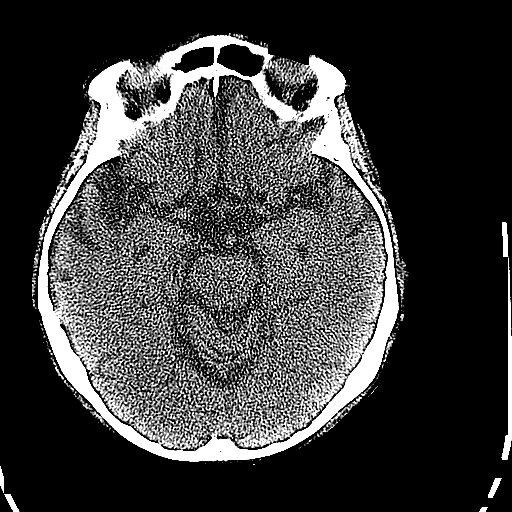
[im 23/72  bone]
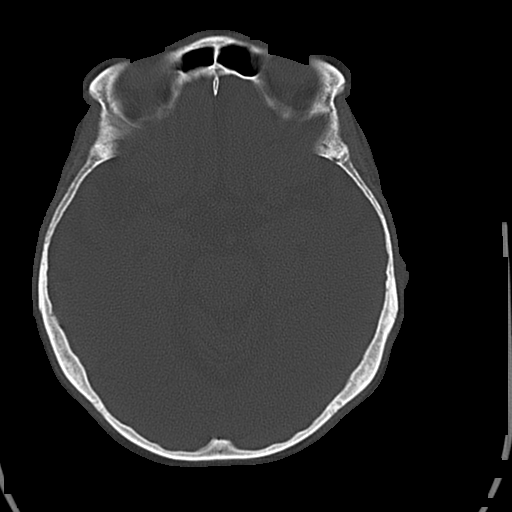
[im 27/72  brain]
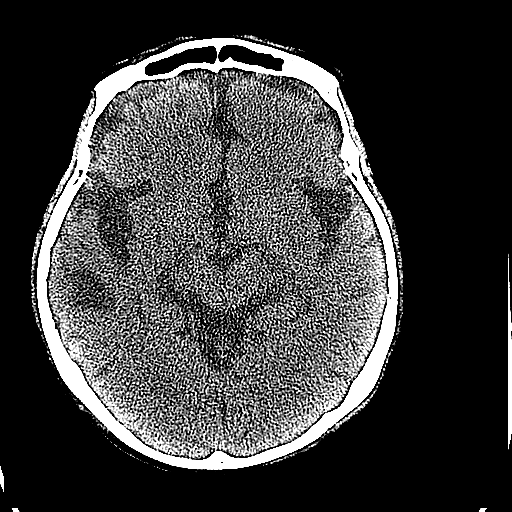
[im 30/72  brain]
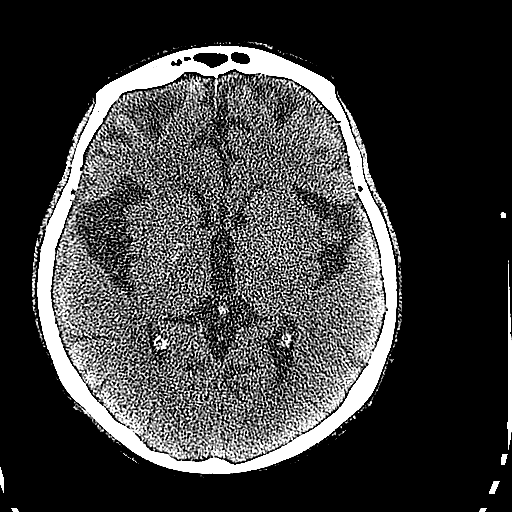
[im 34/72  brain]
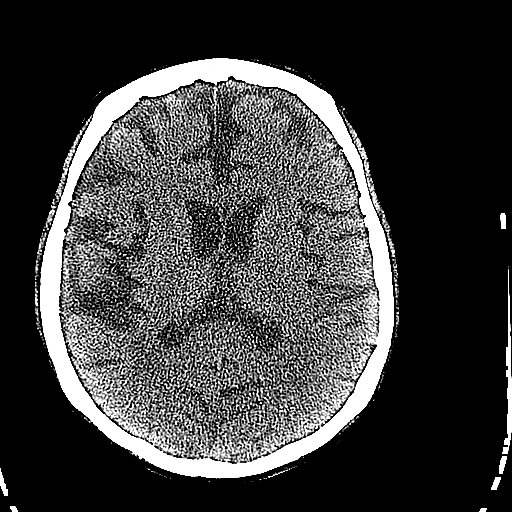
[im 38/72  brain]
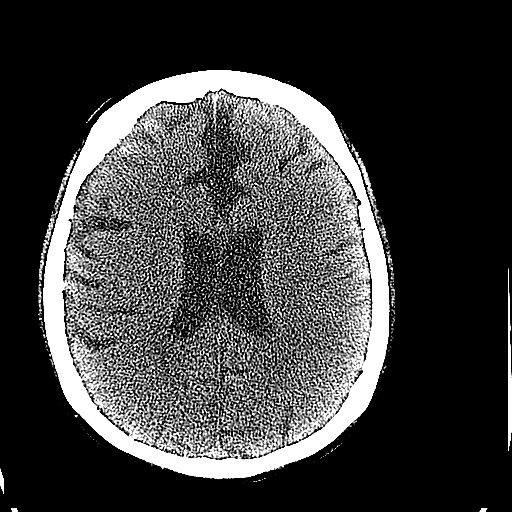
[im 38/72  bone]
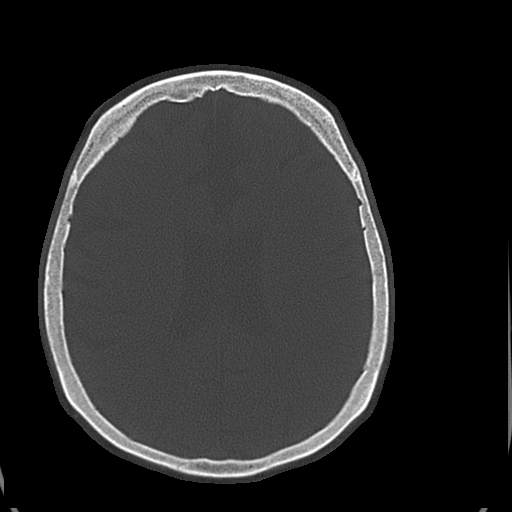
[im 42/72  brain]
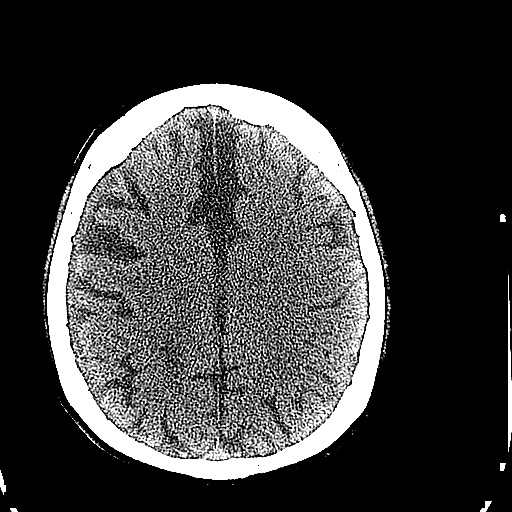
[im 45/72  brain]
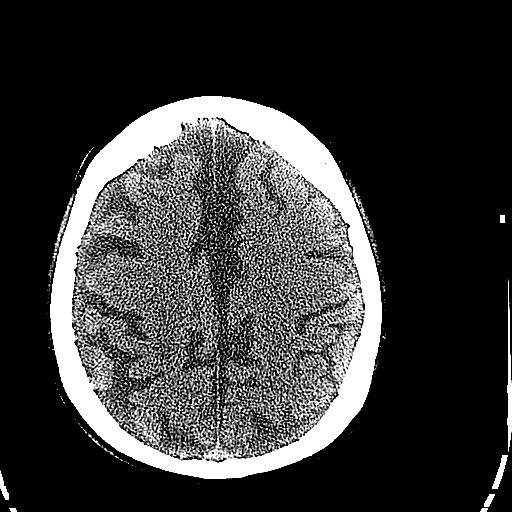
[im 49/72  brain]
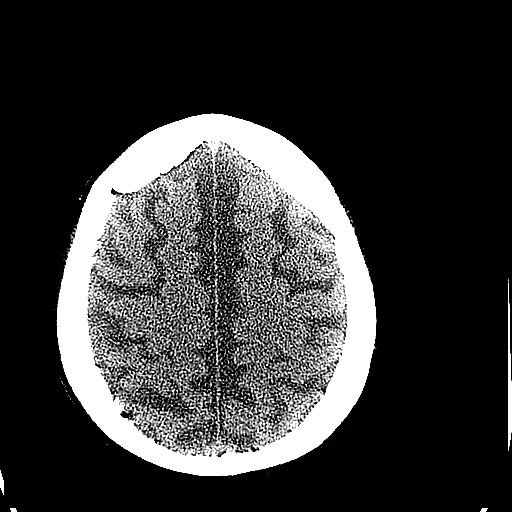
[im 57/72  brain]
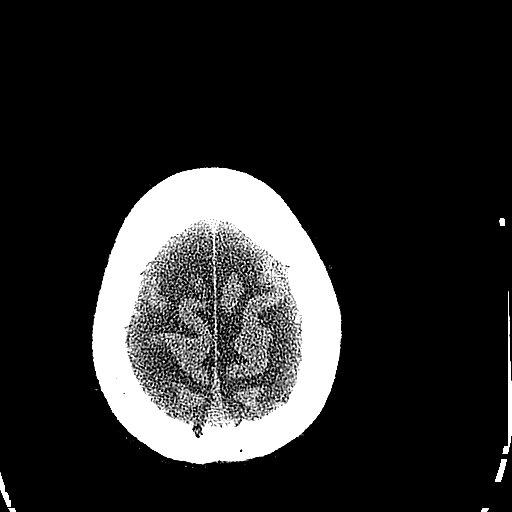
[im 57/72  bone]
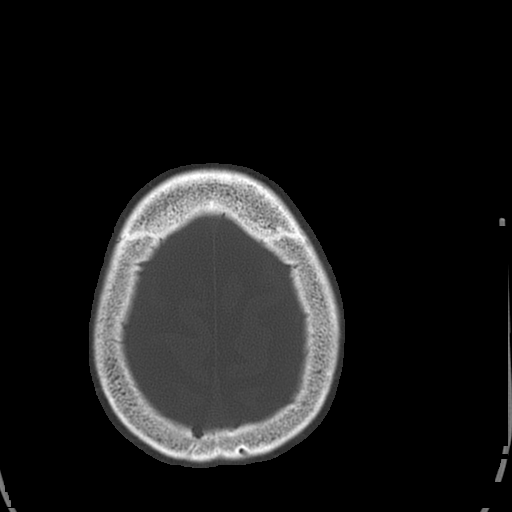
[im 60/72  brain]
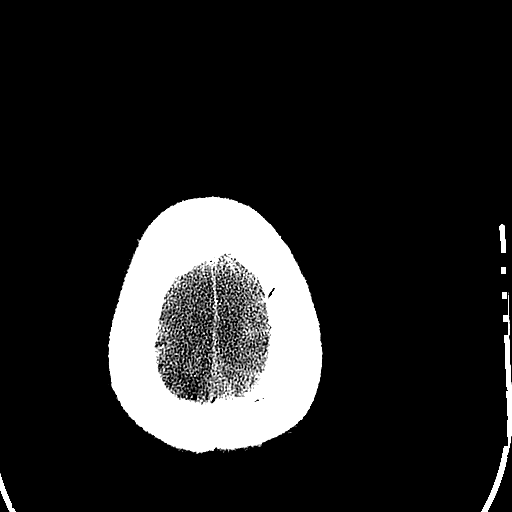
[im 64/72  brain]
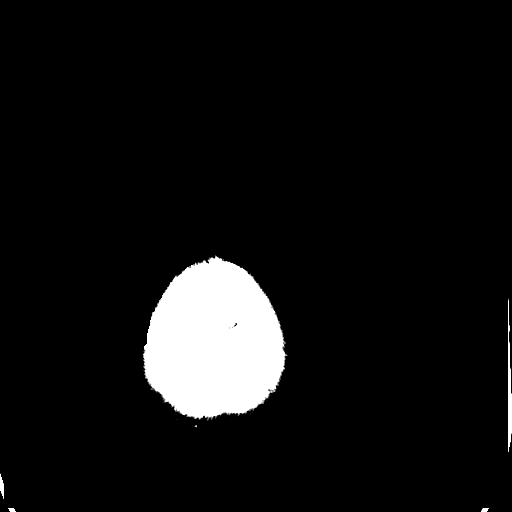
[im 68/72  brain]
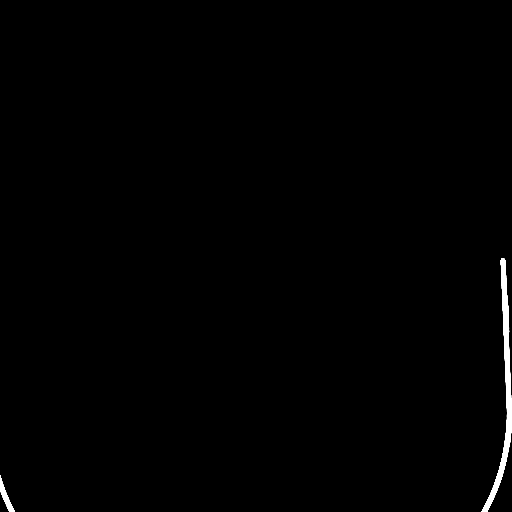

[16 of 30 positions shown; findings below may reference images not displayed]

FINDINGS: No evidence of parenchymal hemorrhage or extra-axial fluid
collection. No mass lesion, mass effect, or midline shift.

No CT evidence of acute infarction.

Subcortical white matter and periventricular small vessel ischemic
changes.

Right basal ganglia calcifications.

Global cortical atrophy.  No ventriculomegaly.

The visualized paranasal sinuses are essentially clear. The mastoid
air cells are unopacified.

No evidence of calvarial fracture.
IMPRESSION: No evidence of acute intracranial abnormality.

Atrophy with small vessel ischemic changes.

## 2017-01-17 DIAGNOSIS — M150 Primary generalized (osteo)arthritis: Secondary | ICD-10-CM | POA: Diagnosis not present

## 2017-01-17 DIAGNOSIS — J45909 Unspecified asthma, uncomplicated: Secondary | ICD-10-CM | POA: Diagnosis not present

## 2017-01-17 DIAGNOSIS — M169 Osteoarthritis of hip, unspecified: Secondary | ICD-10-CM | POA: Diagnosis not present

## 2017-01-21 DIAGNOSIS — K7460 Unspecified cirrhosis of liver: Secondary | ICD-10-CM | POA: Diagnosis not present

## 2017-01-21 DIAGNOSIS — B9620 Unspecified Escherichia coli [E. coli] as the cause of diseases classified elsewhere: Secondary | ICD-10-CM | POA: Diagnosis not present

## 2017-01-21 DIAGNOSIS — Z7982 Long term (current) use of aspirin: Secondary | ICD-10-CM | POA: Diagnosis not present

## 2017-01-21 DIAGNOSIS — D139 Benign neoplasm of ill-defined sites within the digestive system: Secondary | ICD-10-CM | POA: Diagnosis not present

## 2017-01-21 DIAGNOSIS — E119 Type 2 diabetes mellitus without complications: Secondary | ICD-10-CM | POA: Diagnosis not present

## 2017-01-21 DIAGNOSIS — R6520 Severe sepsis without septic shock: Secondary | ICD-10-CM | POA: Diagnosis not present

## 2017-01-21 DIAGNOSIS — Z7951 Long term (current) use of inhaled steroids: Secondary | ICD-10-CM | POA: Diagnosis not present

## 2017-01-21 DIAGNOSIS — N390 Urinary tract infection, site not specified: Secondary | ICD-10-CM | POA: Diagnosis not present

## 2017-01-21 DIAGNOSIS — I2510 Atherosclerotic heart disease of native coronary artery without angina pectoris: Secondary | ICD-10-CM | POA: Diagnosis not present

## 2017-01-21 DIAGNOSIS — E784 Other hyperlipidemia: Secondary | ICD-10-CM | POA: Diagnosis not present

## 2017-01-21 DIAGNOSIS — I10 Essential (primary) hypertension: Secondary | ICD-10-CM | POA: Diagnosis not present

## 2017-01-21 DIAGNOSIS — J45909 Unspecified asthma, uncomplicated: Secondary | ICD-10-CM | POA: Diagnosis not present

## 2017-01-22 ENCOUNTER — Emergency Department (HOSPITAL_COMMUNITY): Payer: Medicare Other

## 2017-01-22 ENCOUNTER — Encounter (HOSPITAL_COMMUNITY): Payer: Self-pay | Admitting: *Deleted

## 2017-01-22 ENCOUNTER — Inpatient Hospital Stay (HOSPITAL_COMMUNITY)
Admission: EM | Admit: 2017-01-22 | Discharge: 2017-01-26 | DRG: 193 | Disposition: A | Discharge status: Home / Self Care | Payer: Medicare Other | Attending: Family Medicine | Admitting: Family Medicine

## 2017-01-22 DIAGNOSIS — Z90710 Acquired absence of both cervix and uterus: Secondary | ICD-10-CM

## 2017-01-22 DIAGNOSIS — Z7982 Long term (current) use of aspirin: Secondary | ICD-10-CM

## 2017-01-22 DIAGNOSIS — K219 Gastro-esophageal reflux disease without esophagitis: Secondary | ICD-10-CM | POA: Diagnosis not present

## 2017-01-22 DIAGNOSIS — J189 Pneumonia, unspecified organism: Principal | ICD-10-CM

## 2017-01-22 DIAGNOSIS — R531 Weakness: Secondary | ICD-10-CM | POA: Diagnosis not present

## 2017-01-22 DIAGNOSIS — K7581 Nonalcoholic steatohepatitis (NASH): Secondary | ICD-10-CM | POA: Diagnosis not present

## 2017-01-22 DIAGNOSIS — Z794 Long term (current) use of insulin: Secondary | ICD-10-CM

## 2017-01-22 DIAGNOSIS — K2950 Unspecified chronic gastritis without bleeding: Secondary | ICD-10-CM | POA: Diagnosis not present

## 2017-01-22 DIAGNOSIS — K317 Polyp of stomach and duodenum: Secondary | ICD-10-CM | POA: Diagnosis not present

## 2017-01-22 DIAGNOSIS — I2510 Atherosclerotic heart disease of native coronary artery without angina pectoris: Secondary | ICD-10-CM | POA: Diagnosis present

## 2017-01-22 DIAGNOSIS — R600 Localized edema: Secondary | ICD-10-CM

## 2017-01-22 DIAGNOSIS — R402411 Glasgow coma scale score 13-15, in the field [EMT or ambulance]: Secondary | ICD-10-CM | POA: Diagnosis not present

## 2017-01-22 DIAGNOSIS — G609 Hereditary and idiopathic neuropathy, unspecified: Secondary | ICD-10-CM | POA: Diagnosis present

## 2017-01-22 DIAGNOSIS — J309 Allergic rhinitis, unspecified: Secondary | ICD-10-CM | POA: Diagnosis not present

## 2017-01-22 DIAGNOSIS — E785 Hyperlipidemia, unspecified: Secondary | ICD-10-CM | POA: Diagnosis present

## 2017-01-22 DIAGNOSIS — K5730 Diverticulosis of large intestine without perforation or abscess without bleeding: Secondary | ICD-10-CM | POA: Diagnosis not present

## 2017-01-22 DIAGNOSIS — I119 Hypertensive heart disease without heart failure: Secondary | ICD-10-CM | POA: Diagnosis not present

## 2017-01-22 DIAGNOSIS — K7460 Unspecified cirrhosis of liver: Secondary | ICD-10-CM | POA: Diagnosis not present

## 2017-01-22 DIAGNOSIS — Z7401 Bed confinement status: Secondary | ICD-10-CM

## 2017-01-22 DIAGNOSIS — I69354 Hemiplegia and hemiparesis following cerebral infarction affecting left non-dominant side: Secondary | ICD-10-CM | POA: Diagnosis not present

## 2017-01-22 DIAGNOSIS — F0390 Unspecified dementia without behavioral disturbance: Secondary | ICD-10-CM | POA: Diagnosis present

## 2017-01-22 DIAGNOSIS — Z8719 Personal history of other diseases of the digestive system: Secondary | ICD-10-CM

## 2017-01-22 DIAGNOSIS — J209 Acute bronchitis, unspecified: Secondary | ICD-10-CM | POA: Diagnosis not present

## 2017-01-22 DIAGNOSIS — M79601 Pain in right arm: Secondary | ICD-10-CM | POA: Diagnosis not present

## 2017-01-22 DIAGNOSIS — F419 Anxiety disorder, unspecified: Secondary | ICD-10-CM | POA: Diagnosis present

## 2017-01-22 DIAGNOSIS — I10 Essential (primary) hypertension: Secondary | ICD-10-CM | POA: Diagnosis not present

## 2017-01-22 DIAGNOSIS — E0840 Diabetes mellitus due to underlying condition with diabetic neuropathy, unspecified: Secondary | ICD-10-CM | POA: Diagnosis not present

## 2017-01-22 DIAGNOSIS — Z8249 Family history of ischemic heart disease and other diseases of the circulatory system: Secondary | ICD-10-CM | POA: Diagnosis not present

## 2017-01-22 DIAGNOSIS — Z885 Allergy status to narcotic agent status: Secondary | ICD-10-CM

## 2017-01-22 DIAGNOSIS — E1140 Type 2 diabetes mellitus with diabetic neuropathy, unspecified: Secondary | ICD-10-CM | POA: Diagnosis present

## 2017-01-22 DIAGNOSIS — A419 Sepsis, unspecified organism: Secondary | ICD-10-CM | POA: Diagnosis not present

## 2017-01-22 DIAGNOSIS — J9601 Acute respiratory failure with hypoxia: Secondary | ICD-10-CM | POA: Diagnosis not present

## 2017-01-22 DIAGNOSIS — Z7951 Long term (current) use of inhaled steroids: Secondary | ICD-10-CM

## 2017-01-22 DIAGNOSIS — Z833 Family history of diabetes mellitus: Secondary | ICD-10-CM | POA: Diagnosis not present

## 2017-01-22 DIAGNOSIS — J40 Bronchitis, not specified as acute or chronic: Secondary | ICD-10-CM | POA: Diagnosis not present

## 2017-01-22 DIAGNOSIS — R7881 Bacteremia: Secondary | ICD-10-CM | POA: Diagnosis not present

## 2017-01-22 DIAGNOSIS — E669 Obesity, unspecified: Secondary | ICD-10-CM | POA: Diagnosis present

## 2017-01-22 DIAGNOSIS — R509 Fever, unspecified: Secondary | ICD-10-CM | POA: Diagnosis not present

## 2017-01-22 DIAGNOSIS — Z79899 Other long term (current) drug therapy: Secondary | ICD-10-CM

## 2017-01-22 DIAGNOSIS — Z888 Allergy status to other drugs, medicaments and biological substances status: Secondary | ICD-10-CM

## 2017-01-22 DIAGNOSIS — E1142 Type 2 diabetes mellitus with diabetic polyneuropathy: Secondary | ICD-10-CM | POA: Diagnosis not present

## 2017-01-22 LAB — URINALYSIS, ROUTINE W REFLEX MICROSCOPIC
BILIRUBIN URINE: NEGATIVE
Glucose, UA: 50 mg/dL — AB
HGB URINE DIPSTICK: NEGATIVE
KETONES UR: 5 mg/dL — AB
Leukocytes, UA: NEGATIVE
NITRITE: NEGATIVE
PROTEIN: NEGATIVE mg/dL
Specific Gravity, Urine: 1.031 — ABNORMAL HIGH (ref 1.005–1.030)
pH: 5 (ref 5.0–8.0)

## 2017-01-22 LAB — PROTIME-INR
INR: 1.23
Prothrombin Time: 15.6 seconds — ABNORMAL HIGH (ref 11.4–15.2)

## 2017-01-22 LAB — CBC WITH DIFFERENTIAL/PLATELET
BASOS PCT: 0 %
Basophils Absolute: 0 10*3/uL (ref 0.0–0.1)
Eosinophils Absolute: 0.1 10*3/uL (ref 0.0–0.7)
Eosinophils Relative: 1 %
HCT: 39.2 % (ref 36.0–46.0)
Hemoglobin: 13.2 g/dL (ref 12.0–15.0)
LYMPHS ABS: 1.2 10*3/uL (ref 0.7–4.0)
Lymphocytes Relative: 13 %
MCH: 28.1 pg (ref 26.0–34.0)
MCHC: 33.7 g/dL (ref 30.0–36.0)
MCV: 83.4 fL (ref 78.0–100.0)
MONO ABS: 0.8 10*3/uL (ref 0.1–1.0)
MONOS PCT: 9 %
NEUTROS ABS: 6.9 10*3/uL (ref 1.7–7.7)
Neutrophils Relative %: 77 %
Platelets: 176 10*3/uL (ref 150–400)
RBC: 4.7 MIL/uL (ref 3.87–5.11)
RDW: 16.2 % — AB (ref 11.5–15.5)
WBC: 9 10*3/uL (ref 4.0–10.5)

## 2017-01-22 LAB — COMPREHENSIVE METABOLIC PANEL
ALBUMIN: 3 g/dL — AB (ref 3.5–5.0)
ALK PHOS: 119 U/L (ref 38–126)
ALT: 12 U/L — AB (ref 14–54)
ANION GAP: 9 (ref 5–15)
AST: 20 U/L (ref 15–41)
BUN: 16 mg/dL (ref 6–20)
CALCIUM: 8.3 mg/dL — AB (ref 8.9–10.3)
CO2: 23 mmol/L (ref 22–32)
CREATININE: 0.7 mg/dL (ref 0.44–1.00)
Chloride: 106 mmol/L (ref 101–111)
GFR calc Af Amer: >60 mL/min (ref >60)
GFR calc non Af Amer: >60 mL/min (ref >60)
GLUCOSE: 196 mg/dL — AB (ref 65–99)
Potassium: 3.8 mmol/L (ref 3.5–5.1)
SODIUM: 138 mmol/L (ref 135–145)
Total Bilirubin: 1.9 mg/dL — ABNORMAL HIGH (ref 0.3–1.2)
Total Protein: 6.1 g/dL — ABNORMAL LOW (ref 6.5–8.1)

## 2017-01-22 LAB — INFLUENZA PANEL BY PCR (TYPE A & B)
INFLAPCR: NEGATIVE
Influenza B By PCR: NEGATIVE

## 2017-01-22 LAB — AMMONIA: AMMONIA: 38 umol/L — AB (ref 9–35)

## 2017-01-22 LAB — I-STAT CG4 LACTIC ACID, ED: Lactic Acid, Venous: 2.3 mmol/L (ref 0.5–1.9)

## 2017-01-22 LAB — BRAIN NATRIURETIC PEPTIDE: B NATRIURETIC PEPTIDE 5: 48 pg/mL (ref 0.0–100.0)

## 2017-01-22 MED ORDER — ONDANSETRON HCL 4 MG PO TABS
4.0000 mg | ORAL_TABLET | Freq: Four times a day (QID) | ORAL | Status: DC | PRN
  Administered 2017-01-26: 4 mg via ORAL
  Filled 2017-01-22: qty 1

## 2017-01-22 MED ORDER — ALBUTEROL SULFATE (2.5 MG/3ML) 0.083% IN NEBU: 2.5000 mg | INHALATION_SOLUTION | Freq: Four times a day (QID) | RESPIRATORY_TRACT | Status: DC

## 2017-01-22 MED ORDER — SODIUM CHLORIDE 0.9 % IV BOLUS (SEPSIS)
2000.0000 mL | Freq: Once | INTRAVENOUS | Status: AC
  Administered 2017-01-22: 2000 mL via INTRAVENOUS

## 2017-01-22 MED ORDER — METHYLPREDNISOLONE SODIUM SUCC 125 MG IJ SOLR
60.0000 mg | Freq: Four times a day (QID) | INTRAMUSCULAR | Status: DC
  Administered 2017-01-22 – 2017-01-23 (×3): 60 mg via INTRAVENOUS
  Filled 2017-01-22 (×4): qty 2

## 2017-01-22 MED ORDER — SODIUM CHLORIDE 0.9 % IV SOLN: INTRAVENOUS | Status: DC

## 2017-01-22 MED ORDER — GUAIFENESIN ER 600 MG PO TB12
600.0000 mg | ORAL_TABLET | Freq: Two times a day (BID) | ORAL | Status: DC
  Administered 2017-01-23 – 2017-01-26 (×8): 600 mg via ORAL
  Filled 2017-01-22 (×8): qty 1

## 2017-01-22 MED ORDER — VANCOMYCIN HCL IN DEXTROSE 1-5 GM/200ML-% IV SOLN
1000.0000 mg | Freq: Once | INTRAVENOUS | Status: AC
  Administered 2017-01-22: 1000 mg via INTRAVENOUS
  Filled 2017-01-22: qty 200

## 2017-01-22 MED ORDER — PANTOPRAZOLE SODIUM 40 MG PO TBEC
40.0000 mg | DELAYED_RELEASE_TABLET | Freq: Every day | ORAL | Status: DC
  Administered 2017-01-23 – 2017-01-26 (×4): 40 mg via ORAL
  Filled 2017-01-22 (×4): qty 1

## 2017-01-22 MED ORDER — ORAL CARE MOUTH RINSE
15.0000 mL | Freq: Two times a day (BID) | OROMUCOSAL | Status: DC
  Administered 2017-01-23 – 2017-01-25 (×6): 15 mL via OROMUCOSAL

## 2017-01-22 MED ORDER — PIPERACILLIN-TAZOBACTAM 3.375 G IVPB 30 MIN
3.3750 g | Freq: Once | INTRAVENOUS | Status: AC
  Administered 2017-01-22: 3.375 g via INTRAVENOUS
  Filled 2017-01-22: qty 50

## 2017-01-22 MED ORDER — ONDANSETRON HCL 4 MG/2ML IJ SOLN: 4.0000 mg | Freq: Four times a day (QID) | INTRAMUSCULAR | Status: DC | PRN

## 2017-01-22 MED ORDER — INSULIN ASPART 100 UNIT/ML ~~LOC~~ SOLN
0.0000 [IU] | Freq: Three times a day (TID) | SUBCUTANEOUS | Status: DC
  Administered 2017-01-23: 7 [IU] via SUBCUTANEOUS
  Administered 2017-01-23 – 2017-01-24 (×4): 5 [IU] via SUBCUTANEOUS

## 2017-01-22 MED ORDER — ASPIRIN EC 81 MG PO TBEC
81.0000 mg | DELAYED_RELEASE_TABLET | Freq: Every day | ORAL | Status: DC
  Administered 2017-01-23 – 2017-01-26 (×4): 81 mg via ORAL
  Filled 2017-01-22 (×4): qty 1

## 2017-01-22 MED ORDER — IPRATROPIUM BROMIDE 0.02 % IN SOLN: 0.5000 mg | Freq: Four times a day (QID) | RESPIRATORY_TRACT | Status: DC

## 2017-01-22 MED ORDER — ENALAPRIL MALEATE 5 MG PO TABS
5.0000 mg | ORAL_TABLET | Freq: Every day | ORAL | Status: DC
  Administered 2017-01-23 – 2017-01-26 (×4): 5 mg via ORAL
  Filled 2017-01-22 (×4): qty 1

## 2017-01-22 MED ORDER — LACTULOSE 10 GM/15ML PO SOLN
20.0000 g | Freq: Three times a day (TID) | ORAL | Status: DC
  Administered 2017-01-23 – 2017-01-26 (×9): 20 g via ORAL
  Filled 2017-01-22 (×10): qty 30

## 2017-01-22 MED ORDER — PREGABALIN 50 MG PO CAPS
100.0000 mg | ORAL_CAPSULE | Freq: Two times a day (BID) | ORAL | Status: DC
  Administered 2017-01-23 – 2017-01-26 (×8): 100 mg via ORAL
  Filled 2017-01-22 (×8): qty 2

## 2017-01-22 MED ORDER — DONEPEZIL HCL 5 MG PO TABS
5.0000 mg | ORAL_TABLET | Freq: Every day | ORAL | Status: DC
  Administered 2017-01-23 – 2017-01-25 (×4): 5 mg via ORAL
  Filled 2017-01-22 (×4): qty 1

## 2017-01-22 MED ORDER — ENOXAPARIN SODIUM 40 MG/0.4ML ~~LOC~~ SOLN
40.0000 mg | SUBCUTANEOUS | Status: DC
  Administered 2017-01-23 (×2): 40 mg via SUBCUTANEOUS
  Filled 2017-01-22 (×2): qty 0.4

## 2017-01-22 MED ORDER — RIFAXIMIN 550 MG PO TABS
550.0000 mg | ORAL_TABLET | Freq: Three times a day (TID) | ORAL | Status: DC
  Administered 2017-01-23 – 2017-01-26 (×11): 550 mg via ORAL
  Filled 2017-01-22 (×11): qty 1

## 2017-01-22 MED ORDER — SPIRONOLACTONE 25 MG PO TABS
50.0000 mg | ORAL_TABLET | Freq: Every day | ORAL | Status: DC
  Administered 2017-01-23 – 2017-01-26 (×4): 50 mg via ORAL
  Filled 2017-01-22 (×4): qty 2

## 2017-01-22 MED ORDER — URSODIOL 300 MG PO CAPS
600.0000 mg | ORAL_CAPSULE | Freq: Two times a day (BID) | ORAL | Status: DC
  Administered 2017-01-23 – 2017-01-26 (×7): 600 mg via ORAL
  Filled 2017-01-22 (×11): qty 2

## 2017-01-22 NOTE — ED Provider Notes (Signed)
[facility] DEPT Provider Note   CSN: [id number] Arrival date & time: 01/22/17  1931     History   Chief Complaint Chief Complaint  Patient presents with  . Fever    HPI [name] [name] is a 76 y.o. female.  Patient complains of a cough for a few days yellow sputum production and fever   The history is provided by the patient. No language interpreter was used.  Fever   This is a new problem. The problem occurs constantly. The problem has not changed since onset.The maximum temperature noted was 100 to 100.9 F. Associated symptoms include cough. Pertinent negatives include no chest pain, no diarrhea, no congestion and no headaches. She has tried nothing for the symptoms. The treatment provided no relief.    Past Medical History:  Diagnosis Date  . Abnormal EKG    hx of left anterior fasicular block on 04-09-13 ekg epic  . Allergic rhinitis, cause unspecified   . Bilateral leg weakness   . CAD in native artery   . Chronic gastritis 03/09/11   egd by Dr. Gala Romney  . Chronic shoulder pain   . Cirrhosis ([place])   . Cirrhosis of liver (HCC)    NASH, afp on 06/17/12 =3.7, per pt she had hep B vaccines in 1996, hep A in process.   . Colon adenomas 03/08/10   tcs by Dr. Gala Romney  . Diverticula of colon 03/08/10   L side  . Esophagitis, erosive 03/08/10  . GERD (gastroesophageal reflux disease)   . History of hemorrhoids 03/08/10   tcs- internal and external  . Hyperplastic polyps of stomach 03/08/10   tcs by Dr. Dudley Major  . Intrinsic asthma, unspecified   . Obesity, unspecified   . Other and unspecified hyperlipidemia   . Pancreatitis   . Pancytopenia, acquired ([place]) 04/12/2014  . Personal history of neurosis   . Thrombocytopenia ([place]) 04/12/2014  . Thrombocytopenia, unspecified   . Type II or unspecified type diabetes mellitus without mention of complication, not stated as uncontrolled   . Unspecified essential hypertension   . Unspecified fall   . Unspecified hereditary and idiopathic  peripheral neuropathy   . Vertigo   . Wears glasses     Patient Active Problem List   Diagnosis Date Noted  . E-coli UTI 10/23/2016  . Muscle weakness (generalized)   . Severe sepsis ([place]) 10/19/2016  . Lactic acidosis 10/19/2016  . Leg edema, left 09/28/2016  . Encephalopathy 08/03/2016  . Hepatic encephalopathy ([place])   . Leukopenia 08/02/2016  . 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 Lower urinary tract infectious disease   . Fall 12/24/2014  . Encounter for examination for admission to nursing home 12/16/2014  . Hyperammonemia ([place]) 12/16/2014  . Weakness 12/15/2014  . Physical deconditioning 08/18/2014  . Morbid obesity ([place]) 08/18/2014  . Elevated LFTs 07/29/2014  . Left-sided weakness 07/12/2014  . Pancreatitis, acute 04/12/2014  . Pancytopenia, acquired ([place]) 04/12/2014  . Poor balance 10/14/2013  . Acute encephalopathy 07/30/2013  . Encephalopathy, hepatic ([place]) 07/30/2013  . Confusion 07/29/2013  . Edema of left lower extremity 04/14/2013  . Generalized weakness 02/19/2013  . Urinary tract infection without hematuria 02/14/2013  . Fracture of humeral shaft, right, closed 02/11/2013  . Multiple falls 01/26/2013  . Difficulty walking 01/26/2013  . Dizziness 09/02/2012  . Frequent falls 08/08/2012  . Ataxia 08/08/2012  . Peripheral neuropathy ([place]) 08/08/2012  . 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 Normocytic anemia 04/08/2012  .  Posterior tibial tendon dysfunction 07/12/2011  . Mixed hyperlipidemia 05/31/2011  . Essential hypertension 05/31/2011  . GERD 09/29/2010  . Liver cirrhosis secondary to NASH ([place]) 09/29/2010  . History of colonic polyps 02/13/2010  . Thrombocytopenia ([place]) 01/04/2010  . Hereditary and idiopathic peripheral neuropathy 01/04/2010  . CAD, NATIVE VESSEL 01/04/2010  . ANXIETY DISORDER, HX OF 01/04/2010  . Intrinsic asthma, unspecified 03/16/2009  . ALLERGIC RHINITIS 02/18/2009    Past Surgical  History:  Procedure Laterality Date  . ABDOMINAL HYSTERECTOMY    . BREAST SURGERY Right few yrs ago   benign area removed  . CARDIAC CATHETERIZATION  02/25/2007   Dr. [name]:  normal LV systolic function, mild irregularities of LAD  . COLONOSCOPY  03/08/10   Dr. [name]-->ext/int hemorrhoids, anal paipilla, rectal polyps, desc polyps, cecal polyp, left-sided diverticula. suboptiomal prep. next TCS 02/2015. multiple adenomas  . ESOPHAGOGASTRODUODENOSCOPY  03/08/10   Dr. [name] erosive RE, small hh, antral erosions, small 1cm are of mucosal indentation along gastric body of doubtful significance, cystic nodularity of hypophyarynx, base of tongue, base of epiglottis. benign gastric biopsies  . ESOPHAGOGASTRODUODENOSCOPY  10/08/2011   Dr. [name] esophageal varices, antral erosion. next egd 09/2013  . ESOPHAGOGASTRODUODENOSCOPY N/A 02/17/2014   Dr. Gala Romney: portal gastropathy, no varices. Due for surveillance 2017  . EYE SURGERY     cataracts bilateral  . FOOT SURGERY  2003   lt foot  . hysterectomy and btl     s/p  . ORIF HUMERUS FRACTURE Right 02/17/2013   Procedure: OPEN REDUCTION INTERNAL FIXATION (ORIF) RIGHT PROXIMAL HUMERUS FRACTURE;  Surgeon: [name], MD;  Location: [facility];  Service: Orthopedics;  Laterality: Right;  . PARTIAL GASTRECTOMY     family denies  . TUBAL LIGATION    . TUMOR EXCISION  2003   rt arm and left foot    OB History    No data available       Home Medications    Prior to Admission medications   Medication Sig Start Date End Date Taking? Authorizing Provider  ALPRAZolam Duanne Moron) 0.5 MG tablet Take 1 tablet (0.5 mg total) by mouth 2 (two) times daily. 09/30/16  Yes Rexene Alberts, MD  aspirin EC 81 MG tablet Take 81 mg by mouth daily.   Yes Historical Provider, MD  budesonide-formoterol (SYMBICORT) 160-4.5 MCG/ACT inhaler Inhale 2 puffs into the lungs at bedtime. 08/06/16  Yes Kathie Dike, MD  diphenhydrAMINE (BENADRYL) 25 MG tablet Take 25 mg by  mouth every 6 (six) hours as needed.   Yes Historical Provider, MD  donepezil (ARICEPT) 5 MG tablet Take 5 mg by mouth at bedtime.   Yes Historical Provider, MD  enalapril (VASOTEC) 5 MG tablet Take 1 tablet by mouth daily. 08/25/14  Yes Historical Provider, MD  furosemide (LASIX) 20 MG tablet Take 2 tablets (40 mg total) by mouth daily. Patient taking differently: Take 20 mg by mouth every morning.  10/23/16  Yes Nishant Dhungel, MD  HUMALOG KWIKPEN 100 UNIT/ML KiwkPen INJECT 10-16 UNITS INTO THE SKIN THREE TIMES DAILY 10/08/16  Yes Cassandria Anger, MD  lactulose (CHRONULAC) 10 GM/15ML solution Take 20 g by mouth 3 (three) times daily.  10/20/14  Yes Historical Provider, MD  LEVEMIR FLEXTOUCH 100 UNIT/ML Pen INJECT 50 UNITS INTO THE SKIN DAILY AT 10PM Patient taking differently: INJECT 40 UNITS INTO THE SKIN DAILY AT 10PM 12/31/16  Yes Cassandria Anger, MD  meclizine (ANTIVERT) 25 MG tablet Take 25 mg by mouth 3 (three) times daily  as needed for dizziness.  10/17/16  Yes Historical Provider, MD  NEXIUM 40 MG capsule Take 40 mg by mouth every morning.  06/17/14  Yes Historical Provider, MD  oxycodone (OXY-IR) 5 MG capsule Take 5 mg by mouth every 4 (four) hours as needed for pain.   Yes Historical Provider, MD  pregabalin (LYRICA) 100 MG capsule Take 100 mg by mouth 2 (two) times daily.   Yes Historical Provider, MD  rifaximin (XIFAXAN) 550 MG TABS tablet Take 1 tablet (550 mg total) by mouth 2 (two) times daily. Patient taking differently: Take 550 mg by mouth 3 (three) times daily.  06/28/14  Yes Kathie Dike, MD  spironolactone (ALDACTONE) 50 MG tablet TAKE ONE TABLET BY MOUTH ONCE DAILY IN THE MORNING 03/19/16  Yes Mahala Menghini, PA-C  triamcinolone cream (KENALOG) 0.1 % Apply 1 application topically daily as needed (for irritation).  06/26/13  Yes Historical Provider, MD  ursodiol (URSO FORTE) 500 MG tablet Take 500 mg by mouth 2 (two) times daily.   Yes Historical Provider, MD  Vitamin D,  Ergocalciferol, (DRISDOL) 50000 units CAPS capsule Take 50,000 Units by mouth every Wednesday.   Yes Historical Provider, MD  [facility] VERIO test strip USE UP TO FOUR TIMES DAILY AS DIRECTED 07/02/16   Cassandria Anger, MD    Family History Family History  Problem Relation Age of Onset  . Coronary artery disease      FH  . Diabetes      FH  . Heart disease Mother   . Colon cancer Neg Hx     Social History Social History  Substance Use Topics  . Smoking status: Never Smoker  . Smokeless tobacco: Never Used  . Alcohol use No     Allergies   Morphine and related and Compazine [prochlorperazine edisylate]   Review of Systems Review of Systems  Constitutional: Positive for fever. Negative for appetite change and fatigue.  HENT: Negative for congestion, ear discharge and sinus pressure.   Eyes: Negative for discharge.  Respiratory: Positive for cough.   Cardiovascular: Negative for chest pain.  Gastrointestinal: Negative for abdominal pain and diarrhea.  Genitourinary: Negative for frequency and hematuria.  Musculoskeletal: Negative for back pain.  Skin: Negative for rash.  Neurological: Negative for seizures and headaches.  Psychiatric/Behavioral: Negative for hallucinations.     Physical Exam Updated Vital Signs BP 139/63 (BP Location: Left Arm)   Pulse 86   Temp 100.7 F (38.2 C) (Oral)   Resp 24   Ht 5\' 9"  (1.753 m)   Wt 270 lb (122.5 kg)   SpO2 97%   BMI 39.87 kg/m   Physical Exam  Constitutional: She is oriented to person, place, and time. She appears well-developed.  HENT:  Head: Normocephalic.  Eyes: Conjunctivae and EOM are normal. No scleral icterus.  Neck: Neck supple. No thyromegaly present.  Cardiovascular: Normal rate and regular rhythm.  Exam reveals no gallop and no friction rub.   No murmur heard. Pulmonary/Chest: No stridor. She has no wheezes. She has rales. She exhibits no tenderness.  Abdominal: She exhibits no distension. There is  no tenderness. There is no rebound.  Musculoskeletal: Normal range of motion. She exhibits no edema.  Lymphadenopathy:    She has no cervical adenopathy.  Neurological: She is oriented to person, place, and time. She exhibits normal muscle tone. Coordination normal.  Skin: No rash noted. No erythema.  Psychiatric: She has a normal mood and affect. Her behavior is normal.  ED Treatments / Results  Labs (all labs ordered are listed, but only abnormal results are displayed) Labs Reviewed  COMPREHENSIVE METABOLIC PANEL - Abnormal; Notable for the following:       Result Value   Glucose, Bld 196 (*)    Calcium 8.3 (*)    Total Protein 6.1 (*)    Albumin 3.0 (*)    ALT 12 (*)    Total Bilirubin 1.9 (*)    All other components within normal limits  CBC WITH DIFFERENTIAL/PLATELET - Abnormal; Notable for the following:    RDW 16.2 (*)    All other components within normal limits  AMMONIA - Abnormal; Notable for the following:    Ammonia 38 (*)    All other components within normal limits  I-STAT CG4 LACTIC ACID, ED - Abnormal; Notable for the following:    Lactic Acid, Venous 2.30 (*)    All other components within normal limits  CULTURE, BLOOD (ROUTINE X 2)  CULTURE, BLOOD (ROUTINE X 2)  BRAIN NATRIURETIC PEPTIDE  URINALYSIS, ROUTINE W REFLEX MICROSCOPIC  INFLUENZA PANEL BY PCR (TYPE A & B)    EKG  EKG Interpretation  Date/Time:  Tuesday January 22 2017 20:07:55 EDT Ventricular Rate:  85 PR Interval:    QRS Duration: 118 QT Interval:  378 QTC Calculation: 450 R Axis:   -140 Text Interpretation:  Sinus rhythm Atrial premature complex Nonspecific intraventricular conduction delay Nonspecific T abnrm, anterolateral leads Confirmed by Lilas Diefendorf  MD, Anida Deol [phone]) on 01/22/2017 8:18:26 PM       Radiology Dg Chest Portable 1 View  Result Date: 01/22/2017 CLINICAL DATA:  Code sepsis, Pt brought in by [facility] for c/o increased congestion and fever for the last couple of days;  EXAM: PORTABLE CHEST 1 VIEW COMPARISON:  10/19/2016 FINDINGS: Oblique, minimally motion degraded lateral view. Proximal right humerus fixation. Moderate cardiomegaly. No right-sided pleural fluid. Left pleural space not well evaluated. No pneumothorax. No overt congestive failure. Low lung volumes with resultant pulmonary interstitial prominence. Mild right base volume loss and subsegmental atelectasis. Left lung base not well evaluated. IMPRESSION: Cardiomegaly and low lung volumes. Multifactorial degradation, with the inferior left hemithorax especially not well evaluated. Given this factor, no definite acute disease. Electronically Signed   By: Abigail Miyamoto M.D.   On: 01/22/2017 20:08    Procedures Procedures (including critical care time)  Medications Ordered in ED Medications  vancomycin (VANCOCIN) IVPB 1000 mg/200 mL premix (1,000 mg Intravenous New Bag/Given 01/22/17 2030)  piperacillin-tazobactam (ZOSYN) IVPB 3.375 g (not administered)  sodium chloride 0.9 % bolus 2,000 mL (2,000 mLs Intravenous New Bag/Given 01/22/17 2030)     Initial Impression / Assessment and Plan / ED Course  I have reviewed the triage vital signs and the nursing notes.  Pertinent labs & imaging results that were available during my care of the patient were reviewed by me and considered in my medical decision making (see chart for details).     Patient with fever cough yellow sputum production no obvious pneumonia on chest x-ray. Labs unremarkable. Patient will be admitted to observation put on antibiotics for possible pneumonia  Final Clinical Impressions(s) / ED Diagnoses   Final diagnoses:  Febrile illness    New Prescriptions New Prescriptions   No medications on file     Milton Ferguson, MD 01/22/17 2116

## 2017-01-22 NOTE — ED Triage Notes (Signed)
Pt brought in by [facility] for c/o increased congestion and fever for the last couple of days; family unable to give ems an adequate history; CBG 187

## 2017-01-22 NOTE — H&P (Signed)
TRH H&P    Patient Demographics:    [name] [name], is a 76 y.o. female  MRN: [id number]  DOB - [birth year]  Admit Date - 01/22/2017  Referring MD/NP/PA: Dr. Roderic Palau  Outpatient Primary MD for the patient is Purvis Kilts, MD  Patient coming from: Home  Chief Complaint  Patient presents with  . Fever      HPI:    [name] [name]  is a 76 y.o. 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 Patient was seen by PCP around 10 days ago and has since gradually declined. The patient was so lethargic that she was not as responsive as per family members.  Patient is lethargic but arousable, she is a poor historian. As per patient's husband she has been coughing up yellow colored phlegm for past 10 days. Today she developed fever with temperature 100.7.  In ED lactic acid was 2.30, chest x-ray showed no acute abnormality. Patient was empirically started on vancomycin and Zosyn for possible sepsis. Influenza PCR is ordered and is currently pending. Patient blood pressure is stable 136/63. Did not require fluid boluses.  No nausea vomiting or diarrhea. No dysuria.   Review of systems:    Patient is a poor historian, and with excessive lethargy unable to provide good history of systems.   With Past History of the following :    Past Medical History:  Diagnosis Date  . Abnormal EKG    hx of left anterior fasicular block on 04-09-13 ekg epic  . Allergic rhinitis, cause unspecified   . Bilateral leg weakness   . CAD in native artery   . Chronic gastritis 03/09/11   egd by Dr. Gala Romney  . Chronic shoulder pain   .  Cirrhosis ([place])   . Cirrhosis of liver (HCC)    NASH, afp on 06/17/12 =3.7, per pt she had hep B vaccines in 1996, hep A in process.   . Colon adenomas 03/08/10   tcs by Dr. Gala Romney  . Diverticula of colon 03/08/10   L side  . Esophagitis, erosive 03/08/10  . GERD (gastroesophageal reflux disease)   . History of hemorrhoids 03/08/10   tcs- internal and external  . Hyperplastic polyps of stomach 03/08/10   tcs by Dr. Dudley Major  . Intrinsic asthma, unspecified   . Obesity, unspecified   . Other and unspecified hyperlipidemia   . Pancreatitis   . Pancytopenia, acquired ([place]) 04/12/2014  . Personal history of neurosis   . Thrombocytopenia ([place]) 04/12/2014  . Thrombocytopenia, unspecified   . Type II or unspecified type diabetes mellitus without mention of complication, not stated as uncontrolled   . Unspecified essential hypertension   . Unspecified fall   . Unspecified hereditary and idiopathic peripheral neuropathy   . Vertigo   . Wears glasses  Past Surgical History:  Procedure Laterality Date  . ABDOMINAL HYSTERECTOMY    . BREAST SURGERY Right few yrs ago   benign area removed  . CARDIAC CATHETERIZATION  02/25/2007   Dr. [name]:  normal LV systolic function, mild irregularities of LAD  . COLONOSCOPY  03/08/10   Dr. [name]-->ext/int hemorrhoids, anal paipilla, rectal polyps, desc polyps, cecal polyp, left-sided diverticula. suboptiomal prep. next TCS 02/2015. multiple adenomas  . ESOPHAGOGASTRODUODENOSCOPY  03/08/10   Dr. [name] erosive RE, small hh, antral erosions, small 1cm are of mucosal indentation along gastric body of doubtful significance, cystic nodularity of hypophyarynx, base of tongue, base of epiglottis. benign gastric biopsies  . ESOPHAGOGASTRODUODENOSCOPY  10/08/2011   Dr. [name] esophageal varices, antral erosion. next egd 09/2013  . ESOPHAGOGASTRODUODENOSCOPY N/A 02/17/2014   Dr. Gala Romney: portal gastropathy, no varices. Due for surveillance 2017  . EYE SURGERY      cataracts bilateral  . FOOT SURGERY  2003   lt foot  . hysterectomy and btl     s/p  . ORIF HUMERUS FRACTURE Right 02/17/2013   Procedure: OPEN REDUCTION INTERNAL FIXATION (ORIF) RIGHT PROXIMAL HUMERUS FRACTURE;  Surgeon: [name], MD;  Location: [facility];  Service: Orthopedics;  Laterality: Right;  . PARTIAL GASTRECTOMY     family denies  . TUBAL LIGATION    . TUMOR EXCISION  2003   rt arm and left foot      Social History:      Social History  Substance Use Topics  . Smoking status: Never Smoker  . Smokeless tobacco: Never Used  . Alcohol use No       Family History :     Family History  Problem Relation Age of Onset  . Coronary artery disease      FH  . Diabetes      FH  . Heart disease Mother   . Colon cancer Neg Hx       Home Medications:   Prior to Admission medications   Medication Sig Start Date End Date Taking? Authorizing Provider  ALPRAZolam Duanne Moron) 0.5 MG tablet Take 1 tablet (0.5 mg total) by mouth 2 (two) times daily. 09/30/16  Yes Rexene Alberts, MD  aspirin EC 81 MG tablet Take 81 mg by mouth daily.   Yes Historical Provider, MD  budesonide-formoterol (SYMBICORT) 160-4.5 MCG/ACT inhaler Inhale 2 puffs into the lungs at bedtime. 08/06/16  Yes Kathie Dike, MD  diphenhydrAMINE (BENADRYL) 25 MG tablet Take 25 mg by mouth every 6 (six) hours as needed.   Yes Historical Provider, MD  donepezil (ARICEPT) 5 MG tablet Take 5 mg by mouth at bedtime.   Yes Historical Provider, MD  enalapril (VASOTEC) 5 MG tablet Take 1 tablet by mouth daily. 08/25/14  Yes Historical Provider, MD  furosemide (LASIX) 20 MG tablet Take 2 tablets (40 mg total) by mouth daily. Patient taking differently: Take 20 mg by mouth every morning.  10/23/16  Yes Nishant Dhungel, MD  HUMALOG KWIKPEN 100 UNIT/ML KiwkPen INJECT 10-16 UNITS INTO THE SKIN THREE TIMES DAILY 10/08/16  Yes Cassandria Anger, MD  lactulose (CHRONULAC) 10 GM/15ML solution Take 20 g by mouth 3 (three) times  daily.  10/20/14  Yes Historical Provider, MD  LEVEMIR FLEXTOUCH 100 UNIT/ML Pen INJECT 50 UNITS INTO THE SKIN DAILY AT 10PM Patient taking differently: INJECT 40 UNITS INTO THE SKIN DAILY AT 10PM 12/31/16  Yes Cassandria Anger, MD  meclizine (ANTIVERT) 25 MG tablet Take 25 mg by mouth 3 (three) times  daily as needed for dizziness.  10/17/16  Yes Historical Provider, MD  NEXIUM 40 MG capsule Take 40 mg by mouth every morning.  06/17/14  Yes Historical Provider, MD  oxycodone (OXY-IR) 5 MG capsule Take 5 mg by mouth every 4 (four) hours as needed for pain.   Yes Historical Provider, MD  pregabalin (LYRICA) 100 MG capsule Take 100 mg by mouth 2 (two) times daily.   Yes Historical Provider, MD  rifaximin (XIFAXAN) 550 MG TABS tablet Take 1 tablet (550 mg total) by mouth 2 (two) times daily. Patient taking differently: Take 550 mg by mouth 3 (three) times daily.  06/28/14  Yes Kathie Dike, MD  spironolactone (ALDACTONE) 50 MG tablet TAKE ONE TABLET BY MOUTH ONCE DAILY IN THE MORNING 03/19/16  Yes Mahala Menghini, PA-C  triamcinolone cream (KENALOG) 0.1 % Apply 1 application topically daily as needed (for irritation).  06/26/13  Yes Historical Provider, MD  ursodiol (URSO FORTE) 500 MG tablet Take 500 mg by mouth 2 (two) times daily.   Yes Historical Provider, MD  Vitamin D, Ergocalciferol, (DRISDOL) 50000 units CAPS capsule Take 50,000 Units by mouth every Wednesday.   Yes Historical Provider, MD  [facility] VERIO test strip USE UP TO FOUR TIMES DAILY AS DIRECTED 07/02/16   Cassandria Anger, MD     Allergies:     Allergies  Allergen Reactions  . Morphine And Related Itching  . Compazine [Prochlorperazine Edisylate] Anxiety    Causes anxiety & nervousness     Physical Exam:   Vitals  Blood pressure 139/63, pulse 86, temperature 100.7 F (38.2 C), temperature source Oral, resp. rate 24, height 5\' 9"  (1.753 m), weight 122.5 kg (270 lb), SpO2 97 %.  1.  General: Lethargic  2.  Psychiatric: Somnolent but arousable, oriented 3  3. Neurologic: Could not be tested as patient is very lethargic and unable to cooperate for neurological examination.  4. Eyes :  anicteric sclerae, moist conjunctivae with no lid lag. PERRLA.  5. ENMT:  Oropharynx clear with moist mucous membranes and good dentition  6. Neck:  supple, no cervical lymphadenopathy appriciated, No thyromegaly  7. Respiratory : Mild tachypnea, scattered wheezing bilaterally.  8. Cardiovascular : RRR, no gallops, rubs or murmurs, chronic 2+ pitting left lower extremity edema  9. Gastrointestinal:  Positive bowel sounds, abdomen soft, non-tender to palpation,no hepatosplenomegaly, no rigidity or guarding       10. Skin:  No cyanosis, normal texture and turgor, no rash, lesions or ulcers  11.Musculoskeletal:  Good muscle tone,  joints appear normal , no effusions,  normal range of motion    Data Review:    CBC  Recent Labs Lab 01/22/17 2005  WBC 9.0  HGB 13.2  HCT 39.2  PLT 176  MCV 83.4  MCH 28.1  MCHC 33.7  RDW 16.2*  LYMPHSABS 1.2  MONOABS 0.8  EOSABS 0.1  BASOSABS 0.0   ------------------------------------------------------------------------------------------------------------------  Chemistries   Recent Labs Lab 01/22/17 2005  NA 138  K 3.8  CL 106  CO2 23  GLUCOSE 196*  BUN 16  CREATININE 0.70  CALCIUM 8.3*  AST 20  ALT 12*  ALKPHOS 119  BILITOT 1.9*   ------------------------------------------------------------------------------------------------------------------  ------------------------------------------------------------------------------------------------------------------ GFR: Estimated Creatinine Clearance: 87.7 mL/min (by C-G formula based on SCr of 0.7 mg/dL). Liver Function Tests:  Recent Labs Lab 01/22/17 2005  AST 20  ALT 12*  ALKPHOS 119  BILITOT 1.9*  PROT 6.1*  ALBUMIN 3.0*   No results for input(s): LIPASE,  AMYLASE in the last  168 hours.  Recent Labs Lab 01/22/17 2005  AMMONIA 38*       Imaging Results:    Dg Chest Portable 1 View  Result Date: 01/22/2017 CLINICAL DATA:  Code sepsis, Pt brought in by [facility] for c/o increased congestion and fever for the last couple of days; EXAM: PORTABLE CHEST 1 VIEW COMPARISON:  10/19/2016 FINDINGS: Oblique, minimally motion degraded lateral view. Proximal right humerus fixation. Moderate cardiomegaly. No right-sided pleural fluid. Left pleural space not well evaluated. No pneumothorax. No overt congestive failure. Low lung volumes with resultant pulmonary interstitial prominence. Mild right base volume loss and subsegmental atelectasis. Left lung base not well evaluated. IMPRESSION: Cardiomegaly and low lung volumes. Multifactorial degradation, with the inferior left hemithorax especially not well evaluated. Given this factor, no definite acute disease. Electronically Signed   By: Abigail Miyamoto M.D.   On: 01/22/2017 20:08    My personal review of EKG: Rhythm NSR   Assessment & Plan:    Active Problems:   Liver cirrhosis secondary to NASH [facility])   Essential hypertension   Left-sided weakness   Diabetes mellitus with neuropathy (HCC)   Leg edema, left   Sepsis ([place])   Bronchitis   1. ? SIRS- unclear source of infection, patient empirically started on vancomycin and Zosyn. Lactic acid is 2.30. Follow blood culture. Will obtain UA and urine culture. Influenza PCR is ordered and is currently pending. 2. Acute bronchitis versus COPD exacerbation- patient was never diagnosed with COPD but she takes Symbicort at home, has been coughing up yellow colored phlegm for past 2 weeks. We'll start Solu-Medrol 60 g IV every 6 hours, DuoNeb nebulizers every 6 hours, Mucinex 1 tablet by mouth twice a day. 3. Diabetes mellitus- hold Levemir, will start sliding scale insulin with NovoLog. 4. Chronic left lower extremity edema- stable. Lasix is currently on hold. 5. Liver cirrhosis due to  NASH- LFTs are stable, ammonia level 38. Continue lactulose, rifaximin, Aldactone. Will hold Lasix at this time. 6. Hypertension- blood pressure stable, continue Aldactone, Vasotec.   DVT Prophylaxis-   Lovenox   AM Labs Ordered, also please review Full Orders  Family Communication: Admission, patients condition and plan of care including tests being ordered have been discussed with the patient,  who indicate understanding and agree with the plan and Code Status.  Code Status:  Full code  Admission status: Observation    Time spent in minutes : 60 minutes   LAMA,GAGAN S M.D on 01/22/2017 at 9:49 PM  Between 7am to 7pm - Pager - [phone]. After 7pm go to [email or web address] - password [facility]  [facility] - Office  [phone]

## 2017-01-23 DIAGNOSIS — K5730 Diverticulosis of large intestine without perforation or abscess without bleeding: Secondary | ICD-10-CM | POA: Diagnosis present

## 2017-01-23 DIAGNOSIS — F419 Anxiety disorder, unspecified: Secondary | ICD-10-CM | POA: Diagnosis present

## 2017-01-23 DIAGNOSIS — Z8719 Personal history of other diseases of the digestive system: Secondary | ICD-10-CM | POA: Diagnosis not present

## 2017-01-23 DIAGNOSIS — K7581 Nonalcoholic steatohepatitis (NASH): Secondary | ICD-10-CM | POA: Diagnosis present

## 2017-01-23 DIAGNOSIS — R279 Unspecified lack of coordination: Secondary | ICD-10-CM | POA: Diagnosis not present

## 2017-01-23 DIAGNOSIS — J9601 Acute respiratory failure with hypoxia: Secondary | ICD-10-CM

## 2017-01-23 DIAGNOSIS — K7460 Unspecified cirrhosis of liver: Secondary | ICD-10-CM | POA: Diagnosis present

## 2017-01-23 DIAGNOSIS — J209 Acute bronchitis, unspecified: Secondary | ICD-10-CM | POA: Diagnosis present

## 2017-01-23 DIAGNOSIS — K219 Gastro-esophageal reflux disease without esophagitis: Secondary | ICD-10-CM | POA: Diagnosis present

## 2017-01-23 DIAGNOSIS — J189 Pneumonia, unspecified organism: Secondary | ICD-10-CM | POA: Diagnosis not present

## 2017-01-23 DIAGNOSIS — I119 Hypertensive heart disease without heart failure: Secondary | ICD-10-CM | POA: Diagnosis present

## 2017-01-23 DIAGNOSIS — Z7401 Bed confinement status: Secondary | ICD-10-CM | POA: Diagnosis not present

## 2017-01-23 DIAGNOSIS — I69354 Hemiplegia and hemiparesis following cerebral infarction affecting left non-dominant side: Secondary | ICD-10-CM | POA: Diagnosis not present

## 2017-01-23 DIAGNOSIS — J309 Allergic rhinitis, unspecified: Secondary | ICD-10-CM | POA: Diagnosis present

## 2017-01-23 DIAGNOSIS — K317 Polyp of stomach and duodenum: Secondary | ICD-10-CM | POA: Diagnosis present

## 2017-01-23 DIAGNOSIS — E1142 Type 2 diabetes mellitus with diabetic polyneuropathy: Secondary | ICD-10-CM | POA: Diagnosis present

## 2017-01-23 DIAGNOSIS — Z90710 Acquired absence of both cervix and uterus: Secondary | ICD-10-CM | POA: Diagnosis not present

## 2017-01-23 DIAGNOSIS — M150 Primary generalized (osteo)arthritis: Secondary | ICD-10-CM | POA: Diagnosis not present

## 2017-01-23 DIAGNOSIS — J45909 Unspecified asthma, uncomplicated: Secondary | ICD-10-CM | POA: Diagnosis not present

## 2017-01-23 DIAGNOSIS — G609 Hereditary and idiopathic neuropathy, unspecified: Secondary | ICD-10-CM | POA: Diagnosis present

## 2017-01-23 DIAGNOSIS — Z833 Family history of diabetes mellitus: Secondary | ICD-10-CM | POA: Diagnosis not present

## 2017-01-23 DIAGNOSIS — E785 Hyperlipidemia, unspecified: Secondary | ICD-10-CM | POA: Diagnosis present

## 2017-01-23 DIAGNOSIS — R7881 Bacteremia: Secondary | ICD-10-CM | POA: Diagnosis not present

## 2017-01-23 DIAGNOSIS — E669 Obesity, unspecified: Secondary | ICD-10-CM | POA: Diagnosis present

## 2017-01-23 DIAGNOSIS — K2950 Unspecified chronic gastritis without bleeding: Secondary | ICD-10-CM | POA: Diagnosis present

## 2017-01-23 DIAGNOSIS — F0390 Unspecified dementia without behavioral disturbance: Secondary | ICD-10-CM | POA: Diagnosis present

## 2017-01-23 DIAGNOSIS — R600 Localized edema: Secondary | ICD-10-CM | POA: Diagnosis present

## 2017-01-23 DIAGNOSIS — Z8249 Family history of ischemic heart disease and other diseases of the circulatory system: Secondary | ICD-10-CM | POA: Diagnosis not present

## 2017-01-23 DIAGNOSIS — I2510 Atherosclerotic heart disease of native coronary artery without angina pectoris: Secondary | ICD-10-CM | POA: Diagnosis present

## 2017-01-23 LAB — GLUCOSE, CAPILLARY
GLUCOSE-CAPILLARY: 289 mg/dL — AB (ref 65–99)
GLUCOSE-CAPILLARY: 373 mg/dL — AB (ref 65–99)
Glucose-Capillary: 222 mg/dL — ABNORMAL HIGH (ref 65–99)
Glucose-Capillary: 281 mg/dL — ABNORMAL HIGH (ref 65–99)
Glucose-Capillary: 302 mg/dL — ABNORMAL HIGH (ref 65–99)

## 2017-01-23 LAB — COMPREHENSIVE METABOLIC PANEL
ALBUMIN: 2.7 g/dL — AB (ref 3.5–5.0)
ALT: 12 U/L — ABNORMAL LOW (ref 14–54)
ANION GAP: 8 (ref 5–15)
AST: 20 U/L (ref 15–41)
Alkaline Phosphatase: 108 U/L (ref 38–126)
BUN: 15 mg/dL (ref 6–20)
CHLORIDE: 105 mmol/L (ref 101–111)
CO2: 21 mmol/L — AB (ref 22–32)
Calcium: 8 mg/dL — ABNORMAL LOW (ref 8.9–10.3)
Creatinine, Ser: 0.58 mg/dL (ref 0.44–1.00)
GFR calc non Af Amer: >60 mL/min (ref >60)
GLUCOSE: 288 mg/dL — AB (ref 65–99)
POTASSIUM: 4 mmol/L (ref 3.5–5.1)
SODIUM: 134 mmol/L — AB (ref 135–145)
Total Bilirubin: 1.8 mg/dL — ABNORMAL HIGH (ref 0.3–1.2)
Total Protein: 5.5 g/dL — ABNORMAL LOW (ref 6.5–8.1)

## 2017-01-23 LAB — CBC
HCT: 35.8 % — ABNORMAL LOW (ref 36.0–46.0)
HEMOGLOBIN: 12.1 g/dL (ref 12.0–15.0)
MCH: 28.3 pg (ref 26.0–34.0)
MCHC: 33.8 g/dL (ref 30.0–36.0)
MCV: 83.6 fL (ref 78.0–100.0)
PLATELETS: 141 10*3/uL — AB (ref 150–400)
RBC: 4.28 MIL/uL (ref 3.87–5.11)
RDW: 16.3 % — ABNORMAL HIGH (ref 11.5–15.5)
WBC: 6.2 10*3/uL (ref 4.0–10.5)

## 2017-01-23 LAB — LACTIC ACID, PLASMA
LACTIC ACID, VENOUS: 1.7 mmol/L (ref 0.5–1.9)
LACTIC ACID, VENOUS: 1.7 mmol/L (ref 0.5–1.9)

## 2017-01-23 MED ORDER — IPRATROPIUM-ALBUTEROL 0.5-2.5 (3) MG/3ML IN SOLN
3.0000 mL | Freq: Four times a day (QID) | RESPIRATORY_TRACT | Status: DC
  Administered 2017-01-23 – 2017-01-25 (×12): 3 mL via RESPIRATORY_TRACT
  Filled 2017-01-23 (×12): qty 3

## 2017-01-23 MED ORDER — VANCOMYCIN HCL 10 G IV SOLR
1500.0000 mg | INTRAVENOUS | Status: DC
  Filled 2017-01-23: qty 1500

## 2017-01-23 MED ORDER — INSULIN DETEMIR 100 UNIT/ML FLEXPEN: 20.0000 [IU] | PEN_INJECTOR | Freq: Every day | SUBCUTANEOUS | Status: DC

## 2017-01-23 MED ORDER — VANCOMYCIN HCL IN DEXTROSE 1-5 GM/200ML-% IV SOLN
1000.0000 mg | Freq: Once | INTRAVENOUS | Status: AC
  Administered 2017-01-23: 1000 mg via INTRAVENOUS
  Filled 2017-01-23: qty 200

## 2017-01-23 MED ORDER — PIPERACILLIN-TAZOBACTAM 3.375 G IVPB
3.3750 g | Freq: Three times a day (TID) | INTRAVENOUS | Status: DC
  Administered 2017-01-23 – 2017-01-26 (×10): 3.375 g via INTRAVENOUS
  Filled 2017-01-23 (×9): qty 50

## 2017-01-23 MED ORDER — METHYLPREDNISOLONE SODIUM SUCC 125 MG IJ SOLR: 60.0000 mg | INTRAMUSCULAR | Status: DC

## 2017-01-23 MED ORDER — INSULIN DETEMIR 100 UNIT/ML ~~LOC~~ SOLN
20.0000 [IU] | Freq: Every day | SUBCUTANEOUS | Status: DC
Start: 2017-01-23 — End: 2017-01-24
  Administered 2017-01-23 – 2017-01-24 (×2): 20 [IU] via SUBCUTANEOUS
  Filled 2017-01-23 (×3): qty 0.2

## 2017-01-23 NOTE — Progress Notes (Addendum)
ANTIBIOTIC CONSULT NOTE-Preliminary  Pharmacy Consult for vancomcyin, zosyn Indication: sepsis  Allergies  Allergen Reactions  . Morphine And Related Itching  . Compazine [Prochlorperazine Edisylate] Anxiety    Causes anxiety & nervousness    Patient Measurements: Height: 5\' 9"  (175.3 cm) Weight: 270 lb (122.5 kg) IBW/kg (Calculated) : 66.2 Adjusted Body Weight:   Vital Signs: Temp: 98.9 F (37.2 C) (03/13 2330) Temp Source: Oral (03/13 2330) BP: 148/64 (03/13 2330) Pulse Rate: 77 (03/13 2330)  Labs:  Recent Labs  01/22/17 2005 01/23/17 0228  WBC 9.0 6.2  HGB 13.2 12.1  PLT 176 141*  CREATININE 0.70 0.58    Estimated Creatinine Clearance: 87.7 mL/min (by C-G formula based on SCr of 0.58 mg/dL).  No results for input(s): VANCOTROUGH, VANCOPEAK, VANCORANDOM, GENTTROUGH, GENTPEAK, GENTRANDOM, TOBRATROUGH, TOBRAPEAK, TOBRARND, AMIKACINPEAK, AMIKACINTROU, AMIKACIN in the last 72 hours.   Microbiology: Recent Results (from the past 720 hour(s))  Blood Culture (routine x 2)     Status: None (Preliminary result)   Collection Time: 01/22/17  8:05 PM  Result Value Ref Range Status   Specimen Description RIGHT ANTECUBITAL  Final   Special Requests BOTTLES DRAWN AEROBIC AND ANAEROBIC [facility] EACH  Final   Culture PENDING  Incomplete   Report Status PENDING  Incomplete  Blood Culture (routine x 2)     Status: None (Preliminary result)   Collection Time: 01/22/17  8:10 PM  Result Value Ref Range Status   Specimen Description BLOOD RIGHT HAND  Final   Special Requests BOTTLES DRAWN AEROBIC AND ANAEROBIC [facility] EACH  Final   Culture PENDING  Incomplete   Report Status PENDING  Incomplete    Medical History: Past Medical History:  Diagnosis Date  . Abnormal EKG    hx of left anterior fasicular block on 04-09-13 ekg epic  . Allergic rhinitis, cause unspecified   . Bilateral leg weakness   . CAD in native artery   . Chronic gastritis 03/09/11   egd by Dr. Gala Romney  . Chronic  shoulder pain   . Cirrhosis ([place])   . Cirrhosis of liver (HCC)    NASH, afp on 06/17/12 =3.7, per pt she had hep B vaccines in 1996, hep A in process.   . Colon adenomas 03/08/10   tcs by Dr. Gala Romney  . Diverticula of colon 03/08/10   L side  . Esophagitis, erosive 03/08/10  . GERD (gastroesophageal reflux disease)   . History of hemorrhoids 03/08/10   tcs- internal and external  . Hyperplastic polyps of stomach 03/08/10   tcs by Dr. Dudley Major  . Intrinsic asthma, unspecified   . Obesity, unspecified   . Other and unspecified hyperlipidemia   . Pancreatitis   . Pancytopenia, acquired ([place]) 04/12/2014  . Personal history of neurosis   . Thrombocytopenia ([place]) 04/12/2014  . Thrombocytopenia, unspecified   . Type II or unspecified type diabetes mellitus without mention of complication, not stated as uncontrolled   . Unspecified essential hypertension   . Unspecified fall   . Unspecified hereditary and idiopathic peripheral neuropathy   . Vertigo   . Wears glasses     Medications:  Prescriptions Prior to Admission  Medication Sig Dispense Refill Last Dose  . ALPRAZolam (XANAX) 0.5 MG tablet Take 1 tablet (0.5 mg total) by mouth 2 (two) times daily.   01/22/2017 at Unknown time  . aspirin EC 81 MG tablet Take 81 mg by mouth daily.   01/22/2017 at Unknown time  . budesonide-formoterol (SYMBICORT) 160-4.5 MCG/ACT inhaler Inhale 2  puffs into the lungs at bedtime. 1 Inhaler 12 01/21/2017 at Unknown time  . diphenhydrAMINE (BENADRYL) 25 MG tablet Take 25 mg by mouth every 6 (six) hours as needed.   unknown  . donepezil (ARICEPT) 5 MG tablet Take 5 mg by mouth at bedtime.   01/21/2017 at Unknown time  . enalapril (VASOTEC) 5 MG tablet Take 1 tablet by mouth daily.   01/22/2017 at Unknown time  . furosemide (LASIX) 20 MG tablet Take 2 tablets (40 mg total) by mouth daily. (Patient taking differently: Take 20 mg by mouth every morning. ) 30 tablet 0 01/22/2017 at Unknown time  . HUMALOG KWIKPEN 100 UNIT/ML  KiwkPen INJECT 10-16 UNITS INTO THE SKIN THREE TIMES DAILY 15 mL 0 01/22/2017 at Unknown time  . lactulose (CHRONULAC) 10 GM/15ML solution Take 20 g by mouth 3 (three) times daily.   0 01/22/2017 at Unknown time  . LEVEMIR FLEXTOUCH 100 UNIT/ML Pen INJECT 50 UNITS INTO THE SKIN DAILY AT 10PM (Patient taking differently: INJECT 40 UNITS INTO THE SKIN DAILY AT 10PM) 15 mL 3 01/21/2017 at Unknown time  . meclizine (ANTIVERT) 25 MG tablet Take 25 mg by mouth 3 (three) times daily as needed for dizziness.    unknown  . NEXIUM 40 MG capsule Take 40 mg by mouth every morning.    01/22/2017 at Unknown time  . oxycodone (OXY-IR) 5 MG capsule Take 5 mg by mouth every 4 (four) hours as needed for pain.   unknown  . pregabalin (LYRICA) 100 MG capsule Take 100 mg by mouth 2 (two) times daily.   01/22/2017 at Unknown time  . rifaximin (XIFAXAN) 550 MG TABS tablet Take 1 tablet (550 mg total) by mouth 2 (two) times daily. (Patient taking differently: Take 550 mg by mouth 3 (three) times daily. ) 60 tablet 1 01/22/2017 at Unknown time  . spironolactone (ALDACTONE) 50 MG tablet TAKE ONE TABLET BY MOUTH ONCE DAILY IN THE MORNING 30 tablet 11 01/22/2017 at Unknown time  . triamcinolone cream (KENALOG) 0.1 % Apply 1 application topically daily as needed (for irritation).    unknown  . ursodiol (URSO FORTE) 500 MG tablet Take 500 mg by mouth 2 (two) times daily.   01/22/2017 at Unknown time  . Vitamin D, Ergocalciferol, (DRISDOL) 50000 units CAPS capsule Take 50,000 Units by mouth every Wednesday.   01/16/2017 at Unknown time  . ONETOUCH VERIO test strip USE UP TO FOUR TIMES DAILY AS DIRECTED 150 each 5 09/27/2016 at Unknown time   Scheduled:  . aspirin EC  81 mg Oral Daily  . donepezil  5 mg Oral QHS  . enalapril  5 mg Oral Daily  . enoxaparin (LOVENOX) injection  40 mg Subcutaneous Q24H  . guaiFENesin  600 mg Oral BID  . insulin aspart  0-9 Units Subcutaneous TID WC  . ipratropium-albuterol  3 mL Nebulization Q6H  .  lactulose  20 g Oral TID  . mouth rinse  15 mL Mouth Rinse BID  . methylPREDNISolone (SOLU-MEDROL) injection  60 mg Intravenous Q6H  . pantoprazole  40 mg Oral Daily  . piperacillin-tazobactam (ZOSYN)  IV  3.375 g Intravenous Q8H  . pregabalin  100 mg Oral BID  . rifaximin  550 mg Oral TID  . spironolactone  50 mg Oral Daily  . ursodiol  500 mg Oral BID   Infusions:  . sodium chloride     PRN: ondansetron **OR** ondansetron (ZOFRAN) IV Anti-infectives    Start     Dose/Rate  Route Frequency Ordered Stop   01/23/17 0600  piperacillin-tazobactam (ZOSYN) IVPB 3.375 g     3.375 g 12.5 mL/hr over 240 Minutes Intravenous Every 8 hours 01/23/17 0119     01/23/17 0015  vancomycin (VANCOCIN) IVPB 1000 mg/200 mL premix     1,000 mg 200 mL/hr over 60 Minutes Intravenous  Once 01/23/17 0008 01/23/17 0129   01/22/17 2345  rifaximin (XIFAXAN) tablet 550 mg     550 mg Oral 3 times daily 01/22/17 2344     01/22/17 2000  vancomycin (VANCOCIN) IVPB 1000 mg/200 mL premix     1,000 mg 200 mL/hr over 60 Minutes Intravenous  Once 01/22/17 1948 01/22/17 2142   01/22/17 2000  piperacillin-tazobactam (ZOSYN) IVPB 3.375 g     3.375 g 100 mL/hr over 30 Minutes Intravenous  Once 01/22/17 1948 01/22/17 2212      Assessment: 76 yo female with SIRS with unclear source of infection starting vancomycin and zosyn.   Goal of Therapy:  Vancomycin trough level 15-20 mcg/ml  Plan:  Preliminary review of pertinent patient information completed.  Protocol will be initiated with dose(s) of additional 1 gram vancomycin for 2 gram load and zosyn 3.375grams Q8 hours.  Forestine Na clinical pharmacist will complete review during morning rounds to assess patient and finalize treatment regimen if needed.  Nyra Capes, [facility] 01/23/2017,4:34 AM  Addum:  Cont vancomycin 1500 mg IV q24 hours F/u renal function, cultures and clinical course

## 2017-01-23 NOTE — Progress Notes (Signed)
PROGRESS NOTE  FADIA MARLAR AUQ:[id number] DOB: 1941-09-15 DOA: 01/22/2017 PCP: Purvis Kilts, MD  Brief Narrative: 76 year old woman with multiple medical problems presented with cough, congestion, low-grade fever. Initial evaluation was unrevealing, admitted for SIRS, acute bronchitis and started on empiric antibiotics.  Assessment/Plan 1. Acute hypoxic respiratory failure, suspect secondary to pneumonia as noted below. Continue empiric treatment of pneumonia. 2. Suspected pneumonia clinically. Chest x-ray was unrevealing but a limited study. Her symptomatology suggest pneumonia. Continue empiric treatment of pneumonia. 3. Diabetes mellitus, poor control on steroids. 4. PMH liver cirrhosis, chronic bedbound status, chronic bilateral lower extremity edema, CVA   Seems to be somewhat improved. Narrow antibiotics. Do not see any reason to suspect MRSA. Continue steroids and bronchodilators. Wean oxygen as tolerated.  PT evaluation 3/15  DVT prophylaxis: enoxaparin Code Status: full Family Communication: husband at bedside Disposition Plan: pending     Murray Hodgkins, MD  [facility] Direct contact: [phone] --[address] [place]  --[email or web address]; password [email or web address]  7PM-7AM contact night coverage as above 01/23/2017, 6:07 PM  LOS: 0 days   Consultants:    Procedures:    Antimicrobials: Vancomycin 3/13 >> Zosyn 3/13 >>  Interval history/Subjective: Feels better. Breathing better. Hungry.  Objective: Vitals:   01/23/17 0700 01/23/17 0744 01/23/17 1419 01/23/17 1550  BP: 139/69   125/60  Pulse: 79   79  Resp: 18   16  Temp: 98.6 F (37 C)   98.4 F (36.9 C)  TempSrc: Oral   Oral  SpO2: 94% 94% 93% 98%  Weight:      Height:        Intake/Output Summary (Last 24 hours) at 01/23/17 1807 Last data filed at 01/23/17 1700  Gross per 24 hour  Intake              240 ml  Output                0 ml  Net              240 ml     Filed Weights   01/22/17 1933  Weight: 122.5 kg (270 lb)    Exam:    Constitutional: Appears calm, comfortable. Nontoxic.  Respiratory: Few wheezes. No rhonchi or rales. Mild increased respiratory effort. Cardiovascular regular rate and rhythm. No murmur, rub or gallop. 2+ bilateral lower extremity edema. Left greater than right. Psychiatric. Grossly normal mood and affect. Speech fluent and appropriate. Abdomen. Obese.   I have personally reviewed following labs and imaging studies: Sodium 134, LFTs unremarkable, repeat lactic acid within normal limits. No leukocytosis. Hemoglobin within normal limits. Platelet count 141.  Influenza A negative.  Scheduled Meds: . aspirin EC  81 mg Oral Daily  . donepezil  5 mg Oral QHS  . enalapril  5 mg Oral Daily  . enoxaparin (LOVENOX) injection  40 mg Subcutaneous Q24H  . guaiFENesin  600 mg Oral BID  . insulin aspart  0-9 Units Subcutaneous TID WC  . Insulin Detemir  20 Units Subcutaneous Q2200  . ipratropium-albuterol  3 mL Nebulization Q6H  . lactulose  20 g Oral TID  . mouth rinse  15 mL Mouth Rinse BID  . [START ON 01/24/2017] methylPREDNISolone (SOLU-MEDROL) injection  60 mg Intravenous Q24H  . pantoprazole  40 mg Oral Daily  . piperacillin-tazobactam (ZOSYN)  IV  3.375 g Intravenous Q8H  . pregabalin  100 mg Oral BID  . rifaximin  550 mg Oral TID  . spironolactone  50 mg  Oral Daily  . ursodiol  600 mg Oral BID   Continuous Infusions: . sodium chloride      Principal Problem:   Pneumonia Active Problems:   Liver cirrhosis secondary to NASH (HCC)   Diabetes mellitus with neuropathy ([place])   Bronchitis   Acute hypoxemic respiratory failure ([place])   LOS: 0 days

## 2017-01-23 NOTE — Progress Notes (Signed)
Notifeied Mid level of the patients blood cultures being positive for gram positive cocci.

## 2017-01-23 NOTE — Care Management Important Message (Signed)
Important Message  Patient Details  Name: [name] [name] MRN: [id number] Date of Birth: Jan 14, 1941   Medicare Important Message Given:  Yes    Sherald Barge, RN 01/23/2017, 2:22 PM

## 2017-01-23 NOTE — ACP (Advance Care Planning) (Signed)
Patient was sleeping when I brought Advance Directive information. Left my contact name and number with the information. Will follow up 01/24/17.

## 2017-01-23 NOTE — Progress Notes (Addendum)
Inpatient Diabetes Program Recommendations  AACE/ADA: New Consensus Statement on Inpatient Glycemic Control (2015)  Target Ranges:  Prepandial:   less than 140 mg/dL      Peak postprandial:   less than 180 mg/dL (1-2 hours)      Critically ill patients:  140 - 180 mg/dL   Results for [name] [name], [name] [name] (MRN [id number]) as of 01/23/2017 10:16  Ref. Range 01/23/2017 01:46 01/23/2017 08:08  Glucose-Capillary Latest Ref Range: 65 - 99 mg/dL 222 (H) 289 (H)    Admit with: SIRS/ Fever  History: DM, Dementia, Cirrhosis, CVA  Home DM Meds: Levemir 40 units QHS       Humalog 10-16 units TID  Current Insulin Orders: Novolog Sensitive Correction Scale/ SSI (0-9 units) TID AC       MD- Note Novolog SSI to start this AM.  CBG this AM: 289 mg/dl.  Also note patient getting Solumedrol 60 mg Q6 hours.  Please consider starting a portion of pt's home dose of Levemir:  Recommend Levemir 20 units QHS to start (50% home dose)     --Will follow patient during hospitalization--  Wyn Quaker RN, MSN, CDE Diabetes Coordinator Inpatient Glycemic Control Team Team Pager: [phone] (8a-5p)

## 2017-01-24 DIAGNOSIS — R7881 Bacteremia: Secondary | ICD-10-CM

## 2017-01-24 LAB — GLUCOSE, CAPILLARY
GLUCOSE-CAPILLARY: 343 mg/dL — AB (ref 65–99)
Glucose-Capillary: 289 mg/dL — ABNORMAL HIGH (ref 65–99)
Glucose-Capillary: 280 mg/dL — ABNORMAL HIGH (ref 65–99)
Glucose-Capillary: 216 mg/dL — ABNORMAL HIGH (ref 65–99)
Glucose-Capillary: 173 mg/dL — ABNORMAL HIGH (ref 65–99)

## 2017-01-24 LAB — URINE CULTURE: Culture: NO GROWTH

## 2017-01-24 LAB — HEMOGLOBIN A1C
Hgb A1c MFr Bld: 5.6 % (ref 4.8–5.6)
MEAN PLASMA GLUCOSE: 114 mg/dL

## 2017-01-24 MED ORDER — INSULIN ASPART 100 UNIT/ML ~~LOC~~ SOLN
0.0000 [IU] | Freq: Every day | SUBCUTANEOUS | Status: DC
  Administered 2017-01-25: 4 [IU] via SUBCUTANEOUS

## 2017-01-24 MED ORDER — SODIUM CHLORIDE 0.9 % IV SOLN
1500.0000 mg | INTRAVENOUS | Status: DC
  Administered 2017-01-24 – 2017-01-25 (×2): 1500 mg via INTRAVENOUS
  Filled 2017-01-24 (×5): qty 1500

## 2017-01-24 MED ORDER — INSULIN ASPART 100 UNIT/ML ~~LOC~~ SOLN
0.0000 [IU] | Freq: Three times a day (TID) | SUBCUTANEOUS | Status: DC
  Administered 2017-01-24: 7 [IU] via SUBCUTANEOUS
  Administered 2017-01-25: 11 [IU] via SUBCUTANEOUS
  Administered 2017-01-25: 3 [IU] via SUBCUTANEOUS
  Administered 2017-01-25: 11 [IU] via SUBCUTANEOUS
  Administered 2017-01-26 (×2): 7 [IU] via SUBCUTANEOUS

## 2017-01-24 MED ORDER — INSULIN DETEMIR 100 UNIT/ML ~~LOC~~ SOLN
25.0000 [IU] | Freq: Every day | SUBCUTANEOUS | Status: DC
  Administered 2017-01-25 – 2017-01-26 (×2): 25 [IU] via SUBCUTANEOUS
  Filled 2017-01-24 (×4): qty 0.25

## 2017-01-24 MED ORDER — INSULIN ASPART 100 UNIT/ML ~~LOC~~ SOLN
5.0000 [IU] | Freq: Once | SUBCUTANEOUS | Status: AC
Start: 2017-01-24 — End: 2017-01-24
  Administered 2017-01-24: 5 [IU] via SUBCUTANEOUS

## 2017-01-24 MED ORDER — ENOXAPARIN SODIUM 60 MG/0.6ML ~~LOC~~ SOLN
60.0000 mg | SUBCUTANEOUS | Status: DC
  Administered 2017-01-24 – 2017-01-25 (×2): 60 mg via SUBCUTANEOUS
  Filled 2017-01-24 (×2): qty 0.6

## 2017-01-24 MED ORDER — PREDNISONE 20 MG PO TABS
40.0000 mg | ORAL_TABLET | Freq: Every day | ORAL | Status: DC
  Administered 2017-01-25 – 2017-01-26 (×2): 40 mg via ORAL
  Filled 2017-01-24 (×2): qty 2

## 2017-01-24 MED ORDER — INSULIN ASPART 100 UNIT/ML ~~LOC~~ SOLN
5.0000 [IU] | Freq: Three times a day (TID) | SUBCUTANEOUS | Status: DC
  Administered 2017-01-25 – 2017-01-26 (×5): 5 [IU] via SUBCUTANEOUS

## 2017-01-24 NOTE — Progress Notes (Signed)
Checked with Ms Sellin about her request for Advance Directives information. Her husband shared they had received the information. They have contact information also if there are any questions moving forward.

## 2017-01-24 NOTE — Care Management Note (Signed)
Case Management Note  Patient Details  Name: [name] [name] MRN: [id number] Date of Birth: 17-Dec-1940  Subjective/Objective:                  Pt admitted with AMS. She is from home, lives with husband and is total care. She has CAP aid and all necessary DME. Pt's husband would like pt placed however STR is not appropriate and husband does not want pt placed with her medicaid. CSW has seen pt/husband. Pt not active with pta but has been in the recent past.  Action/Plan: Plans for return home with resumption of previous arrangement.   Expected Discharge Date:  01/29/17               Expected Discharge Plan:  Home/Self Care  In-House Referral:  Clinical Social Work  Discharge planning Services  CM Consult  Post Acute Care Choice:  NA Choice offered to:  NA  Status of Service:  In process, will continue to follow  Sherald Barge, RN 01/24/2017, 3:22 PM

## 2017-01-24 NOTE — Progress Notes (Signed)
PROGRESS NOTE  [name] [name] TLX:[id number] DOB: July 23, 1941 DOA: 01/22/2017 PCP: Purvis Kilts, MD  Brief Narrative: 76 year old woman with multiple medical problems presented with cough, congestion, low-grade fever. Initial evaluation was unrevealing, admitted for SIRS, acute bronchitis and started on empiric antibiotics.  Assessment/Plan 1. Acute hypoxic respiratory failure, secondary to pneumonia. Gradually improving. Wean oxygen as tolerated. 2. Pneumonia. Clinical diagnosis. Improving with empiric antibiotics. 3. Bacteremia. 1/2 bottles. Suspect contaminant. Continue vancomycin for now. 4. Diabetes mellitus, continues to run high. 5. PMH liver cirrhosis, chronic bedbound status, chronic bilateral lower extremity edema, CVA   Overall better today. Continue antibiotics, follow blood culture data, wean oxygen as tolerated. Increase Levemir add meal coverage NovoLog, add NovoLog coverage at night  DVT prophylaxis: enoxaparin Code Status: full Family Communication: husband at bedside Disposition Plan: pending     Murray Hodgkins, MD  [facility] Direct contact: [phone] --Via [place]  --[email or web address]; password [email or web address]  7PM-7AM contact night coverage as above 01/24/2017, 3:08 PM  LOS: 1 day   Consultants:    Procedures:    Antimicrobials: Vancomycin 3/13 >> Zosyn 3/13 >>  Interval history/Subjective: 1/2 BC with bacteria. Physical therapy recommended return to home environment.  Feeling better today. Breathing better.  Objective: Vitals:   01/24/17 0600 01/24/17 0743 01/24/17 1424 01/24/17 1454  BP: 132/60   (!) 121/50  Pulse: 80   84  Resp: 16   16  Temp: 97.9 F (36.6 C)   97.6 F (36.4 C)  TempSrc: Oral   Oral  SpO2: 96% 95% 96% 100%  Weight:      Height:        Intake/Output Summary (Last 24 hours) at 01/24/17 1508 Last data filed at 01/24/17 1300  Gross per 24 hour  Intake              630 ml  Output                0 ml  Net               630 ml     Filed Weights   01/22/17 1933  Weight: 122.5 kg (270 lb)    Exam: Constitutional. Appears calm, comfortable. Sitting in chair.  Respiratory clear to auscultation bilaterally. No wheezes, rales or rhonchi. normal respiratory effort.  Cardiovascular: Regular rate and rhythm. No murmur, rub or gallop. 2+ bilateral lower extremity edema.  Psychiatric: Grossly normal mood and affect. Speech fluent and appropriate.  I have personally reviewed following labs and imaging studies:  Blood sugars 200-300  Scheduled Meds: . aspirin EC  81 mg Oral Daily  . donepezil  5 mg Oral QHS  . enalapril  5 mg Oral Daily  . enoxaparin (LOVENOX) injection  60 mg Subcutaneous Q24H  . guaiFENesin  600 mg Oral BID  . insulin aspart  0-9 Units Subcutaneous TID WC  . insulin detemir  20 Units Subcutaneous Daily  . ipratropium-albuterol  3 mL Nebulization Q6H  . lactulose  20 g Oral TID  . mouth rinse  15 mL Mouth Rinse BID  . methylPREDNISolone (SOLU-MEDROL) injection  60 mg Intravenous Q24H  . pantoprazole  40 mg Oral Daily  . piperacillin-tazobactam (ZOSYN)  IV  3.375 g Intravenous Q8H  . pregabalin  100 mg Oral BID  . rifaximin  550 mg Oral TID  . spironolactone  50 mg Oral Daily  . ursodiol  600 mg Oral BID  . vancomycin  1,500 mg Intravenous Q24H   Continuous  Infusions: . sodium chloride      Principal Problem:   Pneumonia Active Problems:   Liver cirrhosis secondary to NASH ([place])   Diabetes mellitus with neuropathy ([place])   Sepsis ([place])   Bronchitis   Acute hypoxemic respiratory failure ([place])   LOS: 1 day

## 2017-01-24 NOTE — Progress Notes (Signed)
Paged on call doctor to make aware of pts blood sugar 373. Waiting for call back

## 2017-01-24 NOTE — Progress Notes (Signed)
Inpatient Diabetes Program Recommendations  AACE/ADA: New Consensus Statement on Inpatient Glycemic Control (2015)  Target Ranges:  Prepandial:   less than 140 mg/dL      Peak postprandial:   less than 180 mg/dL (1-2 hours)      Critically ill patients:  140 - 180 mg/dL   Results for [name] [name], [name] [name] (MRN [id number]) as of 01/24/2017 09:31  Ref. Range 01/23/2017 08:08 01/23/2017 11:19 01/23/2017 15:52 01/24/2017 00:00 01/24/2017 05:52 01/24/2017 08:01  Glucose-Capillary Latest Ref Range: 65 - 99 mg/dL 289 (H) 281 (H) 302 (H) 373 (H) 343 (H) 289 (H)   Review of Glycemic Control  Diabetes history: DM2 Outpatient Diabetes medications: Levemir 40 units QHS, Humalog 10-16 units TID Current orders for Inpatient glycemic control: Levemir 20 units daily, Novolog 0-9 units TID with meals  Inpatient Diabetes Program Recommendations:  Insulin - Basal: Please consider increasing Levemir to 25 units daily. Insulin - Meal Coverage: If steroids are continued, please consider ordering Novolog 5 units TID with meals if patient eats at least 50% of meals, Insulin-Correction: Please consider ordering Novolog bedtime correction scale.  Thanks, Barnie Alderman, RN, MSN, CDE Diabetes Coordinator Inpatient Diabetes Program [phone] (Team Pager from 8am to 5pm)

## 2017-01-24 NOTE — Evaluation (Signed)
Physical Therapy Evaluation Patient Details Name: [name] [name] MRN: [id number] DOB: [birth year] Today's Date: 01/24/2017   History of Present Illness  76 y.o. 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 Patient was seen by PCP around 10 days ago and has since gradually declined. The patient was so lethargic that she was not as responsive as per family members.  Dx: SIRS, and COPD exacerbation.     Clinical Impression  Pt received in bed, dtr and grandson initially present, and husband present at the end of evaluation.  Pt is not a reliable historian, therefore, information received from dtr.  Since December of 2017 she has required total A for dressing, bathing and feeding, as well as total A for transfers from the bed into her lift chair at home.  She has an aide that comes to assist her every day, and also multiple family members that assist her.  During PT evaluation, she required total A for rolling each direction in order to position Maxi move sling, and she was then transferred bed<>chair via maxi move.  She requires Total A for positioning in the chair.  Pt has steadily declined in functional mobility over the last year, and at this time she is not appropriate for continued skilled PT.  She currently requires total A for all levels of functional mobility.  Recommend that she continue with 24/7 supervision/assistance at home with continued aide for assistance.  Will d/c acute care PT at this time.      Follow Up Recommendations Supervision/Assistance - 24 hour    Equipment Recommendations  None recommended by PT    Recommendations for Other Services       Precautions / Restrictions  Precautions Precautions: Fall Precaution Comments: Due to immobility and AMS.   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       Mobility  Bed Mobility Overal bed mobility: Needs Assistance Bed Mobility: Rolling Rolling: Total assist;+2 for physical assistance;+2 for safety/equipment            Transfers Overall transfer level: Needs assistance               General transfer comment: +2 person assist for transfer while maintaining pt safety and managing equipment.   Ambulation/Gait                Stairs            Wheelchair Mobility    Modified Rankin (Stroke Patients Only)       Balance                                             Pertinent Vitals/Pain Pain Assessment: No/denies pain    Home Living   Living Arrangements: Spouse/significant other Available Help at Discharge: Personal care attendant (Aide comes for 8 hrs M-F, and 5 hrs on Sat/Sun.  Her children come to assist during the rest of the day. ) Type of Home: House Home Access: Ramped entrance     Home Layout: One level  Home Equipment: [place] - 2 wheels;Cane - single point;Bedside commode;Wheelchair - manual;Electric scooter;Hospital bed;Grab bars - toilet;Grab bars - tub/shower;Hand held shower head Optician, dispensing and lift chair. )      Prior Function Level of Independence: Needs assistance   Gait / Transfers Assistance Needed: Dtr reports that since her last admission in Dec. 2017 she has not been in the w/c except to go to doctor's appointments.  She states it is painful for the pt to sit in the w/c.  Instead, the family/aide uses the hoyer to lift and carry her over to the lift chair where she will sit for the morning, then takes an afternoon nap in the bed, and family gets her back to the chair via hoyer lift in the PM.    ADL's /  Homemaking Assistance Needed: Pt requires total A for all functional mobility including rolling, transfers, dressing, bathing, and feeding.        Hand Dominance   Dominant Hand: Right    Extremity/Trunk Assessment   Upper Extremity Assessment Upper Extremity Assessment:  (B UE strength and AROM deficits - pt is not able to raise her hands to her mouth, and demonstrates B UE tremors which family state are new, and make it very difficult for pt to self feed.  )    Lower Extremity Assessment Lower Extremity Assessment: Generalized weakness (B LE ataxia with L side weaker that the right.  Grossly 2/5 strength.  )       Communication      Cognition Arousal/Alertness: Awake/alert Behavior During Therapy: WFL for tasks assessed/performed Overall Cognitive Status: Within Functional Limits for tasks assessed                      General Comments General comments (skin integrity, edema, etc.): Family expresses that she cannot sit on the EOB because of her balance, and she will slide to the floor.    Exercises     Assessment/Plan    PT Assessment Patent does not need any further PT services  PT Problem List         PT Treatment Interventions      PT Goals (Current goals can be found in the Care Plan section)  Acute Rehab PT Goals Patient Stated Goal: None stated PT Goal Formulation: All assessment and education complete, DC therapy    Frequency     Barriers to discharge        Co-evaluation               End of Session Equipment Utilized During Treatment: Oxygen Activity Tolerance: Patient tolerated treatment well Patient left: in chair;with call bell/phone within reach;with family/visitor present Nurse Communication: Mobility status;Need for lift equipment Elwin Sleight, RN aware of pt's mobility status and location.  Recommend Maxi move for transfers.  ) PT Visit Diagnosis: Muscle weakness (generalized) (M62.81)    Functional Assessment Tool Used: AM-PAC  6 Clicks Basic Mobility;Clinical judgement Functional Limitation: Mobility: Walking and moving around Mobility: Walking and Moving Around Current Status [phone]): 100 percent impaired, limited or restricted Mobility: Walking and Moving Around Goal Status [phone]): 100 percent impaired, limited or restricted Mobility: Walking and Moving Around Discharge Status [phone]): 100 percent impaired, limited or restricted    Time: 7209-4709 PT Time Calculation (min) (ACUTE ONLY): 36 min   Charges:   PT Evaluation $PT Eval Low Complexity: 1 Procedure     PT G Codes:   PT G-Codes **NOT FOR INPATIENT CLASS** Functional Assessment Tool Used:  AM-PAC 6 Clicks Basic Mobility;Clinical judgement Functional Limitation: Mobility: Walking and moving around Mobility: Walking and Moving Around Current Status [phone]): 100 percent impaired, limited or restricted Mobility: Walking and Moving Around Goal Status [phone]): 100 percent impaired, limited or restricted Mobility: Walking and Moving Around Discharge Status [phone]): 100 percent impaired, limited or restricted     Beth Shedrick Sarli, PT, DPT X: [phone]

## 2017-01-24 NOTE — Progress Notes (Signed)
Md on call paged and made aware of pts episode on SVT. Adv HR increased to 140's for 3 seconds and now back to normal.

## 2017-01-25 LAB — GLUCOSE, CAPILLARY
GLUCOSE-CAPILLARY: 123 mg/dL — AB (ref 65–99)
GLUCOSE-CAPILLARY: 265 mg/dL — AB (ref 65–99)
Glucose-Capillary: 235 mg/dL — ABNORMAL HIGH (ref 65–99)
Glucose-Capillary: 348 mg/dL — ABNORMAL HIGH (ref 65–99)

## 2017-01-25 LAB — BASIC METABOLIC PANEL
ANION GAP: 9 (ref 5–15)
BUN: 25 mg/dL — AB (ref 6–20)
CALCIUM: 8.3 mg/dL — AB (ref 8.9–10.3)
CO2: 21 mmol/L — ABNORMAL LOW (ref 22–32)
Chloride: 107 mmol/L (ref 101–111)
Creatinine, Ser: 0.74 mg/dL (ref 0.44–1.00)
GFR calc Af Amer: >60 mL/min (ref >60)
GFR calc non Af Amer: >60 mL/min (ref >60)
GLUCOSE: 147 mg/dL — AB (ref 65–99)
POTASSIUM: 3.1 mmol/L — AB (ref 3.5–5.1)
SODIUM: 137 mmol/L (ref 135–145)

## 2017-01-25 LAB — CULTURE, BLOOD (ROUTINE X 2)

## 2017-01-25 MED ORDER — POTASSIUM CHLORIDE CRYS ER 20 MEQ PO TBCR
40.0000 meq | EXTENDED_RELEASE_TABLET | Freq: Once | ORAL | Status: AC
  Administered 2017-01-25: 40 meq via ORAL
  Filled 2017-01-25: qty 2

## 2017-01-25 NOTE — Care Management Important Message (Signed)
Important Message  Patient Details  Name: [name] [name] MRN: [id number] Date of Birth: 24-Mar-1941   Medicare Important Message Given:  Yes    Sherald Barge, RN 01/25/2017, 1:16 PM

## 2017-01-25 NOTE — Progress Notes (Signed)
PROGRESS NOTE  [name] [name] FVC:[id number] DOB: 1940-11-13 DOA: 01/22/2017 PCP: Purvis Kilts, MD  Brief Narrative: 76 year old woman with multiple medical problems presented with cough, congestion, low-grade fever. Initial evaluation was unrevealing, admitted for SIRS, acute bronchitis and started on empiric antibiotics.  Assessment/Plan 1. Acute hypoxic respiratory failure, secondary to pneumonia. Improving.  Wean oxygen. 2. Pneumonia. Clinical diagnosis. Continues to improve with empiric antibiotics. 3. Bacteremia. Spurious. Isolate is a contaminant. No further treatment indicated.  Discontinue vancomycin. 4. Diabetes mellitus, improved control. 5. PMH liver cirrhosis, chronic bedbound status, chronic bilateral lower extremity edema, CVA   Continues to improve. Wean oxygen. Anticipate changing oral antibiotics and discharging home 3/17.  DVT prophylaxis: enoxaparin Code Status: full Family Communication:  Disposition Plan: Home    Murray Hodgkins, MD  [facility] Direct contact: [phone] --Via amion app [place]  --[email or web address]; password [email or web address]  7PM-7AM contact night coverage as above 01/25/2017, 1:51 PM  LOS: 2 days   Consultants:    Procedures:    Antimicrobials: Vancomycin 3/13 >> 3/16 Zosyn 3/13 >>  Interval history/Subjective: Feels fine. Breathing okay.  Objective: Vitals:   01/25/17 0253 01/25/17 0359 01/25/17 0739 01/25/17 1346  BP:  135/65  131/63  Pulse:  72  77  Resp:  (!) 22  (!) 22  Temp:  97.8 F (36.6 C)  97.5 F (36.4 C)  TempSrc:  Oral  Oral  SpO2: 93% 95% 91% 99%  Weight:      Height:        Intake/Output Summary (Last 24 hours) at 01/25/17 1351 Last data filed at 01/25/17 1347  Gross per 24 hour  Intake             1370 ml  Output                0 ml  Net             1370 ml     Filed Weights   01/22/17 1933  Weight: 122.5 kg (270 lb)    Exam: Constitutional. Appears comfortable, calm.  Respiratory.  Clear to auscultation bilaterally. No wheezes, rales or rhonchi. Normal respiratory effort.  Cardiovascular. Regular rate and rhythm. No murmur, rub or gallop. 2+ bilateral lower extremity edema.  Psychiatric. Grossly normal mood and affect. Speech fluent and appropriate.  I have personally reviewed following labs and imaging studies:  Blood sugars 123-280  Potassium 3.1, remainder BMP unremarkable.  Scheduled Meds: . aspirin EC  81 mg Oral Daily  . donepezil  5 mg Oral QHS  . enalapril  5 mg Oral Daily  . enoxaparin (LOVENOX) injection  60 mg Subcutaneous Q24H  . guaiFENesin  600 mg Oral BID  . insulin aspart  0-20 Units Subcutaneous TID WC  . insulin aspart  0-5 Units Subcutaneous QHS  . insulin aspart  5 Units Subcutaneous TID WC  . insulin detemir  25 Units Subcutaneous Daily  . ipratropium-albuterol  3 mL Nebulization Q6H  . lactulose  20 g Oral TID  . mouth rinse  15 mL Mouth Rinse BID  . pantoprazole  40 mg Oral Daily  . piperacillin-tazobactam (ZOSYN)  IV  3.375 g Intravenous Q8H  . predniSONE  40 mg Oral Q breakfast  . pregabalin  100 mg Oral BID  . rifaximin  550 mg Oral TID  . spironolactone  50 mg Oral Daily  . ursodiol  600 mg Oral BID  . vancomycin  1,500 mg Intravenous Q24H   Continuous Infusions: . sodium chloride  Principal Problem:   Pneumonia Active Problems:   Liver cirrhosis secondary to NASH ([place])   Diabetes mellitus with neuropathy ([place])   Sepsis ([place])   Bronchitis   Acute hypoxemic respiratory failure ([place])   LOS: 2 days

## 2017-01-26 LAB — GLUCOSE, CAPILLARY
GLUCOSE-CAPILLARY: 211 mg/dL — AB (ref 65–99)
Glucose-Capillary: 228 mg/dL — ABNORMAL HIGH (ref 65–99)

## 2017-01-26 MED ORDER — INSULIN DETEMIR 100 UNIT/ML FLEXPEN: PEN_INJECTOR | SUBCUTANEOUS | Status: AC

## 2017-01-26 MED ORDER — FUROSEMIDE 20 MG PO TABS: 20.0000 mg | ORAL_TABLET | Freq: Every morning | ORAL | Status: AC

## 2017-01-26 MED ORDER — ALBUTEROL SULFATE (2.5 MG/3ML) 0.083% IN NEBU: 2.5000 mg | INHALATION_SOLUTION | Freq: Four times a day (QID) | RESPIRATORY_TRACT | 0 refills | Status: AC | PRN

## 2017-01-26 MED ORDER — RIFAXIMIN 550 MG PO TABS: 550.0000 mg | ORAL_TABLET | Freq: Three times a day (TID) | ORAL | Status: AC

## 2017-01-26 MED ORDER — IPRATROPIUM-ALBUTEROL 0.5-2.5 (3) MG/3ML IN SOLN
3.0000 mL | Freq: Three times a day (TID) | RESPIRATORY_TRACT | Status: DC
  Administered 2017-01-26: 3 mL via RESPIRATORY_TRACT
  Filled 2017-01-26: qty 3

## 2017-01-26 MED ORDER — AMOXICILLIN-POT CLAVULANATE 875-125 MG PO TABS: 1.0000 | ORAL_TABLET | Freq: Two times a day (BID) | ORAL | 0 refills | Status: AC

## 2017-01-26 MED ORDER — PREDNISONE 10 MG PO TABS: ORAL_TABLET | ORAL | 0 refills | Status: AC

## 2017-01-26 NOTE — Progress Notes (Signed)
At rest the patient's SpO2 on room air is 94%, RR 18 and BBS noted to be decreased.

## 2017-01-26 NOTE — Discharge Summary (Signed)
Physician Discharge Summary  [name] [name] LZJ:[id number] DOB: 1941/09/16 DOA: 01/22/2017  PCP: Purvis Kilts, MD  Admit date: 01/22/2017 Discharge date: 01/26/2017  Recommendations for Outpatient Follow-up:  1. Resolution of pneumonia. 2. See discussion below, husband requested nebulizer, for now will continue Symbicort, consider budesonide via nebulizer. Nebulizer will be arranged and prescription given for albuterol as needed.    Follow-up Information    Purvis Kilts, MD. Schedule an appointment as soon as possible for a visit in 1 week(s).   Specialty:  Family Medicine Contact information: [address] [place] [address] [phone]          Discharge Diagnoses:  1. Acute hypoxic respiratory failure 2. Pneumonia 3. Diabetes mellitus on insulin 4. Chronic bedbound status  Discharge Condition: Improved Disposition: Home  Diet recommendation: Heart healthy, diabetic diet  Filed Weights   01/22/17 1933  Weight: 122.5 kg (270 lb)    History of present illness:  76 year old woman with multiple medical problems presented with cough, congestion, low-grade fever. Initial evaluation was unrevealing, admitted for SIRS, acute bronchitis and started on empiric antibiotics.  Hospital Course:  Patient was treated empirically for pneumonia with rapid clinical improvement. Hospitalization was uncomplicated, oxygen was weaned successfully and the patient will complete course of oral antibiotics and steroids as an outpatient. Husband reported the patient does not tolerate Symbicort well, "keeps her mouth open and does not inhale the medication". He requested nebulizer machine. Individual issues as below.   1. Acute hypoxic respiratory failure, secondary to pneumonia. Resolved. 2. Pneumonia. Clinical diagnosis. Continues to improve with empiric antibiotics. 3. Bacteremia. Spurious. Isolate is a contaminant. No further treatment indicated. 4. Diabetes  mellitus, stable. 5. PMH liver cirrhosis, chronic bedbound status, chronic bilateral lower extremity edema, CVA  Antimicrobials: Vancomycin 3/13 >> 3/16 Zosyn 3/13 >> 3/17 Augmentin 3/17 >> 3/10   S: Feels well, breathing well. Objective: Temperature 98.5, respirations 20, pulse 67, blood pressure 117/72, SPO2 94% on room air.   Constitutional: Appears calm, comfortable.  Cardiovascular: Regular rate and rhythm. No murmur, rub or gallop. 2+ bilateral lower extremity edema.  Respiratory: Clear to auscultation bilaterally. No wheezes, rales or rhonchi. Normal respiratory effort. Speaks in full sentences.  Psychiatric: Grossly normal mood and affect. Speech fluent and appropriate.  Discharge Instructions  Discharge Instructions    DME Nebulizer machine    Complete by:  As directed    Patient needs a nebulizer to treat with the following condition:  Asthma   Diet - low sodium heart healthy    Complete by:  As directed    Diet Carb Modified    Complete by:  As directed    Discharge instructions    Complete by:  As directed    Call your physician or seek immediate medical attention for shortness of breath, wheezing or worsening of condition.     Allergies as of 01/26/2017      Reactions   Morphine And Related Itching   Compazine [prochlorperazine Edisylate] Anxiety   Causes anxiety & nervousness      Medication List    STOP taking these medications   budesonide-formoterol 160-4.5 MCG/ACT inhaler Commonly known as:  SYMBICORT     TAKE these medications   albuterol (2.5 MG/3ML) 0.083% nebulizer solution Commonly known as:  PROVENTIL Take 3 mLs (2.5 mg total) by nebulization every 6 (six) hours as needed for wheezing or shortness of breath.   ALPRAZolam 0.5 MG tablet Commonly known as:  XANAX Take 1 tablet (0.5 mg  total) by mouth 2 (two) times daily.   amoxicillin-clavulanate 875-125 MG tablet Commonly known as:  AUGMENTIN Take 1 tablet by mouth 2 (two) times  daily.   aspirin EC 81 MG tablet Take 81 mg by mouth daily.   diphenhydrAMINE 25 MG tablet Commonly known as:  BENADRYL Take 25 mg by mouth every 6 (six) hours as needed.   donepezil 5 MG tablet Commonly known as:  ARICEPT Take 5 mg by mouth at bedtime.   enalapril 5 MG tablet Commonly known as:  VASOTEC Take 1 tablet by mouth daily.   furosemide 20 MG tablet Commonly known as:  LASIX Take 1 tablet (20 mg total) by mouth every morning.   HUMALOG KWIKPEN 100 UNIT/ML KiwkPen Generic drug:  insulin lispro INJECT 10-16 UNITS INTO THE SKIN THREE TIMES DAILY   Insulin Detemir 100 UNIT/ML Pen Commonly known as:  LEVEMIR FLEXTOUCH INJECT 40 UNITS INTO THE SKIN DAILY AT 10PM What changed:  See the new instructions.   lactulose 10 GM/15ML solution Commonly known as:  CHRONULAC Take 20 g by mouth 3 (three) times daily.   meclizine 25 MG tablet Commonly known as:  ANTIVERT Take 25 mg by mouth 3 (three) times daily as needed for dizziness.   NEXIUM 40 MG capsule Generic drug:  esomeprazole Take 40 mg by mouth every morning.   ONETOUCH VERIO test strip Generic drug:  glucose blood USE UP TO FOUR TIMES DAILY AS DIRECTED   oxycodone 5 MG capsule Commonly known as:  OXY-IR Take 5 mg by mouth every 4 (four) hours as needed for pain.   predniSONE 10 MG tablet Commonly known as:  DELTASONE Take daily: 20 mg x3 days, then 10 mg x3 days, then stop.   pregabalin 100 MG capsule Commonly known as:  LYRICA Take 100 mg by mouth 2 (two) times daily.   rifaximin 550 MG Tabs tablet Commonly known as:  XIFAXAN Take 1 tablet (550 mg total) by mouth 3 (three) times daily.   spironolactone 50 MG tablet Commonly known as:  ALDACTONE TAKE ONE TABLET BY MOUTH ONCE DAILY IN THE MORNING   triamcinolone cream 0.1 % Commonly known as:  KENALOG Apply 1 application topically daily as needed (for irritation).   URSO FORTE 500 MG tablet Generic drug:  ursodiol Take 500 mg by mouth 2 (two)  times daily.   Vitamin D (Ergocalciferol) 50000 units Caps capsule Commonly known as:  DRISDOL Take 50,000 Units by mouth every Wednesday.            Durable Medical Equipment        Start     Ordered   01/26/17 1121  For home use only DME Nebulizer machine  Once    Question:  Patient needs a nebulizer to treat with the following condition  Answer:  Asthma   01/26/17 1120   01/26/17 0000  DME Nebulizer machine    Question:  Patient needs a nebulizer to treat with the following condition  Answer:  Asthma   01/26/17 1042     Allergies  Allergen Reactions  . Morphine And Related Itching  . Compazine [Prochlorperazine Edisylate] Anxiety    Causes anxiety & nervousness    The results of significant diagnostics from this hospitalization (including imaging, microbiology, ancillary and laboratory) are listed below for reference.    Significant Diagnostic Studies: Dg Chest Portable 1 View  Result Date: 01/22/2017 CLINICAL DATA:  Code sepsis, Pt brought in by [facility] for c/o increased congestion and fever for  the last couple of days; EXAM: PORTABLE CHEST 1 VIEW COMPARISON:  10/19/2016 FINDINGS: Oblique, minimally motion degraded lateral view. Proximal right humerus fixation. Moderate cardiomegaly. No right-sided pleural fluid. Left pleural space not well evaluated. No pneumothorax. No overt congestive failure. Low lung volumes with resultant pulmonary interstitial prominence. Mild right base volume loss and subsegmental atelectasis. Left lung base not well evaluated. IMPRESSION: Cardiomegaly and low lung volumes. Multifactorial degradation, with the inferior left hemithorax especially not well evaluated. Given this factor, no definite acute disease. Electronically Signed   By: Abigail Miyamoto M.D.   On: 01/22/2017 20:08    Microbiology: Recent Results (from the past 240 hour(s))  Blood Culture (routine x 2)     Status: Abnormal   Collection Time: 01/22/17  8:05 PM  Result Value Ref Range  Status   Specimen Description RIGHT ANTECUBITAL  Final   Special Requests BOTTLES DRAWN AEROBIC AND ANAEROBIC [facility] EACH  Final   Culture  Setup Time   Final    GRAM POSITIVE COCCI Gram Stain Report Called to,Read Back By and Verified With: WATKINS, T @ 1522 ON 3.14.18 BY BAYSE,L    Culture (A)  Final    STAPHYLOCOCCUS HOMINIS THE SIGNIFICANCE OF ISOLATING THIS ORGANISM FROM A SINGLE SET OF BLOOD CULTURES WHEN MULTIPLE SETS ARE DRAWN IS UNCERTAIN. PLEASE NOTIFY THE MICROBIOLOGY DEPARTMENT WITHIN ONE WEEK IF SPECIATION AND SENSITIVITIES ARE REQUIRED. Performed at [facility], [place] [address], [place], [place] [address]    Report Status 01/25/2017 FINAL  Final  Blood Culture (routine x 2)     Status: None (Preliminary result)   Collection Time: 01/22/17  8:10 PM  Result Value Ref Range Status   Specimen Description BLOOD RIGHT HAND  Final   Special Requests BOTTLES DRAWN AEROBIC AND ANAEROBIC 6CC EACH  Final   Culture NO GROWTH 3 DAYS  Final   Report Status PENDING  Incomplete  Culture, Urine     Status: None   Collection Time: 01/22/17  9:29 PM  Result Value Ref Range Status   Specimen Description URINE, CLEAN CATCH  Final   Special Requests NONE  Final   Culture   Final    NO GROWTH Performed at [facility], [place] [address] [place], [place] [address]    Report Status 01/24/2017 FINAL  Final     Labs: Basic Metabolic Panel:  Recent Labs Lab 01/22/17 2005 01/23/17 0228 01/25/17 0458  NA 138 134* 137  K 3.8 4.0 3.1*  CL 106 105 107  CO2 23 21* 21*  GLUCOSE 196* 288* 147*  BUN 16 15 25*  CREATININE 0.70 0.58 0.74  CALCIUM 8.3* 8.0* 8.3*   Liver Function Tests:  Recent Labs Lab 01/22/17 2005 01/23/17 0228  AST 20 20  ALT 12* 12*  ALKPHOS 119 108  BILITOT 1.9* 1.8*  PROT 6.1* 5.5*  ALBUMIN 3.0* 2.7*    Recent Labs Lab 01/22/17 2005  AMMONIA 38*   CBC:  Recent Labs Lab 01/22/17 2005 01/23/17 0228  WBC 9.0 6.2  NEUTROABS 6.9  --   HGB  13.2 12.1  HCT 39.2 35.8*  MCV 83.4 83.6  PLT 176 141*     Recent Labs  03/26/16 1456 01/22/17 2005  BNP 59.0 48.0    CBG:  Recent Labs Lab 01/25/17 1148 01/25/17 1630 01/25/17 2036 01/26/17 0744 01/26/17 1122  GLUCAP 235* 265* 348* 228* 211*    Principal Problem:   Pneumonia Active Problems:   Liver cirrhosis secondary to  NASH ([place])   Diabetes mellitus with neuropathy ([place])   Bronchitis   Acute hypoxemic respiratory failure ([place])   Time coordinating discharge: 35 minutes  Signed:  Murray Hodgkins, MD [facility] 01/26/2017, 11:39 AM

## 2017-01-26 NOTE — Progress Notes (Signed)
Discharge instructions given to patient's husband who verbalized understanding, out in stable condition via [facility].

## 2017-01-28 LAB — CULTURE, BLOOD (ROUTINE X 2): CULTURE: NO GROWTH

## 2017-02-05 DIAGNOSIS — B9620 Unspecified Escherichia coli [E. coli] as the cause of diseases classified elsewhere: Secondary | ICD-10-CM | POA: Diagnosis not present

## 2017-02-05 DIAGNOSIS — R6520 Severe sepsis without septic shock: Secondary | ICD-10-CM | POA: Diagnosis not present

## 2017-02-05 DIAGNOSIS — D139 Benign neoplasm of ill-defined sites within the digestive system: Secondary | ICD-10-CM | POA: Diagnosis not present

## 2017-02-05 DIAGNOSIS — N390 Urinary tract infection, site not specified: Secondary | ICD-10-CM | POA: Diagnosis not present

## 2017-02-07 DIAGNOSIS — Z515 Encounter for palliative care: Secondary | ICD-10-CM | POA: Diagnosis not present

## 2017-02-07 DIAGNOSIS — J189 Pneumonia, unspecified organism: Secondary | ICD-10-CM | POA: Diagnosis not present

## 2017-02-07 DIAGNOSIS — K7460 Unspecified cirrhosis of liver: Secondary | ICD-10-CM | POA: Diagnosis not present

## 2017-03-27 ENCOUNTER — Other Ambulatory Visit: Payer: Self-pay | Admitting: Urology

## 2017-03-27 DIAGNOSIS — D3001 Benign neoplasm of right kidney: Secondary | ICD-10-CM

## 2017-03-28 ENCOUNTER — Ambulatory Visit: Payer: Medicare Other | Admitting: "Endocrinology

## 2017-04-12 DEATH — deceased

## 2017-04-26 ENCOUNTER — Other Ambulatory Visit: Payer: Self-pay | Admitting: "Endocrinology
